# Patient Record
Sex: Female | Born: 1940 | Race: White | Hispanic: No | Smoking: Never smoker
Health system: Southern US, Community
[De-identification: ages and names within clinical notes are randomized; demographics above are authoritative.]

## PROBLEM LIST (undated history)

## (undated) DIAGNOSIS — I5032 Chronic diastolic (congestive) heart failure: Secondary | ICD-10-CM

## (undated) DIAGNOSIS — J189 Pneumonia, unspecified organism: Secondary | ICD-10-CM

## (undated) DIAGNOSIS — R002 Palpitations: Secondary | ICD-10-CM

## (undated) DIAGNOSIS — I4891 Unspecified atrial fibrillation: Secondary | ICD-10-CM

## (undated) DIAGNOSIS — I499 Cardiac arrhythmia, unspecified: Secondary | ICD-10-CM

## (undated) DIAGNOSIS — K219 Gastro-esophageal reflux disease without esophagitis: Secondary | ICD-10-CM

## (undated) DIAGNOSIS — I10 Essential (primary) hypertension: Secondary | ICD-10-CM

## (undated) DIAGNOSIS — M719 Bursopathy, unspecified: Secondary | ICD-10-CM

## (undated) DIAGNOSIS — S060X9A Concussion with loss of consciousness of unspecified duration, initial encounter: Secondary | ICD-10-CM

## (undated) DIAGNOSIS — F419 Anxiety disorder, unspecified: Secondary | ICD-10-CM

## (undated) HISTORY — DX: Anxiety disorder, unspecified: F41.9

## (undated) HISTORY — DX: Concussion with loss of consciousness of unspecified duration, initial encounter: S06.0X9A

## (undated) HISTORY — DX: Bursopathy, unspecified: M71.9

## (undated) HISTORY — PX: ESOPHAGOGASTRODUODENOSCOPY: SHX1529

## (undated) HISTORY — DX: Gastro-esophageal reflux disease without esophagitis: K21.9

## (undated) HISTORY — DX: Cardiac arrhythmia, unspecified: I49.9

## (undated) HISTORY — DX: Pneumonia, unspecified organism: J18.9

## (undated) HISTORY — PX: COLONOSCOPY: SHX174

## (undated) HISTORY — DX: Essential (primary) hypertension: I10

## (undated) HISTORY — DX: Palpitations: R00.2

## (undated) HISTORY — DX: Unspecified atrial fibrillation: I48.91

## (undated) HISTORY — PX: NOSE SURGERY: SHX723

---

## 1968-05-23 HISTORY — PX: HERNIA REPAIR: SHX51

## 1978-05-23 HISTORY — PX: KNEE SURGERY: SHX244

## 1993-05-23 HISTORY — PX: TOTAL ABDOMINAL HYSTERECTOMY: SHX209

## 1998-02-19 ENCOUNTER — Other Ambulatory Visit: Admission: RE | Admit: 1998-02-19 | Discharge: 1998-02-19 | Payer: Self-pay | Admitting: *Deleted

## 1998-05-23 HISTORY — PX: SHOULDER SURGERY: SHX246

## 1999-03-18 ENCOUNTER — Other Ambulatory Visit: Admission: RE | Admit: 1999-03-18 | Discharge: 1999-03-18 | Payer: Self-pay | Admitting: *Deleted

## 1999-12-08 ENCOUNTER — Encounter: Admission: RE | Admit: 1999-12-08 | Discharge: 2000-01-27 | Payer: Self-pay | Admitting: Orthopedic Surgery

## 2000-04-28 ENCOUNTER — Other Ambulatory Visit: Admission: RE | Admit: 2000-04-28 | Discharge: 2000-04-28 | Payer: Self-pay | Admitting: *Deleted

## 2001-04-30 ENCOUNTER — Other Ambulatory Visit: Admission: RE | Admit: 2001-04-30 | Discharge: 2001-04-30 | Payer: Self-pay | Admitting: Obstetrics and Gynecology

## 2002-05-21 ENCOUNTER — Other Ambulatory Visit: Admission: RE | Admit: 2002-05-21 | Discharge: 2002-05-21 | Payer: Self-pay | Admitting: Obstetrics and Gynecology

## 2003-06-03 ENCOUNTER — Other Ambulatory Visit: Admission: RE | Admit: 2003-06-03 | Discharge: 2003-06-03 | Payer: Self-pay | Admitting: Obstetrics and Gynecology

## 2006-05-23 HISTORY — PX: ABDOMINAL SURGERY: SHX537

## 2006-09-22 ENCOUNTER — Ambulatory Visit: Payer: Self-pay | Admitting: Internal Medicine

## 2006-11-06 ENCOUNTER — Ambulatory Visit (HOSPITAL_COMMUNITY): Admission: RE | Admit: 2006-11-06 | Discharge: 2006-11-06 | Payer: Self-pay | Admitting: *Deleted

## 2007-07-29 ENCOUNTER — Other Ambulatory Visit: Payer: Self-pay

## 2007-07-29 ENCOUNTER — Inpatient Hospital Stay: Payer: Self-pay | Admitting: Internal Medicine

## 2007-08-14 ENCOUNTER — Ambulatory Visit: Payer: Self-pay | Admitting: Internal Medicine

## 2007-10-08 ENCOUNTER — Ambulatory Visit: Payer: Self-pay | Admitting: Pain Medicine

## 2007-10-17 ENCOUNTER — Ambulatory Visit: Payer: Self-pay | Admitting: Pain Medicine

## 2007-10-25 ENCOUNTER — Ambulatory Visit: Payer: Self-pay | Admitting: Internal Medicine

## 2007-11-15 ENCOUNTER — Ambulatory Visit: Payer: Self-pay | Admitting: Pain Medicine

## 2007-11-19 ENCOUNTER — Ambulatory Visit: Payer: Self-pay | Admitting: Pain Medicine

## 2007-11-22 ENCOUNTER — Ambulatory Visit: Payer: Self-pay | Admitting: Surgery

## 2007-11-22 ENCOUNTER — Other Ambulatory Visit: Payer: Self-pay

## 2009-11-18 ENCOUNTER — Ambulatory Visit: Payer: Self-pay | Admitting: Internal Medicine

## 2010-01-21 ENCOUNTER — Ambulatory Visit: Payer: Self-pay | Admitting: Gastroenterology

## 2010-05-23 DIAGNOSIS — J189 Pneumonia, unspecified organism: Secondary | ICD-10-CM

## 2010-05-23 HISTORY — DX: Pneumonia, unspecified organism: J18.9

## 2010-08-20 ENCOUNTER — Inpatient Hospital Stay (HOSPITAL_COMMUNITY)
Admission: EM | Admit: 2010-08-20 | Discharge: 2010-08-22 | DRG: 310 | Disposition: A | Discharge status: Home / Self Care | Payer: Medicare Other | Attending: Cardiology | Admitting: Cardiology

## 2010-08-20 ENCOUNTER — Emergency Department (HOSPITAL_COMMUNITY): Payer: Medicare Other

## 2010-08-20 DIAGNOSIS — Z888 Allergy status to other drugs, medicaments and biological substances status: Secondary | ICD-10-CM

## 2010-08-20 DIAGNOSIS — R002 Palpitations: Secondary | ICD-10-CM

## 2010-08-20 DIAGNOSIS — I4891 Unspecified atrial fibrillation: Principal | ICD-10-CM | POA: Diagnosis present

## 2010-08-20 DIAGNOSIS — K219 Gastro-esophageal reflux disease without esophagitis: Secondary | ICD-10-CM | POA: Diagnosis present

## 2010-08-20 DIAGNOSIS — Z880 Allergy status to penicillin: Secondary | ICD-10-CM

## 2010-08-20 DIAGNOSIS — M76899 Other specified enthesopathies of unspecified lower limb, excluding foot: Secondary | ICD-10-CM | POA: Diagnosis present

## 2010-08-20 DIAGNOSIS — F411 Generalized anxiety disorder: Secondary | ICD-10-CM | POA: Diagnosis present

## 2010-08-20 DIAGNOSIS — I10 Essential (primary) hypertension: Secondary | ICD-10-CM | POA: Diagnosis present

## 2010-08-20 LAB — DIFFERENTIAL
Basophils Absolute: 0 10*3/uL (ref 0.0–0.1)
Basophils Relative: 0 % (ref 0–1)
Neutro Abs: 3.6 10*3/uL (ref 1.7–7.7)
Neutrophils Relative %: 72 % (ref 43–77)

## 2010-08-20 LAB — COMPREHENSIVE METABOLIC PANEL
ALT: 17 U/L (ref 0–35)
AST: 21 U/L (ref 0–37)
Albumin: 3.1 g/dL — ABNORMAL LOW (ref 3.5–5.2)
CO2: 23 mEq/L (ref 19–32)
Calcium: 8.6 mg/dL (ref 8.4–10.5)
Chloride: 107 mEq/L (ref 96–112)
GFR calc Af Amer: >60 mL/min (ref >60)
GFR calc non Af Amer: >60 mL/min (ref >60)
Sodium: 141 mEq/L (ref 135–145)
Total Bilirubin: 0.8 mg/dL (ref 0.3–1.2)

## 2010-08-20 LAB — CARDIAC PANEL(CRET KIN+CKTOT+MB+TROPI)
Relative Index: INVALID (ref 0.0–2.5)
Troponin I: 0.02 ng/mL (ref 0.00–0.06)

## 2010-08-20 LAB — URINALYSIS, ROUTINE W REFLEX MICROSCOPIC
Bilirubin Urine: NEGATIVE
Hgb urine dipstick: NEGATIVE
Ketones, ur: NEGATIVE mg/dL
Nitrite: NEGATIVE
pH: 5.5 (ref 5.0–8.0)

## 2010-08-20 LAB — CK TOTAL AND CKMB (NOT AT ARMC)
CK, MB: 2 ng/mL (ref 0.3–4.0)
CK, MB: 2.2 ng/mL (ref 0.3–4.0)
Relative Index: INVALID (ref 0.0–2.5)
Total CK: 50 U/L (ref 7–177)
Total CK: 54 U/L (ref 7–177)

## 2010-08-20 LAB — PROTIME-INR
INR: 0.99 (ref 0.00–1.49)
Prothrombin Time: 13.3 seconds (ref 11.6–15.2)

## 2010-08-20 LAB — CBC
Hemoglobin: 12.1 g/dL (ref 12.0–15.0)
Platelets: 182 10*3/uL (ref 150–400)
RBC: 3.85 MIL/uL — ABNORMAL LOW (ref 3.87–5.11)

## 2010-08-20 LAB — TROPONIN I
Troponin I: 0.02 ng/mL (ref 0.00–0.06)
Troponin I: 0.02 ng/mL (ref 0.00–0.06)

## 2010-08-20 LAB — POCT CARDIAC MARKERS
CKMB, poc: <1 ng/mL — ABNORMAL LOW (ref 1.0–8.0)
Myoglobin, poc: 49.7 ng/mL (ref 12–200)
Troponin i, poc: <0.05 ng/mL (ref 0.00–0.09)

## 2010-08-20 LAB — TSH: TSH: 2.6 u[IU]/mL (ref 0.350–4.500)

## 2010-08-21 DIAGNOSIS — I4891 Unspecified atrial fibrillation: Secondary | ICD-10-CM

## 2010-08-21 DIAGNOSIS — I359 Nonrheumatic aortic valve disorder, unspecified: Secondary | ICD-10-CM

## 2010-08-21 LAB — CBC
Hemoglobin: 11.8 g/dL — ABNORMAL LOW (ref 12.0–15.0)
MCH: 31.2 pg (ref 26.0–34.0)
MCH: 32.4 pg (ref 26.0–34.0)
MCV: 91.9 fL (ref 78.0–100.0)
Platelets: 202 10*3/uL (ref 150–400)
Platelets: 194 10*3/uL (ref 150–400)
RBC: 3.78 MIL/uL — ABNORMAL LOW (ref 3.87–5.11)
RDW: 13 % (ref 11.5–15.5)

## 2010-08-21 LAB — CARDIAC PANEL(CRET KIN+CKTOT+MB+TROPI)
CK, MB: 1.5 ng/mL (ref 0.3–4.0)
CK, MB: 1.5 ng/mL (ref 0.3–4.0)
Relative Index: INVALID (ref 0.0–2.5)
Total CK: 50 U/L (ref 7–177)

## 2010-08-21 LAB — BASIC METABOLIC PANEL
CO2: 23 mEq/L (ref 19–32)
Calcium: 8.7 mg/dL (ref 8.4–10.5)
Chloride: 106 mEq/L (ref 96–112)
GFR calc Af Amer: >60 mL/min (ref >60)
Glucose, Bld: 117 mg/dL — ABNORMAL HIGH (ref 70–99)
Potassium: 3.3 mEq/L — ABNORMAL LOW (ref 3.5–5.1)
Sodium: 137 mEq/L (ref 135–145)

## 2010-08-21 LAB — LIPID PANEL
Cholesterol: 222 mg/dL — ABNORMAL HIGH (ref 0–200)
HDL: 42 mg/dL (ref >39)
Triglycerides: 172 mg/dL — ABNORMAL HIGH (ref <150)

## 2010-08-21 LAB — HEPARIN LEVEL (UNFRACTIONATED)
Heparin Unfractionated: 0.56 IU/mL (ref 0.30–0.70)
Heparin Unfractionated: <0.1 IU/mL — ABNORMAL LOW (ref 0.30–0.70)

## 2010-08-22 LAB — CBC
HCT: 34.6 % — ABNORMAL LOW (ref 36.0–46.0)
MCV: 91.8 fL (ref 78.0–100.0)
Platelets: 207 10*3/uL (ref 150–400)
RBC: 3.77 MIL/uL — ABNORMAL LOW (ref 3.87–5.11)
WBC: 4.1 10*3/uL (ref 4.0–10.5)

## 2010-08-30 ENCOUNTER — Ambulatory Visit (HOSPITAL_COMMUNITY): Payer: Medicare Other | Attending: Cardiology | Admitting: Radiology

## 2010-08-30 VITALS — Ht 66.0 in | Wt 176.0 lb

## 2010-08-30 DIAGNOSIS — R0989 Other specified symptoms and signs involving the circulatory and respiratory systems: Secondary | ICD-10-CM | POA: Insufficient documentation

## 2010-08-30 DIAGNOSIS — R0609 Other forms of dyspnea: Secondary | ICD-10-CM | POA: Insufficient documentation

## 2010-08-30 DIAGNOSIS — R079 Chest pain, unspecified: Secondary | ICD-10-CM

## 2010-08-30 DIAGNOSIS — I10 Essential (primary) hypertension: Secondary | ICD-10-CM

## 2010-08-30 DIAGNOSIS — I359 Nonrheumatic aortic valve disorder, unspecified: Secondary | ICD-10-CM | POA: Insufficient documentation

## 2010-08-30 DIAGNOSIS — R002 Palpitations: Secondary | ICD-10-CM | POA: Insufficient documentation

## 2010-08-30 DIAGNOSIS — R0602 Shortness of breath: Secondary | ICD-10-CM | POA: Insufficient documentation

## 2010-08-30 DIAGNOSIS — I4891 Unspecified atrial fibrillation: Secondary | ICD-10-CM | POA: Insufficient documentation

## 2010-08-30 MED ORDER — TECHNETIUM TC 99M TETROFOSMIN IV KIT
11.0000 | PACK | Freq: Once | INTRAVENOUS | Status: AC | PRN
  Administered 2010-08-30: 11 via INTRAVENOUS

## 2010-08-30 MED ORDER — TECHNETIUM TC 99M TETROFOSMIN IV KIT
33.0000 | PACK | Freq: Once | INTRAVENOUS | Status: AC | PRN
  Administered 2010-08-30: 33 via INTRAVENOUS

## 2010-08-30 NOTE — Progress Notes (Addendum)
[facility] SITE 3 NUCLEAR MED [address] [place] [address] [phone]  Cardiology Nuclear Med Study  [name] is a 70 y.o. female [id number] 26-Mar-1941   Nuclear Med Background Indication for Stress Test:  Evaluation for Ischemia, 08/22/10 Post Hospital: Afib with RVR History:  ~10 yrs ago MPS:([facility]) [place] per patient. Cardiac Risk Factors: Hypertension, Adopted  Symptoms:  Chest Pain (last date of chest discomfort :none since discharge), Diaphoresis, Dizziness, DOE, Fatigue, Palpitations and Rapid HR   Nuclear Pre-Procedure Caffeine/Decaff Intake:  None NPO After: 7:00am   Lungs:  Clear IV 0.9% NS with Angio Cath:  20g  IV Site: R Hand  IV Started by:  Stanton Kidney, EMT-P  Chest Size (in):  40 Cup Size: C  Height: 5\' 6"  (1.676 m)  Weight:  176 lb (79.833 kg)  BMI:  Body mass index is 28.41 kg/(m^2). Tech Comments:  Diltiazem held this a.m.    Nuclear Med Study 1 or 2 day study: 1 day  Stress Test Type:  Stress  Reading MD: Cassell Clement, MD  Order Authorizing Provider:  Marca Ancona, MD  Resting Radionuclide: Technetium 47m Tetrofosmin  Resting Radionuclide Dose: 11.0 mCi       Stress Radionuclide:  Technetium 71m Tetrofosmin  Stress Radionuclide Dose: 33.0 mCi           Stress Protocol Rest HR: 92 Stress HR: 133  Rest BP: 159/89 Stress BP: 224/78  Exercise Time (min): 4:01 METS: 5.8   Predicted Max HR: 152 bpm % Max HR: 87.5 bpm Rate Pressure Product: 21308   Dose of Adenosine (mg):  n/a Dose of Lexiscan: n/a mg  Dose of Atropine (mg): n/a Dose of Dobutamine: n/a mcg/kg/min (at max HR)  Stress Test Technologist: Rea College, CMA-N  Nuclear Technologist:  Doyne Keel, CNMT     Rest Procedure:  Myocardial perfusion imaging was performed at rest 45 minutes following the intravenous administration of Technetium 6m Tetrofosmin. Rest ECG: No acute changes.  Stress Procedure:  The patient exercised for 4:01 on the treadmill  utilizing the Bruce protocol.  The patient stopped due to fatigue and denied any chest pain.  There were no diagnostic ST-T wave changes, only an isolated PVC.  She did have a hypertensive response to exercise, 224/78.  Technetium 33m Tetrofosmin was injected at peak exercise and myocardial perfusion imaging was performed after a brief delay. Stress ECG: No significant ST segment change suggestive of ischemia.  QPS Raw Data Images:  Normal; no motion artifact; normal heart/lung ratio. Stress Images:  Normal homogeneous uptake in all areas of the myocardium. Rest Images:  Normal homogeneous uptake in all areas of the myocardium. Subtraction (SDS):  No evidence of ischemia. Transient Ischemic Dilatation (Normal <1.22):  0.95 Lung/Heart Ratio (Normal <0.45):  0.33  Quantitative Gated Spect Images QGS EDV:  80 ml QGS ESV:  25 ml QGS cine images:  NL LV Function; NL Wall Motion QGS EF: 69%  Impression Exercise Capacity:  Poor exercise capacity. BP Response:  Hypertensive blood pressure response. Clinical Symptoms:  No chest pain. ECG Impression:  No significant ST segment change suggestive of ischemia. Comparison with Prior Nuclear Study: No images to compare  Overall Impression:  Low risk stress nuclear study.  No ischemia.  Hypertensive response to exercise.    Signed by Cassell Clement    Hypertensive BP response, otherwise normal study.  Please inform patient.

## 2010-08-31 ENCOUNTER — Telehealth: Payer: Self-pay | Admitting: Cardiology

## 2010-08-31 NOTE — Telephone Encounter (Signed)
Pt was without Cardizem for 24 hours due to a stress test.  Pt feels like she is in A-fib.  Please advise.

## 2010-08-31 NOTE — Progress Notes (Signed)
ROUTED TO DR. MCLEAN.Falecha Clark ° ° °

## 2010-08-31 NOTE — Telephone Encounter (Signed)
Spoke to pt, she had stress test yesterday at [facility] and she had stopped Cardizem for procedure (she takes 360mg  qd). Pt has restarted this at 0730 this am. When pt called her BP/HR was 143/84, 102. Few minutes later 162/90, 99. And @ 0930 168/89, HR 86. Pt states she is sitting down and feels ok at this time. HR seem controlled now, pt has no other symptoms. Pt does take Pradaxa 150mg  b.i.d. Notified pt to monitor BP/HR today and symptoms and if she feels she needs to come in for EKG for incr HR or if she feels worse. Pt ok with this, her husband is with her today and he will drive her here if she wants nurse visit. Otherwise, pt is back on Cardizem and continues to take Pradaxa and will follow up as needed.

## 2010-09-01 NOTE — Progress Notes (Signed)
Pt has an appointment 09/03/10 with Dr Shirlee Latch in the [facility]

## 2010-09-01 NOTE — H&P (Signed)
NAMEAniyla, [name]                   ACCOUNT NO.:  [id number]  MEDICAL RECORD NO.:  [id number]           PATIENT TYPE:  I  LOCATION:  2604                         FACILITY:  [facility]  PHYSICIAN:  Colleen Can. Deborah Chalk, M.D.DATE OF BIRTH:  16-Jan-1941  DATE OF ADMISSION:  08/20/2010 DATE OF DISCHARGE:                             HISTORY & PHYSICAL   PRIMARY CARE PHYSICIAN:  [name], MD at [facility] in [place].  PRIMARY CARDIOLOGIST:  None, she is scheduled to see Dr. Marca Ancona and requests him as her cardiologist because he sees her husband.  CHIEF COMPLAINT:  Palpitations.  HISTORY OF PRESENT ILLNESS:  [name] is a 70 year old female with no history of coronary artery disease.  She has a long history of palpitations managed by Dr. Hyacinth Meeker.  She had sudden onset of tachy palpitations with minimal activity on Tuesday August 17, 2010.  She took her blood pressure on her home machine and her heart rate was 154.  Her blood pressure was maintained.  She took an extra metoprolol as Dr. Hyacinth Meeker had previously instructed her to do when she felt these palpitations and she was able to sleep.  The next morning she was very tired.  She had some dyspnea on exertion and nausea, but no vomiting. She at no point did she have any chest pain.  That p.m., she felt better and Thursday she felt well and felt like she was able to do her normal activities.  She had some occasional brief palpitations, but does not remember feeling any sustained arrhythmia.  Last p.m., she took her evening medication at 9:20 p.m. which was a little bit late.  A short time later, she had a sudden increase in her heart rate.  She was lying in bed and had to sit up.  She had some pain between her scapula.  She also had a headache and some blurred vision, but no unilateral weakness. She had dyspnea on exertion as well.  She took a home dose of Xanax and another small dose of metoprolol.  Her heart rate was once again  in the 150s.  The symptoms were more pronounced than they had been a couple of nights ago and she was unable to sleep.  The pain in her back concerned her.  The headache was also severe.  She called EMS and was transported to the emergency room.  She was in Afib with RVR.  Her heart rate is currently better controlled on Cardizem at 10 mg, but is still frequently in the 120s and 130s.  When she received medication to control her heart rate, both her headache and the pain in her back improved.  She is much more comfortable now.  PAST MEDICAL HISTORY: 1. History of palpitations "lifelong." 2. Hypertension. 3. Gastroesophageal reflux disease. 4. Anxiety. 5. Recent history of upper respiratory infection and pleurisy treated     with 4 rounds of steroids and 3 rounds of antibiotics.  SURGICAL HISTORY:  She is status post hernia repair with mesh, shoulder surgery, hysterectomy, right knee surgery, left-sided hernia repair and EGD with colonoscopy.  ALLERGIES:  She is allergic or intolerant to PENICILLIN, NORVASC, VICODIN AND BETADINE.  CURRENT MEDICATIONS: 1. Prozac 20 mg a day. 2. Toprol-XL 50 mg b.i.d. 3. Doxazosin 4 mg half tablet nightly. 4. Restoril 15 mg nightly. 5. Mirapex 0.25 mg t.i.d. p.r.n. for restless legs. 6. Imitrex 50 mg p.r.n. migraines. 7. Toviaz 4 mg daily. 8. Premarin 0.65 mg daily. 9. Tegretol 100 mg b.i.d. p.r.n. for trigeminal neuralgia. 10.Astelin nasal spray daily. 11.Etodolac 400 mg b.i.d. 12.Nasonex daily. 13.Calcium plus D 2 tablets daily. 14.Vitamin D 1000 mg a day. 15.Xanax dose unknown one and a half to 1 tablet p.r.n. 16.Nexium 40 mg b.i.d. 17.Vitamin C plus iron daily.  SOCIAL HISTORY:  She lives in [place] with her husband who has significant cardiac issues and walks with a walker.  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  She denies any history of alcohol, tobacco or  drug abuse.  FAMILY HISTORY:  She was adopted and both of her adopted parents are deceased.  She has no information on any of her biological relatives.  REVIEW OF SYSTEMS:  Her upper respiratory infection, she feels is resolved.  She has been working on getting her strength back.  She has a small amount of vision loss.  She has problems with anxiety and stress dealing with her husband.  She has joint pains from bursitis in both hips and some other musculoskeletal discomforts.  She has regular reflux symptoms, but has never had melena.  She still coughs occasionally, but the cough is nonproductive.  Full 14-point review of systems is otherwise negative except as stated in the HPI.  PHYSICAL EXAMINATION:  VITAL SIGNS:  Temperature 97.0, blood pressure 142/89, pulse 104, respiratory rate 20, O2 saturation 94% on room air. GENERAL:  She is a well-developed elderly white female in no acute distress. HEENT:  Normal. NECK:  There is no lymphadenopathy, thyromegaly, bruit or JVD noted. CVA:  Heart is rapid and irregular in rate and rhythm with an S1-S2 and no significant murmur, rub, or gallop is noted.  Distal pulses are intact in all 4 extremities. LUNGS:  She has rales in both bases.  There are few crackles as well. SKIN:  No rashes or lesions are noted. ABDOMEN:  Soft, nontender with active bowel sounds. EXTREMITIES:  There is no cyanosis, clubbing or edema noted. MUSCULOSKELETAL:  There is no joint deformity or effusions and no spine or CVA tenderness. NEURO:  She is alert and oriented, with cranial nerves II-XII grossly intact.  Chest x-ray shows enlargement of cardiac silhouette.  She has emphysematous changes with minimal right basilar atelectasis, but lung base opacity is possibly pneumonia and follow up exams are recommended until resolution to exclude underlying abnormalities/mass.  EKG, is atrial fibrillation.  RVR with rate 126 with no acute ischemic changes.  LABORATORY  VALUES:  Hemoglobin 12.1, hematocrit 35.5, WBC 5.0, platelets 182,000, INR 0.99.  Sodium 141, potassium 2.9, chloride 107, CO2 23, BUN 14, creatinine 0.86, glucose 112, total protein 5.8, albumin 3.1.  Other CMET values within normal limits.  CK-MB and troponin I as well as point- of-care markers were negative x1.  BNP 450.  IMPRESSION:  [name] was seen today by Dr. Deborah Chalk, the patient evaluated and the data reviewed. 1. New onset atrial fibrillation with a long history of palpitations     on a beta blocker.  The possible onset was 3 days ago with a 24-     hour period  of a possibly regular rhythm in between.  This suggests     that she is at low risk for cerebrovascular accident.  On review of     telemetry, she has converted to sinus rhythm for 1 or 2 beats with     a prolonged delay for sinus node recovery on telemetry, but almost     immediately went back into atrial fibrillation.  PLAN: 1. Anticoagulate with heparin. 2. IV Cardizem.  If she does not go into sinus rhythm spontaneously,     she will need an antiarrhythmic followed by a TEE, cardioversion on     Monday.  If she goes back and forth on telemetry, she will just     need an antiarrhythmic.  The duration of Afib is short, but symptoms are concerning.  Her potassium has been replaced with 10 mEq IV and 40 p.o.  We will check a 2-D echocardiogram. Her BNP is somewhat elevated, but her blood pressure on the Cardizem drip is slightly less than 100 systolic.  Her heart rate is not optimally controlled at this time.  Her respiratory status will be followed closely and she may need diuresis at some point.     Theodore Demark, PA-C   ______________________________ Colleen Can. Deborah Chalk, M.D.    RB/MEDQ  D:  08/20/2010  T:  08/21/2010  Job:  [id number]  Electronically Signed by Theodore Demark PA-C on 08/25/2010 01:24:36 PM Electronically Signed by Roger Shelter M.D. on 09/01/2010 11:18:51 AM

## 2010-09-03 ENCOUNTER — Ambulatory Visit (INDEPENDENT_AMBULATORY_CARE_PROVIDER_SITE_OTHER): Payer: Medicare Other | Admitting: Cardiology

## 2010-09-03 ENCOUNTER — Encounter: Payer: Self-pay | Admitting: Cardiology

## 2010-09-03 DIAGNOSIS — Z0000 Encounter for general adult medical examination without abnormal findings: Secondary | ICD-10-CM

## 2010-09-03 DIAGNOSIS — I359 Nonrheumatic aortic valve disorder, unspecified: Secondary | ICD-10-CM

## 2010-09-03 DIAGNOSIS — R0600 Dyspnea, unspecified: Secondary | ICD-10-CM

## 2010-09-03 DIAGNOSIS — R0609 Other forms of dyspnea: Secondary | ICD-10-CM

## 2010-09-03 DIAGNOSIS — R002 Palpitations: Secondary | ICD-10-CM

## 2010-09-03 DIAGNOSIS — I4891 Unspecified atrial fibrillation: Secondary | ICD-10-CM | POA: Insufficient documentation

## 2010-09-03 DIAGNOSIS — I351 Nonrheumatic aortic (valve) insufficiency: Secondary | ICD-10-CM

## 2010-09-03 DIAGNOSIS — R0602 Shortness of breath: Secondary | ICD-10-CM

## 2010-09-03 LAB — BASIC METABOLIC PANEL
CO2: 20 mEq/L (ref 19–32)
Chloride: 102 mEq/L (ref 96–112)
Creat: 0.9 mg/dL (ref 0.40–1.20)
Potassium: 4.4 mEq/L (ref 3.5–5.3)

## 2010-09-03 MED ORDER — FLECAINIDE ACETATE 50 MG PO TABS: 50.0000 mg | ORAL_TABLET | Freq: Two times a day (BID) | ORAL | Status: DC

## 2010-09-03 NOTE — Patient Instructions (Signed)
Decrease Flecainide to 50mg  twice daily. We will call you with lab results that were drawn today. (BMP, BNP, CBC)  Your physician has recommended that you wear a holter monitor. Holter monitors are medical devices that record the heart's electrical activity. Doctors most often use these monitors to diagnose arrhythmias. Arrhythmias are problems with the speed or rhythm of the heartbeat. The monitor is a small, portable device. You can wear one while you do your normal daily activities. This is usually used to diagnose what is causing palpitations/syncope (passing out). Please bring monitor and diary back on Monday to our office in [place].  Your physician recommends that you schedule a follow-up appointment in: 2 weeks Your appt will be at [facility] in [place] on 09/23/10 @ 11:45 AM.

## 2010-09-03 NOTE — Progress Notes (Signed)
PCP: Dr. Hyacinth Meeker  70 yo with history of HTN and new-onset symptomatic atrial fibrillation with rapid ventricular response presents to establish outpatient cardiology care after recent hospitalization at [facility].  Patient has had a history of periodic palpitations and apparently has had some documented SVT.  In 3/12, she woke up 2 nights in a row with a fast, irregular heart rhythm.  The first night, heart rate and rhythm returned to normal fairly quickly.  The second night, the heart rate remained fast and she developed pain between her shoulder blades.  EMS was called and she went to [facility], where she was noted to be in atrial fibrillation with rapid reponse.  She eventually converted back to NSR after receiving diltiazem.  She was started on Pradaxa and flecainide.    Since discharge from the hospital, patient has been fatigued when she tries to exert herself.  She is short of breath after walking 200 feet and has to stop.  She is winded with steps.  She has to sleep on 2 pillows due to orthopnea-type symptoms (did not have to do this in the past).  This is all a significant change compared to prior to the atrial fibrillation episode.  She thinks that she has gained at least 5 lbs in the last couple of weeks.  She has not felt fast atrial fibrillation but does not that her heart occasionally beats "hard."  No chest pain.  She had an ETT-myoview after starting on flecainide on which her exercise tolerance was below average.  There was not, however, any evidence for ischemia. Finally, she has had a right-sided headache since she came home from the hospital (persistent, not like prior migraines).    ECG: NSR, IVCD (QRS 128 msec), prolonged QTc (500 msec).  Of noted, QRS duration prior to starting flecainide was 96 msec.   Labs (3/12): HCT 34.6, FOBT negative, TSH normal, cardiac enzymes negative x 3, K 3.3, creatinine 0.85  PMH:  1. Palpitations/SVT 2. Atrial fibrillation: First definitive diagnosis  in 3/12.  Patient presented to [facility] with atrial fibrillation/rapid ventricular response.  She was very symptomatic.  She converted to NSR after receiving diltiazem.  She was started on Pradaxa and flecainide.  - Echo (3/12): EF 60-65%, mild LV hypertrophy, trileaflet aortic valve with mild aortic insufficiency, normal RV size and systolic function.  3. ETT-myoview (3/12): 4'1", stopped due to fatigue (no chest pain).  Hypertensive BP response.  EF 69%.  No evidence for ischemia or infarction by ECG or perfusion images.  4. HTN: Intolerant of Norvasc.  5. GERD 6. Anxiety 7. H/o hysterectomy 8. Migraines 9. Cholecystectomy 10. Fibrocystic breast disease 11. Aortic insufficiency: Mild on 3/12 echo  SH: Lives in [place].  Married to [employer].  Retired from [employer].  Nonsmoker.   FH: Adopted, does not know details of biological family.  ROS: All systems reviewed and negative except as per HPI.   Current Outpatient Prescriptions  Medication Sig Dispense Refill  . ALPRAZolam (XANAX) 0.25 MG tablet 0.25 mg. Take 1/2 to 1 tablet as needed       . Ascorbic Acid (VITAMIN C) 100 MG tablet Take 500 mg by mouth daily.        Marland Kitchen azelastine (ASTELIN) 137 MCG/SPRAY nasal spray 1 spray by Nasal route 2 (two) times daily. Use in each nostril as directed       . Calcium Carbonate-Vitamin D (CALCIUM + D PO) Take two tablets daily       .  Cholecalciferol (VITAMIN D3) 1000 UNITS CAPS Take by mouth 1 dose over 46 hours.        . dabigatran (PRADAXA) 150 MG CAPS Take 150 mg by mouth every 12 (twelve) hours.        Marland Kitchen diltiazem (CARDIZEM CD) 360 MG 24 hr capsule Take 360 mg by mouth daily.        Marland Kitchen doxazosin (CARDURA) 4 MG tablet 4 mg. Take 1/2 tablet nightly       . esomeprazole (NEXIUM) 40 MG capsule 40 mg. Take two capsules daily       . estrogens, conjugated, (PREMARIN) 0.625 MG tablet Take 0.625 mg by mouth daily. Take daily for 21 days then do not take for 7 days.       .  ferrous sulfate 325 (65 FE) MG tablet Take 325 mg by mouth daily with breakfast.        . Fesoterodine Fumarate (TOVIAZ PO) Take by mouth 1 dose over 46 hours.        . flecainide (TAMBOCOR) 50 MG tablet Take 1 tablet (50 mg total) by mouth 2 (two) times daily.  60 tablet  6  . FLUoxetine (PROZAC) 20 MG capsule 20 mg. Take one tablet every other day.      Marland Kitchen FLUoxetine (PROZAC) 40 MG capsule 40 mg. Take one tablet every other day.       . fluticasone (FLONASE) 50 MCG/ACT nasal spray 2 sprays by Nasal route daily.        Marland Kitchen IRON-VITAMIN C PO Take by mouth 1 dose over 46 hours.        . pramipexole (MIRAPEX) 0.25 MG tablet 0.25 mg. Take one tablet every am, one tablet in the afternoon and two tablets nightly.       . SUMAtriptan (IMITREX) 50 MG tablet Take 50 mg by mouth every 2 (two) hours as needed.        . temazepam (RESTORIL) 15 MG capsule Take 15 mg by mouth at bedtime.       Marland Kitchen DISCONTD: flecainide (TAMBOCOR) 100 MG tablet Take 100 mg by mouth 2 (two) times daily.        Marland Kitchen etodolac (LODINE) 400 MG tablet Take 400 mg by mouth 2 (two) times daily.          BP 110/72  Pulse 89  Ht 5\' 6"  (1.676 m)  Wt 177 lb (80.287 kg)  BMI 28.57 kg/m2 General: NAD Neck: No JVD, no thyromegaly or thyroid nodule.  Lungs: Clear to auscultation bilaterally with normal respiratory effort. CV: Nondisplaced PMI.  Heart regular S1/S2, no S3/S4, 1/6 SEM RUSB (no diastolic murmur).  No peripheral edema.  No carotid bruit.  Normal pedal pulses.  Abdomen: Soft, nontender, no hepatosplenomegaly, no distention.  Skin: Intact without lesions or rashes.  Neurologic: Alert and oriented x 3.  Psych: Normal affect. Extremities: No clubbing or cyanosis.  HEENT: Normal.

## 2010-09-03 NOTE — Assessment & Plan Note (Signed)
Patient had recent episode of symptomatic atrial fibrillation with rapid ventricular response.  She is now maintaining NSR on flecainide.  She does, however, have some QRS widening (> 25% compared to baseline) that is likely due to flecainide.  There was no evidence for ischemia or infarction on myovew, of note.  Flecainide is a negative inotrope and can cause symptoms of fatigue and dyspnea.  [name] does not appear significantly volume overloaded today, so I wonder if some of her symptoms may not be flecainide effect.  - Decrease flecainide to 50 mg bid from 100 mg bid.   - 48 hour holter to assess for breakthrough atrial fibrillation given sensation of "heart pounding" though her heart rate has not been elevated.  - Continue diltiazem CD (need nodal blockade while on flecainide).  - Continue dabigatran.  - If patient is unable to tolerate flecainide, would consider inpatient admission for dofetilide or sotalol (given her severe symptoms would like to maintain NSR).  If anti-arrhythmics fail, atrial fibrillation ablation would be a reasonable option.  - She will followup in 2 wks and I will repeat an ECG on lower dose of flecainide to see if QRS narrows.

## 2010-09-03 NOTE — Assessment & Plan Note (Signed)
Patient has new exertional dyspnea compared to prior to admission with atrial fibrillation/RVR.  LV systolic function was normal on echo.  She does not appear volume overloaded on exam.  I am concerned, as above, that the negative inotrope effects of flecainide may be contributing to her symptoms.  As above, I will cut back on flecainide to 50 mg bid and have her followup in 2 weeks.  I will check a BNP today.

## 2010-09-03 NOTE — Assessment & Plan Note (Signed)
Only mild on echo from 3/12.  Continue to monitor.  No significant diastolic murmur.  

## 2010-09-04 LAB — CBC WITH DIFFERENTIAL/PLATELET
Eosinophils Absolute: 0.1 10*3/uL (ref 0.0–0.7)
Eosinophils Relative: 3 % (ref 0–5)
HCT: 37.5 % (ref 36.0–46.0)
Lymphocytes Relative: 12 % (ref 12–46)
Lymphs Abs: 0.6 10*3/uL — ABNORMAL LOW (ref 0.7–4.0)
MCH: 31.8 pg (ref 26.0–34.0)
MCV: 96.9 fL (ref 78.0–100.0)
Monocytes Absolute: 0.5 10*3/uL (ref 0.1–1.0)
Monocytes Relative: 9 % (ref 3–12)
Platelets: 297 10*3/uL (ref 150–400)
RBC: 3.87 MIL/uL (ref 3.87–5.11)

## 2010-09-04 LAB — BRAIN NATRIURETIC PEPTIDE: Brain Natriuretic Peptide: 32.6 pg/mL (ref 0.0–100.0)

## 2010-09-06 NOTE — Discharge Summary (Signed)
NAMEHeatherly, Stenner Johnetta                   ACCOUNT NO.:  [id number]  MEDICAL RECORD NO.:  [id number]           PATIENT TYPE:  I  LOCATION:  2604                         FACILITY:  [facility]  PHYSICIAN:  Hillis Range, MD       DATE OF BIRTH:  [birth year]  DATE OF ADMISSION:  08/20/2010 DATE OF DISCHARGE:  08/22/2010                              DISCHARGE SUMMARY   PRIMARY CARDIOLOGIST:  Marca Ancona, MD  PRIMARY CARE PHYSICIAN:  Dr. [name] [facility], [place]).  DISCHARGE DIAGNOSIS:  Atrial fibrillation with rapid ventricular response. A.  Spontaneously converted on IV Cardizem, initiated on Pradaxa 150 p.o. b.i.d., flecainide 100 mg p.o. b.i.d., discharged in normal sinus rhythm. B.  GXT Myoview to be set up in 1-2 weeks at [facility], followup with Dr. Shirlee Latch in [place] 1-2 weeks after Myoview.  SECONDARY DIAGNOSES: 1. History of palpitations (lifelong). 2. Hypertension. 3. Gastroesophageal reflux disease. 4. Anxiety. 5. Recent history of upper respiratory infection and pleurisy treated     with 4 rounds of steroids and 3 rounds of antibiotics.  PAST SURGICAL HISTORY: 1. Hernia repair with mesh. 2. Shoulder surgery. 3. Hysterectomy. 4. Right knee surgery. 5. Hernia repair. 6. EGD. 7. Colonoscopy.  ALLERGIES AND INTOLERANCES: 1. IODINE (blisters). 2. PENICILLIN (hives). 3. CARBAMAZEPINE/TEGRETOL (nausea). 4. BIAXIN (metallic taste). 5. AMLODIPINE/NORVASC (hives). 6. VICODIN/LORTAB/NORCO (hives).  PROCEDURES: 1. EKG, August 20, 2010:  Atrial fibrillation, 126 bpm, no acute     changes. 2. Chest x-ray, August 20, 2010:  Enlargement of cardiac silhouette.     Emphysematous changes with minimal right basilar atelectasis.  Left     lung base opacity, question pneumonia. 3. A 2-D echocardiogram August 21, 2010:  Results pending. 4. EKG, August 21, 2010:  Sinus rhythm with first-degree AV block, RSR     prime pattern suggestive of  right ventricular conduction delay,     minimal criteria for left ventricular hypertrophy, left axis     deviation, minimal T-wave inversion in V1 and lead III, no     significant Q-waves, PR 226, QRS 104, and QTc of 464.  HISTORY OF PRESENT ILLNESS:  Ms. Windsor is a 70 year old female with the above-noted medical history.  She has had palpitations intermittently throughout the course of her life.  These have been followed by her primary care physician.  On August 17, 2010, she had one of her episodes and checked her blood pressure and heart rate on her home monitor. Heart rate was 154, so she took an extra metoprolol as she has been instructed to do by her PCP, Dr. Hyacinth Meeker.  Symptoms resolved and she was in her usual state of health until the evening of August 19, 2010, when she had another episode of tachy palpitations this time associated with mid upper back discomfort and headache.  She was unable to sleep throughout the course of the evening secondary to her discomfort; and as she was concerned about her new headache and back discomfort along with her old palpitations, she called EMS.  She was brought to [facility] ED and was noted to  be in AFib with RVR with the heart rate in 150s.  She was started on IV Cardizem with significant improvement in rate control, but heart rates still in the 120s to 130s at times when evaluated by Cardiology.  Her symptoms of headache and back pain had significantly improved after rate reduction.  HOSPITAL COURSE:  The patient was admitted and spontaneously converted overnight.  She was seen in the morning of August 21, 2010, by electrophysiologist, Dr. Hillis Range.  Dr. Johney Frame switched her from IV Cardizem to p.o. Cardizem 360 mg p.o. daily and discontinued her beta blockade therapy.  He also initiated her on Pradaxa 150 mg p.o. b.i.d. for thromboembolic risk reduction.  Of note, the patient was anticoagulated on heparin at the time of her admission.  This  was discontinued with finding of normal sinus rhythm and initiation of Pradaxa.  The patient had a 2-D echocardiogram completed on August 21, 2010.  She was maintaining normal sinus rhythm on the morning of August 22, 2010, and she was deemed stable for discharge.  Prior to discharge, Dr. Hillis Range initiated flecainide 100 mg p.o. b.i.d.  The patient has no known history of coronary artery disease, but will have exercise Myoview completed in [facility] in the next 1- 2 weeks given recent initiation on flecainide.  She will then follow up with Dr. Shirlee Latch in [place] 1-2 weeks post Myoview.  At the time of her discharge, the patient received her new medication list, prescriptions, followup instructions, and all questions and concerns addressed prior to leaving the hospital.  DISCHARGE LABORATORIES:  WBC is 4.1, HGB 11.8, HCT 34.6, WBC differential on admission was within normal limits.  Protime 13.3, INR 0.99.  Sodium 137, potassium 3.3, chloride 106, BUN 16, creatinine 0.85, bicarb 23, glucose 117, calcium 8.7, magnesium 1.62.  Liver function tests within normal limits.  Total protein 5.8, albumin 3.1, hemoglobin A1c 5.7%.  Point-of-care markers were negative x1 and 5 full sets of cardiac enzymes were drawn, all of which were negative.  Total cholesterol 222, triglycerides 172, HDL 42, LDL 146, total cholesterol/HDL ratio 5.3.  TSH 2.60.  Urinalysis within normal limits. Fecal occult blood negative.  MRSA positive by PCR (nasal swab).  FOLLOWUP PLANS AND APPOINTMENTS:  Please see hospital course.  DISCHARGE MEDICATIONS: 1. Dabigatran/Pradaxa 50 mg 1 tablet p.o. q.12 h. 2. Diltiazem 360 mg 1 tablet p.o. daily. 3. Ferrous sulfate 325 mg 1 tablet p.o. daily. 4. Flecainide 100 mg 1 tablet p.o. b.i.d. 5. Alprazolam 0.25 mg 1/2 to 1 tablet p.o. daily p.r.n. 6. Astelin 137 mcg 2 puffs nasally daily. 7. Calcium carbonate with vitamin D 2 tablets p.o. daily. 8.  Doxazosin 4 mg 1/2 tablet p.o. nightly. 9. Etodolac 400 mg 1 tablet p.o. b.i.d. 10.Flonase 2 puffs nightly nasally p.r.n. 11.Mirapex 0.25 mg tablets 1 tablet q.a.m., 2 tablets nightly, and 1     tablet in the afternoon p.r.n. 12.Nexium 40 mg 2 capsules p.o. q.a.m. 13.Premarin/conjugated estrogens, USP 0.625 mg 1 tablet p.o. daily. 14.Prozac 20 mg 1 capsule p.o. daily. 15.Sumatriptan 50 mg 1 tablet p.o. daily p.r.n. 16.Temazepam 15 mg 1 capsule p.o. nightly. 17.Vitamin D3 1000 units 1 tablet p.o. daily. 18.Vitamin C/iron over the counter 1 tablet p.o. daily. 19.Toviaz 1 tablet p.o. daily.  DURATION OF DISCHARGE ENCOUNTER:  Including physician time was 35 minutes.     Jarrett Ables, PAC   ______________________________ Hillis Range, MD    MS/MEDQ  D:  08/22/2010  T:  08/23/2010  Job:  [id number]  cc:   Dawson Bills, MD  Electronically Signed by Jarrett Ables PAC on 08/31/2010 10:39:10 AM Electronically Signed by Hillis Range MD on 09/06/2010 09:20:56 AM

## 2010-09-07 ENCOUNTER — Encounter: Payer: Self-pay | Admitting: *Deleted

## 2010-09-20 ENCOUNTER — Encounter: Payer: Self-pay | Admitting: Cardiology

## 2010-09-22 ENCOUNTER — Encounter: Payer: Self-pay | Admitting: Cardiology

## 2010-09-23 ENCOUNTER — Encounter: Payer: Self-pay | Admitting: Cardiology

## 2010-09-23 ENCOUNTER — Ambulatory Visit (INDEPENDENT_AMBULATORY_CARE_PROVIDER_SITE_OTHER): Payer: Medicare Other | Admitting: Cardiology

## 2010-09-23 VITALS — BP 120/70 | HR 60 | Ht 66.0 in | Wt 176.0 lb

## 2010-09-23 DIAGNOSIS — I351 Nonrheumatic aortic (valve) insufficiency: Secondary | ICD-10-CM

## 2010-09-23 DIAGNOSIS — I4891 Unspecified atrial fibrillation: Secondary | ICD-10-CM

## 2010-09-23 DIAGNOSIS — I359 Nonrheumatic aortic valve disorder, unspecified: Secondary | ICD-10-CM

## 2010-09-23 NOTE — Patient Instructions (Addendum)
Do not take Flecainide tonight or tomorrow morning. I will call you tomorrow afternoon to see how you are feeling and to let you know if you should take Flecainide. [name]  Dr Shirlee Latch has recommended a 3 week event monitor.  Schedule an appointment to see Dr Shirlee Latch in 3 weeks.

## 2010-09-23 NOTE — Progress Notes (Signed)
PCP: Dr. Hyacinth Meeker   70 yo with history of HTN and new-onset symptomatic atrial fibrillation with rapid ventricular response presents for cardiology followup after recent hospitalization at [facility]. Patient has had a history of periodic palpitations and apparently has had some documented SVT. In 3/12, she woke up 2 nights in a row with a fast, irregular heart rhythm. The first night, heart rate and rhythm returned to normal fairly quickly. The second night, the heart rate remained fast and she developed pain between her shoulder blades. EMS was called and she went to [facility], where she was noted to be in atrial fibrillation with rapid reponse. She eventually converted back to NSR after receiving diltiazem. She was started on Pradaxa and flecainide.  She had an ETT-myoview after starting on flecainide on which her exercise tolerance was below average. There was not, however, any evidence for ischemia.   After coming home, she was fatigued with exertion.  She also noted episodes where her heart would beat "hard."  She developed a chronic headache.  When I saw her for the first time in the office, her QRS and QT intervals were prolonged, and I thought that some of her symptomatology may have to do with flecainide.  I cut the flecainide in half, and today ECG has corrected.  Her headache resolved.  However, she still gets very bothersome episodes where she will feel her heart pound.  HR is in the 100s when these episodes occur.  Sometimes they can wake her up from sleep.  Sometimes they occur with exertion.  When they occur with exertion, she feels week and has to sit down.   She is not having significant dyspnea with exertion and no chest pain.  Given concern for breakthrough atrial fibrillation, she had a 48 hour holter that was fairly unremarkable with no recurrent atrial fibrillation.  Interestingly, she thinks she had the heart pounding while the monitor was on but no significant arrhythmia was seen.   ECG  (09/03/10): NSR, IVCD (QRS 128 msec), prolonged QTc (500 msec). Of noted, QRS duration prior to starting flecainide was 96 msec.  ECG (09/23/10, lower dose flecainide): NSR, normal (QTc and QRS now normal duration).    Labs (3/12): HCT 34.6, FOBT negative, TSH normal, cardiac enzymes negative x 3, K 3.3, creatinine 0.85, BNP 32  PMH:  1. Palpitations/SVT  2. Atrial fibrillation: First definitive diagnosis in 3/12. Patient presented to [facility] with atrial fibrillation/rapid ventricular response. She was very symptomatic. She converted to NSR after receiving diltiazem. She was started on Pradaxa and flecainide.  - Echo (3/12): EF 60-65%, mild LV hypertrophy, trileaflet aortic valve with mild aortic insufficiency, normal RV size and systolic function.  - 48 hour holter (4/12) due to palpitations showed no atrial fibrillation and a few PACs and blocked PACs.  No signiifcant arrhythmia.  3. ETT-myoview (3/12): 4'1", stopped due to fatigue (no chest pain). Hypertensive BP response. EF 69%. No evidence for ischemia or infarction by ECG or perfusion images.  4. HTN: Intolerant of Norvasc.  5. GERD  6. Anxiety  7. H/o hysterectomy  8. Migraines  9. Cholecystectomy  10. Fibrocystic breast disease  11. Aortic insufficiency: Mild on 3/12 echo   SH: Lives in [place]. Married to [employer]. Retired from [employer]. Nonsmoker.   FH: Adopted, does not know details of biological family.   ROS: All systems reviewed and negative except as per HPI.   Current Outpatient Prescriptions  Medication Sig Dispense Refill  .  ALPRAZolam (XANAX) 0.25 MG tablet 0.25 mg. Take 1/2 to 1 tablet as needed       . Ascorbic Acid (VITAMIN C) 100 MG tablet Take 500 mg by mouth daily.        Marland Kitchen azelastine (ASTELIN) 137 MCG/SPRAY nasal spray 1 spray by Nasal route 2 (two) times daily. Use in each nostril as directed       . Calcium Carbonate-Vitamin D (CALCIUM + D PO) Take two tablets daily       .  Cholecalciferol (VITAMIN D3) 1000 UNITS CAPS Take by mouth 1 dose over 46 hours.        . dabigatran (PRADAXA) 150 MG CAPS Take 150 mg by mouth every 12 (twelve) hours.        Marland Kitchen diltiazem (CARDIZEM CD) 360 MG 24 hr capsule Take 360 mg by mouth daily.        Marland Kitchen doxazosin (CARDURA) 4 MG tablet 4 mg. Take 1/2 tablet nightly       . esomeprazole (NEXIUM) 40 MG capsule 40 mg. Take two capsules daily       . estrogens, conjugated, (PREMARIN) 0.625 MG tablet Take 0.625 mg by mouth daily. Take daily for 21 days then do not take for 7 days.       . ferrous sulfate 325 (65 FE) MG tablet Take 325 mg by mouth daily with breakfast.        . Fesoterodine Fumarate (TOVIAZ PO) Take by mouth 1 dose over 46 hours.        . flecainide (TAMBOCOR) 50 MG tablet Take 1 tablet (50 mg total) by mouth 2 (two) times daily.  60 tablet  6  . FLUoxetine (PROZAC) 20 MG capsule 20 mg. Take one tablet every other day.      Marland Kitchen FLUoxetine (PROZAC) 40 MG capsule 40 mg. Take one tablet every other day.       . fluticasone (FLONASE) 50 MCG/ACT nasal spray 2 sprays by Nasal route daily.        Marland Kitchen IRON-VITAMIN C PO Take by mouth 1 dose over 46 hours.        . pramipexole (MIRAPEX) 0.25 MG tablet 0.25 mg. Take one tablet every am, one tablet in the afternoon and two tablets nightly.       . SUMAtriptan (IMITREX) 50 MG tablet Take 50 mg by mouth every 2 (two) hours as needed.        . temazepam (RESTORIL) 15 MG capsule Take 15 mg by mouth at bedtime.         BP 120/70  Pulse 60  Ht 5\' 6"  (1.676 m)  Wt 176 lb (79.833 kg)  BMI 28.41 kg/m2 General: NAD  Neck: No JVD, no thyromegaly or thyroid nodule.  Lungs: Clear to auscultation bilaterally with normal respiratory effort.  CV: Nondisplaced PMI. Heart regular S1/S2, no S3/S4, 1/6 SEM RUSB (no diastolic murmur). No peripheral edema. No carotid bruit. Normal pedal pulses.  Abdomen: Soft, nontender, no hepatosplenomegaly, no distention.  Neurologic: Alert and oriented x 3.  Psych: Normal  affect.  Extremities: No clubbing or cyanosis.

## 2010-09-24 ENCOUNTER — Telehealth: Payer: Self-pay | Admitting: *Deleted

## 2010-09-24 NOTE — Telephone Encounter (Signed)
I called pt to see how she had been feeling since  holding Flecainide. Pt states she feels better overall. Her energy level is better. She has had rare short lasting palpitations, not as frequent as in the past. I reviewed with Dr Shirlee Latch. He recommended pt stay off Flecainide and get monitor as ordered. Pt aware of Dr Alford Highland recommendations.

## 2010-09-24 NOTE — Assessment & Plan Note (Signed)
Patient is on flecainide 50 bid (decreased dose), diltiazem CD, and Pradaxa.  She has been having troubling palpitations/heart pounding daily.  Possibilities for this include breakthrough atrial fibrillation versus a flecainide side effect.  Decreasing the flecainide dose did not really help this.  I am going to have her stop flecainide for a day.  If this resolves the symptoms, she will stay off flecainide and I will get a 2 week event monitor to see if she has breakthrough atrial fibrillation.  If she does not get relief from holding flecainide, she will go back on it and I will go forward with a 2 week event monitor to see if there is breakthrough atrial fibrillation.  If the patient has breakthrough atrial fibrillation on or off flecainide, would think about atrial fibrillation ablation given her significant symptoms when in atrial fibrillation. She will continue diltiazem and Pradaxa. She will followup in 3 weeks after monitor is done.

## 2010-09-24 NOTE — Assessment & Plan Note (Signed)
Only mild on echo from 3/12.  Continue to monitor.  No significant diastolic murmur.  

## 2010-09-30 ENCOUNTER — Encounter: Payer: Self-pay | Admitting: Cardiology

## 2010-10-05 NOTE — Op Note (Signed)
[name], [name]                   ACCOUNT NO.:  [id number]   MEDICAL RECORD NO.:  [id number]          PATIENT TYPE:  AMB   LOCATION:  DAY                          FACILITY:  [facility]   PHYSICIAN:  Alfonse Ras, MD   DATE OF BIRTH:  10-31-1940   DATE OF PROCEDURE:  11/06/2006  DATE OF DISCHARGE:                               OPERATIVE REPORT   PREOPERATIVE DIAGNOSIS:  Questionable incisional hernia.   POSTOPERATIVE DIAGNOSIS:  Abdominal wall diastasis.   PROCEDURE:  Repair of abdominal wall diastasis with Prolene mesh.   ANESTHESIA:  General.   SURGEON:  Alfonse Ras, MD.   ASSISTANT:  No assistant.   DESCRIPTION OF PROCEDURE:  The patient was taken to the operating room,  placed in supine position.  After adequate anesthesia was induced using  endotracheal tube, the abdomen was prepped and draped in normal sterile  fashion.  Using a transverse previous vein and sterile scar, I dissected  down onto the fascia.  Flaps were created in all directions.  There was  weakness of the abdominal wall but no evidence of hernia sac of  herniation.  I decided to place an onlay mesh over this and tack it with  a running 2-0 Prolene in all directions.  There was weakness on both the  right and left sides but again no evidence of hernia.  There was no  primary fascia to really close since this was mostly in the external  oblique area.  Subcutaneous closure was accomplished using the 2-0  Vicryl suture.  Skin was closed with 3-0 Monocryl.  Steri-Strips and  sterile dressings were applied.  The patient tolerated the procedure  well went to PACU in good condition.      Alfonse Ras, MD  Electronically Signed     KRE/MEDQ  D:  11/06/2006  T:  11/06/2006  Job:  [id number]

## 2010-10-08 ENCOUNTER — Encounter: Payer: Self-pay | Admitting: Cardiology

## 2010-10-08 ENCOUNTER — Ambulatory Visit (INDEPENDENT_AMBULATORY_CARE_PROVIDER_SITE_OTHER): Payer: Medicare Other | Admitting: Cardiology

## 2010-10-08 DIAGNOSIS — I359 Nonrheumatic aortic valve disorder, unspecified: Secondary | ICD-10-CM

## 2010-10-08 DIAGNOSIS — I351 Nonrheumatic aortic (valve) insufficiency: Secondary | ICD-10-CM

## 2010-10-08 DIAGNOSIS — I4891 Unspecified atrial fibrillation: Secondary | ICD-10-CM

## 2010-10-08 MED ORDER — METOPROLOL TARTRATE 25 MG PO TABS: 25.0000 mg | ORAL_TABLET | Freq: Two times a day (BID) | ORAL | Status: DC

## 2010-10-08 NOTE — Patient Instructions (Signed)
Stop Doxazosin. Start Metoprolol Tartrate 25mg  twice daily. Your physician recommends that you schedule a follow-up appointment in: 2 months

## 2010-10-10 NOTE — Progress Notes (Signed)
PCP: Dr. Hyacinth Meeker   70 yo with history of HTN and new-onset symptomatic atrial fibrillation with rapid ventricular response presents for cardiology followup after recent hospitalization at [facility]. Patient has had a history of periodic palpitations and apparently has had some documented SVT. In 3/12, she woke up 2 nights in a row with a fast, irregular heart rhythm. The first night, heart rate and rhythm returned to normal fairly quickly. The second night, the heart rate remained fast and she developed pain between her shoulder blades. EMS was called and she went to [facility], where she was noted to be in atrial fibrillation with rapid reponse. She eventually converted back to NSR after receiving diltiazem. She was started on Pradaxa and flecainide. She had an ETT-myoview after starting on flecainide on which her exercise tolerance was below average. There was not, however, any evidence for ischemia.   After coming home, she was fatigued with exertion. She also noted episodes where her heart would beat "hard." She developed a chronic headache. When I saw her for the first time in the office, her QRS and QT intervals were prolonged, and I thought that some of her symptomatology may have to do with flecainide. I cut the flecainide in half, and ECG corrected. Her headache lessened. However, she continued to get very bothersome episodes where she would feel her heart pound. HR was in the 100s when these episodes occurred. Sometimes they would wake her up from sleep. Sometimes they occurred with exertion.  She is not having significant dyspnea with exertion and no chest pain. Given concern for breakthrough atrial fibrillation, she had a 48 hour holter that was fairly unremarkable with no recurrent atrial fibrillation.   Given ongoing symptoms, I set her up for a 3 week event monitor.  I also had her stop flecainide.  Off flecainide, her palpitations have improved but have not completely resolved.  Her headaches  have completely resolved.  She has worn the event monitor for about 2 weeks now.  There has been no atrial fibrillation.  She has had PACs.  Symptomatic episodes have been linked by monitor to mild sinus tachycardia and to PACs.    ECG (09/03/10): NSR, IVCD (QRS 128 msec), prolonged QTc (500 msec). Of noted, QRS duration prior to starting flecainide was 96 msec.  ECG (09/23/10, lower dose flecainide): NSR, normal (QTc and QRS now normal duration).   Labs (3/12): HCT 34.6, FOBT negative, TSH normal, cardiac enzymes negative x 3, K 3.3, creatinine 0.85, BNP 32   PMH:  1. Palpitations/SVT  2. Atrial fibrillation: First definitive diagnosis in 3/12. Patient presented to [facility] with atrial fibrillation/rapid ventricular response. She was very symptomatic. She converted to NSR after receiving diltiazem. She was started on Pradaxa and flecainide.  - Echo (3/12): EF 60-65%, mild LV hypertrophy, trileaflet aortic valve with mild aortic insufficiency, normal RV size and systolic function.  - 48 hour holter (4/12) due to palpitations showed no atrial fibrillation and a few PACs and blocked PACs. No significant arrhythmia.  - 3 week event monitor (5/12): So far, no atrial fibrillation.  PACs and mild sinus tachycardia only noted.  - Flecainide stopped due to side effects.  3. ETT-myoview (3/12): 4'1", stopped due to fatigue (no chest pain). Hypertensive BP response. EF 69%. No evidence for ischemia or infarction by ECG or perfusion images.  4. HTN: Intolerant of Norvasc.  5. GERD  6. Anxiety  7. H/o hysterectomy  8. Migraines  9. Cholecystectomy  10. Fibrocystic breast  disease  11. Aortic insufficiency: Mild on 3/12 echo   SH: Lives in [place]. Married to [employer]. Retired from [employer]. Nonsmoker.   FH: Adopted, does not know details of biological family.   ROS: All systems reviewed and negative except as per HPI.   Current Outpatient Prescriptions  Medication Sig  Dispense Refill  . ALPRAZolam (XANAX) 0.25 MG tablet 0.25 mg. Take 1/2 to 1 tablet as needed       . Ascorbic Acid (VITAMIN C) 100 MG tablet Take 500 mg by mouth daily.        Marland Kitchen azelastine (ASTELIN) 137 MCG/SPRAY nasal spray 1 spray by Nasal route 2 (two) times daily. Use in each nostril as directed       . Calcium Carbonate-Vitamin D (CALCIUM + D PO) Take two tablets daily       . Cholecalciferol (VITAMIN D3) 1000 UNITS CAPS Take by mouth 1 dose over 46 hours.        . dabigatran (PRADAXA) 150 MG CAPS Take 150 mg by mouth every 12 (twelve) hours.        Marland Kitchen diltiazem (CARDIZEM CD) 360 MG 24 hr capsule Take 360 mg by mouth daily.        Marland Kitchen esomeprazole (NEXIUM) 40 MG capsule 40 mg. Take two capsules daily       . estrogens, conjugated, (PREMARIN) 0.625 MG tablet Take 0.625 mg by mouth daily. Take daily for 21 days then do not take for 7 days.       . Fesoterodine Fumarate (TOVIAZ PO) Take by mouth 1 dose over 46 hours.        Marland Kitchen FLUoxetine (PROZAC) 20 MG capsule 20 mg. Take one tablet every other day.      Marland Kitchen FLUoxetine (PROZAC) 40 MG capsule 40 mg. Take one tablet every other day.       . fluticasone (FLONASE) 50 MCG/ACT nasal spray 2 sprays by Nasal route daily.        . pramipexole (MIRAPEX) 0.25 MG tablet 0.25 mg. Take one tablet every am, one tablet in the afternoon and two tablets nightly.       . SUMAtriptan (IMITREX) 50 MG tablet Take 50 mg by mouth every 2 (two) hours as needed.        . temazepam (RESTORIL) 15 MG capsule Take 15 mg by mouth at bedtime.       . metoprolol tartrate (LOPRESSOR) 25 MG tablet Take 1 tablet (25 mg total) by mouth 2 (two) times daily.  60 tablet  6    BP 130/86  Pulse 84  Ht 5\' 6"  (1.676 m)  Wt 176 lb (79.833 kg)  BMI 28.41 kg/m2 General: NAD  Neck: No JVD, no thyromegaly or thyroid nodule.  Lungs: Clear to auscultation bilaterally with normal respiratory effort.  CV: Nondisplaced PMI. Heart regular S1/S2, no S3/S4, 1/6 SEM RUSB (no diastolic murmur). No  peripheral edema. No carotid bruit. Normal pedal pulses.  Abdomen: Soft, nontender, no hepatosplenomegaly, no distention.  Neurologic: Alert and oriented x 3.  Psych: Normal affect.  Extremities: No clubbing or cyanosis.

## 2010-10-10 NOTE — Assessment & Plan Note (Signed)
Only mild on echo from 3/12.  Continue to monitor.  No significant diastolic murmur.

## 2010-10-10 NOTE — Assessment & Plan Note (Signed)
Patient is now off flecainide, and palpitations have improved but not completely resolved.  She has had no recurrent atrial fibrillation noted so far on monitor.  I think that PACs are causing some of her symptomatology.  She will stay off flecainide.  I will have her continue Pradaxa and diltiazem CD.  I will have her stop doxazosin and will start her on metoprolol 25 mg bid to see if this helps suppress PACs and palpitations more effectively. She has 1 week to go on the event monitor.  Given the side effects she has noted with flecainide, if she has further atrial fibrillation, I would consider setting her up for an atrial fibrillation ablation.  I would also like her to get regular exercise: treadmill or walking outdoors.

## 2010-10-22 ENCOUNTER — Other Ambulatory Visit: Payer: Self-pay

## 2010-10-22 MED ORDER — DABIGATRAN ETEXILATE MESYLATE 150 MG PO CAPS: 150.0000 mg | ORAL_CAPSULE | Freq: Two times a day (BID) | ORAL | Status: DC

## 2010-10-22 NOTE — Telephone Encounter (Signed)
Refills for medications sent.

## 2010-11-30 ENCOUNTER — Inpatient Hospital Stay: Payer: Self-pay | Admitting: Internal Medicine

## 2010-12-02 ENCOUNTER — Encounter: Payer: Self-pay | Admitting: Cardiology

## 2010-12-14 ENCOUNTER — Ambulatory Visit (INDEPENDENT_AMBULATORY_CARE_PROVIDER_SITE_OTHER): Payer: Medicare Other | Admitting: Cardiology

## 2010-12-14 ENCOUNTER — Encounter: Payer: Self-pay | Admitting: Cardiology

## 2010-12-14 VITALS — BP 142/79 | HR 64 | Ht 66.0 in | Wt 177.0 lb

## 2010-12-14 DIAGNOSIS — I4891 Unspecified atrial fibrillation: Secondary | ICD-10-CM

## 2010-12-14 NOTE — Assessment & Plan Note (Addendum)
No evidence for recurrent atrial fibrillation.  Palpitations seem to have subsided.  She tolerated flecainide poorly and it has been stopped.  She will continue on Pradaxa, diltiazem CD and metoprolol.  If she has recurrent atrial fibrillation, would consider atrial fibrillation ablation given her significant symptoms with atrial fibrillation and side effects with antiarrhythmics.  She needs to get back into regular exercise.

## 2010-12-14 NOTE — Progress Notes (Signed)
PCP: Dr. Hyacinth Meeker   70 yo with history of HTN and new-onset symptomatic atrial fibrillation with rapid ventricular response presents for cardiology followup of paroxysmal atrial fibrillation.  Initially, she was started on flecainide.  She did not tolerate flecainide well and had ongoing significant palpitations.   Given ongoing symptoms, I set her up for a 3 week event monitor.  This showed occasional PACs but no atrial fibrillation. Flecainide was stopped due to side effects.    Patient had been doing well, but then developed pneumonia about 2 weeks ago and was admitted to [facility] 2-3 day where she was treated with antibiotics.  She has finished the antibiotics but still has some cough.  She feels weak.  However, she is able to walk on flat ground without significant dyspnea.  She is short of breath climbing a flight of steps.  No chest pain.  Palpitations are now rare.    ECG: NSR, borderline LVH  Labs (3/12): HCT 34.6, FOBT negative, TSH normal, cardiac enzymes negative x 3, K 3.3, creatinine 0.85, BNP 32  Labs (7/12): creatinine 0.85  PMH:  1. Palpitations/SVT  2. Atrial fibrillation: First definitive diagnosis in 3/12. Patient presented to [facility] with atrial fibrillation/rapid ventricular response. She was very symptomatic. She converted to NSR after receiving diltiazem. She was started on Pradaxa and flecainide.  - Echo (3/12): EF 60-65%, mild LV hypertrophy, trileaflet aortic valve with mild aortic insufficiency, normal RV size and systolic function.  - 48 hour holter (4/12) due to palpitations showed no atrial fibrillation and a few PACs and blocked PACs. No significant arrhythmia.  - 3 week event monitor (5/12): So far, no atrial fibrillation.  PACs and mild sinus tachycardia only noted.  - Flecainide stopped due to side effects.  3. ETT-myoview (3/12): 4'1", stopped due to fatigue (no chest pain). Hypertensive BP response. EF 69%. No evidence for ischemia or infarction by ECG or  perfusion images.  4. HTN: Intolerant of Norvasc.  5. GERD  6. Anxiety  7. H/o hysterectomy  8. Migraines  9. Cholecystectomy  10. Fibrocystic breast disease  11. Aortic insufficiency: Mild on 3/12 echo  12. Pneumonia 7/12.   SH: Lives in [place]. Married to [employer]. Retired from [employer]. Nonsmoker.   FH: Adopted, does not know details of biological family.   ROS: All systems reviewed and negative except as per HPI.   Current Outpatient Prescriptions  Medication Sig Dispense Refill  . ALPRAZolam (XANAX) 0.25 MG tablet 0.25 mg. Take 1/2 to 1 tablet as needed       . Ascorbic Acid (VITAMIN C) 100 MG tablet Take 500 mg by mouth daily.        Marland Kitchen azelastine (ASTELIN) 137 MCG/SPRAY nasal spray 1 spray by Nasal route 2 (two) times daily. Use in each nostril as directed       . Calcium Carbonate-Vitamin D (CALCIUM + D PO) Take two tablets daily       . carbamazepine (TEGRETOL XR) 200 MG 12 hr tablet Take 200 mg by mouth 2 (two) times daily. As needed       . Cholecalciferol (VITAMIN D3) 1000 UNITS CAPS Take by mouth 1 dose over 46 hours.        . dabigatran (PRADAXA) 150 MG CAPS Take 1 capsule (150 mg total) by mouth every 12 (twelve) hours.  60 capsule  6  . diltiazem (CARDIZEM CD) 360 MG 24 hr capsule Take 360 mg by mouth daily.        Marland Kitchen  esomeprazole (NEXIUM) 40 MG capsule 40 mg. Take two capsules daily       . estrogens, conjugated, (PREMARIN) 0.625 MG tablet Take 0.625 mg by mouth daily. Take daily for 21 days then do not take for 7 days.       . Fesoterodine Fumarate (TOVIAZ PO) Take by mouth 1 dose over 46 hours.        Marland Kitchen FLUoxetine (PROZAC) 20 MG capsule 20 mg. Take one tablet every other day.      Marland Kitchen FLUoxetine (PROZAC) 40 MG capsule 40 mg. Take one tablet every other day.       . fluticasone (FLONASE) 50 MCG/ACT nasal spray 2 sprays by Nasal route daily.        . metoprolol tartrate (LOPRESSOR) 25 MG tablet Take 1 tablet (25 mg total) by mouth 2 (two) times  daily.  60 tablet  6  . pramipexole (MIRAPEX) 0.25 MG tablet 0.25 mg. Take one tablet every am, one tablet in the afternoon and two tablets nightly.       . SUMAtriptan (IMITREX) 50 MG tablet Take 50 mg by mouth every 2 (two) hours as needed.        . temazepam (RESTORIL) 15 MG capsule Take 15 mg by mouth at bedtime.         BP 142/79  Pulse 64  Ht 5\' 6"  (1.676 m)  Wt 177 lb (80.287 kg)  BMI 28.57 kg/m2 General: NAD  Neck: No JVD, no thyromegaly or thyroid nodule.  Lungs: Clear to auscultation bilaterally with normal respiratory effort.  CV: Nondisplaced PMI. Heart regular S1/S2, no S3/S4, 1/6 SEM RUSB. No peripheral edema. No carotid bruit. Normal pedal pulses.  Abdomen: Soft, nontender, no hepatosplenomegaly, no distention.  Neurologic: Alert and oriented x 3.  Psych: Normal affect.  Extremities: No clubbing or cyanosis.

## 2010-12-14 NOTE — Patient Instructions (Signed)
Continue current medications. Follow up with Dr. Shirlee Latch in 4 months.

## 2011-01-25 ENCOUNTER — Telehealth: Payer: Self-pay | Admitting: *Deleted

## 2011-01-25 DIAGNOSIS — I4891 Unspecified atrial fibrillation: Secondary | ICD-10-CM

## 2011-01-25 NOTE — Telephone Encounter (Signed)
Pt called c/o HR incr 85-97 around lunch time, with symptoms of SOB, clammy, tremors --for 1 week. She does have h/o a fib, is on Pradaxa. She takes Metoprolol tartrate 25mg  BID. In AM feels fine, and after she takes her evening dose of metop her symptoms resolve. Her BP is 128/44 and is her normal she says. EKG from 7/24 was NSR. I advised she come in tomorrow AM for EKG to evaluate, and in the meantime will discuss with Dr. Shirlee Latch to see if any med changes are advised. Thanks.

## 2011-01-25 NOTE — Telephone Encounter (Signed)
Pt.notified

## 2011-01-25 NOTE — Telephone Encounter (Signed)
Agree with ECG, let's just see what it looks like.

## 2011-01-26 ENCOUNTER — Encounter: Payer: Self-pay | Admitting: *Deleted

## 2011-01-26 ENCOUNTER — Other Ambulatory Visit: Payer: Self-pay | Admitting: Cardiology

## 2011-01-26 ENCOUNTER — Ambulatory Visit (INDEPENDENT_AMBULATORY_CARE_PROVIDER_SITE_OTHER): Payer: Medicare Other | Admitting: *Deleted

## 2011-01-26 ENCOUNTER — Ambulatory Visit (INDEPENDENT_AMBULATORY_CARE_PROVIDER_SITE_OTHER): Payer: Medicare Other | Admitting: Cardiovascular Disease

## 2011-01-26 ENCOUNTER — Encounter: Payer: Self-pay | Admitting: Cardiovascular Disease

## 2011-01-26 VITALS — BP 150/88 | HR 81 | Wt 180.0 lb

## 2011-01-26 DIAGNOSIS — R635 Abnormal weight gain: Secondary | ICD-10-CM

## 2011-01-26 DIAGNOSIS — I4891 Unspecified atrial fibrillation: Secondary | ICD-10-CM

## 2011-01-26 DIAGNOSIS — R1907 Generalized intra-abdominal and pelvic swelling, mass and lump: Secondary | ICD-10-CM

## 2011-01-26 NOTE — Progress Notes (Signed)
Pt in for EKG to evaluate recent incr in HR (in afternoon) ~80s-90s. EKG shows NSR, HR 81. Pt w/ h/o a fib, she does take metoprolol tartrate 25mg  bid and has not missed any doses, she is also on Pradaxa. Upon assessment, pt has swelling/enlargement of abdomen, and puffy in the face. Pt has "felt bloated all over" for 1-2 weeks now and it has "been increasing." She has no swelling in legs/ankles, and does not report any. Also reports "sweating more" for 2-3 weeks. She has been "dieting for 1 month, cut out salts and all sweets, incr water and walking every day," however, has gained 5 lbs. Pt has not started any new meds, she did have one steroid shot in each hip for bursitis 2 months prior. She has been on Premarin tablets since her hysterectomy in "1959." Discussed the above with Dr. Shirlee Latch (pt's primary cardiologist, prior to DOD in office,) and he recommends pt be added on to schedule today and order CT of abdomen. Pt will see Dr. Mariah Milling this AM, and will discuss with him upon arrival.

## 2011-01-26 NOTE — Assessment & Plan Note (Signed)
Maintaining normal sinus rhythm. She takes metoprolol b.i.d. She does have occasional breakthrough rhythm early in the afternoon and I suggested she try metoprolol at noon if needed.

## 2011-01-26 NOTE — Patient Instructions (Signed)
You are doing well. No medication changes were made. Please call us if you have new issues that need to be addressed before your next appt.  Follow up  With Dr. Shirlee Latch

## 2011-01-26 NOTE — Progress Notes (Signed)
Patient ID: [name], female    DOB: 04-14-1941, 70 y.o.   MRN: [id number]  HPI Comments: 70 yo with history of HTN and  paroxysmal atrial fibrillation.  Initially, she was started on flecainide.  She did not tolerate flecainide well and had ongoing significant palpitations.    3 week event monitor showed occasional PACs but no atrial fibrillation. Flecainide was stopped due to side effects.    Patient had been doing well. She reports that she changed her diet approximately 2 months ago. Over the past month, she has had significant increase to her abdominal girth and facial size. She feels she has weight gain in her face and abdomen despite her best efforts to watch her diet. She did report having cortisone shots in both hips several months ago and uncertain if this is contributing. She's also had sweats, occasional tremor, sometimes in the daytime and nighttime. She is off Neurontin which was used for pain management.  Previous thyroid measurement in July was essentially normal. No other new medications. She's been on metoprolol for several months and does not feel her weight gain is secondary to the metoprolol.   ECG: Normal sinus rhythm with rate 81 beats per minute with no significant ST or T wave changes  PMH:   1. Palpitations/SVT   2. Atrial fibrillation: First definitive diagnosis in 3/12. Patient presented to [facility] with atrial fibrillation/rapid ventricular response. She was very symptomatic. She converted to NSR after receiving diltiazem. She was started on Pradaxa and flecainide.   - Echo (3/12): EF 60-65%, mild LV hypertrophy, trileaflet aortic valve with mild aortic insufficiency, normal RV size and systolic function.   - 48 hour holter (4/12) due to palpitations showed no atrial fibrillation and a few PACs and blocked PACs. No significant arrhythmia.   - 3 week event monitor (5/12): So far, no atrial fibrillation.  PACs and mild sinus tachycardia only noted.   - Flecainide  stopped due to side effects.   3. ETT-myoview (3/12): 4'1", stopped due to fatigue (no chest pain). Hypertensive BP response. EF 69%. No evidence for ischemia or infarction by ECG or perfusion images.   4. HTN: Intolerant of Norvasc.   5. GERD   6. Anxiety   7. H/o hysterectomy   8. Migraines   9. Cholecystectomy   10. Fibrocystic breast disease   11. Aortic insufficiency: Mild on 3/12 echo   12. Pneumonia 7/12.     Outpatient Encounter Prescriptions as of 01/26/2011  Medication Sig Dispense Refill  . ALPRAZolam (XANAX) 0.25 MG tablet 0.25 mg. Take 1/2 to 1 tablet as needed       . Ascorbic Acid (VITAMIN C) 100 MG tablet Take 500 mg by mouth daily.        Marland Kitchen azelastine (ASTELIN) 137 MCG/SPRAY nasal spray 1 spray by Nasal route 2 (two) times daily. Use in each nostril as directed       . Calcium Carbonate-Vitamin D (CALCIUM + D PO) Take two tablets daily       . carbamazepine (TEGRETOL XR) 200 MG 12 hr tablet Take 200 mg by mouth 2 (two) times daily. As needed       . Cholecalciferol (VITAMIN D3) 1000 UNITS CAPS Take by mouth 1 dose over 46 hours.        . dabigatran (PRADAXA) 150 MG CAPS Take 1 capsule (150 mg total) by mouth every 12 (twelve) hours.  60 capsule  6  . diltiazem (CARDIZEM CD) 360 MG 24  hr capsule Take 360 mg by mouth daily.        Marland Kitchen esomeprazole (NEXIUM) 40 MG capsule 40 mg. Take two capsules daily       . estrogens, conjugated, (PREMARIN) 0.625 MG tablet Take 0.625 mg by mouth daily. Take daily for 21 days then do not take for 7 days.       . Fesoterodine Fumarate (TOVIAZ PO) Take by mouth 1 dose over 46 hours.        Marland Kitchen FLUoxetine (PROZAC) 20 MG capsule 20 mg. Take one tablet every other day.      Marland Kitchen FLUoxetine (PROZAC) 40 MG capsule 40 mg. Take one tablet every other day.       . fluticasone (FLONASE) 50 MCG/ACT nasal spray 2 sprays by Nasal route daily.        . metoprolol tartrate (LOPRESSOR) 25 MG tablet Take 1 tablet (25 mg total) by mouth 2 (two) times daily.  60  tablet  6  . pramipexole (MIRAPEX) 0.25 MG tablet 0.25 mg. Take one tablet every am, one tablet in the afternoon and two tablets nightly.       . SUMAtriptan (IMITREX) 50 MG tablet Take 50 mg by mouth every 2 (two) hours as needed.        . temazepam (RESTORIL) 15 MG capsule Take 15 mg by mouth at bedtime.          Review of Systems  Constitutional: Positive for unexpected weight change.  HENT: Positive for facial swelling.   Eyes: Negative.   Respiratory: Negative.   Cardiovascular: Negative.   Gastrointestinal: Positive for abdominal distention.  Musculoskeletal: Negative.   Skin: Negative.   Neurological: Negative.   Hematological: Negative.   Psychiatric/Behavioral: Negative.   All other systems reviewed and are negative.    BP 150/88  Pulse 81  Ht 5\' 6"  (1.676 m)  Wt 180 lb (81.647 kg)  BMI 29.05 kg/m2  Physical Exam  Nursing note and vitals reviewed. Constitutional: She is oriented to person, place, and time. She appears well-developed and well-nourished.  HENT:  Head: Normocephalic.  Nose: Nose normal.  Mouth/Throat: Oropharynx is clear and moist.  Eyes: Conjunctivae are normal. Pupils are equal, round, and reactive to light.  Neck: Normal range of motion. Neck supple. No JVD present.  Cardiovascular: Normal rate, regular rhythm, S1 normal, S2 normal, normal heart sounds and intact distal pulses.  Exam reveals no gallop and no friction rub.   No murmur heard. Pulmonary/Chest: Effort normal and breath sounds normal. No respiratory distress. She has no wheezes. She has no rales. She exhibits no tenderness.  Abdominal: Soft. Bowel sounds are normal. She exhibits no distension. There is no tenderness.  Musculoskeletal: Normal range of motion. She exhibits no edema and no tenderness.  Lymphadenopathy:    She has no cervical adenopathy.  Neurological: She is alert and oriented to person, place, and time. Coordination normal.  Skin: Skin is warm and dry. No rash noted.  No erythema.  Psychiatric: She has a normal mood and affect. Her behavior is normal. Judgment and thought content normal.         Assessment and Plan

## 2011-01-26 NOTE — Assessment & Plan Note (Signed)
She is very perplexed by her weight gain. She reports at least 10 pounds or more weight gain over the past year, 3 pounds since her last clinic visit which was very recent. She has been watching her diet closely. She has put on weight around her face in the abdomen. Uncertain if this could be secondary to previous cortisone shots. I suggested this could be possible. I suggested she talk with Dr. Hyacinth Meeker. Previous TSH was essentially normal. Uncertain if there is other workup that needs to be performed. Does not appear to be cardiac. No signs of congestive heart failure, no lower extremity edema.

## 2011-01-31 ENCOUNTER — Encounter: Payer: Self-pay | Admitting: *Deleted

## 2011-02-01 ENCOUNTER — Ambulatory Visit: Payer: Self-pay | Admitting: Cardiology

## 2011-02-01 ENCOUNTER — Telehealth: Payer: Self-pay | Admitting: *Deleted

## 2011-02-01 DIAGNOSIS — R635 Abnormal weight gain: Secondary | ICD-10-CM

## 2011-02-01 DIAGNOSIS — R0600 Dyspnea, unspecified: Secondary | ICD-10-CM

## 2011-02-01 MED ORDER — METOPROLOL TARTRATE 25 MG PO TABS: 25.0000 mg | ORAL_TABLET | Freq: Two times a day (BID) | ORAL | Status: DC

## 2011-02-01 NOTE — Telephone Encounter (Signed)
Message copied by Annia Belt on Tue Feb 01, 2011  3:08 PM ------      Message from: Laurey Morale      Created: Tue Feb 01, 2011 12:15 PM       CT results noted, overall no concerning finding.  Would like to see her in the office soon. Can continue the torsemide.  Would like her to get a BMET and BNP in about 1 week on torsemide.

## 2011-02-01 NOTE — Telephone Encounter (Signed)
Pt requesting refill for Metoprolol, she has been taking PRN dose at lunchtime as instructed at last ov and states this has helped with incr HR and now needs new Rx. Notified pt will send in now.

## 2011-02-01 NOTE — Telephone Encounter (Signed)
Spoke to pt, notified of msg below. She will come in next week for BMP/BNP and will f/u with Dr. Shirlee Latch on 02/10/11.

## 2011-02-08 ENCOUNTER — Ambulatory Visit (INDEPENDENT_AMBULATORY_CARE_PROVIDER_SITE_OTHER): Payer: Medicare Other | Admitting: *Deleted

## 2011-02-08 DIAGNOSIS — R0600 Dyspnea, unspecified: Secondary | ICD-10-CM

## 2011-02-08 DIAGNOSIS — R635 Abnormal weight gain: Secondary | ICD-10-CM

## 2011-02-08 DIAGNOSIS — R0609 Other forms of dyspnea: Secondary | ICD-10-CM

## 2011-02-09 LAB — BRAIN NATRIURETIC PEPTIDE: Brain Natriuretic Peptide: 46.8 pg/mL (ref 0.0–100.0)

## 2011-02-09 LAB — BASIC METABOLIC PANEL
Calcium: 9.1 mg/dL (ref 8.4–10.5)
Potassium: 3.8 mEq/L (ref 3.5–5.3)
Sodium: 140 mEq/L (ref 135–145)

## 2011-02-10 ENCOUNTER — Encounter: Payer: Self-pay | Admitting: Cardiology

## 2011-02-10 ENCOUNTER — Ambulatory Visit (INDEPENDENT_AMBULATORY_CARE_PROVIDER_SITE_OTHER): Payer: Medicare Other | Admitting: Cardiology

## 2011-02-10 DIAGNOSIS — I4891 Unspecified atrial fibrillation: Secondary | ICD-10-CM

## 2011-02-10 DIAGNOSIS — R1907 Generalized intra-abdominal and pelvic swelling, mass and lump: Secondary | ICD-10-CM

## 2011-02-10 NOTE — Patient Instructions (Signed)
You are doing well. No medication changes were made. Please call us if you have new issues that need to be addressed before your next appt.  Your physician recommends that you schedule a follow-up appointment in: 6 months   

## 2011-02-13 NOTE — Progress Notes (Signed)
PCP: Dr. Hyacinth Meeker   70 yo with history of HTN and symptomatic atrial fibrillation with rapid ventricular response presents for cardiology followup of paroxysmal atrial fibrillation.  Initially, she was started on flecainide.  She did not tolerate flecainide well and had ongoing significant palpitations.  Given ongoing symptoms, I set her up for a 3 week event monitor.  This showed occasional PACs but no atrial fibrillation. Flecainide was stopped due to side effects.  Palpitations continue to be rare.    Patient developed bursitis in her hips this summer.  She had 3 steroid injections in July and August. Soon after this she developed severe abdomninal swelling as well as swelling in her face and hands.  The swelling resolved with torsemide use.  She had an abdominal/pelvic CT that showed no significant abnormalities.    Labs (3/12): HCT 34.6, FOBT negative, TSH normal, cardiac enzymes negative x 3, K 3.3, creatinine 0.85, BNP 32  Labs (7/12): creatinine 0.85, HCT 38, LDL 134, HDL 46 Labs (9/12): K 3.8, creatinine 0.76, BNP 47  PMH:  1. Palpitations/SVT  2. Atrial fibrillation: First definitive diagnosis in 3/12. Patient presented to [facility] with atrial fibrillation/rapid ventricular response. She was very symptomatic. She converted to NSR after receiving diltiazem. She was started on Pradaxa and flecainide.  - Echo (3/12): EF 60-65%, mild LV hypertrophy, trileaflet aortic valve with mild aortic insufficiency, normal RV size and systolic function.  - 48 hour holter (4/12) due to palpitations showed no atrial fibrillation and a few PACs and blocked PACs. No significant arrhythmia.  - 3 week event monitor (5/12): So far, no atrial fibrillation.  PACs and mild sinus tachycardia only noted.  - Flecainide stopped due to side effects.  3. ETT-myoview (3/12): 4'1", stopped due to fatigue (no chest pain). Hypertensive BP response. EF 69%. No evidence for ischemia or infarction by ECG or perfusion images.    4. HTN: Intolerant of Norvasc.  5. GERD  6. Anxiety  7. H/o hysterectomy  8. Migraines  9. Cholecystectomy  10. Fibrocystic breast disease  11. Aortic insufficiency: Mild on 3/12 echo  12. Pneumonia 7/12.  13. Swelling with steroids  SH: Lives in [place]. Married to [employer]. Retired from [employer]. Nonsmoker.   FH: Adopted, does not know details of biological family.   Current Outpatient Prescriptions  Medication Sig Dispense Refill  . ALPRAZolam (XANAX) 0.25 MG tablet 0.25 mg. Take 1/2 to 1 tablet as needed       . azelastine (ASTELIN) 137 MCG/SPRAY nasal spray 1 spray by Nasal route 2 (two) times daily. Use in each nostril as directed       . Calcium Carbonate-Vitamin D (CALCIUM + D PO) Take two tablets daily       . carbamazepine (TEGRETOL XR) 200 MG 12 hr tablet Take 200 mg by mouth 2 (two) times daily. As needed       . Cholecalciferol (VITAMIN D3) 1000 UNITS CAPS Take by mouth 1 dose over 46 hours.        . dabigatran (PRADAXA) 150 MG CAPS Take 1 capsule (150 mg total) by mouth every 12 (twelve) hours.  60 capsule  6  . diltiazem (CARDIZEM CD) 360 MG 24 hr capsule Take 360 mg by mouth daily.        Marland Kitchen esomeprazole (NEXIUM) 40 MG capsule 40 mg. Take two capsules daily       . estrogens, conjugated, (PREMARIN) 0.625 MG tablet Take 0.625 mg by mouth daily. Take daily for  21 days then do not take for 7 days.       . Fesoterodine Fumarate (TOVIAZ PO) Take by mouth 1 dose over 46 hours.        Marland Kitchen FLUoxetine (PROZAC) 20 MG capsule 20 mg. Take one tablet every other day.      Marland Kitchen FLUoxetine (PROZAC) 40 MG capsule 40 mg. Take one tablet every other day.       . fluticasone (FLONASE) 50 MCG/ACT nasal spray 2 sprays by Nasal route daily.        . metoprolol tartrate (LOPRESSOR) 25 MG tablet Take 1 tablet (25 mg total) by mouth 2 (two) times daily. And take 1 extra tablet at noon PRN for increased heart rate.  90 tablet  6  . pramipexole (MIRAPEX) 0.25 MG tablet 0.25  mg. Take one tablet every am, one tablet in the afternoon and two tablets nightly.       . SUMAtriptan (IMITREX) 50 MG tablet Take 50 mg by mouth every 2 (two) hours as needed.        . temazepam (RESTORIL) 15 MG capsule Take 15 mg by mouth at bedtime.         BP 114/67  Pulse 73  Resp 18  Ht 5\' 6"  (1.676 m)  Wt 178 lb 12.8 oz (81.103 kg)  BMI 28.86 kg/m2 General: NAD  Neck: No JVD, no thyromegaly or thyroid nodule.  Lungs: Clear to auscultation bilaterally with normal respiratory effort.  CV: Nondisplaced PMI. Heart regular S1/S2, no S3/S4, 1/6 SEM RUSB. No peripheral edema. No carotid bruit. Normal pedal pulses.  Abdomen: Soft, nontender, no hepatosplenomegaly, no distention.  Neurologic: Alert and oriented x 3.  Psych: Normal affect.  Extremities: No clubbing or cyanosis.

## 2011-02-14 DIAGNOSIS — R1907 Generalized intra-abdominal and pelvic swelling, mass and lump: Secondary | ICD-10-CM | POA: Insufficient documentation

## 2011-02-14 NOTE — Assessment & Plan Note (Signed)
Abdominal swelling and weight gain this summer was likely due to steroids. CT abdomen was unremarkable.  She has slowly improved as time has passed.

## 2011-02-14 NOTE — Assessment & Plan Note (Signed)
No evidence for recurrent atrial fibrillation.  Palpitations seem to have subsided.  She tolerated flecainide poorly and it has been stopped.  She will continue on Pradaxa, diltiazem CD and metoprolol.  If she has recurrent atrial fibrillation, would consider atrial fibrillation ablation given her significant symptoms with atrial fibrillation and side effects with antiarrhythmics.

## 2011-03-25 ENCOUNTER — Other Ambulatory Visit: Payer: Self-pay | Admitting: Cardiology

## 2011-03-25 ENCOUNTER — Telehealth: Payer: Self-pay | Admitting: *Deleted

## 2011-03-25 MED ORDER — DILTIAZEM HCL ER COATED BEADS 360 MG PO CP24: 360.0000 mg | ORAL_CAPSULE | Freq: Every day | ORAL | Status: DC

## 2011-03-25 NOTE — Telephone Encounter (Signed)
Requesting refill for diltiazem 360, says pharmacy should have faxed. Notified pt I will send in refill.

## 2011-03-30 ENCOUNTER — Ambulatory Visit: Payer: Medicare Other | Admitting: Cardiology

## 2011-05-20 ENCOUNTER — Other Ambulatory Visit: Payer: Self-pay | Admitting: Cardiology

## 2011-06-06 ENCOUNTER — Telehealth: Payer: Self-pay | Admitting: Cardiology

## 2011-06-06 DIAGNOSIS — R0602 Shortness of breath: Secondary | ICD-10-CM

## 2011-06-06 DIAGNOSIS — R609 Edema, unspecified: Secondary | ICD-10-CM

## 2011-06-06 NOTE — Telephone Encounter (Signed)
Pt calling states that she is having some swelling and a lot of trembles. She thinks it may be coming from some of her meds.

## 2011-06-06 NOTE — Telephone Encounter (Signed)
[name] is calling pt to schedule.

## 2011-06-06 NOTE — Telephone Encounter (Signed)
Actually, you have openings here tomorrow in clinic, do you want me to add pt on?

## 2011-06-06 NOTE — Telephone Encounter (Signed)
That would be fine 

## 2011-06-06 NOTE — Telephone Encounter (Signed)
Spoke to pt, she has had swelling in ankles only, "sometimes twice its normal size." Saw PCP, Dr. Hyacinth Meeker 1 mo ago and he advised she take Torsemide 10 QOD, no improvement so he incr her to 20mg  qod, pt became nauseated so she put herself on Torsemide 10mg  Every Day. She has been on this for about 1 month. Also wears [facility], watches fluids and salt intake. Swelling not much better and now she c/o trembling constantly. I have advised pt to hold Torsemide, and she will come in for labs BMP/BNP. Will forward msg to Dr. Shirlee Latch.

## 2011-06-07 ENCOUNTER — Encounter: Payer: Self-pay | Admitting: Cardiology

## 2011-06-07 ENCOUNTER — Ambulatory Visit (INDEPENDENT_AMBULATORY_CARE_PROVIDER_SITE_OTHER): Payer: Medicare Other | Admitting: Cardiology

## 2011-06-07 ENCOUNTER — Other Ambulatory Visit: Payer: Medicare Other | Admitting: *Deleted

## 2011-06-07 VITALS — BP 122/78 | HR 69 | Ht 66.0 in | Wt 180.0 lb

## 2011-06-07 DIAGNOSIS — R0602 Shortness of breath: Secondary | ICD-10-CM

## 2011-06-07 DIAGNOSIS — I4891 Unspecified atrial fibrillation: Secondary | ICD-10-CM

## 2011-06-07 DIAGNOSIS — R609 Edema, unspecified: Secondary | ICD-10-CM

## 2011-06-07 NOTE — Patient Instructions (Signed)
Continue current medications.   In 1 week: Your physician has requested that you have an echocardiogram. Echocardiography is a painless test that uses sound waves to create images of your heart. It provides your doctor with information about the size and shape of your heart and how well your heart's chambers and valves are working. This procedure takes approximately one hour. There are no restrictions for this procedure.   Your physician recommends that you schedule a follow-up appointment in: 4 months

## 2011-06-08 DIAGNOSIS — R609 Edema, unspecified: Secondary | ICD-10-CM | POA: Insufficient documentation

## 2011-06-08 LAB — BASIC METABOLIC PANEL
BUN: 19 mg/dL (ref 8–27)
Calcium: 9.5 mg/dL (ref 8.6–10.2)
GFR calc Af Amer: 92 mL/min/{1.73_m2} (ref >59)
GFR calc non Af Amer: 80 mL/min/{1.73_m2} (ref >59)
Glucose: 98 mg/dL (ref 65–99)
Potassium: 3.9 mmol/L (ref 3.5–5.2)

## 2011-06-08 LAB — BRAIN NATRIURETIC PEPTIDE: BNP: 89.1 pg/mL (ref 0.0–100.0)

## 2011-06-08 LAB — CBC WITH DIFFERENTIAL
Basophils Absolute: 0 10*3/uL (ref 0.0–0.2)
Basos: 0 % (ref 0–3)
HCT: 38.6 % (ref 34.0–46.6)
Lymphocytes Absolute: 1 10*3/uL (ref 0.7–4.5)
MCHC: 34.2 g/dL (ref 31.5–35.7)
MCV: 92 fL (ref 79–97)
Monocytes: 8 % (ref 4–13)
Neutrophils Absolute: 4.1 10*3/uL (ref 1.8–7.8)
RDW: 12.7 % (ref 12.3–15.4)
WBC: 5.8 10*3/uL (ref 4.0–10.5)

## 2011-06-08 LAB — HEPATIC FUNCTION PANEL
Alkaline Phosphatase: 95 IU/L (ref 25–165)
Bilirubin, Direct: 0.09 mg/dL (ref 0.00–0.40)
Total Bilirubin: 0.3 mg/dL (ref 0.0–1.2)
Total Protein: 6.8 g/dL (ref 6.0–8.5)

## 2011-06-08 NOTE — Assessment & Plan Note (Signed)
No evidence for recurrent atrial fibrillation.  Palpitations seem to have subsided.  She tolerated flecainide poorly and it has been stopped.  She will continue on Pradaxa, diltiazem CD and metoprolol.  If she has recurrent atrial fibrillation, would consider atrial fibrillation ablation given her significant symptoms with atrial fibrillation and side effects with antiarrhythmics. 

## 2011-06-08 NOTE — Progress Notes (Signed)
PCP: Dr. Hyacinth Meeker   71 yo with history of HTN and symptomatic atrial fibrillation with rapid ventricular response presents for cardiology followup of paroxysmal atrial fibrillation.  Initially, she was started on flecainide.  She did not tolerate flecainide well and had ongoing significant palpitations.  Given ongoing symptoms, I set her up for a 3 week event monitor.  This showed occasional PACs but no atrial fibrillation. Flecainide was stopped due to side effects.  Palpitations continue to be rare.   In 12/12, [name] developed some ankle swelling.  Her PCP started her on torsemide 20 mg daily, later decreasing to qod.  She felt "sick" on this dose of torsemide, so it was decreased to 10 mg daily, which has been better able to tolerate.  She is wearing support hose . Besides the ankle swelling, she also has had some generalized "shakiness" and has been losing her hair.  She denies tachypalpitations. No exertional dyspnea or chest pain.  Weight is up 2 lbs since last appointment.   Labs (3/12): HCT 34.6, FOBT negative, TSH normal, cardiac enzymes negative x 3, K 3.3, creatinine 0.85, BNP 32  Labs (7/12): creatinine 0.85, HCT 38, LDL 134, HDL 46 Labs (9/12): K 3.8, creatinine 0.76, BNP 47  PMH:  1. Palpitations/SVT  2. Atrial fibrillation: First definitive diagnosis in 3/12. Patient presented to [facility] with atrial fibrillation/rapid ventricular response. She was very symptomatic. She converted to NSR after receiving diltiazem. She was started on Pradaxa and flecainide.  - Echo (3/12): EF 60-65%, mild LV hypertrophy, trileaflet aortic valve with mild aortic insufficiency, normal RV size and systolic function.  - 48 hour holter (4/12) due to palpitations showed no atrial fibrillation and a few PACs and blocked PACs. No significant arrhythmia.  - 3 week event monitor (5/12): So far, no atrial fibrillation.  PACs and mild sinus tachycardia only noted.  - Flecainide stopped due to side effects.  3.  ETT-myoview (3/12): 4'1", stopped due to fatigue (no chest pain). Hypertensive BP response. EF 69%. No evidence for ischemia or infarction by ECG or perfusion images.  4. HTN: Intolerant of Norvasc.  5. GERD  6. Anxiety  7. H/o hysterectomy  8. Migraines  9. Cholecystectomy  10. Fibrocystic breast disease  11. Aortic insufficiency: Mild on 3/12 echo  12. Pneumonia 7/12.  13. Swelling with steroids  SH: Lives in [place]. Married to [employer]. Retired from [employer]. Nonsmoker.   FH: Adopted, does not know details of biological family.   Current Outpatient Prescriptions  Medication Sig Dispense Refill  . ALPRAZolam (XANAX) 0.25 MG tablet 0.25 mg. Take 1/2 to 1 tablet as needed       . azelastine (ASTELIN) 137 MCG/SPRAY nasal spray 1 spray by Nasal route 2 (two) times daily. Use in each nostril as directed       . Calcium Carbonate-Vitamin D (CALCIUM + D PO) Take two tablets daily       . carbamazepine (TEGRETOL XR) 200 MG 12 hr tablet Take 200 mg by mouth 2 (two) times daily. As needed       . Cholecalciferol (VITAMIN D3) 1000 UNITS CAPS Take by mouth 1 dose over 46 hours.        Marland Kitchen diltiazem (CARDIZEM CD) 360 MG 24 hr capsule Take 1 capsule (360 mg total) by mouth daily.  30 capsule  6  . esomeprazole (NEXIUM) 40 MG capsule 40 mg. Take two capsules daily       . estrogens, conjugated, (PREMARIN) 0.625  MG tablet Take 0.625 mg by mouth daily. Take daily for 21 days then do not take for 7 days.       . Fesoterodine Fumarate (TOVIAZ PO) Take by mouth 1 dose over 46 hours.        Marland Kitchen FLUoxetine (PROZAC) 20 MG capsule 20 mg. Take one tablet every other day.      Marland Kitchen FLUoxetine (PROZAC) 40 MG capsule 40 mg. Take one tablet every other day.       . fluticasone (FLONASE) 50 MCG/ACT nasal spray 2 sprays by Nasal route daily.        . metoprolol tartrate (LOPRESSOR) 25 MG tablet Take 1 tablet (25 mg total) by mouth 2 (two) times daily. And take 1 extra tablet at noon PRN for  increased heart rate.  90 tablet  6  . potassium chloride (K-DUR) 10 MEQ tablet Take 10 mEq by mouth daily.      Marland Kitchen PRADAXA 150 MG CAPS TAKE ONE CAPSULE BY MOUTH EVERY 12 HOURS  60 capsule  5  . pramipexole (MIRAPEX) 0.25 MG tablet 0.25 mg. Take one tablet every am, one tablet in the afternoon and two tablets nightly.       . SUMAtriptan (IMITREX) 50 MG tablet Take 50 mg by mouth every 2 (two) hours as needed.        . temazepam (RESTORIL) 15 MG capsule Take 15 mg by mouth at bedtime.       . torsemide (DEMADEX) 10 MG tablet Take 10 mg by mouth daily.        BP 122/78  Pulse 69  Ht 5\' 6"  (1.676 m)  Wt 81.647 kg (180 lb)  BMI 29.05 kg/m2 General: NAD  Neck: No JVD, no thyromegaly or thyroid nodule.  Lungs: Clear to auscultation bilaterally with normal respiratory effort.  CV: Nondisplaced PMI. Heart regular S1/S2, no S3/S4, 1/6 SEM RUSB. No peripheral edema. No carotid bruit. Normal pedal pulses.  Abdomen: Soft, nontender, no hepatosplenomegaly, no distention.  Neurologic: Alert and oriented x 3.  Psych: Normal affect.  Extremities: No clubbing or cyanosis.

## 2011-06-08 NOTE — Assessment & Plan Note (Signed)
She actually does not have any significant edema today.  She has been on torsemide 10 mg daily and is using compression stockings, which she will continue.  She has some other nonspecific symptoms: hair loss, general tremulousness. I will check her electrolytes, CBC, TSH, LFTs.  If no abnormalities, will repeat an echo.

## 2011-06-21 ENCOUNTER — Other Ambulatory Visit (INDEPENDENT_AMBULATORY_CARE_PROVIDER_SITE_OTHER): Payer: Medicare Other | Admitting: *Deleted

## 2011-06-21 DIAGNOSIS — I359 Nonrheumatic aortic valve disorder, unspecified: Secondary | ICD-10-CM

## 2011-06-21 DIAGNOSIS — R609 Edema, unspecified: Secondary | ICD-10-CM

## 2011-06-21 DIAGNOSIS — I4891 Unspecified atrial fibrillation: Secondary | ICD-10-CM

## 2011-06-25 ENCOUNTER — Other Ambulatory Visit: Payer: Self-pay | Admitting: Cardiovascular Disease

## 2011-07-27 ENCOUNTER — Emergency Department: Payer: Self-pay | Admitting: Emergency Medicine

## 2011-10-13 ENCOUNTER — Other Ambulatory Visit: Payer: Self-pay | Admitting: Cardiology

## 2011-11-25 ENCOUNTER — Other Ambulatory Visit: Payer: Self-pay | Admitting: Cardiology

## 2011-12-07 ENCOUNTER — Encounter: Payer: Self-pay | Admitting: Cardiology

## 2012-02-01 ENCOUNTER — Ambulatory Visit (INDEPENDENT_AMBULATORY_CARE_PROVIDER_SITE_OTHER): Payer: Medicare Other | Admitting: Cardiology

## 2012-02-01 ENCOUNTER — Encounter: Payer: Self-pay | Admitting: Cardiology

## 2012-02-01 VITALS — BP 110/62 | HR 71 | Ht 66.0 in | Wt 177.0 lb

## 2012-02-01 DIAGNOSIS — I4891 Unspecified atrial fibrillation: Secondary | ICD-10-CM

## 2012-02-01 DIAGNOSIS — R609 Edema, unspecified: Secondary | ICD-10-CM

## 2012-02-01 MED ORDER — METOPROLOL SUCCINATE ER 50 MG PO TB24: 50.0000 mg | ORAL_TABLET | Freq: Every day | ORAL | Status: DC

## 2012-02-01 NOTE — Patient Instructions (Addendum)
Stop metoprolol (metoprolol tartrate).  Start Toprol XL (metoprolol succinate)  50mg  daily.  Your physician wants you to follow-up in: 6 months with Dr Shirlee Latch. (March 2014). You will receive a reminder letter in the mail two months in advance. If you don't receive a letter, please call our office to schedule the follow-up appointment.

## 2012-02-02 NOTE — Progress Notes (Signed)
Patient ID: [name], female   DOB: 01-06-1941, 71 y.o.   MRN: [id number] PCP: Dr. Hyac[name]   71 yo with history of HTN and symptomatic atrial fibrillation with rapid ventricular response presents for cardiology followup of paroxysmal atrial fibrillation.  Initially, she was started on flecainide.  She did not tolerate flecainide well and had ongoing significant palpitations.  Given ongoing symptoms, I set her up for a 3 week event monitor.  This showed occasional PACs but no atrial fibrillation. Flecainide was stopped due to side effects.  She continues to feel "heavy beats" but no long runs of tachypalpitations.  She especially feels palpitations if she forgets her evening metoprolol.  No chest pain.  Mild exertional dyspnea walking up a hill. Stable mild ankle edema.   Labs (3/12): HCT 34.6, FOBT negative, TSH normal, cardiac enzymes negative x 3, K 3.3, creatinine 0.85, BNP 32  Labs (7/12): creatinine 0.85, HCT 38, LDL 134, HDL 46 Labs (9/12): K 3.8, creatinine 0.76, BNP 47 Labs (1/13): BNP 89, TSH normal Labs (7/13): K 4, creatinine 0.8  ECG: NSR, borderline LVH  PMH:  1. Palpitations/SVT  2. Atrial fibrillation: First definitive diagnosis in 3/12. Patient presented to [name] with atrial fibrillation/rapid ventricular response. She was very symptomatic. She converted to NSR after receiving diltiazem. She was started on Pradaxa and flecainide.  - Echo (3/12): EF 60-65%, mild LV hypertrophy, trileaflet aortic valve with mild aortic insufficiency, normal RV size and systolic function.  - 48 hour holter (4/12) due to palpitations showed no atrial fibrillation and a few PACs and blocked PACs. No significant arrhythmia.  - 3 week event monitor (5/12): no atrial fibrillation.  PACs and mild sinus tachycardia only noted.  - Flecainide stopped due to side effects.  - Echo (1/13): EF 65-70%, mild AI, mild TR, normal RV size and systolic function.  3. ETT-myoview (3/12): 4'1", stopped due to  fatigue (no chest pain). Hypertensive BP response. EF 69%. No evidence for ischemia or infarction by ECG or perfusion images.  4. HTN: Intolerant of Norvasc.  5. GERD  6. Anxiety  7. H/o hysterectomy  8. Migraines  9. Cholecystectomy  10. Fibrocystic breast disease  11. Aortic insufficiency: Mild on 3/12 echo  12. Pneumonia 7/12.  13. Swelling with steroids  SH: Lives in [name]. Married to [name]. Retired from [name]. Nonsmoker.   FH: Adopted, does not know details of biological family.  ROS: All systems reviewed and negative except as per HPI.    Current Outpatient Prescriptions  Medication Sig Dispense Refill  . ALPRAZolam (XANAX) 0.25 MG tablet 0.25 mg. Take 1/2 to 1 tablet as needed       . azelastine (ASTELIN) 137 MCG/SPRAY nasal spray 1 spray by Nasal route 2 (two) times daily. Use in each nostril as directed       . baclofen (LIORESAL) 10 MG tablet as directed.      . benzonatate (TESSALON) 100 MG capsule Take 1 tablet by mouth Twice daily.      . Calcium Carbonate-Vitamin D (CALCIUM + D PO) Take two tablets daily       . carbamazepine (TEGRETOL XR) 200 MG 12 hr tablet Take 200 mg by mouth 2 (two) times daily. As needed       . Cholecalciferol (VITAMIN D3) 1000 UNITS CAPS Take by mouth 1 dose over 46 hours.        . DEXILANT 60 MG capsule Take 1 tablet by mouth Daily.      Marland Kitchen  diltiazem (CARDIZEM CD) 360 MG 24 hr capsule TAKE 1 CAPSULE BY MOUTH EVERY DAY  30 capsule  5  . estrogens, conjugated, (PREMARIN) 0.625 MG tablet Take 0.625 mg by mouth daily. Take daily for 21 days then do not take for 7 days.       . Fesoterodine Fumarate (TOVIAZ PO) Take by mouth 1 dose over 46 hours.        Marland Kitchen FLUoxetine (PROZAC) 20 MG capsule 20 mg. Take one tablet every other day.      . fluticasone (FLONASE) 50 MCG/ACT nasal spray 2 sprays by Nasal route daily.        . potassium chloride (K-DUR) 10 MEQ tablet Take 10 mEq by mouth daily.      Marland Kitchen PRADAXA 150 MG CAPS  TAKE ONE CAPSULE BY MOUTH EVERY 12 HOURS  60 capsule  5  . pramipexole (MIRAPEX) 0.25 MG tablet 0.25 mg. Take one tablet every am, one tablet in the afternoon and two tablets nightly.       . SUMAtriptan (IMITREX) 50 MG tablet Take 50 mg by mouth every 2 (two) hours as needed.        . torsemide (DEMADEX) 10 MG tablet Take 10 mg by mouth daily.      . metoprolol succinate (TOPROL-XL) 50 MG 24 hr tablet Take 1 tablet (50 mg total) by mouth daily. Take with or immediately following a meal.  90 tablet  3    BP 110/62  Pulse 71  Ht 5\' 6"  (1.676 m)  Wt 177 lb (80.287 kg)  BMI 28.57 kg/m2 General: NAD  Neck: No JVD, no thyromegaly or thyroid nodule.  Lungs: Clear to auscultation bilaterally with normal respiratory effort.  CV: Nondisplaced PMI. Heart regular S1/S2, no S3/S4, 1/6 SEM RUSB. 1+ ankle edema bilaterally. No carotid bruit. Normal pedal pulses.  Abdomen: Soft, nontender, no hepatosplenomegaly, no distention.  Neurologic: Alert and oriented x 3.  Psych: Normal affect.  Extremities: No clubbing or cyanosis.  Assessment/Plan  1. Atrial fibrillation No documented atrial fibrillation recurrence though she continues to have palpitations.  She has been noted to have PACs on monitoring. She tolerated flecainide poorly and it has been stopped. She will continue on Pradaxa and diltiazem CD.  She notes palpitations most when she misses her evening metoprolol (happens frequently).  Therefore, I will have her stop metoprolol tartrate and instead take Toprol XL 50 mg daily so she does not have to remember to take a second dose.  If she has recurrent atrial fibrillation, would consider atrial fibrillation ablation given her significant symptoms with atrial fibrillation and side effects with antiarrhythmics. 2. Edema  Mild ankle edema, I suspect this may be from the diltiazem.  She is not using a diuretic now.  She can use compression stockings if needed.    [name] [employer]

## 2012-02-21 NOTE — Progress Notes (Signed)
This encounter was created in error - please disregard.

## 2012-04-09 ENCOUNTER — Other Ambulatory Visit: Payer: Self-pay | Admitting: Cardiology

## 2012-04-23 ENCOUNTER — Other Ambulatory Visit: Payer: Self-pay

## 2012-04-23 MED ORDER — DILTIAZEM HCL ER COATED BEADS 360 MG PO CP24: 360.0000 mg | ORAL_CAPSULE | Freq: Every day | ORAL | Status: DC

## 2012-04-23 NOTE — Telephone Encounter (Signed)
Refill sent for diltiazem ER 360 mg take one tablet daily.

## 2012-05-09 ENCOUNTER — Other Ambulatory Visit: Payer: Self-pay

## 2012-05-09 MED ORDER — DILTIAZEM HCL ER COATED BEADS 360 MG PO CP24: 360.0000 mg | ORAL_CAPSULE | Freq: Every day | ORAL | Status: DC

## 2012-05-09 NOTE — Telephone Encounter (Signed)
Refill diltiazem 360 mg take one tablet daily.

## 2012-05-22 ENCOUNTER — Other Ambulatory Visit: Payer: Self-pay | Admitting: Cardiology

## 2012-07-07 ENCOUNTER — Other Ambulatory Visit: Payer: Self-pay

## 2012-07-09 ENCOUNTER — Other Ambulatory Visit: Payer: Self-pay

## 2012-07-09 MED ORDER — DABIGATRAN ETEXILATE MESYLATE 150 MG PO CAPS: ORAL_CAPSULE | ORAL | Status: DC

## 2012-07-09 NOTE — Telephone Encounter (Signed)
Refill sent for pradaxa  

## 2012-07-13 ENCOUNTER — Other Ambulatory Visit: Payer: Self-pay

## 2012-07-16 ENCOUNTER — Other Ambulatory Visit: Payer: Self-pay | Admitting: *Deleted

## 2012-07-16 MED ORDER — METOPROLOL SUCCINATE ER 50 MG PO TB24: 50.0000 mg | ORAL_TABLET | Freq: Every day | ORAL | Status: DC

## 2012-07-25 ENCOUNTER — Other Ambulatory Visit: Payer: Self-pay | Admitting: *Deleted

## 2012-07-25 MED ORDER — METOPROLOL TARTRATE 25 MG PO TABS: 25.0000 mg | ORAL_TABLET | Freq: Two times a day (BID) | ORAL | Status: DC

## 2012-07-25 NOTE — Telephone Encounter (Signed)
Spoke with pt due to receiving refill request for metoprolol tartrate 25 mg bid.  Pt was discontinued metoprolol tartrate 25 bid last ov 02/01/12 and put on Metoprolol succinate 50 mg qd per [facility]. Pt mentioned that she has stopped taking metoprolol succinate 50 mg because she was having palpitations and skipped beats. She went back to metoprolol tartrate 25 mg bid because her symptoms were better. Pt was overdue for f/u appointment in 3/14 with [facility]. She requested appoint with Mariah Milling due to it being closer for her and husbands condition.  Appoint. 08/09/12.

## 2012-08-09 ENCOUNTER — Ambulatory Visit (INDEPENDENT_AMBULATORY_CARE_PROVIDER_SITE_OTHER): Payer: Medicare Other | Admitting: Cardiovascular Disease

## 2012-08-09 ENCOUNTER — Encounter: Payer: Self-pay | Admitting: Cardiovascular Disease

## 2012-08-09 VITALS — BP 134/80 | HR 71 | Ht 66.0 in | Wt 171.5 lb

## 2012-08-09 DIAGNOSIS — F438 Other reactions to severe stress: Secondary | ICD-10-CM

## 2012-08-09 DIAGNOSIS — F4329 Adjustment disorder with other symptoms: Secondary | ICD-10-CM | POA: Insufficient documentation

## 2012-08-09 DIAGNOSIS — R609 Edema, unspecified: Secondary | ICD-10-CM

## 2012-08-09 DIAGNOSIS — R0602 Shortness of breath: Secondary | ICD-10-CM

## 2012-08-09 DIAGNOSIS — I4891 Unspecified atrial fibrillation: Secondary | ICD-10-CM

## 2012-08-09 NOTE — Assessment & Plan Note (Signed)
No significant edema on today's visit 

## 2012-08-09 NOTE — Patient Instructions (Addendum)
You are doing well. No medication changes were made.  Please call us if you have new issues that need to be addressed before your next appt.  Your physician wants you to follow-up in: 6 months.  You will receive a reminder letter in the mail two months in advance. If you don't receive a letter, please call our office to schedule the follow-up appointment.   

## 2012-08-09 NOTE — Progress Notes (Signed)
Patient ID: [name], female    DOB: 09/18/1940, 72 y.o.   MRN: [id number]  HPI Comments: 72 yo with history of HTN and symptomatic atrial fibrillation with rapid ventricular response presents for cardiology followup of paroxysmal atrial fibrillation.    Initially, she was started on flecainide.  She did not tolerate flecainide well and had ongoing significant palpitations. 3 week event monitor showed occasional PACs but no atrial fibrillation. Flecainide was stopped due to side effects.   He spent most of her visit today talking about the stresses of taking care of her husband who has significant medical issues. She denies any significant tachycardia or palpitations. She is very stressed as her husband is controlling and needs her by his side most of the time. He is reluctant to have full-time or part-time caretakers despite his increasing medical needs.  ECG: shows normal sinus rhythm with rate 71 beats per minute with no significant ST or T wave changes  PMH:  1. Palpitations/SVT  2. Atrial fibrillation: First definitive diagnosis in 3/12. Patient presented to [facility] with atrial fibrillation/rapid ventricular response. She was very symptomatic. She converted to NSR after receiving diltiazem. She was started on Pradaxa and flecainide.  - Echo (3/12): EF 60-65%, mild LV hypertrophy, trileaflet aortic valve with mild aortic insufficiency, normal RV size and systolic function.  - 48 hour holter (4/12) due to palpitations showed no atrial fibrillation and a few PACs and blocked PACs. No significant arrhythmia.  - 3 week event monitor (5/12): no atrial fibrillation.  PACs and mild sinus tachycardia only noted.  - Flecainide stopped due to side effects.  - Echo (1/13): EF 65-70%, mild AI, mild TR, normal RV size and systolic function.  3. ETT-myoview (3/12): 4'1", stopped due to fatigue (no chest pain). Hypertensive BP response. EF 69%. No evidence for ischemia or infarction by ECG or  perfusion images.  4. HTN: Intolerant of Norvasc.  5. GERD  6. Anxiety  7. H/o hysterectomy  8. Migraines  9. Cholecystectomy  10. Fibrocystic breast disease  11. Aortic insufficiency: Mild on 3/12 echo  12. Pneumonia 7/12.  13. Swelling with steroids    Outpatient Encounter Prescriptions as of 08/09/2012  Medication Sig Dispense Refill  . ALPRAZolam (XANAX) 0.25 MG tablet 0.25 mg. Take 1/2 to 1 tablet as needed       . azelastine (ASTELIN) 137 MCG/SPRAY nasal spray Place 2 sprays into the nose 2 (two) times daily. Use in each nostril as directed      . baclofen (LIORESAL) 10 MG tablet as directed.      . benzonatate (TESSALON) 100 MG capsule Take 1 tablet by mouth Twice daily.      . Calcium Carbonate-Vitamin D (CALCIUM + D PO) Take two tablets daily       . carbamazepine (TEGRETOL XR) 200 MG 12 hr tablet Take 200 mg by mouth 2 (two) times daily. As needed       . Cholecalciferol (VITAMIN D3) 1000 UNITS CAPS Take by mouth 1 dose over 46 hours.        . dabigatran (PRADAXA) 150 MG CAPS TAKE ONE CAPSULE BY MOUTH EVERY 12 HOURS  60 capsule  1  . diltiazem (CARDIZEM CD) 360 MG 24 hr capsule Take 1 capsule (360 mg total) by mouth daily.  30 capsule  6  . esomeprazole (NEXIUM) 40 MG capsule Take 40 mg by mouth daily before breakfast.      . estrogens, conjugated, (PREMARIN) 0.625 MG tablet Take  0.625 mg by mouth daily. Take daily for 21 days then do not take for 7 days.       . Fesoterodine Fumarate (TOVIAZ PO) Take by mouth 1 dose over 46 hours.        Marland Kitchen FLUoxetine (PROZAC) 20 MG capsule Take 20 mg by mouth daily.       . fluticasone (FLONASE) 50 MCG/ACT nasal spray 2 sprays by Nasal route daily.        . Iron-Vitamin C (VITRON-C PO) Take 150 mg by mouth daily.      . magnesium gluconate (MAGONATE) 500 MG tablet Take 500 mg by mouth daily.      . metoprolol tartrate (LOPRESSOR) 25 MG tablet Take 1 tablet (25 mg total) by mouth 2 (two) times daily. And Take an extra tablet at noon as needed  for increase heart rate.  270 tablet  3  . potassium chloride (K-DUR) 10 MEQ tablet Take 10 mEq by mouth daily.      . pramipexole (MIRAPEX) 0.25 MG tablet Take 0.5 mg by mouth 3 (three) times daily. Take one tablet every am, one tablet in the afternoon and two tablets nightly.      . ranitidine (ZANTAC) 150 MG capsule Take 150 mg by mouth every evening.      . SUMAtriptan (IMITREX) 50 MG tablet Take 50 mg by mouth every 2 (two) hours as needed.        . temazepam (RESTORIL) 30 MG capsule Take 30 mg by mouth at bedtime as needed.       . torsemide (DEMADEX) 10 MG tablet Take 10 mg by mouth daily.      . traZODone (DESYREL) 50 MG tablet Take 50 mg by mouth at bedtime.          Review of Systems  Constitutional: Negative.   HENT: Negative.   Eyes: Negative.   Respiratory: Negative.   Cardiovascular: Negative.   Gastrointestinal: Negative.   Musculoskeletal: Negative.   Skin: Negative.   Neurological: Negative.   Psychiatric/Behavioral: The patient is nervous/anxious.   All other systems reviewed and are negative.    BP 134/80  Pulse 71  Ht 5\' 6"  (1.676 m)  Wt 171 lb 8 oz (77.792 kg)  BMI 27.69 kg/m2  Physical Exam  Nursing note and vitals reviewed. Constitutional: She is oriented to person, place, and time. She appears well-developed and well-nourished.  HENT:  Head: Normocephalic.  Nose: Nose normal.  Mouth/Throat: Oropharynx is clear and moist.  Eyes: Conjunctivae are normal. Pupils are equal, round, and reactive to light.  Neck: Normal range of motion. Neck supple. No JVD present.  Cardiovascular: Normal rate, regular rhythm, S1 normal, S2 normal, normal heart sounds and intact distal pulses.  Exam reveals no gallop and no friction rub.   No murmur heard. Pulmonary/Chest: Effort normal and breath sounds normal. No respiratory distress. She has no wheezes. She has no rales. She exhibits no tenderness.  Abdominal: Soft. Bowel sounds are normal. She exhibits no distension.  There is no tenderness.  Musculoskeletal: Normal range of motion. She exhibits no edema and no tenderness.  Lymphadenopathy:    She has no cervical adenopathy.  Neurological: She is alert and oriented to person, place, and time. Coordination normal.  Skin: Skin is warm and dry. No rash noted. No erythema.  Psychiatric: She has a normal mood and affect. Her behavior is normal. Judgment and thought content normal.    Assessment and Plan

## 2012-08-09 NOTE — Assessment & Plan Note (Signed)
No symptoms concerning for arrhythmia at this time. No changes made to her medications

## 2012-08-09 NOTE — Assessment & Plan Note (Signed)
She is having difficulty taking care of her husband. I've encouraged her to reach out for help with part-time assistance at home despite her husband's reluctance.

## 2012-09-04 ENCOUNTER — Other Ambulatory Visit: Payer: Self-pay

## 2012-09-04 MED ORDER — DABIGATRAN ETEXILATE MESYLATE 150 MG PO CAPS: ORAL_CAPSULE | ORAL | Status: DC

## 2012-09-04 NOTE — Telephone Encounter (Signed)
Refill sent for pradaxa 150 mg

## 2012-12-10 ENCOUNTER — Other Ambulatory Visit: Payer: Self-pay | Admitting: Cardiovascular Disease

## 2012-12-10 NOTE — Telephone Encounter (Signed)
Refilled Amiodarone sent to [facility].

## 2012-12-17 DIAGNOSIS — S060X9A Concussion with loss of consciousness of unspecified duration, initial encounter: Secondary | ICD-10-CM

## 2012-12-17 DIAGNOSIS — S060XAA Concussion with loss of consciousness status unknown, initial encounter: Secondary | ICD-10-CM

## 2012-12-17 HISTORY — DX: Concussion with loss of consciousness status unknown, initial encounter: S06.0XAA

## 2012-12-17 HISTORY — DX: Concussion with loss of consciousness of unspecified duration, initial encounter: S06.0X9A

## 2012-12-26 ENCOUNTER — Other Ambulatory Visit: Payer: Self-pay

## 2013-01-01 DIAGNOSIS — K219 Gastro-esophageal reflux disease without esophagitis: Secondary | ICD-10-CM

## 2013-01-01 DIAGNOSIS — S52209A Unspecified fracture of shaft of unspecified ulna, initial encounter for closed fracture: Secondary | ICD-10-CM

## 2013-01-01 DIAGNOSIS — F411 Generalized anxiety disorder: Secondary | ICD-10-CM

## 2013-01-01 DIAGNOSIS — I4891 Unspecified atrial fibrillation: Secondary | ICD-10-CM

## 2013-02-01 DIAGNOSIS — I4891 Unspecified atrial fibrillation: Secondary | ICD-10-CM

## 2013-02-01 DIAGNOSIS — IMO0001 Reserved for inherently not codable concepts without codable children: Secondary | ICD-10-CM

## 2013-02-01 DIAGNOSIS — F329 Major depressive disorder, single episode, unspecified: Secondary | ICD-10-CM

## 2013-02-01 DIAGNOSIS — K219 Gastro-esophageal reflux disease without esophagitis: Secondary | ICD-10-CM

## 2013-02-01 DIAGNOSIS — S5290XD Unspecified fracture of unspecified forearm, subsequent encounter for closed fracture with routine healing: Secondary | ICD-10-CM

## 2013-02-04 ENCOUNTER — Encounter: Payer: Self-pay | Admitting: Cardiovascular Disease

## 2013-02-04 ENCOUNTER — Ambulatory Visit (INDEPENDENT_AMBULATORY_CARE_PROVIDER_SITE_OTHER): Payer: Medicare Other | Admitting: Cardiovascular Disease

## 2013-02-04 VITALS — BP 122/76 | Ht 66.0 in | Wt 168.0 lb

## 2013-02-04 DIAGNOSIS — R002 Palpitations: Secondary | ICD-10-CM

## 2013-02-04 DIAGNOSIS — R079 Chest pain, unspecified: Secondary | ICD-10-CM

## 2013-02-04 DIAGNOSIS — I4891 Unspecified atrial fibrillation: Secondary | ICD-10-CM

## 2013-02-04 NOTE — Assessment & Plan Note (Addendum)
With her low-dose diltiazem and low-dose metoprolol, she is having more palpitations. We have suggested that she increase her metoprolol tartrate slowly initially up to 25 mg in the morning, perhaps an extra half dose at noon with her half dose in the p.m.. Stay on diltiazem 120 mg daily

## 2013-02-04 NOTE — Assessment & Plan Note (Signed)
No recent episodes of atrial fibrillation noted. We'll try to obtain records from [facility]

## 2013-02-04 NOTE — Patient Instructions (Addendum)
You are doing well.  Please take extra metoprolol 1/2 doses for palpitations am and noon  OK to take lasix as needed with potassium (banana)  Please call us if you have new issues that need to be addressed before your next appt.  Your physician wants you to follow-up in: 6 months.  You will receive a reminder letter in the mail two months in advance. If you don't receive a letter, please call our office to schedule the follow-up appointment.
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
## 2013-02-08 ENCOUNTER — Other Ambulatory Visit: Payer: Self-pay | Admitting: Internal Medicine

## 2013-02-18 ENCOUNTER — Other Ambulatory Visit: Payer: Self-pay | Admitting: Cardiovascular Disease

## 2013-02-18 ENCOUNTER — Other Ambulatory Visit: Payer: Self-pay | Admitting: Internal Medicine

## 2013-02-18 NOTE — Telephone Encounter (Signed)
Refilled Cartia sent to [facility].

## 2013-02-20 ENCOUNTER — Ambulatory Visit: Payer: Medicare Other | Admitting: Internal Medicine

## 2013-02-22 ENCOUNTER — Telehealth: Payer: Self-pay

## 2013-02-22 NOTE — Telephone Encounter (Signed)
PA with [facility] called and wants to know if we have received external EKG results. Please call.

## 2013-02-28 NOTE — Telephone Encounter (Signed)
Spoke w/ Kennyth Arnold.  She wanted to verify that we had received xiopatch results.

## 2013-03-28 ENCOUNTER — Other Ambulatory Visit: Payer: Self-pay

## 2013-03-29 ENCOUNTER — Other Ambulatory Visit: Payer: Self-pay | Admitting: Internal Medicine

## 2013-03-29 NOTE — Telephone Encounter (Signed)
Is this your patient? She canceled her new patient appt on 02/20/13, it looks like you have never seen her, please advise

## 2013-03-29 NOTE — Telephone Encounter (Signed)
She is not under my care since leaving [facility] (at least not as far as I know)

## 2013-03-29 NOTE — Telephone Encounter (Signed)
Denied rx sent back to pharmacy 

## 2013-04-24 ENCOUNTER — Observation Stay: Payer: Self-pay | Admitting: Internal Medicine

## 2013-04-24 DIAGNOSIS — R0989 Other specified symptoms and signs involving the circulatory and respiratory systems: Secondary | ICD-10-CM

## 2013-04-24 DIAGNOSIS — I4891 Unspecified atrial fibrillation: Secondary | ICD-10-CM

## 2013-04-24 DIAGNOSIS — R0609 Other forms of dyspnea: Secondary | ICD-10-CM

## 2013-04-24 DIAGNOSIS — R0789 Other chest pain: Secondary | ICD-10-CM

## 2013-04-24 LAB — CBC
HCT: 39.1 % (ref 35.0–47.0)
HGB: 13.1 g/dL (ref 12.0–16.0)
MCH: 30.8 pg (ref 26.0–34.0)
Platelet: 224 10*3/uL (ref 150–440)
RDW: 13.8 % (ref 11.5–14.5)

## 2013-04-24 LAB — COMPREHENSIVE METABOLIC PANEL
Albumin: 2.8 g/dL — ABNORMAL LOW (ref 3.4–5.0)
Calcium, Total: 9.1 mg/dL (ref 8.5–10.1)
Chloride: 106 mmol/L (ref 98–107)
Creatinine: 0.75 mg/dL (ref 0.60–1.30)
EGFR (African American): >60
SGOT(AST): 15 U/L (ref 15–37)
SGPT (ALT): 26 U/L (ref 12–78)
Sodium: 141 mmol/L (ref 136–145)
Total Protein: 6.2 g/dL — ABNORMAL LOW (ref 6.4–8.2)

## 2013-04-24 LAB — URINALYSIS, COMPLETE
Bacteria: NONE SEEN
Blood: NEGATIVE
Ketone: NEGATIVE
Leukocyte Esterase: NEGATIVE
Ph: 6 (ref 4.5–8.0)
Squamous Epithelial: <1

## 2013-04-24 LAB — PRO B NATRIURETIC PEPTIDE: B-Type Natriuretic Peptide: 1035 pg/mL — ABNORMAL HIGH (ref 0–125)

## 2013-04-24 LAB — CK TOTAL AND CKMB (NOT AT ARMC)
CK, Total: 49 U/L (ref 21–215)
CK, Total: 48 U/L (ref 21–215)
CK-MB: 1.3 ng/mL (ref 0.5–3.6)
CK-MB: 1.4 ng/mL (ref 0.5–3.6)
CK-MB: 1.5 ng/mL (ref 0.5–3.6)

## 2013-04-24 LAB — TROPONIN I: Troponin-I: <0.02 ng/mL

## 2013-04-24 LAB — PROTIME-INR
INR: 1
Prothrombin Time: 13.8 secs (ref 11.5–14.7)

## 2013-04-25 LAB — CBC WITH DIFFERENTIAL/PLATELET
Basophil #: 0 10*3/uL (ref 0.0–0.1)
Eosinophil #: 0.2 10*3/uL (ref 0.0–0.7)
Eosinophil %: 3.3 %
HCT: 36.6 % (ref 35.0–47.0)
HGB: 12.6 g/dL (ref 12.0–16.0)
MCH: 31.7 pg (ref 26.0–34.0)
MCHC: 34.4 g/dL (ref 32.0–36.0)
MCV: 92 fL (ref 80–100)
Monocyte #: 0.5 x10 3/mm (ref 0.2–0.9)
Monocyte %: 7.8 %
Neutrophil #: 4.8 10*3/uL (ref 1.4–6.5)
Neutrophil %: 69 %
RBC: 3.98 10*6/uL (ref 3.80–5.20)
RDW: 14 % (ref 11.5–14.5)
WBC: 6.9 10*3/uL (ref 3.6–11.0)

## 2013-04-25 LAB — BASIC METABOLIC PANEL
Anion Gap: 5 — ABNORMAL LOW (ref 7–16)
Co2: 28 mmol/L (ref 21–32)
Creatinine: 0.79 mg/dL (ref 0.60–1.30)
EGFR (African American): >60
Potassium: 3.8 mmol/L (ref 3.5–5.1)
Sodium: 139 mmol/L (ref 136–145)

## 2013-04-25 LAB — LIPID PANEL
Cholesterol: 220 mg/dL — ABNORMAL HIGH (ref 0–200)
Ldl Cholesterol, Calc: 132 mg/dL — ABNORMAL HIGH (ref 0–100)
Triglycerides: 193 mg/dL (ref 0–200)
VLDL Cholesterol, Calc: 39 mg/dL (ref 5–40)

## 2013-04-25 LAB — MAGNESIUM: Magnesium: 1.8 mg/dL

## 2013-04-30 ENCOUNTER — Other Ambulatory Visit: Payer: Self-pay | Admitting: Orthopedic Surgery

## 2013-04-30 DIAGNOSIS — M79609 Pain in unspecified limb: Secondary | ICD-10-CM

## 2013-05-03 ENCOUNTER — Ambulatory Visit
Admission: RE | Admit: 2013-05-03 | Discharge: 2013-05-03 | Disposition: A | Discharge status: Home / Self Care | Payer: Medicare Other | Source: Ambulatory Visit | Attending: Orthopedic Surgery | Admitting: Orthopedic Surgery

## 2013-05-03 DIAGNOSIS — M79609 Pain in unspecified limb: Secondary | ICD-10-CM

## 2013-05-21 ENCOUNTER — Other Ambulatory Visit: Payer: Self-pay | Admitting: Internal Medicine

## 2013-05-21 ENCOUNTER — Other Ambulatory Visit: Payer: Self-pay | Admitting: Cardiovascular Disease

## 2013-05-21 ENCOUNTER — Other Ambulatory Visit: Payer: Self-pay | Admitting: *Deleted

## 2013-05-21 MED ORDER — DABIGATRAN ETEXILATE MESYLATE 150 MG PO CAPS: ORAL_CAPSULE | ORAL | Status: DC

## 2013-05-21 NOTE — Telephone Encounter (Signed)
Pt no longer under Dr. Karle Starch care since leaving [facility]

## 2013-05-21 NOTE — Telephone Encounter (Signed)
Requested Prescriptions   Signed Prescriptions Disp Refills  . dabigatran (PRADAXA) 150 MG CAPS capsule 60 capsule 3    Sig: TAKE ONE CAPSULE BY MOUTH EVERY 12 HOURS    Authorizing Provider: Antonieta Iba    Ordering User: Kendrick Fries

## 2013-05-23 HISTORY — PX: OTHER SURGICAL HISTORY: SHX169

## 2013-05-23 HISTORY — PX: HERNIA REPAIR: SHX51

## 2013-06-10 ENCOUNTER — Other Ambulatory Visit: Payer: Self-pay | Admitting: Internal Medicine

## 2013-06-11 ENCOUNTER — Emergency Department: Payer: Self-pay | Admitting: Emergency Medicine

## 2013-06-22 ENCOUNTER — Other Ambulatory Visit: Payer: Self-pay | Admitting: Cardiovascular Disease

## 2013-06-24 ENCOUNTER — Other Ambulatory Visit: Payer: Self-pay | Admitting: *Deleted

## 2013-06-24 MED ORDER — DILTIAZEM HCL ER COATED BEADS 120 MG PO CP24: ORAL_CAPSULE | ORAL | Status: DC

## 2013-06-24 NOTE — Telephone Encounter (Signed)
Requested Prescriptions   Signed Prescriptions Disp Refills  . diltiazem (CARTIA XT) 120 MG 24 hr capsule 30 capsule 3    Sig: TAKE ONE CAPSULE BY MOUTH EVERY MORNING    Authorizing Provider: Antonieta IbaGOLLAN, TIMOTHY J    Ordering User: Kendrick FriesLOPEZ, MARINA C

## 2013-07-09 ENCOUNTER — Other Ambulatory Visit: Payer: Self-pay | Admitting: Cardiovascular Disease

## 2013-08-09 HISTORY — PX: CATARACT EXTRACTION: SUR2

## 2013-08-26 ENCOUNTER — Telehealth: Payer: Self-pay | Admitting: *Deleted

## 2013-08-26 ENCOUNTER — Encounter: Payer: Self-pay | Admitting: Cardiovascular Disease

## 2013-08-26 ENCOUNTER — Ambulatory Visit (INDEPENDENT_AMBULATORY_CARE_PROVIDER_SITE_OTHER): Payer: Medicare Other | Admitting: Cardiovascular Disease

## 2013-08-26 VITALS — BP 120/88 | HR 81 | Ht 66.0 in | Wt 186.2 lb

## 2013-08-26 DIAGNOSIS — R609 Edema, unspecified: Secondary | ICD-10-CM

## 2013-08-26 DIAGNOSIS — R002 Palpitations: Secondary | ICD-10-CM

## 2013-08-26 DIAGNOSIS — I4891 Unspecified atrial fibrillation: Secondary | ICD-10-CM

## 2013-08-26 MED ORDER — FLECAINIDE ACETATE 100 MG PO TABS: 100.0000 mg | ORAL_TABLET | Freq: Two times a day (BID) | ORAL | Status: DC | PRN

## 2013-08-26 NOTE — Patient Instructions (Signed)
You are doing well. No medication changes were made.  Please take flecainide and metoprolol as needed for episodes of atrial fibrillation  Please call us if you have new issues that need to be addressed before your next appt.  Your physician wants you to follow-up in: 6 months.  You will receive a reminder letter in the mail two months in advance. If you don't receive a letter, please call our office to schedule the follow-up appointment.

## 2013-08-26 NOTE — Telephone Encounter (Signed)
Spoke w/ Morrie SheldonAshley.  She states that there is an interaction b/t flecainide, levaquin & fluoxetine.  Spoke w/ Dr. Mariah MillingGollan who advises pt continue on meds as prescribed, as flecainide is prn and levaquin is short term. Ashley verbalizes understanding and fill pt's rx.

## 2013-08-26 NOTE — Assessment & Plan Note (Signed)
No significant edema on today's visit. Suggested she continue on her current medications. No signs of heart failure, appears euvolemic

## 2013-08-26 NOTE — Assessment & Plan Note (Signed)
We will try to obtain the most recent hospital records from December 2014 showing atrial fibrillation. She does not want to increase the dose of diltiazem or metoprolol. We have suggested she take extra metoprolol 25 mg and flecainide 100 mg when necessary for breakthrough episodes of atrial fibrillation. Also suggested she call our office for episodes. She would like to avoid the emergency room for repeat episodes

## 2013-08-26 NOTE — Telephone Encounter (Signed)
Please call Misty StanleyLisa the Pharmacist regarding a drug interaction
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
## 2013-08-26 NOTE — Assessment & Plan Note (Signed)
She continues to have episodes of palpitations, likely short runs of atrial fibrillations, APCs. Extra metoprolol and flecainide for breakthrough arrhythmia

## 2013-08-30 HISTORY — PX: CATARACT EXTRACTION: SUR2

## 2013-09-13 ENCOUNTER — Telehealth: Payer: Self-pay

## 2013-09-13 NOTE — Telephone Encounter (Signed)
Spoke w/ pt.  She called asking for cardiac clearance be sent to Dr. Orlan Leavensrtman at [phone]. States that she came in for clearance eval at last ov and would like to know if this note can be addended to include statement that she can proceed w/ surgery and how long to be off of pradaxa.  Please advise.  Thank you.

## 2013-09-16 ENCOUNTER — Other Ambulatory Visit: Payer: Self-pay | Admitting: *Deleted

## 2013-09-16 MED ORDER — METOPROLOL TARTRATE 25 MG PO TABS: 25.0000 mg | ORAL_TABLET | Freq: Two times a day (BID) | ORAL | Status: DC

## 2013-09-16 NOTE — Telephone Encounter (Signed)
.   Requested Prescriptions   Signed Prescriptions Disp Refills  . metoprolol tartrate (LOPRESSOR) 25 MG tablet 270 tablet 3    Sig: Take 1 tablet (25 mg total) by mouth 2 (two) times daily. And Take an extra tablet at noon as needed for increase heart rate.    Authorizing Provider: Antonieta IbaGOLLAN, TIMOTHY J    Ordering User: Kendrick FriesLOPEZ, MARINA C

## 2013-09-16 NOTE — Telephone Encounter (Signed)
Can not change April note We can write a letter to surgeon : Acceptable risk for surgery Off pradaxa three days Please document surgery: what is the procedure? Not documented anywhere

## 2013-09-16 NOTE — Telephone Encounter (Signed)
Sent clearance letter to Dr. Melvyn Novasrtmann.

## 2013-10-15 ENCOUNTER — Other Ambulatory Visit: Payer: Self-pay | Admitting: Cardiovascular Disease

## 2013-11-08 ENCOUNTER — Other Ambulatory Visit: Payer: Self-pay | Admitting: Cardiovascular Disease

## 2014-02-24 ENCOUNTER — Ambulatory Visit (INDEPENDENT_AMBULATORY_CARE_PROVIDER_SITE_OTHER): Payer: Medicare Other | Admitting: Cardiovascular Disease

## 2014-02-24 ENCOUNTER — Encounter: Payer: Self-pay | Admitting: Cardiovascular Disease

## 2014-02-24 VITALS — BP 142/80 | HR 86 | Ht 66.0 in | Wt 190.2 lb

## 2014-02-24 DIAGNOSIS — R05 Cough: Secondary | ICD-10-CM | POA: Insufficient documentation

## 2014-02-24 DIAGNOSIS — R609 Edema, unspecified: Secondary | ICD-10-CM

## 2014-02-24 DIAGNOSIS — R002 Palpitations: Secondary | ICD-10-CM

## 2014-02-24 DIAGNOSIS — I480 Paroxysmal atrial fibrillation: Secondary | ICD-10-CM

## 2014-02-24 DIAGNOSIS — R053 Chronic cough: Secondary | ICD-10-CM

## 2014-02-24 DIAGNOSIS — R079 Chest pain, unspecified: Secondary | ICD-10-CM

## 2014-02-24 DIAGNOSIS — R0602 Shortness of breath: Secondary | ICD-10-CM

## 2014-02-24 DIAGNOSIS — R0600 Dyspnea, unspecified: Secondary | ICD-10-CM

## 2014-02-24 NOTE — Assessment & Plan Note (Signed)
No recent episodes of atrial fibrillation. We'll continue her on her current medications. She will only take flecainide as needed, stay on metoprolol

## 2014-02-24 NOTE — Assessment & Plan Note (Signed)
Rare palpitations. Likely APCs or PVCs. We'll continue her beta blocker

## 2014-02-24 NOTE — Patient Instructions (Signed)
You are doing well. No medication changes were made.  Please try lasix/torsemide as needed for Leg swelling, cough  Please call us if you have new issues at need to be addressed before your next appt.  Your physician wants you to follow-up in: 6 months.  You will receive a reminder letter in the mail two months in advance. If you don't receive a letter, please call our office to schedule the follow-up appointment.

## 2014-02-24 NOTE — Assessment & Plan Note (Signed)
No significant edema noted on today's visit. She does report that she has a diuretic at home that she can take as needed

## 2014-02-24 NOTE — Assessment & Plan Note (Signed)
Mild chronic shortness of breath at baseline. Likely secondary to deconditioning, obesity. Recommended she restart her exercise program
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
## 2014-02-27 ENCOUNTER — Ambulatory Visit: Payer: Self-pay | Admitting: Surgery

## 2014-03-04 ENCOUNTER — Ambulatory Visit: Payer: Self-pay | Admitting: Surgery

## 2014-03-07 ENCOUNTER — Other Ambulatory Visit: Payer: Self-pay

## 2014-04-14 ENCOUNTER — Other Ambulatory Visit: Payer: Self-pay | Admitting: Cardiovascular Disease

## 2014-05-11 ENCOUNTER — Other Ambulatory Visit: Payer: Self-pay | Admitting: Cardiovascular Disease

## 2014-08-27 ENCOUNTER — Ambulatory Visit (INDEPENDENT_AMBULATORY_CARE_PROVIDER_SITE_OTHER): Payer: Medicare Other | Admitting: Cardiovascular Disease

## 2014-08-27 ENCOUNTER — Encounter: Payer: Self-pay | Admitting: Cardiovascular Disease

## 2014-08-27 VITALS — BP 120/80 | HR 83 | Ht 66.0 in | Wt 186.5 lb

## 2014-08-27 DIAGNOSIS — R609 Edema, unspecified: Secondary | ICD-10-CM

## 2014-08-27 DIAGNOSIS — R0602 Shortness of breath: Secondary | ICD-10-CM | POA: Diagnosis not present

## 2014-08-27 DIAGNOSIS — I4891 Unspecified atrial fibrillation: Secondary | ICD-10-CM | POA: Diagnosis not present

## 2014-08-27 DIAGNOSIS — R05 Cough: Secondary | ICD-10-CM | POA: Diagnosis not present

## 2014-08-27 DIAGNOSIS — R053 Chronic cough: Secondary | ICD-10-CM

## 2014-08-27 DIAGNOSIS — R0600 Dyspnea, unspecified: Secondary | ICD-10-CM

## 2014-08-27 NOTE — Assessment & Plan Note (Signed)
Shortness of breath likely multifactorial including deconditioning, obesity, general lack of exercise. Also with rales at the bases concerning for pulmonary fibrosis or other etiology. Will monitor for now. If symptoms get worse, may need to see pulmonary

## 2014-08-27 NOTE — Assessment & Plan Note (Signed)
Rales appreciated at the bases. Unable to exclude pulmonary fibrosis. Less likely CHF though need to consider this as well as atelectasis. She is at her baseline weight, recent aggressive diuresis. Previously tried on prednisone with no improvement
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
## 2014-08-27 NOTE — Patient Instructions (Signed)
You are doing well.  Ok to cut back on torsemide, goal weight 183  Monitor your blood pressure at home when you have episodes  Continue to take diltiazem at night  Don't forget to eat!!  Please call us if you have new issues that need to be addressed before your next appt.  Your physician wants you to follow-up in: 6 months.  You will receive a reminder letter in the mail two months in advance. If you don't receive a letter, please call our office to schedule the follow-up appointment.

## 2014-08-27 NOTE — Assessment & Plan Note (Signed)
She denies any episodes of atrial fibrillation. Rare episodes of tachycardia which she feels is a different rhythm, does not last as long, resolves without intervention. Likely ectopy Tolerating anticoagulation

## 2014-08-27 NOTE — Assessment & Plan Note (Signed)
Edema improved with torsemide 20 g daily. Recommended her goal weight the 183 pounds give or take a few pounds.  Recently was 190 pounds with leg edema. 

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
## 2014-09-13 NOTE — Op Note (Signed)
PATIENT NAMKnute [name]:  [name], [name] MR#:  [id number] DATE OF BIRTH:  1940/08/04  DATE OF PROCEDURE:  03/04/2014  PREOPERATIVE DIAGNOSIS: Recurrent right inguinal hernia.   POSTOPERATIVE DIAGNOSIS: Recurrent right inguinal hernia.   PROCEDURE: Right inguinal hernia repair.   SURGEON: Adella HareJ. Wilton Treyveon Mochizuki, M.D.   ANESTHESIA: General.   INDICATIONS: This 74 year old female had a right inguinal hernia repair in 2008 in another city, recently came in with chronic right groin pain, which dates back to her previous surgery, and also recurrent bulging in the right groin. A right inguinal hernia was demonstrated on physical examination and repair was recommended for definitive treatment.   DESCRIPTION OF PROCEDURE: The patient was placed on the operating table in the supine position under general anesthesia. The right lower abdomen was prepared with ChloraPrep and draped in a sterile manner.   A transversely oriented right suprapubic incision was made at the site of an old scar, carried down through subcutaneous tissues. One traversing vein was divided between 4-0 chromic suture ligatures. Scarpa's fascia was incised. The external oblique aponeurosis was incised along the course of its fibers to open the external ring and expose an inguinal hernia sac. This appeared to be a sliding type hernia consisting of fatty tissue, the hernia sac and part of the hernia sac was made up of the wall of bowel. The sac was dissected free from surrounding structures and was separated from a fascial ring defect. The defect itself was some 4 cm in dimension. The hernia was reduced as this sac was inverted. It is noted that during the course of the procedure, a number of old sutures were removed, which appeared to be Prolene. These were removed. It was also noted that a search was made for ilioinguinal nerve to sever it as she had chronic pain, and removed some additional Prolene sutures.   Next, the repair was carried out with a row of 0  Surgilon sutures beginning at the pubic tubercle, suturing the conjoined tendon to the shelving edge of the inguinal ligament incorporating transversalis fascia into the repair. This row of sutures was carried out to obliterate the internal ring. Next, an onlay Bard soft mesh was cut to create mesh, which was approximately 2.8 x 4 cm in dimension. It was placed over the repair. It was sutured to the repair with interrupted 0 Surgilon and also sutured medially to the fascia.   Next, the hemostasis was noted to be intact. The cut edges of the external oblique aponeurosis were closed with a running 4-0 Vicryl. The deep fascia superior and lateral to the repair site was infiltrated with 0.5% Sensorcaine with epinephrine. Also subcutaneous tissues were infiltrated. Next, the Scarpa's fascia was closed with interrupted 4-0 Vicryl. The skin was closed with running 4-0 Monocryl subcuticular suture and Dermabond.   The patient tolerated surgery satisfactorily and was then prepared for transfer to the recovery room.    ____________________________ Shela CommonsJ. Renda RollsWilton Jermari Tamargo, MD jws:JT D: 03/04/2014 13:31:49 ET T: 03/04/2014 13:49:41 ET JOB#: [id number]  cc: Adella HareJ. Wilton Jaquelinne Glendening, MD, <Dictator> Adella HareWILTON J Brightyn Mozer MD ELECTRONICALLY SIGNED 03/04/2014 18:57

## 2014-10-15 ENCOUNTER — Other Ambulatory Visit: Payer: Self-pay | Admitting: Cardiovascular Disease

## 2014-10-30 ENCOUNTER — Encounter: Payer: Self-pay | Admitting: Neurology

## 2014-10-30 ENCOUNTER — Ambulatory Visit (INDEPENDENT_AMBULATORY_CARE_PROVIDER_SITE_OTHER): Payer: Medicare Other | Admitting: Neurology

## 2014-10-30 VITALS — BP 147/70 | HR 67 | Temp 97.0°F | Ht 66.0 in | Wt 193.4 lb

## 2014-10-30 DIAGNOSIS — S069X0A Unspecified intracranial injury without loss of consciousness, initial encounter: Secondary | ICD-10-CM | POA: Diagnosis not present

## 2014-10-30 DIAGNOSIS — R413 Other amnesia: Secondary | ICD-10-CM

## 2014-10-30 DIAGNOSIS — R5382 Chronic fatigue, unspecified: Secondary | ICD-10-CM | POA: Diagnosis not present

## 2014-10-30 DIAGNOSIS — E519 Thiamine deficiency, unspecified: Secondary | ICD-10-CM | POA: Insufficient documentation

## 2014-10-30 DIAGNOSIS — E538 Deficiency of other specified B group vitamins: Secondary | ICD-10-CM | POA: Diagnosis not present

## 2014-10-30 DIAGNOSIS — E531 Pyridoxine deficiency: Secondary | ICD-10-CM | POA: Insufficient documentation

## 2014-10-30 DIAGNOSIS — R5383 Other fatigue: Secondary | ICD-10-CM | POA: Insufficient documentation

## 2014-10-30 DIAGNOSIS — R404 Transient alteration of awareness: Secondary | ICD-10-CM
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
## 2014-10-30 NOTE — Patient Instructions (Signed)
Overall you are doing fairly well but I do want to suggest a few things today:   Remember to drink plenty of fluid, eat healthy meals and do not skip any meals. Try to eat protein with a every meal and eat a healthy snack such as fruit or nuts in between meals. Try to keep a regular sleep-wake schedule and try to exercise daily, particularly in the form of walking, 20-30 minutes a day, if you can.   As far as diagnostic testing: MRi of the brain,labs  I would like to see you back in 4-6 months, sooner if we need to. Please call us with any interim questions, concerns, problems, updates or refill requests.   Please also call us for any test results so we can go over those with you on the phone.  My clinical assistant and will answer any of your questions and relay your messages to me and also relay most of my messages to you.   Our phone number is [phone]. We also have an after hours call service for urgent matters and there is a physician on-call for urgent questions. For any emergencies you know to call 911 or go to the nearest emergency room

## 2014-10-31 ENCOUNTER — Other Ambulatory Visit: Payer: Self-pay | Admitting: Cardiovascular Disease

## 2014-11-01 ENCOUNTER — Encounter: Payer: Self-pay | Admitting: Neurology

## 2014-11-03 LAB — B12 AND FOLATE PANEL
Folate: >20 ng/mL (ref >3.0)
Vitamin B-12: 1021 pg/mL — ABNORMAL HIGH (ref 211–946)

## 2014-11-03 LAB — CBC
HEMATOCRIT: 36.6 % (ref 34.0–46.6)
HEMOGLOBIN: 11.9 g/dL (ref 11.1–15.9)
MCH: 29.5 pg (ref 26.6–33.0)
MCHC: 32.5 g/dL (ref 31.5–35.7)
MCV: 91 fL (ref 79–97)
PLATELETS: 253 10*3/uL (ref 150–379)
RBC: 4.03 x10E6/uL (ref 3.77–5.28)
RDW: 14.8 % (ref 12.3–15.4)
WBC: 8.5 10*3/uL (ref 3.4–10.8)

## 2014-11-03 LAB — COMPREHENSIVE METABOLIC PANEL
ALK PHOS: 67 IU/L (ref 39–117)
ALT: 54 IU/L — AB (ref 0–32)
AST: 47 IU/L — ABNORMAL HIGH (ref 0–40)
Albumin/Globulin Ratio: 2 (ref 1.1–2.5)
Albumin: 3.9 g/dL (ref 3.5–4.8)
BILIRUBIN TOTAL: 0.4 mg/dL (ref 0.0–1.2)
BUN / CREAT RATIO: 23 (ref 11–26)
BUN: 17 mg/dL (ref 8–27)
CO2: 25 mmol/L (ref 18–29)
CREATININE: 0.74 mg/dL (ref 0.57–1.00)
Calcium: 9.4 mg/dL (ref 8.7–10.3)
Chloride: 102 mmol/L (ref 97–108)
GFR calc Af Amer: 94 mL/min/{1.73_m2} (ref >59)
GFR calc non Af Amer: 81 mL/min/{1.73_m2} (ref >59)
GLUCOSE: 116 mg/dL — AB (ref 65–99)
Globulin, Total: 2 g/dL (ref 1.5–4.5)
Potassium: 4.8 mmol/L (ref 3.5–5.2)
Sodium: 141 mmol/L (ref 134–144)
Total Protein: 5.9 g/dL — ABNORMAL LOW (ref 6.0–8.5)

## 2014-11-03 LAB — VITAMIN B6: Vitamin B6: 74.4 ug/L — ABNORMAL HIGH (ref 2.0–32.8)

## 2014-11-03 LAB — TSH: TSH: 2 u[IU]/mL (ref 0.450–4.500)

## 2014-11-03 LAB — VITAMIN B1: Thiamine: 201.6 nmol/L — ABNORMAL HIGH (ref 66.5–200.0)

## 2014-11-04 ENCOUNTER — Encounter: Payer: Self-pay | Admitting: *Deleted

## 2014-11-04 ENCOUNTER — Telehealth: Payer: Self-pay | Admitting: *Deleted

## 2014-11-04 NOTE — Telephone Encounter (Signed)
Spoke with pt about lab work.  Told her Most labs were normal, but told her B6 was high and to d/c B6 200mg  and to just take a multi-vitamin OTC with small amount of B6 in it. Pt verbalized understanding.

## 2014-11-12 ENCOUNTER — Telehealth: Payer: Self-pay | Admitting: *Deleted

## 2014-11-12 ENCOUNTER — Ambulatory Visit (INDEPENDENT_AMBULATORY_CARE_PROVIDER_SITE_OTHER): Payer: Medicare Other | Admitting: Neurology

## 2014-11-12 DIAGNOSIS — R404 Transient alteration of awareness: Secondary | ICD-10-CM

## 2014-11-12 NOTE — Telephone Encounter (Signed)
Left VM to let pt know I was calling about results. Gave [facility] phone number and office hours.  
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
## 2014-11-13 ENCOUNTER — Ambulatory Visit
Admission: RE | Admit: 2014-11-13 | Discharge: 2014-11-13 | Disposition: A | Discharge status: Home / Self Care | Payer: Medicare Other | Source: Ambulatory Visit | Attending: Neurology | Admitting: Neurology

## 2014-11-13 DIAGNOSIS — R413 Other amnesia: Secondary | ICD-10-CM

## 2014-11-13 DIAGNOSIS — E531 Pyridoxine deficiency: Secondary | ICD-10-CM

## 2014-11-13 DIAGNOSIS — E538 Deficiency of other specified B group vitamins: Secondary | ICD-10-CM

## 2014-11-13 DIAGNOSIS — R5382 Chronic fatigue, unspecified: Secondary | ICD-10-CM

## 2014-11-13 DIAGNOSIS — E519 Thiamine deficiency, unspecified: Secondary | ICD-10-CM

## 2014-11-13 DIAGNOSIS — S069X0A Unspecified intracranial injury without loss of consciousness, initial encounter: Secondary | ICD-10-CM | POA: Diagnosis not present

## 2014-11-13 NOTE — Telephone Encounter (Signed)
Patient returned call. Please call and advise.  °

## 2014-11-13 NOTE — Telephone Encounter (Signed)
Spoke with pt about normal EEG results. Pt verbalized understanding and states she is going for her MRI today. Told her to call back with any more questions.

## 2014-11-17 ENCOUNTER — Other Ambulatory Visit: Payer: Self-pay

## 2014-12-02 ENCOUNTER — Telehealth: Payer: Self-pay

## 2014-12-02 NOTE — Telephone Encounter (Signed)
Pt dropped off BP readings for April 2016.   Indicates that pt runs in the 90s/60s after torsemide 20 mg and 120-140/70-80 w/ no torsemide. Spoke w/ pt.  Advised her that Dr. Mariah MillingGollan recommends she decrease her torsemide to 10 mg on days when her BP is low.  She verbalizes understanding and reports that she has not been taking any torsemide unless she has swelling.  Reports that she recently adopted 2 kittens and there is fluid leaking from her leg where they have scratched her.  Pt does not weigh herself daily, does not wear compression hose (they are uncomfortable) and does not keep her feet elevated (she can't press the pedal on the sewing machine). Discussed this at length w/ pt, as well as the effect of fluids on her BP. She verbalizes understanding and will call back w/ any further questions or concerns.

## 2014-12-22 ENCOUNTER — Telehealth: Payer: Self-pay | Admitting: Neurology

## 2014-12-22 NOTE — Telephone Encounter (Signed)
Son called, would like for you to call Mom back regarding injection

## 2014-12-22 NOTE — Telephone Encounter (Signed)
Great, they are supraorbital injections, using lidocaine. Similar to the occipital injections we do thanks!

## 2014-12-22 NOTE — Telephone Encounter (Signed)
Spoke w/ Dewayne Hatch and she spoke with Dr. Lucia Gaskins about doing injections and stated she was calling back because she wanted to make appt. Made appt for 01/01/15 at 1:15pm. Told her to arrive at 1:00pm. She verbalized understanding.

## 2015-01-01 ENCOUNTER — Encounter: Payer: Self-pay | Admitting: Neurology

## 2015-01-01 ENCOUNTER — Ambulatory Visit (INDEPENDENT_AMBULATORY_CARE_PROVIDER_SITE_OTHER): Payer: Medicare Other | Admitting: Neurology

## 2015-01-01 VITALS — BP 138/79 | HR 78 | Temp 97.0°F | Ht 66.0 in | Wt 198.2 lb

## 2015-01-01 DIAGNOSIS — R413 Other amnesia: Secondary | ICD-10-CM

## 2015-01-01 DIAGNOSIS — G243 Spasmodic torticollis: Secondary | ICD-10-CM

## 2015-01-01 DIAGNOSIS — G500 Trigeminal neuralgia: Secondary | ICD-10-CM

## 2015-01-01 DIAGNOSIS — G529 Cranial nerve disorder, unspecified: Secondary | ICD-10-CM

## 2015-01-01 NOTE — Patient Instructions (Signed)
Overall you are doing great but I do want to suggest a few things today:   Remember to drink plenty of fluid, eat healthy meals and do not skip any meals. Try to eat protein with a every meal and eat a healthy snack such as fruit or nuts in between meals. Try to keep a regular sleep-wake schedule and try to exercise daily, particularly in the form of walking, 20-30 minutes a day, if you can.   As far as your medications are concerned, I would like to suggest: follow up for botox injections  I would like to see you back in 1-2 weeks, sooner if we need to. Please call us with any interim questions, concerns, problems, updates or refill requests.   Our phone number is [phone]. We also have an after hours call service for urgent matters and there is a physician on-call for urgent questions. For any emergencies you know to call 911 or go to the nearest emergency room

## 2015-01-04 DIAGNOSIS — G500 Trigeminal neuralgia: Secondary | ICD-10-CM | POA: Insufficient documentation
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
## 2015-01-21 ENCOUNTER — Ambulatory Visit: Payer: Medicare Other | Admitting: Neurology

## 2015-01-22 ENCOUNTER — Encounter: Payer: Self-pay | Admitting: Neurology

## 2015-01-22 ENCOUNTER — Ambulatory Visit (INDEPENDENT_AMBULATORY_CARE_PROVIDER_SITE_OTHER): Payer: Medicare Other | Admitting: Neurology

## 2015-01-22 VITALS — BP 130/74 | HR 73 | Ht 66.0 in | Wt 197.2 lb

## 2015-01-22 DIAGNOSIS — G243 Spasmodic torticollis: Secondary | ICD-10-CM | POA: Diagnosis not present

## 2015-01-22 MED ORDER — GABAPENTIN 300 MG PO CAPS: 300.0000 mg | ORAL_CAPSULE | Freq: Three times a day (TID) | ORAL | Status: DC

## 2015-01-22 NOTE — Patient Instructions (Signed)
As far as your medications are concerned, I would like to suggest: Neurontin  (1 capsule) three times a day  I would like to see you back in 3 months, sooner if we need to. Please call us with any interim questions, concerns, problems, updates or refill requests.   Our phone number is [phone]. We also have an after hours call service for urgent matters and there is a physician on-call for urgent questions. For any emergencies you know to call 911 or go to the nearest emergency room
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
## 2015-02-03 ENCOUNTER — Ambulatory Visit: Payer: Medicare Other | Admitting: Neurology

## 2015-02-23 ENCOUNTER — Other Ambulatory Visit: Payer: Self-pay | Admitting: Cardiovascular Disease

## 2015-02-24 ENCOUNTER — Other Ambulatory Visit: Payer: Self-pay | Admitting: Cardiovascular Disease

## 2015-03-05 ENCOUNTER — Other Ambulatory Visit: Payer: Self-pay | Admitting: Neurology

## 2015-03-05 ENCOUNTER — Telehealth: Payer: Self-pay | Admitting: Neurology

## 2015-03-05 DIAGNOSIS — G243 Spasmodic torticollis: Secondary | ICD-10-CM

## 2015-03-05 DIAGNOSIS — G500 Trigeminal neuralgia: Secondary | ICD-10-CM

## 2015-03-05 MED ORDER — GABAPENTIN 100 MG PO CAPS: 100.0000 mg | ORAL_CAPSULE | Freq: Three times a day (TID) | ORAL | Status: DC

## 2015-03-05 MED ORDER — BACLOFEN 10 MG PO TABS: 10.0000 mg | ORAL_TABLET | Freq: Three times a day (TID) | ORAL | Status: DC | PRN

## 2015-03-05 NOTE — Telephone Encounter (Signed)
Called and spoke to pt to inform her per Dr. Lucia GaskinsAhern that she placed PT referral and called in Baclofen (1 tablet, 3 times daily prn) and a lower dose of neurontin (100mg  3x/day) to her pharmacy. Told her to call if she has any side effects or symptoms are not improving. She verbalized understanding.

## 2015-03-05 NOTE — Telephone Encounter (Signed)
Patient is calling and is having a lot of pain in her neck. She has already had injections. Please call. Thank you.

## 2015-03-05 NOTE — Telephone Encounter (Signed)
Great, can you place the PT referral for musculoskeletal neck pain please? I will call in Baclofen and a lower dose of neurontin for her. Thanks!

## 2015-03-05 NOTE — Telephone Encounter (Signed)
Called pt back. She stated she is experiencing pain on her left side where she got injections. The tightness is not as bad as before. She has put a Lidocaine patch on, but this is not helping. She has also taken a tizanidine 4mg  (she can take up to 3 times per day) and this did not help. I asked if she is taking the Robaxin, but her doctor gave her the tizanidine instead. She did not refill the robaxin. She also said when she took the gabapentin, this helped but the dose she was on made her too tired the next day. She is wondering if she can try a lower dose because this really helped. I told her Dr. Lucia GaskinsAhern suggested a referral to physical therapy to help with her symptoms. She agrees and would like referral sent to the [facility] in [place]. Phone: [phone].Told her the referral can take 1-2 weeks to process. Verified she had their phone number.  I also suggested that since she is not taking the robaxin, and the tizanidine is not helping, Dr. Lucia GaskinsAhern mentioned trying baclofen or flexeril. Both are muscle relaxer's. She has not tried either before and is okay to try one or the other. Told her I will speak with Dr. Lucia GaskinsAhern and call her back. She verbalized understanding.

## 2015-03-16 ENCOUNTER — Ambulatory Visit (INDEPENDENT_AMBULATORY_CARE_PROVIDER_SITE_OTHER): Payer: Medicare Other | Admitting: Cardiovascular Disease

## 2015-03-16 ENCOUNTER — Encounter: Payer: Self-pay | Admitting: Cardiovascular Disease

## 2015-03-16 VITALS — BP 100/60 | HR 63 | Ht 66.0 in | Wt 196.5 lb

## 2015-03-16 DIAGNOSIS — R05 Cough: Secondary | ICD-10-CM

## 2015-03-16 DIAGNOSIS — M7989 Other specified soft tissue disorders: Secondary | ICD-10-CM | POA: Diagnosis not present

## 2015-03-16 DIAGNOSIS — R0600 Dyspnea, unspecified: Secondary | ICD-10-CM | POA: Diagnosis not present

## 2015-03-16 DIAGNOSIS — R600 Localized edema: Secondary | ICD-10-CM

## 2015-03-16 DIAGNOSIS — I480 Paroxysmal atrial fibrillation: Secondary | ICD-10-CM | POA: Diagnosis not present

## 2015-03-16 DIAGNOSIS — R0602 Shortness of breath: Secondary | ICD-10-CM

## 2015-03-16 DIAGNOSIS — R053 Chronic cough: Secondary | ICD-10-CM

## 2015-03-16 MED ORDER — METOPROLOL TARTRATE 50 MG PO TABS: 50.0000 mg | ORAL_TABLET | Freq: Two times a day (BID) | ORAL | Status: DC

## 2015-03-16 NOTE — Patient Instructions (Signed)
You are doing well.  Please stop the diltiazem Please increase the metoprolol up to 50 mg twice a day  Continue to use compression hose, Leg elevation  Please call us if you have new issues that need to be addressed before your next appt.  Your physician wants you to follow-up in: 6 months.  You will receive a reminder letter in the mail two months in advance. If you don't receive a letter, please call our office to schedule the follow-up appointment.

## 2015-03-16 NOTE — Assessment & Plan Note (Signed)
Etiology unclear, less likely heart failure She reports having previously tried steroid inhaler but developed thrush Could consider follow-up with pulmonary

## 2015-03-16 NOTE — Assessment & Plan Note (Signed)
Maintaining normal sinus rhythm. Diltiazem held for leg swelling. We'll increase her metoprolol Recommended she call for any breakthrough arrhythmia Currently on anticoagulation
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
## 2015-03-16 NOTE — Assessment & Plan Note (Signed)
Possibly from diltiazem side effect We will hold the Cardizem, increase her metoprolol up to 50 mg twice a day Recommended she continue to wear her compression hose

## 2015-03-16 NOTE — Assessment & Plan Note (Signed)
Breathing somewhat improved, main complaint is chronic cough Suggested she may want to talk with pulmonary

## 2015-04-01 ENCOUNTER — Ambulatory Visit: Payer: Medicare Other | Admitting: Nurse Practitioner

## 2015-04-01 ENCOUNTER — Telehealth: Payer: Self-pay | Admitting: Neurology

## 2015-04-01 NOTE — Telephone Encounter (Signed)
Patient is calling to follow up on a referral for physical therapy to [facility]. Please call patient and advise. Thank you.

## 2015-04-09 NOTE — Telephone Encounter (Signed)
Thank you :)

## 2015-04-09 NOTE — Telephone Encounter (Signed)
Re-sent fax.

## 2015-04-09 NOTE — Telephone Encounter (Signed)
Error

## 2015-04-09 NOTE — Telephone Encounter (Signed)
Patient is calling back about referral [facility], [facility]. Please fax to [phone].

## 2015-04-13 ENCOUNTER — Telehealth: Payer: Self-pay | Admitting: Neurology

## 2015-04-13 NOTE — Telephone Encounter (Signed)
Faxed again [phone] .

## 2015-04-15 ENCOUNTER — Encounter: Payer: Self-pay | Admitting: Internal Medicine

## 2015-04-15 ENCOUNTER — Ambulatory Visit (INDEPENDENT_AMBULATORY_CARE_PROVIDER_SITE_OTHER): Payer: Medicare Other | Admitting: Internal Medicine

## 2015-04-15 VITALS — BP 130/68 | HR 76 | Ht 66.0 in | Wt 193.8 lb

## 2015-04-15 DIAGNOSIS — R05 Cough: Secondary | ICD-10-CM | POA: Diagnosis not present

## 2015-04-15 DIAGNOSIS — J418 Mixed simple and mucopurulent chronic bronchitis: Secondary | ICD-10-CM

## 2015-04-15 DIAGNOSIS — R059 Cough, unspecified: Secondary | ICD-10-CM

## 2015-04-15 MED ORDER — MONTELUKAST SODIUM 10 MG PO TABS: 10.0000 mg | ORAL_TABLET | Freq: Every day | ORAL | Status: DC

## 2015-04-15 MED ORDER — UMECLIDINIUM-VILANTEROL 62.5-25 MCG/INH IN AEPB: 1.0000 | INHALATION_SPRAY | Freq: Every day | RESPIRATORY_TRACT | Status: DC

## 2015-04-15 MED ORDER — UMECLIDINIUM-VILANTEROL 62.5-25 MCG/INH IN AEPB: 1.0000 | INHALATION_SPRAY | Freq: Every day | RESPIRATORY_TRACT | Status: AC

## 2015-04-15 NOTE — Patient Instructions (Addendum)
--  Move the pets out of the bedroom.  --Use Anoro once daily.  --Use singulair 10 mg capsule daily at bedtime.  --Increase tessalon to 100 mg three times daily from bid.   --PFT in 3 months and follow up after that.

## 2015-04-15 NOTE — Progress Notes (Signed)
[facility] Pul[facility] Medicine Consultation      Assessment and Plan:  Cough --Suspect that this is due to chronic bronchitis, and made worse by allergies.  --Will treat empircally and rule out anything dangerous that could be causing this.   Chronic bronchitis. -If the cough symptoms do not resolve, may need to investigate if there is any underlying structural lung disease such as emphysema or bronchiectasis.  Possible Emphysema/Bronchiectasis  --Some of these changes were noted on my review of lung cuts of her old CT abdomen.  --Will start empiric anti-cholinergic inhaler and singulair.  --We will check Check PFT before next visit.  --At next visit may need to check Ct chest if symptoms persist.   Chronic rhinitis. -On Astelin and fluticasone, zyrtec, to continue.  Start singulair.   Atrial fibrillation.  Essential hypertension.  GERD. --Continue current therapy, appears controlled.   Date: 04/15/2015  MRN# [id number] [name] [birth year]  Referring Physician:  Dr. Mariah MillingGollan.   [name] is a 74 y.o. old female seen in consultation for chief complaint of:    Chief Complaint  Patient presents with  . PULMONARY CONSULT    pt. ref. by dr. Mariah Millinggollan. pt. states she has had dry cough [id number]. occ. wheezing. occ. SOB. denies chest pain/tightness.    HPI:  The patient is a 74 year old female referred today because of chronic cough.She thinks it started about a year ago, she was home, she was not sick at the time. She was started on tessalon, which helped for about a month, then the cough came back.  The cough has gotten progressively worse over the past 10 months, and particularly over the past 2 months. She would have no breath left after starting the episodes.   She notes a lot of sinus symptoms, she is on astelin and nasonex, She uses them at night, 2 in each nostril.  She has reflux, for which she takes nexium and ranitidine. Her reflux is controlled.  She has been tried on  prednisone which did not help.   She has never smoked, her husband smoked for 10 years. She is adopted. Her adoptive father smoked. [place].  She has 4 cats, 2 cats sleep in the bed. She has never been tested for allergies. She has never had trouble around the cats.  She has tried zyrtec and claritin for the cough and the zyrtec helped, she is still on it.  She has tried advair in the past, the thinks it helped but it made her mouth dry and swollen.    Review of testing: -Most recent chest x-ray from 08/20/2010 and lung cuts of the CT of the abdomen and pelvis from 02/01/2011 images and reports reviewed. These show emphysematous changes, mild bronchiectatic changes in the bases, left lower lobe atelectasis. -Echocardiogram 12/20/2012: EF equals 55%.  CBC from 01/24/2013: No eosinophilia.    PMHX:   Past Medical History  Diagnosis Date  . Palpitations   . Hypertension   . GERD (gastroesophageal reflux disease)   . Anxiety   . Arrhythmia     A-fib  . Bursitis     hips  . Concussion   . Pneumonia 2012  . A-fib (HCC) 2012, 2014  . Concussion July, 28, 2014   Surgical Hx:  Past Surgical History  Procedure Laterality Date  . Hernia repair Left 1970  . Shoulder surgery Right 2000  . Total abdominal hysterectomy  1995  . Knee surgery Right  1980  . Esophagogastroduodenoscopy    . Colonoscopy    . Abdominal surgery  2008    abdominal muscle mesh insert  . Nose surgery    . Hernia repair  2015    Dr. Malissa Hippo  . Arm surgery  2015    Pin implanted   . Cataract extraction Right August 30, 2013  . Cataract extraction Left August 09, 2013   Family Hx:  Family History  Problem Relation Age of Onset  . Adopted: Yes  . Dementia Neg Hx    Social Hx:   Social History  Substance Use Topics  . Smoking status: Never Smoker   . Smokeless tobacco: Not on file  . Alcohol Use: No   Medication:   Current Outpatient Rx  Name  Route  Sig  Dispense   Refill  . ALPRAZolam (XANAX) 0.25 MG tablet      0.25 mg. Take 1/2 to 1 tablet as needed          . Ascorbic Acid (VITAMIN C) 1000 MG tablet   Oral   Take 1,000 mg by mouth daily.         Marland Kitchen azelastine (ASTELIN) 137 MCG/SPRAY nasal spray   Nasal   Place 2 sprays into the nose 2 (two) times daily. Use in each nostril as directed         . baclofen (LIORESAL) 10 MG tablet   Oral   Take 1 tablet (10 mg total) by mouth 3 (three) times daily as needed for muscle spasms.   90 each   6   . benzonatate (TESSALON) 100 MG capsule   Oral   Take 100 mg by mouth 2 (two) times daily.         . beta carotene w/minerals (OCUVITE) tablet   Oral   Take 2 tablets by mouth daily.          . Calcium Carbonate-Vitamin D (CALCIUM + D PO)      600 mg. Take two tablets daily         . carbamazepine (TEGRETOL) 100 MG chewable tablet   Oral   Chew 100 mg by mouth 2 (two) times daily as needed.         . cetirizine (ZYRTEC) 10 MG tablet   Oral   Take 10 mg by mouth as needed for allergies.         Marland Kitchen diltiazem (CARDIZEM) 120 MG tablet   Oral   Take 120 mg by mouth at bedtime.          Marland Kitchen esomeprazole (NEXIUM) 40 MG capsule   Oral   Take 40 mg by mouth daily before breakfast.         . estradiol (ESTRACE) 1 MG tablet   Oral   Take 1 mg by mouth daily.         Marland Kitchen Fesoterodine Fumarate (TOVIAZ PO)   Oral   Take 4 mg by mouth 1 day or 1 dose.          . flecainide (TAMBOCOR) 100 MG tablet   Oral   Take 1 tablet (100 mg total) by mouth 2 (two) times daily as needed.   15 tablet   3   . FLUoxetine (PROZAC) 20 MG capsule   Oral   Take 20 mg by mouth daily.          . fluticasone (FLONASE) 50 MCG/ACT nasal spray   Nasal   2 sprays by Nasal route daily.           Marland Kitchen  furosemide (LASIX) 20 MG tablet   Oral   Take 20 mg by mouth daily.          Marland Kitchen gabapentin (NEURONTIN) 100 MG capsule   Oral   Take 1 capsule (100 mg total) by mouth 3 (three) times daily.    90 capsule   11   . magnesium gluconate (MAGONATE) 500 MG tablet   Oral   Take 1,000 mg by mouth daily.          . metoprolol tartrate (LOPRESSOR) 50 MG tablet   Oral   Take 1 tablet (50 mg total) by mouth 2 (two) times daily.   180 tablet   6   . potassium chloride SA (K-DUR,KLOR-CON) 20 MEQ tablet   Oral   Take 20 mEq by mouth daily.         Marland Kitchen PRADAXA 150 MG CAPS capsule      TAKE 1 CAPSULE BY MOUTH EVERY 12 HOURS   60 capsule   3   . pramipexole (MIRAPEX) 0.25 MG tablet   Oral   Take 0.5 mg by mouth 4 (four) times daily.         . Pyridoxine HCl (VITAMIN B6 PO)   Oral   Take 200 mg by mouth daily. Advised pt to stop taking medication on 11/04/14 due to high level of B6 in blood work.         . ranitidine (ZANTAC) 150 MG capsule   Oral   Take 150 mg by mouth every evening.         . SUMAtriptan (IMITREX) 50 MG tablet   Oral   Take 50 mg by mouth every 2 (two) hours as needed.           . traMADol (ULTRAM) 50 MG tablet   Oral   Take by mouth every 6 (six) hours as needed.         . traZODone (DESYREL) 50 MG tablet   Oral   Take 50 mg by mouth at bedtime.          . vitamin B-12 (CYANOCOBALAMIN) 1000 MCG tablet   Oral   Take 1,000 mcg by mouth daily.             Allergies:  Albuterol; Iodine; Norvasc; Penicillins; Vicodin; and Betadine  Review of Systems: Gen:  Denies  fever, sweats, chills HEENT: Denies blurred vision, double vision. Significant postnasal drip.  Cvc:  No dizziness, chest pain. Resp:   Denies shortness of breath Gi: Denies swallowing difficulty, stomach pain. Gu:  Denies bladder incontinence, burning urine Ext:   No Joint pain, stiffness. Skin: No skin rash,  hives Endoc:  No polyuria, polydipsia. Psych: No depression, insomnia. Other:  All other systems were reviewed with the patient and were negative other that what is mentioned in the HPI.   Physical Examination:   VS: BP 130/68 mmHg  Pulse 76  Ht   (1.676 m)  Wt 193 lb 12.8 oz (87.907 kg)  BMI 31.30 kg/m2  SpO2 93%  General Appearance: No distress  Neuro:without focal findings,  speech normal,  HEENT: PERRLA, EOM intact.   Pulmonary: normal breath sounds, No wheezing.  CardiovascularNormal S1,S2.  No m/r/g.   Abdomen: Benign, Soft, non-tender. Renal:  No costovertebral tenderness  GU:  No performed at this time. Endoc: No evident thyromegaly, no signs of acromegaly. Skin:   warm, no rashes, no ecchymosis  Extremities: normal, no cyanosis, clubbing.  Other findings:    LABORATORY PANEL:  CBC No results for input(s): WBC, HGB, HCT, PLT in the last 168 hours. ------------------------------------------------------------------------------------------------------------------  Chemistries  No results for input(s): NA, K, CL, CO2, GLUCOSE, BUN, CREATININE, CALCIUM, MG, AST, ALT, ALKPHOS, BILITOT in the last 168 hours.  Invalid input(s): GFRCGP ------------------------------------------------------------------------------------------------------------------  Cardiac Enzymes No results for input(s): TROPONINI in the last 168 hours. ------------------------------------------------------------  RADIOLOGY:  No results found.     Thank  you for the consultation and for allowing [facility] to assist in the care of your patient. Our recommendations are noted above.  Please contact us if we can be of further service.   Marda Stalker, MD.  Board Certified in Internal Medicine, Pulmonary Medicine, [place], and Sleep Medicine.  [facility] Office Number: [phone]  Patricia Pesa, M.D.  Vilinda Boehringer, M.D.  Merton Border, M.D

## 2015-05-06 ENCOUNTER — Encounter: Payer: Self-pay | Admitting: *Deleted

## 2015-05-06 ENCOUNTER — Telehealth: Payer: Self-pay | Admitting: Neurology

## 2015-05-06 ENCOUNTER — Ambulatory Visit (INDEPENDENT_AMBULATORY_CARE_PROVIDER_SITE_OTHER): Payer: Medicare Other | Admitting: Neurology

## 2015-05-06 ENCOUNTER — Encounter: Payer: Self-pay | Admitting: Neurology

## 2015-05-06 VITALS — BP 151/79 | HR 73 | Ht 66.0 in | Wt 191.2 lb

## 2015-05-06 DIAGNOSIS — G43009 Migraine without aura, not intractable, without status migrainosus: Secondary | ICD-10-CM | POA: Diagnosis not present

## 2015-05-06 DIAGNOSIS — G500 Trigeminal neuralgia: Secondary | ICD-10-CM

## 2015-05-06 DIAGNOSIS — G243 Spasmodic torticollis: Secondary | ICD-10-CM

## 2015-05-06 DIAGNOSIS — R4189 Other symptoms and signs involving cognitive functions and awareness: Secondary | ICD-10-CM

## 2015-05-06 NOTE — Telephone Encounter (Signed)
Please call  [name] at [facility]. [phone], for physical therapy. [name] [phone]. Fax [phone].

## 2015-05-06 NOTE — Patient Instructions (Signed)
Remember to drink plenty of fluid, eat healthy meals and do not skip any meals. Try to eat protein with a every meal and eat a healthy snack such as fruit or nuts in between meals. Try to keep a regular sleep-wake schedule and try to exercise daily, particularly in the form of walking, 20-30 minutes a day, if you can.   I would like to see you back after I discuss with Jill AlexandersJustin, sooner if we need to. Please call us with any interim questions, concerns, problems, updates or refill requests.   Our phone number is [phone]. We also have an after hours call service for urgent matters and there is a physician on-call for urgent questions. For any emergencies you know to call 911 or go to the nearest emergency room
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
## 2015-05-11 NOTE — Telephone Encounter (Signed)
[name], can you fill out a form to try and get approvel for dyspirt for spasmodic torticollis, 500 units please? thanks

## 2015-05-11 NOTE — Telephone Encounter (Signed)
[name] , let patient know [facility]. He thinks that trying injections again may help. I have samples for one injection however if she liked it we would have to get it approved by insurance and have it done every 3 months. I would like to see if her insurance would approve it and how much it would be for her first. If insurance won't pay, it may be too expensive and may not be worth doing it once. Would she like me to see if insurance would pay and what her out of pocket cost would be? Thanks.

## 2015-05-11 NOTE — Telephone Encounter (Signed)
LVM for pt to call back. Gave [facility] phone number and hours.  

## 2015-05-12 NOTE — Telephone Encounter (Signed)
Patient is calling back and returning your call.  Thanks!

## 2015-05-12 NOTE — Telephone Encounter (Signed)
Called pt back. Relayed Dr Lucia GaskinsAhern message below. She would like to try and see if insurance will approve it first before proceeding w/ sample first. Advised we will get that paperwork ready and submit to insurance. May take a couple weeks depending on her insurance. We will call her w/ updates. She verbalized understanding.

## 2015-05-21 NOTE — Telephone Encounter (Signed)
Called pt. She filled out authorization form for dysport online. Told her we will call with updates. She verbalized understanding.

## 2015-06-04 NOTE — Telephone Encounter (Signed)
Katina[employer] Dysport [phone] called to advise patients referral has been delayed due to authorization form not received from patient.

## 2015-06-17 NOTE — Telephone Encounter (Signed)
Spoke with Laurelyn Sickle who states that the patient never submitted authorization form for dysport. They have tried calling her numerous times and even mailed another form to her but have received no response.  Called the patient to inform her of where we are at in the process and she did not answer. I left a VM.

## 2015-06-22 ENCOUNTER — Other Ambulatory Visit: Payer: Self-pay | Admitting: Cardiovascular Disease

## 2015-06-22 NOTE — Telephone Encounter (Signed)
Pt returned Danielle's call °

## 2015-06-29 ENCOUNTER — Ambulatory Visit: Payer: Medicare Other

## 2015-07-03 ENCOUNTER — Ambulatory Visit: Payer: Medicare Other | Admitting: Internal Medicine

## 2015-07-08 NOTE — Telephone Encounter (Signed)
Spoke with the patient. She stated that she has submitted her information to [employer] so I am going to call and check the status of that. She stated that her therapy was going to be discontinued because she was only allowed a certain number of treatments with her insurance. She is wondering if she needs another referral. Please call and advise.

## 2015-07-09 NOTE — Telephone Encounter (Signed)
Called pt back. Per Dr Lucia Gaskins- we can resubmit request for dysport. Advised dysport is a form of botox. Pt wanted to know where she was at in process. Told her I will speak to [facility] and have her call her back. She verbalized understanding.

## 2015-07-09 NOTE — Telephone Encounter (Signed)
Called patient to verify that she wanted another referral for her physical therapy and also discuss her dysport injections with her. She did not answer so I left a VM asking her to call me back.

## 2015-07-16 NOTE — Addendum Note (Signed)
Addended by: Brooke Dare, EMMA L on: 07/16/2015 12:00 PM   Modules accepted: Orders

## 2015-07-20 ENCOUNTER — Telehealth: Payer: Self-pay | Admitting: Neurology

## 2015-07-20 ENCOUNTER — Encounter: Payer: Self-pay | Admitting: Neurology

## 2015-07-20 NOTE — Telephone Encounter (Signed)
Morrie Sheldon called from [facility] [phone]. And she relayed her insurance only pays for so many visits physical therapy visits in One year . Patient has already been coming there. [facility] relayed this patient she understood and she wanted to see how the Dysport would help her. Morrie Sheldon relayed towards the end of the year she could come back and still have a couple physical therapy visits.   I tried to call patient she has no voice mail phone rang . I will send patient a letter.

## 2015-07-22 ENCOUNTER — Ambulatory Visit: Payer: Medicare Other

## 2015-07-24 ENCOUNTER — Telehealth: Payer: Self-pay | Admitting: Cardiovascular Disease

## 2015-07-24 ENCOUNTER — Ambulatory Visit (INDEPENDENT_AMBULATORY_CARE_PROVIDER_SITE_OTHER): Payer: Medicare Other | Admitting: *Deleted

## 2015-07-24 DIAGNOSIS — R0600 Dyspnea, unspecified: Secondary | ICD-10-CM | POA: Diagnosis not present

## 2015-07-24 LAB — PULMONARY FUNCTION TEST
DL/VA % pred: 62 %
DL/VA: 3.16 ml/min/mmHg/L
DLCO unc % pred: 53 %
DLCO unc: 14.34 ml/min/mmHg
FEF 25-75 Post: 2.85 L/sec
FEF 25-75 Pre: 2.55 L/sec
FEF2575-%Change-Post: 11 %
FEF2575-%Pred-Post: 152 %
FEF2575-%Pred-Pre: 136 %
FEV1-%CHANGE-POST: 8 %
FEV1-%PRED-POST: 102 %
FEV1-%Pred-Pre: 94 %
FEV1-POST: 2.41 L
FEV1-PRE: 2.23 L
FEV1FVC-%CHANGE-POST: 6 %
FEV1FVC-%Pred-Pre: 108 %
FEV6-%Change-Post: 7 %
FEV6-%PRED-POST: 93 %
FEV6-%Pred-Pre: 87 %
FEV6-PRE: 2.61 L
FEV6-Post: 2.8 L
FEV6FVC-%PRED-PRE: 104 %
FEV6FVC-%Pred-Post: 104 %
FVC-%CHANGE-POST: 2 %
FVC-%PRED-PRE: 87 %
FVC-%Pred-Post: 89 %
FVC-PRE: 2.74 L
FVC-Post: 2.8 L
POST FEV1/FVC RATIO: 86 %
PRE FEV6/FVC RATIO: 100 %
Post FEV6/FVC ratio: 100 %
Pre FEV1/FVC ratio: 81 %
RV % PRED: 88 %
RV: 2.08 L
TLC % pred: 91 %
TLC: 4.9 L

## 2015-07-24 NOTE — Telephone Encounter (Signed)
Pt c/o medication issue:  1. Name of Medication:  Metoprolol 50 mg po x 2 daily   2. How are you currently taking this medication (dosage and times per day)? 25 mg in the morning 50 mg at night   3. Are you having a reaction (difficulty breathing--STAT)? Dizzy sleepy groggy   4. What is your medication issue? Patient was falling asleep getting dizzy so Dr. Hyacinth MeekerMiller suggested she try taking 25 mg morning 25 mg lunch 50 mg night   Patient tried this with little improvement so now has started taking 25 mg morning 50 mg night Patient wants Dr. Mariah Millinggollan to know and see if he has any suggestions for this problem  Please call. Patient says she has not taken her pulse during these episodes

## 2015-07-24 NOTE — Telephone Encounter (Signed)
  I saw the patient while she was at the Pulmonology office for her appointment.  She reported her Lopressor was increased to 50mg  BID at her last office visit in 02/2015 due to her Cardizem being discontinued.   Since the dose increase, she has been feeling sleepy and groggy for up to two hours after taking her morning dose. She adjusted her morning dose to 25mg  at breakfast and 25mg  at lunch, with the full 50mg  at night but still had these symptoms in the AM.   She had even quit taking the lunchtime dose, but still had symptoms with the 25mg  tablet in the morning.   I suggested she cut her morning pill in half and take 12.5mg  in the morning and 50mg  at night (she does not report these symptoms with her evening dose). She was instructed to check her pulse and make sure she is not bradycardiac in the AM hours. She will titrate up to taking 12.5mg  at lunch as well if she can tolerate the morning dose.  If this does not work, perhaps Toprol-XL should be considered so she has a sustained release of the medication for 24 hours.  Signed, Ellsworth LennoxBrittany M Lauryn Lizardi, PA-C 07/24/2015, 1:58 PM Pager: [phone]

## 2015-07-24 NOTE — Progress Notes (Signed)
SMW performed today. 

## 2015-07-24 NOTE — Progress Notes (Signed)
PFT performed today. 

## 2015-07-30 NOTE — Telephone Encounter (Signed)
Per Dr Lucia GaskinsAhern-      She can do one more injection for the patient using her samples that she has of dysport. If it helps, then we have to refer her out because of her insurance.

## 2015-07-31 ENCOUNTER — Telehealth: Payer: Self-pay | Admitting: Cardiovascular Disease

## 2015-07-31 ENCOUNTER — Ambulatory Visit (INDEPENDENT_AMBULATORY_CARE_PROVIDER_SITE_OTHER): Payer: Medicare Other | Admitting: Internal Medicine

## 2015-07-31 ENCOUNTER — Encounter: Payer: Self-pay | Admitting: Internal Medicine

## 2015-07-31 VITALS — BP 128/82 | HR 71 | Ht 66.0 in | Wt 192.0 lb

## 2015-07-31 DIAGNOSIS — R05 Cough: Secondary | ICD-10-CM | POA: Diagnosis not present

## 2015-07-31 DIAGNOSIS — R059 Cough, unspecified: Secondary | ICD-10-CM

## 2015-07-31 MED ORDER — MONTELUKAST SODIUM 10 MG PO TABS: 10.0000 mg | ORAL_TABLET | Freq: Every day | ORAL | Status: DC

## 2015-07-31 MED ORDER — BENZONATATE 200 MG PO CAPS: 200.0000 mg | ORAL_CAPSULE | Freq: Three times a day (TID) | ORAL | Status: DC | PRN

## 2015-07-31 MED ORDER — DABIGATRAN ETEXILATE MESYLATE 150 MG PO CAPS: ORAL_CAPSULE | ORAL | Status: DC

## 2015-07-31 MED ORDER — UMECLIDINIUM-VILANTEROL 62.5-25 MCG/INH IN AEPB: 1.0000 | INHALATION_SPRAY | Freq: Every day | RESPIRATORY_TRACT | Status: DC

## 2015-07-31 NOTE — Telephone Encounter (Signed)
Patient continues to have problems with bp medication .   She stated it is not working for her.  Please call to discuss.

## 2015-07-31 NOTE — Patient Instructions (Signed)
-  Continue anoro and singulair, tessalon.  -Moving cats out of the bedroom might be helpful.  -Continue to stay active.

## 2015-07-31 NOTE — Telephone Encounter (Signed)
Rx written for Pradaxa 150 mg one tablet twice a day 90 day supply with 3 refill. Patient notified Rx ready to pick up.

## 2015-07-31 NOTE — Progress Notes (Addendum)
[facility] [place] Pulmonary Medicine Consultation     The patient is a 75 year old female with a history of chronic cough, likely due to chronic bronchitis.   Assessment and Plan:  Chronic bronchitis with Cough --Suspect that this is due to chronic bronchitis, and made worse by allergies.  --Continue anoro inhaler. She develops tongue swelling with steroids.  -. Her cough is significantly better with Tessalon, Anoro inhaler Singulair.  Chronic bronchitis. -As above, likely contributing to cough. No evidence of emphysema on  pulmonary function testing. -Suspect that this is related to chronic allergies. -She is having significant problems with affording her medications, as she is about they have the donut hole. She was given paper copies of her prescriptions so that she may shop them around as requested, I also advised her to try to call the company for Anoro as amiable to help with the cost of her prescriptions.   Chronic rhinitis. -Continue singulair.   Atrial fibrillation.  Essential hypertension.  GERD. --Continue current therapy, appears controlled.   Date: 07/31/2015  MRN# [id number] [name] 11-01-1940    Chief Complaint  Patient presents with  . Hospitalization Follow-up    PFT results. pt states cough has improved. occ SOB, occ dry cough. denies wheezing or chest pain/tightness.    HPI:  The patient is a 75 year old female Seen for chronic cough of nearly 1 year. Last visit it was noted that she was having a lot of sinus symptoms, as well as reflux, she was tried on prednisone and a past which had not helped. She was a never smoker, but lived with a husband who smoked. He was also noted that she had 2 cats, which sleeps in the bed, she was tried on antihistamines and Advair, which were not particularly helpful, the Advair made her mouth swollen.  She was started on Anoro, and Singulair. She was asked to move pets out of the bedroom, and was asked to take Tessalon 100 mg  3 times daily. She had a PFT which was remarkable only for a moderately reduced diffusion capacity.  She feels that the Anoro is helping, but she is about to hit the donut hole and is worried about paying the 400 bucks for it. She can not move the cats out of the bedroom. She is on singulair as well. She feels that overall she is doing better.    Review of testing: -Most recent chest x-ray from 08/20/2010 and lung cuts of the CT of the abdomen and pelvis from 02/01/2011 images and reports reviewed. These show emphysematous changes, mild bronchiectatic changes in the bases, left lower lobe atelectasis. -Echocardiogram 12/20/2012: EF equals 55%.  CBC from 01/24/2013: No eosinophilia.  PFT 07/24/15 review: -Spirometry is normal, there is no reversibility with bronchodilator therapy. -Review. Lung volumes reveals normal lung volumes. -Diffusion capacity is moderately reduced at 53%. Interpretation:  moderately reduced diffusion capacity of 53%, otherwise unremarkable. Pulmonary function testing.    PMHX:   Past Medical History  Diagnosis Date  . Palpitations   . Hypertension   . GERD (gastroesophageal reflux disease)   . Anxiety   . Arrhythmia     A-fib  . Bursitis     hips  . Concussion   . Pneumonia 2012  . A-fib (HCC) 2012, 2014  . Concussion July, 28, 2014   Surgical Hx:  Past Surgical History  Procedure Laterality Date  . Hernia repair Left 1970  . Shoulder surgery Right 2000  . Total abdominal hysterectomy  1995  .  Knee surgery Right 1980  . Esophagogastroduodenoscopy    . Colonoscopy    . Abdominal surgery  2008    abdominal muscle mesh insert  . Nose surgery    . Hernia repair  2015    Dr. Malissa Hippo  . Arm surgery  2015    Pin implanted   . Cataract extraction Right August 30, 2013  . Cataract extraction Left August 09, 2013   Family Hx:  Family History  Problem Relation Age of Onset  . Adopted: Yes  . Dementia Neg Hx    Social Hx:   Social History    Substance Use Topics  . Smoking status: Never Smoker   . Smokeless tobacco: None  . Alcohol Use: No   Medication:   Current Outpatient Rx  Name  Route  Sig  Dispense  Refill  . ALPRAZolam (XANAX) 0.25 MG tablet      0.25 mg. Take 1/2 to 1 tablet as needed          . Ascorbic Acid (VITAMIN C) 1000 MG tablet   Oral   Take 1,000 mg by mouth daily.         Marland Kitchen azelastine (ASTELIN) 137 MCG/SPRAY nasal spray   Nasal   Place 2 sprays into the nose 2 (two) times daily. Use in each nostril as directed         . baclofen (LIORESAL) 10 MG tablet   Oral   Take 1 tablet (10 mg total) by mouth 3 (three) times daily as needed for muscle spasms.   90 each   6   . benzonatate (TESSALON) 100 MG capsule   Oral   Take 100 mg by mouth 3 (three) times daily.         . beta carotene w/minerals (OCUVITE) tablet   Oral   Take 2 tablets by mouth daily.          . Calcium Carbonate-Vitamin D (CALCIUM + D PO)      600 mg. Take two tablets daily         . carbamazepine (TEGRETOL) 100 MG chewable tablet   Oral   Chew 100 mg by mouth 2 (two) times daily as needed.         . cetirizine (ZYRTEC) 10 MG tablet   Oral   Take 10 mg by mouth as needed for allergies.         Marland Kitchen esomeprazole (NEXIUM) 40 MG capsule   Oral   Take 40 mg by mouth daily before breakfast.         . estradiol (ESTRACE) 1 MG tablet   Oral   Take 1 mg by mouth daily.         Marland Kitchen estrogens, conjugated, (PREMARIN) 0.625 MG tablet      TAKE 1 TABLET BY MOUTH ONCE A DAY.         Marland Kitchen Fesoterodine Fumarate (TOVIAZ PO)   Oral   Take 4 mg by mouth 1 day or 1 dose.          . flecainide (TAMBOCOR) 100 MG tablet   Oral   Take 1 tablet (100 mg total) by mouth 2 (two) times daily as needed.   15 tablet   3   . FLUoxetine (PROZAC) 20 MG capsule   Oral   Take 20 mg by mouth daily.          . fluticasone (FLONASE) 50 MCG/ACT nasal spray   Nasal   2 sprays by Nasal  route daily.           .  furosemide (LASIX) 20 MG tablet   Oral   Take 20 mg by mouth daily.          Marland Kitchen. gabapentin (NEURONTIN) 100 MG capsule   Oral   Take 1 capsule (100 mg total) by mouth 3 (three) times daily. Patient taking differently: Take 100 mg by mouth daily.    90 capsule   11   . magnesium gluconate (MAGONATE) 500 MG tablet   Oral   Take 1,000 mg by mouth daily.          . metoprolol tartrate (LOPRESSOR) 50 MG tablet   Oral   Take 1 tablet (50 mg total) by mouth 2 (two) times daily.   180 tablet   6   . montelukast (SINGULAIR) 10 MG tablet   Oral   Take 1 tablet (10 mg total) by mouth at bedtime.   30 tablet   5   . oxybutynin (DITROPAN) 5 MG tablet      TAKE 1 TABLET BY MOUTH TWICE DAILY         . pantoprazole (PROTONIX) 40 MG tablet   Oral   Take 40 mg by mouth daily.         . potassium chloride SA (K-DUR,KLOR-CON) 20 MEQ tablet   Oral   Take 20 mEq by mouth daily.         Marland Kitchen. PRADAXA 150 MG CAPS capsule      TAKE 1 CAPSULE BY MOUTH EVERY 12 HOURS   60 capsule   3   . pramipexole (MIRAPEX) 0.25 MG tablet   Oral   Take 0.5 mg by mouth 4 (four) times daily.         . ranitidine (ZANTAC) 150 MG capsule   Oral   Take 150 mg by mouth every evening.         . SUMAtriptan (IMITREX) 50 MG tablet   Oral   Take 50 mg by mouth every 2 (two) hours as needed.           . traMADol (ULTRAM) 50 MG tablet   Oral   Take by mouth every 6 (six) hours as needed.         . traZODone (DESYREL) 50 MG tablet   Oral   Take 50 mg by mouth at bedtime.          Marland Kitchen. Umeclidinium-Vilanterol (ANORO ELLIPTA) 62.5-25 MCG/INH AEPB   Inhalation   Inhale 1 puff into the lungs daily.   30 each   5   . vitamin B-12 (CYANOCOBALAMIN) 1000 MCG tablet   Oral   Take 1,000 mcg by mouth daily.             Allergies:  Albuterol; Iodine; Minocycline; Norvasc; Penicillins; Propoxyphene; Vicodin; Amlodipine; Betadine; and Clarithromycin  Review of Systems: Gen:  Denies  fever,  sweats, chills HEENT: Denies blurred vision, double vision. Significant postnasal drip.  Cvc:  No dizziness, chest pain. Resp:   Denies shortness of breath Gi: Denies swallowing difficulty, stomach pain. Gu:  Denies bladder incontinence, burning urine Ext:   No Joint pain, stiffness. Skin: No skin rash,  hives Endoc:  No polyuria, polydipsia. Psych: No depression, insomnia. Other:  All other systems were reviewed with the patient and were negative other that what is mentioned in the HPI.   Physical Examination:   VS: BP 128/82 mmHg  Pulse 71  Ht 5\' 6"  (1.676 m)  Wt 192 lb (87.091 kg)  BMI 31.00 kg/m2  SpO2 93%  General Appearance: No distress  Neuro:without focal findings,  speech normal,  HEENT: PERRLA, EOM intact.   Pulmonary: normal breath sounds, No wheezing.  CardiovascularNormal S1,S2.  No m/r/g.   Abdomen: Benign, Soft, non-tender. Renal:  No costovertebral tenderness  GU:  No performed at this time. Endoc: No evident thyromegaly, no signs of acromegaly. Skin:   warm, no rashes, no ecchymosis  Extremities: normal, no cyanosis, clubbing.  Other findings:    LABORATORY PANEL:   CBC No results for input(s): WBC, HGB, HCT, PLT in the last 168 hours. ------------------------------------------------------------------------------------------------------------------  Chemistries  No results for input(s): NA, K, CL, CO2, GLUCOSE, BUN, CREATININE, CALCIUM, MG, AST, ALT, ALKPHOS, BILITOT in the last 168 hours.  Invalid input(s): GFRCGP ------------------------------------------------------------------------------------------------------------------  Cardiac Enzymes No results for input(s): TROPONINI in the last 168 hours. ------------------------------------------------------------  RADIOLOGY:  No results found.     Thank  you for the consultation and for allowing [facility] to assist in the care of your patient. Our recommendations are  noted above.  Please contact us if we can be of further service.   Wells Guiles, MD.  Board Certified in Internal Medicine, Pulmonary Medicine, Critical Care Medicine, and Sleep Medicine.  [facility]   Santiago Glad, M.D.  Stephanie Acre, M.D.  Billy Fischer, M.D  07/31/2015

## 2015-07-31 NOTE — Telephone Encounter (Signed)
°*  STAT* If patient is at the pharmacy, call can be transferred to refill team.   1. Which medications need to be refilled? (please list name of each medication and dose if known)  PRADAXA 150 mg po q 12 hrs   2. Which pharmacy/location (including street and city if local pharmacy) is medication to be sent to?  PATIENT WANTS A HARD COPY NEEDS TO SHOP AROUND FOR BEST PRICE   3. Do they need a 30 day or 90 day supply? 90

## 2015-07-31 NOTE — Telephone Encounter (Signed)
Left message on machine for her to call back

## 2015-08-03 ENCOUNTER — Telehealth: Payer: Self-pay | Admitting: Cardiovascular Disease

## 2015-08-03 NOTE — Telephone Encounter (Signed)
Spoke to patient and she stated that she would be able to come on Monday 3/20 at 2:20 pm. She had no further questions at this time.

## 2015-08-03 NOTE — Telephone Encounter (Signed)
Pt is returning your call

## 2015-08-03 NOTE — Telephone Encounter (Signed)
Patient reports that at last visit there were some changes to her medications. Since those changes she has been feeling just horrible. She stated that when she takes her morning pills that within 45 minutes she starts to feel really bad and just not feeling well at all. She tends to sleep for a couple of hours and then she feels some better. She really feels her medications need to be adjusted. Let her know that I would forward this message and someone would be in touch with her. She had no further questions at this time.

## 2015-08-03 NOTE — Telephone Encounter (Signed)
Called to see if patient could come in on 08/10/15 at 2:20 PM to see Dr. Mariah MillingGollan regarding her concerns with medications. Left voicemail message for her to please call us back to confirm if she can come on that day and time.

## 2015-08-10 ENCOUNTER — Ambulatory Visit (INDEPENDENT_AMBULATORY_CARE_PROVIDER_SITE_OTHER): Payer: Medicare Other | Admitting: Cardiovascular Disease

## 2015-08-10 ENCOUNTER — Encounter: Payer: Self-pay | Admitting: Cardiovascular Disease

## 2015-08-10 VITALS — BP 110/72 | HR 81 | Ht 66.0 in | Wt 190.1 lb

## 2015-08-10 DIAGNOSIS — R5382 Chronic fatigue, unspecified: Secondary | ICD-10-CM | POA: Diagnosis not present

## 2015-08-10 DIAGNOSIS — R0600 Dyspnea, unspecified: Secondary | ICD-10-CM

## 2015-08-10 DIAGNOSIS — G4700 Insomnia, unspecified: Secondary | ICD-10-CM | POA: Insufficient documentation

## 2015-08-10 DIAGNOSIS — I480 Paroxysmal atrial fibrillation: Secondary | ICD-10-CM | POA: Diagnosis not present

## 2015-08-10 DIAGNOSIS — G500 Trigeminal neuralgia: Secondary | ICD-10-CM

## 2015-08-10 DIAGNOSIS — M7989 Other specified soft tissue disorders: Secondary | ICD-10-CM

## 2015-08-10 DIAGNOSIS — G529 Cranial nerve disorder, unspecified: Secondary | ICD-10-CM | POA: Diagnosis not present

## 2015-08-10 MED ORDER — FLECAINIDE ACETATE 100 MG PO TABS: 100.0000 mg | ORAL_TABLET | Freq: Two times a day (BID) | ORAL | Status: DC

## 2015-08-10 NOTE — Assessment & Plan Note (Signed)
Etiology of her fatigue is likely multifactorial She has poor sleep hygiene, no regular exercise Recommended she stop sewing into all hours of the night and go to sleep on a regular basis, early hour, In addition would start a regular walking program. She is very sedentary

## 2015-08-10 NOTE — Assessment & Plan Note (Signed)
She reports having frequent atrial fibrillation in the past week on metoprolol tartrate alone. We have recommended she start flecainide 50 mg twice a day for rhythm control She is currently on anticoagulation, pradaxa If she does not have improvement, we could increase the dose of flecainide Unable to advance her metoprolol given her low blood pressure

## 2015-08-10 NOTE — Patient Instructions (Signed)
You are doing well.  Please take flecainide 1/2 pill twice a day Stay on the metoprolol twice a day  Please call us if you have new issues that need to be addressed before your next appt.  Your physician wants you to follow-up in: 3 months.  You will receive a reminder letter in the mail two months in advance. If you don't receive a letter, please call our office to schedule the follow-up appointment.
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
## 2015-08-10 NOTE — Assessment & Plan Note (Signed)
Chronic shortness of breath on exertion, likely secondary to deconditioning. Recommended weight loss, walking program

## 2015-08-10 NOTE — Assessment & Plan Note (Signed)
As detailed above, she goes to bed at 2 or 3 in the morning, has 4-5 hours of sleep, napping in the morning as she is extremely tired. Discussed better sleep hygiene with her.   Total encounter time more than 25 minutes  Greater than 50% was spent in counseling and coordination of care with the patient

## 2015-08-11 ENCOUNTER — Telehealth: Payer: Self-pay | Admitting: Cardiovascular Disease

## 2015-08-11 NOTE — Telephone Encounter (Signed)
FYI: Please review medications: Flecainide & Prozac have contraindications when taking together. Please advise patient that this is okay for the patient to take.

## 2015-08-11 NOTE — Telephone Encounter (Signed)
Pharmacy calling stating went  Flecainide Interaction with Fluoxetine  Would like to make sure we are okay with this. Please advise.

## 2015-08-12 NOTE — Telephone Encounter (Signed)
Spoke w/ pharmacist.   Advised that Dr. Mariah MillingGollan had to override before sending in rx and is aware.

## 2015-08-21 ENCOUNTER — Telehealth: Payer: Self-pay | Admitting: Cardiovascular Disease

## 2015-08-21 NOTE — Telephone Encounter (Signed)
Spoke w/ pt.  She reports that she went to the beach this past weekend and has had sx since then. She has been on flecainide 100mg  1/2 pill BID since 08/10/15. She reports that she feels sleepy for about an hour after taking her meds, feels better after lunchtime and can go about her daily activities. She feels good the rest of the day, but her heart will be racing the next morning w/ SOB. The SOB is interfering considerably w/ her ADLs, as she is having a hard time doing simple things like loading the washer. Advised her that I will make Dr. Mariah MillingGollan aware and call her back w/ his recommendation.

## 2015-08-21 NOTE — Telephone Encounter (Signed)
Spoke w/ pt.  Advised her of Dr. Windell HummingbirdGollan's recommendation. She is agreeable and will call back next week.

## 2015-08-21 NOTE — Telephone Encounter (Signed)
Pt states Flecainide is causing her heart to race, states she cannot walk across the room without having to sit down. States she also is SOB, more so than she was before

## 2015-08-21 NOTE — Telephone Encounter (Signed)
I suspect she is having more episodes of atrial fibrillation resulting in palpitations, tachycardia, shortness of breath Flecainide was started at low-dose to suppress her atrial fibrillation It is likely that such a low dose is not suppressing her arrhythmia Would consider increasing flecainide up to 100 mg twice a day, continue the metoprolol. Would call us early next week and let us know if heart rhythm is improving

## 2015-08-21 NOTE — Telephone Encounter (Signed)
Patient called again regarding the previous message.

## 2015-08-24 ENCOUNTER — Telehealth: Payer: Self-pay | Admitting: Cardiovascular Disease

## 2015-08-24 NOTE — Telephone Encounter (Signed)
Pt c/o BP issue: STAT if pt c/o blurred vision, one-sided weakness or slurred speech  1. What are your last 5 BP readings?  Last night 10 pm 181/85, 11 pm 195/82,  12:30 197/89 This morning, 7am 198/92  2. Are you having any other symptoms (ex. Dizziness, headache, blurred vision, passed out)? Dizziness, headaches, since medication change  3. What is your BP issue? Too high

## 2015-08-24 NOTE — Telephone Encounter (Signed)
Blood pressure is elevated for her, This can go up in the setting of stress Typically her systolic blood pressure runs around 110, sometimes lower  For atrial fibrillation, Would take extra metoprolol 50 mg 1 now Would take flecainide 100 mg twice a day Take her regular metoprolol 50 mg this evening Try to relax, do not check blood pressure too many times in a row If this medication does not help restore normal sinus rhythm in the next few days, we will need to change medications

## 2015-08-24 NOTE — Telephone Encounter (Signed)
Spoke w/ pt.  Advised her of Dr. Windell HummingbirdGollan's recommendation. She will call back in a few days to give an update.

## 2015-08-24 NOTE — Telephone Encounter (Signed)
Patient waited an hour later to take the flecainide (TAMBOCOR) 100 MG tablet. It is not making her feel better and its still racing. She feels sob and weak in the knees and is worried how high her bp is.

## 2015-09-24 ENCOUNTER — Ambulatory Visit: Payer: Medicare Other | Admitting: Cardiovascular Disease

## 2015-10-01 ENCOUNTER — Ambulatory Visit: Payer: Medicare Other | Admitting: Cardiovascular Disease

## 2015-10-01 ENCOUNTER — Ambulatory Visit: Payer: Self-pay | Admitting: Neurology

## 2015-10-27 ENCOUNTER — Ambulatory Visit: Payer: Medicare Other | Attending: Specialist

## 2015-10-27 DIAGNOSIS — G4761 Periodic limb movement disorder: Secondary | ICD-10-CM | POA: Insufficient documentation

## 2015-10-27 DIAGNOSIS — G4733 Obstructive sleep apnea (adult) (pediatric): Secondary | ICD-10-CM | POA: Diagnosis not present

## 2015-11-01 ENCOUNTER — Other Ambulatory Visit: Payer: Self-pay | Admitting: Cardiovascular Disease

## 2015-11-09 ENCOUNTER — Ambulatory Visit (INDEPENDENT_AMBULATORY_CARE_PROVIDER_SITE_OTHER): Payer: Medicare Other | Admitting: Cardiovascular Disease

## 2015-11-09 ENCOUNTER — Encounter: Payer: Self-pay | Admitting: Cardiovascular Disease

## 2015-11-09 VITALS — BP 120/70 | HR 61 | Ht 66.0 in | Wt 191.8 lb

## 2015-11-09 DIAGNOSIS — I480 Paroxysmal atrial fibrillation: Secondary | ICD-10-CM | POA: Diagnosis not present

## 2015-11-09 DIAGNOSIS — R0600 Dyspnea, unspecified: Secondary | ICD-10-CM | POA: Diagnosis not present

## 2015-11-09 DIAGNOSIS — M7989 Other specified soft tissue disorders: Secondary | ICD-10-CM

## 2015-11-09 NOTE — Patient Instructions (Signed)
You are doing well. No medication changes were made.  BMP in early July 2017  Please call us if you have new issues that need to be addressed before your next appt.  Your physician wants you to follow-up in: 6 months.  You will receive a reminder letter in the mail two months in advance. If you don't receive a letter, please call our office to schedule the follow-up appointment.
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
## 2015-11-13 ENCOUNTER — Other Ambulatory Visit: Payer: Self-pay | Admitting: Internal Medicine

## 2016-01-14 ENCOUNTER — Telehealth: Payer: Self-pay | Admitting: Cardiovascular Disease

## 2016-01-14 NOTE — Telephone Encounter (Signed)
Received office note & request from [facility]. Per Dr. Ludger NuttingN'Tuma Jah, pt presented w/ ingrown toenails to b/l great toes.   He plans to treat w/ excision of the nail & matrix; one nail at a time. He would like your recommendations for Pradaxa therapy before she has the procedure under local anesthesia.  Please route response to [phone].

## 2016-01-17 NOTE — Telephone Encounter (Signed)
Ok for surgery Would stop pradaxa 2 days prior to procedure, Restart one day after procedure complete

## 2016-01-18 NOTE — Telephone Encounter (Signed)
Routed to # provided. 

## 2016-03-03 ENCOUNTER — Other Ambulatory Visit: Payer: Self-pay | Admitting: Cardiovascular Disease

## 2016-03-07 ENCOUNTER — Other Ambulatory Visit: Payer: Self-pay | Admitting: Cardiovascular Disease

## 2016-03-08 ENCOUNTER — Other Ambulatory Visit: Payer: Self-pay | Admitting: Cardiovascular Disease

## 2016-03-19 ENCOUNTER — Other Ambulatory Visit: Payer: Self-pay | Admitting: Cardiovascular Disease

## 2016-04-03 ENCOUNTER — Other Ambulatory Visit: Payer: Self-pay | Admitting: Internal Medicine

## 2016-04-13 ENCOUNTER — Telehealth: Payer: Self-pay | Admitting: Neurology

## 2016-04-13 NOTE — Telephone Encounter (Signed)
Per Dr. Lucia GaskinsAhern, this patient is no longer on Dysport.  I have returned the Ispen and they will discharge the patient.

## 2016-04-13 NOTE — Telephone Encounter (Signed)
Tinisha/Ipsen [phone] wants to know if pt is still taking dysport. If not can she discharge her. Please call

## 2016-05-02 ENCOUNTER — Other Ambulatory Visit: Payer: Self-pay | Admitting: Internal Medicine

## 2016-05-02 DIAGNOSIS — Z1231 Encounter for screening mammogram for malignant neoplasm of breast: Secondary | ICD-10-CM

## 2016-05-03 ENCOUNTER — Encounter: Payer: Self-pay | Admitting: Cardiovascular Disease

## 2016-05-03 ENCOUNTER — Ambulatory Visit (INDEPENDENT_AMBULATORY_CARE_PROVIDER_SITE_OTHER): Payer: Medicare Other | Admitting: Cardiovascular Disease

## 2016-05-03 VITALS — BP 114/78 | HR 60 | Ht 66.0 in | Wt 206.5 lb

## 2016-05-03 DIAGNOSIS — R600 Localized edema: Secondary | ICD-10-CM

## 2016-05-03 DIAGNOSIS — R5382 Chronic fatigue, unspecified: Secondary | ICD-10-CM | POA: Diagnosis not present

## 2016-05-03 DIAGNOSIS — R635 Abnormal weight gain: Secondary | ICD-10-CM

## 2016-05-03 DIAGNOSIS — M7989 Other specified soft tissue disorders: Secondary | ICD-10-CM

## 2016-05-03 DIAGNOSIS — R0600 Dyspnea, unspecified: Secondary | ICD-10-CM

## 2016-05-03 DIAGNOSIS — I480 Paroxysmal atrial fibrillation: Secondary | ICD-10-CM

## 2016-05-03 DIAGNOSIS — I5033 Acute on chronic diastolic (congestive) heart failure: Secondary | ICD-10-CM | POA: Insufficient documentation

## 2016-05-03 MED ORDER — POTASSIUM CHLORIDE CRYS ER 20 MEQ PO TBCR: 20.0000 meq | EXTENDED_RELEASE_TABLET | Freq: Two times a day (BID) | ORAL | 6 refills | Status: DC | PRN

## 2016-05-03 MED ORDER — FUROSEMIDE 20 MG PO TABS: 20.0000 mg | ORAL_TABLET | ORAL | 3 refills | Status: DC | PRN

## 2016-05-03 MED ORDER — HYDROCHLOROTHIAZIDE 25 MG PO TABS: 25.0000 mg | ORAL_TABLET | Freq: Every day | ORAL | 6 refills | Status: DC | PRN

## 2016-05-03 NOTE — Patient Instructions (Addendum)
Medication Instructions:   Please restart lasix twice a day Take HCTZ 30 minutes before the morning lasix For 3 to 4 days potassium twice a day  Then down to HCTZ and lasix daily Potassium one a day  Watch your weight daily  Goal weight low 190 pounds  Labwork:  No new labs needed  Testing/Procedures:  No further testing at this time   I recommend watching educational videos on topics of interest to you at:       www.goemmi.com  Enter code: [email or web address]    Follow-Up: It was a pleasure seeing you in the office today. Please call us if you have new issues that need to be addressed before your next appt.  [phone]  Your physician wants you to follow-up in: 1 month.    If you need a refill on your cardiac medications before your next appointment, please call your pharmacy.
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
## 2016-05-06 ENCOUNTER — Telehealth: Payer: Self-pay | Admitting: Cardiovascular Disease

## 2016-05-06 NOTE — Telephone Encounter (Signed)
Pt is calling with weight recordings: Starting weight is 206 14th- 202 This morning 203 States she has taken her Lasix and HCTZ

## 2016-05-06 NOTE — Telephone Encounter (Signed)
Patient calling in to report her weights and she is up to 206. She was previously 203 and is taking her medications as directed. Instructed her to take an extra furosemide just one time today and see if that helps and to continue monitoring her weights. She verbalized understanding and had no further questions at this time.

## 2016-05-06 NOTE — Addendum Note (Signed)
Addended by: Festus AloeRESPO, Intisar Claudio G on: 05/06/2016 01:44 PM   Modules accepted: Orders

## 2016-05-09 ENCOUNTER — Ambulatory Visit
Admission: RE | Admit: 2016-05-09 | Discharge: 2016-05-09 | Disposition: A | Discharge status: Home / Self Care | Payer: Medicare Other | Source: Ambulatory Visit | Attending: Internal Medicine | Admitting: Internal Medicine

## 2016-05-09 ENCOUNTER — Encounter (HOSPITAL_COMMUNITY): Payer: Self-pay

## 2016-05-09 DIAGNOSIS — Z1231 Encounter for screening mammogram for malignant neoplasm of breast: Secondary | ICD-10-CM | POA: Diagnosis present

## 2016-05-12 ENCOUNTER — Inpatient Hospital Stay
Admission: RE | Admit: 2016-05-12 | Discharge: 2016-05-12 | Disposition: A | Discharge status: Home / Self Care | Payer: Self-pay | Source: Ambulatory Visit | Attending: *Deleted | Admitting: *Deleted

## 2016-05-12 ENCOUNTER — Other Ambulatory Visit: Payer: Self-pay | Admitting: *Deleted

## 2016-05-12 DIAGNOSIS — Z9289 Personal history of other medical treatment: Secondary | ICD-10-CM

## 2016-05-13 ENCOUNTER — Telehealth: Payer: Self-pay | Admitting: Cardiovascular Disease

## 2016-05-13 NOTE — Telephone Encounter (Signed)
S/w patient. Gave her the recommendations. She said she would continue wearing her compression stockings. She did take the Lasix 40mg  as we discussed a little earlier. She will call next week if she does not see anymore improvement. She will seek emergency medical attention if her symptoms worsen severely.

## 2016-05-13 NOTE — Telephone Encounter (Signed)
Pt is calling with weight records. 12/17-200 pounds 12/18-200 12/19-202 12/20-201 12/21-202 12/22-201

## 2016-05-13 NOTE — Telephone Encounter (Signed)
Agree with additional dose of lasix.  Suspect some of her swelling is venous insufficiency given dependent nature (gone in am, worse @ end of day).  She'd be well served by keeping legs elevated during the day when sitting and/or wearing compression hose/TEDS.

## 2016-05-13 NOTE — Telephone Encounter (Signed)
S/w patient. She said her weight is still not at goal.  It was 201 lb today. Has some SOB with exertion, has swelling in hands and legs. She stated her legs get better through the night then by the afternoon they're swollen again. Denies CP or dizziness. She has been avoiding foods high in salt and limiting fluid intake. She says she urinating adequately. She's taking Lasix 20 mg BID, HCTZ 25 mg daily and potassium 20 BID. She saw Dr Mariah MillingGollan on 12/12 OV. In his note he said patient goal weight is low 190's. Patient is calling because she still has not lost down to that weight.  Advised patient to go ahead and take an extra 20mg  lasix. She stated is was time for her second dose of Lasix anyways so she will take 40mg  lasix now.  Routing to Ward Givenshris Berge, NP for advice.

## 2016-05-18 ENCOUNTER — Telehealth: Payer: Self-pay | Admitting: Cardiovascular Disease

## 2016-05-18 MED ORDER — FUROSEMIDE 20 MG PO TABS: 40.0000 mg | ORAL_TABLET | Freq: Two times a day (BID) | ORAL | 3 refills | Status: DC

## 2016-05-18 NOTE — Telephone Encounter (Signed)
History of noncompliance with her Lasix Would confirm she was taking Lasix 20 mg twice a day  Options include increasing Lasix up to 40 mg twice a day Could try torsemide 20 mg twice a day until swelling improves then 20 mg daily Would stay on her potassium twice a day when she takes diuretic twice a day Will need close follow-up in clinic in the next several weeks

## 2016-05-18 NOTE — Telephone Encounter (Signed)
Pt calling just to give in her weights  Today It is 204  Yesterday 202 05/14/16  199  Swelling all over mostly in legs Is using the support hose  Please advise on to help with the weight gain

## 2016-05-18 NOTE — Telephone Encounter (Signed)
Spoke w/ pt.  Advised her of Dr. Windell HummingbirdGollan's recommendation.  She confirms that she is taking lasix 20 mg BID.   She would like to increase this to 40 mg BID for the rest of the week and see if her sx improve. If not, we can send in torsemide. Asked her to call me next week and let me know how she's doing.

## 2016-05-18 NOTE — Telephone Encounter (Signed)
Spoke w/ pt.  She reports that her wt is continuing to stay up. She tried to adhere to a low sodium diet, but had country ham and red eye gravy on Christmas Day. Since then she has had PB&J sandwich and salad w/ [employer] dressing. Discussed w/ her the sodium content of foods. She has tried to decrease her fluid intake, no longer "chugs" water at the sink, will drink enough to keep her mouth moist. She does keep her feet elevated when sitting, as advised when she called last week w/ same sx (phone note 12/22). She is currently taking lasix 20 mg BID, was advised to take HCTZ prn, but this makes her very sleepy and she will not take it anymore. She states that it was discussed during her ov that she may need to switch to torsemide if lasix was not effective. She would like to know if she can go ahead and make this change, as her wt does not seem to be going in the right direction.

## 2016-05-24 ENCOUNTER — Telehealth: Payer: Self-pay | Admitting: Cardiovascular Disease

## 2016-05-24 MED ORDER — TORSEMIDE 20 MG PO TABS: 20.0000 mg | ORAL_TABLET | Freq: Every day | ORAL | 6 refills | Status: DC

## 2016-05-24 NOTE — Telephone Encounter (Signed)
Spoke w/ [name].  She reports that her wt is continuing to go up, despite increase lasix dose of 40 mg BID. She is adamant that she is following low sodium diet, limiting her fluids, and taking her meds as prescribed. She would like to switch from lasix to torsemide, as previously discussed.  [name] keeps her feet elevated as much as she can and wears tight knee high socks 5-6 hrs per day.  She states that she is thirsty and feels that her skin is dry, but her wt continues to increase. Advised [name] that I am sending in torsemide 20 mg, per Dr. Windell HummingbirdGollan's previous instructions, she is to take 20 mg BID until her swelling improves, then decrease to once daily dosing; stay on K+. She verbalizes understanding, has appt on 1/19, and will call back in a few days to let me know how she is doing.

## 2016-05-24 NOTE — Telephone Encounter (Signed)
Pt calling in her weights  It is 206 Now Dr Mariah MillingGollan stated to her last time that if it didn't go down we would need to adjust her medication And states in left leg is a bit more swollen than the right now (just a little she states)  Please advise

## 2016-05-24 NOTE — Telephone Encounter (Signed)
Patient picked up medication from pharmacy and wants to confirm dosage instructions as she thinks she may have mis understood.  Please call.

## 2016-05-30 ENCOUNTER — Telehealth: Payer: Self-pay | Admitting: Cardiovascular Disease

## 2016-05-30 ENCOUNTER — Encounter: Payer: Self-pay | Admitting: Cardiovascular Disease

## 2016-05-30 ENCOUNTER — Ambulatory Visit (INDEPENDENT_AMBULATORY_CARE_PROVIDER_SITE_OTHER): Payer: Medicare Other | Admitting: Cardiovascular Disease

## 2016-05-30 VITALS — BP 118/70 | HR 57 | Ht 66.0 in | Wt 207.8 lb

## 2016-05-30 DIAGNOSIS — M7989 Other specified soft tissue disorders: Secondary | ICD-10-CM | POA: Diagnosis not present

## 2016-05-30 DIAGNOSIS — I480 Paroxysmal atrial fibrillation: Secondary | ICD-10-CM | POA: Diagnosis not present

## 2016-05-30 DIAGNOSIS — I5033 Acute on chronic diastolic (congestive) heart failure: Secondary | ICD-10-CM

## 2016-05-30 DIAGNOSIS — R0602 Shortness of breath: Secondary | ICD-10-CM

## 2016-05-30 MED ORDER — TORSEMIDE 20 MG PO TABS: 40.0000 mg | ORAL_TABLET | Freq: Two times a day (BID) | ORAL | 6 refills | Status: DC

## 2016-05-30 NOTE — Patient Instructions (Addendum)
Medication Instructions:   Please increase the torsemide up to 2 in the AM and one in the PM  If weight does not start to improve, increase up to torsemide 2 in the am and PM  Labwork:  We will check BMP, BNP, and LFTs today  Testing/Procedures:  We will order an echocardiogram for SOB, leg swelling Your physician has requested that you have an echocardiogram. Echocardiography is a painless test that uses sound waves to create images of your heart. It provides your doctor with information about the size and shape of your heart and how well your heart's chambers and valves are working. This procedure takes approximately one hour. There are no restrictions for this procedure.  I recommend watching educational videos on topics of interest to you at:       [email or web address]  Enter code: [email or web address]    Follow-Up: It was a pleasure seeing you in the office today. Please call us if you have new issues that need to be addressed before your next appt.  [phone]  Your physician wants you to follow-up in: 1 month.    If you need a refill on your cardiac medications before your next appointment, please call your pharmacy.       Echocardiogram An echocardiogram, or echocardiography, uses sound waves (ultrasound) to produce an image of your heart. The echocardiogram is simple, painless, obtained within a short period of time, and offers valuable information to your health care provider. The images from an echocardiogram can provide information such as:  Evidence of coronary artery disease (CAD).  Heart size.  Heart muscle function.  Heart valve function.  Aneurysm detection.  Evidence of a past heart attack.  Fluid buildup around the heart.  Heart muscle thickening.  Assess heart valve function. Tell a health care provider about:  Any allergies you have.  All medicines you are taking, including vitamins, herbs, eye drops, creams, and over-the-counter medicines.  Any  problems you or family members have had with anesthetic medicines.  Any blood disorders you have.  Any surgeries you have had.  Any medical conditions you have.  Whether you are pregnant or may be pregnant. What happens before the procedure? No special preparation is needed. Eat and drink normally. What happens during the procedure?  In order to produce an image of your heart, gel will be applied to your chest and a wand-like tool (transducer) will be moved over your chest. The gel will help transmit the sound waves from the transducer. The sound waves will harmlessly bounce off your heart to allow the heart images to be captured in real-time motion. These images will then be recorded.  You may need an IV to receive a medicine that improves the quality of the pictures. What happens after the procedure? You may return to your normal schedule including diet, activities, and medicines, unless your health care provider tells you otherwise. This information is not intended to replace advice given to you by your health care provider. Make sure you discuss any questions you have with your health care provider. Document Released: 05/06/2000 Document Revised: 12/26/2015 Document Reviewed: 01/14/2013 Elsevier Interactive Patient Education  2017 [employer]

## 2016-05-30 NOTE — Telephone Encounter (Signed)
Pt added on to see Dr. Mariah MillingGollan today @ 10:40. She will call back to reschedule if she is not able to get a ride.

## 2016-05-30 NOTE — Telephone Encounter (Signed)
Pt calling stating her weight has gone up around 207 States it has been staying between 206-207 Wanted to know if we needed to up her medication  Please advise
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
## 2016-05-31 LAB — BASIC METABOLIC PANEL
BUN / CREAT RATIO: 19 (ref 12–28)
BUN: 22 mg/dL (ref 8–27)
CO2: 29 mmol/L (ref 18–29)
Calcium: 10 mg/dL (ref 8.7–10.3)
Chloride: 97 mmol/L (ref 96–106)
Creatinine, Ser: 1.18 mg/dL — ABNORMAL HIGH (ref 0.57–1.00)
GFR calc Af Amer: 53 mL/min/{1.73_m2} — ABNORMAL LOW (ref >59)
GFR calc non Af Amer: 46 mL/min/{1.73_m2} — ABNORMAL LOW (ref >59)
GLUCOSE: 118 mg/dL — AB (ref 65–99)
POTASSIUM: 4.1 mmol/L (ref 3.5–5.2)
SODIUM: 143 mmol/L (ref 134–144)

## 2016-05-31 LAB — HEPATIC FUNCTION PANEL
ALBUMIN: 3.8 g/dL (ref 3.5–4.8)
ALK PHOS: 81 IU/L (ref 39–117)
ALT: 86 IU/L — ABNORMAL HIGH (ref 0–32)
AST: 82 IU/L — AB (ref 0–40)
Bilirubin Total: 0.4 mg/dL (ref 0.0–1.2)
Bilirubin, Direct: 0.14 mg/dL (ref 0.00–0.40)
TOTAL PROTEIN: 6.6 g/dL (ref 6.0–8.5)

## 2016-05-31 LAB — PRO B NATRIURETIC PEPTIDE: NT-Pro BNP: 364 pg/mL — ABNORMAL HIGH (ref 0–301)

## 2016-06-03 ENCOUNTER — Ambulatory Visit: Payer: Medicare Other | Admitting: Cardiovascular Disease

## 2016-06-14 ENCOUNTER — Ambulatory Visit (INDEPENDENT_AMBULATORY_CARE_PROVIDER_SITE_OTHER): Payer: Medicare Other

## 2016-06-14 ENCOUNTER — Other Ambulatory Visit: Payer: Self-pay

## 2016-06-14 DIAGNOSIS — M7989 Other specified soft tissue disorders: Secondary | ICD-10-CM | POA: Diagnosis not present

## 2016-06-14 DIAGNOSIS — I480 Paroxysmal atrial fibrillation: Secondary | ICD-10-CM | POA: Diagnosis not present

## 2016-06-14 DIAGNOSIS — R0602 Shortness of breath: Secondary | ICD-10-CM

## 2016-06-14 DIAGNOSIS — I5033 Acute on chronic diastolic (congestive) heart failure: Secondary | ICD-10-CM

## 2016-06-20 ENCOUNTER — Other Ambulatory Visit: Payer: Self-pay | Admitting: Cardiovascular Disease

## 2016-06-30 ENCOUNTER — Encounter: Payer: Self-pay | Admitting: Cardiovascular Disease

## 2016-06-30 ENCOUNTER — Ambulatory Visit (INDEPENDENT_AMBULATORY_CARE_PROVIDER_SITE_OTHER): Payer: Medicare Other | Admitting: Cardiovascular Disease

## 2016-06-30 VITALS — BP 120/84 | HR 57 | Ht 66.0 in | Wt 203.8 lb

## 2016-06-30 DIAGNOSIS — I5033 Acute on chronic diastolic (congestive) heart failure: Secondary | ICD-10-CM

## 2016-06-30 DIAGNOSIS — R600 Localized edema: Secondary | ICD-10-CM

## 2016-06-30 DIAGNOSIS — I480 Paroxysmal atrial fibrillation: Secondary | ICD-10-CM | POA: Diagnosis not present

## 2016-06-30 DIAGNOSIS — R0600 Dyspnea, unspecified: Secondary | ICD-10-CM | POA: Diagnosis not present
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
## 2016-06-30 NOTE — Patient Instructions (Addendum)
Medication Instructions:   No medication changes made  Labwork:  We will check BMP today  Testing/Procedures:  No further testing at this time   I recommend watching educational videos on topics of interest to you at:       [email or web address]  Enter code: [email or web address]    Follow-Up: It was a pleasure seeing you in the office today. Please call us if you have new issues that need to be addressed before your next appt.  [phone]  Your physician wants you to follow-up in: 6 months.  You will receive a reminder letter in the mail two months in advance. If you don't receive a letter, please call our office to schedule the follow-up appointment.  If you need a refill on your cardiac medications before your next appointment, please call your pharmacy.     

## 2016-07-01 LAB — BASIC METABOLIC PANEL
BUN / CREAT RATIO: 20 (ref 12–28)
BUN: 24 mg/dL (ref 8–27)
CALCIUM: 9.9 mg/dL (ref 8.7–10.3)
CHLORIDE: 98 mmol/L (ref 96–106)
CO2: 26 mmol/L (ref 18–29)
Creatinine, Ser: 1.23 mg/dL — ABNORMAL HIGH (ref 0.57–1.00)
GFR calc non Af Amer: 43 mL/min/{1.73_m2} — ABNORMAL LOW (ref >59)
GFR, EST AFRICAN AMERICAN: 50 mL/min/{1.73_m2} — AB (ref >59)
Glucose: 90 mg/dL (ref 65–99)
POTASSIUM: 4.2 mmol/L (ref 3.5–5.2)
Sodium: 142 mmol/L (ref 134–144)

## 2016-07-05 ENCOUNTER — Other Ambulatory Visit: Payer: Self-pay | Admitting: Internal Medicine

## 2016-09-19 ENCOUNTER — Telehealth: Payer: Self-pay | Admitting: Cardiovascular Disease

## 2016-09-19 NOTE — Telephone Encounter (Signed)
Would consider decreasing metoprolol down to 25 mg twice a day Notes indicate she is taking 50 twice a day Continue the flecainide at the current dose Would monitor heart rate
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
## 2016-09-19 NOTE — Telephone Encounter (Signed)
Pt states her "fluid is going up", she states it is because of her diet. She does not remember how she is to increase her fluid pill. Please call and advise.

## 2016-09-20 NOTE — Telephone Encounter (Signed)
Attempted to contact pt. No answer, phone kept ringing w/ no vm picking up.

## 2016-09-20 NOTE — Telephone Encounter (Signed)
Spoke w/ pt.  Advised her of Dr. Windell Hummingbird recommendation.  She reports that she has lost 1 lb overnight and is feeling better. She will continue to monitor and call back if sx do not improve.

## 2016-09-26 ENCOUNTER — Other Ambulatory Visit: Payer: Self-pay | Admitting: Cardiovascular Disease

## 2016-11-20 ENCOUNTER — Other Ambulatory Visit: Payer: Self-pay | Admitting: Internal Medicine

## 2017-01-01 DIAGNOSIS — Z7189 Other specified counseling: Secondary | ICD-10-CM | POA: Insufficient documentation
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
## 2017-01-02 ENCOUNTER — Ambulatory Visit (INDEPENDENT_AMBULATORY_CARE_PROVIDER_SITE_OTHER): Payer: Medicare Other | Admitting: Cardiovascular Disease

## 2017-01-02 ENCOUNTER — Encounter: Payer: Self-pay | Admitting: Cardiovascular Disease

## 2017-01-02 VITALS — BP 110/60 | HR 60 | Ht 66.0 in | Wt 194.2 lb

## 2017-01-02 DIAGNOSIS — M7989 Other specified soft tissue disorders: Secondary | ICD-10-CM

## 2017-01-02 DIAGNOSIS — I5033 Acute on chronic diastolic (congestive) heart failure: Secondary | ICD-10-CM | POA: Diagnosis not present

## 2017-01-02 DIAGNOSIS — I480 Paroxysmal atrial fibrillation: Secondary | ICD-10-CM

## 2017-01-02 DIAGNOSIS — Z7189 Other specified counseling: Secondary | ICD-10-CM

## 2017-01-02 NOTE — Patient Instructions (Addendum)

## 2017-01-05 ENCOUNTER — Other Ambulatory Visit (INDEPENDENT_AMBULATORY_CARE_PROVIDER_SITE_OTHER): Payer: Medicare Other

## 2017-01-05 DIAGNOSIS — I480 Paroxysmal atrial fibrillation: Secondary | ICD-10-CM

## 2017-01-05 DIAGNOSIS — I5033 Acute on chronic diastolic (congestive) heart failure: Secondary | ICD-10-CM

## 2017-01-05 DIAGNOSIS — M7989 Other specified soft tissue disorders: Secondary | ICD-10-CM

## 2017-01-05 DIAGNOSIS — Z7189 Other specified counseling: Secondary | ICD-10-CM

## 2017-01-06 LAB — BASIC METABOLIC PANEL
BUN / CREAT RATIO: 21 (ref 12–28)
BUN: 20 mg/dL (ref 8–27)
CHLORIDE: 102 mmol/L (ref 96–106)
CO2: 24 mmol/L (ref 20–29)
CREATININE: 0.97 mg/dL (ref 0.57–1.00)
Calcium: 9.8 mg/dL (ref 8.7–10.3)
GFR calc Af Amer: 67 mL/min/{1.73_m2} (ref >59)
GFR calc non Af Amer: 58 mL/min/{1.73_m2} — ABNORMAL LOW (ref >59)
GLUCOSE: 102 mg/dL — AB (ref 65–99)
Potassium: 4.5 mmol/L (ref 3.5–5.2)
SODIUM: 141 mmol/L (ref 134–144)

## 2017-01-28 ENCOUNTER — Other Ambulatory Visit: Payer: Self-pay | Admitting: Cardiovascular Disease

## 2017-02-20 ENCOUNTER — Other Ambulatory Visit: Payer: Self-pay | Admitting: Internal Medicine

## 2017-02-26 ENCOUNTER — Other Ambulatory Visit: Payer: Self-pay | Admitting: Cardiovascular Disease

## 2017-03-24 ENCOUNTER — Other Ambulatory Visit: Payer: Self-pay | Admitting: Cardiovascular Disease

## 2017-04-28 ENCOUNTER — Other Ambulatory Visit: Payer: Self-pay | Admitting: Internal Medicine

## 2017-04-28 DIAGNOSIS — Z1231 Encounter for screening mammogram for malignant neoplasm of breast: Secondary | ICD-10-CM

## 2017-05-26 ENCOUNTER — Ambulatory Visit
Admission: RE | Admit: 2017-05-26 | Discharge: 2017-05-26 | Disposition: A | Discharge status: Home / Self Care | Payer: Medicare Other | Source: Ambulatory Visit | Attending: Internal Medicine | Admitting: Internal Medicine

## 2017-05-26 DIAGNOSIS — Z1231 Encounter for screening mammogram for malignant neoplasm of breast: Secondary | ICD-10-CM | POA: Insufficient documentation

## 2017-06-02 ENCOUNTER — Other Ambulatory Visit: Payer: Self-pay | Admitting: Cardiovascular Disease

## 2017-06-02 NOTE — Telephone Encounter (Signed)
Please review for refill, Thanks !  

## 2017-07-03 ENCOUNTER — Other Ambulatory Visit: Payer: Self-pay | Admitting: Cardiovascular Disease

## 2017-07-03 ENCOUNTER — Other Ambulatory Visit: Payer: Self-pay

## 2017-07-03 DIAGNOSIS — Z7901 Long term (current) use of anticoagulants: Secondary | ICD-10-CM

## 2017-07-03 NOTE — Telephone Encounter (Signed)
Please review for refill, thanks ! 

## 2017-08-03 ENCOUNTER — Other Ambulatory Visit: Payer: Self-pay | Admitting: Cardiovascular Disease

## 2017-08-03 NOTE — Telephone Encounter (Signed)
Please review for refill, Thanks !  

## 2017-09-27 ENCOUNTER — Other Ambulatory Visit: Payer: Self-pay | Admitting: Cardiovascular Disease

## 2017-09-27 NOTE — Telephone Encounter (Signed)
Please review for refill, Thanks !  

## 2017-09-28 ENCOUNTER — Ambulatory Visit
Admission: RE | Admit: 2017-09-28 | Discharge: 2017-09-28 | Disposition: A | Discharge status: Home / Self Care | Payer: Medicare Other | Source: Ambulatory Visit | Attending: Podiatry | Admitting: Podiatry

## 2017-09-28 ENCOUNTER — Other Ambulatory Visit: Payer: Self-pay | Admitting: Podiatry

## 2017-09-28 DIAGNOSIS — M25572 Pain in left ankle and joints of left foot: Secondary | ICD-10-CM

## 2017-09-28 DIAGNOSIS — L03116 Cellulitis of left lower limb: Secondary | ICD-10-CM

## 2017-09-28 DIAGNOSIS — R600 Localized edema: Secondary | ICD-10-CM | POA: Insufficient documentation

## 2017-10-26 ENCOUNTER — Other Ambulatory Visit: Payer: Self-pay | Admitting: Cardiovascular Disease

## 2017-10-26 NOTE — Telephone Encounter (Signed)
Please review for refill, Thanks !  

## 2017-11-17 ENCOUNTER — Other Ambulatory Visit: Payer: Self-pay | Admitting: Cardiovascular Disease

## 2018-01-02 ENCOUNTER — Ambulatory Visit (INDEPENDENT_AMBULATORY_CARE_PROVIDER_SITE_OTHER): Payer: Medicare Other | Admitting: Vascular Surgery

## 2018-01-02 ENCOUNTER — Encounter (INDEPENDENT_AMBULATORY_CARE_PROVIDER_SITE_OTHER): Payer: Self-pay | Admitting: Vascular Surgery

## 2018-01-02 VITALS — BP 142/66 | HR 62 | Resp 14 | Ht 66.0 in | Wt 199.0 lb

## 2018-01-02 DIAGNOSIS — I10 Essential (primary) hypertension: Secondary | ICD-10-CM | POA: Insufficient documentation

## 2018-01-02 DIAGNOSIS — I480 Paroxysmal atrial fibrillation: Secondary | ICD-10-CM

## 2018-01-02 DIAGNOSIS — I5033 Acute on chronic diastolic (congestive) heart failure: Secondary | ICD-10-CM

## 2018-01-02 DIAGNOSIS — M7989 Other specified soft tissue disorders: Secondary | ICD-10-CM

## 2018-01-02 NOTE — Progress Notes (Signed)
Patient ID: [name], female   DOB: 1940/12/16, 77 y.o.   MRN: [id number]  Chief Complaint  Patient presents with  . New Patient (Initial Visit)    left leg edema    HPI Ann Fellersnn B Adcox is a 77 y.o. female.  I am asked to see the patient by Dr. Hyacinth MeekerMiller and his PA for evaluation of leg swelling.  The patient reports years of lower extremity edema.  This seemed to start after her development of atrial fibrillation.  Since that time, she has had several episodes of what sounds like cellulitis of the left leg.  The left leg is the more severely affected of the 2 legs.  She had a negative DVT study earlier this year, but no functional assessment of the veins.  She reports the legs to be very tender to the touch and very itchy.  The pain is described as an aching and heaviness in the leg as well as the itching.  She did have to have an [place]na boot several weeks ago when the swelling flared.   Past Medical History:  Diagnosis Date  . A-fib (HCC) 2012, 2014  . Anxiety   . Arrhythmia    A-fib  . Bursitis    hips  . Concussion   . Concussion July, 28, 2014  . GERD (gastroesophageal reflux disease)   . Hypertension   . Palpitations   . Pneumonia 2012    Past Surgical History:  Procedure Laterality Date  . ABDOMINAL SURGERY  2008   abdominal muscle mesh insert  . Arm surgery  2015   Pin implanted   . CATARACT EXTRACTION Right August 30, 2013  . CATARACT EXTRACTION Left August 09, 2013  . COLONOSCOPY    . ESOPHAGOGASTRODUODENOSCOPY    . HERNIA REPAIR Left 1970  . HERNIA REPAIR  2015   Dr. Malissa HippoW. Smith  . KNEE SURGERY Right 1980  . NOSE SURGERY    . SHOULDER SURGERY Right 2000  . TOTAL ABDOMINAL HYSTERECTOMY  1995    Family History  Adopted: Yes  Problem Relation Age of Onset  . Dementia Neg Hx        ADOPTED    Social History Social History   Tobacco Use  . Smoking status: Never Smoker  . Smokeless tobacco: Former Engineer, waterUser  Substance Use Topics  . Alcohol use: No  . Drug use:  No     Allergies  Allergen Reactions  . Albuterol   . Iodine Hives  . Minocycline Other (See Comments)    Onset 04/10/2001.  Marland Kitchen. Norvasc [Amlodipine Besylate] Hives  . Penicillins Hives  . Propoxyphene Hives    Onset 04/10/2001.  . Vicodin [Hydrocodone-Acetaminophen] Hives    With prolonged use   . Amlodipine Rash  . Betadine [Povidone Iodine] Hives and Rash  . Clarithromycin Rash    Current Outpatient Medications  Medication Sig Dispense Refill  . albuterol (VENTOLIN HFA) 108 (90 Base) MCG/ACT inhaler Inhale 1-2 puffs into the lungs every 6 (six) hours as needed for wheezing or shortness of breath.    . ALPRAZolam (XANAX) 0.25 MG tablet 0.25 mg. Take 1/2 to 1 tablet as needed     . Ascorbic Acid (VITAMIN C) 1000 MG tablet Take 1,000 mg by mouth daily.    Marland Kitchen. azelastine (ASTELIN) 137 MCG/SPRAY nasal spray Place 2 sprays into the nose 2 (two) times daily. Use in each nostril as directed    . beta carotene w/minerals (OCUVITE) tablet Take 2 tablets by  mouth daily.     . Calcium Carbonate-Vitamin D (CALCIUM + D PO) 600 mg. Take two tablets daily    . carbamazepine (TEGRETOL) 100 MG chewable tablet Chew 100 mg by mouth 2 (two) times daily as needed.    Marland Kitchen. ELIQUIS 5 MG TABS tablet TAKE 1 TABLET BY MOUTH TWICE DAILY 60 tablet 6  . esomeprazole (NEXIUM) 40 MG capsule Take 40 mg by mouth daily before breakfast.    . estradiol (ESTRACE) 1 MG tablet Take 1 mg by mouth daily.    . flecainide (TAMBOCOR) 100 MG tablet TAKE 1 TABLET(100 MG) BY MOUTH TWICE DAILY 180 tablet 0  . FLUoxetine (PROZAC) 20 MG capsule Take 20 mg by mouth daily.     . fluticasone (FLONASE) 50 MCG/ACT nasal spray 2 sprays by Nasal route daily.      . magnesium gluconate (MAGONATE) 500 MG tablet Take 1,000 mg by mouth daily.     . metoprolol (LOPRESSOR) 50 MG tablet TAKE 1 TABLET(50 MG) BY MOUTH TWICE DAILY (Patient taking differently: TAKE 1/2TABLET(25 MG) BY MOUTH TWICE DAILY) 180 tablet 3  . metoprolol tartrate  (LOPRESSOR) 50 MG tablet TAKE 1 TABLET(50 MG) BY MOUTH TWICE DAILY 180 tablet 3  . montelukast (SINGULAIR) 10 MG tablet TAKE 1 TABLET(10 MG) BY MOUTH AT BEDTIME 90 tablet 0  . oxybutynin (DITROPAN) 5 MG tablet TAKE 1 TABLET BY MOUTH TWICE DAILY    . potassium chloride SA (K-DUR,KLOR-CON) 20 MEQ tablet Take 1 tablet (20 mEq total) by mouth 2 (two) times daily as needed. 60 tablet 6  . pramipexole (MIRAPEX) 0.25 MG tablet Take 0.5 mg by mouth 4 (four) times daily.    . ranitidine (ZANTAC) 150 MG capsule Take 150 mg by mouth every evening.    . SUMAtriptan (IMITREX) 50 MG tablet Take 50 mg by mouth every 2 (two) hours as needed.      Marland Kitchen. tiZANidine (ZANAFLEX) 4 MG tablet Take 4 mg by mouth every 6 (six) hours as needed for muscle spasms.    Marland Kitchen. torsemide (DEMADEX) 20 MG tablet TAKE 2 TABLETS(40 MG) BY MOUTH TWICE DAILY 360 tablet 3  . traMADol (ULTRAM) 50 MG tablet Take by mouth every 6 (six) hours as needed.    . traZODone (DESYREL) 50 MG tablet Take 50 mg by mouth at bedtime.     . Triamcinolone Acetonide (TRIAMCINOLONE 0.1 % CREAM : EUCERIN) CREA Apply 1 application topically 2 (two) times daily.    . vitamin B-12 (CYANOCOBALAMIN) 1000 MCG tablet Take 1,000 mcg by mouth daily.    Marland Kitchen. Fesoterodine Fumarate (TOVIAZ PO) Take 4 mg by mouth 1 day or 1 dose.      No current facility-administered medications for this visit.       REVIEW OF SYSTEMS (Negative unless checked)  Constitutional: [] Weight loss  [] Fever  [] Chills Cardiac: [] Chest pain   [] Chest pressure   [x] Palpitations   [] Shortness of breath when laying flat   [] Shortness of breath at rest   [] Shortness of breath with exertion. Vascular:  [] Pain in legs with walking   [] Pain in legs at rest   [] Pain in legs when laying flat   [] Claudication   [] Pain in feet when walking  [] Pain in feet at rest  [] Pain in feet when laying flat   [] History of DVT   [] Phlebitis   [x] Swelling in legs   [] Varicose veins   [] Non-healing ulcers Pulmonary:   [] Uses  home oxygen   [] Productive cough   [] Hemoptysis   []   Wheeze  [] COPD   [] Asthma Neurologic:  [] Dizziness  [] Blackouts   [] Seizures   [] History of stroke   [] History of TIA  [] Aphasia   [] Temporary blindness   [] Dysphagia   [] Weakness or numbness in arms   [] Weakness or numbness in legs Musculoskeletal:  [x] Arthritis   [] Joint swelling   [x] Joint pain   [] Low back pain Hematologic:  [] Easy bruising  [] Easy bleeding   [] Hypercoagulable state   [] Anemic  [] Hepatitis Gastrointestinal:  [] Blood in stool   [] Vomiting blood  [x] Gastroesophageal reflux/heartburn   [] Abdominal pain Genitourinary:  [] Chronic kidney disease   [] Difficult urination  [] Frequent urination  [] Burning with urination   [] Hematuria Skin:  [] Rashes   [] Ulcers   [] Wounds Psychological:  [] History of anxiety   []  History of major depression.    Physical Exam BP (!) 142/66   Pulse 62   Resp 14   Ht 5\' 6"  (1.676 m)   Wt 199 lb (90.3 kg)   BMI 32.12 kg/m  Gen:  WD/WN, NAD.  Appears younger than stated age Head: [place]/AT, No temporalis wasting. Ear/Nose/Throat: Hearing grossly intact, nares w/o erythema or drainage, oropharynx w/o Erythema/Exudate Eyes: Conjunctiva clear, sclera non-icteric  Neck: trachea midline.  No JVD.  Pulmonary:  Good air movement, respirations not labored, no use of accessory muscles Cardiac: Irregularly irregular Vascular:  Vessel Right Left  Radial Palpable Palpable                          PT  trace palpable  not palpable  DP  1+ palpable  1+ palpable   Gastrointestinal: soft, non-tender/non-distended. Musculoskeletal: M/S 5/5 throughout.  Extremities without ischemic changes.  No deformity or atrophy. 1-2+ RLE edema, 2+ LLE edema.  Moderate stasis dermatitis changes are present in the left leg Neurologic: Sensation grossly intact in extremities.  Symmetrical.  Speech is fluent. Motor exam as listed above. Psychiatric: Judgment intact, Mood & affect appropriate for pt's clinical  situation. Dermatologic: No rashes or ulcers noted.  No cellulitis or open wounds.    Radiology No results found.  Labs No results found for this or any previous visit (from the past 2160 hour(s)).  Assessment/Plan:  Atrial fibrillation On anticoagulation.  Cardiac dysfunction could be contributing to lower extremity swelling.  Acute on chronic diastolic CHF (congestive heart failure) (HCC) Cardiac dysfunction could be contributing to lower extremity swelling.  Hypertension blood pressure control important in reducing the progression of atherosclerotic disease. On appropriate oral medications.   Leg swelling I have had a long discussion with the patient regarding swelling and why it  causes symptoms.  Patient will begin wearing graduated compression stockings class 1 (20-30 mmHg) on a daily basis a prescription was given. The patient will  beginning wearing the stockings first thing in the morning and removing them in the evening. The patient is instructed specifically not to sleep in the stockings.   In addition, behavioral modification will be initiated.  This will include frequent elevation, use of over the counter pain medications and exercise such as walking.  I have reviewed systemic causes for chronic edema such as liver, kidney and cardiac etiologies.  The patient denies problems with these organ systems.    Consideration for a lymph pump will also be made based upon the effectiveness of conservative therapy.  This would help to improve the edema control and prevent sequela such as ulcers and infections   Patient should undergo duplex ultrasound of the venous system to  ensure that DVT or reflux is not present.  The patient will follow-up with me after the ultrasound.        Festus Barren 01/02/2018, 2:10 PM   This note was created with Dragon medical transcription system.  Any errors from dictation are unintentional.

## 2018-01-02 NOTE — Patient Instructions (Signed)

## 2018-01-02 NOTE — Assessment & Plan Note (Signed)
blood pressure control important in reducing the progression of atherosclerotic disease. On appropriate oral medications.  

## 2018-01-02 NOTE — Assessment & Plan Note (Signed)

## 2018-01-02 NOTE — Assessment & Plan Note (Signed)
On anticoagulation.  Cardiac dysfunction could be contributing to lower extremity swelling.

## 2018-01-02 NOTE — Assessment & Plan Note (Signed)
Cardiac dysfunction could be contributing to lower extremity swelling.

## 2018-01-09 ENCOUNTER — Ambulatory Visit (INDEPENDENT_AMBULATORY_CARE_PROVIDER_SITE_OTHER): Payer: Medicare Other | Admitting: Vascular Surgery

## 2018-01-09 ENCOUNTER — Ambulatory Visit (INDEPENDENT_AMBULATORY_CARE_PROVIDER_SITE_OTHER): Payer: Medicare Other

## 2018-01-09 ENCOUNTER — Encounter (INDEPENDENT_AMBULATORY_CARE_PROVIDER_SITE_OTHER): Payer: Self-pay | Admitting: Vascular Surgery

## 2018-01-09 VITALS — BP 192/74 | HR 60 | Resp 16 | Ht 66.0 in | Wt 198.6 lb

## 2018-01-09 DIAGNOSIS — M7989 Other specified soft tissue disorders: Secondary | ICD-10-CM

## 2018-01-09 DIAGNOSIS — I5033 Acute on chronic diastolic (congestive) heart failure: Secondary | ICD-10-CM

## 2018-01-09 DIAGNOSIS — I480 Paroxysmal atrial fibrillation: Secondary | ICD-10-CM

## 2018-01-09 DIAGNOSIS — I872 Venous insufficiency (chronic) (peripheral): Secondary | ICD-10-CM | POA: Diagnosis not present

## 2018-01-09 DIAGNOSIS — R600 Localized edema: Secondary | ICD-10-CM | POA: Diagnosis not present

## 2018-01-09 DIAGNOSIS — I10 Essential (primary) hypertension: Secondary | ICD-10-CM | POA: Diagnosis not present

## 2018-01-09 DIAGNOSIS — I890 Lymphedema, not elsewhere classified: Secondary | ICD-10-CM | POA: Diagnosis not present

## 2018-01-09 NOTE — Assessment & Plan Note (Signed)
Her duplex today shows no DVT or superficial thrombophlebitis.  Only minimal venous reflux was seen in the right common femoral vein and no venous reflux was seen on the left.

## 2018-01-09 NOTE — Assessment & Plan Note (Signed)
The patient likely has chronic scarring the lymphatic channels from swelling and chronic inflammation.  She does not have significant venous disease requiring intervention.  Compression and elevation are being used, but she still has symptoms.  Adjuvant therapy in the form of a lymphedema pump would likely be an excellent adjuvant therapy to help improve her pain and swelling.  We will try to obtain that today.  I will plan to reassess her in 6 to 8 weeks in follow-up.

## 2018-01-09 NOTE — Patient Instructions (Signed)

## 2018-01-09 NOTE — Progress Notes (Signed)
MRN : [id number]  [name] is a 77 y.o. ([birth year]) female who presents with chief complaint of  Chief Complaint  Patient presents with  . Follow-up    bil reflux results  .  History of Present Illness: Patient returns today in follow up of leg swelling and pain.  No changes since her visit last week.  Compression and elevation have not really improved her symptoms thus far.  Her duplex today shows no DVT or superficial thrombophlebitis.  Only minimal venous reflux was seen in the right common femoral vein and no venous reflux was seen on the left.  Past Medical History:  Diagnosis Date  . A-fib (HCC) 2012, 2014  . Anxiety   . Arrhythmia    A-fib  . Bursitis    hips  . Concussion   . Concussion July, 28, 2014  . GERD (gastroesophageal reflux disease)   . Hypertension   . Palpitations   . Pneumonia 2012         Past Surgical History:  Procedure Laterality Date  . ABDOMINAL SURGERY  2008   abdominal muscle mesh insert  . Arm surgery  2015   Pin implanted   . CATARACT EXTRACTION Right August 30, 2013  . CATARACT EXTRACTION Left August 09, 2013  . COLONOSCOPY    . ESOPHAGOGASTRODUODENOSCOPY    . HERNIA REPAIR Left 1970  . HERNIA REPAIR  2015   Dr. Malissa HippoW. Smith  . KNEE SURGERY Right 1980  . NOSE SURGERY    . SHOULDER SURGERY Right 2000  . TOTAL ABDOMINAL HYSTERECTOMY  1995         Family History  Adopted: Yes  Problem Relation Age of Onset  . Dementia Neg Hx        ADOPTED    Social History Social History       Tobacco Use  . Smoking status: Never Smoker  . Smokeless tobacco: Former Engineer, waterUser  Substance Use Topics  . Alcohol use: No  . Drug use: No     Allergies  Allergen Reactions  . Albuterol   . Iodine Hives  . Minocycline Other (See Comments)    Onset 04/10/2001.  Marland Kitchen. Norvasc [Amlodipine Besylate] Hives  . Penicillins Hives  . Propoxyphene Hives    Onset 04/10/2001.  . Vicodin [Hydrocodone-Acetaminophen]  Hives    With prolonged use   . Amlodipine Rash  . Betadine [Povidone Iodine] Hives and Rash  . Clarithromycin Rash          Current Outpatient Medications  Medication Sig Dispense Refill  . albuterol (VENTOLIN HFA) 108 (90 Base) MCG/ACT inhaler Inhale 1-2 puffs into the lungs every 6 (six) hours as needed for wheezing or shortness of breath.    . ALPRAZolam (XANAX) 0.25 MG tablet 0.25 mg. Take 1/2 to 1 tablet as needed     . Ascorbic Acid (VITAMIN C) 1000 MG tablet Take 1,000 mg by mouth daily.    Marland Kitchen. azelastine (ASTELIN) 137 MCG/SPRAY nasal spray Place 2 sprays into the nose 2 (two) times daily. Use in each nostril as directed    . beta carotene w/minerals (OCUVITE) tablet Take 2 tablets by mouth daily.     . Calcium Carbonate-Vitamin D (CALCIUM + D PO) 600 mg. Take two tablets daily    . carbamazepine (TEGRETOL) 100 MG chewable tablet Chew 100 mg by mouth 2 (two) times daily as needed.    Marland Kitchen. ELIQUIS 5 MG TABS tablet TAKE 1 TABLET BY MOUTH TWICE DAILY 60 tablet  6  . esomeprazole (NEXIUM) 40 MG capsule Take 40 mg by mouth daily before breakfast.    . estradiol (ESTRACE) 1 MG tablet Take 1 mg by mouth daily.    . flecainide (TAMBOCOR) 100 MG tablet TAKE 1 TABLET(100 MG) BY MOUTH TWICE DAILY 180 tablet 0  . FLUoxetine (PROZAC) 20 MG capsule Take 20 mg by mouth daily.     . fluticasone (FLONASE) 50 MCG/ACT nasal spray 2 sprays by Nasal route daily.      . magnesium gluconate (MAGONATE) 500 MG tablet Take 1,000 mg by mouth daily.     . metoprolol (LOPRESSOR) 50 MG tablet TAKE 1 TABLET(50 MG) BY MOUTH TWICE DAILY (Patient taking differently: TAKE 1/2TABLET(25 MG) BY MOUTH TWICE DAILY) 180 tablet 3  . metoprolol tartrate (LOPRESSOR) 50 MG tablet TAKE 1 TABLET(50 MG) BY MOUTH TWICE DAILY 180 tablet 3  . montelukast (SINGULAIR) 10 MG tablet TAKE 1 TABLET(10 MG) BY MOUTH AT BEDTIME 90 tablet 0  . oxybutynin (DITROPAN) 5 MG tablet TAKE 1 TABLET BY MOUTH TWICE DAILY      . potassium chloride SA (K-DUR,KLOR-CON) 20 MEQ tablet Take 1 tablet (20 mEq total) by mouth 2 (two) times daily as needed. 60 tablet 6  . pramipexole (MIRAPEX) 0.25 MG tablet Take 0.5 mg by mouth 4 (four) times daily.    . ranitidine (ZANTAC) 150 MG capsule Take 150 mg by mouth every evening.    . SUMAtriptan (IMITREX) 50 MG tablet Take 50 mg by mouth every 2 (two) hours as needed.      Marland Kitchen tiZANidine (ZANAFLEX) 4 MG tablet Take 4 mg by mouth every 6 (six) hours as needed for muscle spasms.    Marland Kitchen torsemide (DEMADEX) 20 MG tablet TAKE 2 TABLETS(40 MG) BY MOUTH TWICE DAILY 360 tablet 3  . traMADol (ULTRAM) 50 MG tablet Take by mouth every 6 (six) hours as needed.    . traZODone (DESYREL) 50 MG tablet Take 50 mg by mouth at bedtime.     . Triamcinolone Acetonide (TRIAMCINOLONE 0.1 % CREAM : EUCERIN) CREA Apply 1 application topically 2 (two) times daily.    . vitamin B-12 (CYANOCOBALAMIN) 1000 MCG tablet Take 1,000 mcg by mouth daily.    Marland Kitchen Fesoterodine Fumarate (TOVIAZ PO) Take 4 mg by mouth 1 day or 1 dose.      No current facility-administered medications for this visit.       REVIEW OF SYSTEMS (Negative unless checked)  Constitutional: [] Weight loss  [] Fever  [] Chills Cardiac: [] Chest pain   [] Chest pressure   [x] Palpitations   [] Shortness of breath when laying flat   [] Shortness of breath at rest   [] Shortness of breath with exertion. Vascular:  [] Pain in legs with walking   [] Pain in legs at rest   [] Pain in legs when laying flat   [] Claudication   [] Pain in feet when walking  [] Pain in feet at rest  [] Pain in feet when laying flat   [] History of DVT   [] Phlebitis   [x] Swelling in legs   [] Varicose veins   [] Non-healing ulcers Pulmonary:   [] Uses home oxygen   [] Productive cough   [] Hemoptysis   [] Wheeze  [] COPD   [] Asthma Neurologic:  [] Dizziness  [] Blackouts   [] Seizures   [] History of stroke   [] History of TIA  [] Aphasia   [] Temporary blindness   [] Dysphagia    [] Weakness or numbness in arms   [] Weakness or numbness in legs Musculoskeletal:  [x] Arthritis   [] Joint swelling   [x] Joint pain   []   Low back pain Hematologic:  [] Easy bruising  [] Easy bleeding   [] Hypercoagulable state   [] Anemic  [] Hepatitis Gastrointestinal:  [] Blood in stool   [] Vomiting blood  [x] Gastroesophageal reflux/heartburn   [] Abdominal pain Genitourinary:  [] Chronic kidney disease   [] Difficult urination  [] Frequent urination  [] Burning with urination   [] Hematuria Skin:  [] Rashes   [] Ulcers   [] Wounds Psychological:  [] History of anxiety   []  History of major depression.    Physical Examination  BP (!) 192/74 (BP Location: Right Arm)   Pulse 60   Resp 16   Ht 5\' 6"  (1.676 m)   Wt 198 lb 9.6 oz (90.1 kg)   BMI 32.05 kg/m  Gen:  WD/WN, NAD Head: [place]/AT, No temporalis wasting. Ear/Nose/Throat: Hearing grossly intact, nares w/o erythema or drainage Eyes: Conjunctiva clear. Sclera non-icteric Neck: Supple.  Trachea midline Pulmonary:  Good air movement, no use of accessory muscles.  Cardiac: Irregular Vascular:  Vessel Right Left  Radial Palpable Palpable                          PT 1+ Palpable Not Palpable  DP 1+ Palpable 1+ Palpable    Musculoskeletal: M/S 5/5 throughout.  No deformity or atrophy. Moderate left statis changes. 1-2+ BLE edema. Neurologic: Sensation grossly intact in extremities.  Symmetrical.  Speech is fluent.  Psychiatric: Judgment intact, Mood & affect appropriate for pt's clinical situation. Dermatologic: No rashes or ulcers noted.  No cellulitis or open wounds.       Labs No results found for this or any previous visit (from the past 2160 hour(s)).  Radiology No results found.  Assessment/Plan Atrial fibrillation On anticoagulation.  Cardiac dysfunction could be contributing to lower extremity swelling.  Acute on chronic diastolic CHF (congestive heart failure) (HCC) Cardiac dysfunction could be contributing to lower  extremity swelling.  Hypertension blood pressure control important in reducing the progression of atherosclerotic disease. On appropriate oral medications.  Leg swelling Her duplex today shows no DVT or superficial thrombophlebitis.  Only minimal venous reflux was seen in the right common femoral vein and no venous reflux was seen on the left.   Lymphedema The patient likely has chronic scarring the lymphatic channels from swelling and chronic inflammation.  She does not have significant venous disease requiring intervention.  Compression and elevation are being used, but she still has symptoms.  Adjuvant therapy in the form of a lymphedema pump would likely be an excellent adjuvant therapy to help improve her pain and swelling.  We will try to obtain that today.  I will plan to reassess her in 6 to 8 weeks in follow-up.    Festus BarrenJason Dew, MD  01/09/2018 4:45 PM    This note was created with Dragon medical transcription system.  Any errors from dictation are purely unintentional

## 2018-02-11 ENCOUNTER — Other Ambulatory Visit: Payer: Self-pay | Admitting: Cardiovascular Disease

## 2018-03-07 ENCOUNTER — Other Ambulatory Visit: Payer: Self-pay | Admitting: *Deleted

## 2018-03-07 ENCOUNTER — Other Ambulatory Visit: Payer: Self-pay | Admitting: Cardiovascular Disease

## 2018-03-07 ENCOUNTER — Telehealth: Payer: Self-pay | Admitting: Cardiovascular Disease

## 2018-03-07 MED ORDER — FLECAINIDE ACETATE 100 MG PO TABS: ORAL_TABLET | ORAL | 0 refills | Status: DC

## 2018-03-07 NOTE — Telephone Encounter (Signed)
°*  STAT* If patient is at the pharmacy, call can be transferred to refill team.   1. Which medications need to be refilled? (please list name of each medication and dose if known)      Flecainide 100 mg po BID  2. Which pharmacy/location (including street and city if local pharmacy) is medication to be sent to? [facility] [address] [place]   3. Do they need a 30 day or 90 day supply?  90

## 2018-03-07 NOTE — Telephone Encounter (Signed)
Rx sent for Flecainide 100 mg tablet.  #60 No refills. Pt OD for f/u and must keep appointment for further refills.

## 2018-03-13 ENCOUNTER — Ambulatory Visit (INDEPENDENT_AMBULATORY_CARE_PROVIDER_SITE_OTHER): Payer: Medicare Other | Admitting: Vascular Surgery

## 2018-03-13 ENCOUNTER — Encounter (INDEPENDENT_AMBULATORY_CARE_PROVIDER_SITE_OTHER): Payer: Self-pay | Admitting: Vascular Surgery

## 2018-03-13 VITALS — BP 132/68 | HR 66 | Resp 16 | Ht 65.0 in | Wt 190.0 lb

## 2018-03-13 DIAGNOSIS — I10 Essential (primary) hypertension: Secondary | ICD-10-CM

## 2018-03-13 DIAGNOSIS — I480 Paroxysmal atrial fibrillation: Secondary | ICD-10-CM | POA: Diagnosis not present

## 2018-03-13 DIAGNOSIS — L03116 Cellulitis of left lower limb: Secondary | ICD-10-CM | POA: Insufficient documentation

## 2018-03-13 DIAGNOSIS — R600 Localized edema: Secondary | ICD-10-CM

## 2018-03-13 DIAGNOSIS — I890 Lymphedema, not elsewhere classified: Secondary | ICD-10-CM

## 2018-03-13 DIAGNOSIS — M7989 Other specified soft tissue disorders: Secondary | ICD-10-CM

## 2018-03-13 NOTE — Assessment & Plan Note (Signed)
Continue the lymphedema pump and compression.

## 2018-03-13 NOTE — Assessment & Plan Note (Signed)
Her leg has the appearance of her early cellulitis and I am going to start her on doxycycline today.  I would recommend continued compression and elevation.  We will see her back in a few weeks in follow-up to assess her response to antibiotics.

## 2018-03-13 NOTE — Progress Notes (Signed)
MRN : [id number]  [name] is a 77 y.o. ([birth year]) female who presents with chief complaint of  Chief Complaint  Patient presents with  . Follow-up    6 week follow up  .  History of Present Illness: Patient returns today in follow up of leg pain and swelling.  She has gotten the lymphedema pump and loves it.  It feels great and it does help her leg swelling.  Her symptoms and actually been a fair bit better until the last few days when she developed some redness and tenderness on the left medial lower leg and ankle area.  She has previously been treated for cellulitis and says it was just like that before.  He says it is not quite that bad yet.  No fever, chills, or signs of systemic infection.  Current Outpatient Medications  Medication Sig Dispense Refill  . albuterol (VENTOLIN HFA) 108 (90 Base) MCG/ACT inhaler Inhale 1-2 puffs into the lungs every 6 (six) hours as needed for wheezing or shortness of breath.    . ALPRAZolam (XANAX) 0.25 MG tablet 0.25 mg. Take 1/2 to 1 tablet as needed     . Ascorbic Acid (VITAMIN C) 1000 MG tablet Take 1,000 mg by mouth daily.    Marland Kitchen azelastine (ASTELIN) 137 MCG/SPRAY nasal spray Place 2 sprays into the nose 2 (two) times daily. Use in each nostril as directed    . beta carotene w/minerals (OCUVITE) tablet Take 2 tablets by mouth daily.     . Calcium Carbonate-Vitamin D (CALCIUM + D PO) 600 mg. Take two tablets daily    . carbamazepine (TEGRETOL) 100 MG chewable tablet Chew 100 mg by mouth 2 (two) times daily as needed.    . clotrimazole-betamethasone (LOTRISONE) cream Apply 1 application topically 2 (two) times daily.    . colchicine 0.6 MG tablet Take one pill by mouth once or twice per day during gout flare    . ELIQUIS 5 MG TABS tablet TAKE 1 TABLET BY MOUTH TWICE DAILY 60 tablet 6  . esomeprazole (NEXIUM) 40 MG capsule Take 40 mg by mouth daily before breakfast.    . estradiol (ESTRACE) 1 MG tablet Take 1 mg by mouth daily.    Marland Kitchen Fesoterodine  Fumarate (TOVIAZ PO) Take 4 mg by mouth 1 day or 1 dose.     Marland Kitchen FLUoxetine (PROZAC) 20 MG capsule Take 20 mg by mouth daily.     . fluticasone (FLONASE) 50 MCG/ACT nasal spray 2 sprays by Nasal route daily.      Marland Kitchen ketoconazole (NIZORAL) 2 % shampoo   0  . magnesium gluconate (MAGONATE) 500 MG tablet Take 1,000 mg by mouth daily.     . metoprolol tartrate (LOPRESSOR) 50 MG tablet TAKE 1 TABLET(50 MG) BY MOUTH TWICE DAILY 180 tablet 3  . montelukast (SINGULAIR) 10 MG tablet TAKE 1 TABLET(10 MG) BY MOUTH AT BEDTIME 90 tablet 0  . oxybutynin (DITROPAN) 5 MG tablet TAKE 1 TABLET BY MOUTH TWICE DAILY    . potassium chloride SA (K-DUR,KLOR-CON) 20 MEQ tablet Take 1 tablet (20 mEq total) by mouth 2 (two) times daily as needed. 60 tablet 6  . pramipexole (MIRAPEX) 0.25 MG tablet Take 0.5 mg by mouth 4 (four) times daily.    . ranitidine (ZANTAC) 150 MG capsule Take 150 mg by mouth every evening.    . SUMAtriptan (IMITREX) 50 MG tablet Take 50 mg by mouth every 2 (two) hours as needed.      Marland Kitchen  tiZANidine (ZANAFLEX) 4 MG tablet Take 4 mg by mouth every 6 (six) hours as needed for muscle spasms.    Marland Kitchen torsemide (DEMADEX) 20 MG tablet TAKE 2 TABLETS(40 MG) BY MOUTH TWICE DAILY 360 tablet 3  . traMADol (ULTRAM) 50 MG tablet Take by mouth every 6 (six) hours as needed.    . traZODone (DESYREL) 50 MG tablet Take 50 mg by mouth at bedtime.     . Triamcinolone Acetonide (TRIAMCINOLONE 0.1 % CREAM : EUCERIN) CREA Apply 1 application topically 2 (two) times daily.    . vitamin B-12 (CYANOCOBALAMIN) 1000 MCG tablet Take 1,000 mcg by mouth daily.    . cetirizine (ZYRTEC) 10 MG tablet Take by mouth.    . flecainide (TAMBOCOR) 100 MG tablet TAKE 1 TABLET(100 MG) BY MOUTH TWICE DAILY (Patient not taking: Reported on 03/13/2018) 60 tablet 0   No current facility-administered medications for this visit.     Past Medical History:  Diagnosis Date  . A-fib (HCC) 2012, 2014  . Anxiety   . Arrhythmia    A-fib  . Bursitis     hips  . Concussion   . Concussion July, 28, 2014  . GERD (gastroesophageal reflux disease)   . Hypertension   . Palpitations   . Pneumonia 2012    Past Surgical History:  Procedure Laterality Date  . ABDOMINAL SURGERY  2008   abdominal muscle mesh insert  . Arm surgery  2015   Pin implanted   . CATARACT EXTRACTION Right August 30, 2013  . CATARACT EXTRACTION Left August 09, 2013  . COLONOSCOPY    . ESOPHAGOGASTRODUODENOSCOPY    . HERNIA REPAIR Left 1970  . HERNIA REPAIR  2015   Dr. Malissa Hippo  . KNEE SURGERY Right 1980  . NOSE SURGERY    . SHOULDER SURGERY Right 2000  . TOTAL ABDOMINAL HYSTERECTOMY  1995    Social History Social History   Tobacco Use  . Smoking status: Never Smoker  . Smokeless tobacco: Former Engineer, water Use Topics  . Alcohol use: No  . Drug use: No    Family History Family History  Adopted: Yes  Problem Relation Age of Onset  . Dementia Neg Hx        ADOPTED     Allergies  Allergen Reactions  . Ace Inhibitors     Other reaction(s): Cough  . Albuterol   . Iodine Hives  . Minocycline Other (See Comments)    Onset 04/10/2001.  Marland Kitchen Norvasc [Amlodipine Besylate] Hives  . Penicillins Hives  . Propoxyphene Hives    Onset 04/10/2001. Onset 04/10/2001.  . Sulfa Antibiotics Diarrhea and Nausea Only  . Vicodin [Hydrocodone-Acetaminophen] Hives    With prolonged use   . Amlodipine Rash  . Betadine [Povidone Iodine] Hives and Rash  . Clarithromycin Rash    REVIEW OF SYSTEMS(Negative unless checked)  Constitutional: [] Weight loss[] Fever[] Chills Cardiac:[] Chest pain[] Chest pressure[x] Palpitations [] Shortness of breath when laying flat [] Shortness of breath at rest [] Shortness of breath with exertion. Vascular: [] Pain in legs with walking[] Pain in legsat rest[] Pain in legs when laying flat [] Claudication [] Pain in feet when walking [] Pain in feet at rest [] Pain in feet when laying flat [] History of DVT  [] Phlebitis [x] Swelling in legs [] Varicose veins [] Non-healing ulcers Pulmonary: [] Uses home oxygen [] Productive cough[] Hemoptysis [] Wheeze [] COPD [] Asthma Neurologic: [] Dizziness [] Blackouts [] Seizures [] History of stroke [] History of TIA[] Aphasia [] Temporary blindness[] Dysphagia [] Weaknessor numbness in arms [] Weakness or numbnessin legs Musculoskeletal: [x] Arthritis [] Joint swelling [x] Joint pain [] Low back pain Hematologic:[] Easy bruising[] Easy bleeding [] Hypercoagulable state [] Anemic []   Hepatitis Gastrointestinal:[] Blood in stool[] Vomiting blood[x] Gastroesophageal reflux/heartburn[] Abdominal pain Genitourinary: [] Chronic kidney disease [] Difficulturination [] Frequenturination [] Burning with urination[] Hematuria Skin: [] Rashes [] Ulcers [] Wounds Psychological: [] History of anxiety[] History of major depression.   Physical Examination  BP 132/68 (BP Location: Right Arm, Patient Position: Sitting)   Pulse 66   Resp 16   Ht 5\' 5"  (1.651 m)   Wt 190 lb (86.2 kg)   BMI 31.62 kg/m  Gen:  WD/WN, NAD Head: [place]/AT, No temporalis wasting. Ear/Nose/Throat: Hearing grossly intact, nares w/o erythema or drainage Eyes: Conjunctiva clear. Sclera non-icteric Neck: Supple.  Trachea midline Pulmonary:  Good air movement, no use of accessory muscles.  Cardiac: irregular Vascular:  Vessel Right Left  Radial Palpable Palpable                          PT 1+ Palpable 1+ Palpable  DP Palpable 1+ Palpable    Musculoskeletal: M/S 5/5 throughout.  No deformity or atrophy.  Mild to moderate erythema on the left lower leg particularly medially just above the ankle.  Trace right lower extremity edema.  1+ left lower extremity edema. Neurologic: Sensation grossly intact in extremities.  Symmetrical.  Speech is fluent.  Psychiatric: Judgment intact, Mood & affect appropriate for pt's clinical situation. Dermatologic:  No rashes or ulcers noted.  No cellulitis or open wounds.       Labs No results found for this or any previous visit (from the past 2160 hour(s)).  Radiology No results found.  Assessment/Plan Atrial fibrillation On anticoagulation. Cardiac dysfunction could be contributing to lower extremity swelling.  Acute on chronic diastolic CHF (congestive heart failure) (HCC) Cardiac dysfunction could be contributing to lower extremity swelling.  Hypertension blood pressure control important in reducing the progression of atherosclerotic disease. On appropriate oral medications.  Leg swelling Her duplex today shows no DVT or superficial thrombophlebitis.  Only minimal venous reflux was seen in the right common femoral vein and no venous reflux was seen on the left.  The lymphedema pump has helped her leg swelling.  I do think she probably has a component of cellulitis going on now's were going to treat that with antibiotics.  Continue the lymphedema pump and compression.  Lymphedema Continue the lymphedema pump and compression.  Cellulitis of leg, left Her leg has the appearance of her early cellulitis and I am going to start her on doxycycline today.  I would recommend continued compression and elevation.  We will see her back in a few weeks in follow-up to assess her response to antibiotics.    Festus Barren, MD  03/13/2018 12:48 PM    This note was created with Dragon medical transcription system.  Any errors from dictation are purely unintentional
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
## 2018-03-21 ENCOUNTER — Encounter: Payer: Self-pay | Admitting: Cardiovascular Disease

## 2018-03-21 ENCOUNTER — Ambulatory Visit (INDEPENDENT_AMBULATORY_CARE_PROVIDER_SITE_OTHER): Payer: Medicare Other | Admitting: Cardiovascular Disease

## 2018-03-21 VITALS — BP 140/72 | HR 52 | Ht 66.0 in | Wt 187.2 lb

## 2018-03-21 DIAGNOSIS — I10 Essential (primary) hypertension: Secondary | ICD-10-CM | POA: Diagnosis not present

## 2018-03-21 DIAGNOSIS — R600 Localized edema: Secondary | ICD-10-CM | POA: Diagnosis not present

## 2018-03-21 DIAGNOSIS — I5033 Acute on chronic diastolic (congestive) heart failure: Secondary | ICD-10-CM | POA: Diagnosis not present

## 2018-03-21 DIAGNOSIS — I480 Paroxysmal atrial fibrillation: Secondary | ICD-10-CM

## 2018-03-21 DIAGNOSIS — R002 Palpitations: Secondary | ICD-10-CM

## 2018-03-21 MED ORDER — POTASSIUM CHLORIDE CRYS ER 20 MEQ PO TBCR: 20.0000 meq | EXTENDED_RELEASE_TABLET | Freq: Every day | ORAL | 4 refills | Status: DC

## 2018-03-21 MED ORDER — METOPROLOL TARTRATE 25 MG PO TABS: 12.5000 mg | ORAL_TABLET | Freq: Two times a day (BID) | ORAL | 4 refills | Status: DC

## 2018-03-21 MED ORDER — TORSEMIDE 20 MG PO TABS: 20.0000 mg | ORAL_TABLET | Freq: Every day | ORAL | 3 refills | Status: DC

## 2018-03-21 NOTE — Patient Instructions (Signed)
Medication Instructions:   We will decrease the metoprolol down to 12.5 mg twice a day  If you need a refill on your cardiac medications before your next appointment, please call your pharmacy.    Lab work: No new labs needed   If you have labs (blood work) drawn today and your tests are completely normal, you will receive your results only by: Marland Kitchen MyChart Message (if you have MyChart) OR . A paper copy in the mail If you have any lab test that is abnormal or we need to change your treatment, we will call you to review the results.   Testing/Procedures: No new testing needed   Follow-Up: At [facility], you and your health needs are our priority.  As part of our continuing mission to provide you with exceptional heart care, we have created designated Provider Care Teams.  These Care Teams include your primary Cardiologist (physician) and Advanced Practice Providers (APPs -  Physician Assistants and Nurse Practitioners) who all work together to provide you with the care you need, when you need it.  . You will need a follow up appointment in 12 months .   Please call our office 2 months in advance to schedule this appointment.    . Providers on your designated Care Team:   . Nicolasa Ducking, NP . Eula Listen, PA-C . Marisue Ivan, PA-C  Any Other Special Instructions Will Be Listed Below (If Applicable).  For educational health videos Log in to : [email or web address] Or : [email or web address], password : [email or web address]

## 2018-03-27 ENCOUNTER — Other Ambulatory Visit: Payer: Self-pay | Admitting: Cardiovascular Disease

## 2018-04-03 ENCOUNTER — Encounter (INDEPENDENT_AMBULATORY_CARE_PROVIDER_SITE_OTHER): Payer: Self-pay | Admitting: Nurse Practitioner

## 2018-04-03 ENCOUNTER — Ambulatory Visit (INDEPENDENT_AMBULATORY_CARE_PROVIDER_SITE_OTHER): Payer: Medicare Other | Admitting: Nurse Practitioner

## 2018-04-03 VITALS — BP 139/60 | HR 60 | Resp 17 | Ht 66.0 in | Wt 190.0 lb

## 2018-04-03 DIAGNOSIS — I890 Lymphedema, not elsewhere classified: Secondary | ICD-10-CM

## 2018-04-03 DIAGNOSIS — L03116 Cellulitis of left lower limb: Secondary | ICD-10-CM | POA: Diagnosis not present

## 2018-04-05 ENCOUNTER — Encounter (INDEPENDENT_AMBULATORY_CARE_PROVIDER_SITE_OTHER): Payer: Self-pay | Admitting: Nurse Practitioner

## 2018-04-05 NOTE — Progress Notes (Signed)
Subjective:    Patient ID: [name], female    DOB: 05-Nov-1940, 77 y.o.   MRN: [id number] Chief Complaint  Patient presents with  . Follow-up    Cellulitis follow up    HPI  [name] is a 77 y.o. female that follows up today for evaluation of left leg cellulitis.  Patient states that it has improved somewhat however it has not completely resolved.  She has started taking the refill of the antibiotics that she was given previously.  Her greatest pain is near her ankle where she also exhibits small swelling.  She endorses having difficulty wearing medical grade 1 compression stockings on a daily basis.  She also utilizes her lymphedema pump daily however she utilizes 30 minutes a morning and 30 minutes in the afternoon.  The patient also endorses having itching and burning the area of the ankle which is made worse by compression stockings.  She denies any fever, chills, nausea, vomiting.  She denies any claudication-like symptoms or rest pain.  She denies any chest pain or shortness of breath.  She has no previous history of DVT.  Past Medical History:  Diagnosis Date  . A-fib (HCC) 2012, 2014  . Anxiety   . Arrhythmia    A-fib  . Bursitis    hips  . Concussion   . Concussion July, 28, 2014  . GERD (gastroesophageal reflux disease)   . Hypertension   . Palpitations   . Pneumonia 2012    Past Surgical History:  Procedure Laterality Date  . ABDOMINAL SURGERY  2008   abdominal muscle mesh insert  . Arm surgery  2015   Pin implanted   . CATARACT EXTRACTION Right August 30, 2013  . CATARACT EXTRACTION Left August 09, 2013  . COLONOSCOPY    . ESOPHAGOGASTRODUODENOSCOPY    . HERNIA REPAIR Left 1970  . HERNIA REPAIR  2015   Dr. Malissa Hippo  . KNEE SURGERY Right 1980  . NOSE SURGERY    . SHOULDER SURGERY Right 2000  . TOTAL ABDOMINAL HYSTERECTOMY  1995    Social History   Socioeconomic History  . Marital status: Widowed    Spouse name: Not on file  . Number of children:  Not on file  . Years of education: Tax adviser  . Highest education level: Not on file  Occupational History  . Not on file  Social Needs  . Financial resource strain: Not on file  . Food insecurity:    Worry: Not on file    Inability: Not on file  . Transportation needs:    Medical: Not on file    Non-medical: Not on file  Tobacco Use  . Smoking status: Never Smoker  . Smokeless tobacco: Former Engineer, water and Sexual Activity  . Alcohol use: No  . Drug use: No  . Sexual activity: Not on file  Lifestyle  . Physical activity:    Days per week: Not on file    Minutes per session: Not on file  . Stress: Not on file  Relationships  . Social connections:    Talks on phone: Not on file    Gets together: Not on file    Attends religious service: Not on file    Active member of club or organization: Not on file    Attends meetings of clubs or organizations: Not on file    Relationship status: Not on file  . Intimate partner violence:    Fear of current or ex  partner: Not on file    Emotionally abused: Not on file    Physically abused: Not on file    Forced sexual activity: Not on file  Other Topics Concern  . Not on file  Social History Narrative   Lives at home with cat.   Caffeine use: Drinks 3-4 sodas per day    Family History  Adopted: Yes  Problem Relation Age of Onset  . Dementia Neg Hx        ADOPTED    Allergies  Allergen Reactions  . Ace Inhibitors     Other reaction(s): Cough  . Albuterol   . Iodine Hives  . Minocycline Other (See Comments)    Onset 04/10/2001.  Marland Kitchen. Norvasc [Amlodipine Besylate] Hives  . Penicillins Hives  . Propoxyphene Hives    Onset 04/10/2001. Onset 04/10/2001.  . Sulfa Antibiotics Diarrhea and Nausea Only  . Vicodin [Hydrocodone-Acetaminophen] Hives    With prolonged use   . Amlodipine Rash  . Betadine [Povidone Iodine] Hives and Rash  . Clarithromycin Rash     Review of Systems   Review of Systems: Negative Unless  Checked Constitutional: [] Weight loss  [] Fever  [] Chills Cardiac: [] Chest pain   []  Atrial Fibrillation  [] Palpitations   [] Shortness of breath when laying flat   [] Shortness of breath with exertion. Vascular:  [] Pain in legs with walking   [] Pain in legs with standing  [] History of DVT   [] Phlebitis   [x] Swelling in legs   [x] Varicose veins   [] Non-healing ulcers Pulmonary:   [] Uses home oxygen   [] Productive cough   [] Hemoptysis   [] Wheeze  [] COPD   [] Asthma Neurologic:  [] Dizziness   [] Seizures   [] History of stroke   [] History of TIA  [] Aphasia   [] Vissual changes   [] Weakness or numbness in arm   [] Weakness or numbness in leg Musculoskeletal:   [] Joint swelling   [x] Joint pain   [] Low back pain  []  History of Knee Replacement Hematologic:  [] Easy bruising  [] Easy bleeding   [] Hypercoagulable state   [] Anemic Gastrointestinal:  [] Diarrhea   [] Vomiting  [] Gastroesophageal reflux/heartburn   [] Difficulty swallowing. Genitourinary:  [] Chronic kidney disease   [] Difficult urination  [] Anuric   [] Blood in urine Skin:  [x] Rashes   [] Ulcers  Psychological:  [] History of anxiety   []  History of major depression  []  Memory Difficulties     Objective:   Physical Exam  BP 139/60 (BP Location: Left Arm, Patient Position: Sitting)   Pulse 60   Resp 17   Ht 5\' 6"  (1.676 m)   Wt 190 lb (86.2 kg)   BMI 30.67 kg/m   Gen: WD/WN, NAD Head: [place]/AT, No temporalis wasting.  Ear/Nose/Throat: Hearing grossly intact, nares w/o erythema or drainage Eyes: PER, EOMI, sclera nonicteric.  Neck: Supple, no masses.  No JVD.  Pulmonary:  Good air movement, no use of accessory muscles.  Cardiac: RRR Vascular:  2+ pitting edema bilaterally at ankle Vessel Right Left  Radial Palpable Palpable  Dorsalis Pedis Palpable Palpable  Posterior Tibial Palpable Palpable   Gastrointestinal: soft, non-distended. No guarding/no peritoneal signs.  Musculoskeletal: M/S 5/5 throughout.  No deformity or atrophy.  Neurologic:  Pain and light touch intact in extremities.  Symmetrical.  Speech is fluent. Motor exam as listed above. Psychiatric: Judgment intact, Mood & affect appropriate for pt's clinical situation. Dermatologic: No Venous rashes. No Ulcers Noted.  Erythematous left lower extremity, warm to the touch.   Lymph : No Cervical lymphadenopathy, no lichenification or skin  changes of chronic lymphedema.      Assessment & Plan:   1. Lymphedema Patient endorses using her lymphedema pump daily however states that she is not always consistent with wearing compression stockings.  She is reasoning such as itching, burning, discomfort for utilizing her medical grade 1 compression stockings.  We talked about different brands of stockings, different methods to control the itching the burn.  I also advised her to utilize her lymphedema pump 1 hour in the evening versus 30 minutes in the morning and 30 minutes in the evening.  2. Cellulitis of leg, left Cellulitis somewhat improved, however still very red and warm so I have advised the patient to continue her antibiotics.  We will have her follow-up again in 2 weeks.  I have stressed to the patient that it is important to control the swelling, but has poor control prohibits healing of the cellulitis.  Advised patient that if she is still greatly swollen at next visit we may need to do unna wraps.  Current Outpatient Medications on File Prior to Visit  Medication Sig Dispense Refill  . albuterol (VENTOLIN HFA) 108 (90 Base) MCG/ACT inhaler Inhale 1-2 puffs into the lungs every 6 (six) hours as needed for wheezing or shortness of breath.    . ALPRAZolam (XANAX) 0.25 MG tablet 0.25 mg. Take 1/2 to 1 tablet as needed     . Ascorbic Acid (VITAMIN C) 1000 MG tablet Take 1,000 mg by mouth daily.    Marland Kitchen azelastine (ASTELIN) 137 MCG/SPRAY nasal spray Place 2 sprays into the nose 2 (two) times daily. Use in each nostril as directed    . beta carotene w/minerals (OCUVITE) tablet Take  2 tablets by mouth daily.     . Calcium Carbonate-Vitamin D (CALCIUM + D PO) 600 mg. Take two tablets daily    . carbamazepine (TEGRETOL) 100 MG chewable tablet Chew 100 mg by mouth 2 (two) times daily as needed.    . cetirizine (ZYRTEC) 10 MG tablet Take by mouth.    . clotrimazole-betamethasone (LOTRISONE) cream Apply 1 application topically 2 (two) times daily.    . colchicine 0.6 MG tablet Take one pill by mouth once or twice per day during gout flare    . doxycycline (VIBRA-TABS) 100 MG tablet TK 1 T PO BID  1  . ELIQUIS 5 MG TABS tablet TAKE 1 TABLET BY MOUTH TWICE DAILY 60 tablet 6  . esomeprazole (NEXIUM) 40 MG capsule Take 40 mg by mouth daily before breakfast.    . estradiol (ESTRACE) 1 MG tablet Take 1 mg by mouth daily.    Marland Kitchen Fesoterodine Fumarate (TOVIAZ PO) Take 4 mg by mouth 1 day or 1 dose.     . flecainide (TAMBOCOR) 100 MG tablet TAKE 1 TABLET(100 MG) BY MOUTH TWICE DAILY 60 tablet 0  . FLUoxetine (PROZAC) 20 MG capsule Take 20 mg by mouth daily.     . fluticasone (FLONASE) 50 MCG/ACT nasal spray 2 sprays by Nasal route daily.      Marland Kitchen ketoconazole (NIZORAL) 2 % shampoo   0  . magnesium gluconate (MAGONATE) 500 MG tablet Take 1,000 mg by mouth daily.     . metoprolol tartrate (LOPRESSOR) 25 MG tablet Take 0.5 tablets (12.5 mg total) by mouth 2 (two) times daily. 90 tablet 3  . montelukast (SINGULAIR) 10 MG tablet TAKE 1 TABLET(10 MG) BY MOUTH AT BEDTIME 90 tablet 0  . oxybutynin (DITROPAN) 5 MG tablet TAKE 1 TABLET BY MOUTH TWICE DAILY    .  pramipexole (MIRAPEX) 0.25 MG tablet Take 0.5 mg by mouth 4 (four) times daily.    . ranitidine (ZANTAC) 150 MG capsule Take 150 mg by mouth every evening.    Marland Kitchen spironolactone (ALDACTONE) 25 MG tablet   11  . SUMAtriptan (IMITREX) 50 MG tablet Take 50 mg by mouth every 2 (two) hours as needed.      Marland Kitchen tiZANidine (ZANAFLEX) 4 MG tablet Take 4 mg by mouth every 6 (six) hours as needed for muscle spasms.    Marland Kitchen torsemide (DEMADEX) 20 MG tablet  Take 1 tablet (20 mg total) by mouth daily. 90 tablet 3  . traMADol (ULTRAM) 50 MG tablet Take by mouth every 6 (six) hours as needed.    . traZODone (DESYREL) 50 MG tablet Take 25 mg by mouth at bedtime.     . Triamcinolone Acetonide (TRIAMCINOLONE 0.1 % CREAM : EUCERIN) CREA Apply 1 application topically 2 (two) times daily.    . vitamin B-12 (CYANOCOBALAMIN) 1000 MCG tablet Take 1,000 mcg by mouth daily.    . potassium chloride SA (K-DUR,KLOR-CON) 20 MEQ tablet Take 1 tablet (20 mEq total) by mouth daily. (Patient not taking: Reported on 04/03/2018) 90 tablet 4   No current facility-administered medications on file prior to visit.     There are no Patient Instructions on file for this visit. No follow-ups on file.   Georgiana Spinner, NP  This note was completed with Office manager.  Any errors are purely unintentional.

## 2018-04-08 ENCOUNTER — Other Ambulatory Visit: Payer: Self-pay | Admitting: Cardiovascular Disease

## 2018-04-18 ENCOUNTER — Ambulatory Visit (INDEPENDENT_AMBULATORY_CARE_PROVIDER_SITE_OTHER): Payer: Medicare Other | Admitting: Nurse Practitioner

## 2018-04-23 ENCOUNTER — Ambulatory Visit (INDEPENDENT_AMBULATORY_CARE_PROVIDER_SITE_OTHER): Payer: Medicare Other | Admitting: Nurse Practitioner

## 2018-05-03 ENCOUNTER — Emergency Department
Admission: EM | Admit: 2018-05-03 | Discharge: 2018-05-03 | Disposition: A | Discharge status: Home / Self Care | Payer: Medicare Other | Attending: Emergency Medicine | Admitting: Emergency Medicine

## 2018-05-03 ENCOUNTER — Emergency Department: Payer: Medicare Other

## 2018-05-03 ENCOUNTER — Telehealth: Payer: Self-pay | Admitting: Cardiovascular Disease

## 2018-05-03 ENCOUNTER — Other Ambulatory Visit: Payer: Self-pay

## 2018-05-03 ENCOUNTER — Encounter: Payer: Self-pay | Admitting: Emergency Medicine

## 2018-05-03 DIAGNOSIS — Z7901 Long term (current) use of anticoagulants: Secondary | ICD-10-CM | POA: Diagnosis not present

## 2018-05-03 DIAGNOSIS — Z79899 Other long term (current) drug therapy: Secondary | ICD-10-CM | POA: Diagnosis not present

## 2018-05-03 DIAGNOSIS — Z87891 Personal history of nicotine dependence: Secondary | ICD-10-CM | POA: Insufficient documentation

## 2018-05-03 DIAGNOSIS — I5032 Chronic diastolic (congestive) heart failure: Secondary | ICD-10-CM | POA: Insufficient documentation

## 2018-05-03 DIAGNOSIS — J189 Pneumonia, unspecified organism: Secondary | ICD-10-CM

## 2018-05-03 DIAGNOSIS — R109 Unspecified abdominal pain: Secondary | ICD-10-CM

## 2018-05-03 DIAGNOSIS — I110 Hypertensive heart disease with heart failure: Secondary | ICD-10-CM | POA: Diagnosis not present

## 2018-05-03 DIAGNOSIS — R002 Palpitations: Secondary | ICD-10-CM | POA: Diagnosis not present

## 2018-05-03 DIAGNOSIS — R1030 Lower abdominal pain, unspecified: Secondary | ICD-10-CM | POA: Diagnosis not present

## 2018-05-03 DIAGNOSIS — R112 Nausea with vomiting, unspecified: Secondary | ICD-10-CM | POA: Diagnosis present

## 2018-05-03 DIAGNOSIS — I4891 Unspecified atrial fibrillation: Secondary | ICD-10-CM | POA: Diagnosis not present

## 2018-05-03 LAB — CBC WITH DIFFERENTIAL/PLATELET
Abs Immature Granulocytes: 0.03 10*3/uL (ref 0.00–0.07)
Basophils Absolute: 0 10*3/uL (ref 0.0–0.1)
Basophils Relative: 0 %
Eosinophils Absolute: 0.1 10*3/uL (ref 0.0–0.5)
Eosinophils Relative: 1 %
HCT: 42.8 % (ref 36.0–46.0)
Hemoglobin: 14.1 g/dL (ref 12.0–15.0)
Immature Granulocytes: 0 %
Lymphocytes Relative: 4 %
Lymphs Abs: 0.4 10*3/uL — ABNORMAL LOW (ref 0.7–4.0)
MCH: 33 pg (ref 26.0–34.0)
MCHC: 32.9 g/dL (ref 30.0–36.0)
MCV: 100.2 fL — ABNORMAL HIGH (ref 80.0–100.0)
Monocytes Absolute: 0.5 10*3/uL (ref 0.1–1.0)
Monocytes Relative: 5 %
Neutro Abs: 8.6 10*3/uL — ABNORMAL HIGH (ref 1.7–7.7)
Neutrophils Relative %: 90 %
Platelets: 225 10*3/uL (ref 150–400)
RBC: 4.27 MIL/uL (ref 3.87–5.11)
RDW: 13 % (ref 11.5–15.5)
WBC: 9.6 10*3/uL (ref 4.0–10.5)
nRBC: 0 % (ref 0.0–0.2)

## 2018-05-03 LAB — COMPREHENSIVE METABOLIC PANEL
ALK PHOS: 72 U/L (ref 38–126)
ALT: 33 U/L (ref 0–44)
AST: 46 U/L — ABNORMAL HIGH (ref 15–41)
Albumin: 3.6 g/dL (ref 3.5–5.0)
Anion gap: 10 (ref 5–15)
BUN: 32 mg/dL — AB (ref 8–23)
CHLORIDE: 104 mmol/L (ref 98–111)
CO2: 25 mmol/L (ref 22–32)
Calcium: 9.9 mg/dL (ref 8.9–10.3)
Creatinine, Ser: 1.1 mg/dL — ABNORMAL HIGH (ref 0.44–1.00)
GFR calc Af Amer: 57 mL/min — ABNORMAL LOW (ref >60)
GFR, EST NON AFRICAN AMERICAN: 49 mL/min — AB (ref >60)
GLUCOSE: 138 mg/dL — AB (ref 70–99)
POTASSIUM: 4.3 mmol/L (ref 3.5–5.1)
SODIUM: 139 mmol/L (ref 135–145)
Total Bilirubin: 1.1 mg/dL (ref 0.3–1.2)
Total Protein: 6.7 g/dL (ref 6.5–8.1)

## 2018-05-03 LAB — URINALYSIS, COMPLETE (UACMP) WITH MICROSCOPIC
Bacteria, UA: NONE SEEN
Bilirubin Urine: NEGATIVE
Glucose, UA: NEGATIVE mg/dL
Hgb urine dipstick: NEGATIVE
Ketones, ur: NEGATIVE mg/dL
Leukocytes, UA: NEGATIVE
Nitrite: NEGATIVE
Protein, ur: 30 mg/dL — AB
Specific Gravity, Urine: 1.017 (ref 1.005–1.030)
pH: 5 (ref 5.0–8.0)

## 2018-05-03 LAB — TROPONIN I: Troponin I: <0.03 ng/mL (ref <0.03)

## 2018-05-03 LAB — BRAIN NATRIURETIC PEPTIDE: B Natriuretic Peptide: 112 pg/mL — ABNORMAL HIGH (ref 0.0–100.0)

## 2018-05-03 MED ORDER — METOPROLOL TARTRATE 25 MG PO TABS
25.0000 mg | ORAL_TABLET | Freq: Once | ORAL | Status: AC
  Administered 2018-05-03: 25 mg via ORAL
  Filled 2018-05-03: qty 1

## 2018-05-03 MED ORDER — AZITHROMYCIN 250 MG PO TABS: ORAL_TABLET | ORAL | 0 refills | Status: AC

## 2018-05-03 MED ORDER — PANTOPRAZOLE SODIUM 40 MG IV SOLR
80.0000 mg | Freq: Once | INTRAVENOUS | Status: AC
Start: 2018-05-03 — End: 2018-05-03
  Administered 2018-05-03: 80 mg via INTRAVENOUS
  Filled 2018-05-03: qty 80

## 2018-05-03 MED ORDER — ONDANSETRON HCL 4 MG/2ML IJ SOLN
4.0000 mg | Freq: Once | INTRAMUSCULAR | Status: AC
  Administered 2018-05-03: 4 mg via INTRAVENOUS
  Filled 2018-05-03: qty 2

## 2018-05-03 MED ORDER — AZITHROMYCIN 500 MG PO TABS: 500.0000 mg | ORAL_TABLET | Freq: Once | ORAL | Status: DC

## 2018-05-03 MED ORDER — AZITHROMYCIN 500 MG PO TABS
ORAL_TABLET | ORAL | Status: AC
  Filled 2018-05-03: qty 1

## 2018-05-03 MED ORDER — AZITHROMYCIN 500 MG PO TABS
500.0000 mg | ORAL_TABLET | Freq: Once | ORAL | Status: AC
  Administered 2018-05-03: 500 mg via ORAL

## 2018-05-03 MED ORDER — DILTIAZEM HCL 25 MG/5ML IV SOLN
15.0000 mg | Freq: Once | INTRAVENOUS | Status: DC
  Filled 2018-05-03: qty 5

## 2018-05-03 NOTE — ED Notes (Signed)
AAOx3.  Skin warm and dry.  NAD 

## 2018-05-03 NOTE — Telephone Encounter (Signed)
Can we callto follow up Heart rate was elevated, atrial fib in the ER Heart rate down?

## 2018-05-03 NOTE — ED Notes (Signed)
Upon going to give IV cardizem, HR noted in the 80's and regular, pt c/o mild nausea. EKG obtained, NSR, Dr Pershing ProudSchaevitz made aware, no IV cardizem administered.
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
## 2018-05-03 NOTE — Telephone Encounter (Signed)
Patient friend Max SaneKay Acton calling Patient wanted to make office aware that she was in afib and the ambulance has taken her to the ER

## 2018-05-03 NOTE — ED Notes (Signed)
Placed on RA.

## 2018-05-03 NOTE — ED Notes (Signed)
Pt up to bsc to urinate, hr in 80's prior to walking to bathroom, cardizem not administered yet, waiting for pt to return to bed.

## 2018-05-03 NOTE — ED Triage Notes (Signed)
ARrives via [facility] for c/o emesis this am.  States awoke this morning at 0400 vomiting.  Patietn took a zofran at that time, but was unable to keep it down.  Patient also c/o palpitations.  EMS report that pulse varied from 100-150 enroute to hospital.  Patient has history of afib and also DVT.  20 g saline lock to rAC

## 2018-05-04 NOTE — Telephone Encounter (Signed)
I spoke with the patient. She states that she has been feeling better today and her HR has been under control.  She states that she woke up yesterday morning about 4 am and threw up until she went to the ER ~ 1000. She has no idea/ nor did the ER team as to the source of her vomiting. On arrival she was in atrial fibrillation with a HR of 153 bpm. Per the patient, she thinks she shocked the ER doctor because she had already taken her meds as directed by Dr. Mariah MillingGollan and did some deep breathing.  By the time the ER doc/ nurse came back to the room with IV cardizem, her HR was back down to normal. She states she told the ER doctor that happened because she has a "very good doctor" in Dr. Mariah MillingGollan and he had already educated her on how to treat her a-fib.   Her only complaint today if rib discomfort from her emesis.  Otherwise she is feeling well. I advised we will make no changes at this time, but to please call back with any further cardiac concerns. The patient is agreeable.

## 2018-05-07 ENCOUNTER — Encounter (INDEPENDENT_AMBULATORY_CARE_PROVIDER_SITE_OTHER): Payer: Self-pay | Admitting: Nurse Practitioner

## 2018-05-07 ENCOUNTER — Ambulatory Visit (INDEPENDENT_AMBULATORY_CARE_PROVIDER_SITE_OTHER): Payer: Medicare Other | Admitting: Nurse Practitioner

## 2018-05-07 VITALS — BP 139/67 | HR 55 | Resp 16 | Ht 66.0 in | Wt 190.2 lb

## 2018-05-07 DIAGNOSIS — L03116 Cellulitis of left lower limb: Secondary | ICD-10-CM

## 2018-05-07 DIAGNOSIS — I10 Essential (primary) hypertension: Secondary | ICD-10-CM | POA: Diagnosis not present

## 2018-05-07 DIAGNOSIS — I890 Lymphedema, not elsewhere classified: Secondary | ICD-10-CM

## 2018-05-08 ENCOUNTER — Encounter (INDEPENDENT_AMBULATORY_CARE_PROVIDER_SITE_OTHER): Payer: Self-pay | Admitting: Nurse Practitioner

## 2018-05-08 NOTE — Progress Notes (Signed)
Subjective:    Patient ID: [name], female    DOB: Jul 04, 1940, 77 y.o.   MRN: [id number] Chief Complaint  Patient presents with  . Follow-up    HPI  [name] is a 77 y.o. female that is following up today after treatment for cellulitis.  Her bilateral shins are still somewhat pink, however they are not erythematous like last visit.  Patient states that she has been doing much better wearing compression socks since she is found a brand that works for her.  She also endorses utilizing her lymphedema pump on a regular basis.  The patient only issues are with her foot and ankle.  She has pain that is reproducible on palpation to her ankle through her shin.  She states that the only medicines relieve this pain is diclofenac gel.  Patient denies any fever, chills, nausea, or diarrhea.  The patient denies any chest pain or shortness of breath.  She also denies any claudication-like symptoms or rest pain like symptoms.  Past Medical History:  Diagnosis Date  . A-fib (HCC) 2012, 2014  . Anxiety   . Arrhythmia    A-fib  . Bursitis    hips  . Concussion   . Concussion July, 28, 2014  . GERD (gastroesophageal reflux disease)   . Hypertension   . Palpitations   . Pneumonia 2012    Past Surgical History:  Procedure Laterality Date  . ABDOMINAL SURGERY  2008   abdominal muscle mesh insert  . Arm surgery  2015   Pin implanted   . CATARACT EXTRACTION Right August 30, 2013  . CATARACT EXTRACTION Left August 09, 2013  . COLONOSCOPY    . ESOPHAGOGASTRODUODENOSCOPY    . HERNIA REPAIR Left 1970  . HERNIA REPAIR  2015   Dr. Malissa HippoW. Smith  . KNEE SURGERY Right 1980  . NOSE SURGERY    . SHOULDER SURGERY Right 2000  . TOTAL ABDOMINAL HYSTERECTOMY  1995    Social History   Socioeconomic History  . Marital status: Widowed    Spouse name: Not on file  . Number of children: Not on file  . Years of education: Tax adviserAssociate  . Highest education level: Not on file  Occupational History  .  Not on file  Social Needs  . Financial resource strain: Not on file  . Food insecurity:    Worry: Not on file    Inability: Not on file  . Transportation needs:    Medical: Not on file    Non-medical: Not on file  Tobacco Use  . Smoking status: Never Smoker  . Smokeless tobacco: Former Engineer, waterUser  Substance and Sexual Activity  . Alcohol use: No  . Drug use: No  . Sexual activity: Not on file  Lifestyle  . Physical activity:    Days per week: Not on file    Minutes per session: Not on file  . Stress: Not on file  Relationships  . Social connections:    Talks on phone: Not on file    Gets together: Not on file    Attends religious service: Not on file    Active member of club or organization: Not on file    Attends meetings of clubs or organizations: Not on file    Relationship status: Not on file  . Intimate partner violence:    Fear of current or ex partner: Not on file    Emotionally abused: Not on file    Physically abused: Not on file  Forced sexual activity: Not on file  Other Topics Concern  . Not on file  Social History Narrative   Lives at home with cat.   Caffeine use: Drinks 3-4 sodas per day    Family History  Adopted: Yes  Problem Relation Age of Onset  . Dementia Neg Hx        ADOPTED    Allergies  Allergen Reactions  . Ace Inhibitors     Other reaction(s): Cough  . Albuterol   . Iodine Hives  . Minocycline Other (See Comments)    Onset 04/10/2001.  Marland Kitchen Norvasc [Amlodipine Besylate] Hives  . Penicillins Hives  . Propoxyphene Hives    Onset 04/10/2001. Onset 04/10/2001.  . Sulfa Antibiotics Diarrhea and Nausea Only  . Vicodin [Hydrocodone-Acetaminophen] Hives    With prolonged use   . Amlodipine Rash  . Betadine [Povidone Iodine] Hives and Rash  . Clarithromycin Rash     Review of Systems   Review of Systems: Negative Unless Checked Constitutional: [] Weight loss  [] Fever  [] Chills Cardiac: [] Chest pain   [x]  Atrial Fibrillation   [] Palpitations   [] Shortness of breath when laying flat   [] Shortness of breath with exertion. Vascular:  [] Pain in legs with walking   [] Pain in legs with standing  [] History of DVT   [] Phlebitis   [x] Swelling in legs   [x] Varicose veins   [] Non-healing ulcers Pulmonary:   [] Uses home oxygen   [] Productive cough   [] Hemoptysis   [] Wheeze  [] COPD   [] Asthma Neurologic:  [] Dizziness   [] Seizures   [] History of stroke   [] History of TIA  [] Aphasia   [] Vissual changes   [] Weakness or numbness in arm   [] Weakness or numbness in leg Musculoskeletal:   [] Joint swelling   [] Joint pain   [x] Low back pain  []  History of Knee Replacement Hematologic:  [] Easy bruising  [] Easy bleeding   [] Hypercoagulable state   [x] Anemic Gastrointestinal:  [] Diarrhea   [] Vomiting  [] Gastroesophageal reflux/heartburn   [] Difficulty swallowing. Genitourinary:  [] Chronic kidney disease   [] Difficult urination  [] Anuric   [] Blood in urine Skin:  [] Rashes   [] Ulcers  Psychological:  [] History of anxiety   []  History of major depression  [x]  Memory Difficulties     Objective:   Physical Exam  BP 139/67 (BP Location: Right Arm, Patient Position: Sitting)   Pulse (!) 55   Resp 16   Ht 5\' 6"  (1.676 m)   Wt 190 lb 3.2 oz (86.3 kg)   BMI 30.70 kg/m   Gen: WD/WN, NAD Head: [place]/AT, No temporalis wasting.  Ear/Nose/Throat: Hearing grossly intact, nares w/o erythema or drainage Eyes: PER, EOMI, sclera nonicteric.  Neck: Supple, no masses.  No JVD.  Pulmonary:  Good air movement, no use of accessory muscles.  Cardiac: RRR Vascular:  1+ soft edema bilaterally Vessel Right Left  Radial Palpable Palpable  Dorsalis Pedis Palpable Palpable  Posterior Tibial Palpable Palpable   Gastrointestinal: soft, non-distended. No guarding/no peritoneal signs.  Musculoskeletal: M/S 5/5 throughout.  No deformity or atrophy.  Neurologic: Pain and light touch intact in extremities.  Symmetrical.  Speech is fluent. Motor exam as listed  above. Psychiatric: Judgment intact, Mood & affect appropriate for pt's clinical situation. Dermatologic:  Bilateral venous rashes. No Ulcers Noted.  No changes consistent with cellulitis. Lymph : No Cervical lymphadenopathy, no lichenification or skin changes of chronic lymphedema.      Assessment & Plan:   1. Lymphedema Patient will continue to wear medical grade 1 compression stockings  on a daily basis.  Told to continue to utilize her lymphedema pump on a daily basis, for 1 hour in the evening.  As far as the pain in her foot I have instructed her to contact her podiatrist as well as her primary care provider to get further x-rays determine that there are no musculoskeletal causes for her pain.  We will follow-up with [name] in 6 months from her studies.  I have also advised her that if she begins to have open ulcerations, weeping, or a recurrence of cellulitis she should contact us sooner.  2. Cellulitis of leg, left Patient's previous episode of cellulitis has resolved.  We counseled patient as to the importance of good control of lymphedema to limit the possibility of recurrence of cellulitis.  3. Essential hypertension Continue antihypertensive medications as already ordered, these medications have been reviewed and there are no changes at this time.    Current Outpatient Medications on File Prior to Visit  Medication Sig Dispense Refill  . albuterol (VENTOLIN HFA) 108 (90 Base) MCG/ACT inhaler Inhale 1-2 puffs into the lungs every 6 (six) hours as needed for wheezing or shortness of breath.    . ALPRAZolam (XANAX) 0.25 MG tablet 0.25 mg. Take 1/2 to 1 tablet as needed     . Ascorbic Acid (VITAMIN C) 1000 MG tablet Take 1,000 mg by mouth daily.    Marland Kitchen azelastine (ASTELIN) 137 MCG/SPRAY nasal spray Place 2 sprays into the nose 2 (two) times daily. Use in each nostril as directed    . azithromycin (ZITHROMAX Z-PAK) 250 MG tablet Take 2 tablets (500 mg) on  Day 1,  followed by 1  tablet (250 mg) once daily on Days 2 through 5. 6 each 0  . baclofen (LIORESAL) 10 MG tablet Take 10 mg by mouth 3 (three) times daily.    . beta carotene w/minerals (OCUVITE) tablet Take 2 tablets by mouth daily.     . Calcium Carbonate-Vitamin D (CALCIUM + D PO) 600 mg. Take two tablets daily    . carbamazepine (TEGRETOL) 100 MG chewable tablet Chew 100 mg by mouth 2 (two) times daily as needed.    . cetirizine (ZYRTEC) 10 MG tablet Take by mouth.    . clotrimazole-betamethasone (LOTRISONE) cream Apply 1 application topically 2 (two) times daily.    Marland Kitchen ELIQUIS 5 MG TABS tablet TAKE 1 TABLET BY MOUTH TWICE DAILY (Patient taking differently: Take 5 mg by mouth 2 (two) times daily. ) 60 tablet 6  . esomeprazole (NEXIUM) 40 MG capsule Take 40 mg by mouth daily before breakfast.    . estradiol (ESTRACE) 1 MG tablet Take 0.5 mg by mouth daily.     Marland Kitchen Fesoterodine Fumarate (TOVIAZ PO) Take 4 mg by mouth 1 day or 1 dose.     . flecainide (TAMBOCOR) 100 MG tablet TAKE 1 TABLET BY MOUTH TWICE DAILY (Patient taking differently: Take 100 mg by mouth 2 (two) times daily. TAKE 1 TABLET BY MOUTH TWICE DAILY) 60 tablet 10  . FLUoxetine (PROZAC) 20 MG capsule Take 20 mg by mouth daily.     . fluticasone (FLONASE) 50 MCG/ACT nasal spray 2 sprays by Nasal route daily.      Marland Kitchen ketoconazole (NIZORAL) 2 % shampoo   0  . magnesium gluconate (MAGONATE) 500 MG tablet Take 1,000 mg by mouth daily.     . metoprolol tartrate (LOPRESSOR) 25 MG tablet Take 0.5 tablets (12.5 mg total) by mouth 2 (two) times daily. 90 tablet 3  .  montelukast (SINGULAIR) 10 MG tablet TAKE 1 TABLET(10 MG) BY MOUTH AT BEDTIME (Patient taking differently: Take 10 mg by mouth at bedtime. ) 90 tablet 0  . Multiple Vitamin (MULTIVITAMIN) tablet Take 1 tablet by mouth every evening.    Marland Kitchen oxybutynin (DITROPAN) 5 MG tablet TAKE 1 TABLET BY MOUTH TWICE DAILY    . pramipexole (MIRAPEX) 0.25 MG tablet Take 0.5 mg by mouth 4 (four) times daily.    .  ranitidine (ZANTAC) 150 MG capsule Take 150 mg by mouth every evening.    Marland Kitchen spironolactone (ALDACTONE) 25 MG tablet   11  . torsemide (DEMADEX) 20 MG tablet Take 1 tablet (20 mg total) by mouth daily. 90 tablet 3  . traZODone (DESYREL) 50 MG tablet Take 25 mg by mouth at bedtime.     . Triamcinolone Acetonide (TRIAMCINOLONE 0.1 % CREAM : EUCERIN) CREA Apply 1 application topically 2 (two) times daily.    . vitamin B-12 (CYANOCOBALAMIN) 1000 MCG tablet Take 1,000 mcg by mouth daily.    . colchicine 0.6 MG tablet Take one pill by mouth once or twice per day during gout flare    . doxycycline (VIBRA-TABS) 100 MG tablet TK 1 T PO BID  1  . ondansetron (ZOFRAN) 4 MG tablet Take 4 mg by mouth every 6 (six) hours as needed for nausea or vomiting.    . SUMAtriptan (IMITREX) 50 MG tablet Take 50 mg by mouth every 2 (two) hours as needed.      Marland Kitchen tiZANidine (ZANAFLEX) 4 MG tablet Take 4 mg by mouth every 6 (six) hours as needed for muscle spasms.    . traMADol (ULTRAM) 50 MG tablet Take by mouth every 6 (six) hours as needed.     No current facility-administered medications on file prior to visit.     There are no Patient Instructions on file for this visit. Return in about 6 months (around 11/06/2018).   Georgiana Spinner, NP  This note was completed with Office manager.  Any errors are purely unintentional.

## 2018-05-15 ENCOUNTER — Ambulatory Visit (INDEPENDENT_AMBULATORY_CARE_PROVIDER_SITE_OTHER): Payer: Medicare Other | Admitting: Vascular Surgery

## 2018-05-15 ENCOUNTER — Encounter

## 2018-05-15 ENCOUNTER — Encounter (INDEPENDENT_AMBULATORY_CARE_PROVIDER_SITE_OTHER): Payer: Medicare Other

## 2018-06-05 ENCOUNTER — Other Ambulatory Visit: Payer: Self-pay | Admitting: Cardiovascular Disease

## 2018-06-05 NOTE — Telephone Encounter (Signed)
Refill Request.  

## 2018-07-15 ENCOUNTER — Emergency Department (HOSPITAL_COMMUNITY)
Admission: EM | Admit: 2018-07-15 | Discharge: 2018-07-15 | Disposition: A | Discharge status: Home / Self Care | Payer: Medicare Other | Attending: Emergency Medicine | Admitting: Emergency Medicine

## 2018-07-15 ENCOUNTER — Emergency Department (HOSPITAL_COMMUNITY): Payer: Medicare Other

## 2018-07-15 ENCOUNTER — Encounter (HOSPITAL_COMMUNITY): Payer: Self-pay | Admitting: Pharmacy Technician

## 2018-07-15 DIAGNOSIS — Z7901 Long term (current) use of anticoagulants: Secondary | ICD-10-CM | POA: Diagnosis not present

## 2018-07-15 DIAGNOSIS — I5033 Acute on chronic diastolic (congestive) heart failure: Secondary | ICD-10-CM | POA: Insufficient documentation

## 2018-07-15 DIAGNOSIS — Z79899 Other long term (current) drug therapy: Secondary | ICD-10-CM | POA: Insufficient documentation

## 2018-07-15 DIAGNOSIS — R519 Headache, unspecified: Secondary | ICD-10-CM

## 2018-07-15 DIAGNOSIS — I110 Hypertensive heart disease with heart failure: Secondary | ICD-10-CM | POA: Diagnosis not present

## 2018-07-15 DIAGNOSIS — R51 Headache: Secondary | ICD-10-CM | POA: Insufficient documentation

## 2018-07-15 LAB — CBC
HEMATOCRIT: 38.6 % (ref 36.0–46.0)
Hemoglobin: 12.2 g/dL (ref 12.0–15.0)
MCH: 32.4 pg (ref 26.0–34.0)
MCHC: 31.6 g/dL (ref 30.0–36.0)
MCV: 102.7 fL — ABNORMAL HIGH (ref 80.0–100.0)
Platelets: 175 10*3/uL (ref 150–400)
RBC: 3.76 MIL/uL — ABNORMAL LOW (ref 3.87–5.11)
RDW: 12.9 % (ref 11.5–15.5)
WBC: 9.5 10*3/uL (ref 4.0–10.5)
nRBC: 0 % (ref 0.0–0.2)

## 2018-07-15 LAB — BASIC METABOLIC PANEL
Anion gap: 7 (ref 5–15)
BUN: 24 mg/dL — ABNORMAL HIGH (ref 8–23)
CO2: 23 mmol/L (ref 22–32)
Calcium: 9.3 mg/dL (ref 8.9–10.3)
Chloride: 111 mmol/L (ref 98–111)
Creatinine, Ser: 1.16 mg/dL — ABNORMAL HIGH (ref 0.44–1.00)
GFR calc Af Amer: 53 mL/min — ABNORMAL LOW (ref >60)
GFR calc non Af Amer: 46 mL/min — ABNORMAL LOW (ref >60)
Glucose, Bld: 125 mg/dL — ABNORMAL HIGH (ref 70–99)
Potassium: 3.9 mmol/L (ref 3.5–5.1)
Sodium: 141 mmol/L (ref 135–145)

## 2018-07-15 MED ORDER — LORAZEPAM 2 MG/ML IJ SOLN
1.0000 mg | Freq: Once | INTRAMUSCULAR | Status: AC
  Administered 2018-07-15: 1 mg via INTRAVENOUS
  Filled 2018-07-15: qty 1

## 2018-07-15 MED ORDER — KETOROLAC TROMETHAMINE 30 MG/ML IJ SOLN
30.0000 mg | Freq: Once | INTRAMUSCULAR | Status: AC
  Administered 2018-07-15: 30 mg via INTRAVENOUS
  Filled 2018-07-15: qty 1

## 2018-07-15 MED ORDER — ONDANSETRON HCL 4 MG/2ML IJ SOLN
4.0000 mg | Freq: Once | INTRAMUSCULAR | Status: AC
  Administered 2018-07-15: 4 mg via INTRAVENOUS
  Filled 2018-07-15: qty 2

## 2018-07-15 MED ORDER — SODIUM CHLORIDE 0.9 % IV BOLUS
1000.0000 mL | Freq: Once | INTRAVENOUS | Status: AC
  Administered 2018-07-15: 1000 mL via INTRAVENOUS

## 2018-07-15 MED ORDER — SODIUM CHLORIDE 0.9 % IV SOLN
INTRAVENOUS | Status: DC
  Administered 2018-07-15: 22:00:00 via INTRAVENOUS

## 2018-07-15 MED ORDER — MORPHINE SULFATE (PF) 4 MG/ML IV SOLN
4.0000 mg | Freq: Once | INTRAVENOUS | Status: AC
  Administered 2018-07-15: 4 mg via INTRAVENOUS
  Filled 2018-07-15: qty 1

## 2018-07-15 NOTE — ED Triage Notes (Signed)
Pt arrives via ems from home after sudden onset of headache 0700 today. Per ems pain has progressively gotten worse and pt classifies the pain as "the worst headache of her life. Pt on blood thinners. 158/80, HR 75 NSR, RR 18. 18g L arm.
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
## 2018-11-07 ENCOUNTER — Ambulatory Visit (INDEPENDENT_AMBULATORY_CARE_PROVIDER_SITE_OTHER): Payer: Medicare Other | Admitting: Nurse Practitioner

## 2018-12-09 ENCOUNTER — Emergency Department
Admission: EM | Admit: 2018-12-09 | Discharge: 2018-12-09 | Disposition: A | Discharge status: Home / Self Care | Payer: Medicare Other | Attending: Emergency Medicine | Admitting: Emergency Medicine

## 2018-12-09 ENCOUNTER — Other Ambulatory Visit: Payer: Self-pay

## 2018-12-09 DIAGNOSIS — Z79899 Other long term (current) drug therapy: Secondary | ICD-10-CM | POA: Insufficient documentation

## 2018-12-09 DIAGNOSIS — Z87891 Personal history of nicotine dependence: Secondary | ICD-10-CM | POA: Insufficient documentation

## 2018-12-09 DIAGNOSIS — I10 Essential (primary) hypertension: Secondary | ICD-10-CM | POA: Insufficient documentation

## 2018-12-09 DIAGNOSIS — I4811 Longstanding persistent atrial fibrillation: Secondary | ICD-10-CM

## 2018-12-09 DIAGNOSIS — Z7901 Long term (current) use of anticoagulants: Secondary | ICD-10-CM | POA: Diagnosis not present

## 2018-12-09 DIAGNOSIS — R040 Epistaxis: Secondary | ICD-10-CM | POA: Diagnosis present

## 2018-12-09 LAB — BASIC METABOLIC PANEL
Anion gap: 11 (ref 5–15)
BUN: 32 mg/dL — ABNORMAL HIGH (ref 8–23)
CO2: 27 mmol/L (ref 22–32)
Calcium: 10 mg/dL (ref 8.9–10.3)
Chloride: 102 mmol/L (ref 98–111)
Creatinine, Ser: 1.14 mg/dL — ABNORMAL HIGH (ref 0.44–1.00)
GFR calc Af Amer: 54 mL/min — ABNORMAL LOW (ref >60)
GFR calc non Af Amer: 47 mL/min — ABNORMAL LOW (ref >60)
Glucose, Bld: 122 mg/dL — ABNORMAL HIGH (ref 70–99)
Potassium: 3.8 mmol/L (ref 3.5–5.1)
Sodium: 140 mmol/L (ref 135–145)

## 2018-12-09 LAB — CBC WITH DIFFERENTIAL/PLATELET
Abs Immature Granulocytes: 0.04 10*3/uL (ref 0.00–0.07)
Basophils Absolute: 0.1 10*3/uL (ref 0.0–0.1)
Basophils Relative: 1 %
Eosinophils Absolute: 0.2 10*3/uL (ref 0.0–0.5)
Eosinophils Relative: 3 %
HCT: 43.2 % (ref 36.0–46.0)
Hemoglobin: 14 g/dL (ref 12.0–15.0)
Immature Granulocytes: 1 %
Lymphocytes Relative: 15 %
Lymphs Abs: 1 10*3/uL (ref 0.7–4.0)
MCH: 32.5 pg (ref 26.0–34.0)
MCHC: 32.4 g/dL (ref 30.0–36.0)
MCV: 100.2 fL — ABNORMAL HIGH (ref 80.0–100.0)
Monocytes Absolute: 0.5 10*3/uL (ref 0.1–1.0)
Monocytes Relative: 7 %
Neutro Abs: 4.9 10*3/uL (ref 1.7–7.7)
Neutrophils Relative %: 73 %
Platelets: 203 10*3/uL (ref 150–400)
RBC: 4.31 MIL/uL (ref 3.87–5.11)
RDW: 13.4 % (ref 11.5–15.5)
WBC: 6.6 10*3/uL (ref 4.0–10.5)
nRBC: 0 % (ref 0.0–0.2)

## 2018-12-09 LAB — PROTIME-INR
INR: 1.2 (ref 0.8–1.2)
Prothrombin Time: 14.9 seconds (ref 11.4–15.2)

## 2018-12-09 MED ORDER — OXYMETAZOLINE HCL 0.05 % NA SOLN
1.0000 | Freq: Once | NASAL | Status: AC
  Administered 2018-12-09: 1 via NASAL
  Filled 2018-12-09: qty 30

## 2018-12-09 MED ORDER — TRANEXAMIC ACID 1000 MG/10ML IV SOLN
500.0000 mg | Freq: Once | INTRAVENOUS | Status: AC
  Administered 2018-12-09: 13:00:00 500 mg via TOPICAL
  Filled 2018-12-09: qty 10

## 2018-12-09 NOTE — Discharge Instructions (Addendum)
Do not take your Eliquis today or tomorrow.  You can resume taking it on Tuesday, July 21.  This will allow your nose time to heal without causing the bleeding to happen again.  Continue taking all your other medications.
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
## 2018-12-09 NOTE — ED Triage Notes (Signed)
Pt arrived via EMS from home d/t nose bleed while cleaning with Clorox. Pt is on eliquis with hx of A. Fib. Pt A&O x4 at this time with no c/o pain.

## 2018-12-13 ENCOUNTER — Other Ambulatory Visit: Payer: Self-pay | Admitting: Cardiovascular Disease

## 2018-12-13 ENCOUNTER — Other Ambulatory Visit: Payer: Self-pay | Admitting: Pharmacist

## 2018-12-13 NOTE — Telephone Encounter (Signed)
61f 86.3kg Scr 1.14 12/09/18 Lovw/gollan 03/21/18 Drug-Drug: Eliquis and carbamazepine Plasma concentrations and pharmacologic effects of Apixaban may be decreased or increased by Strong CYP3A4 and P-gP Inducers. Coadministration of Apixaban and Strong CYP3A4 and P-gP Inducers should be avoided according to official package labeling. Awaiting pharmd approval

## 2018-12-13 NOTE — Telephone Encounter (Signed)
Called pt - she uses Tegretol once or twice a year for trigeminal neuralgia. Ok to continue on Eliquis due to very infrequent use of carbamazepine.

## 2018-12-13 NOTE — Telephone Encounter (Signed)
Refill Request.  

## 2019-02-03 ENCOUNTER — Emergency Department: Payer: Medicare Other

## 2019-02-03 ENCOUNTER — Other Ambulatory Visit: Payer: Self-pay

## 2019-02-03 ENCOUNTER — Inpatient Hospital Stay
Admission: EM | Admit: 2019-02-03 | Discharge: 2019-02-06 | DRG: 872 | Disposition: A | Discharge status: Home / Self Care | Payer: Medicare Other | Attending: Internal Medicine | Admitting: Internal Medicine

## 2019-02-03 DIAGNOSIS — A4151 Sepsis due to Escherichia coli [E. coli]: Principal | ICD-10-CM | POA: Diagnosis present

## 2019-02-03 DIAGNOSIS — Z79899 Other long term (current) drug therapy: Secondary | ICD-10-CM

## 2019-02-03 DIAGNOSIS — N10 Acute tubulo-interstitial nephritis: Secondary | ICD-10-CM | POA: Diagnosis present

## 2019-02-03 DIAGNOSIS — Z880 Allergy status to penicillin: Secondary | ICD-10-CM

## 2019-02-03 DIAGNOSIS — Z888 Allergy status to other drugs, medicaments and biological substances status: Secondary | ICD-10-CM

## 2019-02-03 DIAGNOSIS — Z87891 Personal history of nicotine dependence: Secondary | ICD-10-CM

## 2019-02-03 DIAGNOSIS — Z90710 Acquired absence of both cervix and uterus: Secondary | ICD-10-CM

## 2019-02-03 DIAGNOSIS — K760 Fatty (change of) liver, not elsewhere classified: Secondary | ICD-10-CM | POA: Diagnosis present

## 2019-02-03 DIAGNOSIS — Z7951 Long term (current) use of inhaled steroids: Secondary | ICD-10-CM | POA: Diagnosis not present

## 2019-02-03 DIAGNOSIS — A419 Sepsis, unspecified organism: Secondary | ICD-10-CM | POA: Diagnosis present

## 2019-02-03 DIAGNOSIS — R05 Cough: Secondary | ICD-10-CM | POA: Diagnosis present

## 2019-02-03 DIAGNOSIS — Z79890 Hormone replacement therapy (postmenopausal): Secondary | ICD-10-CM | POA: Diagnosis not present

## 2019-02-03 DIAGNOSIS — Z7901 Long term (current) use of anticoagulants: Secondary | ICD-10-CM | POA: Diagnosis not present

## 2019-02-03 DIAGNOSIS — Z8679 Personal history of other diseases of the circulatory system: Secondary | ICD-10-CM | POA: Diagnosis not present

## 2019-02-03 DIAGNOSIS — Z20828 Contact with and (suspected) exposure to other viral communicable diseases: Secondary | ICD-10-CM | POA: Diagnosis present

## 2019-02-03 DIAGNOSIS — Z881 Allergy status to other antibiotic agents status: Secondary | ICD-10-CM

## 2019-02-03 DIAGNOSIS — R6520 Severe sepsis without septic shock: Secondary | ICD-10-CM | POA: Diagnosis present

## 2019-02-03 DIAGNOSIS — R531 Weakness: Secondary | ICD-10-CM | POA: Diagnosis present

## 2019-02-03 DIAGNOSIS — I480 Paroxysmal atrial fibrillation: Secondary | ICD-10-CM | POA: Diagnosis present

## 2019-02-03 DIAGNOSIS — K219 Gastro-esophageal reflux disease without esophagitis: Secondary | ICD-10-CM | POA: Diagnosis present

## 2019-02-03 DIAGNOSIS — Z8701 Personal history of pneumonia (recurrent): Secondary | ICD-10-CM

## 2019-02-03 DIAGNOSIS — I10 Essential (primary) hypertension: Secondary | ICD-10-CM | POA: Diagnosis present

## 2019-02-03 DIAGNOSIS — Z885 Allergy status to narcotic agent status: Secondary | ICD-10-CM

## 2019-02-03 DIAGNOSIS — J189 Pneumonia, unspecified organism: Secondary | ICD-10-CM

## 2019-02-03 DIAGNOSIS — R112 Nausea with vomiting, unspecified: Secondary | ICD-10-CM

## 2019-02-03 DIAGNOSIS — F419 Anxiety disorder, unspecified: Secondary | ICD-10-CM | POA: Diagnosis present

## 2019-02-03 DIAGNOSIS — Z882 Allergy status to sulfonamides status: Secondary | ICD-10-CM

## 2019-02-03 LAB — URINALYSIS, COMPLETE (UACMP) WITH MICROSCOPIC
Bilirubin Urine: NEGATIVE
Glucose, UA: NEGATIVE mg/dL
Hgb urine dipstick: NEGATIVE
Ketones, ur: NEGATIVE mg/dL
Leukocytes,Ua: NEGATIVE
Nitrite: NEGATIVE
Protein, ur: NEGATIVE mg/dL
Specific Gravity, Urine: 1.012 (ref 1.005–1.030)
pH: 5 (ref 5.0–8.0)

## 2019-02-03 LAB — COMPREHENSIVE METABOLIC PANEL
ALT: 236 U/L — ABNORMAL HIGH (ref 0–44)
AST: 232 U/L — ABNORMAL HIGH (ref 15–41)
Albumin: 3.3 g/dL — ABNORMAL LOW (ref 3.5–5.0)
Alkaline Phosphatase: 87 U/L (ref 38–126)
Anion gap: 9 (ref 5–15)
BUN: 21 mg/dL (ref 8–23)
CO2: 23 mmol/L (ref 22–32)
Calcium: 9.4 mg/dL (ref 8.9–10.3)
Chloride: 108 mmol/L (ref 98–111)
Creatinine, Ser: 0.59 mg/dL (ref 0.44–1.00)
GFR calc Af Amer: >60 mL/min (ref >60)
GFR calc non Af Amer: >60 mL/min (ref >60)
Glucose, Bld: 112 mg/dL — ABNORMAL HIGH (ref 70–99)
Potassium: 3.4 mmol/L — ABNORMAL LOW (ref 3.5–5.1)
Sodium: 140 mmol/L (ref 135–145)
Total Bilirubin: 2.6 mg/dL — ABNORMAL HIGH (ref 0.3–1.2)
Total Protein: 5.8 g/dL — ABNORMAL LOW (ref 6.5–8.1)

## 2019-02-03 LAB — CBC WITH DIFFERENTIAL/PLATELET
Abs Immature Granulocytes: 0.06 10*3/uL (ref 0.00–0.07)
Basophils Absolute: 0 10*3/uL (ref 0.0–0.1)
Basophils Relative: 0 %
Eosinophils Absolute: 0 10*3/uL (ref 0.0–0.5)
Eosinophils Relative: 0 %
HCT: 41.4 % (ref 36.0–46.0)
Hemoglobin: 13.8 g/dL (ref 12.0–15.0)
Immature Granulocytes: 1 %
Lymphocytes Relative: 4 %
Lymphs Abs: 0.4 10*3/uL — ABNORMAL LOW (ref 0.7–4.0)
MCH: 32.4 pg (ref 26.0–34.0)
MCHC: 33.3 g/dL (ref 30.0–36.0)
MCV: 97.2 fL (ref 80.0–100.0)
Monocytes Absolute: 0.1 10*3/uL (ref 0.1–1.0)
Monocytes Relative: 1 %
Neutro Abs: 9.1 10*3/uL — ABNORMAL HIGH (ref 1.7–7.7)
Neutrophils Relative %: 94 %
Platelets: 154 10*3/uL (ref 150–400)
RBC: 4.26 MIL/uL (ref 3.87–5.11)
RDW: 13.3 % (ref 11.5–15.5)
WBC: 9.8 10*3/uL (ref 4.0–10.5)
nRBC: 0 % (ref 0.0–0.2)

## 2019-02-03 LAB — BLOOD CULTURE ID PANEL (REFLEXED)

## 2019-02-03 LAB — PROTIME-INR
INR: 1.1 (ref 0.8–1.2)
Prothrombin Time: 14.1 seconds (ref 11.4–15.2)

## 2019-02-03 LAB — LACTIC ACID, PLASMA
Lactic Acid, Venous: 2.2 mmol/L (ref 0.5–1.9)
Lactic Acid, Venous: 2.1 mmol/L (ref 0.5–1.9)

## 2019-02-03 LAB — SARS CORONAVIRUS 2 BY RT PCR (HOSPITAL ORDER, PERFORMED IN ~~LOC~~ HOSPITAL LAB): SARS Coronavirus 2: NEGATIVE

## 2019-02-03 MED ORDER — FLUOXETINE HCL 20 MG PO CAPS
20.0000 mg | ORAL_CAPSULE | Freq: Every day | ORAL | Status: DC
  Administered 2019-02-03 – 2019-02-06 (×4): 20 mg via ORAL
  Filled 2019-02-03 (×4): qty 1

## 2019-02-03 MED ORDER — KETOROLAC TROMETHAMINE 30 MG/ML IJ SOLN
30.0000 mg | Freq: Once | INTRAMUSCULAR | Status: AC
  Administered 2019-02-03: 30 mg via INTRAVENOUS
  Filled 2019-02-03: qty 1

## 2019-02-03 MED ORDER — CARBAMAZEPINE 100 MG PO CHEW
100.0000 mg | CHEWABLE_TABLET | Freq: Two times a day (BID) | ORAL | Status: DC | PRN
  Administered 2019-02-05: 100 mg via ORAL
  Filled 2019-02-03 (×2): qty 1

## 2019-02-03 MED ORDER — BUTORPHANOL TARTRATE 10 MG/ML NA SOLN: 1.0000 | NASAL | Status: DC | PRN

## 2019-02-03 MED ORDER — ESTRADIOL 1 MG PO TABS
0.5000 mg | ORAL_TABLET | Freq: Every day | ORAL | Status: DC
  Administered 2019-02-03 – 2019-02-06 (×4): 0.5 mg via ORAL
  Filled 2019-02-03 (×4): qty 0.5

## 2019-02-03 MED ORDER — METOPROLOL TARTRATE 5 MG/5ML IV SOLN
5.0000 mg | Freq: Once | INTRAVENOUS | Status: AC
  Administered 2019-02-03: 5 mg via INTRAVENOUS
  Filled 2019-02-03: qty 5

## 2019-02-03 MED ORDER — TRAMADOL HCL 50 MG PO TABS: 50.0000 mg | ORAL_TABLET | Freq: Three times a day (TID) | ORAL | Status: DC | PRN

## 2019-02-03 MED ORDER — VITAMIN C 500 MG PO TABS
1000.0000 mg | ORAL_TABLET | Freq: Every day | ORAL | Status: DC
  Administered 2019-02-03 – 2019-02-06 (×4): 1000 mg via ORAL
  Filled 2019-02-03 (×4): qty 2

## 2019-02-03 MED ORDER — SUMATRIPTAN SUCCINATE 50 MG PO TABS
50.0000 mg | ORAL_TABLET | ORAL | Status: DC | PRN
  Administered 2019-02-03 – 2019-02-05 (×8): 50 mg via ORAL
  Filled 2019-02-03 (×10): qty 1

## 2019-02-03 MED ORDER — VITAMIN B-6 50 MG PO TABS
200.0000 mg | ORAL_TABLET | Freq: Every day | ORAL | Status: DC
  Administered 2019-02-03 – 2019-02-06 (×4): 200 mg via ORAL
  Filled 2019-02-03 (×4): qty 4

## 2019-02-03 MED ORDER — PRAMIPEXOLE DIHYDROCHLORIDE 0.25 MG PO TABS
0.2500 mg | ORAL_TABLET | Freq: Four times a day (QID) | ORAL | Status: DC
  Administered 2019-02-03 – 2019-02-06 (×12): 0.25 mg via ORAL
  Filled 2019-02-03 (×12): qty 1

## 2019-02-03 MED ORDER — ALPRAZOLAM 0.25 MG PO TABS
0.2500 mg | ORAL_TABLET | Freq: Three times a day (TID) | ORAL | Status: DC | PRN
  Administered 2019-02-05 (×2): 0.25 mg via ORAL
  Filled 2019-02-03 (×2): qty 1

## 2019-02-03 MED ORDER — BACLOFEN 10 MG PO TABS
10.0000 mg | ORAL_TABLET | Freq: Three times a day (TID) | ORAL | Status: DC | PRN
  Administered 2019-02-04 – 2019-02-05 (×2): 10 mg via ORAL
  Filled 2019-02-03 (×3): qty 1

## 2019-02-03 MED ORDER — VITAMIN B-12 1000 MCG PO TABS
1000.0000 ug | ORAL_TABLET | Freq: Every day | ORAL | Status: DC
  Administered 2019-02-03 – 2019-02-05 (×3): 1000 ug via ORAL
  Filled 2019-02-03 (×3): qty 1

## 2019-02-03 MED ORDER — METOPROLOL TARTRATE 25 MG PO TABS
12.5000 mg | ORAL_TABLET | Freq: Two times a day (BID) | ORAL | Status: DC
  Administered 2019-02-03 – 2019-02-06 (×6): 12.5 mg via ORAL
  Filled 2019-02-03 (×6): qty 1

## 2019-02-03 MED ORDER — ONDANSETRON HCL 4 MG/2ML IJ SOLN
4.0000 mg | Freq: Four times a day (QID) | INTRAMUSCULAR | Status: DC | PRN
  Administered 2019-02-05: 4 mg via INTRAVENOUS
  Filled 2019-02-03: qty 2

## 2019-02-03 MED ORDER — SODIUM CHLORIDE 0.9 % IV SOLN
2.0000 g | Freq: Three times a day (TID) | INTRAVENOUS | Status: DC
  Administered 2019-02-03: 2 g via INTRAVENOUS
  Filled 2019-02-03 (×3): qty 2

## 2019-02-03 MED ORDER — SODIUM CHLORIDE 0.9 % IV BOLUS
500.0000 mL | Freq: Once | INTRAVENOUS | Status: AC
  Administered 2019-02-03: 500 mL via INTRAVENOUS

## 2019-02-03 MED ORDER — TRAMADOL HCL 50 MG PO TABS
50.0000 mg | ORAL_TABLET | Freq: Four times a day (QID) | ORAL | Status: DC | PRN
  Administered 2019-02-03 – 2019-02-05 (×5): 50 mg via ORAL
  Filled 2019-02-03 (×5): qty 1

## 2019-02-03 MED ORDER — ADULT MULTIVITAMIN W/MINERALS CH
1.0000 | ORAL_TABLET | Freq: Every day | ORAL | Status: DC
  Administered 2019-02-04 – 2019-02-05 (×2): 1 via ORAL
  Filled 2019-02-03 (×5): qty 1

## 2019-02-03 MED ORDER — SODIUM CHLORIDE 0.9 % IV SOLN
INTRAVENOUS | Status: DC
  Administered 2019-02-03 – 2019-02-05 (×3): via INTRAVENOUS

## 2019-02-03 MED ORDER — SPIRONOLACTONE 25 MG PO TABS
12.5000 mg | ORAL_TABLET | Freq: Every day | ORAL | Status: DC | PRN
  Filled 2019-02-03: qty 0.5

## 2019-02-03 MED ORDER — ONDANSETRON HCL 4 MG PO TABS: 4.0000 mg | ORAL_TABLET | Freq: Four times a day (QID) | ORAL | Status: DC | PRN

## 2019-02-03 MED ORDER — SODIUM CHLORIDE 0.9 % IV SOLN
1.0000 g | Freq: Three times a day (TID) | INTRAVENOUS | Status: DC
  Administered 2019-02-04 – 2019-02-06 (×8): 1 g via INTRAVENOUS
  Filled 2019-02-03 (×11): qty 1

## 2019-02-03 MED ORDER — POLYETHYLENE GLYCOL 3350 17 G PO PACK: 17.0000 g | PACK | Freq: Every day | ORAL | Status: DC | PRN

## 2019-02-03 MED ORDER — FLECAINIDE ACETATE 100 MG PO TABS
100.0000 mg | ORAL_TABLET | Freq: Two times a day (BID) | ORAL | Status: DC
  Administered 2019-02-03 – 2019-02-06 (×6): 100 mg via ORAL
  Filled 2019-02-03 (×7): qty 1

## 2019-02-03 MED ORDER — METRONIDAZOLE IN NACL 5-0.79 MG/ML-% IV SOLN
500.0000 mg | Freq: Once | INTRAVENOUS | Status: AC
  Administered 2019-02-03: 500 mg via INTRAVENOUS
  Filled 2019-02-03: qty 100

## 2019-02-03 MED ORDER — OCUVITE-LUTEIN PO CAPS
1.0000 | ORAL_CAPSULE | Freq: Two times a day (BID) | ORAL | Status: DC
  Administered 2019-02-03 – 2019-02-06 (×6): 1 via ORAL
  Filled 2019-02-03 (×7): qty 1

## 2019-02-03 MED ORDER — PANTOPRAZOLE SODIUM 40 MG PO TBEC
40.0000 mg | DELAYED_RELEASE_TABLET | Freq: Every day | ORAL | Status: DC
  Administered 2019-02-04 – 2019-02-06 (×3): 40 mg via ORAL
  Filled 2019-02-03 (×3): qty 1

## 2019-02-03 MED ORDER — COLCHICINE 0.6 MG PO TABS: 0.6000 mg | ORAL_TABLET | Freq: Two times a day (BID) | ORAL | Status: DC | PRN

## 2019-02-03 MED ORDER — TRAZODONE HCL 50 MG PO TABS
25.0000 mg | ORAL_TABLET | Freq: Every day | ORAL | Status: DC
  Administered 2019-02-03 – 2019-02-05 (×3): 25 mg via ORAL
  Filled 2019-02-03 (×3): qty 1

## 2019-02-03 MED ORDER — OXYBUTYNIN CHLORIDE 5 MG PO TABS
5.0000 mg | ORAL_TABLET | Freq: Every evening | ORAL | Status: DC
  Administered 2019-02-03 – 2019-02-05 (×3): 5 mg via ORAL
  Filled 2019-02-03 (×3): qty 1

## 2019-02-03 MED ORDER — CALCIUM CARBONATE-VITAMIN D 500-200 MG-UNIT PO TABS
1.0000 | ORAL_TABLET | Freq: Two times a day (BID) | ORAL | Status: DC
  Administered 2019-02-03 – 2019-02-06 (×6): 1 via ORAL
  Filled 2019-02-03 (×6): qty 1

## 2019-02-03 MED ORDER — POTASSIUM CHLORIDE 10 MEQ/100ML IV SOLN
10.0000 meq | INTRAVENOUS | Status: AC
  Administered 2019-02-03 (×2): 10 meq via INTRAVENOUS
  Filled 2019-02-03 (×8): qty 100

## 2019-02-03 MED ORDER — SODIUM CHLORIDE 0.9 % IV SOLN
2.0000 g | Freq: Once | INTRAVENOUS | Status: AC
  Administered 2019-02-03: 2 g via INTRAVENOUS
  Filled 2019-02-03: qty 2

## 2019-02-03 MED ORDER — VANCOMYCIN HCL IN DEXTROSE 1-5 GM/200ML-% IV SOLN
1000.0000 mg | Freq: Once | INTRAVENOUS | Status: AC
  Administered 2019-02-03: 1000 mg via INTRAVENOUS
  Filled 2019-02-03: qty 200

## 2019-02-03 MED ORDER — APIXABAN 5 MG PO TABS
5.0000 mg | ORAL_TABLET | Freq: Two times a day (BID) | ORAL | Status: DC
  Administered 2019-02-03 – 2019-02-06 (×6): 5 mg via ORAL
  Filled 2019-02-03 (×6): qty 1

## 2019-02-03 MED ORDER — POTASSIUM CHLORIDE 10 MEQ/100ML IV SOLN
10.0000 meq | INTRAVENOUS | Status: AC
  Administered 2019-02-03 – 2019-02-04 (×2): 10 meq via INTRAVENOUS

## 2019-02-03 NOTE — Consult Note (Signed)
Pharmacy Antibiotic Note  [name] is a 78 y.o. female admitted on 02/03/2019 with bacteremia.  Bcx 4 of 4 bottles GNR. BCID shows E. Coli.  Pharmacy has been consulted for Meropenem dosing.  Plan: Start Meropenem 1g IV every 8 hours.   Height: 5\' 6"  (167.6 cm) Weight: 186 lb 6.4 oz (84.6 kg) IBW/kg (Calculated) : 59.3  Temp (24hrs), Avg:99.1 F (37.3 C), Min:98.4 F (36.9 C), Max:100.2 F (37.9 C)  Recent Labs  Lab 02/03/19 1201 02/03/19 1416  WBC 9.8  --   CREATININE 0.59  --   LATICACIDVEN 2.2* 2.1*    Estimated Creatinine Clearance: 65.5 mL/min (by C-G formula based on SCr of 0.59 mg/dL).    Allergies  Allergen Reactions  . Ace Inhibitors Cough  . Albuterol Other (See Comments)    Unknown reaction  . Iodine Hives  . Minocycline Other (See Comments)    Onset 04/10/2001.  / unknown reaction  . Norvasc [Amlodipine Besylate] Hives  . Penicillins Hives    Did it involve swelling of the face/tongue/throat, SOB, or low BP? Unknown Did it involve sudden or severe rash/hives, skin peeling, or any reaction on the inside of your mouth or nose? Unknown Did you need to seek medical attention at a hospital or doctor's office? Unknown When did it last happen?>36 years ago If all above answers are "NO", may proceed with cephalosporin use.  Marland Kitchen Propoxyphene Hives    Onset 04/10/2001.   . Sulfa Antibiotics Diarrhea and Nausea Only  . Vicodin [Hydrocodone-Acetaminophen] Hives    Hives occur after 5 doses   . Amlodipine Rash  . Betadine [Povidone Iodine] Rash and Other (See Comments)    blisters  . Clarithromycin Rash    Antimicrobials this admission: 9/13 Cefepime >>  9/13 9/13 meropenem >>   Microbiology results: 9/13  BCx: 4 of 4 GNR 9/13 BCID: E. Coli  Thank you for allowing pharmacy to be a part of this patient's care.  Pernell Dupre, PharmD, BCPS Clinical Pharmacist 02/03/2019 10:32 PM

## 2019-02-03 NOTE — Progress Notes (Signed)
Patient is growing 4 out of 4 bottles of blood culture with gram-negative rods.  BCID showing E. Coli.  Will change cefepime to meropenem.

## 2019-02-03 NOTE — Progress Notes (Signed)
Family Meeting Note  Advance Directive:yes  Today a meeting took place with the Patient.and daughter    The following clinical team members were present during this meeting:MD  The following were discussed:Patient's diagnosis:sepsis , Patient's progosis: > 12 months and Goals for treatment: Full Code  Additional follow-up to be provided: no need to update or create AD   Time spent during discussion:16 minutes  Bettey Costa, MD

## 2019-02-03 NOTE — ED Notes (Addendum)
Pt desats to 89%- pt placed on 2L O2

## 2019-02-03 NOTE — ED Triage Notes (Signed)
Pt arrives via EMS from home after she woke up at 5 AM with fever, chills, and diarrhea- pt has had 500 ml of NS- pt Afib on EMS monitor  102.4 axillary 120 HR 32RR

## 2019-02-03 NOTE — ED Notes (Signed)
Pt transported to room 208 
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
## 2019-02-03 NOTE — ED Notes (Signed)
Patient transported to CT 

## 2019-02-03 NOTE — H&P (Signed)
[employer] [facility] [place] [facility]   PATIENT NAME: [name]    MR#:  [id number]  DATE OF BIRTH:  1940/08/26  DATE OF ADMISSION:  02/03/2019  PRIMARY CARE PHYSICIAN: Danella Penton, MD   REQUESTING/REFERRING PHYSICIAN: Cyril Loosen  CHIEF COMPLAINT:   fever HISTORY OF PRESENT ILLNESS:  [name]  is a 78 y.o. female with a known history of PAF on anticoagulation who presents today to the emergency room due to fever and generalized malaise.  Patient reports over the past day she has had body pain all over and had a fever this morning.  Patient has a cough but she says is a chronic cough.  Patient denies dysuria, frequency urgency.  Patient denies chest pain or shortness of breath.  Patient was in her usual state of health 2 days ago.  She denies flank pain or abdominal pain. Upon arrival to the emergency room she was mildly hypoxic with 92% on room air.  Lactic acid level was elevated and she presented with tachycardia, tachypnea and a temperature of 100.2.  She was placed on broad-spectrum antibiotics.  She has an allergy to penicillin which she reports is hives. PAST MEDICAL HISTORY:   Past Medical History:  Diagnosis Date  . A-fib (HCC) 2012, 2014  . Anxiety   . Arrhythmia    A-fib  . Bursitis    hips  . Concussion   . Concussion July, 28, 2014  . GERD (gastroesophageal reflux disease)   . Hypertension   . Palpitations   . Pneumonia 2012    PAST SURGICAL HISTORY:   Past Surgical History:  Procedure Laterality Date  . ABDOMINAL SURGERY  2008   abdominal muscle mesh insert  . Arm surgery  2015   Pin implanted   . CATARACT EXTRACTION Right August 30, 2013  . CATARACT EXTRACTION Left August 09, 2013  . COLONOSCOPY    . ESOPHAGOGASTRODUODENOSCOPY    . HERNIA REPAIR Left 1970  . HERNIA REPAIR  2015   Dr. Malissa Hippo  . KNEE SURGERY Right 1980  . NOSE SURGERY    . SHOULDER SURGERY Right 2000  . TOTAL ABDOMINAL HYSTERECTOMY  1995    SOCIAL HISTORY:   Social  History   Tobacco Use  . Smoking status: Never Smoker  . Smokeless tobacco: Former Engineer, water Use Topics  . Alcohol use: No    FAMILY HISTORY:   Family History  Adopted: Yes  Problem Relation Age of Onset  . Dementia Neg Hx        ADOPTED    DRUG ALLERGIES:   Allergies  Allergen Reactions  . Ace Inhibitors Cough  . Albuterol Other (See Comments)    Unknown reaction  . Iodine Hives  . Minocycline Other (See Comments)    Onset 04/10/2001.  / unknown reaction  . Norvasc [Amlodipine Besylate] Hives  . Penicillins Hives    Did it involve swelling of the face/tongue/throat, SOB, or low BP? Unknown Did it involve sudden or severe rash/hives, skin peeling, or any reaction on the inside of your mouth or nose? Unknown Did you need to seek medical attention at a hospital or doctor's office? Unknown When did it last happen?>36 years ago If all above answers are "NO", may proceed with cephalosporin use.  Marland Kitchen Propoxyphene Hives    Onset 04/10/2001.   . Sulfa Antibiotics Diarrhea and Nausea Only  . Vicodin [Hydrocodone-Acetaminophen] Hives    Hives occur after 5 doses   . Amlodipine Rash  .  Betadine [Povidone Iodine] Rash and Other (See Comments)    blisters  . Clarithromycin Rash    REVIEW OF SYSTEMS:   Review of Systems  Constitutional: Positive for chills, fever and malaise/fatigue.  HENT: Negative.  Negative for ear discharge, ear pain, hearing loss, nosebleeds and sore throat.   Eyes: Negative.  Negative for blurred vision and pain.  Respiratory: Negative.  Negative for cough, hemoptysis, shortness of breath and wheezing.   Cardiovascular: Negative.  Negative for chest pain, palpitations and leg swelling.  Gastrointestinal: Negative.  Negative for abdominal pain, blood in stool, diarrhea, nausea and vomiting.  Genitourinary: Negative.  Negative for dysuria.  Musculoskeletal: Negative.  Negative for back pain.  Skin: Negative.   Neurological: Negative for  dizziness, tremors, speech change, focal weakness, seizures and headaches.  Endo/Heme/Allergies: Negative.  Does not bruise/bleed easily.  Psychiatric/Behavioral: Negative.  Negative for depression, hallucinations and suicidal ideas.    MEDICATIONS AT HOME:   Prior to Admission medications   Medication Sig Start Date End Date Taking? Authorizing Provider  albuterol (VENTOLIN HFA) 108 (90 Base) MCG/ACT inhaler Inhale 1-2 puffs into the lungs every 6 (six) hours as needed for wheezing or shortness of breath.    [provider]  ALPRAZolam Prudy Feeler(XANAX) 0.25 MG tablet Take 0.25 mg by mouth 3 (three) times daily as needed for anxiety.     [provider]  Ascorbic Acid (VITAMIN C) 1000 MG tablet Take 1,000 mg by mouth daily.    [provider]  azelastine (ASTELIN) 137 MCG/SPRAY nasal spray Place 2 sprays into both nostrils daily.     [provider]  baclofen (LIORESAL) 10 MG tablet Take 10 mg by mouth 3 (three) times daily as needed for muscle spasms.     [provider]  beta carotene w/minerals (OCUVITE) tablet Take 1 tablet by mouth 2 (two) times daily.     [provider]  Calcium Carb-Cholecalciferol 600-400 MG-UNIT TABS Take 1 tablet by mouth 2 (two) times daily.    [provider]  carbamazepine (TEGRETOL) 100 MG chewable tablet Chew 100 mg by mouth 2 (two) times daily as needed (trigeminal neuralgia).     [provider]  colchicine 0.6 MG tablet Take 0.6 mg by mouth 2 (two) times daily as needed (gout flare).     [provider]  diclofenac sodium (VOLTAREN) 1 % GEL Apply 1 application topically 2 (two) times daily as needed (foot pain).     [provider]  ELIQUIS 5 MG TABS tablet TAKE 1 TABLET(5 MG) BY MOUTH TWICE DAILY 12/13/18   Antonieta IbaGollan, Timothy J, MD  esomeprazole (NEXIUM) 20 MG packet Take 20 mg by mouth daily before breakfast.     [provider]  estradiol (ESTRACE) 1 MG tablet Take 0.5 mg  by mouth daily.     [provider]  flecainide (TAMBOCOR) 100 MG tablet TAKE 1 TABLET BY MOUTH TWICE DAILY Patient taking differently: Take 100 mg by mouth 2 (two) times daily.  04/09/18   Antonieta IbaGollan, Timothy J, MD  FLUoxetine (PROZAC) 20 MG capsule Take 20 mg by mouth daily.     [provider]  fluticasone (FLONASE) 50 MCG/ACT nasal spray Place 2 sprays into both nostrils every evening.     [provider]  guaiFENesin-dextromethorphan (ROBITUSSIN DM) 100-10 MG/5ML syrup Take 5 mLs by mouth every 8 (eight) hours as needed for cough.    [provider]  metoprolol tartrate (LOPRESSOR) 25 MG tablet Take 0.5 tablets (12.5 mg  total) by mouth 2 (two) times daily. 03/27/18   Minna Merritts, MD  montelukast (SINGULAIR) 10 MG tablet TAKE 1 TABLET(10 MG) BY MOUTH AT BEDTIME Patient taking differently: Take 10 mg by mouth at bedtime.  11/21/16   Laverle Hobby, MD  Multiple Vitamin (MULTIVITAMIN WITH MINERALS) TABS tablet Take 1 tablet by mouth daily with supper.    [provider]  ondansetron (ZOFRAN) 4 MG tablet Take 4 mg by mouth every 6 (six) hours as needed for nausea or vomiting.    [provider]  oxybutynin (DITROPAN) 5 MG tablet Take 5 mg by mouth every evening.  10/01/14   [provider]  potassium chloride (K-DUR) 10 MEQ tablet Take 10 mEq by mouth 2 (two) times daily.    [provider]  pramipexole (MIRAPEX) 0.5 MG tablet Take 0.25 mg by mouth 4 (four) times daily.     [provider]  pyridoxine (B-6) 200 MG tablet Take 200 mg by mouth daily.    [provider]  spironolactone (ALDACTONE) 25 MG tablet Take 12.5 mg by mouth daily as needed (fluid/edema (take with torsemide)).  03/27/18   [provider]  SUMAtriptan (IMITREX) 50 MG tablet Take 50 mg by mouth every 2 (two) hours as needed for migraine.     [provider]  torsemide (DEMADEX) 20 MG tablet Take 1 tablet (20 mg total)  by mouth daily. Patient taking differently: Take 30 mg by mouth daily as needed (fluid/edema (take with spironolactone)).  03/21/18   Minna Merritts, MD  traMADol (ULTRAM) 50 MG tablet Take 50 mg by mouth 3 (three) times daily as needed (pain).     [provider]  traZODone (DESYREL) 50 MG tablet Take 25 mg by mouth at bedtime.  07/03/12   [provider]  triamcinolone lotion (KENALOG) 0.1 % Apply 1 application topically 3 (three) times daily as needed (itching/rash).    [provider]  vitamin B-12 (CYANOCOBALAMIN) 1000 MCG tablet Take 1,000 mcg by mouth at bedtime.     [provider]      VITAL SIGNS:  Blood pressure (!) 125/58, pulse 96, temperature 100.2 F (37.9 C), temperature source Oral, resp. rate (!) 21, height 5\' 6"  (1.676 m), weight 84.6 kg, SpO2 95 %.  PHYSICAL EXAMINATION:   Physical Exam Constitutional:      General: She is not in acute distress. HENT:     Head: Normocephalic.     Mouth/Throat:     Mouth: Mucous membranes are moist.  Eyes:     General: No scleral icterus. Neck:     Musculoskeletal: Normal range of motion and neck supple.     Vascular: No JVD.     Trachea: No tracheal deviation.  Cardiovascular:     Rate and Rhythm: Normal rate and regular rhythm.     Heart sounds: Normal heart sounds. No murmur. No friction rub. No gallop.   Pulmonary:     Effort: Pulmonary effort is normal. No respiratory distress.     Breath sounds: Normal breath sounds. No wheezing or rales.  Chest:     Chest wall: No tenderness.  Abdominal:     General: Bowel sounds are normal. There is no distension.     Palpations: Abdomen is soft. There is no mass.     Tenderness: There is no abdominal tenderness. There is no guarding or rebound.  Musculoskeletal: Normal range of motion.  Skin:    General: Skin is warm.  Findings: No erythema or rash.     Comments: Right shin she has healed scab  Neurological:     General: No focal  deficit present.     Mental Status: She is alert and oriented to person, place, and time. Mental status is at baseline.     Cranial Nerves: Cranial nerve deficit present.     Motor: Weakness present.  Psychiatric:        Judgment: Judgment normal.       LABORATORY PANEL:   CBC Recent Labs  Lab 02/03/19 1201  WBC 9.8  HGB 13.8  HCT 41.4  PLT 154   ------------------------------------------------------------------------------------------------------------------  Chemistries  Recent Labs  Lab 02/03/19 1201  NA 140  K 3.4*  CL 108  CO2 23  GLUCOSE 112*  BUN 21  CREATININE 0.59  CALCIUM 9.4  AST 232*  ALT 236*  ALKPHOS 87  BILITOT 2.6*   ------------------------------------------------------------------------------------------------------------------  Cardiac Enzymes No results for input(s): TROPONINI in the last 168 hours. ------------------------------------------------------------------------------------------------------------------  RADIOLOGY:  Ct Abdomen Pelvis Wo Contrast  Result Date: 02/03/2019 CLINICAL DATA:  Abdominal pain with fever diarrhea EXAM: CT ABDOMEN AND PELVIS WITHOUT CONTRAST TECHNIQUE: Multidetector CT imaging of the abdomen and pelvis was performed following the standard protocol without IV contrast. COMPARISON:  Ultrasound 02/03/2019, CT 05/03/2018 FINDINGS: Lower chest: Lung bases demonstrate bronchiectasis in the lower lobes and mild subpleural scarring and fibrosis. No acute consolidations or pleural effusion. Cardiomegaly. Large hiatal hernia. Hepatobiliary: No focal liver abnormality is seen. Status post cholecystectomy. No biliary dilatation. Pancreas: Atrophic and fatty replaced. No definitive inflammatory change or ductal dilatation Spleen: Accessory splenule.  Upper normal in size. Adrenals/Urinary Tract: Adrenal glands are normal. No hydronephrosis. New left greater than right perinephric fat stranding and fluid. Bladder is unremarkable  Stomach/Bowel: Stomach is nondilated. No dilated small bowel. Negative appendix. Diffuse diverticular disease of the colon without acute inflammatory change Vascular/Lymphatic: Scattered aortic atherosclerosis. No aneurysm. No significantly enlarged lymph nodes Reproductive: Status post hysterectomy. No adnexal masses. Other: Negative for free air or free fluid Musculoskeletal: Chronic compression deformity at L5. No acute or suspicious osseous abnormality. IMPRESSION: 1. Interim finding of left greater than right perinephric fat stranding and fluid, nonspecific and suggests medical renal disease, consider pyelonephritis in the appropriate clinical setting. No hydronephrosis or stone disease is visualized. 2. Large hiatal hernia. 3. Diffuse diverticular disease of the colon without acute inflammatory process. 4. Cardiomegaly. Bilateral lower lobe bronchiectasis and mild subpleural fibrosis and scarring Electronically Signed   By: Jasmine Pang M.D.   On: 02/03/2019 16:03   Dg Chest Portable 1 View  Result Date: 02/03/2019 CLINICAL DATA:  Acute fever and chills. EXAM: PORTABLE CHEST 1 VIEW COMPARISON:  05/03/2018 radiograph FINDINGS: Cardiomegaly and mild peribronchial thickening again noted. There is no evidence of focal airspace disease, pulmonary edema, suspicious pulmonary nodule/mass, pleural effusion, or pneumothorax. No acute bony abnormalities are identified. No interval change from the prior study. IMPRESSION: Cardiomegaly without evidence of acute cardiopulmonary disease. Electronically Signed   By: Harmon Pier M.D.   On: 02/03/2019 12:54   US Abdomen Limited Ruq  Result Date: 02/03/2019 CLINICAL DATA:  78 year old female with nausea and vomiting. History of cholecystectomy. EXAM: ULTRASOUND ABDOMEN LIMITED RIGHT UPPER QUADRANT COMPARISON:  None. FINDINGS: Gallbladder: Not visualized compatible with cholecystectomy. Common bile duct: Diameter: 10 mm, unchanged from 02/01/2011 CT. No intrahepatic  biliary dilatation. Liver: Increased hepatic echogenicity noted. No focal hepatic lesions are present. Portal vein is patent on color Doppler imaging with normal  direction of blood flow towards the liver. Other: None. IMPRESSION: 1. No acute abnormality. 2. Hepatic steatosis. Electronically Signed   By: Harmon PierJeffrey  Hu M.D.   On: 02/03/2019 15:42    EKG:  Rate 105 no ST elevation or depression  IMPRESSION AND PLAN:   78 year old female with PAF on anticoagulation who presents to the emergency room due to fever and generalized malaise.  1.  Sepsis: Patient presents with tachycardia, tachypnea, elevated lactic acid level and fever of 100.2 UA is unremarkable however CT scan may be concerning for pyelonephritis. LFTs are elevated and ultrasound shows hepatic steatosis. Chest x-ray shows no acute abnormality I will empirically cover with aztreonam. Repeat chest x-ray in a.m.. Follow lactic acid level and blood and urine cultures Continue IV fluids  2.  Elevated LFTs: CT scan results as above no evidence of acute cholecystitis. Repeat in a.m.  3.  PAF: She will continue Eliquis for CVA prevention. Continue metoprolol and flecainide for heart rate control.  4 Continue metoprolol  All the records are reviewed and case discussed with ED provider. Management plans discussed with the patient and daughter and they are in agreement  CODE STATUS: full  TOTAL TIME TAKING CARE OF THIS PATIENT: 50 minutes.    Adrian SaranSital Alistar Mcenery M.D on 02/03/2019 at 4:28 PM  Between 7am to 6pm - Pager - [phone]  After 6pm go to [email or web address] - Social research officer, governmentpassword EPAS [email or web address]  [facility]  Office  [phone]  CC: Primary care physician; Danella PentonMiller, Mark F, MD

## 2019-02-03 NOTE — Consult Note (Signed)
Pharmacy Antibiotic Note  [name] is a 78 y.o. female admitted on 02/03/2019 with sepsis.  Pharmacy has been consulted for Cefepime dosing.  (confirmed 2 x history of Cephalosporin use in past year - see previous consult note0  Plan: Cefepime 2g q 8h  Height: 5\' 6"  (167.6 cm) Weight: 186 lb 6.4 oz (84.6 kg) IBW/kg (Calculated) : 59.3  Temp (24hrs), Avg:100.2 F (37.9 C), Min:100.2 F (37.9 C), Max:100.2 F (37.9 C)  Recent Labs  Lab 02/03/19 1201 02/03/19 1416  WBC 9.8  --   CREATININE 0.59  --   LATICACIDVEN 2.2* 2.1*    Estimated Creatinine Clearance: 65.5 mL/min (by C-G formula based on SCr of 0.59 mg/dL).    Allergies  Allergen Reactions  . Ace Inhibitors Cough  . Albuterol Other (See Comments)    Unknown reaction  . Iodine Hives  . Minocycline Other (See Comments)    Onset 04/10/2001.  / unknown reaction  . Norvasc [Amlodipine Besylate] Hives  . Penicillins Hives    Did it involve swelling of the face/tongue/throat, SOB, or low BP? Unknown Did it involve sudden or severe rash/hives, skin peeling, or any reaction on the inside of your mouth or nose? Unknown Did you need to seek medical attention at a hospital or doctor's office? Unknown When did it last happen?>36 years ago If all above answers are "NO", may proceed with cephalosporin use.  Marland Kitchen Propoxyphene Hives    Onset 04/10/2001.   . Sulfa Antibiotics Diarrhea and Nausea Only  . Vicodin [Hydrocodone-Acetaminophen] Hives    Hives occur after 5 doses   . Amlodipine Rash  . Betadine [Povidone Iodine] Rash and Other (See Comments)    blisters  . Clarithromycin Rash    Antimicrobials this admission: 9/13 Vancomycin/Aztreonam/Metronidazole x 1 9/13 Cefepime >>  Dose adjustments this admission: None  Microbiology results: 9/13 BCx: pending  COVID NEG  Thank you for allowing pharmacy to be a part of this patient's care.  Lu Duffel, PharmD, BCPS Clinical Pharmacist 02/03/2019 5:26  PM

## 2019-02-03 NOTE — Consult Note (Addendum)
PHARMACY -  BRIEF ANTIBIOTIC NOTE   Pharmacy has received consult(s) for Vancomycin/Aztreonam from an ED provider.    The patient's profile has been reviewed for ht/wt/allergies/indication/available labs.    Reached out to to [employer] in [place] and spoke with [place], [place]  Patient has 2 documented Cephalosporin uses within the last year  Cefuroxime 09/13/18  Keflex 06/12/18  One time order(s) placed by ED provider for Aztreonam 2g and Vancomycin 1g  Further antibiotics/pharmacy consults should be ordered by admitting physician if indicated.                       Thank you,  Lu Duffel, PharmD, BCPS Clinical Pharmacist 02/03/2019 2:43 PM

## 2019-02-03 NOTE — Progress Notes (Signed)
PHARMACY - PHYSICIAN COMMUNICATION CRITICAL VALUE ALERT - BLOOD CULTURE IDENTIFICATION (BCID)  [name] is an 78 y.o. female who presented to [facility] on 02/03/2019 with a chief complaint of fever  Assessment:  Admitted with Sepsis. CT concerning for pyelonephritis. BCx 4 of 4 positive for GNR. BCID shows E. Coli.   Name of physician (or Provider) Contacted: Dr. Manuella Ghazi  Current antibiotics: Cefepime  Changes to prescribed antibiotics recommended:  Cefepime will be changed to meropenem.   Results for orders placed or performed during the hospital encounter of 02/03/19  Blood Culture ID Panel (Reflexed) (Collected: 02/03/2019 12:02 PM)  Result Value Ref Range   Enterococcus species NOT DETECTED NOT DETECTED   Listeria monocytogenes NOT DETECTED NOT DETECTED   Staphylococcus species NOT DETECTED NOT DETECTED   Staphylococcus aureus (BCID) NOT DETECTED NOT DETECTED   Streptococcus species NOT DETECTED NOT DETECTED   Streptococcus agalactiae NOT DETECTED NOT DETECTED   Streptococcus pneumoniae NOT DETECTED NOT DETECTED   Streptococcus pyogenes NOT DETECTED NOT DETECTED   Acinetobacter baumannii NOT DETECTED NOT DETECTED   Enterobacteriaceae species DETECTED (A) NOT DETECTED   Enterobacter cloacae complex NOT DETECTED NOT DETECTED   Escherichia coli DETECTED (A) NOT DETECTED   Klebsiella oxytoca NOT DETECTED NOT DETECTED   Klebsiella pneumoniae NOT DETECTED NOT DETECTED   Proteus species NOT DETECTED NOT DETECTED   Serratia marcescens NOT DETECTED NOT DETECTED   Carbapenem resistance NOT DETECTED NOT DETECTED   Haemophilus influenzae NOT DETECTED NOT DETECTED   Neisseria meningitidis NOT DETECTED NOT DETECTED   Pseudomonas aeruginosa NOT DETECTED NOT DETECTED   Candida albicans NOT DETECTED NOT DETECTED   Candida glabrata NOT DETECTED NOT DETECTED   Candida krusei NOT DETECTED NOT DETECTED   Candida parapsilosis NOT DETECTED NOT DETECTED   Candida tropicalis NOT DETECTED NOT  DETECTED    Pernell Dupre, PharmD, BCPS Clinical Pharmacist 02/03/2019 10:30 PM

## 2019-02-03 NOTE — ED Notes (Signed)
US at bedside

## 2019-02-03 NOTE — ED Notes (Signed)
Pt up to use bathroom 

## 2019-02-04 ENCOUNTER — Inpatient Hospital Stay: Payer: Medicare Other

## 2019-02-04 LAB — COMPREHENSIVE METABOLIC PANEL
ALT: 161 U/L — ABNORMAL HIGH (ref 0–44)
AST: 93 U/L — ABNORMAL HIGH (ref 15–41)
Albumin: 2.8 g/dL — ABNORMAL LOW (ref 3.5–5.0)
Alkaline Phosphatase: 67 U/L (ref 38–126)
Anion gap: 5 (ref 5–15)
BUN: 19 mg/dL (ref 8–23)
CO2: 23 mmol/L (ref 22–32)
Calcium: 8.9 mg/dL (ref 8.9–10.3)
Chloride: 110 mmol/L (ref 98–111)
Creatinine, Ser: 0.81 mg/dL (ref 0.44–1.00)
GFR calc Af Amer: >60 mL/min (ref >60)
GFR calc non Af Amer: >60 mL/min (ref >60)
Glucose, Bld: 118 mg/dL — ABNORMAL HIGH (ref 70–99)
Potassium: 4 mmol/L (ref 3.5–5.1)
Sodium: 138 mmol/L (ref 135–145)
Total Bilirubin: 2.3 mg/dL — ABNORMAL HIGH (ref 0.3–1.2)
Total Protein: 5.4 g/dL — ABNORMAL LOW (ref 6.5–8.1)

## 2019-02-04 LAB — CBC
HCT: 39.6 % (ref 36.0–46.0)
Hemoglobin: 12.8 g/dL (ref 12.0–15.0)
MCH: 32.4 pg (ref 26.0–34.0)
MCHC: 32.3 g/dL (ref 30.0–36.0)
MCV: 100.3 fL — ABNORMAL HIGH (ref 80.0–100.0)
Platelets: 146 10*3/uL — ABNORMAL LOW (ref 150–400)
RBC: 3.95 MIL/uL (ref 3.87–5.11)
RDW: 13.8 % (ref 11.5–15.5)
WBC: 11.4 10*3/uL — ABNORMAL HIGH (ref 4.0–10.5)
nRBC: 0 % (ref 0.0–0.2)

## 2019-02-04 MED ORDER — KETOROLAC TROMETHAMINE 15 MG/ML IJ SOLN
15.0000 mg | Freq: Once | INTRAMUSCULAR | Status: AC
  Administered 2019-02-04: 15 mg via INTRAVENOUS
  Filled 2019-02-04: qty 1

## 2019-02-04 NOTE — Evaluation (Signed)
Physical Therapy Evaluation Patient Details Name: Homero Fellersnn B Westhoff MRN: [id number] DOB: 1940-11-11 Today's Date: 02/04/2019   History of Present Illness  Pt is a 78 y.o. female presenting to hospital 02/03/19 with generalized malaise, SOB, N/V, RUQ abdominal pain.  Pt admitted with sepsis, elevated LFT's, and PAF.  PMH includes a-fib, htn, h/o arm surgery 2015, h/o knee surgery, h/o shoulder surgery, large hiatal hernia.  Clinical Impression  Prior to hospital admission, pt was independent with functional mobility (h/o "wobbles" for past 2 years but no falls).  Pt lives alone in 1 level home with ramp access.  Currently pt is modified independent semi-supine to sit; independent with transfers; SBA with ambulation 200 feet (no AD); and CGA navigating 4 steps with railing.  Pt's O2 sats decreased to 83% on room air post ambulation (SOB noted) but with sitting rest break and cueing for pursed lip breathing, pt's O2 sats increased to 94% on room air after a couple minutes (nurse and CM notified).  Pt would benefit from skilled PT to address noted impairments and functional limitations (see below for any additional details).  Upon hospital discharge, no PT follow-up anticipated.    Follow Up Recommendations No PT follow up    Equipment Recommendations  None recommended by PT    Recommendations for Other Services       Precautions / Restrictions Precautions Precautions: None Restrictions Weight Bearing Restrictions: No      Mobility  Bed Mobility Overal bed mobility: Modified Independent             General bed mobility comments: Semi-supine to sit without any noted difficulties  Transfers Overall transfer level: Independent Equipment used: None             General transfer comment: steady safe transfers noted  Ambulation/Gait Ambulation/Gait assistance: Supervision Gait Distance (Feet): 200 Feet Assistive device: None   Gait velocity: mildly decreased   General Gait  Details: mild increased B lateral sway (pt reports h/o this for past 2 years) but no loss of balance noted  Stairs Stairs: Yes Stairs assistance: Min guard Stair Management: Two rails;Alternating pattern;Forwards Number of Stairs: 4 General stair comments: mild increased effort to perform but steady  Wheelchair Mobility    Modified Rankin (Stroke Patients Only)       Balance Overall balance assessment: Needs assistance Sitting-balance support: No upper extremity supported;Feet supported Sitting balance-Leahy Scale: Normal Sitting balance - Comments: steady sitting reaching outside BOS   Standing balance support: No upper extremity supported;During functional activity Standing balance-Leahy Scale: Good Standing balance comment: mild increased B lateral sway with ambulation but no loss of balance noted (pt reports h/o "wobbles" but no falling)                             Pertinent Vitals/Pain Pain Assessment: No/denies pain  HR WFL during session's activities.    Home Living Family/patient expects to be discharged to:: Private residence Living Arrangements: Alone   Type of Home: House Home Access: Stairs to enter;Ramped entrance   Entrance Stairs-Number of Steps: can use ramp or usually uses steps (7 steps with B railings) Home Layout: One level   Additional Comments: walking stick    Prior Function Level of Independence: Independent         Comments: occasional use of walking stick to go get mail     Hand Dominance        Extremity/Trunk Assessment   Upper  Extremity Assessment Upper Extremity Assessment: Generalized weakness    Lower Extremity Assessment Lower Extremity Assessment: Generalized weakness    Cervical / Trunk Assessment Cervical / Trunk Assessment: Normal  Communication   Communication: No difficulties  Cognition Arousal/Alertness: Awake/alert Behavior During Therapy: WFL for tasks assessed/performed Overall Cognitive  Status: Within Functional Limits for tasks assessed                                        General Comments   Nursing cleared pt for participation in physical therapy.  Pt agreeable to PT session.    Exercises     Assessment/Plan    PT Assessment Patient needs continued PT services  PT Problem List Decreased activity tolerance;Decreased balance;Decreased mobility;Cardiopulmonary status limiting activity       PT Treatment Interventions Gait training;Stair training;Functional mobility training;Therapeutic activities;Therapeutic exercise;Balance training;Patient/family education    PT Goals (Current goals can be found in the Care Plan section)  Acute Rehab PT Goals Patient Stated Goal: to improve breathing and strength PT Goal Formulation: With patient Time For Goal Achievement: 02/18/19 Potential to Achieve Goals: Good    Frequency Min 2X/week   Barriers to discharge        Co-evaluation               AM-PAC PT "6 Clicks" Mobility  Outcome Measure Help needed turning from your back to your side while in a flat bed without using bedrails?: None Help needed moving from lying on your back to sitting on the side of a flat bed without using bedrails?: None Help needed moving to and from a bed to a chair (including a wheelchair)?: None Help needed standing up from a chair using your arms (e.g., wheelchair or bedside chair)?: None Help needed to walk in hospital room?: A Little Help needed climbing 3-5 steps with a railing? : A Little 6 Click Score: 22    End of Session Equipment Utilized During Treatment: Gait belt Activity Tolerance: Other (comment)(SOB and decreased O2 sats noted with activity) Patient left: in chair;with call bell/phone within reach Nurse Communication: Mobility status;Precautions;Other (comment)(Pt's O2 sats during session) PT Visit Diagnosis: Other abnormalities of gait and mobility (R26.89);Muscle weakness (generalized) (M62.81)     Time: 2956-2130 PT Time Calculation (min) (ACUTE ONLY): 20 min   Charges:   PT Evaluation $PT Eval Low Complexity: 1 Low         Jamarco Zaldivar, PT 02/04/19, 12:16 PM [phone]

## 2019-02-04 NOTE — Progress Notes (Signed)
[facility] [place] NAME: [name]    MR#:  [id number]  DATE OF BIRTH:  1941/04/12  SUBJECTIVE:  patient came in with generalized weakness fever not feeling well along with dark color on her urine. She is having some back pain. She was found to have sepsis due to UTI.  REVIEW OF SYSTEMS:   Review of Systems  Constitutional: Positive for chills and malaise/fatigue. Negative for fever and weight loss.  HENT: Negative for ear discharge, ear pain and nosebleeds.   Eyes: Negative for blurred vision, pain and discharge.  Respiratory: Negative for sputum production, shortness of breath, wheezing and stridor.   Cardiovascular: Negative for chest pain, palpitations, orthopnea and PND.  Gastrointestinal: Negative for abdominal pain, diarrhea, nausea and vomiting.  Genitourinary: Positive for dysuria and urgency. Negative for frequency.  Musculoskeletal: Positive for back pain. Negative for joint pain.  Neurological: Positive for weakness. Negative for sensory change, speech change and focal weakness.  Psychiatric/Behavioral: Negative for depression and hallucinations. The patient is not nervous/anxious.    Tolerating Diet:yes Tolerating PT: pending  DRUG ALLERGIES:   Allergies  Allergen Reactions  . Ace Inhibitors Cough  . Albuterol Other (See Comments)    Unknown reaction  . Iodine Hives  . Minocycline Other (See Comments)    Onset 04/10/2001.  / unknown reaction  . Norvasc [Amlodipine Besylate] Hives  . Penicillins Hives    Did it involve swelling of the face/tongue/throat, SOB, or low BP? Unknown Did it involve sudden or severe rash/hives, skin peeling, or any reaction on the inside of your mouth or nose? Unknown Did you need to seek medical attention at a hospital or doctor's office? Unknown When did it last happen?>36 years ago If all above answers are "NO", may proceed with cephalosporin use.  Marland Kitchen Propoxyphene Hives   Onset 04/10/2001.   . Sulfa Antibiotics Diarrhea and Nausea Only  . Vicodin [Hydrocodone-Acetaminophen] Hives    Hives occur after 5 doses   . Amlodipine Rash  . Betadine [Povidone Iodine] Rash and Other (See Comments)    blisters  . Clarithromycin Rash    VITALS:  Blood pressure (!) 143/70, pulse 84, temperature 98.8 F (37.1 C), temperature source Oral, resp. rate 20, height 5\' 6"  (1.676 m), weight 84.6 kg, SpO2 93 %.  PHYSICAL EXAMINATION:   Physical Exam  GENERAL:  78 y.o.-year-old patient lying in the bed with no acute distress. obese EYES: Pupils equal, round, reactive to light and accommodation. No scleral icterus. Extraocular muscles intact.  HEENT: Head atraumatic, normocephalic. Oropharynx and nasopharynx clear.  NECK:  Supple, no jugular venous distention. No thyroid enlargement, no tenderness.  LUNGS: Normal breath sounds bilaterally, no wheezing, rales, rhonchi. No use of accessory muscles of respiration.  CARDIOVASCULAR: S1, S2 normal. No murmurs, rubs, or gallops.  ABDOMEN: Soft, nontender, nondistended. Bowel sounds present. No organomegaly or mass.  EXTREMITIES: No cyanosis, clubbing or edema b/l.    NEUROLOGIC: Cranial nerves II through XII are intact. No focal Motor or sensory deficits b/l.   PSYCHIATRIC:  patient is alert and oriented x 3.  SKIN: No obvious rash, lesion, or ulcer.   LABORATORY PANEL:  CBC Recent Labs  Lab 02/04/19 0349  WBC 11.4*  HGB 12.8  HCT 39.6  PLT 146*    Chemistries  Recent Labs  Lab 02/04/19 0349  NA 138  K 4.0  CL 110  CO2 23  GLUCOSE 118*  BUN 19  CREATININE 0.81  CALCIUM  8.9  AST 93*  ALT 161*  ALKPHOS 67  BILITOT 2.3*   Cardiac Enzymes No results for input(s): TROPONINI in the last 168 hours. RADIOLOGY:  Ct Abdomen Pelvis Wo Contrast  Result Date: 02/03/2019 CLINICAL DATA:  Abdominal pain with fever diarrhea EXAM: CT ABDOMEN AND PELVIS WITHOUT CONTRAST TECHNIQUE: Multidetector CT imaging of the abdomen  and pelvis was performed following the standard protocol without IV contrast. COMPARISON:  Ultrasound 02/03/2019, CT 05/03/2018 FINDINGS: Lower chest: Lung bases demonstrate bronchiectasis in the lower lobes and mild subpleural scarring and fibrosis. No acute consolidations or pleural effusion. Cardiomegaly. Large hiatal hernia. Hepatobiliary: No focal liver abnormality is seen. Status post cholecystectomy. No biliary dilatation. Pancreas: Atrophic and fatty replaced. No definitive inflammatory change or ductal dilatation Spleen: Accessory splenule.  Upper normal in size. Adrenals/Urinary Tract: Adrenal glands are normal. No hydronephrosis. New left greater than right perinephric fat stranding and fluid. Bladder is unremarkable Stomach/Bowel: Stomach is nondilated. No dilated small bowel. Negative appendix. Diffuse diverticular disease of the colon without acute inflammatory change Vascular/Lymphatic: Scattered aortic atherosclerosis. No aneurysm. No significantly enlarged lymph nodes Reproductive: Status post hysterectomy. No adnexal masses. Other: Negative for free air or free fluid Musculoskeletal: Chronic compression deformity at L5. No acute or suspicious osseous abnormality. IMPRESSION: 1. Interim finding of left greater than right perinephric fat stranding and fluid, nonspecific and suggests medical renal disease, consider pyelonephritis in the appropriate clinical setting. No hydronephrosis or stone disease is visualized. 2. Large hiatal hernia. 3. Diffuse diverticular disease of the colon without acute inflammatory process. 4. Cardiomegaly. Bilateral lower lobe bronchiectasis and mild subpleural fibrosis and scarring Electronically Signed   By: Jasmine Pang M.D.   On: 02/03/2019 16:03   Dg Chest 1 View  Result Date: 02/04/2019 CLINICAL DATA:  Follow-up pneumonia EXAM: CHEST  1 VIEW COMPARISON:  02/03/2019 FINDINGS: Cardiac shadow remains enlarged. Large hiatal hernia is seen. No focal infiltrate or  sizable effusion is noted. The changes of bronchiectasis seen on the recent CT are again noted and stable. No bony abnormality is seen. IMPRESSION: Chronic changes without acute abnormality. Electronically Signed   By: Alcide Clever M.D.   On: 02/04/2019 00:33   Dg Chest Portable 1 View  Result Date: 02/03/2019 CLINICAL DATA:  Acute fever and chills. EXAM: PORTABLE CHEST 1 VIEW COMPARISON:  05/03/2018 radiograph FINDINGS: Cardiomegaly and mild peribronchial thickening again noted. There is no evidence of focal airspace disease, pulmonary edema, suspicious pulmonary nodule/mass, pleural effusion, or pneumothorax. No acute bony abnormalities are identified. No interval change from the prior study. IMPRESSION: Cardiomegaly without evidence of acute cardiopulmonary disease. Electronically Signed   By: Harmon Pier M.D.   On: 02/03/2019 12:54   US Abdomen Limited Ruq  Result Date: 02/03/2019 CLINICAL DATA:  78 year old female with nausea and vomiting. History of cholecystectomy. EXAM: ULTRASOUND ABDOMEN LIMITED RIGHT UPPER QUADRANT COMPARISON:  None. FINDINGS: Gallbladder: Not visualized compatible with cholecystectomy. Common bile duct: Diameter: 10 mm, unchanged from 02/01/2011 CT. No intrahepatic biliary dilatation. Liver: Increased hepatic echogenicity noted. No focal hepatic lesions are present. Portal vein is patent on color Doppler imaging with normal direction of blood flow towards the liver. Other: None. IMPRESSION: 1. No acute abnormality. 2. Hepatic steatosis. Electronically Signed   By: Harmon Pier M.D.   On: 02/03/2019 15:42   ASSESSMENT AND PLAN:  78 year old female with PAF on anticoagulation who presents to the emergency room due to fever and generalized malaise.  1. E. coli  Sepsis: secondary to UTI/pyelonephritis - patient  presents with tachycardia, tachypnea, elevated lactic acid level and fever of 100.2 -UA is unremarkable however CT scanconcerning for pyelonephritis. -LFTs are elevated  and ultrasound shows hepatic steatosis. C-hest x-ray shows no acute abnormality -patient currently on meropenem -Continue IV fluids -blood culture positive for E. coli  2.  Elevated LFTs: could be in the setting of sepsis versus viral syndrome -CT scan results as above no evidence of acute cholecystitis. -Ultrasound abdomen shows hepatic steatosis. Gallbladder absent. -LFTs trending down -while elevated bilirubin training down   3.  PAF: She will continue Eliquis for CVA prevention. Continue metoprolol and flecainide for heart rate control.  4. DVT prophylaxis continue eliquis  5. Generalized weakness. Patient to be emulated by nursing staff -if required consider physical therapy  Patient says she will discuss with her family. There is no need for me to update any family members.  Case discussed with Care Management/Social Worker. Management plans discussed with the patient,and they are in agreement.  CODE STATUS: full  DVT Prophylaxis: eliquis  TOTAL TIME TAKING CARE OF THIS PATIENT: *30* minutes.  >50% time spent on counselling and coordination of care  POSSIBLE D/C IN 1-2  DAYS, DEPENDING ON CLINICAL CONDITION.  Note: This dictation was prepared with Dragon dictation along with smaller phrase technology. Any transcriptional errors that result from this process are unintentional.  Enedina FinnerSona Vegas Fritze M.D on 02/04/2019 at 3:32 PM  Between 7am to 6pm - Pager - [phone]  After 6pm go to [email or web address] - Social research officer, governmentpassword [email or web address]  [facility]  Office  [phone]  CC: Primary care physician; Danella PentonMiller, Mark F, MDPatient ID: [name], female   DOB: 1940/06/28, 78 y.o.   MRN: [id number]

## 2019-02-04 NOTE — Progress Notes (Signed)
Per Dr. [name] okay for RN to give one time order of Toradol 15mg  IV.

## 2019-02-05 ENCOUNTER — Telehealth: Payer: Self-pay | Admitting: Cardiovascular Disease

## 2019-02-05 MED ORDER — HYDRALAZINE HCL 25 MG PO TABS
25.0000 mg | ORAL_TABLET | Freq: Two times a day (BID) | ORAL | Status: DC
  Administered 2019-02-05: 25 mg via ORAL
  Filled 2019-02-05: qty 1

## 2019-02-05 MED ORDER — HYDRALAZINE HCL 50 MG PO TABS
50.0000 mg | ORAL_TABLET | Freq: Three times a day (TID) | ORAL | Status: DC
  Administered 2019-02-05 – 2019-02-06 (×4): 50 mg via ORAL
  Filled 2019-02-05 (×4): qty 1

## 2019-02-05 MED ORDER — HYDRALAZINE HCL 20 MG/ML IJ SOLN
10.0000 mg | Freq: Four times a day (QID) | INTRAMUSCULAR | Status: DC | PRN
  Administered 2019-02-05: 10 mg via INTRAVENOUS
  Filled 2019-02-05 (×2): qty 1

## 2019-02-05 MED ORDER — IRBESARTAN 150 MG PO TABS
150.0000 mg | ORAL_TABLET | Freq: Every day | ORAL | Status: DC
  Administered 2019-02-05 – 2019-02-06 (×2): 150 mg via ORAL
  Filled 2019-02-05 (×2): qty 1

## 2019-02-05 NOTE — Progress Notes (Signed)
Per MD okay for RN to DC IVF.  

## 2019-02-05 NOTE — Progress Notes (Signed)
[facility] [place] [facility]   PATIENT NAME: Ann Pitchernn Beining    MR#:  [id number]  DATE OF BIRTH:  1940/09/08  SUBJECTIVE:  patient came in with generalized weakness fever not feeling well along with dark color on her urine. Had nausea earlier. Able to eat breakfast and lunch. Having some headache which is chronic.  REVIEW OF SYSTEMS:   Review of Systems  Constitutional: Positive for chills and malaise/fatigue. Negative for fever and weight loss.  HENT: Negative for ear discharge, ear pain and nosebleeds.   Eyes: Negative for blurred vision, pain and discharge.  Respiratory: Negative for sputum production, shortness of breath, wheezing and stridor.   Cardiovascular: Negative for chest pain, palpitations, orthopnea and PND.  Gastrointestinal: Negative for abdominal pain, diarrhea, nausea and vomiting.  Genitourinary: Positive for dysuria and urgency. Negative for frequency.  Musculoskeletal: Positive for back pain. Negative for joint pain.  Neurological: Positive for weakness. Negative for sensory change, speech change and focal weakness.  Psychiatric/Behavioral: Negative for depression and hallucinations. The patient is not nervous/anxious.    Tolerating Diet:yes Tolerating PT: no PT needs at home DRUG ALLERGIES:   Allergies  Allergen Reactions  . Ace Inhibitors Cough  . Albuterol Other (See Comments)    Unknown reaction  . Iodine Hives  . Minocycline Other (See Comments)    Onset 04/10/2001.  / unknown reaction  . Norvasc [Amlodipine Besylate] Hives  . Penicillins Hives    Did it involve swelling of the face/tongue/throat, SOB, or low BP? Unknown Did it involve sudden or severe rash/hives, skin peeling, or any reaction on the inside of your mouth or nose? Unknown Did you need to seek medical attention at a hospital or doctor's office? Unknown When did it last happen?>36 years ago If all above answers are "NO", may proceed with cephalosporin  use.  Marland Kitchen. Propoxyphene Hives    Onset 04/10/2001.   . Sulfa Antibiotics Diarrhea and Nausea Only  . Vicodin [Hydrocodone-Acetaminophen] Hives    Hives occur after 5 doses   . Amlodipine Rash  . Betadine [Povidone Iodine] Rash and Other (See Comments)    blisters  . Clarithromycin Rash    VITALS:  Blood pressure (!) 155/67, pulse 79, temperature 98.3 F (36.8 C), temperature source Oral, resp. rate 17, height 5\' 6"  (1.676 m), weight 84.6 kg, SpO2 94 %.  PHYSICAL EXAMINATION:   Physical Exam  GENERAL:  78 y.o.-year-old patient lying in the bed with no acute distress. obese EYES: Pupils equal, round, reactive to light and accommodation. No scleral icterus. Extraocular muscles intact.  HEENT: Head atraumatic, normocephalic. Oropharynx and nasopharynx clear.  NECK:  Supple, no jugular venous distention. No thyroid enlargement, no tenderness.  LUNGS: Normal breath sounds bilaterally, no wheezing, rales, rhonchi. No use of accessory muscles of respiration.  CARDIOVASCULAR: S1, S2 normal. No murmurs, rubs, or gallops.  ABDOMEN: Soft, nontender, nondistended. Bowel sounds present. No organomegaly or mass.  EXTREMITIES: No cyanosis, clubbing or edema b/l.    NEUROLOGIC: Cranial nerves II through XII are intact. No focal Motor or sensory deficits b/l.   PSYCHIATRIC:  patient is alert and oriented x 3.  SKIN: No obvious rash, lesion, or ulcer.   LABORATORY PANEL:  CBC Recent Labs  Lab 02/04/19 0349  WBC 11.4*  HGB 12.8  HCT 39.6  PLT 146*    Chemistries  Recent Labs  Lab 02/04/19 0349  NA 138  K 4.0  CL 110  CO2 23  GLUCOSE 118*  BUN 19  CREATININE 0.81  CALCIUM 8.9  AST 93*  ALT 161*  ALKPHOS 67  BILITOT 2.3*   Cardiac Enzymes No results for input(s): TROPONINI in the last 168 hours. RADIOLOGY:  Ct Abdomen Pelvis Wo Contrast  Result Date: 02/03/2019 CLINICAL DATA:  Abdominal pain with fever diarrhea EXAM: CT ABDOMEN AND PELVIS WITHOUT CONTRAST TECHNIQUE:  Multidetector CT imaging of the abdomen and pelvis was performed following the standard protocol without IV contrast. COMPARISON:  Ultrasound 02/03/2019, CT 05/03/2018 FINDINGS: Lower chest: Lung bases demonstrate bronchiectasis in the lower lobes and mild subpleural scarring and fibrosis. No acute consolidations or pleural effusion. Cardiomegaly. Large hiatal hernia. Hepatobiliary: No focal liver abnormality is seen. Status post cholecystectomy. No biliary dilatation. Pancreas: Atrophic and fatty replaced. No definitive inflammatory change or ductal dilatation Spleen: Accessory splenule.  Upper normal in size. Adrenals/Urinary Tract: Adrenal glands are normal. No hydronephrosis. New left greater than right perinephric fat stranding and fluid. Bladder is unremarkable Stomach/Bowel: Stomach is nondilated. No dilated small bowel. Negative appendix. Diffuse diverticular disease of the colon without acute inflammatory change Vascular/Lymphatic: Scattered aortic atherosclerosis. No aneurysm. No significantly enlarged lymph nodes Reproductive: Status post hysterectomy. No adnexal masses. Other: Negative for free air or free fluid Musculoskeletal: Chronic compression deformity at L5. No acute or suspicious osseous abnormality. IMPRESSION: 1. Interim finding of left greater than right perinephric fat stranding and fluid, nonspecific and suggests medical renal disease, consider pyelonephritis in the appropriate clinical setting. No hydronephrosis or stone disease is visualized. 2. Large hiatal hernia. 3. Diffuse diverticular disease of the colon without acute inflammatory process. 4. Cardiomegaly. Bilateral lower lobe bronchiectasis and mild subpleural fibrosis and scarring Electronically Signed   By: Jasmine Pang M.D.   On: 02/03/2019 16:03   Dg Chest 1 View  Result Date: 02/04/2019 CLINICAL DATA:  Follow-up pneumonia EXAM: CHEST  1 VIEW COMPARISON:  02/03/2019 FINDINGS: Cardiac shadow remains enlarged. Large hiatal  hernia is seen. No focal infiltrate or sizable effusion is noted. The changes of bronchiectasis seen on the recent CT are again noted and stable. No bony abnormality is seen. IMPRESSION: Chronic changes without acute abnormality. Electronically Signed   By: Alcide Clever M.D.   On: 02/04/2019 00:33   Dg Chest Portable 1 View  Result Date: 02/03/2019 CLINICAL DATA:  Acute fever and chills. EXAM: PORTABLE CHEST 1 VIEW COMPARISON:  05/03/2018 radiograph FINDINGS: Cardiomegaly and mild peribronchial thickening again noted. There is no evidence of focal airspace disease, pulmonary edema, suspicious pulmonary nodule/mass, pleural effusion, or pneumothorax. No acute bony abnormalities are identified. No interval change from the prior study. IMPRESSION: Cardiomegaly without evidence of acute cardiopulmonary disease. Electronically Signed   By: Harmon Pier M.D.   On: 02/03/2019 12:54   US Abdomen Limited Ruq  Result Date: 02/03/2019 CLINICAL DATA:  78 year old female with nausea and vomiting. History of cholecystectomy. EXAM: ULTRASOUND ABDOMEN LIMITED RIGHT UPPER QUADRANT COMPARISON:  None. FINDINGS: Gallbladder: Not visualized compatible with cholecystectomy. Common bile duct: Diameter: 10 mm, unchanged from 02/01/2011 CT. No intrahepatic biliary dilatation. Liver: Increased hepatic echogenicity noted. No focal hepatic lesions are present. Portal vein is patent on color Doppler imaging with normal direction of blood flow towards the liver. Other: None. IMPRESSION: 1. No acute abnormality. 2. Hepatic steatosis. Electronically Signed   By: Harmon Pier M.D.   On: 02/03/2019 15:42   ASSESSMENT AND PLAN:  78 year old female with PAF on anticoagulation who presents to the emergency room due to fever and generalized malaise.  1. E. coli  Sepsis: secondary  to UTI/pyelonephritis - patient presents with tachycardia, tachypnea, elevated lactic acid level and fever of 100.2 -UA is unremarkable however CT scanconcerning  for pyelonephritis. -LFTs are elevated and ultrasound shows hepatic steatosis. C-hest x-ray shows no acute abnormality -patient currently on meropenem -received IV fluids-- DC now -blood culture positive for E. Coli-- sensitivity pending  2.  Elevated LFTs: could be in the setting of sepsis versus viral syndrome -CT scan results as above no evidence of acute cholecystitis. -Ultrasound abdomen shows hepatic steatosis. Gallbladder absent. -LFTs trending down, denies any pain  3.  PAF: She will continue Eliquis for CVA prevention. Continue metoprolol and flecainide for heart rate control.  4. DVT prophylaxis continue eliquis  5. Generalized weakness.  -No PT needs  Patient says she has discussed with her family. There is no need for me to update any family members.  Anticipated discharge tomorrow if continues to show improvement. Patient agreeable with plan  Case discussed with Care Management/Social Worker. Management plans discussed with the patient,and they are in agreement.  CODE STATUS: full  DVT Prophylaxis: eliquis  TOTAL TIME TAKING CARE OF THIS PATIENT: *30* minutes.  >50% time spent on counselling and coordination of care  POSSIBLE D/C IN 1-2  DAYS, DEPENDING ON CLINICAL CONDITION.  Note: This dictation was prepared with Dragon dictation along with smaller phrase technology. Any transcriptional errors that result from this process are unintentional.  Fritzi Mandes M.D on 02/05/2019 at 11:53 AM  Between 7am to 6pm - Pager - [phone]  After 6pm go to [email or web address] - password [email or web address] [facility]  Office  [phone]  CC: Primary care physician; Rusty Aus, MDPatient ID: [name], female   DOB: 25-Dec-1940, 78 y.o.   MRN: [id number]

## 2019-02-05 NOTE — Telephone Encounter (Signed)
Spoke with Mr. Corinna Lines and advised that we did receive message regarding her being in the hospital and that I made Dr. Rockey Situ aware. He will pass this information on to her.

## 2019-02-05 NOTE — Telephone Encounter (Signed)
Patient has been admitted to [facility] and wanted her provider to be aware

## 2019-02-05 NOTE — Progress Notes (Signed)
BP persistently elevated , noted to have BP of 195/97 this am. Will restart previously held home BP meds in addition to PRN Hydralazine.   Rufina Falco, DNP, FNP-BC [facility] Nurse Practitioner Between 7am to 6pm - Pager - [phone]  After 6pm go to [email or web address] - Proofreader  [employer]  [phone]

## 2019-02-06 LAB — CULTURE, BLOOD (ROUTINE X 2)
Special Requests: ADEQUATE
Special Requests: ADEQUATE

## 2019-02-06 MED ORDER — IRBESARTAN 150 MG PO TABS: 150.0000 mg | ORAL_TABLET | Freq: Two times a day (BID) | ORAL | Status: DC

## 2019-02-06 MED ORDER — LEVOFLOXACIN 750 MG PO TABS
750.0000 mg | ORAL_TABLET | Freq: Every day | ORAL | Status: DC
  Administered 2019-02-06: 750 mg via ORAL
  Filled 2019-02-06: qty 1

## 2019-02-06 MED ORDER — HYDRALAZINE HCL 25 MG PO TABS: 50.0000 mg | ORAL_TABLET | Freq: Three times a day (TID) | ORAL | 11 refills | Status: DC

## 2019-02-06 MED ORDER — TORSEMIDE 20 MG PO TABS: 30.0000 mg | ORAL_TABLET | Freq: Every day | ORAL | 0 refills | Status: DC | PRN

## 2019-02-06 MED ORDER — LEVOFLOXACIN 750 MG PO TABS: 750.0000 mg | ORAL_TABLET | Freq: Every day | ORAL | 0 refills | Status: AC

## 2019-02-06 MED ORDER — IRBESARTAN 150 MG PO TABS: 150.0000 mg | ORAL_TABLET | Freq: Every day | ORAL | Status: DC

## 2019-02-06 MED ORDER — IRBESARTAN 150 MG PO TABS: 150.0000 mg | ORAL_TABLET | Freq: Every day | ORAL | 1 refills | Status: DC

## 2019-02-06 NOTE — Discharge Summary (Signed)
[facility] [place] [facility]   PATIENT NAME: [name]    MR#:  [id number]  DATE OF BIRTH:  [birth year]  DATE OF ADMISSION:  02/03/2019 ADMITTING PHYSICIAN: Adrian Saran, MD  DATE OF DISCHARGE: 02/06/2019  PRIMARY CARE PHYSICIAN: Danella Penton, MD    ADMISSION DIAGNOSIS:  N&V (nausea and vomiting) [R11.2] Sepsis with acute hypoxic respiratory failure without septic shock, due to unspecified organism (HCC) [A41.9, R65.20, J96.01]  DISCHARGE DIAGNOSIS:  Ecoli Sepsis UTI/Acute pyelonephritis  SECONDARY DIAGNOSIS:   Past Medical History:  Diagnosis Date  . A-fib (HCC) 2012, 2014  . Anxiety   . Arrhythmia    A-fib  . Bursitis    hips  . Concussion   . Concussion July, 28, 2014  . GERD (gastroesophageal reflux disease)   . Hypertension   . Palpitations   . Pneumonia 2012    HOSPITAL COURSE:   78 year old female with PAF on anticoagulation who presents to the emergency room due to fever and generalized malaise.  1. E. coli Sepsis: secondary to UTI/pyelonephritis - patient presents with tachycardia, tachypnea,elevated lactic acid level and fever of 100.2 -UA is unremarkable however CT scanconcerning for pyelonephritis. -LFTs are elevated and ultrasound shows hepatic steatosis. C-hest x-ray shows no acute abnormality -patient currently on meropenem -received IV fluids-- DC now -blood culture positive for E. Coli-- sensitivity noted--changed to po Levaquin (due to her allergies)  2. Elevated LFTs: could be in the setting of sepsis  -CT scan results as above no evidence of acute cholecystitis. -Ultrasound abdomen shows hepatic steatosis. Gallbladder absent. -LFTs trending down, denies any pain  3. PAF: She will continue Eliquis for CVA prevention. Continue metoprolol and flecainide for heart rate control.  4. DVT prophylaxis continue eliquis  5. Generalized weakness.  -No PT needs  6.Uncontrolled HTN -pt reports BP at home  has been on the higher side at home lately -increased hydralazine to 50 mg tid and added Irbesartan 150 mg daily. Pt to keep BP log and f/u Dr Hyacinth Meeker as out pt  Spoke with frined Joyce Gross on phone D/c home CONSULTS OBTAINED:    DRUG ALLERGIES:   Allergies  Allergen Reactions  . Ace Inhibitors Cough  . Albuterol Other (See Comments)    Unknown reaction  . Iodine Hives  . Minocycline Other (See Comments)    Onset 04/10/2001.  / unknown reaction  . Norvasc [Amlodipine Besylate] Hives  . Penicillins Hives    Did it involve swelling of the face/tongue/throat, SOB, or low BP? Unknown Did it involve sudden or severe rash/hives, skin peeling, or any reaction on the inside of your mouth or nose? Unknown Did you need to seek medical attention at a hospital or doctor's office? Unknown When did it last happen?>36 years ago If all above answers are "NO", may proceed with cephalosporin use.  Marland Kitchen Propoxyphene Hives    Onset 04/10/2001.   . Sulfa Antibiotics Diarrhea and Nausea Only  . Vicodin [Hydrocodone-Acetaminophen] Hives    Hives occur after 5 doses   . Amlodipine Rash  . Betadine [Povidone Iodine] Rash and Other (See Comments)    blisters  . Clarithromycin Rash    DISCHARGE MEDICATIONS:   Allergies as of 02/06/2019      Reactions   Ace Inhibitors Cough   Albuterol Other (See Comments)   Unknown reaction   Iodine Hives   Minocycline Other (See Comments)   Onset 04/10/2001.  / unknown reaction   Norvasc [amlodipine Besylate] Hives   Penicillins  Hives   Did it involve swelling of the face/tongue/throat, SOB, or low BP? Unknown Did it involve sudden or severe rash/hives, skin peeling, or any reaction on the inside of your mouth or nose? Unknown Did you need to seek medical attention at a hospital or doctor's office? Unknown When did it last happen?>36 years ago If all above answers are "NO", may proceed with cephalosporin use.   Propoxyphene Hives   Onset 04/10/2001.    Sulfa Antibiotics Diarrhea, Nausea Only   Vicodin [hydrocodone-acetaminophen] Hives   Hives occur after 5 doses   Amlodipine Rash   Betadine [povidone Iodine] Rash, Other (See Comments)   blisters   Clarithromycin Rash      Medication List    STOP taking these medications   potassium chloride 10 MEQ tablet Commonly known as: K-DUR     TAKE these medications   ALPRAZolam 0.25 MG tablet Commonly known as: XANAX Take 0.25 mg by mouth 3 (three) times daily as needed for anxiety. Takes every morning, adds second dose as needed   azelastine 0.1 % nasal spray Commonly known as: ASTELIN Place 2 sprays into both nostrils daily.   baclofen 10 MG tablet Commonly known as: LIORESAL Take 10 mg by mouth 3 (three) times daily as needed for muscle spasms.   beta carotene w/minerals tablet Take 1 tablet by mouth 2 (two) times daily.   Calcium Carb-Cholecalciferol 600-400 MG-UNIT Tabs Take 1 tablet by mouth 2 (two) times daily.   carbamazepine 100 MG chewable tablet Commonly known as: TEGRETOL Chew 100 mg by mouth 2 (two) times daily as needed (trigeminal neuralgia).   colchicine 0.6 MG tablet Take 0.6 mg by mouth 2 (two) times daily as needed (gout flare).   diclofenac sodium 1 % Gel Commonly known as: VOLTAREN Apply 1 application topically 2 (two) times daily as needed (foot pain).   Eliquis 5 MG Tabs tablet Generic drug: apixaban TAKE 1 TABLET(5 MG) BY MOUTH TWICE DAILY   esomeprazole 20 MG packet Commonly known as: NEXIUM Take 20 mg by mouth daily before breakfast.   estradiol 1 MG tablet Commonly known as: ESTRACE Take 1.5 mg by mouth daily.   flecainide 100 MG tablet Commonly known as: TAMBOCOR TAKE 1 TABLET BY MOUTH TWICE DAILY What changed:   how much to take  how to take this  when to take this  additional instructions   FLUoxetine 20 MG capsule Commonly known as: PROZAC Take 20 mg by mouth daily.   fluticasone 50 MCG/ACT nasal spray Commonly known  as: FLONASE Place 2 sprays into both nostrils every evening.   guaiFENesin-dextromethorphan 100-10 MG/5ML syrup Commonly known as: ROBITUSSIN DM Take 5 mLs by mouth every 8 (eight) hours as needed for cough.   hydrALAZINE 25 MG tablet Commonly known as: APRESOLINE Take 2 tablets (50 mg total) by mouth 3 (three) times daily. What changed:   how much to take  when to take this   irbesartan 150 MG tablet Commonly known as: AVAPRO Take 1 tablet (150 mg total) by mouth daily.   levofloxacin 750 MG tablet Commonly known as: LEVAQUIN Take 1 tablet (750 mg total) by mouth daily for 11 doses.   metoprolol tartrate 25 MG tablet Commonly known as: LOPRESSOR Take 0.5 tablets (12.5 mg total) by mouth 2 (two) times daily.   montelukast 10 MG tablet Commonly known as: SINGULAIR TAKE 1 TABLET(10 MG) BY MOUTH AT BEDTIME What changed: See the new instructions.   multivitamin with minerals Tabs tablet Take 1  tablet by mouth daily with supper.   olmesartan 20 MG tablet Commonly known as: BENICAR Take 20 mg by mouth daily.   omeprazole 20 MG tablet Commonly known as: PRILOSEC OTC Take 20 mg by mouth at bedtime.   ondansetron 4 MG tablet Commonly known as: ZOFRAN Take 4 mg by mouth every 6 (six) hours as needed for nausea or vomiting.   oxybutynin 5 MG tablet Commonly known as: DITROPAN Take 5 mg by mouth every evening.   pramipexole 0.5 MG tablet Commonly known as: MIRAPEX Take 0.25 mg by mouth 4 (four) times daily.   pyridoxine 200 MG tablet Commonly known as: B-6 Take 200 mg by mouth daily.   spironolactone 25 MG tablet Commonly known as: ALDACTONE Take 12.5 mg by mouth daily as needed (fluid/edema (take with torsemide)).   SUMAtriptan 50 MG tablet Commonly known as: IMITREX Take 50 mg by mouth every 2 (two) hours as needed for migraine.   tiZANidine 4 MG tablet Commonly known as: ZANAFLEX Take 2 mg by mouth 3 (three) times daily as needed for muscle spasms.    torsemide 20 MG tablet Commonly known as: DEMADEX Take 1.5 tablets (30 mg total) by mouth daily as needed (fluid/edema (take with spironolactone)).   traMADol 50 MG tablet Commonly known as: ULTRAM Take 50 mg by mouth 3 (three) times daily as needed (pain).   traZODone 50 MG tablet Commonly known as: DESYREL Take 25 mg by mouth at bedtime.   triamcinolone lotion 0.1 % Commonly known as: KENALOG Apply 1 application topically 3 (three) times daily as needed (itching/rash).   Ventolin HFA 108 (90 Base) MCG/ACT inhaler Generic drug: albuterol Inhale 1-2 puffs into the lungs every 6 (six) hours as needed for wheezing or shortness of breath.   vitamin B-12 1000 MCG tablet Commonly known as: CYANOCOBALAMIN Take 1,000 mcg by mouth at bedtime.   vitamin C 1000 MG tablet Take 1,000 mg by mouth daily.       If you experience worsening of your admission symptoms, develop shortness of breath, life threatening emergency, suicidal or homicidal thoughts you must seek medical attention immediately by calling 911 or calling your MD immediately  if symptoms less severe.  You Must read complete instructions/literature along with all the possible adverse reactions/side effects for all the Medicines you take and that have been prescribed to you. Take any new Medicines after you have completely understood and accept all the possible adverse reactions/side effects.   Please note  You were cared for by a hospitalist during your hospital stay. If you have any questions about your discharge medications or the care you received while you were in the hospital after you are discharged, you can call the unit and asked to speak with the hospitalist on call if the hospitalist that took care of you is not available. Once you are discharged, your primary care physician will handle any further medical issues. Please note that NO REFILLS for any discharge medications will be authorized once you are discharged, as  it is imperative that you return to your primary care physician (or establish a relationship with a primary care physician if you do not have one) for your aftercare needs so that they can reassess your need for medications and monitor your lab values. Today   SUBJECTIVE  Doing well   VITAL SIGNS:  Blood pressure (!) 178/84, pulse 62, temperature 98.4 F (36.9 C), temperature source Oral, resp. rate 18, height 5\' 6"  (1.676 m), weight 84.6 kg, SpO2  93 %.  I/O:    Intake/Output Summary (Last 24 hours) at 02/06/2019 0927 Last data filed at 02/05/2019 1556 Gross per 24 hour  Intake 385.24 ml  Output 300 ml  Net 85.24 ml    PHYSICAL EXAMINATION:  GENERAL:  78 y.o.-year-old patient lying in the bed with no acute distress.  EYES: Pupils equal, round, reactive to light and accommodation. No scleral icterus. Extraocular muscles intact.  HEENT: Head atraumatic, normocephalic. Oropharynx and nasopharynx clear.  NECK:  Supple, no jugular venous distention. No thyroid enlargement, no tenderness.  LUNGS: Normal breath sounds bilaterally, no wheezing, rales,rhonchi or crepitation. No use of accessory muscles of respiration.  CARDIOVASCULAR: S1, S2 normal. No murmurs, rubs, or gallops.  ABDOMEN: Soft, non-tender, non-distended. Bowel sounds present. No organomegaly or mass.  EXTREMITIES: No pedal edema, cyanosis, or clubbing.  NEUROLOGIC: Cranial nerves II through XII are intact. Muscle strength 5/5 in all extremities. Sensation intact. Gait not checked.  PSYCHIATRIC: The patient is alert and oriented x 3.  SKIN: No obvious rash, lesion, or ulcer.   DATA REVIEW:   CBC  Recent Labs  Lab 02/04/19 0349  WBC 11.4*  HGB 12.8  HCT 39.6  PLT 146*    Chemistries  Recent Labs  Lab 02/04/19 0349  NA 138  K 4.0  CL 110  CO2 23  GLUCOSE 118*  BUN 19  CREATININE 0.81  CALCIUM 8.9  AST 93*  ALT 161*  ALKPHOS 67  BILITOT 2.3*    Microbiology Results   Recent Results (from the past  240 hour(s))  Culture, blood (Routine x 2)     Status: Abnormal   Collection Time: 02/03/19 12:02 PM   Specimen: BLOOD  Result Value Ref Range Status   Specimen Description   Final    BLOOD LAC Performed at [facility], [address], [place], [place] [address]    Special Requests   Final    BOTTLES DRAWN AEROBIC AND ANAEROBIC Blood Culture adequate volume Performed at [facility], [address] [place], [place] [address]    Culture  Setup Time   Final    GRAM NEGATIVE RODS IN BOTH AEROBIC AND ANAEROBIC BOTTLES CRITICAL RESULT CALLED TO, READ BACK BY AND VERIFIED WITH: SHEMMA HALLAJI @2209  02/03/19 MJU    Culture ESCHERICHIA COLI (A)  Final   Report Status 02/06/2019 FINAL  Final   Organism ID, Bacteria ESCHERICHIA COLI  Final      Susceptibility   Escherichia coli - MIC*    AMPICILLIN <=2 SENSITIVE Sensitive     CEFAZOLIN <=4 SENSITIVE Sensitive     CEFEPIME <=1 SENSITIVE Sensitive     CEFTAZIDIME <=1 SENSITIVE Sensitive     CEFTRIAXONE <=1 SENSITIVE Sensitive     CIPROFLOXACIN <=0.25 SENSITIVE Sensitive     GENTAMICIN <=1 SENSITIVE Sensitive     IMIPENEM <=0.25 SENSITIVE Sensitive     TRIMETH/SULFA <=20 SENSITIVE Sensitive     AMPICILLIN/SULBACTAM <=2 SENSITIVE Sensitive     PIP/TAZO <=4 SENSITIVE Sensitive     Extended ESBL NEGATIVE Sensitive     * ESCHERICHIA COLI  Culture, blood (Routine x 2)     Status: Abnormal   Collection Time: 02/03/19 12:02 PM   Specimen: BLOOD  Result Value Ref Range Status   Specimen Description   Final    BLOOD R WRIST Performed at [facility], [address] [place], [place] [address]    Special Requests   Final    BOTTLES DRAWN AEROBIC AND ANAEROBIC  Blood Culture adequate volume Performed at [facility], [address] [place], [place] [address]    Culture  Setup Time   Final    GRAM NEGATIVE RODS IN BOTH AEROBIC AND ANAEROBIC BOTTLES CRITICAL VALUE NOTED.  VALUE IS CONSISTENT WITH  PREVIOUSLY REPORTED AND CALLED VALUE. MJU    Culture (A)  Final    ESCHERICHIA COLI SUSCEPTIBILITIES PERFORMED ON PREVIOUS CULTURE WITHIN THE LAST 5 DAYS. Performed at [facility], [address] [place], [place] [address]    Report Status 02/06/2019 FINAL  Final  Blood Culture ID Panel (Reflexed)     Status: Abnormal   Collection Time: 02/03/19 12:02 PM  Result Value Ref Range Status   Enterococcus species NOT DETECTED NOT DETECTED Final   Listeria monocytogenes NOT DETECTED NOT DETECTED Final   Staphylococcus species NOT DETECTED NOT DETECTED Final   Staphylococcus aureus (BCID) NOT DETECTED NOT DETECTED Final   Streptococcus species NOT DETECTED NOT DETECTED Final   Streptococcus agalactiae NOT DETECTED NOT DETECTED Final   Streptococcus pneumoniae NOT DETECTED NOT DETECTED Final   Streptococcus pyogenes NOT DETECTED NOT DETECTED Final   Acinetobacter baumannii NOT DETECTED NOT DETECTED Final   Enterobacteriaceae species DETECTED (A) NOT DETECTED Final    Comment: Enterobacteriaceae represent a large family of gram-negative bacteria, not a single organism. CRITICAL RESULT CALLED TO, READ BACK BY AND VERIFIED WITH: SHEMMA HALLAJI @2209  02/03/19 MJU    Enterobacter cloacae complex NOT DETECTED NOT DETECTED Final   Escherichia coli DETECTED (A) NOT DETECTED Final    Comment: CRITICAL RESULT CALLED TO, READ BACK BY AND VERIFIED WITH: SHEMMA HALLAJI @2209  02/03/19 MJU    Klebsiella oxytoca NOT DETECTED NOT DETECTED Final   Klebsiella pneumoniae NOT DETECTED NOT DETECTED Final   Proteus species NOT DETECTED NOT DETECTED Final   Serratia marcescens NOT DETECTED NOT DETECTED Final   Carbapenem resistance NOT DETECTED NOT DETECTED Final   Haemophilus influenzae NOT DETECTED NOT DETECTED Final   Neisseria meningitidis NOT DETECTED NOT DETECTED Final   Pseudomonas aeruginosa NOT DETECTED NOT DETECTED Final   Candida albicans NOT DETECTED NOT DETECTED Final   Candida glabrata NOT  DETECTED NOT DETECTED Final   Candida krusei NOT DETECTED NOT DETECTED Final   Candida parapsilosis NOT DETECTED NOT DETECTED Final   Candida tropicalis NOT DETECTED NOT DETECTED Final    Comment: Performed at [facility], [address] [place], [place] [address]  SARS Coronavirus 2 [facility] order, Performed in [facility] hospital lab) Nasopharyngeal Nasopharyngeal Swab     Status: None   Collection Time: 02/03/19  1:12 PM   Specimen: Nasopharyngeal Swab  Result Value Ref Range Status   SARS Coronavirus 2 NEGATIVE NEGATIVE Final    Comment: (NOTE) If result is NEGATIVE SARS-CoV-2 target nucleic acids are NOT DETECTED. The SARS-CoV-2 RNA is generally detectable in upper and lower  respiratory specimens during the acute phase of infection. The lowest  concentration of SARS-CoV-2 viral copies this assay can detect is 250  copies / mL. A negative result does not preclude SARS-CoV-2 infection  and should not be used as the sole basis for treatment or other  patient management decisions.  A negative result may occur with  improper specimen collection / handling, submission of specimen other  than nasopharyngeal swab, presence of viral mutation(s) within the  areas targeted by this assay, and inadequate number of viral copies  (<250 copies / mL). A negative result must be combined with clinical  observations, patient  history, and epidemiological information. If result is POSITIVE SARS-CoV-2 target nucleic acids are DETECTED. The SARS-CoV-2 RNA is generally detectable in upper and lower  respiratory specimens dur ing the acute phase of infection.  Positive  results are indicative of active infection with SARS-CoV-2.  Clinical  correlation with patient history and other diagnostic information is  necessary to determine patient infection status.  Positive results do  not rule out bacterial infection or co-infection with other viruses. If result is PRESUMPTIVE POSTIVE SARS-CoV-2  nucleic acids MAY BE PRESENT.   A presumptive positive result was obtained on the submitted specimen  and confirmed on repeat testing.  While 2019 novel coronavirus  (SARS-CoV-2) nucleic acids may be present in the submitted sample  additional confirmatory testing may be necessary for epidemiological  and / or clinical management purposes  to differentiate between  SARS-CoV-2 and other Sarbecovirus currently known to infect humans.  If clinically indicated additional testing with an alternate test  methodology [id number]) is advised. The SARS-CoV-2 RNA is generally  detectable in upper and lower respiratory sp ecimens during the acute  phase of infection. The expected result is Negative. Fact Sheet for Patients:  StrictlyIdeas.no Fact Sheet for Healthcare Providers: BankingDealers.co.za This test is not yet approved or cleared by the [place] FDA and has been authorized for detection and/or diagnosis of SARS-CoV-2 by FDA under an Emergency Use Authorization (EUA).  This EUA will remain in effect (meaning this test can be used) for the duration of the COVID-19 declaration under Section 564(b)(1) of the Act, 21 U.S.C. section 360bbb-3(b)(1), unless the authorization is terminated or revoked sooner. Performed at [facility], [address], [place], [place] [address]     RADIOLOGY:  No results found.   CODE STATUS:     Code Status Orders  (From admission, onward)         Start     Ordered   02/03/19 1743  Full code  Continuous     02/03/19 1742        Code Status History    This patient has a current code status but no historical code status.   Advance Care Planning Activity    Advance Directive Documentation     Most Recent Value  Type of Advance Directive  Healthcare Power of Attorney  Pre-existing out of facility DNR order (yellow form or pink MOST form)  -  "MOST" Form in Place?  -      TOTAL TIME  TAKING CARE OF THIS PATIENT: *40* minutes.    Fritzi Mandes M.D on 02/06/2019 at 9:27 AM  Between 7am to 6pm - Pager - [phone] After 6pm go to [email or web address] - password [email or web address] [facility]  Office  [phone]  CC: Primary care physician; Rusty Aus, MD

## 2019-02-06 NOTE — Care Management Important Message (Signed)
Important Message  Patient Details  Name: [name] MRN: [id number] Date of Birth: 11-Jan-1941   Medicare Important Message Given:  Yes     Dannette Barbara 02/06/2019, 2:02 PM

## 2019-02-06 NOTE — Progress Notes (Signed)
Ann FellersAnn B [name]  A and O x 4. VSS. Pt tolerating diet well. No complaints of pain or nausea. IV removed intact, prescriptions given. Pt voiced understanding of discharge instructions with no further questions. Pt discharged home.   Allergies as of 02/06/2019      Reactions   Ace Inhibitors Cough   Albuterol Other (See Comments)   Unknown reaction   Iodine Hives   Minocycline Other (See Comments)   Onset 04/10/2001.  / unknown reaction   Norvasc [amlodipine Besylate] Hives   Penicillins Hives   Did it involve swelling of the face/tongue/throat, SOB, or low BP? Unknown Did it involve sudden or severe rash/hives, skin peeling, or any reaction on the inside of your mouth or nose? Unknown Did you need to seek medical attention at a hospital or doctor's office? Unknown When did it last happen?>36 years ago If all above answers are "NO", may proceed with cephalosporin use.   Propoxyphene Hives   Onset 04/10/2001.   Sulfa Antibiotics Diarrhea, Nausea Only   Vicodin [hydrocodone-acetaminophen] Hives   Hives occur after 5 doses   Amlodipine Rash   Betadine [povidone Iodine] Rash, Other (See Comments)   blisters   Clarithromycin Rash      Medication List    STOP taking these medications   potassium chloride 10 MEQ tablet Commonly known as: K-DUR     TAKE these medications   ALPRAZolam 0.25 MG tablet Commonly known as: XANAX Take 0.25 mg by mouth 3 (three) times daily as needed for anxiety. Takes every morning, adds second dose as needed   azelastine 0.1 % nasal spray Commonly known as: ASTELIN Place 2 sprays into both nostrils daily.   baclofen 10 MG tablet Commonly known as: LIORESAL Take 10 mg by mouth 3 (three) times daily as needed for muscle spasms.   beta carotene w/minerals tablet Take 1 tablet by mouth 2 (two) times daily.   Calcium Carb-Cholecalciferol 600-400 MG-UNIT Tabs Take 1 tablet by mouth 2 (two) times daily.   carbamazepine 100 MG chewable tablet Commonly  known as: TEGRETOL Chew 100 mg by mouth 2 (two) times daily as needed (trigeminal neuralgia).   colchicine 0.6 MG tablet Take 0.6 mg by mouth 2 (two) times daily as needed (gout flare).   diclofenac sodium 1 % Gel Commonly known as: VOLTAREN Apply 1 application topically 2 (two) times daily as needed (foot pain).   Eliquis 5 MG Tabs tablet Generic drug: apixaban TAKE 1 TABLET(5 MG) BY MOUTH TWICE DAILY   esomeprazole 20 MG packet Commonly known as: NEXIUM Take 20 mg by mouth daily before breakfast.   estradiol 1 MG tablet Commonly known as: ESTRACE Take 1.5 mg by mouth daily.   flecainide 100 MG tablet Commonly known as: TAMBOCOR TAKE 1 TABLET BY MOUTH TWICE DAILY What changed:   how much to take  how to take this  when to take this  additional instructions   FLUoxetine 20 MG capsule Commonly known as: PROZAC Take 20 mg by mouth daily.   fluticasone 50 MCG/ACT nasal spray Commonly known as: FLONASE Place 2 sprays into both nostrils every evening.   guaiFENesin-dextromethorphan 100-10 MG/5ML syrup Commonly known as: ROBITUSSIN DM Take 5 mLs by mouth every 8 (eight) hours as needed for cough.   hydrALAZINE 25 MG tablet Commonly known as: APRESOLINE Take 2 tablets (50 mg total) by mouth 3 (three) times daily. What changed:   how much to take  when to take this   irbesartan 150 MG tablet  Commonly known as: AVAPRO Take 1 tablet (150 mg total) by mouth daily.   levofloxacin 750 MG tablet Commonly known as: LEVAQUIN Take 1 tablet (750 mg total) by mouth daily for 11 doses.   metoprolol tartrate 25 MG tablet Commonly known as: LOPRESSOR Take 0.5 tablets (12.5 mg total) by mouth 2 (two) times daily.   montelukast 10 MG tablet Commonly known as: SINGULAIR TAKE 1 TABLET(10 MG) BY MOUTH AT BEDTIME What changed: See the new instructions.   multivitamin with minerals Tabs tablet Take 1 tablet by mouth daily with supper.   olmesartan 20 MG tablet Commonly  known as: BENICAR Take 20 mg by mouth daily.   omeprazole 20 MG tablet Commonly known as: PRILOSEC OTC Take 20 mg by mouth at bedtime.   ondansetron 4 MG tablet Commonly known as: ZOFRAN Take 4 mg by mouth every 6 (six) hours as needed for nausea or vomiting.   oxybutynin 5 MG tablet Commonly known as: DITROPAN Take 5 mg by mouth every evening.   pramipexole 0.5 MG tablet Commonly known as: MIRAPEX Take 0.25 mg by mouth 4 (four) times daily.   pyridoxine 200 MG tablet Commonly known as: B-6 Take 200 mg by mouth daily.   spironolactone 25 MG tablet Commonly known as: ALDACTONE Take 12.5 mg by mouth daily as needed (fluid/edema (take with torsemide)).   SUMAtriptan 50 MG tablet Commonly known as: IMITREX Take 50 mg by mouth every 2 (two) hours as needed for migraine.   tiZANidine 4 MG tablet Commonly known as: ZANAFLEX Take 2 mg by mouth 3 (three) times daily as needed for muscle spasms.   torsemide 20 MG tablet Commonly known as: DEMADEX Take 1.5 tablets (30 mg total) by mouth daily as needed (fluid/edema (take with spironolactone)).   traMADol 50 MG tablet Commonly known as: ULTRAM Take 50 mg by mouth 3 (three) times daily as needed (pain).   traZODone 50 MG tablet Commonly known as: DESYREL Take 25 mg by mouth at bedtime.   triamcinolone lotion 0.1 % Commonly known as: KENALOG Apply 1 application topically 3 (three) times daily as needed (itching/rash).   Ventolin HFA 108 (90 Base) MCG/ACT inhaler Generic drug: albuterol Inhale 1-2 puffs into the lungs every 6 (six) hours as needed for wheezing or shortness of breath.   vitamin B-12 1000 MCG tablet Commonly known as: CYANOCOBALAMIN Take 1,000 mcg by mouth at bedtime.   vitamin C 1000 MG tablet Take 1,000 mg by mouth daily.       Vitals:   02/06/19 0426 02/06/19 1233  BP: (!) 178/84 (!) 169/68  Pulse: 62 77  Resp: 18 15  Temp: 98.4 F (36.9 C) 98.3 F (36.8 C)  SpO2: 93% 92%   RN informed Dr.  [name] about Pt's BP per MD pt can still go home. RN gave schedule 50 mg hydralazine to pt before DC.  [name]

## 2019-02-15 ENCOUNTER — Other Ambulatory Visit: Payer: Self-pay | Admitting: Internal Medicine

## 2019-02-15 ENCOUNTER — Other Ambulatory Visit: Payer: Self-pay

## 2019-02-15 ENCOUNTER — Ambulatory Visit
Admission: RE | Admit: 2019-02-15 | Discharge: 2019-02-15 | Disposition: A | Discharge status: Home / Self Care | Payer: Medicare Other | Source: Ambulatory Visit | Attending: Internal Medicine | Admitting: Internal Medicine

## 2019-02-15 DIAGNOSIS — K831 Obstruction of bile duct: Secondary | ICD-10-CM | POA: Diagnosis present

## 2019-03-28 ENCOUNTER — Telehealth: Payer: Self-pay

## 2019-03-28 ENCOUNTER — Other Ambulatory Visit: Payer: Self-pay | Admitting: Cardiovascular Disease

## 2019-03-28 MED ORDER — FLECAINIDE ACETATE 100 MG PO TABS: ORAL_TABLET | ORAL | 0 refills | Status: DC

## 2019-03-28 NOTE — Telephone Encounter (Signed)
Scheduled

## 2019-03-28 NOTE — Telephone Encounter (Signed)
Please schedule F/U with Dr. Gollan. Thank you! 

## 2019-03-28 NOTE — Telephone Encounter (Signed)
Requested Prescriptions   Signed Prescriptions Disp Refills  . flecainide (TAMBOCOR) 100 MG tablet 60 tablet 0    Sig: TAKE 1 TABLET BY MOUTH TWICE DAILY *NEEDS OFFICE VISIT FOR FURTHER REFILLS-PLEASE CALL [phone] TO SCHEDULE.*    Authorizing Provider: Minna Merritts    Ordering User: Raelene Bott, Shaeleigh Graw L

## 2019-04-29 ENCOUNTER — Other Ambulatory Visit: Payer: Self-pay | Admitting: Cardiovascular Disease
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
## 2019-04-30 ENCOUNTER — Other Ambulatory Visit: Payer: Self-pay | Admitting: Cardiovascular Disease

## 2019-04-30 ENCOUNTER — Encounter: Payer: Self-pay | Admitting: Cardiovascular Disease

## 2019-04-30 ENCOUNTER — Other Ambulatory Visit: Payer: Self-pay

## 2019-04-30 ENCOUNTER — Ambulatory Visit (INDEPENDENT_AMBULATORY_CARE_PROVIDER_SITE_OTHER): Payer: Medicare Other | Admitting: Cardiovascular Disease

## 2019-04-30 VITALS — BP 140/60 | HR 74 | Ht 66.0 in | Wt 203.5 lb

## 2019-04-30 DIAGNOSIS — I10 Essential (primary) hypertension: Secondary | ICD-10-CM | POA: Diagnosis not present

## 2019-04-30 DIAGNOSIS — I5033 Acute on chronic diastolic (congestive) heart failure: Secondary | ICD-10-CM | POA: Diagnosis not present

## 2019-04-30 DIAGNOSIS — R002 Palpitations: Secondary | ICD-10-CM | POA: Diagnosis not present

## 2019-04-30 DIAGNOSIS — I480 Paroxysmal atrial fibrillation: Secondary | ICD-10-CM | POA: Diagnosis not present

## 2019-04-30 NOTE — Patient Instructions (Signed)
Medication Instructions:  No changes  If you need a refill on your cardiac medications before your next appointment, please call your pharmacy.    Lab work: No new labs needed   If you have labs (blood work) drawn today and your tests are completely normal, you will receive your results only by: Marland Kitchen MyChart Message (if you have MyChart) OR . A paper copy in the mail If you have any lab test that is abnormal or we need to change your treatment, we will call you to review the results.   Testing/Procedures: No new testing needed   Follow-Up: At [facility], you and your health needs are our priority.  As part of our continuing mission to provide you with exceptional heart care, we have created designated Provider Care Teams.  These Care Teams include your primary Cardiologist (physician) and Advanced Practice Providers (APPs -  Physician Assistants and Nurse Practitioners) who all work together to provide you with the care you need, when you need it.  . You will need a follow up appointment in 7 months .   Please call our office 2 months in advance to schedule this appointment.    . Providers on your designated Care Team:   . Murray Hodgkins, NP . Christell Faith, PA-C . Marrianne Mood, PA-C  Any Other Special Instructions Will Be Listed Below (If Applicable).  For educational health videos Log in to : [email or web address] Or : [email or web address], password : [email or web address]

## 2019-05-06 ENCOUNTER — Telehealth: Payer: Self-pay | Admitting: Cardiovascular Disease

## 2019-05-06 MED ORDER — FLECAINIDE ACETATE 100 MG PO TABS: ORAL_TABLET | ORAL | 3 refills | Status: DC

## 2019-05-06 NOTE — Telephone Encounter (Signed)
*  STAT* If patient is at the pharmacy, call can be transferred to refill team.   1. Which medications need to be refilled? (please list name of each medication and dose if known) flecianide 100 mg bid  2. Which pharmacy/location (including street and city if local pharmacy) is medication to be sent to? [facility] in [place]  3. Do they need a 30 day or 90 day supply? 90  Patient is on last day of medication

## 2019-05-06 NOTE — Telephone Encounter (Signed)
Refill sent in to pharmacy 

## 2019-05-20 ENCOUNTER — Other Ambulatory Visit: Payer: Self-pay | Admitting: Internal Medicine

## 2019-05-20 ENCOUNTER — Other Ambulatory Visit: Payer: Self-pay

## 2019-05-20 ENCOUNTER — Ambulatory Visit
Admission: RE | Admit: 2019-05-20 | Discharge: 2019-05-20 | Disposition: A | Discharge status: Home / Self Care | Payer: Medicare Other | Source: Ambulatory Visit | Attending: Internal Medicine | Admitting: Internal Medicine

## 2019-05-20 DIAGNOSIS — I890 Lymphedema, not elsewhere classified: Secondary | ICD-10-CM

## 2019-06-07 ENCOUNTER — Other Ambulatory Visit
Admission: RE | Admit: 2019-06-07 | Discharge: 2019-06-07 | Disposition: A | Discharge status: Home / Self Care | Payer: Medicare Other | Source: Ambulatory Visit | Attending: Physician Assistant | Admitting: Physician Assistant

## 2019-06-07 ENCOUNTER — Other Ambulatory Visit: Payer: Self-pay

## 2019-06-07 ENCOUNTER — Encounter: Payer: Medicare Other | Attending: Physician Assistant | Admitting: Physician Assistant

## 2019-06-07 DIAGNOSIS — L089 Local infection of the skin and subcutaneous tissue, unspecified: Secondary | ICD-10-CM | POA: Insufficient documentation

## 2019-06-07 NOTE — Progress Notes (Signed)
Hewitt, Adelee B. ([id number]) Visit Report for 06/07/2019 Allergy List Details Patient Name: [name], [name] Date of Service: 06/07/2019 9:15 AM Medical Record Number: [id number] Patient Account Number: [id number] Date of Birth/Sex: September 04, 1940 (79 y.o. F) Treating RN: Rodell Perna Primary Care Phiona Ramnauth: [name] Other Clinician: Referring Thong Feeny: [name] Treating Shandie Bertz/Extender: [name], [name] Weeks in Treatment: 0 Allergies Active Allergies penicillin amlodipine Vicodin Betadine Sulfa (Sulfonamide Antibiotics) Allergy Notes Electronic Signature(s) Signed: 06/07/2019 12:14:17 PM By: [name] Entered By: [name] on 06/07/2019 09:53:47 [name], [name] ([id number]) -------------------------------------------------------------------------------- Arrival Information Details Patient Name: [name], [name] Date of Service: 06/07/2019 9:15 AM Medical Record Number: [id number] Patient Account Number: [id number] Date of Birth/Sex: July 08, 1940 (79 y.o. F) Treating RN: Rodell Perna Primary Care Ahmeer Tuman: [name] Other Clinician: Referring Ahonesty Woodfin: [name] Treating Diaz Crago/Extender: [name], [name] Weeks in Treatment: 0 Visit Information Patient Arrived: Ambulatory Arrival Time: 09:21 Accompanied By: family Transfer Assistance: None Patient Identification Verified: Yes Electronic Signature(s) Signed: 06/07/2019 11:38:50 AM By: Rodell Perna Entered By: Rodell Perna on 06/07/2019 09:22:54 Zachman, Kimmora B. ([id number]) -------------------------------------------------------------------------------- Clinic Level of Care Assessment Details Patient Name: [name], [name] Date of Service: 06/07/2019 9:15 AM Medical Record Number: [id number] Patient Account Number: [id number] Date of Birth/Sex: 1941/01/06 (79 y.o. F) Treating RN: [name] Primary Care Kellon Chalk: [name] Other Clinician: Referring Hayward Rylander: [name] Treating Kensley Lares/Extender: [name],  [name] Weeks in Treatment: 0 Clinic Level of Care Assessment Items TOOL 2 Quantity Score []  - Use when only an EandM is performed on the INITIAL visit 0 ASSESSMENTS - Nursing Assessment / Reassessment X - General Physical Exam (combine w/ comprehensive assessment (listed just below) when 1 20 performed on new pt. evals) X- 1 25 Comprehensive Assessment (HX, ROS, Risk Assessments, Wounds Hx, etc.) ASSESSMENTS - Wound and Skin Assessment / Reassessment X - Simple Wound Assessment / Reassessment - one wound 1 5 []  - 0 Complex Wound Assessment / Reassessment - multiple wounds []  - 0 Dermatologic / Skin Assessment (not related to wound area) ASSESSMENTS - Ostomy and/or Continence Assessment and Care []  - Incontinence Assessment and Management 0 []  - 0 Ostomy Care Assessment and Management (repouching, etc.) PROCESS - Coordination of Care X - Simple Patient / Family Education for ongoing care 1 15 []  - 0 Complex (extensive) Patient / Family Education for ongoing care X- 1 10 Staff obtains , Records, Test Results / Process Orders []  - 0 Staff telephones HHA, Nursing Homes / Clarify orders / etc []  - 0 Routine Transfer to another Facility (non-emergent condition) []  - 0 Routine Hospital Admission (non-emergent condition) X- 1 15 New Admissions / / Ordering NPWT, Apligraf, etc. []  - 0 Emergency Hospital Admission (emergent condition) X- 1 10 Simple Discharge Coordination []  - 0 Complex (extensive) Discharge Coordination PROCESS - Special Needs []  - Pediatric / Minor Patient Management 0 []  - 0 Isolation Patient Management Peavy, Elliyah B. ( ) []  - 0 Hearing / Language / Visual special needs []  - 0 Assessment of Community assistance (transportation, D/C planning, etc.) []  - 0 Additional assistance / Altered mentation []  - 0 Support Surface(s) Assessment (bed, cushion, seat, etc.) INTERVENTIONS - Wound Cleansing / Measurement X - Wound  Imaging (photographs - any number of wounds) 1 5 []  - 0 Wound Tracing (instead of photographs) X- 1 5 Simple Wound Measurement - one wound []  - 0 Complex Wound Measurement - multiple wounds X- 1 5 Simple Wound Cleansing - one wound []  - 0 Complex Wound Cleansing -  multiple wounds INTERVENTIONS - Wound Dressings []  - Small Wound Dressing one or multiple wounds 0 X- 1 15 Medium Wound Dressing one or multiple wounds []  - 0 Large Wound Dressing one or multiple wounds []  - 0 Application of Medications - injection INTERVENTIONS - Miscellaneous []  - External ear exam 0 X- 1 5 Specimen Collection (cultures, biopsies, blood, body fluids, etc.) X- 1 5 Specimen(s) / Culture(s) sent or taken to Lab for analysis []  - 0 Patient Transfer (multiple staff / Civil Service fast streamer / Similar devices) []  - 0 Simple Staple / Suture removal (25 or less) []  - 0 Complex Staple / Suture removal (26 or more) []  - 0 Hypo / Hyperglycemic Management (close monitor of Blood Glucose) []  - 0 Ankle / Brachial Index (ABI) - do not check if billed separately Has the patient been seen at the hospital within the last three years: Yes Total Score: 140 Level Of Care: New/Established - Level 4 Electronic Signature(s) Signed: 06/07/2019 12:14:17 PM By: Montey Hora Entered By: Montey Hora on 06/07/2019 10:04:15 [name], [name] ([id number]) -------------------------------------------------------------------------------- Encounter Discharge Information Details Patient Name: [name], [name] Date of Service: 06/07/2019 9:15 AM Medical Record Number: [id number] Patient Account Number: [id number] Date of Birth/Sex: 1940/12/24 (79 y.o. F) Treating RN: Montey Hora Primary Care Quention Mcneill: Emily Filbert Other Clinician: Referring Tuwanda Vokes: Emily Filbert Treating Ruthe Roemer/Extender: Melburn Hake, [name] Weeks in Treatment: 0 Encounter Discharge Information Items Post Procedure Vitals Discharge Condition: Stable Temperature (F):  98.5 Ambulatory Status: Ambulatory Pulse (bpm): 68 Discharge Destination: Home Respiratory Rate (breaths/min): 16 Transportation: Private Auto Blood Pressure (mmHg): 122/72 Accompanied By: caregiver Schedule Follow-up Appointment: Yes Clinical Summary of Care: Electronic Signature(s) Signed: 06/07/2019 12:14:17 PM By: Montey Hora Entered By: Montey Hora on 06/07/2019 10:09:40 [name], [name] ([id number]) -------------------------------------------------------------------------------- Lower Extremity Assessment Details Patient Name: [name], [name] Date of Service: 06/07/2019 9:15 AM Medical Record Number: [id number] Patient Account Number: [id number] Date of Birth/Sex: 12-Sep-1940 (79 y.o. F) Treating RN: Army Melia Primary Care Adlean Hardeman: Emily Filbert Other Clinician: Referring Bernyce Brimley: Emily Filbert Treating Antoino Westhoff/Extender: Melburn Hake, [name] Weeks in Treatment: 0 Edema Assessment Assessed: [Left: No] [Right: No] Edema: [Left: Yes] [Right: Yes] Calf Left: Right: Point of Measurement: 30 cm From Medial Instep 40 cm 30 cm Ankle Left: Right: Point of Measurement: 11 cm From Medial Instep 25 cm 24.2 cm Vascular Assessment Pulses: Dorsalis Pedis Palpable: [Left:Yes Yes] [Right:Yes] Notes Unable to obtain ABI in left leg d/t wound location and pain Unable to obtain ABI in right leg d/t pt stating it hurt Electronic Signature(s) Signed: 06/07/2019 11:38:50 AM By: Army Melia Entered By: Army Melia on 06/07/2019 09:39:25 [name], [name] ([id number]) -------------------------------------------------------------------------------- Multi Wound Chart Details Patient Name: [name], [name] Date of Service: 06/07/2019 9:15 AM Medical Record Number: [id number] Patient Account Number: [id number] Date of Birth/Sex: 10/05/1940 (79 y.o. F) Treating RN: Montey Hora Primary Care Fatimata Talsma: Emily Filbert Other Clinician: Referring Vara Mairena: Emily Filbert Treating Brogan Martis/Extender: Melburn Hake,  [name] Weeks in Treatment: 0 Vital Signs Height(in): 66 Pulse(bpm): 69 Weight(lbs): 201 Blood Pressure(mmHg): 122/72 Body Mass Index(BMI): 32 Temperature(F): 98.5 Respiratory Rate 16 (breaths/min): Photos: [N/A:N/A] Wound Location: Left Lower Leg - Posterior N/A N/A Wounding Event: Trauma N/A N/A Primary Etiology: Trauma, Other N/A N/A Comorbid History: Cataracts, Arrhythmia, N/A N/A Congestive Heart Failure, Hypertension, Gout Date Acquired: 04/23/2019 N/A N/A Weeks of Treatment: 0 N/A N/A Wound Status: Open N/A N/A Measurements L x W x D 4.1x4.5x0.1 N/A N/A (cm) Area (cm) : 14.491 N/A N/A Volume (cm) : 1.449  N/A N/A % Reduction in Area: 0.00% N/A N/A % Reduction in Volume: 0.00% N/A N/A Classification: Unclassifiable N/A N/A Exudate Amount: Medium N/A N/A Exudate Type: Serosanguineous N/A N/A Exudate Color: red, brown N/A N/A Wound Margin: Flat and Intact N/A N/A [name], [name] ([id number]) Granulation Amount: None Present (0%) N/A N/A Necrotic Amount: Large (67-100%) N/A N/A Necrotic Tissue: Eschar, Adherent Slough N/A N/A Exposed Structures: Fat Layer (Subcutaneous N/A N/A Tissue) Exposed: Yes Fascia: No Tendon: No Muscle: No Joint: No Bone: No Epithelialization: None N/A N/A Treatment Notes Electronic Signature(s) Signed: 06/07/2019 12:14:17 PM By: [name] Entered By: [name] on 06/07/2019 09:55:26 [name], [name] ([id number]) -------------------------------------------------------------------------------- Multi-Disciplinary Care Plan Details Patient Name: [name], [name] Date of Service: 06/07/2019 9:15 AM Medical Record Number: [id number] Patient Account Number: [id number] Date of Birth/Sex: 1941/05/12 (79 y.o. F) Treating RN: [name] Primary Care Nezar Buckles: [name] Other Clinician: Referring Damarri Rampy: [name] Treating Doral Digangi/Extender: [name], [name] Weeks in Treatment: 0 Active Inactive Abuse / Safety / Falls / Self Care  Management Nursing Diagnoses: Potential for falls Goals: Patient will remain injury free related to falls Date Initiated: 06/07/2019 Target Resolution Date: 08/31/2019 Goal Status: Active Interventions: Assess fall risk on admission and as needed Notes: Necrotic Tissue Nursing Diagnoses: Impaired tissue integrity related to necrotic/devitalized tissue Goals: Necrotic/devitalized tissue will be minimized in the wound bed Date Initiated: 06/07/2019 Target Resolution Date: 08/31/2019 Goal Status: Active Interventions: Provide education on necrotic tissue and debridement process Notes: Orientation to the Wound Care Program Nursing Diagnoses: Knowledge deficit related to the wound healing center program Goals: Patient/caregiver will verbalize understanding of the Wound Healing Center Program Date Initiated: 06/07/2019 Target Resolution Date: 08/31/2019 Goal Status: Active Interventions: Provide education on orientation to the wound center [name], Trinidi B. ([id number]) Notes: Pain, Acute or Chronic Nursing Diagnoses: Pain, acute or chronic: actual or potential Goals: Patient/caregiver will verbalize adequate pain control between visits Date Initiated: 06/07/2019 Target Resolution Date: 08/31/2019 Goal Status: Active Interventions: Complete pain assessment as per visit requirements Notes: Wound/Skin Impairment Nursing Diagnoses: Impaired tissue integrity Goals: Ulcer/skin breakdown will heal within 14 weeks Date Initiated: 06/07/2019 Target Resolution Date: 08/31/2019 Goal Status: Active Interventions: Assess patient/caregiver ability to obtain necessary supplies Assess patient/caregiver ability to perform ulcer/skin care regimen upon admission and as needed Assess ulceration(s) every visit Notes: Electronic Signature(s) Signed: 06/07/2019 12:14:17 PM By: [name] Entered By: [name] on 06/07/2019 09:55:14 Hildebran, Delayne B.  ([id number]) -------------------------------------------------------------------------------- Pain Assessment Details Patient Name: Dacey, Kristeen B. Date of Service: 06/07/2019 9:15 AM Medical Record Number: [id number] Patient Account Number: [id number] Date of Birth/Sex: November 02, 1940 (79 y.o. F) Treating RN: Rodell Perna Primary Care Rajohn Henery: [name] Other Clinician: Referring Kanae Ignatowski: [name] Treating Hernan Turnage/Extender: [name], [name] Weeks in Treatment: 0 Active Problems Location of Pain Severity and Description of Pain Patient Has Paino Yes Site Locations Pain Location: Pain in Ulcers Rate the pain. Current Pain Level: 10 Pain Management and Medication Current Pain Management: Electronic Signature(s) Signed: 06/07/2019 11:38:50 AM By: Rodell Perna Entered By: Rodell Perna on 06/07/2019 09:24:30 Alicia, Santanna B. ([id number]) -------------------------------------------------------------------------------- Patient/Caregiver Education Details Patient Name: Nave, Braylei B. Date of Service: 06/07/2019 9:15 AM Medical Record Number: [id number] Patient Account Number: [id number] Date of Birth/Gender: Apr 30, 1941 (79 y.o. F) Treating RN: [name] Primary Care Physician: [name] Other Clinician: Referring Physician: [name] Treating Physician/Extender: [name], [name] Weeks in Treatment: 0 Education Assessment Education Provided To: Patient and Caregiver Education Topics Provided Wound/Skin Impairment: Handouts: Other: wound care  as ordered Methods: Demonstration, Explain/Verbal Responses: State content correctly Electronic Signature(s) Signed: 06/07/2019 12:14:17 PM By: [name] Entered By: [name] on 06/07/2019 10:08:42 Hopes, Delaila B. ([id number]) -------------------------------------------------------------------------------- Wound Assessment Details Patient Name: Bole, Tameyah B. Date of Service: 06/07/2019 9:15 AM Medical Record Number:  [id number] Patient Account Number: [id number] Date of Birth/Sex: 07-19-1940 (79 y.o. F) Treating RN: Rodell Perna Primary Care Mollee Neer: [name] Other Clinician: Referring Ashten Sarnowski: [name] Treating Whitley Strycharz/Extender: [name], [name] Weeks in Treatment: 0 Wound Status Wound Number: 1 Primary Trauma, Other Etiology: Wound Location: Left Lower Leg - Posterior Wound Open Wounding Event: Trauma Status: Date Acquired: 04/23/2019 Comorbid Cataracts, Arrhythmia, Congestive Heart Weeks Of Treatment: 0 History: Failure, Hypertension, Gout Clustered Wound: No Photos Wound Measurements Length: (cm) 4.1 % Reduction Width: (cm) 4.5 % Reduction Depth: (cm) 0.1 Epitheliali Area: (cm) 14.491 Tunneling: Volume: (cm) 1.449 Underminin in Area: 0% in Volume: 0% zation: None No g: No Wound Description Classification: Unclassifiable Foul Odor Wound Margin: Flat and Intact Slough/Fib Exudate Amount: Medium Exudate Type: Serosanguineous Exudate Color: red, brown After Cleansing: No rino Yes Wound Bed Granulation Amount: None Present (0%) Exposed Structure Necrotic Amount: Large (67-100%) Fascia Exposed: No Necrotic Quality: Eschar, Adherent Slough Fat Layer (Subcutaneous Tissue) Exposed: Yes Tendon Exposed: No Muscle Exposed: No Joint Exposed: No Bone Exposed: No Treatment Notes Routson, Andreina B. ([id number]) Wound #1 (Left, Posterior Lower Leg) Notes silvercel, xtrasorb, abd and conform with tubi F Electronic Signature(s) Signed: 06/07/2019 11:38:50 AM By: Rodell Perna Entered By: Rodell Perna on 06/07/2019 09:37:37 Wiederhold, Heddy B. ([id number]) -------------------------------------------------------------------------------- Vitals Details Patient Name: Ambriz, Earla B. Date of Service: 06/07/2019 9:15 AM Medical Record Number: [id number] Patient Account Number: [id number] Date of Birth/Sex: [birth year] (79 y.o. F) Treating RN: Rodell Perna Primary Care Yordan Martindale: [name] Other  Clinician: Referring Breon Diss: [name] Treating Aryav Wimberly/Extender: [name], [name] Weeks in Treatment: 0 Vital Signs Time Taken: 09:24 Temperature (F): 98.5 Height (in): 66 Pulse (bpm): 68 Source: Stated Respiratory Rate (breaths/min): 16 Weight (lbs): 201 Blood Pressure (mmHg): 122/72 Source: Stated Reference Range: 80 - 120 mg / dl Body Mass Index (BMI): 32.4 Electronic Signature(s) Signed: 06/07/2019 11:38:50 AM By: Rodell Perna Entered By: Rodell Perna on 06/07/2019 09:25:32

## 2019-06-07 NOTE — Progress Notes (Addendum)
[name], [name] ([id number]) Visit Report for 06/07/2019 Chief Complaint Document Details Patient Name: [name], [name] Date of Service: 06/07/2019 9:15 AM Medical Record Number: [id number] Patient Account Number: [id number] Date of Birth/Sex: September 18, 1940 (78 y.o. F) Treating RN: Montey Hora Primary Care Provider: Emily Filbert Other Clinician: Referring Provider: Emily Filbert Treating Provider/Extender: Melburn Hake, Gabriell Daigneault Weeks in Treatment: 0 Information Obtained from: Patient Chief Complaint Left posterior LE ulcer Electronic Signature(s) Signed: 06/07/2019 9:50:53 AM By: Worthy Keeler PA-C Entered By: Worthy Keeler on 06/07/2019 09:50:53 [place], [place]. ([id number]) -------------------------------------------------------------------------------- Debridement Details Patient Name: [name], [name] Date of Service: 06/07/2019 9:15 AM Medical Record Number: [id number] Patient Account Number: [id number] Date of Birth/Sex: 1941-03-10 (78 y.o. F) Treating RN: Montey Hora Primary Care Provider: Emily Filbert Other Clinician: Referring Provider: Emily Filbert Treating Provider/Extender: Melburn Hake, Karthikeya Funke Weeks in Treatment: 0 Debridement Performed for Wound #1 Left,Posterior Lower Leg Assessment: Performed By: Physician STONE III, Tawania Daponte E., PA-C Debridement Type: Chemical/Enzymatic/Mechanical Agent Used: Saline Level of Consciousness (Pre- Awake and Alert procedure): Pre-procedure Verification/Time Yes - 10:02 Out Taken: Start Time: 10:02 Pain Control: Lidocaine 4% Topical Solution Instrument: Other : saline and gauze Bleeding: None End Time: 10:03 Procedural Pain: 0 Post Procedural Pain: 0 Response to Treatment: Procedure was tolerated well Level of Consciousness Awake and Alert (Post-procedure): Post Debridement Measurements of Total Wound Length: (cm) 4.1 Width: (cm) 4.5 Depth: (cm) 0.1 Volume: (cm) 1.449 Character of Wound/Ulcer Post Debridement: Improved Post Procedure  Diagnosis Same as Pre-procedure Electronic Signature(s) Signed: 06/07/2019 12:14:17 PM By: Montey Hora Signed: 06/07/2019 12:29:09 PM By: Worthy Keeler PA-C Entered By: Montey Hora on 06/07/2019 10:03:42 Sten[place], Silvina B. ([id number]) -------------------------------------------------------------------------------- HPI Details Patient Name: [name], [name] Date of Service: 06/07/2019 9:15 AM Medical Record Number: [id number] Patient Account Number: [id number] Date of Birth/Sex: 1941-04-26 (78 y.o. F) Treating RN: Montey Hora Primary Care Provider: Emily Filbert Other Clinician: Referring Provider: Emily Filbert Treating Provider/Extender: Melburn Hake, Jerelle Virden Weeks in Treatment: 0 History of Present Illness HPI Description: 06/07/2019 upon evaluation today patient presents today for initial evaluation in our clinic concerning issues she is having with her left lower extremity. She tells me that she hit her leg on a rocking chair when she was getting up and this was roughly at the beginning of December. Dr. Sabra Heck who is her primary care provider has been helping with treating this area since that time. The patient states unfortunately this just is not getting any better. He is attempted salt water soaks as well as using antibiotic ointments topically unfortunately she has not noted much improvement. He wanted her to come to the wound center sooner but she declined although when things did not seem to be getting better she finally consented to this. Currently the patient does appear to have cellulitis of left lower extremity she also has atrial fibrillation noted for which she is on Eliquis this is probably part of the reason that she developed a large hematoma that she did. Subsequently she also has hypertension as well as congestive heart failure. The patient does not appear to be febrile today and really is doing fairly well as far as that is concerned although the leg is warm to touch pretty  much throughout. No fevers, chills, nausea, vomiting, or diarrhea. She has not been on any oral antibiotics. Electronic Signature(s) Signed: 06/07/2019 10:25:11 AM By: Worthy Keeler PA-C Entered By: Worthy Keeler on 06/07/2019 10:25:11 [place], Alden B. ([id number]) -------------------------------------------------------------------------------- Physical Exam Details Patient Name: [name],  [name] Date of Service: 06/07/2019 9:15 AM Medical Record Number: [id number] Patient Account Number: [id number] Date of Birth/Sex: 08-22-1940 (78 y.o. F) Treating RN: [name] Primary Care Provider: [name] Other Clinician: Referring Provider: [name] Treating Provider/Extender: [name], Marialena Wollen Weeks in Treatment: 0 Constitutional sitting or standing blood pressure is within target range for patient.. pulse regular and within target range for patient.Marland Kitchen respirations regular, non-labored and within target range for patient.Marland Kitchen temperature within target range for patient.. Well- nourished and well-hydrated in no acute distress. Eyes conjunctiva clear no eyelid edema noted. pupils equal round and reactive to light and accommodation. Ears, Nose, Mouth, and Throat no gross abnormality of ear auricles or external auditory canals. normal hearing noted during conversation. mucus membranes moist. Respiratory normal breathing without difficulty. Cardiovascular 2+ dorsalis pedis/posterior tibialis pulses. no clubbing, cyanosis, significant edema, <3 sec cap refill. Gastrointestinal (GI) soft, non-tender, non-distended, +BS. no ventral hernia noted. Musculoskeletal normal gait and posture. no significant deformity or arthritic changes, no loss or range of motion, no clubbing. Psychiatric this patient is able to make decisions and demonstrates good insight into disease process. Alert and Oriented x 3. pleasant and cooperative. Notes Upon inspection patient's wound bed actually showed signs of  necrotic tissue on the surface of the wound. There appears to be cellulitis pretty much over the entire region between the ankle and the knee of the left lower extremity which is concerning to me as well. Fortunately there is no evidence of systemic infection which is good news. Overall I am unable to obtain ABIs today although that something that we really need to know as far as her arterial flow to ensure that she is progressing well and is able to heal this area effectively. We will likely need to make a referral to vascular to further check this out. Electronic Signature(s) Signed: 06/07/2019 10:26:12 AM By: Lenda Kelp PA-C Entered By: Lenda Kelp on 06/07/2019 10:26:12 [place], Makeshia B. ([id number]) -------------------------------------------------------------------------------- Physician Orders Details Patient Name: Rajkumar, Emerie B. Date of Service: 06/07/2019 9:15 AM Medical Record Number: [id number] Patient Account Number: [id number] Date of Birth/Sex: 1940-06-08 (78 y.o. F) Treating RN: [name] Primary Care Provider: [name] Other Clinician: Referring Provider: [name] Treating Provider/Extender: [name], Hetal Proano Weeks in Treatment: 0 Verbal / Phone Orders: No Diagnosis Coding ICD-10 Coding Code Description [phone] Unspecified open wound, left lower leg, initial encounter L97.828 Non-pressure chronic ulcer of other part of left lower leg with other specified severity L03.116 Cellulitis of left lower limb I48.0 Paroxysmal atrial fibrillation I10 Essential (primary) hypertension I50.42 Chronic combined systolic (congestive) and diastolic (congestive) heart failure Z79.01 Long term (current) use of anticoagulants Wound Cleansing Wound #1 Left,Posterior Lower Leg o Clean wound with Normal Saline. o May shower with protection. Anesthetic (add to Medication List) Wound #1 Left,Posterior Lower Leg o Topical Lidocaine 4% cream applied to wound bed prior to  debridement (In Clinic Only). Primary Wound Dressing Wound #1 Left,Posterior Lower Leg o Silver Alginate Secondary Dressing Wound #1 Left,Posterior Lower Leg o ABD pad o Conform/Kerlix o XtraSorb Dressing Change Frequency Wound #1 Left,Posterior Lower Leg o Change dressing every other day. - daily if needed for drainage Follow-up Appointments Wound #1 Left,Posterior Lower Leg o Return Appointment in 1 week. Edema Control Wound #1 Left,Posterior Lower Leg o Elevate legs to the level of the heart and pump ankles as often as possible Ice, Shykeria B. ([id number]) o Other: - [facility] o Bacteria identified in Wound by Culture (MICRO)  oooo LOINC Code: [id number] oooo Convenience Name: Wound culture routine Services and Therapies o Arterial Studies- Bilateral Patient Medications Allergies: penicillin, amlodipine, Vicodin, Betadine, Sulfa (Sulfonamide Antibiotics) Notifications Medication Indication Start End doxycycline hyclate 06/07/2019 DOSE 1 - oral 100 mg capsule - 1 capsule oral taken 2 times a day for 14 days. Do not take vitamins, calcium, or antacids with this medication Electronic Signature(s) Signed: 06/07/2019 10:27:58 AM By: Lenda KelpStone III, Ras Kollman PA-C Entered By: Lenda KelpStone III, Holton Sidman on 06/07/2019 10:27:57 [place], Lisel B. ([id number]) -------------------------------------------------------------------------------- Problem List Details Patient Name: Tarter, Baya B. Date of Service: 06/07/2019 9:15 AM Medical Record Number: [id number] Patient Account Number: [id number] Date of Birth/Sex: 05-16-1941 (78 y.o. F) Treating RN: Curtis Sitesorthy, Joanna Primary Care Provider: Bethann PunchesMiller, Mark Other Clinician: Referring Provider: Bethann PunchesMiller, Mark Treating Provider/Extender: Linwood DibblesSTONE III, Sherrick Araki Weeks in Treatment: 0 Active Problems ICD-10 Evaluated Encounter Code Description Active Date Today Diagnosis S81.802A Unspecified open wound, left lower leg, initial encounter 06/07/2019 No  Yes L97.828 Non-pressure chronic ulcer of other part of left lower leg with 06/07/2019 No Yes other specified severity L03.116 Cellulitis of left lower limb 06/07/2019 No Yes I48.0 Paroxysmal atrial fibrillation 06/07/2019 No Yes I10 Essential (primary) hypertension 06/07/2019 No Yes I50.42 Chronic combined systolic (congestive) and diastolic 06/07/2019 No Yes (congestive) heart failure Z79.01 Long term (current) use of anticoagulants 06/07/2019 No Yes Inactive Problems Resolved Problems Electronic Signature(s) Signed: 06/07/2019 9:51:23 AM By: Lenda KelpStone III, Caelen Reierson PA-C Previous Signature: 06/07/2019 9:50:14 AM Version By: Lenda KelpStone III, Paco Cislo PA-C Entered By: Lenda KelpStone III, Jahson Emanuele on 06/07/2019 09:51:23 [place], Adelaine B. ([id number]) -------------------------------------------------------------------------------- Progress Note Details Patient Name: Gilland, Alyssha B. Date of Service: 06/07/2019 9:15 AM Medical Record Number: [id number] Patient Account Number: [id number] Date of Birth/Sex: 05-16-1941 (78 y.o. F) Treating RN: Curtis Sitesorthy, Joanna Primary Care Provider: Bethann PunchesMiller, Mark Other Clinician: Referring Provider: Bethann PunchesMiller, Mark Treating Provider/Extender: Linwood DibblesSTONE III, Deeandra Jerry Weeks in Treatment: 0 Subjective Chief Complaint Information obtained from Patient Left posterior LE ulcer History of Present Illness (HPI) 06/07/2019 upon evaluation today patient presents today for initial evaluation in our clinic concerning issues she is having with her left lower extremity. She tells me that she hit her leg on a rocking chair when she was getting up and this was roughly at the beginning of December. Dr. Hyacinth MeekerMiller who is her primary care provider has been helping with treating this area since that time. The patient states unfortunately this just is not getting any better. He is attempted salt water soaks as well as using antibiotic ointments topically unfortunately she has not noted much improvement. He wanted her to come to the wound  center sooner but she declined although when things did not seem to be getting better she finally consented to this. Currently the patient does appear to have cellulitis of left lower extremity she also has atrial fibrillation noted for which she is on Eliquis this is probably part of the reason that she developed a large hematoma that she did. Subsequently she also has hypertension as well as congestive heart failure. The patient does not appear to be febrile today and really is doing fairly well as far as that is concerned although the leg is warm to touch pretty much throughout. No fevers, chills, nausea, vomiting, or diarrhea. She has not been on any oral antibiotics. Patient History Information obtained from Patient. Allergies penicillin, amlodipine, Vicodin, Betadine, Sulfa (Sulfonamide Antibiotics) Social History Never smoker, Marital Status - Widowed, Alcohol Use - Rarely, Drug Use - No History, Caffeine Use - Moderate. Medical History Eyes Patient  has history of Cataracts - removed Denies history of Glaucoma, Optic Neuritis Hematologic/Lymphatic Denies history of Anemia, Hemophilia, Human Immunodeficiency Virus, Lymphedema, Sickle Cell Disease Respiratory Denies history of Aspiration, Asthma, Chronic Obstructive Pulmonary Disease (COPD), Pneumothorax, Sleep Apnea, Tuberculosis Cardiovascular Patient has history of Arrhythmia - A Fib, Congestive Heart Failure, Hypertension Denies history of Coronary Artery Disease, Deep Vein Thrombosis, Hypotension, Myocardial Infarction, Peripheral Arterial Disease, Peripheral Venous Disease, Phlebitis, Vasculitis Gastrointestinal Denies history of Cirrhosis , Colitis, Crohn s, Hepatitis A, Hepatitis B, Hepatitis C Endocrine Denies history of Type I Diabetes, Type II Diabetes Genitourinary Hanners, Ayaana B. ([id number]) Denies history of End Stage Renal Disease Immunological Denies history of Lupus Erythematosus, Raynaud s,  Scleroderma Integumentary (Skin) Denies history of History of Burn, History of pressure wounds Musculoskeletal Patient has history of Gout Denies history of Rheumatoid Arthritis, Osteoarthritis, Osteomyelitis Neurologic Denies history of Dementia, Neuropathy, Quadriplegia, Paraplegia, Seizure Disorder Oncologic Denies history of Received Chemotherapy, Received Radiation Psychiatric Denies history of Anorexia/bulimia, Confinement Anxiety Hospitalization/Surgery History - [facility] sepsis. Review of Systems (ROS) Constitutional Symptoms (General Health) Denies complaints or symptoms of Fatigue, Fever, Chills, Marked Weight Change. Eyes Complains or has symptoms of Glasses / Contacts - glasses. Denies complaints or symptoms of Dry Eyes, Vision Changes, macular degeneration Ear/Nose/Mouth/Throat Denies complaints or symptoms of Difficult clearing ears, Sinusitis. Hematologic/Lymphatic Denies complaints or symptoms of Bleeding / Clotting Disorders, Human Immunodeficiency Virus. Respiratory Denies complaints or symptoms of Chronic or frequent coughs, Shortness of Breath. Cardiovascular Denies complaints or symptoms of Chest pain, LE edema. Gastrointestinal Denies complaints or symptoms of Frequent diarrhea, Nausea, Vomiting. Endocrine Denies complaints or symptoms of Hepatitis, Thyroid disease, Polydypsia (Excessive Thirst). Genitourinary Denies complaints or symptoms of Kidney failure/ Dialysis, Incontinence/dribbling. Immunological Denies complaints or symptoms of Hives, Itching. Integumentary (Skin) Complains or has symptoms of Wounds. Denies complaints or symptoms of Bleeding or bruising tendency, Breakdown, Swelling. Musculoskeletal Denies complaints or symptoms of Muscle Pain, Muscle Weakness. Neurologic Denies complaints or symptoms of Numbness/parasthesias, Focal/Weakness. Psychiatric Denies complaints or symptoms of Anxiety, Claustrophobia. General Notes: Pt states she is  adopted and has no knowledge of family hx Objective Mouser, Mansi B. ([id number]) Constitutional sitting or standing blood pressure is within target range for patient.. pulse regular and within target range for patient.Marland Kitchen respirations regular, non-labored and within target range for patient.Marland Kitchen temperature within target range for patient.. Well- nourished and well-hydrated in no acute distress. Vitals Time Taken: 9:24 AM, Height: 66 in, Source: Stated, Weight: 201 lbs, Source: Stated, BMI: 32.4, Temperature: 98.5 F, Pulse: 68 bpm, Respiratory Rate: 16 breaths/min, Blood Pressure: 122/72 mmHg. Eyes conjunctiva clear no eyelid edema noted. pupils equal round and reactive to light and accommodation. Ears, Nose, Mouth, and Throat no gross abnormality of ear auricles or external auditory canals. normal hearing noted during conversation. mucus membranes moist. Respiratory normal breathing without difficulty. Cardiovascular 2+ dorsalis pedis/posterior tibialis pulses. no clubbing, cyanosis, significant edema, Gastrointestinal (GI) soft, non-tender, non-distended, +BS. no ventral hernia noted. Musculoskeletal normal gait and posture. no significant deformity or arthritic changes, no loss or range of motion, no clubbing. Psychiatric this patient is able to make decisions and demonstrates good insight into disease process. Alert and Oriented x 3. pleasant and cooperative. General Notes: Upon inspection patient's wound bed actually showed signs of necrotic tissue on the surface of the wound. There appears to be cellulitis pretty much over the entire region between the ankle and the knee of the left lower extremity which is concerning to me as well. Fortunately there is no  evidence of systemic infection which is good news. Overall I am unable to obtain ABIs today although that something that we really need to know as far as her arterial flow to ensure that she is progressing well and is able to heal this  area effectively. We will likely need to make a referral to vascular to further check this out. Integumentary (Hair, Skin) Wound #1 status is Open. Original cause of wound was Trauma. The wound is located on the Left,Posterior Lower Leg. The wound measures 4.1cm length x 4.5cm width x 0.1cm depth; 14.491cm^2 area and 1.449cm^3 volume. There is Fat Layer (Subcutaneous Tissue) Exposed exposed. There is no tunneling or undermining noted. There is a medium amount of serosanguineous drainage noted. The wound margin is flat and intact. There is no granulation within the wound bed. There is a large (67-100%) amount of necrotic tissue within the wound bed including Eschar and Adherent Slough. Assessment Active Problems ICD-10 Unspecified open wound, left lower leg, initial encounter Non-pressure chronic ulcer of other part of left lower leg with other specified severity Cellulitis of left lower limb Paroxysmal atrial fibrillation Ackman, Saadia B. ([id number]) Essential (primary) hypertension Chronic combined systolic (congestive) and diastolic (congestive) heart failure Long term (current) use of anticoagulants Procedures Wound #1 Pre-procedure diagnosis of Wound #1 is a Trauma, Other located on the Left,Posterior Lower Leg . There was a Chemical/Enzymatic/Mechanical debridement performed by STONE III, Malyah Ohlrich E., PA-C. With the following instrument(s): saline and gauze after achieving pain control using Lidocaine 4% Topical Solution. Other agent used was Saline. A time out was conducted at 10:02, prior to the start of the procedure. There was no bleeding. The procedure was tolerated well with a pain level of 0 throughout and a pain level of 0 following the procedure. Post Debridement Measurements: 4.1cm length x 4.5cm width x 0.1cm depth; 1.449cm^3 volume. Character of Wound/Ulcer Post Debridement is improved. Post procedure Diagnosis Wound #1: Same as Pre-Procedure Plan Wound Cleansing: Wound #1  Left,Posterior Lower Leg: Clean wound with Normal Saline. May shower with protection. Anesthetic (add to Medication List): Wound #1 Left,Posterior Lower Leg: Topical Lidocaine 4% cream applied to wound bed prior to debridement (In Clinic Only). Primary Wound Dressing: Wound #1 Left,Posterior Lower Leg: Silver Alginate Secondary Dressing: Wound #1 Left,Posterior Lower Leg: ABD pad Conform/Kerlix XtraSorb Dressing Change Frequency: Wound #1 Left,Posterior Lower Leg: Change dressing every other day. - daily if needed for drainage Follow-up Appointments: Wound #1 Left,Posterior Lower Leg: Return Appointment in 1 week. Edema Control: Wound #1 Left,Posterior Lower Leg: Elevate legs to the level of the heart and pump ankles as often as possible Other: - [facility] Laboratory ordered were: Wound culture routine Services and Therapies ordered were: Arterial Studies- Bilateral Naumann, Reverie B. ([id number]) The following medication(s) was prescribed: doxycycline hyclate oral 100 mg capsule 1 1 capsule oral taken 2 times a day for 14 days. Do not take vitamins, calcium, or antacids with this medication starting 06/07/2019 1. Based on what I am seeing at this point I think that the patient would do well with likely a silver alginate dressing for the time being she does have a lot of drainage. Hopefully this will be of benefit for her. With that being said I do believe that she needs some light compression at least and for now would recommend an ABD pad, XtraSorb, and con form secured with Tubigrip in order to help with this. She does have her individual with her today that is functioning as a caregiver and  subsequently would help with the dressing changes who can help her at home will show them how to do this as well. 2. I am also can recommend currently that we go ahead and obtain a wound culture which was done today we will see what the results of that show once everything returns. 3. I am  ordering arterial studies bilaterally for the patient in order to evaluate for appropriate arterial flow to get this to heal. Obviously I want to ensure that were given her the best chance to have the best outcome as possible. 4. With regard to the infection I would go ahead and place her on doxycycline while we hold off on any other recommendations or alterations depending on the results of the culture. At least we get something started it does appear she has fairly significant cellulitis at this point. We will see patient back for reevaluation in 1 week here in the clinic. If anything worsens or changes patient will contact our office for additional recommendations. 4 days here in the clinic. If anything worsens in the meantime she will contact the office and let me know. I do not want her to go too long in between visits however until we can get this infection under control. 06/11/2019 actually did review the patient's culture today she actually came in for nurse visit as well. Unfortunately she is not doing any better in fact she still has severe erythema and cellulitis of the left lower extremity and the wound is still extremely tender to touch. Obviously I am concerned about cellulitis and the fact that she could become septic if not appropriately treated. The ideal antibiotic at this case considering she was positive for Enterococcus would be Augmentin as an outpatient. The doxycycline has not been to be beneficial and in fact has made no difference for over the past 4 days. With that being said I do believe that due to her prior allergy to penicillin even though it was years ago she had hives I really would not feel comfortable in outpatient setting testing her on the amoxicillin again. For that reason I think she can require some form of IV antibiotics possibly vancomycin. For that reason I am sending her to the ER for further evaluation I did speak with the triage nurse as well today to  alert that we were sending her that way for evaluation and hopefully IV antibiotic therapy to be initiated. They voiced understanding and I did alert the patient of this as well. I am sending a copy of this note as well as the culture result although obviously there will be to see that in epic as well. Electronic Signature(s) Signed: 06/11/2019 1:16:34 PM By: Lenda Kelp PA-C Previous Signature: 06/07/2019 12:14:01 PM Version By: Lenda Kelp PA-C Entered By: Lenda Kelp on 06/11/2019 13:16:33 Dvorsky, Davinity B. ([id number]) -------------------------------------------------------------------------------- ROS/PFSH Details Patient Name: Sproull, Aixa B. Date of Service: 06/07/2019 9:15 AM Medical Record Number: [id number] Patient Account Number: [id number] Date of Birth/Sex: Apr 22, 1941 (78 y.o. F) Treating RN: Rodell Perna Primary Care Provider: [name] Other Clinician: Referring Provider: [name] Treating Provider/Extender: [name], Emery Dupuy Weeks in Treatment: 0 Information Obtained From Patient Constitutional Symptoms (General Health) Complaints and Symptoms: Negative for: Fatigue; Fever; Chills; Marked Weight Change Eyes Complaints and Symptoms: Positive for: Glasses / Contacts - glasses Negative for: Dry Eyes; Vision Changes Review of System Notes: macular degeneration Medical History: Positive for: Cataracts - removed Negative for: Glaucoma; Optic Neuritis Ear/Nose/Mouth/Throat Complaints and Symptoms:  Negative for: Difficult clearing ears; Sinusitis Hematologic/Lymphatic Complaints and Symptoms: Negative for: Bleeding / Clotting Disorders; Human Immunodeficiency Virus Medical History: Negative for: Anemia; Hemophilia; Human Immunodeficiency Virus; Lymphedema; Sickle Cell Disease Respiratory Complaints and Symptoms: Negative for: Chronic or frequent coughs; Shortness of Breath Medical History: Negative for: Aspiration; Asthma; Chronic Obstructive Pulmonary  Disease (COPD); Pneumothorax; Sleep Apnea; Tuberculosis Cardiovascular Complaints and Symptoms: Negative for: Chest pain; LE edema Medical History: Positive for: Arrhythmia - A Fib; Congestive Heart Failure; Hypertension Negative for: Coronary Artery Disease; Deep Vein Thrombosis; Hypotension; Myocardial Infarction; Peripheral Arterial [place], Elois B. ([id number]) Disease; Peripheral Venous Disease; Phlebitis; Vasculitis Gastrointestinal Complaints and Symptoms: Negative for: Frequent diarrhea; Nausea; Vomiting Medical History: Negative for: Cirrhosis ; Colitis; Crohnos; Hepatitis A; Hepatitis B; Hepatitis C Endocrine Complaints and Symptoms: Negative for: Hepatitis; Thyroid disease; Polydypsia (Excessive Thirst) Medical History: Negative for: Type I Diabetes; Type II Diabetes Genitourinary Complaints and Symptoms: Negative for: Kidney failure/ Dialysis; Incontinence/dribbling Medical History: Negative for: End Stage Renal Disease Immunological Complaints and Symptoms: Negative for: Hives; Itching Medical History: Negative for: Lupus Erythematosus; Raynaudos; Scleroderma Integumentary (Skin) Complaints and Symptoms: Positive for: Wounds Negative for: Bleeding or bruising tendency; Breakdown; Swelling Medical History: Negative for: History of Burn; History of pressure wounds Musculoskeletal Complaints and Symptoms: Negative for: Muscle Pain; Muscle Weakness Medical History: Positive for: Gout Negative for: Rheumatoid Arthritis; Osteoarthritis; Osteomyelitis Neurologic Complaints and Symptoms: Negative for: Numbness/parasthesias; Focal/Weakness Medical History: Negative for: Dementia; Neuropathy; Quadriplegia; Paraplegia; Seizure Disorder Wolfert, Hinata B. ([id number]) Psychiatric Complaints and Symptoms: Negative for: Anxiety; Claustrophobia Medical History: Negative for: Anorexia/bulimia; Confinement Anxiety Oncologic Medical History: Negative for: Received  Chemotherapy; Received Radiation HBO Extended History Items Eyes: Cataracts Immunizations Pneumococcal Vaccine: Received Pneumococcal Vaccination: Yes Implantable Devices None Hospitalization / Surgery History Type of Hospitalization/Surgery [facility] sepsis Family and Social History Never smoker; Marital Status - Widowed; Alcohol Use: Rarely; Drug Use: No History; Caffeine Use: Moderate; Financial Concerns: No; Food, Clothing or Shelter Needs: No; Support System Lacking: No; Transportation Concerns: No Notes Pt states she is adopted and has no knowledge of family hx Psychologist, prison and probation services) Signed: 06/07/2019 11:38:50 AM By: Rodell Perna Signed: 06/07/2019 12:29:09 PM By: Lenda Kelp PA-C Entered By: Rodell Perna on 06/07/2019 09:33:28 Cavanaugh, Luiza B. ([id number]) -------------------------------------------------------------------------------- SuperBill Details Patient Name: Rayburn, Iviana B. Date of Service: 06/07/2019 Medical Record Number: [id number] Patient Account Number: [id number] Date of Birth/Sex: [birth year] (79 y.o. F) Treating RN: [name] Primary Care Provider: [name] Other Clinician: Referring Provider: [name] Treating Provider/Extender: [name], Paulette Rockford Weeks in Treatment: 0 Diagnosis Coding ICD-10 Codes Code Description [phone] Unspecified open wound, left lower leg, initial encounter L97.828 Non-pressure chronic ulcer of other part of left lower leg with other specified severity L03.116 Cellulitis of left lower limb I48.0 Paroxysmal atrial fibrillation I10 Essential (primary) hypertension I50.42 Chronic combined systolic (congestive) and diastolic (congestive) heart failure Z79.01 Long term (current) use of anticoagulants Facility Procedures CPT4 Code: [id number] Description: 99214 - WOUND CARE VISIT-LEV 4 EST PT Modifier: Quantity: 1 Physician Procedures CPT4 Code Description: [id number] 63785 - WC PHYS LEVEL 4 - NEW PT ICD-10 Diagnosis Description  S81.802A Unspecified open wound, left lower leg, initial encounter L97.828 Non-pressure chronic ulcer of other part of left lower leg with L03.116 Cellulitis of  left lower limb I48.0 Paroxysmal atrial fibrillation Modifier: other specified s Quantity: 1 everity Electronic Signature(s) Signed: 06/07/2019 12:15:18 PM By: Lenda Kelp PA-C Previous Signature: 06/07/2019 12:14:17 PM Version By: [name] Entered By: Lenda Kelp on 06/07/2019 12:15:17

## 2019-06-07 NOTE — Progress Notes (Signed)
Emme, Copper B. ([id number]) Visit Report for 06/07/2019 Abuse/Suicide Risk Screen Details Patient Name: [name], [name] Date of Service: 06/07/2019 9:15 AM Medical Record Number: [id number] Patient Account Number: [id number] Date of Birth/Sex: May 03, 1941 (79 y.o. F) Treating RN: Army Melia Primary Care Genevie Elman: Emily Filbert Other Clinician: Referring Maycol Hoying: Emily Filbert Treating Aliviyah Malanga/Extender: Melburn Hake, [name] Weeks in Treatment: 0 Abuse/Suicide Risk Screen Items Answer ABUSE RISK SCREEN: Has anyone close to you tried to hurt or harm you recentlyo No Do you feel uncomfortable with anyone in your familyo No Has anyone forced you do things that you didnot want to doo No Electronic Signature(s) Signed: 06/07/2019 11:38:50 AM By: Army Melia Entered By: Army Melia on 06/07/2019 09:33:39 Nowaczyk, Cheridan B. ([id number]) -------------------------------------------------------------------------------- Activities of Daily Living Details Patient Name: Runde, [name] Date of Service: 06/07/2019 9:15 AM Medical Record Number: [id number] Patient Account Number: [id number] Date of Birth/Sex: 1940-12-03 (79 y.o. F) Treating RN: Army Melia Primary Care Taylon Louison: Emily Filbert Other Clinician: Referring Janean Eischen: Emily Filbert Treating Emmily Pellegrin/Extender: Melburn Hake, [name] Weeks in Treatment: 0 Activities of Daily Living Items Answer Activities of Daily Living (Please select one for each item) Drive Automobile Completely Able Take Medications Completely Able Use Telephone Completely Able Care for Appearance Completely Able Use Toilet Completely Able Bath / Shower Completely Able Dress Self Completely Able Feed Self Completely Able Walk Completely Able Get In / Out Bed Completely Able Housework Completely Able Prepare Meals Completely Able Handle Money Completely Able Shop for Self Completely Able Electronic Signature(s) Signed: 06/07/2019 11:38:50 AM By: Army Melia Entered By: Army Melia  on 06/07/2019 09:34:01 Trawick, Jaianna B. ([id number]) -------------------------------------------------------------------------------- Education Screening Details Patient Name: Gouger, [name] Date of Service: 06/07/2019 9:15 AM Medical Record Number: [id number] Patient Account Number: [id number] Date of Birth/Sex: 06-08-1940 (79 y.o. F) Treating RN: Army Melia Primary Care Anaston Koehn: Emily Filbert Other Clinician: Referring Quindarrius Joplin: Emily Filbert Treating Magali Bray/Extender: Melburn Hake, [name] Weeks in Treatment: 0 Primary Learner Assessed: Patient Learning Preferences/Education Level/Primary Language Learning Preference: Explanation Highest Education Level: College or Above Preferred Language: English Cognitive Barrier Language Barrier: No Translator Needed: No Memory Deficit: No Emotional Barrier: No Cultural/Religious Beliefs Affecting Medical Care: No Physical Barrier Impaired Vision: No Impaired Hearing: No Decreased Hand dexterity: No Knowledge/Comprehension Knowledge Level: High Comprehension Level: High Ability to understand written High instructions: Ability to understand verbal High instructions: Motivation Anxiety Level: Calm Cooperation: Cooperative Education Importance: Acknowledges Need Interest in Health Problems: Asks Questions Perception: Coherent Willingness to Engage in Self- High Management Activities: Readiness to Engage in Self- High Management Activities: Electronic Signature(s) Signed: 06/07/2019 11:38:50 AM By: Army Melia Entered By: Army Melia on 06/07/2019 09:34:19 Podesta, Maryanne B. ([id number]) -------------------------------------------------------------------------------- Fall Risk Assessment Details Patient Name: Hise, [name] Date of Service: 06/07/2019 9:15 AM Medical Record Number: [id number] Patient Account Number: [id number] Date of Birth/Sex: 11/27/1940 (79 y.o. F) Treating RN: Army Melia Primary Care Tiras Bianchini: Emily Filbert Other  Clinician: Referring Raysean Graumann: Emily Filbert Treating Mozel Burdett/Extender: Melburn Hake, [name] Weeks in Treatment: 0 Fall Risk Assessment Items Have you had 2 or more falls in the last 12 monthso 0 No Have you had any fall that resulted in injury in the last 12 monthso 0 No FALLS RISK SCREEN History of falling - immediate or within 3 months 0 No Secondary diagnosis (Do you have 2 or more medical diagnoseso) 0 No Ambulatory aid None/bed rest/wheelchair/nurse 0 No Crutches/cane/walker 15 Yes Furniture 0 No Intravenous therapy Access/Saline/Heparin Lock 0 No Gait/Transferring Normal/ bed rest/ wheelchair 0 No Weak (  short steps with or without shuffle, stooped but able to lift head while 0 No walking, may seek support from furniture) Impaired (short steps with shuffle, may have difficulty arising from chair, head 0 No down, impaired balance) Mental Status Oriented to own ability 0 No Electronic Signature(s) Signed: 06/07/2019 11:38:50 AM By: Rodell Perna Entered By: Rodell Perna on 06/07/2019 09:34:29 Figuereo, Coco B. ([id number]) -------------------------------------------------------------------------------- Foot Assessment Details Patient Name: Hailes, [name] Date of Service: 06/07/2019 9:15 AM Medical Record Number: [id number] Patient Account Number: [id number] Date of Birth/Sex: [birth year] (79 y.o. F) Treating RN: Rodell Perna Primary Care Dejae Bernet: [name] Other Clinician: Referring Luise Yamamoto: [name] Treating Justise Ehmann/Extender: [name], [name] Weeks in Treatment: 0 Foot Assessment Items Site Locations + = Sensation present, - = Sensation absent, C = Callus, U = Ulcer R = Redness, W = Warmth, M = Maceration, PU = Pre-ulcerative lesion F = Fissure, S = Swelling, D = Dryness Assessment Right: Left: Other Deformity: No No Prior Foot Ulcer: No No Prior Amputation: No No Charcot Joint: No No Ambulatory Status: Ambulatory Without Help Gait: Steady Electronic  Signature(s) Signed: 06/07/2019 11:38:50 AM By: Rodell Perna Entered By: Rodell Perna on 06/07/2019 09:35:34 Deharo, Blakleigh B. ([id number]) -------------------------------------------------------------------------------- Nutrition Risk Screening Details Patient Name: Klingerman, Dreamer B. Date of Service: 06/07/2019 9:15 AM Medical Record Number: [id number] Patient Account Number: [id number] Date of Birth/Sex: 30-May-1940 (78 y.o. F) Treating RN: Rodell Perna Primary Care Liesl Simons: [name] Other Clinician: Referring Zachory Mangual: [name] Treating Nery Frappier/Extender: [name], [name] Weeks in Treatment: 0 Height (in): 66 Weight (lbs): 201 Body Mass Index (BMI): 32.4 Nutrition Risk Screening Items Score Screening NUTRITION RISK SCREEN: I have an illness or condition that made me change the kind and/or amount of 0 No food I eat I eat fewer than two meals per day 0 No I eat few fruits and vegetables, or milk products 0 No I have three or more drinks of beer, liquor or wine almost every day 0 No I have tooth or mouth problems that make it hard for me to eat 0 No I don't always have enough money to buy the food I need 0 No I eat alone most of the time 0 No I take three or more different prescribed or over-the-counter drugs a day 0 No Without wanting to, I have lost or gained 10 pounds in the last six months 0 No I am not always physically able to shop, cook and/or feed myself 0 No Nutrition Protocols Good Risk Protocol 0 No interventions needed Moderate Risk Protocol High Risk Proctocol Risk Level: Good Risk Score: 0 Electronic Signature(s) Signed: 06/07/2019 11:38:50 AM By: Rodell Perna Entered By: Rodell Perna on 06/07/2019 09:34:34

## 2019-06-10 LAB — AEROBIC CULTURE W GRAM STAIN (SUPERFICIAL SPECIMEN)

## 2019-06-11 ENCOUNTER — Other Ambulatory Visit (INDEPENDENT_AMBULATORY_CARE_PROVIDER_SITE_OTHER): Payer: Self-pay | Admitting: Physician Assistant

## 2019-06-11 ENCOUNTER — Emergency Department: Payer: Medicare Other

## 2019-06-11 ENCOUNTER — Ambulatory Visit (INDEPENDENT_AMBULATORY_CARE_PROVIDER_SITE_OTHER): Payer: Medicare Other

## 2019-06-11 ENCOUNTER — Encounter: Payer: Self-pay | Admitting: Emergency Medicine

## 2019-06-11 ENCOUNTER — Inpatient Hospital Stay
Admission: EM | Admit: 2019-06-11 | Discharge: 2019-06-19 | DRG: 603 | Disposition: A | Discharge status: Home Health | Payer: Medicare Other | Source: Ambulatory Visit | Attending: Internal Medicine | Admitting: Internal Medicine

## 2019-06-11 ENCOUNTER — Other Ambulatory Visit: Payer: Self-pay

## 2019-06-11 DIAGNOSIS — Z7952 Long term (current) use of systemic steroids: Secondary | ICD-10-CM

## 2019-06-11 DIAGNOSIS — I10 Essential (primary) hypertension: Secondary | ICD-10-CM | POA: Diagnosis present

## 2019-06-11 DIAGNOSIS — N189 Chronic kidney disease, unspecified: Secondary | ICD-10-CM | POA: Diagnosis present

## 2019-06-11 DIAGNOSIS — I110 Hypertensive heart disease with heart failure: Secondary | ICD-10-CM | POA: Insufficient documentation

## 2019-06-11 DIAGNOSIS — Z79891 Long term (current) use of opiate analgesic: Secondary | ICD-10-CM

## 2019-06-11 DIAGNOSIS — Z882 Allergy status to sulfonamides status: Secondary | ICD-10-CM

## 2019-06-11 DIAGNOSIS — L03116 Cellulitis of left lower limb: Secondary | ICD-10-CM | POA: Diagnosis not present

## 2019-06-11 DIAGNOSIS — Z883 Allergy status to other anti-infective agents status: Secondary | ICD-10-CM

## 2019-06-11 DIAGNOSIS — Z79890 Hormone replacement therapy (postmenopausal): Secondary | ICD-10-CM

## 2019-06-11 DIAGNOSIS — Z8619 Personal history of other infectious and parasitic diseases: Secondary | ICD-10-CM

## 2019-06-11 DIAGNOSIS — Z880 Allergy status to penicillin: Secondary | ICD-10-CM

## 2019-06-11 DIAGNOSIS — S81802A Unspecified open wound, left lower leg, initial encounter: Secondary | ICD-10-CM | POA: Insufficient documentation

## 2019-06-11 DIAGNOSIS — G4700 Insomnia, unspecified: Secondary | ICD-10-CM | POA: Diagnosis present

## 2019-06-11 DIAGNOSIS — I480 Paroxysmal atrial fibrillation: Secondary | ICD-10-CM | POA: Insufficient documentation

## 2019-06-11 DIAGNOSIS — T501X5A Adverse effect of loop [high-ceiling] diuretics, initial encounter: Secondary | ICD-10-CM | POA: Diagnosis not present

## 2019-06-11 DIAGNOSIS — Z20822 Contact with and (suspected) exposure to covid-19: Secondary | ICD-10-CM | POA: Diagnosis present

## 2019-06-11 DIAGNOSIS — Z79899 Other long term (current) drug therapy: Secondary | ICD-10-CM

## 2019-06-11 DIAGNOSIS — I5032 Chronic diastolic (congestive) heart failure: Secondary | ICD-10-CM | POA: Diagnosis present

## 2019-06-11 DIAGNOSIS — I4891 Unspecified atrial fibrillation: Secondary | ICD-10-CM | POA: Diagnosis present

## 2019-06-11 DIAGNOSIS — M109 Gout, unspecified: Secondary | ICD-10-CM | POA: Diagnosis present

## 2019-06-11 DIAGNOSIS — R609 Edema, unspecified: Secondary | ICD-10-CM | POA: Diagnosis present

## 2019-06-11 DIAGNOSIS — Z791 Long term (current) use of non-steroidal anti-inflammatories (NSAID): Secondary | ICD-10-CM

## 2019-06-11 DIAGNOSIS — L97828 Non-pressure chronic ulcer of other part of left lower leg with other specified severity: Secondary | ICD-10-CM | POA: Diagnosis not present

## 2019-06-11 DIAGNOSIS — Z8701 Personal history of pneumonia (recurrent): Secondary | ICD-10-CM

## 2019-06-11 DIAGNOSIS — B952 Enterococcus as the cause of diseases classified elsewhere: Secondary | ICD-10-CM | POA: Diagnosis present

## 2019-06-11 DIAGNOSIS — T502X5A Adverse effect of carbonic-anhydrase inhibitors, benzothiadiazides and other diuretics, initial encounter: Secondary | ICD-10-CM | POA: Diagnosis present

## 2019-06-11 DIAGNOSIS — I5042 Chronic combined systolic (congestive) and diastolic (congestive) heart failure: Secondary | ICD-10-CM | POA: Diagnosis not present

## 2019-06-11 DIAGNOSIS — Z881 Allergy status to other antibiotic agents status: Secondary | ICD-10-CM

## 2019-06-11 DIAGNOSIS — I440 Atrioventricular block, first degree: Secondary | ICD-10-CM | POA: Diagnosis not present

## 2019-06-11 DIAGNOSIS — I959 Hypotension, unspecified: Secondary | ICD-10-CM | POA: Diagnosis not present

## 2019-06-11 DIAGNOSIS — K219 Gastro-esophageal reflux disease without esophagitis: Secondary | ICD-10-CM | POA: Diagnosis present

## 2019-06-11 DIAGNOSIS — W2203XA Walked into furniture, initial encounter: Secondary | ICD-10-CM | POA: Insufficient documentation

## 2019-06-11 DIAGNOSIS — Z9841 Cataract extraction status, right eye: Secondary | ICD-10-CM

## 2019-06-11 DIAGNOSIS — K5900 Constipation, unspecified: Secondary | ICD-10-CM | POA: Diagnosis present

## 2019-06-11 DIAGNOSIS — Z9842 Cataract extraction status, left eye: Secondary | ICD-10-CM

## 2019-06-11 DIAGNOSIS — T50905A Adverse effect of unspecified drugs, medicaments and biological substances, initial encounter: Secondary | ICD-10-CM | POA: Diagnosis present

## 2019-06-11 DIAGNOSIS — F329 Major depressive disorder, single episode, unspecified: Secondary | ICD-10-CM | POA: Diagnosis present

## 2019-06-11 DIAGNOSIS — N179 Acute kidney failure, unspecified: Secondary | ICD-10-CM | POA: Diagnosis present

## 2019-06-11 DIAGNOSIS — Z90710 Acquired absence of both cervix and uterus: Secondary | ICD-10-CM

## 2019-06-11 DIAGNOSIS — F419 Anxiety disorder, unspecified: Secondary | ICD-10-CM | POA: Diagnosis present

## 2019-06-11 DIAGNOSIS — R339 Retention of urine, unspecified: Secondary | ICD-10-CM | POA: Diagnosis present

## 2019-06-11 DIAGNOSIS — Z885 Allergy status to narcotic agent status: Secondary | ICD-10-CM

## 2019-06-11 DIAGNOSIS — Z7901 Long term (current) use of anticoagulants: Secondary | ICD-10-CM

## 2019-06-11 DIAGNOSIS — Z87891 Personal history of nicotine dependence: Secondary | ICD-10-CM

## 2019-06-11 HISTORY — DX: Chronic diastolic (congestive) heart failure: I50.32

## 2019-06-11 LAB — COMPREHENSIVE METABOLIC PANEL
ALT: 21 U/L (ref 0–44)
AST: 22 U/L (ref 15–41)
Albumin: 3.7 g/dL (ref 3.5–5.0)
Alkaline Phosphatase: 65 U/L (ref 38–126)
Anion gap: 14 (ref 5–15)
BUN: 50 mg/dL — ABNORMAL HIGH (ref 8–23)
CO2: 25 mmol/L (ref 22–32)
Calcium: 10.4 mg/dL — ABNORMAL HIGH (ref 8.9–10.3)
Chloride: 101 mmol/L (ref 98–111)
Creatinine, Ser: 1.82 mg/dL — ABNORMAL HIGH (ref 0.44–1.00)
GFR calc Af Amer: 31 mL/min — ABNORMAL LOW (ref >60)
GFR calc non Af Amer: 27 mL/min — ABNORMAL LOW (ref >60)
Glucose, Bld: 138 mg/dL — ABNORMAL HIGH (ref 70–99)
Potassium: 4.5 mmol/L (ref 3.5–5.1)
Sodium: 140 mmol/L (ref 135–145)
Total Bilirubin: 0.9 mg/dL (ref 0.3–1.2)
Total Protein: 7.5 g/dL (ref 6.5–8.1)

## 2019-06-11 LAB — CBC WITH DIFFERENTIAL/PLATELET
Abs Immature Granulocytes: 0.05 10*3/uL (ref 0.00–0.07)
Basophils Absolute: 0.1 10*3/uL (ref 0.0–0.1)
Basophils Relative: 1 %
Eosinophils Absolute: 0.1 10*3/uL (ref 0.0–0.5)
Eosinophils Relative: 1 %
HCT: 40.7 % (ref 36.0–46.0)
Hemoglobin: 13.3 g/dL (ref 12.0–15.0)
Immature Granulocytes: 1 %
Lymphocytes Relative: 12 %
Lymphs Abs: 1.2 10*3/uL (ref 0.7–4.0)
MCH: 33.7 pg (ref 26.0–34.0)
MCHC: 32.7 g/dL (ref 30.0–36.0)
MCV: 103 fL — ABNORMAL HIGH (ref 80.0–100.0)
Monocytes Absolute: 0.6 10*3/uL (ref 0.1–1.0)
Monocytes Relative: 6 %
Neutro Abs: 7.4 10*3/uL (ref 1.7–7.7)
Neutrophils Relative %: 79 %
Platelets: 263 10*3/uL (ref 150–400)
RBC: 3.95 MIL/uL (ref 3.87–5.11)
RDW: 13 % (ref 11.5–15.5)
WBC: 9.4 10*3/uL (ref 4.0–10.5)
nRBC: 0 % (ref 0.0–0.2)

## 2019-06-11 LAB — VANCOMYCIN, RANDOM: Vancomycin Rm: 39

## 2019-06-11 LAB — LACTIC ACID, PLASMA
Lactic Acid, Venous: 1.4 mmol/L (ref 0.5–1.9)
Lactic Acid, Venous: 1.4 mmol/L (ref 0.5–1.9)

## 2019-06-11 MED ORDER — TIZANIDINE HCL 2 MG PO TABS
2.0000 mg | ORAL_TABLET | Freq: Three times a day (TID) | ORAL | Status: DC | PRN
  Administered 2019-06-11 – 2019-06-15 (×4): 2 mg via ORAL
  Filled 2019-06-11 (×7): qty 1

## 2019-06-11 MED ORDER — SODIUM CHLORIDE 0.9 % IV SOLN: INTRAVENOUS | Status: AC

## 2019-06-11 MED ORDER — FLUOXETINE HCL 20 MG PO CAPS
20.0000 mg | ORAL_CAPSULE | Freq: Every day | ORAL | Status: DC
  Administered 2019-06-12 – 2019-06-19 (×8): 20 mg via ORAL
  Filled 2019-06-11 (×8): qty 1

## 2019-06-11 MED ORDER — SODIUM CHLORIDE 0.9 % IV BOLUS
500.0000 mL | Freq: Once | INTRAVENOUS | Status: AC
  Administered 2019-06-11: 500 mL via INTRAVENOUS

## 2019-06-11 MED ORDER — OXYBUTYNIN CHLORIDE 5 MG PO TABS
5.0000 mg | ORAL_TABLET | Freq: Every evening | ORAL | Status: DC
  Administered 2019-06-11 – 2019-06-19 (×9): 5 mg via ORAL
  Filled 2019-06-11 (×9): qty 1

## 2019-06-11 MED ORDER — PRAMIPEXOLE DIHYDROCHLORIDE 0.25 MG PO TABS
0.2500 mg | ORAL_TABLET | Freq: Four times a day (QID) | ORAL | Status: DC
  Administered 2019-06-11 – 2019-06-19 (×29): 0.25 mg via ORAL
  Filled 2019-06-11 (×32): qty 1

## 2019-06-11 MED ORDER — FLUTICASONE PROPIONATE 50 MCG/ACT NA SUSP
2.0000 | Freq: Every evening | NASAL | Status: DC
  Administered 2019-06-11 – 2019-06-17 (×6): 2 via NASAL
  Filled 2019-06-11: qty 16

## 2019-06-11 MED ORDER — APIXABAN 5 MG PO TABS
5.0000 mg | ORAL_TABLET | Freq: Two times a day (BID) | ORAL | Status: DC
  Administered 2019-06-11 – 2019-06-19 (×16): 5 mg via ORAL
  Filled 2019-06-11 (×16): qty 1

## 2019-06-11 MED ORDER — BISACODYL 5 MG PO TBEC: 5.0000 mg | DELAYED_RELEASE_TABLET | Freq: Every day | ORAL | Status: DC | PRN

## 2019-06-11 MED ORDER — HEPARIN SODIUM (PORCINE) 5000 UNIT/ML IJ SOLN: 5000.0000 [IU] | Freq: Three times a day (TID) | INTRAMUSCULAR | Status: DC

## 2019-06-11 MED ORDER — ASCORBIC ACID 500 MG PO TABS
1000.0000 mg | ORAL_TABLET | Freq: Every day | ORAL | Status: DC
  Administered 2019-06-11 – 2019-06-19 (×9): 1000 mg via ORAL
  Filled 2019-06-11 (×9): qty 2

## 2019-06-11 MED ORDER — TRAMADOL HCL 50 MG PO TABS
50.0000 mg | ORAL_TABLET | Freq: Three times a day (TID) | ORAL | Status: DC | PRN
  Administered 2019-06-11 – 2019-06-14 (×5): 50 mg via ORAL
  Filled 2019-06-11 (×5): qty 1

## 2019-06-11 MED ORDER — MAGNESIUM CITRATE PO SOLN: 1.0000 | Freq: Once | ORAL | Status: DC | PRN

## 2019-06-11 MED ORDER — FLECAINIDE ACETATE 100 MG PO TABS
100.0000 mg | ORAL_TABLET | Freq: Two times a day (BID) | ORAL | Status: DC
  Administered 2019-06-11 – 2019-06-19 (×16): 100 mg via ORAL
  Filled 2019-06-11 (×17): qty 1

## 2019-06-11 MED ORDER — ACETAMINOPHEN 325 MG PO TABS
650.0000 mg | ORAL_TABLET | Freq: Four times a day (QID) | ORAL | Status: DC | PRN
  Administered 2019-06-11 – 2019-06-15 (×5): 650 mg via ORAL
  Filled 2019-06-11 (×6): qty 2

## 2019-06-11 MED ORDER — ALPRAZOLAM 0.25 MG PO TABS
0.2500 mg | ORAL_TABLET | Freq: Three times a day (TID) | ORAL | Status: DC | PRN
  Administered 2019-06-16: 18:00:00 0.25 mg via ORAL
  Filled 2019-06-11: qty 1

## 2019-06-11 MED ORDER — ALBUTEROL SULFATE HFA 108 (90 BASE) MCG/ACT IN AERS
1.0000 | INHALATION_SPRAY | Freq: Four times a day (QID) | RESPIRATORY_TRACT | Status: DC | PRN
  Filled 2019-06-11: qty 6.7

## 2019-06-11 MED ORDER — CARBAMAZEPINE 100 MG PO CHEW
100.0000 mg | CHEWABLE_TABLET | Freq: Two times a day (BID) | ORAL | Status: DC | PRN
  Filled 2019-06-11: qty 1

## 2019-06-11 MED ORDER — METOPROLOL TARTRATE 25 MG PO TABS
12.5000 mg | ORAL_TABLET | Freq: Two times a day (BID) | ORAL | Status: DC
  Administered 2019-06-11: 22:00:00 12.5 mg via ORAL
  Filled 2019-06-11: qty 1

## 2019-06-11 MED ORDER — VANCOMYCIN VARIABLE DOSE PER UNSTABLE RENAL FUNCTION (PHARMACIST DOSING): Status: DC

## 2019-06-11 MED ORDER — LABETALOL HCL 5 MG/ML IV SOLN: 20.0000 mg | INTRAVENOUS | Status: DC | PRN

## 2019-06-11 MED ORDER — OMEPRAZOLE MAGNESIUM 20 MG PO TBEC: 20.0000 mg | DELAYED_RELEASE_TABLET | Freq: Every day | ORAL | Status: DC

## 2019-06-11 MED ORDER — VITAMIN B-6 50 MG PO TABS
200.0000 mg | ORAL_TABLET | Freq: Every day | ORAL | Status: DC
  Administered 2019-06-12 – 2019-06-19 (×8): 200 mg via ORAL
  Filled 2019-06-11 (×8): qty 4

## 2019-06-11 MED ORDER — PANTOPRAZOLE SODIUM 40 MG PO TBEC
40.0000 mg | DELAYED_RELEASE_TABLET | Freq: Every day | ORAL | Status: DC
  Administered 2019-06-11 – 2019-06-19 (×9): 40 mg via ORAL
  Filled 2019-06-11 (×9): qty 1

## 2019-06-11 MED ORDER — HYDRALAZINE HCL 20 MG/ML IJ SOLN
10.0000 mg | INTRAMUSCULAR | Status: DC | PRN
  Administered 2019-06-16: 17:00:00 10 mg via INTRAVENOUS
  Filled 2019-06-11: qty 1

## 2019-06-11 MED ORDER — VANCOMYCIN HCL 2000 MG/400ML IV SOLN
2000.0000 mg | Freq: Once | INTRAVENOUS | Status: AC
  Administered 2019-06-11: 17:00:00 2000 mg via INTRAVENOUS
  Filled 2019-06-11: qty 400

## 2019-06-11 MED ORDER — FUROSEMIDE 40 MG PO TABS: 40.0000 mg | ORAL_TABLET | Freq: Every day | ORAL | Status: DC

## 2019-06-11 MED ORDER — GUAIFENESIN-DM 100-10 MG/5ML PO SYRP
5.0000 mL | ORAL_SOLUTION | Freq: Three times a day (TID) | ORAL | Status: DC | PRN
  Filled 2019-06-11: qty 5

## 2019-06-11 MED ORDER — DICLOFENAC SODIUM 1 % TD GEL
1.0000 "application " | Freq: Two times a day (BID) | TRANSDERMAL | Status: DC | PRN
  Filled 2019-06-11: qty 100

## 2019-06-11 MED ORDER — ESTRADIOL 1 MG PO TABS
1.5000 mg | ORAL_TABLET | Freq: Every day | ORAL | Status: DC
  Administered 2019-06-12 – 2019-06-19 (×8): 1.5 mg via ORAL
  Filled 2019-06-11 (×8): qty 1.5

## 2019-06-11 MED ORDER — APIXABAN 5 MG PO TABS: 5.0000 mg | ORAL_TABLET | Freq: Two times a day (BID) | ORAL | Status: DC

## 2019-06-11 MED ORDER — HYDRALAZINE HCL 25 MG PO TABS
25.0000 mg | ORAL_TABLET | Freq: Three times a day (TID) | ORAL | Status: DC
  Administered 2019-06-12 – 2019-06-19 (×19): 25 mg via ORAL
  Filled 2019-06-11 (×24): qty 1

## 2019-06-11 MED ORDER — TRIAMCINOLONE ACETONIDE 0.1 % EX LOTN
1.0000 "application " | TOPICAL_LOTION | Freq: Three times a day (TID) | CUTANEOUS | Status: DC | PRN
  Filled 2019-06-11: qty 60

## 2019-06-11 MED ORDER — ONDANSETRON HCL 4 MG PO TABS: 4.0000 mg | ORAL_TABLET | Freq: Four times a day (QID) | ORAL | Status: DC | PRN

## 2019-06-11 MED ORDER — VANCOMYCIN HCL IN DEXTROSE 1-5 GM/200ML-% IV SOLN: 1000.0000 mg | Freq: Once | INTRAVENOUS | Status: DC

## 2019-06-11 MED ORDER — MONTELUKAST SODIUM 10 MG PO TABS
10.0000 mg | ORAL_TABLET | Freq: Every day | ORAL | Status: DC
  Administered 2019-06-11 – 2019-06-18 (×8): 10 mg via ORAL
  Filled 2019-06-11 (×9): qty 1

## 2019-06-11 MED ORDER — POLYETHYLENE GLYCOL 3350 17 G PO PACK: 17.0000 g | PACK | Freq: Every day | ORAL | Status: DC | PRN

## 2019-06-11 MED ORDER — CALCIUM CARBONATE-VITAMIN D 500-200 MG-UNIT PO TABS
1.0000 | ORAL_TABLET | Freq: Two times a day (BID) | ORAL | Status: DC
  Administered 2019-06-12 – 2019-06-19 (×13): 1 via ORAL
  Filled 2019-06-11 (×20): qty 1

## 2019-06-11 MED ORDER — TRAZODONE HCL 100 MG PO TABS
100.0000 mg | ORAL_TABLET | Freq: Every day | ORAL | Status: DC
  Administered 2019-06-11 – 2019-06-18 (×8): 100 mg via ORAL
  Filled 2019-06-11 (×8): qty 1

## 2019-06-11 MED ORDER — SUCRALFATE 1 G PO TABS
1.0000 g | ORAL_TABLET | Freq: Three times a day (TID) | ORAL | Status: DC
  Administered 2019-06-11 – 2019-06-19 (×23): 1 g via ORAL
  Filled 2019-06-11 (×24): qty 1

## 2019-06-11 MED ORDER — ONDANSETRON HCL 4 MG/2ML IJ SOLN
4.0000 mg | Freq: Four times a day (QID) | INTRAMUSCULAR | Status: DC | PRN
  Administered 2019-06-13 – 2019-06-19 (×13): 4 mg via INTRAVENOUS
  Filled 2019-06-11 (×13): qty 2
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
## 2019-06-11 NOTE — ED Triage Notes (Signed)
Pt has wound to LLE.  Pt showed RN picture and appears infected. Cellulitic type redness around wound and to left calf. No fevers. Did 10 days of doxycycline and has been treated at the wound clinic.  Wound present for 5 weeks. Send from wound clinic for IV abx.  VSS

## 2019-06-11 NOTE — ED Notes (Signed)
First nurse note: pt sent here by Dr for IV antibiotics. The only antibiotic orally she can take for the infection she has, pt has an allergy to.

## 2019-06-11 NOTE — H&P (Addendum)
History and Physical    Ann NEALIS [name]:[id number] DOB: 03-05-1941 DOA: 06/11/2019  PCP: Danella Penton, MD   Patient coming from: Home  I have personally briefly reviewed patient's old medical records in [facility]  Chief Complaint: Infected leg wound  HPI: [name] is a 79 y.o. female with medical history significant of A. fib on Eliquis, hypertension, GERD and anxiety who presented to the ED from the wound care clinic today with infected leg wound despite outpatient treatment with doxycycline.  Patient reports she sustained an injury to her left lower extremity 5 weeks ago.  She was in her rocking chair at the time her cat jumped up on her lap, patient began rocking in the rocking chair but her leg was underneath and the crossbar of the bottom of the rocking chair came down on her leg.  Initially it was only a bruise, this later turned into a large blister, then when the blister opened up had black necrotic tissue.  She was seeing her PCP Dr. Hyacinth Meeker who ultimately sent her to wound care clinic last week.  Wound culture was obtained and grew Enterococcus faecalis.  They started doxycycline and had her follow-up today, and felt it had become more infected, and that the patient was in need of IV antibiotics, so referred her to the ED.  Patient denies any recent fevers or chills, chest pain or shortness of breath, congestion or cough, nausea vomiting or diarrhea.  Patient has pictures of the wound on her phone that we were reviewing together but her cell phone battery died before I could capture them to include here.  ED Course: Vitals within normal limits with the exception of mild hypertension.  Labs were notable for BUN 50 creatinine 1.82 (increased from 1.18 in July 2020), calcium 10.4, MCV 103 without anemia.    COVID-19 PCR test pending (asymptomatic screening, no exposures or symptoms)  Review of Systems: As per HPI otherwise 10 point review of systems negative.    Past Medical  History:  Diagnosis Date  . A-fib (HCC) 2012, 2014  . Anxiety   . Arrhythmia    A-fib  . Bursitis    hips  . Concussion   . Concussion July, 28, 2014  . GERD (gastroesophageal reflux disease)   . Hypertension   . Palpitations   . Pneumonia 2012    Past Surgical History:  Procedure Laterality Date  . ABDOMINAL SURGERY  2008   abdominal muscle mesh insert  . Arm surgery  2015   Pin implanted   . CATARACT EXTRACTION Right August 30, 2013  . CATARACT EXTRACTION Left August 09, 2013  . COLONOSCOPY    . ESOPHAGOGASTRODUODENOSCOPY    . HERNIA REPAIR Left 1970  . HERNIA REPAIR  2015   Dr. Malissa Hippo  . KNEE SURGERY Right 1980  . NOSE SURGERY    . SHOULDER SURGERY Right 2000  . TOTAL ABDOMINAL HYSTERECTOMY  1995     reports that she has never smoked. She has quit using smokeless tobacco. She reports that she does not drink alcohol or use drugs.  Allergies  Allergen Reactions  . Ace Inhibitors Cough  . Albuterol Other (See Comments)    Unknown reaction  . Iodine Hives  . Minocycline Other (See Comments)    Onset 04/10/2001.  / unknown reaction  . Norvasc [Amlodipine Besylate] Hives  . Penicillins Hives    Did it involve swelling of the face/tongue/throat, SOB, or low BP? Unknown Did it  involve sudden or severe rash/hives, skin peeling, or any reaction on the inside of your mouth or nose? Unknown Did you need to seek medical attention at a hospital or doctor's office? Unknown When did it last happen?>36 years ago If all above answers are "NO", may proceed with cephalosporin use.  Marland Kitchen. Propoxyphene Hives    Onset 04/10/2001.   . Sulfa Antibiotics Diarrhea and Nausea Only  . Vicodin [Hydrocodone-Acetaminophen] Hives    Hives occur after 5 doses   . Amlodipine Rash  . Betadine [Povidone Iodine] Rash and Other (See Comments)    blisters  . Clarithromycin Rash    Family History  Adopted: Yes  Problem Relation Age of Onset  . Dementia Neg Hx        ADOPTED      Prior to Admission medications   Medication Sig Start Date End Date Taking? Authorizing Provider  albuterol (VENTOLIN HFA) 108 (90 Base) MCG/ACT inhaler Inhale 1-2 puffs into the lungs every 6 (six) hours as needed for wheezing or shortness of breath.    [provider]  ALPRAZolam Prudy Feeler(XANAX) 0.25 MG tablet Take 0.25 mg by mouth 3 (three) times daily as needed for anxiety. Takes every morning, adds second dose as needed    [provider]  Ascorbic Acid (VITAMIN C) 1000 MG tablet Take 1,000 mg by mouth daily.    [provider]  azelastine (ASTELIN) 137 MCG/SPRAY nasal spray Place 2 sprays into both nostrils daily.     [provider]  baclofen (LIORESAL) 10 MG tablet Take 10 mg by mouth 3 (three) times daily as needed for muscle spasms.     [provider]  beta carotene w/minerals (OCUVITE) tablet Take 1 tablet by mouth 2 (two) times daily.     [provider]  Calcium Carb-Cholecalciferol 600-400 MG-UNIT TABS Take 1 tablet by mouth 2 (two) times daily.    [provider]  carbamazepine (TEGRETOL) 100 MG chewable tablet Chew 100 mg by mouth 2 (two) times daily as needed (trigeminal neuralgia).     [provider]  colchicine 0.6 MG tablet Take 0.6 mg by mouth 2 (two) times daily as needed (gout flare).     [provider]  diclofenac sodium (VOLTAREN) 1 % GEL Apply 1 application topically 2 (two) times daily as needed (foot pain).     [provider]  ELIQUIS 5 MG TABS tablet TAKE 1 TABLET(5 MG) BY MOUTH TWICE DAILY 12/13/18   Antonieta IbaGollan, Timothy J, MD  esomeprazole (NEXIUM) 20 MG packet Take 20 mg by mouth daily before breakfast.     [provider]  estradiol (ESTRACE) 1 MG tablet Take 1.5 mg by mouth daily.     [provider]  flecainide (TAMBOCOR) 100 MG tablet TAKE 1 TABLET BY MOUTH TWICE DAILY 05/06/19   Antonieta IbaGollan, Timothy J, MD  FLUoxetine (PROZAC) 20 MG capsule Take 20 mg by mouth  daily.     [provider]  fluticasone (FLONASE) 50 MCG/ACT nasal spray Place 2 sprays into both nostrils every evening.     [provider]  guaiFENesin-dextromethorphan (ROBITUSSIN DM) 100-10 MG/5ML syrup Take 5 mLs by mouth every 8 (eight) hours as needed for cough.    [provider]  hydrALAZINE (APRESOLINE) 25 MG tablet Take 2 tablets (50 mg total) by mouth 3 (three) times daily. Patient taking differently: Take 25 mg by mouth 3 (three) times daily.  02/06/19 02/06/20  Enedina FinnerPatel, Sona, MD  irbesartan (AVAPRO) 150 MG tablet Take  1 tablet (150 mg total) by mouth daily. Patient taking differently: Take 150 mg by mouth 2 (two) times daily.  02/06/19   Enedina Finner, MD  metoprolol tartrate (LOPRESSOR) 25 MG tablet TAKE 1/2 TABLET(12.5 MG) BY MOUTH TWICE DAILY 04/30/19   Gollan, Tollie Pizza, MD  montelukast (SINGULAIR) 10 MG tablet TAKE 1 TABLET(10 MG) BY MOUTH AT BEDTIME Patient taking differently: Take 10 mg by mouth at bedtime.  11/21/16   Shane Crutch, MD  Multiple Vitamin (MULTIVITAMIN WITH MINERALS) TABS tablet Take 1 tablet by mouth daily with supper.    [provider]  omeprazole (PRILOSEC OTC) 20 MG tablet Take 20 mg by mouth at bedtime.    [provider]  ondansetron (ZOFRAN) 4 MG tablet Take 4 mg by mouth every 6 (six) hours as needed for nausea or vomiting.    [provider]  oxybutynin (DITROPAN) 5 MG tablet Take 5 mg by mouth every evening.  10/01/14   [provider]  pramipexole (MIRAPEX) 0.5 MG tablet Take 0.25 mg by mouth 4 (four) times daily.     [provider]  pyridoxine (B-6) 200 MG tablet Take 200 mg by mouth daily.    [provider]  spironolactone (ALDACTONE) 25 MG tablet Take 12.5 mg by mouth daily as needed (fluid/edema (take with torsemide)).  03/27/18   [provider]  SUMAtriptan (IMITREX) 50 MG tablet Take 50 mg by mouth every 2 (two) hours as needed for migraine.     [provider]  tiZANidine (ZANAFLEX) 4 MG tablet Take 2 mg by mouth 3 (three) times daily as needed for muscle spasms. 01/25/19   [provider]  torsemide (DEMADEX) 20 MG tablet Take 1.5 tablets (30 mg total) by mouth daily as needed (fluid/edema (take with spironolactone)). 02/06/19   Enedina Finner, MD  traMADol (ULTRAM) 50 MG tablet Take 50 mg by mouth 3 (three) times daily as needed (pain).     [provider]  traZODone (DESYREL) 50 MG tablet Take 25 mg by mouth at bedtime.  07/03/12   [provider]  triamcinolone lotion (KENALOG) 0.1 % Apply 1 application topically 3 (three) times daily as needed (itching/rash).    [provider]  vitamin B-12 (CYANOCOBALAMIN) 1000 MCG tablet Take 1,000 mcg by mouth at bedtime.     [provider]    Physical Exam: Vitals:   06/11/19 1355 06/11/19 1416 06/11/19 1805 06/11/19 2050  BP:   (!) 156/69 (!) 141/61  Pulse:   74 75  Resp:  18 17 18   Temp:   98 F (36.7 C)   TempSrc:   Oral   SpO2:   94% 95%  Weight: 90.7 kg     Height: 5\' 6"  (1.676 m)        Constitutional: NAD, calm, comfortable, pleasant and conversational Eyes: EOMI, lids and conjunctivae normal ENMT: Mucous membranes are moist. Normal dentition.  Hearing grossly normal Respiratory: clear to auscultation bilaterally, no wheezing, no crackles. Normal respiratory effort. No accessory muscle use.  Cardiovascular: Regular rate and rhythm, no murmurs / rubs / gallops. No extremity edema.  Abdomen: no tenderness, no masses palpated. No hepatosplenomegaly. Bowel sounds positive.  Musculoskeletal: no clubbing / cyanosis. No joint deformity upper and lower extremities.  Left lower extremity dressing clean dry and intact. Skin: Dry, intact, normal temperature Neurologic: CN 2-12 grossly intact.  Normal speech.  Grossly nonfocal exam Psychiatric: Normal judgment and insight. Alert and oriented x 3. Normal mood.  Labs on Admission: I have  personally reviewed following labs and imaging studies  CBC: Recent Labs  Lab 06/11/19 1420  WBC 9.4  NEUTROABS 7.4  HGB 13.3  HCT 40.7  MCV 103.0*  PLT 540   Basic Metabolic Panel: Recent Labs  Lab 06/11/19 1420  NA 140  K 4.5  CL 101  CO2 25  GLUCOSE 138*  BUN 50*  CREATININE 1.82*  CALCIUM 10.4*   GFR: Estimated Creatinine Clearance: 29.8 mL/min (A) (by C-G formula based on SCr of 1.82 mg/dL (H)). Liver Function Tests: Recent Labs  Lab 06/11/19 1420  AST 22  ALT 21  ALKPHOS 65  BILITOT 0.9  PROT 7.5  ALBUMIN 3.7   No results for input(s): LIPASE, AMYLASE in the last 168 hours. No results for input(s): AMMONIA in the last 168 hours. Coagulation Profile: No results for input(s): INR, PROTIME in the last 168 hours. Cardiac Enzymes: No results for input(s): CKTOTAL, CKMB, CKMBINDEX, TROPONINI in the last 168 hours. BNP (last 3 results) No results for input(s): PROBNP in the last 8760 hours. HbA1C: No results for input(s): HGBA1C in the last 72 hours. CBG: No results for input(s): GLUCAP in the last 168 hours. Lipid Profile: No results for input(s): CHOL, HDL, LDLCALC, TRIG, CHOLHDL, LDLDIRECT in the last 72 hours. Thyroid Function Tests: No results for input(s): TSH, T4TOTAL, FREET4, T3FREE, THYROIDAB in the last 72 hours. Anemia Panel: No results for input(s): VITAMINB12, FOLATE, FERRITIN, TIBC, IRON, RETICCTPCT in the last 72 hours. Urine analysis:    Component Value Date/Time   COLORURINE YELLOW (A) 02/03/2019 1202   APPEARANCEUR CLEAR (A) 02/03/2019 1202   APPEARANCEUR Clear 04/24/2013 0749   LABSPEC 1.012 02/03/2019 1202   LABSPEC 1.006 04/24/2013 0749   PHURINE 5.0 02/03/2019 1202   GLUCOSEU NEGATIVE 02/03/2019 1202   GLUCOSEU Negative 04/24/2013 0749   HGBUR NEGATIVE 02/03/2019 1202   BILIRUBINUR NEGATIVE 02/03/2019 1202   BILIRUBINUR Negative 04/24/2013 0749   KETONESUR NEGATIVE 02/03/2019 1202   PROTEINUR NEGATIVE 02/03/2019 1202    UROBILINOGEN 0.2 08/20/2010 0521   NITRITE NEGATIVE 02/03/2019 1202   LEUKOCYTESUR NEGATIVE 02/03/2019 1202   LEUKOCYTESUR Negative 04/24/2013 0749    Radiological Exams on Admission: DG Tibia/Fibula Left  Result Date: 06/11/2019 CLINICAL DATA:  Wound on LEFT lower extremity, cellulitis, redness around wound at calf EXAM: LEFT TIBIA AND FIBULA - 2 VIEW COMPARISON:  None FINDINGS: Osseous demineralization. Knee and ankle joint alignments normal. No knee joint effusion. Diffuse soft tissue swelling of the LEFT lower leg into ankle especially medially. Dressing artifacts. No acute fracture, dislocation, or bone destruction. IMPRESSION: No acute osseous abnormalities. Electronically Signed   By: Lavonia Dana M.D.   On: 06/11/2019 16:09    EKG: None  Assessment/Plan Principal Problem:   Cellulitis of left lower extremity Active Problems:   AKI (acute kidney injury) ([place])   Hypercalcemia due to a drug   Atrial fibrillation (HCC)   Edema   Hypertension   Anxiety   GERD (gastroesophageal reflux disease)  Cellulitis of left lower extremity -wound culture obtained outpatient at the wound clinic grew Enterococcus faecalis.  Failed outpatient doxycycline.  Vitals within normal limits, no sign of sepsis upon admission. --IV vancomycin --WOC consult --Pain control with Tylenol, tramadol as needed and home Zanaflex  Hypercalcemia - 10.4 on admission.  Most likely due to thiazide diuretic.   --stop metolazone & re-check in 3 months --PTH with AM labs  Acute kidney injury -present on admission.  Creatinine 1.82 on admission (  up from 1.18 in July 2020). Unclear etiology, but differential includes prerenal azotemia from relative dehydration versus reaction to doxycycline. --Gentle IV hydration overnight --BMP in the morning --Further work-up if not improving  Atrial fibrillation -rate controlled --Continue Eliquis, metoprolol, flecainide  Edema  --Hold home Lasix and metolazone due to  AKI --stop metolazone on discharge  Hypertension  --Hold home irbesartan due to AKI --Continue home hydralazine --As needed IV labetalol and or hydralazine   Depression/anxiety --Continue home Xanax and Prozac  GERD (gastroesophageal reflux disease) --Continue home PPI and Carafate  Insomnia --Continue home trazodone  Continue other home medications per orders.   DVT prophylaxis: On Eliquis Code Status: Full  Family Communication: None at bedside.  Patient states that her caregiver, Lestine Box, is also her healthcare power of attorney and surrogate decision-maker.  Her phone number is [phone]. Disposition Plan: Expect discharge home in 24 to 48 hours pending clinical improvement Consults called: Wound care Admission status: Obs   Pennie Banter, DO [facility]   If 7PM-7AM, please contact night-coverage [email or web address]  06/11/2019, 10:05 PM

## 2019-06-11 NOTE — ED Notes (Signed)
Pt transferred to room 35 by this nurse, Pt A&Ox4, NAD noted, VSS. Pt has no requests at this time, is comfortable in bed. Will continue to monitor.

## 2019-06-11 NOTE — Consult Note (Signed)
Pharmacy Antibiotic Note  [name] is a 79 y.o. female admitted on 06/11/2019 with cellulitis on lower left extremity. No numbness or weakness in the leg. Patient reports it started about 5 weeks ago after she sustained a bruise that turned into an infection. Pharmacy has been consulted for Vancomycin dosing. Patient currently has AKI. Will dose based off of random levels until renal function improves/returns to baseline  Plan: Patient to receive Vancomycin 2g IV loading dose in ED.   Random Vancomycin level has been ordered for 06/11/2018@1900 .   Height: 5\' 6"  (167.6 cm) Weight: 200 lb (90.7 kg) IBW/kg (Calculated) : 59.3  Temp (24hrs), Avg:98.2 F (36.8 C), Min:98.2 F (36.8 C), Max:98.2 F (36.8 C)  Recent Labs  Lab 06/11/19 1355 06/11/19 1420 06/11/19 1608  WBC  --  9.4  --   CREATININE  --  1.82*  --   LATICACIDVEN 1.4  --  1.4    Estimated Creatinine Clearance: 29.8 mL/min (A) (by C-G formula based on SCr of 1.82 mg/dL (H)).    Allergies  Allergen Reactions  . Ace Inhibitors Cough  . Albuterol Other (See Comments)    Unknown reaction  . Iodine Hives  . Minocycline Other (See Comments)    Onset 04/10/2001.  / unknown reaction  . Norvasc [Amlodipine Besylate] Hives  . Penicillins Hives    Did it involve swelling of the face/tongue/throat, SOB, or low BP? Unknown Did it involve sudden or severe rash/hives, skin peeling, or any reaction on the inside of your mouth or nose? Unknown Did you need to seek medical attention at a hospital or doctor's office? Unknown When did it last happen?>36 years ago If all above answers are "NO", may proceed with cephalosporin use.  04/12/2001 Propoxyphene Hives    Onset 04/10/2001.   . Sulfa Antibiotics Diarrhea and Nausea Only  . Vicodin [Hydrocodone-Acetaminophen] Hives    Hives occur after 5 doses   . Amlodipine Rash  . Betadine [Povidone Iodine] Rash and Other (See Comments)    blisters  . Clarithromycin Rash     Antimicrobials this admission: Vancomycin 1/19 >>    Microbiology results: 1/19 BCx: pending   Thank you for allowing pharmacy to be a part of this patient's care.  2/19 06/11/2019 5:33 PM

## 2019-06-11 NOTE — ED Notes (Signed)
Patient up to [facility] to void, independent. Dinner tray provided. IP MD now at bedside for eval.

## 2019-06-11 NOTE — Progress Notes (Signed)
PHARMACY -  BRIEF ANTIBIOTIC NOTE   Pharmacy has received consult(s) for Vancomycin from an ED provider.  The patient's profile has been reviewed for ht/wt/allergies/indication/available labs.    One time order(s) placed for Vancomycin 2g IV x 1 dose.  Further antibiotics/pharmacy consults should be ordered by admitting physician if indicated.                       Thank you, Bettey Costa 06/11/2019  3:51 PM

## 2019-06-12 DIAGNOSIS — B952 Enterococcus as the cause of diseases classified elsewhere: Secondary | ICD-10-CM | POA: Diagnosis present

## 2019-06-12 DIAGNOSIS — T502X5A Adverse effect of carbonic-anhydrase inhibitors, benzothiadiazides and other diuretics, initial encounter: Secondary | ICD-10-CM | POA: Diagnosis present

## 2019-06-12 DIAGNOSIS — K5900 Constipation, unspecified: Secondary | ICD-10-CM | POA: Diagnosis present

## 2019-06-12 DIAGNOSIS — Z8619 Personal history of other infectious and parasitic diseases: Secondary | ICD-10-CM | POA: Diagnosis not present

## 2019-06-12 DIAGNOSIS — K219 Gastro-esophageal reflux disease without esophagitis: Secondary | ICD-10-CM | POA: Diagnosis present

## 2019-06-12 DIAGNOSIS — G4700 Insomnia, unspecified: Secondary | ICD-10-CM | POA: Diagnosis present

## 2019-06-12 DIAGNOSIS — Z9842 Cataract extraction status, left eye: Secondary | ICD-10-CM | POA: Diagnosis not present

## 2019-06-12 DIAGNOSIS — R339 Retention of urine, unspecified: Secondary | ICD-10-CM | POA: Diagnosis present

## 2019-06-12 DIAGNOSIS — Z20822 Contact with and (suspected) exposure to covid-19: Secondary | ICD-10-CM | POA: Diagnosis present

## 2019-06-12 DIAGNOSIS — I4891 Unspecified atrial fibrillation: Secondary | ICD-10-CM | POA: Diagnosis present

## 2019-06-12 DIAGNOSIS — N179 Acute kidney failure, unspecified: Secondary | ICD-10-CM | POA: Diagnosis present

## 2019-06-12 DIAGNOSIS — Z9841 Cataract extraction status, right eye: Secondary | ICD-10-CM | POA: Diagnosis not present

## 2019-06-12 DIAGNOSIS — I5032 Chronic diastolic (congestive) heart failure: Secondary | ICD-10-CM | POA: Diagnosis present

## 2019-06-12 DIAGNOSIS — L03116 Cellulitis of left lower limb: Secondary | ICD-10-CM | POA: Diagnosis present

## 2019-06-12 DIAGNOSIS — F419 Anxiety disorder, unspecified: Secondary | ICD-10-CM | POA: Diagnosis present

## 2019-06-12 DIAGNOSIS — I10 Essential (primary) hypertension: Secondary | ICD-10-CM | POA: Diagnosis not present

## 2019-06-12 DIAGNOSIS — T501X5A Adverse effect of loop [high-ceiling] diuretics, initial encounter: Secondary | ICD-10-CM | POA: Diagnosis not present

## 2019-06-12 DIAGNOSIS — Z8701 Personal history of pneumonia (recurrent): Secondary | ICD-10-CM | POA: Diagnosis not present

## 2019-06-12 DIAGNOSIS — I959 Hypotension, unspecified: Secondary | ICD-10-CM | POA: Diagnosis not present

## 2019-06-12 DIAGNOSIS — I440 Atrioventricular block, first degree: Secondary | ICD-10-CM | POA: Diagnosis not present

## 2019-06-12 DIAGNOSIS — F329 Major depressive disorder, single episode, unspecified: Secondary | ICD-10-CM | POA: Diagnosis present

## 2019-06-12 DIAGNOSIS — I110 Hypertensive heart disease with heart failure: Secondary | ICD-10-CM | POA: Diagnosis present

## 2019-06-12 DIAGNOSIS — Z90710 Acquired absence of both cervix and uterus: Secondary | ICD-10-CM | POA: Diagnosis not present

## 2019-06-12 DIAGNOSIS — M109 Gout, unspecified: Secondary | ICD-10-CM | POA: Diagnosis present

## 2019-06-12 LAB — BASIC METABOLIC PANEL
Anion gap: 8 (ref 5–15)
BUN: 44 mg/dL — ABNORMAL HIGH (ref 8–23)
CO2: 23 mmol/L (ref 22–32)
Calcium: 8.9 mg/dL (ref 8.9–10.3)
Chloride: 109 mmol/L (ref 98–111)
Creatinine, Ser: 1.41 mg/dL — ABNORMAL HIGH (ref 0.44–1.00)
GFR calc Af Amer: 42 mL/min — ABNORMAL LOW (ref >60)
GFR calc non Af Amer: 36 mL/min — ABNORMAL LOW (ref >60)
Glucose, Bld: 93 mg/dL (ref 70–99)
Potassium: 4.3 mmol/L (ref 3.5–5.1)
Sodium: 140 mmol/L (ref 135–145)

## 2019-06-12 LAB — MRSA PCR SCREENING: MRSA by PCR: NEGATIVE

## 2019-06-12 LAB — CBC
HCT: 35.9 % — ABNORMAL LOW (ref 36.0–46.0)
Hemoglobin: 11.6 g/dL — ABNORMAL LOW (ref 12.0–15.0)
MCH: 33.6 pg (ref 26.0–34.0)
MCHC: 32.3 g/dL (ref 30.0–36.0)
MCV: 104.1 fL — ABNORMAL HIGH (ref 80.0–100.0)
Platelets: 187 10*3/uL (ref 150–400)
RBC: 3.45 MIL/uL — ABNORMAL LOW (ref 3.87–5.11)
RDW: 13 % (ref 11.5–15.5)
WBC: 5.2 10*3/uL (ref 4.0–10.5)
nRBC: 0 % (ref 0.0–0.2)

## 2019-06-12 LAB — SARS CORONAVIRUS 2 (TAT 6-24 HRS): SARS Coronavirus 2: NEGATIVE

## 2019-06-12 LAB — VANCOMYCIN, RANDOM: Vancomycin Rm: 11

## 2019-06-12 MED ORDER — VANCOMYCIN HCL 1500 MG/300ML IV SOLN
1500.0000 mg | Freq: Once | INTRAVENOUS | Status: AC
  Administered 2019-06-12: 1500 mg via INTRAVENOUS
  Filled 2019-06-12: qty 300

## 2019-06-12 MED ORDER — MORPHINE SULFATE (PF) 2 MG/ML IV SOLN
2.0000 mg | INTRAVENOUS | Status: DC | PRN
  Administered 2019-06-12 – 2019-06-17 (×10): 2 mg via INTRAVENOUS
  Filled 2019-06-12 (×10): qty 1

## 2019-06-12 MED ORDER — METOPROLOL TARTRATE 25 MG PO TABS
12.5000 mg | ORAL_TABLET | Freq: Two times a day (BID) | ORAL | Status: DC
  Administered 2019-06-12 – 2019-06-19 (×13): 12.5 mg via ORAL
  Filled 2019-06-12 (×16): qty 1

## 2019-06-12 MED ORDER — SODIUM CHLORIDE 0.9 % IV BOLUS
500.0000 mL | Freq: Once | INTRAVENOUS | Status: AC
  Administered 2019-06-12: 500 mL via INTRAVENOUS

## 2019-06-12 NOTE — Progress Notes (Signed)
Drennan, Cheryle B. ([id number]) Visit Report for 06/11/2019 Arrival Information Details Patient Name: [name], [name] Date of Service: 06/11/2019 1:00 PM Medical Record Number: [id number] Patient Account Number: [id number] Date of Birth/Sex: [birth year] (79 y.o. F) Treating RN: [name] Primary Care Tenishia Ekman: [name] Other Clinician: Referring Jnae Thomaston: [name] Treating Cinzia Devos/Extender: [name], [name] Weeks in Treatment: 0 Visit Information History Since Last Visit Has Dressing in Place as Prescribed: Yes Patient Arrived: Ambulatory Has Compression in Place as Prescribed: Yes Arrival Time: 12:58 Pain Present Now: Yes Accompanied By: friend Transfer Assistance: None Patient Identification Verified: Yes Secondary Verification Process Completed: Yes Electronic Signature(s) Signed: 06/12/2019 3:29:52 PM By: [name], [name], RN, [name], [name] RN, [name] Entered By: [name], [name], RN, [name], [name] on 06/11/2019 12:59:30 Weich, Kinsly B. ([id number]) -------------------------------------------------------------------------------- Clinic Level of Care Assessment Details Patient Name: [name], [name] Date of Service: 06/11/2019 1:00 PM Medical Record Number: [id number] Patient Account Number: [id number] Date of Birth/Sex: February 26, 1941 (79 y.o. F) Treating RN: [name] Primary Care Jahron Hunsinger: [name] Other Clinician: Referring Rock Sobol: [name] Treating Jia Dottavio/Extender: [name], [name] Weeks in Treatment: 0 Clinic Level of Care Assessment Items TOOL 4 Quantity Score []  - Use when only an EandM is performed on FOLLOW-UP visit 0 ASSESSMENTS - Nursing Assessment / Reassessment X - Reassessment of Co-morbidities (includes updates in patient status) 1 10 X- 1 5 Reassessment of Adherence to Treatment Plan ASSESSMENTS - Wound and Skin Assessment / Reassessment X - Simple Wound Assessment / Reassessment - one wound 1 5 []  - 0 Complex Wound Assessment / Reassessment - multiple wounds []  - 0 Dermatologic  / Skin Assessment (not related to wound area) ASSESSMENTS - Focused Assessment []  - Circumferential Edema Measurements - multi extremities 0 []  - 0 Nutritional Assessment / Counseling / Intervention []  - 0 Lower Extremity Assessment (monofilament, tuning fork, pulses) []  - 0 Peripheral Arterial Disease Assessment (using hand held doppler) ASSESSMENTS - Ostomy and/or Continence Assessment and Care []  - Incontinence Assessment and Management 0 []  - 0 Ostomy Care Assessment and Management (repouching, etc.) PROCESS - Coordination of Care X - Simple Patient / Family Education for ongoing care 1 15 []  - 0 Complex (extensive) Patient / Family Education for ongoing care []  - 0 Staff obtains , Records, Test Results / Process Orders []  - 0 Staff telephones HHA, Nursing Homes / Clarify orders / etc []  - 0 Routine Transfer to another Facility (non-emergent condition) []  - 0 Routine Hospital Admission (non-emergent condition) []  - 0 New Admissions / / Ordering NPWT, Apligraf, etc. []  - 0 Emergency Hospital Admission (emergent condition) X- 1 10 Simple Discharge Coordination Renegar, [name] ( ) []  - 0 Complex (extensive) Discharge Coordination PROCESS - Special Needs []  - Pediatric / Minor Patient Management 0 []  - 0 Isolation Patient Management []  - 0 Hearing / Language / Visual special needs []  - 0 Assessment of Community assistance (transportation, D/C planning, etc.) []  - 0 Additional assistance / Altered mentation []  - 0 Support Surface(s) Assessment (bed, cushion, seat, etc.) INTERVENTIONS - Wound Cleansing / Measurement X - Simple Wound Cleansing - one wound 1 5 []  - 0 Complex Wound Cleansing - multiple wounds X- 1 5 Wound Imaging (photographs - any number of wounds) []  - 0 Wound Tracing (instead of photographs) X- 1 5 Simple Wound Measurement - one wound []  - 0 Complex Wound Measurement - multiple wounds INTERVENTIONS - Wound  Dressings []  - Small Wound Dressing one or multiple wounds 0 X- 1 15  Medium Wound Dressing one or multiple wounds []  - 0 Large Wound Dressing one or multiple wounds []  - 0 Application of Medications - topical []  - 0 Application of Medications - injection INTERVENTIONS - Miscellaneous []  - External ear exam 0 []  - 0 Specimen Collection (cultures, biopsies, blood, body fluids, etc.) []  - 0 Specimen(s) / Culture(s) sent or taken to Lab for analysis []  - 0 Patient Transfer (multiple staff / Civil Service fast streamer / Similar devices) []  - 0 Simple Staple / Suture removal (25 or less) []  - 0 Complex Staple / Suture removal (26 or more) []  - 0 Hypo / Hyperglycemic Management (close monitor of Blood Glucose) []  - 0 Ankle / Brachial Index (ABI) - do not check if billed separately X- 1 5 Vital Signs Macmullen, [name] ([id number]) Has the patient been seen at the hospital within the last three years: Yes Total Score: 80 Level Of Care: New/Established - Level 3 Electronic Signature(s) Signed: 06/12/2019 3:29:52 PM By: Gretta Cool, [name], RN, [name], [name] RN, [name] Entered By: Gretta Cool, [name], RN, [name], [name] on 06/11/2019 13:04:38 [place], Shakoya B. ([id number]) -------------------------------------------------------------------------------- Encounter Discharge Information Details Patient Name: [name], [name] Date of Service: 06/11/2019 1:00 PM Medical Record Number: [id number] Patient Account Number: [id number] Date of Birth/Sex: 1940-05-27 (79 y.o. F) Treating RN: Cornell Barman Primary Care Arlind Klingerman: Emily Filbert Other Clinician: Referring Declan Adamson: Emily Filbert Treating [place] Mielke/Extender: Melburn Hake, [name] Weeks in Treatment: 0 Encounter Discharge Information Items Discharge Condition: Stable Ambulatory Status: Ambulatory Discharge Destination: Hospital Telephoned: Yes Orders Sent: No Transportation: Private Auto Accompanied By: friend Schedule Follow-up Appointment: Yes Clinical Summary of Care: Electronic  Signature(s) Signed: 06/12/2019 3:29:52 PM By: Gretta Cool, [name], RN, [name], [name] RN, [name] Entered By: Gretta Cool, [name], RN, [name], [name] on 06/11/2019 13:04:06 Taylor, [place]. ([id number]) -------------------------------------------------------------------------------- Pain Assessment Details Patient Name: Dimon, Anaija B. Date of Service: 06/11/2019 1:00 PM Medical Record Number: [id number] Patient Account Number: [id number] Date of Birth/Sex: 1940/07/09 (78 y.o. F) Treating RN: Cornell Barman Primary Care Achol Azpeitia: Emily Filbert Other Clinician: Referring Demetres Prochnow: Emily Filbert Treating Amilio Zehnder/Extender: Melburn Hake, [name] Weeks in Treatment: 0 Active Problems Location of Pain Severity and Description of Pain Patient Has Paino Yes Site Locations With Dressing Change: Yes Rate the pain. Current Pain Level: 4 Worst Pain Level: 6 Pain Management and Medication Current Pain Management: Electronic Signature(s) Signed: 06/12/2019 3:29:52 PM By: Gretta Cool, [name], RN, [name], [name] RN, [name] Entered By: Gretta Cool, [name], RN, [name], [name] on 06/11/2019 13:00:18 [place], Chamberino B. ([id number]) -------------------------------------------------------------------------------- Wound Assessment Details Patient Name: Kelsay, Aylana B. Date of Service: 06/11/2019 1:00 PM Medical Record Number: [id number] Patient Account Number: [id number] Date of Birth/Sex: 04/13/1941 (78 y.o. F) Treating RN: Cornell Barman Primary Care Emoree Sasaki: Emily Filbert Other Clinician: Referring Aurilla Coulibaly: Emily Filbert Treating Oreatha Fabry/Extender: Melburn Hake, [name] Weeks in Treatment: 0 Wound Status Wound Number: 1 Primary Trauma, Other Etiology: Wound Location: Left Lower Leg - Posterior Wound Open Wounding Event: Trauma Status: Date Acquired: 04/23/2019 Comorbid Cataracts, Arrhythmia, Congestive Heart Weeks Of Treatment: 0 History: Failure, Hypertension, Gout Clustered Wound: No Photos Wound Measurements Length: (cm) 4 % Reduction Width: (cm) 5 % Reduction Depth: (cm) 0.2  Epithelializ Area: (cm) 15.708 Tunneling: Volume: (cm) 3.142 Undermining in Area: -8.4% in Volume: -116.8% ation: None No : No Wound Description Classification: Unclassifiable Foul Odor A Wound Margin: Flat and Intact Slough/Fibr Exudate Amount: Medium Exudate Type: Serosanguineous Exudate Color: red, brown fter Cleansing: No ino Yes Wound Bed Granulation Amount: None Present (0%) Exposed Structure Necrotic Amount: Large (67-100%) Fascia Exposed:  No Necrotic Quality: Eschar, Adherent Slough Fat Layer (Subcutaneous Tissue) Exposed: Yes Tendon Exposed: No Muscle Exposed: No Joint Exposed: No Bone Exposed: No Treatment Notes Wassmer, Jeylin B. ([id number]) Wound #1 (Left, Posterior Lower Leg) Notes silvercel, xtrasorb, abd and conform with tubi F Electronic Signature(s) Signed: 06/12/2019 3:29:52 PM By: [name], [name], RN, [name], [name] RN, [name] Entered By: [name], [name], RN, [name], [name] on 06/11/2019 13:02:10

## 2019-06-12 NOTE — Consult Note (Signed)
WOC Nurse Consult Note: Reason for Consult: LE wound Patient self reports trauma several months ago; non healing ulcer LLE. Starting being followed in the wound care center about 2 weeks ago.  Wound type: full thickness ulceration; non healing; in the presence of cellulitis Pressure Injury POA: NA Measurement: 3.5cm x 3.0cm x 0.3cm  Wound bed:100% yellow fibrinous Drainage (amount, consistency, odor) minimal  Periwound: erythema  Dressing procedure/placement/frequency: After discussion with patient the POC from the wound care center sounds reasonable.  The wound needs debridement of some type; I was going to initiate this but after discussion that the MD plans to start serial debridements after the IV antibiotics we will continue with the same POC as she has in place now.   Cut to fit silver hydrofiber and place over the wound, top with silicone foam. Conform/or kerlix. Top with patient's Tubigrip.  Change every 2 days.   Discussed POC with patient and bedside nurse.  Re consult if needed, will not follow at this time. Thanks  Chabely Norby [employer], RN,CWOCN, CNS, CWON-AP [phone])

## 2019-06-12 NOTE — Progress Notes (Signed)
Patient refused measurements of wound due to it being painful to the touch. Wound care consult placed. Arturo Morton

## 2019-06-12 NOTE — Consult Note (Signed)
Pharmacy Antibiotic Note  [name] is a 79 y.o. female admitted on 06/11/2019 with cellulitis on lower left extremity. No numbness or weakness in the leg. Patient reports it started about 5 weeks ago after she sustained a bruise that turned into an infection. Pharmacy has been consulted for Vancomycin dosing. Patient currently has AKI. Will dose based off of random levels until renal function improves/returns to baseline  Plan: Patients renal function has improved, but still has AKI and will continue to dose off of random levels.  Will order Vancomycin 1500mg  IV x 1  Random Vancomycin level has been ordered for 06/12/2018@2000 .   Height: 5\' 6"  (167.6 cm) Weight: 202 lb 6.4 oz (91.8 kg) IBW/kg (Calculated) : 59.3  Temp (24hrs), Avg:97.9 F (36.6 C), Min:97.7 F (36.5 C), Max:98.1 F (36.7 C)  Recent Labs  Lab 06/11/19 1355 06/11/19 1420 06/11/19 1608 06/11/19 2130 06/12/19 0456 06/12/19 1758  WBC  --  9.4  --   --  5.2  --   CREATININE  --  1.82*  --   --  1.41*  --   LATICACIDVEN 1.4  --  1.4  --   --   --   VANCORANDOM  --   --   --  39  --  11    Estimated Creatinine Clearance: 38.7 mL/min (A) (by C-G formula based on SCr of 1.41 mg/dL (H)).    Allergies  Allergen Reactions  . Ace Inhibitors Cough  . Albuterol Other (See Comments)    Unknown reaction  . Iodine Hives  . Minocycline Other (See Comments)    Onset 04/10/2001.  / unknown reaction  . Norvasc [Amlodipine Besylate] Hives  . Penicillins Hives    Did it involve swelling of the face/tongue/throat, SOB, or low BP? Unknown Did it involve sudden or severe rash/hives, skin peeling, or any reaction on the inside of your mouth or nose? Unknown Did you need to seek medical attention at a hospital or doctor's office? Unknown When did it last happen?>36 years ago If all above answers are "NO", may proceed with cephalosporin use.  06/14/19 Propoxyphene Hives    Onset 04/10/2001.   . Sulfa Antibiotics Diarrhea and  Nausea Only  . Vicodin [Hydrocodone-Acetaminophen] Hives    Hives occur after 5 doses   . Amlodipine Rash  . Betadine [Povidone Iodine] Rash and Other (See Comments)    blisters  . Clarithromycin Rash    Antimicrobials this admission: Vancomycin 1/19 >>    Microbiology results: 1/19 BCx: pending   Thank you for allowing pharmacy to be a part of this patient's care.  2/19 06/12/2019 7:40 PM

## 2019-06-12 NOTE — Progress Notes (Addendum)
Called from bedside RN, patient with systolic pressures in 50's and heart rate in 40's.  Bedside patient was sleeping but awakened easily.  Denied any chest pain, dizziness or any other associated symptoms.   In discussion with patient regarding her normal BP and HR. She did state she has been having medicine changes by PCP related to her BP being lower.  They have recently added/adjusted her diuretic therapy and she had taken them this morning with good effect as she noted having large volume output with voids.  She also stated she has been taken off her ARB with addition of spironaldactone to her regimen.  EKG sinus bradycardia with first degree AVB.  Total of 1 liter normal saline bolus bolus was given to treat her hypotension and  Bradycardia and AKI (creatinine 1.82, baseline appears to be around 0.8 to 1.2).  Diuretics stopped for now (dose lasix at 1915 this evening after taking morning diuretics was noted ) and parameters put on metoprolol.  She remains on flecainide and eliquis for control of her atrial fib.

## 2019-06-12 NOTE — Plan of Care (Signed)
Patient is alert and oriented with cellulitis and infected wound to LLE. It is wrapped with gauze and painful to the touch.  Will continue to monitor.  [name]

## 2019-06-12 NOTE — Consult Note (Signed)
I have placed a request via Secure Chat to Dr. Griffith requesting photos of the wound areas of concern to be placed in the EMR.    Ebon Ketchum MSN,RN,CWOCN, CNS, CWON-AP [phone]  

## 2019-06-12 NOTE — ED Notes (Signed)
New order received from [facility] NP. 500 ml bolus started and will recheck bp. Pt has not received any diuretics tonight. Pt did receive 12.5 metoprolol 2.5 hours ago. Pt is alert and oriented.

## 2019-06-12 NOTE — Progress Notes (Signed)
PROGRESS NOTE    [name]  UJW:[id number] DOB: 08-31-1940 DOA: 06/11/2019  PCP: Danella Penton, MD    LOS - 0   Brief Narrative:  79 y.o. female with medical history significant of A. fib on Eliquis, hypertension, GERD and anxiety who presented to the ED from the wound care clinic on 1/19 with infected leg wound despite outpatient treatment with doxycycline.  Wound present for 5 weeks, PCP sent to wound clinic last week since worsening.  Wound culture was obtained and grew Enterococcus faecalis, started on Doxy.  At follow up visit, wound infection had progressed, patient referred to ED for admission and IV antibiotics.  Started on IV Vancomycin and admitted to hospitalist service with wound care consult.   Subjective 1/20: Patient has no complaints this AM except that her leg is very painful.  Needs Zanaflex which helps as much of the pain is due to muscle spasms in the leg.  No fever/chills or other new complaints.  Overnight, patient had low BP and bradycardia.  Was given 1 L bolus fluids, both resolved.  Assessment & Plan:   Principal Problem:   Cellulitis of left lower extremity Active Problems:   AKI (acute kidney injury) (HCC)   Hypercalcemia due to a drug   Atrial fibrillation (HCC)   Edema   Hypertension   Anxiety   GERD (gastroesophageal reflux disease)   Cellulitis of left lower extremity -wound culture obtained outpatient at the wound clinic grew Enterococcus faecalis.  Failed outpatient doxycycline.  Vitals within normal limits, no sign of sepsis upon admission. --IV vancomycin --WOC consult --Pain control with Tylenol, tramadol as needed and home Zanaflex  Hypercalcemia - 10.4 on admission.  Most likely due to thiazide diuretic.   --stop metolazone & re-check in 3 months --PTH with AM labs  Acute kidney injury -present on admission.  Creatinine 1.82 on admission (up from 1.18 in July 2020). Unclear etiology, but differential includes prerenal azotemia from  relative dehydration versus reaction to doxycycline. --Gentle IV hydration overnight --BMP in the morning --Further work-up if not improving  Atrial fibrillation -rate controlled --Continue Eliquis, metoprolol, flecainide  Edema  --Hold home Lasix and metolazone due to AKI --stop metolazone on discharge due to hypercalcemia  Hypertension  --per patient, taken off ARB, started on spironolactone recently --hold spironolactone due to AKI --Continue home hydralazine --As needed IV labetalol and or hydralazine   Depression/anxiety --Continue home Xanax and Prozac  GERD (gastroesophageal reflux disease) --Continue home PPI and Carafate  Insomnia --Continue home trazodone     Objective: Vitals:   06/12/19 0618 06/12/19 0620 06/12/19 0949 06/12/19 1259  BP: 109/62  120/65 118/67  Pulse: 62 (!) 56 73 63  Resp: 20   15  Temp: 97.8 F (36.6 C)   98.1 F (36.7 C)  TempSrc: Oral   Oral  SpO2: (!) 87% 92% 96% 96%  Weight:      Height:        Intake/Output Summary (Last 24 hours) at 06/12/2019 1608 Last data filed at 06/12/2019 1345 Gross per 24 hour  Intake 2508.48 ml  Output 1300 ml  Net 1208.48 ml   Filed Weights   06/11/19 1355 06/12/19 0228  Weight: 90.7 kg 91.8 kg    Examination:  General exam: awake, alert, no acute distress Respiratory system: clear to auscultation bilaterally, no wheezes, rales or rhonchi, normal respiratory effort. Cardiovascular system: normal S1/S2, RRR, no JVD, murmurs, rubs, gallops, trace pitting edema of bilateral lower extremities.   Central  nervous system: alert and oriented x4. no gross focal neurologic deficits, normal speech Extremities: left lower extremity with dressing clean dry intact, no cyanosis, normal tone Skin: dry, intact, normal temperature Psychiatry: normal mood, congruent affect, judgement and insight appear normal          Data Reviewed: I have personally reviewed following labs and imaging  studies  CBC: Recent Labs  Lab 06/11/19 1420 06/12/19 0456  WBC 9.4 5.2  NEUTROABS 7.4  --   HGB 13.3 11.6*  HCT 40.7 35.9*  MCV 103.0* 104.1*  PLT 263 187   Basic Metabolic Panel: Recent Labs  Lab 06/11/19 1420 06/12/19 0456  NA 140 140  K 4.5 4.3  CL 101 109  CO2 25 23  GLUCOSE 138* 93  BUN 50* 44*  CREATININE 1.82* 1.41*  CALCIUM 10.4* 8.9   GFR: Estimated Creatinine Clearance: 38.7 mL/min (A) (by C-G formula based on SCr of 1.41 mg/dL (H)). Liver Function Tests: Recent Labs  Lab 06/11/19 1420  AST 22  ALT 21  ALKPHOS 65  BILITOT 0.9  PROT 7.5  ALBUMIN 3.7   No results for input(s): LIPASE, AMYLASE in the last 168 hours. No results for input(s): AMMONIA in the last 168 hours. Coagulation Profile: No results for input(s): INR, PROTIME in the last 168 hours. Cardiac Enzymes: No results for input(s): CKTOTAL, CKMB, CKMBINDEX, TROPONINI in the last 168 hours. BNP (last 3 results) No results for input(s): PROBNP in the last 8760 hours. HbA1C: No results for input(s): HGBA1C in the last 72 hours. CBG: No results for input(s): GLUCAP in the last 168 hours. Lipid Profile: No results for input(s): CHOL, HDL, LDLCALC, TRIG, CHOLHDL, LDLDIRECT in the last 72 hours. Thyroid Function Tests: No results for input(s): TSH, T4TOTAL, FREET4, T3FREE, THYROIDAB in the last 72 hours. Anemia Panel: No results for input(s): VITAMINB12, FOLATE, FERRITIN, TIBC, IRON, RETICCTPCT in the last 72 hours. Sepsis Labs: Recent Labs  Lab 06/11/19 1355 06/11/19 1608  LATICACIDVEN 1.4 1.4    Recent Results (from the past 240 hour(s))  Aerobic Culture (superficial specimen)     Status: None   Collection Time: 06/07/19 10:00 AM   Specimen: Leg  Result Value Ref Range Status   Specimen Description   Final    LEG LOWER LEFT LEG Performed at [facility], [address], [place], [place] [address]    Special Requests   Final    NONE Performed at [facility], [address] [place], [place] [address]    Gram Stain   Final    RARE WBC PRESENT, PREDOMINANTLY PMN ABUNDANT GRAM POSITIVE COCCI MODERATE YEAST Performed at [facility], [address], [place], [place] [address]    Culture   Final    ABUNDANT ENTEROCOCCUS FAECALIS MODERATE CANDIDA ALBICANS    Report Status 06/10/2019 FINAL  Final   Organism ID, Bacteria ENTEROCOCCUS FAECALIS  Final      Susceptibility   Enterococcus faecalis - MIC*    AMPICILLIN <=2 SENSITIVE Sensitive     VANCOMYCIN 1 SENSITIVE Sensitive     GENTAMICIN SYNERGY SENSITIVE Sensitive     * ABUNDANT ENTEROCOCCUS FAECALIS  Blood Culture (routine x 2)     Status: None (Preliminary result)   Collection Time: 06/11/19  4:07 PM   Specimen: BLOOD  Result Value Ref Range Status   Specimen Description BLOOD RIGHT ANTECUBITAL  Final   Special Requests   Final    BOTTLES DRAWN AEROBIC AND ANAEROBIC Blood Culture  adequate volume   Culture   Final    NO GROWTH < 24 HOURS Performed at [facility], [address] [place], [place] [address]    Report Status PENDING  Incomplete  Blood Culture (routine x 2)     Status: None (Preliminary result)   Collection Time: 06/11/19  4:08 PM   Specimen: BLOOD  Result Value Ref Range Status   Specimen Description BLOOD LEFT ANTECUBITAL  Final   Special Requests   Final    BOTTLES DRAWN AEROBIC AND ANAEROBIC Blood Culture adequate volume   Culture   Final    NO GROWTH < 24 HOURS Performed at [facility], [address] [place], [place] [address]    Report Status PENDING  Incomplete  SARS CORONAVIRUS 2 (TAT 6-24 HRS) Nasopharyngeal Nasopharyngeal Swab     Status: None   Collection Time: 06/11/19  5:05 PM   Specimen: Nasopharyngeal Swab  Result Value Ref Range Status   SARS Coronavirus 2 NEGATIVE NEGATIVE Final    Comment: (NOTE) SARS-CoV-2 target nucleic acids are NOT DETECTED. The SARS-CoV-2 RNA is generally detectable in upper and  lower respiratory specimens during the acute phase of infection. Negative results do not preclude SARS-CoV-2 infection, do not rule out co-infections with other pathogens, and should not be used as the sole basis for treatment or other patient management decisions. Negative results must be combined with clinical observations, patient history, and epidemiological information. The expected result is Negative. Fact Sheet for Patients: HairSlick.no Fact Sheet for Healthcare Providers: quierodirigir.com This test is not yet approved or cleared by the [place] FDA and  has been authorized for detection and/or diagnosis of SARS-CoV-2 by FDA under an Emergency Use Authorization (EUA). This EUA will remain  in effect (meaning this test can be used) for the duration of the COVID-19 declaration under Section 56 4(b)(1) of the Act, 21 U.S.C. section 360bbb-3(b)(1), unless the authorization is terminated or revoked sooner. Performed at [facility], [address], [place], [place] [address]   MRSA PCR Screening     Status: None   Collection Time: 06/12/19  3:14 AM   Specimen: Nasopharyngeal  Result Value Ref Range Status   MRSA by PCR NEGATIVE NEGATIVE Final    Comment:        The GeneXpert MRSA Assay (FDA approved for NASAL specimens only), is one component of a comprehensive MRSA colonization surveillance program. It is not intended to diagnose MRSA infection nor to guide or monitor treatment for MRSA infections. Performed at [facility], [address], [place], [place] [address]          Radiology Studies: DG Tibia/Fibula Left  Result Date: 06/11/2019 CLINICAL DATA:  Wound on LEFT lower extremity, cellulitis, redness around wound at calf EXAM: LEFT TIBIA AND FIBULA - 2 VIEW COMPARISON:  None FINDINGS: Osseous demineralization. Knee and ankle joint alignments normal. No knee joint effusion. Diffuse soft  tissue swelling of the LEFT lower leg into ankle especially medially. Dressing artifacts. No acute fracture, dislocation, or bone destruction. IMPRESSION: No acute osseous abnormalities. Electronically Signed   By: Ulyses Southward M.D.   On: 06/11/2019 16:09        Scheduled Meds: . apixaban  5 mg Oral BID  . vitamin C  1,000 mg Oral Daily  . calcium-vitamin D  1 tablet Oral BID  . estradiol  1.5 mg Oral Daily  . flecainide  100 mg Oral BID  . FLUoxetine  20 mg Oral  Daily  . fluticasone  2 spray Each Nare QPM  . hydrALAZINE  25 mg Oral TID  . metoprolol tartrate  12.5 mg Oral BID  . montelukast  10 mg Oral QHS  . oxybutynin  5 mg Oral QPM  . pantoprazole  40 mg Oral Daily  . pramipexole  0.25 mg Oral QID  . pyridoxine  200 mg Oral Daily  . sucralfate  1 g Oral TID  . traZODone  100 mg Oral QHS  . vancomycin variable dose per unstable renal function (pharmacist dosing)   Does not apply See admin instructions   Continuous Infusions: . sodium chloride 75 mL/hr at 06/12/19 1002     LOS: 0 days    Time spent: 35 minutes    Ezekiel Slocumb, DO [facility]   If 7PM-7AM, please contact night-coverage [email or web address] 06/12/2019, 4:08 PM

## 2019-06-13 DIAGNOSIS — T50905A Adverse effect of unspecified drugs, medicaments and biological substances, initial encounter: Secondary | ICD-10-CM

## 2019-06-13 LAB — CBC WITH DIFFERENTIAL/PLATELET
Abs Immature Granulocytes: 0.07 10*3/uL (ref 0.00–0.07)
Basophils Absolute: 0 10*3/uL (ref 0.0–0.1)
Basophils Relative: 1 %
Eosinophils Absolute: 0.3 10*3/uL (ref 0.0–0.5)
Eosinophils Relative: 5 %
HCT: 35.4 % — ABNORMAL LOW (ref 36.0–46.0)
Hemoglobin: 11.6 g/dL — ABNORMAL LOW (ref 12.0–15.0)
Immature Granulocytes: 1 %
Lymphocytes Relative: 17 %
Lymphs Abs: 1.1 10*3/uL (ref 0.7–4.0)
MCH: 33.8 pg (ref 26.0–34.0)
MCHC: 32.8 g/dL (ref 30.0–36.0)
MCV: 103.2 fL — ABNORMAL HIGH (ref 80.0–100.0)
Monocytes Absolute: 0.5 10*3/uL (ref 0.1–1.0)
Monocytes Relative: 7 %
Neutro Abs: 4.6 10*3/uL (ref 1.7–7.7)
Neutrophils Relative %: 69 %
Platelets: 207 10*3/uL (ref 150–400)
RBC: 3.43 MIL/uL — ABNORMAL LOW (ref 3.87–5.11)
RDW: 12.9 % (ref 11.5–15.5)
WBC: 6.5 10*3/uL (ref 4.0–10.5)
nRBC: 0 % (ref 0.0–0.2)

## 2019-06-13 LAB — BASIC METABOLIC PANEL
Anion gap: 9 (ref 5–15)
BUN: 25 mg/dL — ABNORMAL HIGH (ref 8–23)
CO2: 20 mmol/L — ABNORMAL LOW (ref 22–32)
Calcium: 9.6 mg/dL (ref 8.9–10.3)
Chloride: 107 mmol/L (ref 98–111)
Creatinine, Ser: 0.87 mg/dL (ref 0.44–1.00)
GFR calc Af Amer: >60 mL/min (ref >60)
GFR calc non Af Amer: >60 mL/min (ref >60)
Glucose, Bld: 96 mg/dL (ref 70–99)
Potassium: 4.2 mmol/L (ref 3.5–5.1)
Sodium: 136 mmol/L (ref 135–145)

## 2019-06-13 LAB — MAGNESIUM: Magnesium: 1.4 mg/dL — ABNORMAL LOW (ref 1.7–2.4)

## 2019-06-13 MED ORDER — MAGNESIUM SULFATE 2 GM/50ML IV SOLN
2.0000 g | Freq: Once | INTRAVENOUS | Status: AC
  Administered 2019-06-13: 14:00:00 2 g via INTRAVENOUS
  Filled 2019-06-13: qty 50

## 2019-06-13 MED ORDER — VANCOMYCIN HCL IN DEXTROSE 1-5 GM/200ML-% IV SOLN
1000.0000 mg | INTRAVENOUS | Status: AC
  Administered 2019-06-13: 19:00:00 1000 mg via INTRAVENOUS
  Filled 2019-06-13 (×2): qty 200

## 2019-06-13 NOTE — Progress Notes (Signed)
PROGRESS NOTE    [name]  MAU:[id number] DOB: August 28, 1940 DOA: 06/11/2019  PCP: Danella Penton, MD    LOS - 1   Brief Narrative:  79 y.o. female with medical history significant of A. fib on Eliquis, hypertension, GERD and anxiety who presented to the ED from the wound care clinic on 1/19 with infected leg wound despite outpatient treatment with doxycycline.  Wound present for 5 weeks, PCP sent to wound clinic last week since worsening.  Wound culture was obtained and grew Enterococcus faecalis, started on Doxy.  At follow up visit, wound infection had progressed, patient referred to ED for admission and IV antibiotics.  Started on IV Vancomycin and admitted to hospitalist service with wound care consult.   Subjective 1/20: Leg is less painful.  Does not feel as warm.  Feels that cellulitis is improving.  Assessment & Plan:   Principal Problem:   Cellulitis of left lower extremity Active Problems:   Atrial fibrillation (HCC)   Edema   Hypertension   Anxiety   GERD (gastroesophageal reflux disease)   AKI (acute kidney injury) (HCC)   Hypercalcemia due to a drug   Cellulitis of left lower extremity -wound culture obtained outpatient at the wound clinic grew Enterococcus faecalis.  Failed outpatient doxycycline.  Vitals within normal limits, no sign of sepsis upon admission. --IV vancomycin --WOC consult --Pain control with Tylenol, tramadol as needed and home Zanaflex -Clinically, slowly improving -Anticipate transitioning to oral Augmentin in the next 24 hours -Will need to follow-up at wound care center.  Hypercalcemia - 10.4 on admission.  Most likely due to thiazide diuretic.   -Improved with hydration --stop metolazone & re-check in 3 months --PTH with AM labs  Acute kidney injury -present on admission.  Creatinine 1.82 on admission (up from 1.18 in July 2020). Unclear etiology, but differential includes prerenal azotemia from relative dehydration versus reaction  to doxycycline. --Gentle IV hydration overnight --BMP in the morning --Further work-up if not improving  Atrial fibrillation -rate controlled --Continue Eliquis, metoprolol, flecainide  Edema  --Hold home Lasix and metolazone due to AKI --stop metolazone on discharge due to hypercalcemia  Hypertension  --per patient, taken off ARB, started on spironolactone recently --hold spironolactone due to AKI --Continue home hydralazine --As needed IV labetalol and or hydralazine   Depression/anxiety --Continue home Xanax and Prozac  GERD (gastroesophageal reflux disease) --Continue home PPI and Carafate  Insomnia --Continue home trazodone     Objective: Vitals:   06/12/19 2023 06/13/19 0442 06/13/19 0931 06/13/19 1350  BP: (!) 127/51 (!) 153/61 (!) 144/69 (!) 137/57  Pulse: 72 80 69 68  Resp: 20 16  18   Temp: (!) 97.3 F (36.3 C) 98.1 F (36.7 C)  98.3 F (36.8 C)  TempSrc: Oral Oral  Oral  SpO2: 97% 94%  94%  Weight:      Height:        Intake/Output Summary (Last 24 hours) at 06/13/2019 1924 Last data filed at 06/13/2019 1909 Gross per 24 hour  Intake 480 ml  Output 1400 ml  Net -920 ml   Filed Weights   06/11/19 1355 06/12/19 0228  Weight: 90.7 kg 91.8 kg    Examination:  General exam: Alert, awake, oriented x 3 Respiratory system: Clear to auscultation. Respiratory effort normal. Cardiovascular system:RRR. No murmurs, rubs, gallops. Gastrointestinal system: Abdomen is nondistended, soft and nontender. No organomegaly or masses felt. Normal bowel sounds heard. Central nervous system: Alert and oriented. No focal neurological deficits. Extremities: No  C/C/E, +pedal pulses Skin: overall erythema over left lower leg improving. Wound has purulent drainage Psychiatry: Judgement and insight appear normal. Mood & affect appropriate.    Data Reviewed: I have personally reviewed following labs and imaging studies  CBC: Recent Labs  Lab 06/11/19 1420  06/12/19 0456 06/13/19 0516  WBC 9.4 5.2 6.5  NEUTROABS 7.4  --  4.6  HGB 13.3 11.6* 11.6*  HCT 40.7 35.9* 35.4*  MCV 103.0* 104.1* 103.2*  PLT 263 187 207   Basic Metabolic Panel: Recent Labs  Lab 06/11/19 1420 06/12/19 0456 06/13/19 0516  NA 140 140 136  K 4.5 4.3 4.2  CL 101 109 107  CO2 25 23 20*  GLUCOSE 138* 93 96  BUN 50* 44* 25*  CREATININE 1.82* 1.41* 0.87  CALCIUM 10.4* 8.9 9.6  MG  --   --  1.4*   GFR: Estimated Creatinine Clearance: 62.8 mL/min (by C-G formula based on SCr of 0.87 mg/dL). Liver Function Tests: Recent Labs  Lab 06/11/19 1420  AST 22  ALT 21  ALKPHOS 65  BILITOT 0.9  PROT 7.5  ALBUMIN 3.7   No results for input(s): LIPASE, AMYLASE in the last 168 hours. No results for input(s): AMMONIA in the last 168 hours. Coagulation Profile: No results for input(s): INR, PROTIME in the last 168 hours. Cardiac Enzymes: No results for input(s): CKTOTAL, CKMB, CKMBINDEX, TROPONINI in the last 168 hours. BNP (last 3 results) No results for input(s): PROBNP in the last 8760 hours. HbA1C: No results for input(s): HGBA1C in the last 72 hours. CBG: No results for input(s): GLUCAP in the last 168 hours. Lipid Profile: No results for input(s): CHOL, HDL, LDLCALC, TRIG, CHOLHDL, LDLDIRECT in the last 72 hours. Thyroid Function Tests: No results for input(s): TSH, T4TOTAL, FREET4, T3FREE, THYROIDAB in the last 72 hours. Anemia Panel: No results for input(s): VITAMINB12, FOLATE, FERRITIN, TIBC, IRON, RETICCTPCT in the last 72 hours. Sepsis Labs: Recent Labs  Lab 06/11/19 1355 06/11/19 1608  LATICACIDVEN 1.4 1.4    Recent Results (from the past 240 hour(s))  Aerobic Culture (superficial specimen)     Status: None   Collection Time: 06/07/19 10:00 AM   Specimen: Leg  Result Value Ref Range Status   Specimen Description   Final    LEG LOWER LEFT LEG Performed at [facility], [address], [place], [place] [address]    Special  Requests   Final    NONE Performed at [facility], [address] [place], [place] [address]    Gram Stain   Final    RARE WBC PRESENT, PREDOMINANTLY PMN ABUNDANT GRAM POSITIVE COCCI MODERATE YEAST Performed at [facility], [address] [place], [place] [address]    Culture   Final    ABUNDANT ENTEROCOCCUS FAECALIS MODERATE CANDIDA ALBICANS    Report Status 06/10/2019 FINAL  Final   Organism ID, Bacteria ENTEROCOCCUS FAECALIS  Final      Susceptibility   Enterococcus faecalis - MIC*    AMPICILLIN <=2 SENSITIVE Sensitive     VANCOMYCIN 1 SENSITIVE Sensitive     GENTAMICIN SYNERGY SENSITIVE Sensitive     * ABUNDANT ENTEROCOCCUS FAECALIS  Blood Culture (routine x 2)     Status: None (Preliminary result)   Collection Time: 06/11/19  4:07 PM   Specimen: BLOOD  Result Value Ref Range Status   Specimen Description BLOOD RIGHT ANTECUBITAL  Final   Special Requests   Final    BOTTLES DRAWN AEROBIC AND  ANAEROBIC Blood Culture adequate volume   Culture   Final    NO GROWTH 2 DAYS Performed at [facility], [address] [place], [place] [address]    Report Status PENDING  Incomplete  Blood Culture (routine x 2)     Status: None (Preliminary result)   Collection Time: 06/11/19  4:08 PM   Specimen: BLOOD  Result Value Ref Range Status   Specimen Description BLOOD LEFT ANTECUBITAL  Final   Special Requests   Final    BOTTLES DRAWN AEROBIC AND ANAEROBIC Blood Culture adequate volume   Culture   Final    NO GROWTH 2 DAYS Performed at [facility], [address] [place], [place] [address]    Report Status PENDING  Incomplete  SARS CORONAVIRUS 2 (TAT 6-24 HRS) Nasopharyngeal Nasopharyngeal Swab     Status: None   Collection Time: 06/11/19  5:05 PM   Specimen: Nasopharyngeal Swab  Result Value Ref Range Status   SARS Coronavirus 2 NEGATIVE NEGATIVE Final    Comment: (NOTE) SARS-CoV-2 target nucleic acids are NOT DETECTED. The SARS-CoV-2  RNA is generally detectable in upper and lower respiratory specimens during the acute phase of infection. Negative results do not preclude SARS-CoV-2 infection, do not rule out co-infections with other pathogens, and should not be used as the sole basis for treatment or other patient management decisions. Negative results must be combined with clinical observations, patient history, and epidemiological information. The expected result is Negative. Fact Sheet for Patients: HairSlick.no Fact Sheet for Healthcare Providers: quierodirigir.com This test is not yet approved or cleared by the [place] FDA and  has been authorized for detection and/or diagnosis of SARS-CoV-2 by FDA under an Emergency Use Authorization (EUA). This EUA will remain  in effect (meaning this test can be used) for the duration of the COVID-19 declaration under Section 56 4(b)(1) of the Act, 21 U.S.C. section 360bbb-3(b)(1), unless the authorization is terminated or revoked sooner. Performed at [facility], [address] [place], [place] [address]   MRSA PCR Screening     Status: None   Collection Time: 06/12/19  3:14 AM   Specimen: Nasopharyngeal  Result Value Ref Range Status   MRSA by PCR NEGATIVE NEGATIVE Final    Comment:        The GeneXpert MRSA Assay (FDA approved for NASAL specimens only), is one component of a comprehensive MRSA colonization surveillance program. It is not intended to diagnose MRSA infection nor to guide or monitor treatment for MRSA infections. Performed at [facility], [address] [place], [place] [address]          Radiology Studies: No results found.      Scheduled Meds: . apixaban  5 mg Oral BID  . vitamin C  1,000 mg Oral Daily  . calcium-vitamin D  1 tablet Oral BID  . estradiol  1.5 mg Oral Daily  . flecainide  100 mg Oral BID  . FLUoxetine  20 mg Oral Daily  . fluticasone   2 spray Each Nare QPM  . hydrALAZINE  25 mg Oral TID  . metoprolol tartrate  12.5 mg Oral BID  . montelukast  10 mg Oral QHS  . oxybutynin  5 mg Oral QPM  . pantoprazole  40 mg Oral Daily  . pramipexole  0.25 mg Oral QID  . pyridoxine  200 mg Oral Daily  . sucralfate  1 g Oral TID  . traZODone  100 mg Oral QHS  Continuous Infusions: . vancomycin 1,000 mg (06/13/19 1909)     LOS: 1 day    Time spent: 51 minutes    Kathie Dike, MD [facility]   If 7PM-7AM, please contact night-coverage [email or web address] 06/13/2019, 7:24 PM

## 2019-06-13 NOTE — Consult Note (Signed)
Pharmacy Antibiotic Note  [name] is a 79 y.o. female admitted on 06/11/2019 with cellulitis on lower left extremity. No numbness or weakness in the leg. Patient reports it started about 5 weeks ago after she sustained a bruise that turned into an infection. Pharmacy was consulted for vancomycin dosing. She was admitted with AKI that has since resolved. She was administered an initial 2000 mg loading dose of vancomycin. During the period of AKI a random vancomycin level was used to guide dosing following the loading dose.   Vancomycin Level: Random 06/12/19 1758 11 mcg/mL  Following this level a 1500 mg dose of vancomycin was administered on 06/12/19 2049. Although the wound culture is growing pan-sensitive E faecalis the patient has an allergy to penicillins.  Plan: Start vancomycin 1000 mg IV Q 24 hrs beginning at 1800 06/13/19  Goal AUC 400-550  Expected AUC: 461  SCr used: 0.87  T1/2: 14.9h  Css (predicted): 32.1/10.9 mcg/mL  SCr in am  Height: 5\' 6"  (167.6 cm) Weight: 202 lb 6.4 oz (91.8 kg) IBW/kg (Calculated) : 59.3  Temp (24hrs), Avg:97.8 F (36.6 C), Min:97.3 F (36.3 C), Max:98.1 F (36.7 C)  Recent Labs  Lab 06/11/19 1355 06/11/19 1420 06/11/19 1608 06/11/19 2130 06/12/19 0456 06/12/19 1758 06/13/19 0516  WBC  --  9.4  --   --  5.2  --  6.5  CREATININE  --  1.82*  --   --  1.41*  --  0.87  LATICACIDVEN 1.4  --  1.4  --   --   --   --   VANCORANDOM  --   --   --  39  --  11  --     Estimated Creatinine Clearance: 62.8 mL/min (by C-G formula based on SCr of 0.87 mg/dL).    Allergies  Allergen Reactions  . Ace Inhibitors Cough  . Albuterol Other (See Comments)    Unknown reaction  . Iodine Hives  . Minocycline Other (See Comments)    Onset 04/10/2001.  / unknown reaction  . Norvasc [Amlodipine Besylate] Hives  . Penicillins Hives    Did it involve swelling of the face/tongue/throat, SOB, or low BP? Unknown Did it involve sudden or severe  rash/hives, skin peeling, or any reaction on the inside of your mouth or nose? Unknown Did you need to seek medical attention at a hospital or doctor's office? Unknown When did it last happen?>36 years ago If all above answers are "NO", may proceed with cephalosporin use.  04/12/2001 Propoxyphene Hives    Onset 04/10/2001.   . Sulfa Antibiotics Diarrhea and Nausea Only  . Vicodin [Hydrocodone-Acetaminophen] Hives    Hives occur after 5 doses   . Amlodipine Rash  . Betadine [Povidone Iodine] Rash and Other (See Comments)    blisters  . Clarithromycin Rash    Antimicrobials this admission: Vancomycin 1/19 >>   Microbiology results: 1/19 BCx: pending 1/20 MRSA PCR: negative 1/19 SARS CoV-2: negative 1/15 WCx: E faecalis pan-S  Thank you for allowing pharmacy to be a part of this patient's care.  2/15 06/13/2019 7:08 AM

## 2019-06-14 ENCOUNTER — Ambulatory Visit: Payer: Medicare Other | Admitting: Physician Assistant

## 2019-06-14 LAB — CBC WITH DIFFERENTIAL/PLATELET
Abs Immature Granulocytes: 0.05 10*3/uL (ref 0.00–0.07)
Basophils Absolute: 0 10*3/uL (ref 0.0–0.1)
Basophils Relative: 1 %
Eosinophils Absolute: 0.3 10*3/uL (ref 0.0–0.5)
Eosinophils Relative: 5 %
HCT: 34.2 % — ABNORMAL LOW (ref 36.0–46.0)
Hemoglobin: 11.2 g/dL — ABNORMAL LOW (ref 12.0–15.0)
Immature Granulocytes: 1 %
Lymphocytes Relative: 15 %
Lymphs Abs: 0.9 10*3/uL (ref 0.7–4.0)
MCH: 33.4 pg (ref 26.0–34.0)
MCHC: 32.7 g/dL (ref 30.0–36.0)
MCV: 102.1 fL — ABNORMAL HIGH (ref 80.0–100.0)
Monocytes Absolute: 0.5 10*3/uL (ref 0.1–1.0)
Monocytes Relative: 8 %
Neutro Abs: 4.6 10*3/uL (ref 1.7–7.7)
Neutrophils Relative %: 70 %
Platelets: 208 10*3/uL (ref 150–400)
RBC: 3.35 MIL/uL — ABNORMAL LOW (ref 3.87–5.11)
RDW: 12.9 % (ref 11.5–15.5)
WBC: 6.4 10*3/uL (ref 4.0–10.5)
nRBC: 0 % (ref 0.0–0.2)

## 2019-06-14 LAB — BASIC METABOLIC PANEL
Anion gap: 6 (ref 5–15)
BUN: 22 mg/dL (ref 8–23)
CO2: 23 mmol/L (ref 22–32)
Calcium: 9.9 mg/dL (ref 8.9–10.3)
Chloride: 109 mmol/L (ref 98–111)
Creatinine, Ser: 0.91 mg/dL (ref 0.44–1.00)
GFR calc Af Amer: >60 mL/min (ref >60)
GFR calc non Af Amer: >60 mL/min (ref >60)
Glucose, Bld: 104 mg/dL — ABNORMAL HIGH (ref 70–99)
Potassium: 4.3 mmol/L (ref 3.5–5.1)
Sodium: 138 mmol/L (ref 135–145)

## 2019-06-14 LAB — PTH, INTACT AND CALCIUM
Calcium, Total (PTH): 9.4 mg/dL (ref 8.7–10.3)
PTH: 68 pg/mL — ABNORMAL HIGH (ref 15–65)

## 2019-06-14 LAB — MAGNESIUM: Magnesium: 1.8 mg/dL (ref 1.7–2.4)

## 2019-06-14 MED ORDER — VANCOMYCIN HCL IN DEXTROSE 1-5 GM/200ML-% IV SOLN
1000.0000 mg | INTRAVENOUS | Status: AC
  Administered 2019-06-15 – 2019-06-17 (×3): 1000 mg via INTRAVENOUS
  Filled 2019-06-14 (×3): qty 200

## 2019-06-14 NOTE — Care Management Important Message (Addendum)
Important Message  Patient Details  Name: [name] MRN: [id number] Date of Birth: 24-Jan-1941   Medicare Important Message Given:  Yes  Initial Medicare IM given by Patient Access Associate on 06/13/2019 at 10:52am.   [name] 06/14/2019, 8:47 AM

## 2019-06-14 NOTE — Consult Note (Signed)
Pharmacy Antibiotic Note  [name] is a 79 y.o. female admitted on 06/11/2019 with cellulitis on lower left extremity. No numbness or weakness in the leg. Patient reports it started about 5 weeks ago after she sustained a bruise that turned into an infection. Pharmacy was consulted for vancomycin dosing. She was admitted with AKI that has since resolved. She was administered an initial 2000 mg loading dose of vancomycin. During the period of AKI a random vancomycin level was used to guide dosing following the loading dose.   Vancomycin Level: Random 06/12/19 1758 11 mcg/mL  Following this level a 1500 mg dose of vancomycin was administered on 06/12/19 2049. Although the wound culture is growing pan-sensitive E faecalis the patient has an allergy to penicillins. This is day #4 if IV vancomycin, no leukocytosis or fevers and renal function is now back to baseline and stable   Plan: continue vancomycin 1000 mg IV Q 24 hrs  Goal AUC 400-550  Expected AUC: 461  SCr used: 0.91  T1/2: 14.9h  Css (predicted): 32.1/10.9 mcg/mL  SCr in am  Height: 5\' 6"  (167.6 cm) Weight: 202 lb 6.4 oz (91.8 kg) IBW/kg (Calculated) : 59.3  Temp (24hrs), Avg:98 F (36.7 C), Min:97.7 F (36.5 C), Max:98.3 F (36.8 C)  Recent Labs  Lab 06/11/19 1355 06/11/19 1420 06/11/19 1608 06/11/19 2130 06/12/19 0456 06/12/19 1758 06/13/19 0516 06/14/19 0539  WBC  --  9.4  --   --  5.2  --  6.5 6.4  CREATININE  --  1.82*  --   --  1.41*  --  0.87 0.91  LATICACIDVEN 1.4  --  1.4  --   --   --   --   --   VANCORANDOM  --   --   --  39  --  11  --   --     Estimated Creatinine Clearance: 60 mL/min (by C-G formula based on SCr of 0.91 mg/dL).    Allergies  Allergen Reactions  . Ace Inhibitors Cough  . Albuterol Other (See Comments)    Unknown reaction  . Iodine Hives  . Minocycline Other (See Comments)    Onset 04/10/2001.  / unknown reaction  . Norvasc [Amlodipine Besylate] Hives  . Penicillins Hives     Did it involve swelling of the face/tongue/throat, SOB, or low BP? Unknown Did it involve sudden or severe rash/hives, skin peeling, or any reaction on the inside of your mouth or nose? Unknown Did you need to seek medical attention at a hospital or doctor's office? Unknown When did it last happen?>36 years ago If all above answers are "NO", may proceed with cephalosporin use.  04/12/2001 Propoxyphene Hives    Onset 04/10/2001.   . Sulfa Antibiotics Diarrhea and Nausea Only  . Vicodin [Hydrocodone-Acetaminophen] Hives    Hives occur after 5 doses   . Amlodipine Rash  . Betadine [Povidone Iodine] Rash and Other (See Comments)    blisters  . Clarithromycin Rash    Antimicrobials this admission: Vancomycin 1/19 >>   Microbiology results: 1/19 BCx: pending 1/20 MRSA PCR: negative 1/19 SARS CoV-2: negative 1/15 WCx: E faecalis pan-S  Thank you for allowing pharmacy to be a part of this patient's care.  2/15 06/14/2019 9:40 AM

## 2019-06-14 NOTE — Plan of Care (Signed)
Continuing with plan of care. 

## 2019-06-14 NOTE — Progress Notes (Signed)
Per night nurse report, dressing was changed on that shift and due to be changed in two day.

## 2019-06-14 NOTE — Progress Notes (Signed)
PROGRESS NOTE    [name]  FBP:[id number] DOB: 21-Mar-1941 DOA: 06/11/2019  PCP: Danella Penton, MD    LOS - 2   Brief Narrative:  79 y.o. female with medical history significant of A. fib on Eliquis, hypertension, GERD and anxiety who presented to the ED from the wound care clinic on 1/19 with infected leg wound despite outpatient treatment with doxycycline.  Wound present for 5 weeks, PCP sent to wound clinic last week since worsening.  Wound culture was obtained and grew Enterococcus faecalis, started on Doxy.  At follow up visit, wound infection had progressed, patient referred to ED for admission and IV antibiotics.  Started on IV Vancomycin and admitted to hospitalist service with wound care consult. Pt today is doing much better her leg swelling has gone down and we will continue iv abx for total of 7 days which should be  Monday.   Subjective 1/20: Leg is less painful.  Does not feel as warm.  Feels that cellulitis is improving.  Assessment & Plan:   Principal Problem:   Cellulitis of left lower extremity Active Problems:   Atrial fibrillation (HCC)   Edema   Hypertension   Anxiety   GERD (gastroesophageal reflux disease)   AKI (acute kidney injury) (HCC)   Hypercalcemia due to a drug   Cellulitis of left lower extremity -wound culture obtained outpatient at the wound clinic grew Enterococcus faecalis.  Failed outpatient doxycycline.  Vitals within normal limits, no sign of sepsis upon admission. --IV vancomycin --WOC consult --Pain control with Tylenol, tramadol as needed and home Zanaflex -Clinically, slowly improving -Anticipate transitioning to oral Augmentin in the next 24 hours -Will need to follow-up at wound care center.  Hypercalcemia - 10.4 on admission.  Most likely due to thiazide diuretic.   -Improved with hydration --stop metolazone & re-check in 3 months --PTH with AM labs  Acute kidney injury -present on admission.  Creatinine 1.82 on admission  (up from 1.18 in July 2020). Unclear etiology, but differential includes prerenal azotemia from relative dehydration versus reaction to doxycycline. --Gentle IV hydration overnight --BMP in the morning --Further work-up if not improving  Atrial fibrillation -rate controlled --Continue Eliquis, metoprolol, flecainide  Edema  --Hold home Lasix and metolazone due to AKI --stop metolazone on discharge due to hypercalcemia  Hypertension  --per patient, taken off ARB, started on spironolactone recently --hold spironolactone due to AKI --Continue home hydralazine --As needed IV labetalol and or hydralazine   Depression/anxiety --Continue home Xanax and Prozac  GERD (gastroesophageal reflux disease) --Continue home PPI and Carafate  Insomnia --Continue home trazodone     Objective: Vitals:   06/13/19 2012 06/14/19 0704 06/14/19 0802 06/14/19 1248  BP: 140/64 134/63 (!) 129/59 (!) 146/81  Pulse: 80 68 68 69  Resp: 18 18  18   Temp: 98.1 F (36.7 C) 97.7 F (36.5 C)  98 F (36.7 C)  TempSrc: Oral Oral  Oral  SpO2: 94% 93% 93% 94%  Weight:      Height:        Intake/Output Summary (Last 24 hours) at 06/14/2019 1558 Last data filed at 06/14/2019 1300 Gross per 24 hour  Intake 1160 ml  Output --  Net 1160 ml   Filed Weights   06/11/19 1355 06/12/19 0228  Weight: 90.7 kg 91.8 kg    Examination:  General exam: Alert, awake, oriented x 3 Respiratory system: Clear to auscultation. Respiratory effort normal. Cardiovascular system:RRR. No murmurs, rubs, gallops. Gastrointestinal system: Abdomen is nondistended,  soft and nontender. No organomegaly or masses felt. Normal bowel sounds heard. Central nervous system: Alert and oriented. No focal neurological deficits. Extremities: No C/C/E, +pedal pulses Skin: overall erythema over left lower leg improving. Wound has purulent drainage Psychiatry: Judgement and insight appear normal. Mood & affect appropriate.     Data Reviewed: I have personally reviewed following labs and imaging studies  CBC: Recent Labs  Lab 06/11/19 1420 06/12/19 0456 06/13/19 0516 06/14/19 0539  WBC 9.4 5.2 6.5 6.4  NEUTROABS 7.4  --  4.6 4.6  HGB 13.3 11.6* 11.6* 11.2*  HCT 40.7 35.9* 35.4* 34.2*  MCV 103.0* 104.1* 103.2* 102.1*  PLT 263 187 207 031   Basic Metabolic Panel: Recent Labs  Lab 06/11/19 1420 06/11/19 2221 06/12/19 0456 06/13/19 0516 06/14/19 0539  NA 140  --  140 136 138  K 4.5  --  4.3 4.2 4.3  CL 101  --  109 107 109  CO2 25  --  23 20* 23  GLUCOSE 138*  --  93 96 104*  BUN 50*  --  44* 25* 22  CREATININE 1.82*  --  1.41* 0.87 0.91  CALCIUM 10.4* 9.4 8.9 9.6 9.9  MG  --   --   --  1.4* 1.8   GFR: Estimated Creatinine Clearance: 60 mL/min (by C-G formula based on SCr of 0.91 mg/dL). Liver Function Tests: Recent Labs  Lab 06/11/19 1420  AST 22  ALT 21  ALKPHOS 65  BILITOT 0.9  PROT 7.5  ALBUMIN 3.7   No results for input(s): LIPASE, AMYLASE in the last 168 hours. No results for input(s): AMMONIA in the last 168 hours. Coagulation Profile: No results for input(s): INR, PROTIME in the last 168 hours. Cardiac Enzymes: No results for input(s): CKTOTAL, CKMB, CKMBINDEX, TROPONINI in the last 168 hours. BNP (last 3 results) No results for input(s): PROBNP in the last 8760 hours. HbA1C: No results for input(s): HGBA1C in the last 72 hours. CBG: No results for input(s): GLUCAP in the last 168 hours. Lipid Profile: No results for input(s): CHOL, HDL, LDLCALC, TRIG, CHOLHDL, LDLDIRECT in the last 72 hours. Thyroid Function Tests: No results for input(s): TSH, T4TOTAL, FREET4, T3FREE, THYROIDAB in the last 72 hours. Anemia Panel: No results for input(s): VITAMINB12, FOLATE, FERRITIN, TIBC, IRON, RETICCTPCT in the last 72 hours. Sepsis Labs: Recent Labs  Lab 06/11/19 1355 06/11/19 1608  LATICACIDVEN 1.4 1.4    Recent Results (from the past 240 hour(s))  Aerobic Culture  (superficial specimen)     Status: None   Collection Time: 06/07/19 10:00 AM   Specimen: Leg  Result Value Ref Range Status   Specimen Description   Final    LEG LOWER LEFT LEG Performed at [facility], [address], [place], [place] [address]    Special Requests   Final    NONE Performed at [facility], [address] [place], [place] [address]    Gram Stain   Final    RARE WBC PRESENT, PREDOMINANTLY PMN ABUNDANT GRAM POSITIVE COCCI MODERATE YEAST Performed at [facility], [place] [address], [place],  [address]    Culture   Final    ABUNDANT ENTEROCOCCUS FAECALIS MODERATE CANDIDA ALBICANS    Report Status 06/10/2019 FINAL  Final   Organism ID, Bacteria ENTEROCOCCUS FAECALIS  Final      Susceptibility   Enterococcus faecalis - MIC*    AMPICILLIN <=2 SENSITIVE Sensitive     VANCOMYCIN 1 SENSITIVE Sensitive  GENTAMICIN SYNERGY SENSITIVE Sensitive     * ABUNDANT ENTEROCOCCUS FAECALIS  Blood Culture (routine x 2)     Status: None (Preliminary result)   Collection Time: 06/11/19  4:07 PM   Specimen: BLOOD  Result Value Ref Range Status   Specimen Description BLOOD RIGHT ANTECUBITAL  Final   Special Requests   Final    BOTTLES DRAWN AEROBIC AND ANAEROBIC Blood Culture adequate volume   Culture   Final    NO GROWTH 3 DAYS Performed at [facility], [address], [place], [place] [address]    Report Status PENDING  Incomplete  Blood Culture (routine x 2)     Status: None (Preliminary result)   Collection Time: 06/11/19  4:08 PM   Specimen: BLOOD  Result Value Ref Range Status   Specimen Description BLOOD LEFT ANTECUBITAL  Final   Special Requests   Final    BOTTLES DRAWN AEROBIC AND ANAEROBIC Blood Culture adequate volume   Culture   Final    NO GROWTH 3 DAYS Performed at [facility], [address] [place], [place] [address]    Report Status PENDING  Incomplete  SARS CORONAVIRUS 2 (TAT 6-24 HRS)  Nasopharyngeal Nasopharyngeal Swab     Status: None   Collection Time: 06/11/19  5:05 PM   Specimen: Nasopharyngeal Swab  Result Value Ref Range Status   SARS Coronavirus 2 NEGATIVE NEGATIVE Final    Comment: (NOTE) SARS-CoV-2 target nucleic acids are NOT DETECTED. The SARS-CoV-2 RNA is generally detectable in upper and lower respiratory specimens during the acute phase of infection. Negative results do not preclude SARS-CoV-2 infection, do not rule out co-infections with other pathogens, and should not be used as the sole basis for treatment or other patient management decisions. Negative results must be combined with clinical observations, patient history, and epidemiological information. The expected result is Negative. Fact Sheet for Patients: HairSlick.no Fact Sheet for Healthcare Providers: quierodirigir.com This test is not yet approved or cleared by the [place] FDA and  has been authorized for detection and/or diagnosis of SARS-CoV-2 by FDA under an Emergency Use Authorization (EUA). This EUA will remain  in effect (meaning this test can be used) for the duration of the COVID-19 declaration under Section 56 4(b)(1) of the Act, 21 U.S.C. section 360bbb-3(b)(1), unless the authorization is terminated or revoked sooner. Performed at [facility], [address] [place], [place] [address]   MRSA PCR Screening     Status: None   Collection Time: 06/12/19  3:14 AM   Specimen: Nasopharyngeal  Result Value Ref Range Status   MRSA by PCR NEGATIVE NEGATIVE Final    Comment:        The GeneXpert MRSA Assay (FDA approved for NASAL specimens only), is one component of a comprehensive MRSA colonization surveillance program. It is not intended to diagnose MRSA infection nor to guide or monitor treatment for MRSA infections. Performed at [facility], [address] [place], [place] [address]           Radiology Studies: No results found.      Scheduled Meds: . apixaban  5 mg Oral BID  . vitamin C  1,000 mg Oral Daily  . calcium-vitamin D  1 tablet Oral BID  . estradiol  1.5 mg Oral Daily  . flecainide  100 mg Oral BID  . FLUoxetine  20 mg Oral Daily  . fluticasone  2 spray Each Nare QPM  . hydrALAZINE  25 mg Oral  TID  . metoprolol tartrate  12.5 mg Oral BID  . montelukast  10 mg Oral QHS  . oxybutynin  5 mg Oral QPM  . pantoprazole  40 mg Oral Daily  . pramipexole  0.25 mg Oral QID  . pyridoxine  200 mg Oral Daily  . sucralfate  1 g Oral TID  . traZODone  100 mg Oral QHS   Continuous Infusions: . vancomycin Stopped (06/13/19 2014)     LOS: 2 days    Time spent: 35 minutes    Gertha Calkin, MD [facility]   If 7PM-7AM, please contact night-coverage [email or web address] 06/14/2019, 3:58 PM

## 2019-06-15 LAB — BASIC METABOLIC PANEL
Anion gap: 7 (ref 5–15)
BUN: 21 mg/dL (ref 8–23)
CO2: 24 mmol/L (ref 22–32)
Calcium: 9.7 mg/dL (ref 8.9–10.3)
Chloride: 108 mmol/L (ref 98–111)
Creatinine, Ser: 1.1 mg/dL — ABNORMAL HIGH (ref 0.44–1.00)
GFR calc Af Amer: 56 mL/min — ABNORMAL LOW (ref >60)
GFR calc non Af Amer: 49 mL/min — ABNORMAL LOW (ref >60)
Glucose, Bld: 110 mg/dL — ABNORMAL HIGH (ref 70–99)
Potassium: 4.5 mmol/L (ref 3.5–5.1)
Sodium: 139 mmol/L (ref 135–145)

## 2019-06-15 LAB — CBC WITH DIFFERENTIAL/PLATELET
Abs Immature Granulocytes: 0.09 10*3/uL — ABNORMAL HIGH (ref 0.00–0.07)
Basophils Absolute: 0.1 10*3/uL (ref 0.0–0.1)
Basophils Relative: 1 %
Eosinophils Absolute: 0.3 10*3/uL (ref 0.0–0.5)
Eosinophils Relative: 4 %
HCT: 34.5 % — ABNORMAL LOW (ref 36.0–46.0)
Hemoglobin: 11.3 g/dL — ABNORMAL LOW (ref 12.0–15.0)
Immature Granulocytes: 1 %
Lymphocytes Relative: 12 %
Lymphs Abs: 0.9 10*3/uL (ref 0.7–4.0)
MCH: 33.5 pg (ref 26.0–34.0)
MCHC: 32.8 g/dL (ref 30.0–36.0)
MCV: 102.4 fL — ABNORMAL HIGH (ref 80.0–100.0)
Monocytes Absolute: 0.7 10*3/uL (ref 0.1–1.0)
Monocytes Relative: 8 %
Neutro Abs: 6 10*3/uL (ref 1.7–7.7)
Neutrophils Relative %: 74 %
Platelets: 222 10*3/uL (ref 150–400)
RBC: 3.37 MIL/uL — ABNORMAL LOW (ref 3.87–5.11)
RDW: 12.9 % (ref 11.5–15.5)
WBC: 8.1 10*3/uL (ref 4.0–10.5)
nRBC: 0 % (ref 0.0–0.2)

## 2019-06-15 LAB — MAGNESIUM: Magnesium: 1.5 mg/dL — ABNORMAL LOW (ref 1.7–2.4)

## 2019-06-15 MED ORDER — SUMATRIPTAN SUCCINATE 50 MG PO TABS
50.0000 mg | ORAL_TABLET | ORAL | Status: DC | PRN
  Administered 2019-06-16 – 2019-06-18 (×10): 50 mg via ORAL
  Filled 2019-06-15 (×12): qty 1

## 2019-06-15 MED ORDER — OXYCODONE HCL 5 MG PO TABS
5.0000 mg | ORAL_TABLET | ORAL | Status: DC | PRN
  Administered 2019-06-15 – 2019-06-19 (×4): 5 mg via ORAL
  Filled 2019-06-15 (×4): qty 1

## 2019-06-15 MED ORDER — ACETAMINOPHEN 325 MG PO TABS: 650.0000 mg | ORAL_TABLET | Freq: Four times a day (QID) | ORAL | Status: DC | PRN

## 2019-06-15 MED ORDER — SODIUM CHLORIDE 0.9 % IV SOLN
INTRAVENOUS | Status: DC | PRN
  Administered 2019-06-15 (×2): 250 mL via INTRAVENOUS

## 2019-06-15 MED ORDER — COLCHICINE 0.6 MG PO TABS
0.6000 mg | ORAL_TABLET | Freq: Two times a day (BID) | ORAL | Status: DC
  Administered 2019-06-15 – 2019-06-18 (×6): 0.6 mg via ORAL
  Filled 2019-06-15 (×6): qty 1

## 2019-06-15 MED ORDER — MAGNESIUM SULFATE 2 GM/50ML IV SOLN
2.0000 g | Freq: Once | INTRAVENOUS | Status: AC
  Administered 2019-06-15: 2 g via INTRAVENOUS
  Filled 2019-06-15: qty 50

## 2019-06-15 MED ORDER — TRAMADOL HCL 50 MG PO TABS
50.0000 mg | ORAL_TABLET | Freq: Four times a day (QID) | ORAL | Status: DC | PRN
  Administered 2019-06-16: 15:00:00 50 mg via ORAL
  Filled 2019-06-15: qty 1

## 2019-06-15 MED ORDER — COLCHICINE 0.6 MG PO TABS
0.6000 mg | ORAL_TABLET | Freq: Two times a day (BID) | ORAL | Status: DC | PRN
  Administered 2019-06-15: 10:00:00 0.6 mg via ORAL
  Filled 2019-06-15: qty 1

## 2019-06-15 MED ORDER — FUROSEMIDE 40 MG PO TABS
40.0000 mg | ORAL_TABLET | Freq: Every day | ORAL | Status: DC
  Administered 2019-06-15 – 2019-06-17 (×3): 40 mg via ORAL
  Filled 2019-06-15 (×3): qty 1

## 2019-06-15 NOTE — Plan of Care (Signed)
Continuing with plan of care. 

## 2019-06-15 NOTE — Progress Notes (Signed)
PROGRESS NOTE    [name]  VEH:[id number] DOB: 17-Oct-1940 DOA: 06/11/2019  PCP: Danella Penton, MD    LOS - 3   Brief Narrative:  79 y.o. female with medical history significant of A. fib on Eliquis, hypertension, GERD and anxiety who presented to the ED from the wound care clinic on 1/19 with infected leg wound despite outpatient treatment with doxycycline.  Wound present for 5 weeks, PCP sent to wound clinic last week since worsening.  Wound culture was obtained and grew Enterococcus faecalis, started on Doxy.  At follow up visit, wound infection had progressed, patient referred to ED for admission and IV antibiotics.  Started on IV Vancomycin and admitted to hospitalist service with wound care consult. Pt today is doing much better her leg swelling has gone down and we will continue iv abx for total of 7 days which should be  Monday.   1/23: Patient seen and examined.  Complains of some pain in the affected lower extremity that she attributes to gout.  Decreased range of motion of left ankle.  Patient on day 5 of vancomycin therapy.  Assessment & Plan:   Principal Problem:   Cellulitis of left lower extremity Active Problems:   Atrial fibrillation (HCC)   Edema   Hypertension   Anxiety   GERD (gastroesophageal reflux disease)   AKI (acute kidney injury) (HCC)   Hypercalcemia due to a drug   Cellulitis of left lower extremity  wound culture obtained outpatient at the wound clinic grew Enterococcus faecalis. Failed outpatient doxycycline.   Vitals within normal limits, no sign of sepsis upon admission. Patient has penicillin allergy Plan: --IV vancomycin, day 5.  Per documentation of 7-day course.  Last Monday --WOC consult, dressing recommendations provided.  WOC not following actively.  Reconsult as needed --Multimodal pain control -Clinically, slowly improving -If patient insistent on leaving consider transition to regular oral antibiotic however choice may be an issue  considering multiple antibiotic intolerances -Otherwise most prudent course of action would be to complete 7 days of vancomycin in house prior to discharge  Possible gout flare Patient with a history of gout and now feels like pain in ankle is similar to previous gout flares Will treat with colchicine 0.6 mg twice daily  Hypercalcemia  10.4 on admission Most likely due to thiazide diuretic.   -Improved with hydration -stop metolazone & re-check in 3 months  Acute kidney injury, improved Creatinine 1.82 on admission (up from 1.18 in July 2020). Unclear etiology, but differential includes prerenal azotemia from relative dehydration versus reaction to doxycycline. -Essentially resolved, no further work-up at this time  Atrial fibrillation -rate controlled --Continue Eliquis, metoprolol, flecainide  Edema  --Can resume home Lasix, was held due to AKI --stop metolazone on discharge due to hypercalcemia  Hypertension  --per patient, taken off ARB, started on spironolactone recently --hold spironolactone due to AKI --Continue to hold aldactone due to relative hypotension --Continue home hydralazine --As needed IV labetalol and or hydralazine   Depression/anxiety --Continue home Xanax and Prozac  GERD (gastroesophageal reflux disease) --Continue home PPI and Carafate  Insomnia --Continue home trazodone     Objective: Vitals:   06/14/19 1928 06/15/19 0348 06/15/19 0943 06/15/19 1214  BP: 126/60 116/61 (!) 116/51 (!) 127/52  Pulse: 85 76 66 63  Resp: 18 16  16   Temp:    98.8 F (37.1 C)  TempSrc:    Oral  SpO2: 96% 94%  92%  Weight:      Height:  No intake or output data in the 24 hours ending 06/15/19 1508 Filed Weights   06/11/19 1355 06/12/19 0228  Weight: 90.7 kg 91.8 kg    Examination:  General exam: Alert, awake, oriented x 3 Respiratory system: Clear to auscultation. Respiratory effort normal. Cardiovascular system:RRR. No murmurs, rubs,  gallops. Gastrointestinal system: Abdomen is nondistended, soft and nontender. No organomegaly or masses felt. Normal bowel sounds heard. Central nervous system: Alert and oriented. No focal neurological deficits. Extremities: No C/C/E, +pedal pulses Skin: overall erythema over left lower leg improving. Wound has purulent drainage Psychiatry: Judgement and insight appear normal. Mood & affect appropriate.    Data Reviewed: I have personally reviewed following labs and imaging studies  CBC: Recent Labs  Lab 06/11/19 1420 06/12/19 0456 06/13/19 0516 06/14/19 0539 06/15/19 0438  WBC 9.4 5.2 6.5 6.4 8.1  NEUTROABS 7.4  --  4.6 4.6 6.0  HGB 13.3 11.6* 11.6* 11.2* 11.3*  HCT 40.7 35.9* 35.4* 34.2* 34.5*  MCV 103.0* 104.1* 103.2* 102.1* 102.4*  PLT 263 187 207 208 831   Basic Metabolic Panel: Recent Labs  Lab 06/11/19 1420 06/11/19 2221 06/12/19 0456 06/13/19 0516 06/14/19 0539 06/15/19 0438  NA 140  --  140 136 138 139  K 4.5  --  4.3 4.2 4.3 4.5  CL 101  --  109 107 109 108  CO2 25  --  23 20* 23 24  GLUCOSE 138*  --  93 96 104* 110*  BUN 50*  --  44* 25* 22 21  CREATININE 1.82*  --  1.41* 0.87 0.91 1.10*  CALCIUM 10.4* 9.4 8.9 9.6 9.9 9.7  MG  --   --   --  1.4* 1.8 1.5*   GFR: Estimated Creatinine Clearance: 49.7 mL/min (A) (by C-G formula based on SCr of 1.1 mg/dL (H)). Liver Function Tests: Recent Labs  Lab 06/11/19 1420  AST 22  ALT 21  ALKPHOS 65  BILITOT 0.9  PROT 7.5  ALBUMIN 3.7   No results for input(s): LIPASE, AMYLASE in the last 168 hours. No results for input(s): AMMONIA in the last 168 hours. Coagulation Profile: No results for input(s): INR, PROTIME in the last 168 hours. Cardiac Enzymes: No results for input(s): CKTOTAL, CKMB, CKMBINDEX, TROPONINI in the last 168 hours. BNP (last 3 results) No results for input(s): PROBNP in the last 8760 hours. HbA1C: No results for input(s): HGBA1C in the last 72 hours. CBG: No results for input(s):  GLUCAP in the last 168 hours. Lipid Profile: No results for input(s): CHOL, HDL, LDLCALC, TRIG, CHOLHDL, LDLDIRECT in the last 72 hours. Thyroid Function Tests: No results for input(s): TSH, T4TOTAL, FREET4, T3FREE, THYROIDAB in the last 72 hours. Anemia Panel: No results for input(s): VITAMINB12, FOLATE, FERRITIN, TIBC, IRON, RETICCTPCT in the last 72 hours. Sepsis Labs: Recent Labs  Lab 06/11/19 1355 06/11/19 1608  LATICACIDVEN 1.4 1.4    Recent Results (from the past 240 hour(s))  Aerobic Culture (superficial specimen)     Status: None   Collection Time: 06/07/19 10:00 AM   Specimen: Leg  Result Value Ref Range Status   Specimen Description   Final    LEG LOWER LEFT LEG Performed at [facility], [address], [place], [place] [address]    Special Requests   Final    NONE Performed at [facility], [address] [place], [place] [address]    Gram Stain   Final    RARE WBC PRESENT, PREDOMINANTLY PMN ABUNDANT GRAM POSITIVE  COCCI MODERATE YEAST Performed at [facility], [address] [place], [place] [address]    Culture   Final    ABUNDANT ENTEROCOCCUS FAECALIS MODERATE CANDIDA ALBICANS    Report Status 06/10/2019 FINAL  Final   Organism ID, Bacteria ENTEROCOCCUS FAECALIS  Final      Susceptibility   Enterococcus faecalis - MIC*    AMPICILLIN <=2 SENSITIVE Sensitive     VANCOMYCIN 1 SENSITIVE Sensitive     GENTAMICIN SYNERGY SENSITIVE Sensitive     * ABUNDANT ENTEROCOCCUS FAECALIS  Blood Culture (routine x 2)     Status: None (Preliminary result)   Collection Time: 06/11/19  4:07 PM   Specimen: BLOOD  Result Value Ref Range Status   Specimen Description BLOOD RIGHT ANTECUBITAL  Final   Special Requests   Final    BOTTLES DRAWN AEROBIC AND ANAEROBIC Blood Culture adequate volume   Culture   Final    NO GROWTH 4 DAYS Performed at [facility], [address] [place], [place] [address]    Report Status PENDING   Incomplete  Blood Culture (routine x 2)     Status: None (Preliminary result)   Collection Time: 06/11/19  4:08 PM   Specimen: BLOOD  Result Value Ref Range Status   Specimen Description BLOOD LEFT ANTECUBITAL  Final   Special Requests   Final    BOTTLES DRAWN AEROBIC AND ANAEROBIC Blood Culture adequate volume   Culture   Final    NO GROWTH 4 DAYS Performed at [facility], [address], [place], [place] [address]    Report Status PENDING  Incomplete  SARS CORONAVIRUS 2 (TAT 6-24 HRS) Nasopharyngeal Nasopharyngeal Swab     Status: None   Collection Time: 06/11/19  5:05 PM   Specimen: Nasopharyngeal Swab  Result Value Ref Range Status   SARS Coronavirus 2 NEGATIVE NEGATIVE Final    Comment: (NOTE) SARS-CoV-2 target nucleic acids are NOT DETECTED. The SARS-CoV-2 RNA is generally detectable in upper and lower respiratory specimens during the acute phase of infection. Negative results do not preclude SARS-CoV-2 infection, do not rule out co-infections with other pathogens, and should not be used as the sole basis for treatment or other patient management decisions. Negative results must be combined with clinical observations, patient history, and epidemiological information. The expected result is Negative. Fact Sheet for Patients: HairSlick.no Fact Sheet for Healthcare Providers: quierodirigir.com This test is not yet approved or cleared by the [place] FDA and  has been authorized for detection and/or diagnosis of SARS-CoV-2 by FDA under an Emergency Use Authorization (EUA). This EUA will remain  in effect (meaning this test can be used) for the duration of the COVID-19 declaration under Section 56 4(b)(1) of the Act, 21 U.S.C. section 360bbb-3(b)(1), unless the authorization is terminated or revoked sooner. Performed at [facility], [address] [place], [place] [address]   MRSA PCR Screening      Status: None   Collection Time: 06/12/19  3:14 AM   Specimen: Nasopharyngeal  Result Value Ref Range Status   MRSA by PCR NEGATIVE NEGATIVE Final    Comment:        The GeneXpert MRSA Assay (FDA approved for NASAL specimens only), is one component of a comprehensive MRSA colonization surveillance program. It is not intended to diagnose MRSA infection nor to guide or monitor treatment for MRSA infections. Performed at [facility], [address] [place], [place] [address]  Radiology Studies: No results found.      Scheduled Meds: . apixaban  5 mg Oral BID  . vitamin C  1,000 mg Oral Daily  . calcium-vitamin D  1 tablet Oral BID  . colchicine  0.6 mg Oral BID  . estradiol  1.5 mg Oral Daily  . flecainide  100 mg Oral BID  . FLUoxetine  20 mg Oral Daily  . fluticasone  2 spray Each Nare QPM  . hydrALAZINE  25 mg Oral TID  . metoprolol tartrate  12.5 mg Oral BID  . montelukast  10 mg Oral QHS  . oxybutynin  5 mg Oral QPM  . pantoprazole  40 mg Oral Daily  . pramipexole  0.25 mg Oral QID  . pyridoxine  200 mg Oral Daily  . sucralfate  1 g Oral TID  . traZODone  100 mg Oral QHS   Continuous Infusions: . sodium chloride 250 mL (06/15/19 1135)  . vancomycin 200 mL/hr at 06/14/19 1850  . vancomycin       LOS: 3 days    Time spent: 35 minutes    Tresa Moore, MD [facility]   If 7PM-7AM, please contact night-coverage [email or web address] 06/15/2019, 3:08 PM

## 2019-06-16 LAB — CULTURE, BLOOD (ROUTINE X 2)
Culture: NO GROWTH
Culture: NO GROWTH
Special Requests: ADEQUATE
Special Requests: ADEQUATE

## 2019-06-16 NOTE — Plan of Care (Signed)
Continuing with plan of care. 

## 2019-06-16 NOTE — Progress Notes (Signed)
PT Cancellation Note  Patient Details Name: NICOLENA SCHURMAN MRN: [id number] DOB: 09-Dec-1940   Cancelled Treatment:    Reason Eval/Treat Not Completed: Pain limiting ability to participate   Ezekiel Ina, PTDPT 06/16/2019, 2:12 PM

## 2019-06-16 NOTE — Progress Notes (Signed)
[name], Ann DOA: 06/11/2019  PCP: Rusty Aus, Ann    LOS - 4   Brief Narrative:  79 y.o. female [name] medical history significant of A. fib on [name], [name], [name] doxycycline.  Wound present for 5 weeks, PCP sent to wound clinic last week since worsening.  Wound culture was obtained and grew Enterococcus faecalis, started on Doxy.  At follow up visit, wound infection had progressed, patient referred to ED for admission and IV antibiotics.  Started on IV Vancomycin for total of 7 days and admitted to hospitalist service [name] wound care consult.  Assessment & Plan:   Principal Problem:   Cellulitis of left lower extremity Active Problems:   Atrial fibrillation (HCC)   Edema   [name]   Anxiety   Ann (gastroesophageal reflux disease)   AKI (acute kidney injury) ([place])   Hypercalcemia due to a drug   Cellulitis of left lower extremity  wound culture obtained outpatient at the wound clinic grew Enterococcus faecalis. Failed outpatient doxycycline.   Vitals within normal limits, no sign of sepsis upon admission. Patient has penicillin allergy --Continue IV vancomycin, day 6/7.  Per documentation of 7-day course.  --WOC consult, dressing recommendations provided.  WOC not following actively.  Reconsult as needed -Clinically, slowly improving   Possible gout flare Started on colchicine 0.6 mg twice daily Improving  Hypercalcemia  10.4 on admission Most likely due to thiazide diuretic.   -Improved [name] hydration -stop metolazone & re-check in 3 months  Acute kidney injury, improved Creatinine 1.82 on admission (up from 1.18 in July 2020). Unclear etiology, but differential includes prerenal azotemia from relative dehydration versus reaction to doxycycline. -Essentially resolved, no  further work-up at this time  Atrial fibrillation -rate controlled --Continue [name], metoprolol, flecainide  Edema  --Home Lasix dose was resumed initially held due to AKI --stop metolazone on discharge due to hypercalcemia  [name]  --per patient, taken off ARB, started on spironolactone recently --held spironolactone due to AKI --Continue to hold aldactone due to relative hypotension --Continue home hydralazine, increase if need to as tolerated --As needed IV labetalol and or hydralazine   Depression/anxiety --Continue home Xanax and Prozac  Ann (gastroesophageal reflux disease) --Continue home PPI and Carafate  Insomnia --Continue home trazodone     Objective: Vitals:   06/16/19 1100 06/16/19 1625 06/16/19 1632 06/16/19 1632  BP: (!) 170/69 (!) 160/74  (!) 160/74  Pulse: 74 74 72 74  Resp:  (!) 24    Temp:  98.9 F (37.2 C)  98.9 F (37.2 C)  TempSrc:  Oral  Oral  SpO2:  (!) 86% 95% 94%  Weight:      Height:        Intake/Output Summary (Last 24 hours) at 06/16/2019 1750 Last data filed at 06/16/2019 1329 Gross per 24 hour  Intake 445.13 ml  Output 400 ml  Net 45.13 ml   Filed Weights   06/11/19 1355 06/12/19 0228  Weight: 90.7 kg 91.8 kg    Examination:  General exam: Pleasant, no acute distress, laying in bed Respiratory system: Clear to auscultation. Respiratory effort normal. Cardiovascular system:RRR. No murmurs, rubs, gallops. Gastrointestinal system: Abdomen is nondistended, soft and nontender. Normal bowel sounds heard. Central nervous system: Alert and oriented x3. No focal neurological deficits. Extremities: No edema or cyanosis, left leg  wrapped Skin: Warm and dry Psychiatry: Judgement and insight appear normal. Mood & affect appropriate.    Data Reviewed: I have personally reviewed following labs and imaging studies  CBC: Recent Labs  Lab 06/11/19 1420 06/12/19 0456 06/13/19 0516 06/14/19 0539 06/15/19 0438  WBC  9.4 5.2 6.5 6.4 8.1  NEUTROABS 7.4  --  4.6 4.6 6.0  HGB 13.3 11.6* 11.6* 11.2* 11.3*  HCT 40.7 35.9* 35.4* 34.2* 34.5*  MCV 103.0* 104.1* 103.2* 102.1* 102.4*  PLT 263 187 207 208 222   Basic Metabolic Panel: Recent Labs  Lab 06/11/19 1420 06/11/19 2221 06/12/19 0456 06/13/19 0516 06/14/19 0539 06/15/19 0438  NA 140  --  140 136 138 139  K 4.5  --  4.3 4.2 4.3 4.5  CL 101  --  109 107 109 108  CO2 25  --  23 20* 23 24  GLUCOSE 138*  --  93 96 104* 110*  BUN 50*  --  44* 25* 22 21  CREATININE 1.82*  --  1.41* 0.87 0.91 1.10*  CALCIUM 10.4* 9.4 8.9 9.6 9.9 9.7  MG  --   --   --  1.4* 1.8 1.5*   GFR: Estimated Creatinine Clearance: 49.7 mL/min (A) (by C-G formula based on SCr of 1.1 mg/dL (H)). Liver Function Tests: Recent Labs  Lab 06/11/19 1420  AST 22  ALT 21  ALKPHOS 65  BILITOT 0.9  PROT 7.5  ALBUMIN 3.7   No results for input(s): LIPASE, AMYLASE in the last 168 hours. No results for input(s): AMMONIA in the last 168 hours. Coagulation Profile: No results for input(s): INR, PROTIME in the last 168 hours. Cardiac Enzymes: No results for input(s): CKTOTAL, CKMB, CKMBINDEX, TROPONINI in the last 168 hours. BNP (last 3 results) No results for input(s): PROBNP in the last 8760 hours. HbA1C: No results for input(s): HGBA1C in the last 72 hours. CBG: No results for input(s): GLUCAP in the last 168 hours. Lipid Profile: No results for input(s): CHOL, HDL, LDLCALC, TRIG, CHOLHDL, LDLDIRECT in the last 72 hours. Thyroid Function Tests: No results for input(s): TSH, T4TOTAL, FREET4, T3FREE, THYROIDAB in the last 72 hours. Anemia Panel: No results for input(s): VITAMINB12, FOLATE, FERRITIN, TIBC, IRON, RETICCTPCT in the last 72 hours. Sepsis Labs: Recent Labs  Lab 06/11/19 1355 06/11/19 1608  LATICACIDVEN 1.4 1.4    Recent Results (from the past 240 hour(s))  Aerobic Culture (superficial specimen)     Status: None   Collection Time: 01/[name]/21 10:00 AM    Specimen: Leg  Result Value Ref Range Status   Specimen Description   Final    LEG LOWER LEFT LEG Performed at [facility], [address], [place], [place] [address]    Special Requests   Final    NONE Performed at [facility], [address] [place], [place] [address]    Gram Stain   Final    RARE WBC PRESENT, PREDOMINANTLY PMN ABUNDANT GRAM POSITIVE COCCI MODERATE YEAST Performed at [facility], [address] [place], [place] [address]    Culture   Final    ABUNDANT ENTEROCOCCUS FAECALIS MODERATE CANDIDA ALBICANS    Report Status 06/10/2019 FINAL  Final   Organism ID, Bacteria ENTEROCOCCUS FAECALIS  Final      Susceptibility   Enterococcus faecalis - MIC*    AMPICILLIN <=2 SENSITIVE Sensitive     VANCOMYCIN 1 SENSITIVE Sensitive     GENTAMICIN SYNERGY SENSITIVE Sensitive     * ABUNDANT  ENTEROCOCCUS FAECALIS  Blood Culture (routine x 2)     Status: None   Collection Time: 06/11/19  4:07 PM   Specimen: BLOOD  Result Value Ref Range Status   Specimen Description BLOOD RIGHT ANTECUBITAL  Final   Special Requests   Final    BOTTLES DRAWN AEROBIC AND ANAEROBIC Blood Culture adequate volume   Culture   Final    NO GROWTH 5 DAYS Performed at [facility], [address], [place], [place] [address]    Report Status 06/16/2019 FINAL  Final  Blood Culture (routine x 2)     Status: None   Collection Time: 06/11/19  4:08 PM   Specimen: BLOOD  Result Value Ref Range Status   Specimen Description BLOOD LEFT ANTECUBITAL  Final   Special Requests   Final    BOTTLES DRAWN AEROBIC AND ANAEROBIC Blood Culture adequate volume   Culture   Final    NO GROWTH 5 DAYS Performed at [facility], [address], [place], [place] [address]    Report Status 06/16/2019 FINAL  Final  SARS CORONAVIRUS 2 (TAT 6-24 HRS) Nasopharyngeal Nasopharyngeal Swab     Status: None   Collection Time: 06/11/19  5:05 PM   Specimen: Nasopharyngeal Swab    Result Value Ref Range Status   SARS Coronavirus 2 NEGATIVE NEGATIVE Final    Comment: (NOTE) SARS-CoV-2 target nucleic acids are NOT DETECTED. The SARS-CoV-2 RNA is generally detectable in upper and lower respiratory specimens during the acute phase of infection. Negative results do not preclude SARS-CoV-2 infection, do not rule out co-infections [name] other pathogens, and should not be used as the sole basis for treatment or other patient management decisions. Negative results must be combined [name] clinical observations, patient history, and epidemiological information. The expected result is Negative. Fact Sheet for Patients: HairSlick.no Fact Sheet for Healthcare Providers: quierodirigir.com This test is not yet approved or cleared by the [place] FDA and  has been authorized for detection and/or diagnosis of SARS-CoV-2 by FDA under an Emergency Use Authorization (EUA). This EUA will remain  in effect (meaning this test can be used) for the duration of the COVID-19 declaration under Section 56 4(b)(1) of the Act, 21 U.S.C. section 360bbb-3(b)(1), unless the authorization is terminated or revoked sooner. Performed at [facility], [address], [place], [place] [address]   MRSA PCR Screening     Status: None   Collection Time: 06/12/19  3:14 AM   Specimen: Nasopharyngeal  Result Value Ref Range Status   MRSA by PCR NEGATIVE NEGATIVE Final    Comment:        The GeneXpert MRSA Assay (FDA approved for NASAL specimens only), is one component of a comprehensive MRSA colonization surveillance program. It is not intended to diagnose MRSA infection nor to guide or monitor treatment for MRSA infections. Performed at [facility], [address] [place], [place] [address]          Radiology Studies: No results found.      Scheduled Meds: . apixaban  5 mg Oral BID  . vitamin C  1,000 mg  Oral Daily  . calcium-vitamin D  1 tablet Oral BID  . colchicine  0.6 mg Oral BID  . estradiol  1.5 mg Oral Daily  . flecainide  100 mg Oral BID  . FLUoxetine  20 mg Oral Daily  . fluticasone  2 spray Each Nare QPM  . furosemide  40 mg Oral Daily  .  hydrALAZINE  25 mg Oral TID  . metoprolol tartrate  12.5 mg Oral BID  . montelukast  10 mg Oral QHS  . oxybutynin  5 mg Oral QPM  . pantoprazole  40 mg Oral Daily  . pramipexole  0.25 mg Oral QID  . pyridoxine  200 mg Oral Daily  . sucralfate  1 g Oral TID  . traZODone  100 mg Oral QHS   Continuous Infusions: . sodium chloride Stopped (06/15/19 1838)  . vancomycin 1,000 mg (06/16/19 1605)     LOS: 4 days    Time spent: 45 minutes [name] more than 50% COC    Lynn Ito, Ann [facility]   If 7PM-7AM, please contact night-coverage [email or web address] 06/16/2019, 5:50 PM  Patient ID: MEKAELA AZIZI, female   DOB: [birth year], 79 y.o.   MRN: [id number]

## 2019-06-17 LAB — BASIC METABOLIC PANEL
Anion gap: 12 (ref 5–15)
BUN: 33 mg/dL — ABNORMAL HIGH (ref 8–23)
CO2: 25 mmol/L (ref 22–32)
Calcium: 10.3 mg/dL (ref 8.9–10.3)
Chloride: 101 mmol/L (ref 98–111)
Creatinine, Ser: 1.79 mg/dL — ABNORMAL HIGH (ref 0.44–1.00)
GFR calc Af Amer: 31 mL/min — ABNORMAL LOW (ref >60)
GFR calc non Af Amer: 27 mL/min — ABNORMAL LOW (ref >60)
Glucose, Bld: 93 mg/dL (ref 70–99)
Potassium: 4 mmol/L (ref 3.5–5.1)
Sodium: 138 mmol/L (ref 135–145)

## 2019-06-17 LAB — VANCOMYCIN, RANDOM: Vancomycin Rm: 13

## 2019-06-17 MED ORDER — POLYETHYLENE GLYCOL 3350 17 G PO PACK
17.0000 g | PACK | Freq: Every day | ORAL | Status: DC
  Administered 2019-06-18: 17 g via ORAL
  Filled 2019-06-17 (×2): qty 1

## 2019-06-17 NOTE — Care Management Important Message (Signed)
Important Message  Patient Details  Name: [name] MRN: [id number] Date of Birth: 1940-07-01   Medicare Important Message Given:  Yes     [name] 06/17/2019, 11:36 AM

## 2019-06-17 NOTE — Consult Note (Signed)
Pharmacy Antibiotic Note  [name] is a 79 y.o. female admitted on 06/11/2019 with cellulitis on lower left extremity. No numbness or weakness in the leg. Patient reports it started about 5 weeks ago after she sustained a bruise that turned into an infection. Pharmacy was consulted for vancomycin dosing. She was admitted with AKI that had resolved but her Scr jumped up this morning and a random vancomycin level was ordered to ensure that the level was not too high prior to the final dose  Vancomycin Level:  Random 06/17/19 1419 13 mcg/mL   Although the wound culture is growing pan-sensitive E faecalis the patient has an allergy to penicillins. This is day #7 if IV vancomycin, no leukocytosis or fevers and renal function is above baseline but better since admission   Plan: continue vancomycin 1000 mg IV x 1 for her final dose  Goal AUC 400-550  Expected AUC: 461  SCr used: 1.79  T1/2: 23.9h  Css (predicted): 34.5/12.9 mcg/m  Height: 5\' 6"  (167.6 cm) Weight: 202 lb 6.4 oz (91.8 kg) IBW/kg (Calculated) : 59.3  Temp (24hrs), Avg:98.4 F (36.9 C), Min:97.6 F (36.4 C), Max:98.9 F (37.2 C)  Recent Labs  Lab  0000 06/11/19 1355 06/11/19 1420 06/11/19 1608 06/11/19 2130 06/12/19 0456 06/12/19 1758 06/13/19 0516 06/14/19 0539 06/15/19 0438 06/17/19 0653 06/17/19 1419  WBC  --   --  9.4  --   --  5.2  --  6.5 6.4 8.1  --   --   CREATININE   < >  --  1.82*  --   --  1.41*  --  0.87 0.91 1.10* 1.79*  --   LATICACIDVEN  --  1.4  --  1.4  --   --   --   --   --   --   --   --   VANCORANDOM  --   --   --   --    < >  --  11  --   --   --   --  13   < > = values in this interval not displayed.    Estimated Creatinine Clearance: 30.5 mL/min (A) (by C-G formula based on SCr of 1.79 mg/dL (H)).    Allergies  Allergen Reactions  . Ace Inhibitors Cough  . Albuterol Other (See Comments)    Unknown reaction  . Iodine Hives  . Minocycline Other (See Comments)    Onset  04/10/2001.  / unknown reaction  . Norvasc [Amlodipine Besylate] Hives  . Penicillins Hives    Did it involve swelling of the face/tongue/throat, SOB, or low BP? Unknown Did it involve sudden or severe rash/hives, skin peeling, or any reaction on the inside of your mouth or nose? Unknown Did you need to seek medical attention at a hospital or doctor's office? Unknown When did it last happen?>36 years ago If all above answers are "NO", may proceed with cephalosporin use.  Marland Kitchen Propoxyphene Hives    Onset 04/10/2001.   . Sulfa Antibiotics Diarrhea and Nausea Only  . Vicodin [Hydrocodone-Acetaminophen] Hives    Hives occur after 5 doses   . Amlodipine Rash  . Betadine [Povidone Iodine] Rash and Other (See Comments)    blisters  . Clarithromycin Rash    Antimicrobials this admission: Vancomycin 1/19 >>   Microbiology results: 1/19 BCx: pending 1/20 MRSA PCR: negative 1/19 SARS CoV-2: negative 1/15 WCx: E faecalis pan-S  Thank you for allowing pharmacy to be a part of  this patient's care.  Lowella Bandy 06/17/2019 3:09 PM
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
## 2019-06-17 NOTE — Progress Notes (Signed)
PROGRESS NOTE    [name]  QPY:[id number] DOB: September 16, 1940 DOA: 06/11/2019  PCP: Rusty Aus, MD    LOS - 5   Brief Narrative:  79 y.o. female with medical history significant of A. fib on Eliquis, hypertension, GERD and anxiety who presented to the ED from the wound care clinic on 1/19 with infected leg wound despite outpatient treatment with doxycycline.  Wound present for 5 weeks, PCP sent to wound clinic last week since worsening.  Wound culture was obtained and grew Enterococcus faecalis, started on Doxy.  At follow up visit, wound infection had progressed, patient referred to ED for admission and IV antibiotics.  Started on IV Vancomycin for total of 7 days and admitted to hospitalist service with wound care consult.  Assessment & Plan:   Principal Problem:   Cellulitis of left lower extremity Active Problems:   Atrial fibrillation (HCC)   Edema   Hypertension   Anxiety   GERD (gastroesophageal reflux disease)   AKI (acute kidney injury) ([place])   Hypercalcemia due to a drug   Cellulitis of left lower extremity  wound culture obtained outpatient at the wound clinic grew Enterococcus faecalis. Failed outpatient doxycycline.   Vitals within normal limits, no sign of sepsis upon admission. Patient has penicillin allergy --Today last dose of vancomycin day 7/7.  Per documentation of 7-day course.  --WOC consult, dressing recommendations provided.  WOC not following actively.  Reconsult as needed -Clinically, slowly improving  AKI-new. Creatinine increased to 1.79 Vanco trough level is stable.  Spoke to pharmacy vancomycin dosing is stable High patient bladder, voided 500 but still retaining 496.  Likely this is because of AKI.  We will place Foley for now and try voiding trial next 1 to 2 days She was constipated and had bowel movement this certainly can be causing her urinary retention  Possible gout flare Started on colchicine 0.6 mg twice  daily Improving  Hypercalcemia  10.4 on admission Most likely due to thiazide diuretic.   -Improved with hydration -stop metolazone & re-check in 3 months  Acute kidney injury, improved Creatinine 1.82 on admission (up from 1.18 in July 2020). Unclear etiology, but differential includes prerenal azotemia from relative dehydration versus reaction to doxycycline. -Essentially resolved, no further work-up at this time  Atrial fibrillation -rate controlled --Continue Eliquis, metoprolol, flecainide  Edema  --Home Lasix dose was resumed initially held due to AKI --stop metolazone on discharge due to hypercalcemia  Hypertension  --per patient, taken off ARB, started on spironolactone recently --held spironolactone due to AKI --Continue to hold aldactone due to relative hypotension --Continue home hydralazine, increase if need to as tolerated --As needed IV labetalol and or hydralazine   Depression/anxiety --Continue home Xanax and Prozac  GERD (gastroesophageal reflux disease) --Continue home PPI and Carafate  Insomnia --Continue home trazodone   DVT prophylaxis: On Eliquis Code Status: Full  Family Communication: None at bedside.  Disposition Plan:  Today last day of vancomycin.  Creatinine increased and also patient with urinary retention.  Foley placed.  Will monitor labs and if able to void within the next day or 2 will discharge home.  PT recommended home health   Subjective: Complaining of suprapubic fullness earlier when I saw her in a.m. otherwise no other complaints  Objective: Vitals:   06/16/19 1632 06/16/19 1632 06/16/19 1944 06/17/19 0359  BP:  (!) 160/74 (!) 146/55 (!) 107/55  Pulse: 72 74 65 64  Resp:   20 18  Temp:  98.9  F (37.2 C) 98.2 F (36.8 C) 97.6 F (36.4 C)  TempSrc:  Oral Oral Oral  SpO2: 95% 94% 93% 94%  Weight:      Height:        Intake/Output Summary (Last 24 hours) at 06/17/2019 0820 Last data filed at 06/17/2019  0300 Gross per 24 hour  Intake 407.9 ml  Output 400 ml  Net 7.9 ml   Filed Weights   06/11/19 1355 06/12/19 0228  Weight: 90.7 kg 91.8 kg    Examination:  General exam: Lying in bed comfortable, NAD Respiratory system: Clear to auscultation. Respiratory effort normal. Cardiovascular system:RRR. No murmurs, rubs, gallops. Gastrointestinal system: Abdomen is nondistended, soft and nontender normal bowel sounds heard.  Mildly tender at suprapubic area Central nervous system: Alert and oriented x3. No focal neurological deficits. Extremities: No edema or cyanosis, left leg wrapped, pull dressing down no erythema, no edema Skin: Warm and dry Psychiatry: Judgement and insight appear normal. Mood & affect appropriate.    Data Reviewed: I have personally reviewed following labs and imaging studies  CBC: Recent Labs  Lab 06/11/19 1420 06/12/19 0456 06/13/19 0516 06/14/19 0539 06/15/19 0438  WBC 9.4 5.2 6.5 6.4 8.1  NEUTROABS 7.4  --  4.6 4.6 6.0  HGB 13.3 11.6* 11.6* 11.2* 11.3*  HCT 40.7 35.9* 35.4* 34.2* 34.5*  MCV 103.0* 104.1* 103.2* 102.1* 102.4*  PLT 263 187 207 208 222   Basic Metabolic Panel: Recent Labs  Lab 06/12/19 0456 06/13/19 0516 06/14/19 0539 06/15/19 0438 06/17/19 0653  NA 140 136 138 139 138  K 4.3 4.2 4.3 4.5 4.0  CL 109 107 109 108 101  CO2 23 20* 23 24 25   GLUCOSE 93 96 104* 110* 93  BUN 44* 25* 22 21 33*  CREATININE 1.41* 0.87 0.91 1.10* 1.79*  CALCIUM 8.9 9.6 9.9 9.7 10.3  MG  --  1.4* 1.8 1.5*  --    GFR: Estimated Creatinine Clearance: 30.5 mL/min (A) (by C-G formula based on SCr of 1.79 mg/dL (H)). Liver Function Tests: Recent Labs  Lab 06/11/19 1420  AST 22  ALT 21  ALKPHOS 65  BILITOT 0.9  PROT 7.5  ALBUMIN 3.7   No results for input(s): LIPASE, AMYLASE in the last 168 hours. No results for input(s): AMMONIA in the last 168 hours. Coagulation Profile: No results for input(s): INR, PROTIME in the last 168 hours. Cardiac  Enzymes: No results for input(s): CKTOTAL, CKMB, CKMBINDEX, TROPONINI in the last 168 hours. BNP (last 3 results) No results for input(s): PROBNP in the last 8760 hours. HbA1C: No results for input(s): HGBA1C in the last 72 hours. CBG: No results for input(s): GLUCAP in the last 168 hours. Lipid Profile: No results for input(s): CHOL, HDL, LDLCALC, TRIG, CHOLHDL, LDLDIRECT in the last 72 hours. Thyroid Function Tests: No results for input(s): TSH, T4TOTAL, FREET4, T3FREE, THYROIDAB in the last 72 hours. Anemia Panel: No results for input(s): VITAMINB12, FOLATE, FERRITIN, TIBC, IRON, RETICCTPCT in the last 72 hours. Sepsis Labs: Recent Labs  Lab 06/11/19 1355 06/11/19 1608  LATICACIDVEN 1.4 1.4    Recent Results (from the past 240 hour(s))  Aerobic Culture (superficial specimen)     Status: None   Collection Time: 06/07/19 10:00 AM   Specimen: Leg  Result Value Ref Range Status   Specimen Description   Final    LEG LOWER LEFT LEG Performed at [facility], [address], [place], [place]    Special Requests   Final  NONE Performed at [facility], [address] [place], [place] [address]    Gram Stain   Final    RARE WBC PRESENT, PREDOMINANTLY PMN ABUNDANT GRAM POSITIVE COCCI MODERATE YEAST Performed at [facility], [address], [place], [place] [address]    Culture   Final    ABUNDANT ENTEROCOCCUS FAECALIS MODERATE CANDIDA ALBICANS    Report Status 06/10/2019 FINAL  Final   Organism ID, Bacteria ENTEROCOCCUS FAECALIS  Final      Susceptibility   Enterococcus faecalis - MIC*    AMPICILLIN <=2 SENSITIVE Sensitive     VANCOMYCIN 1 SENSITIVE Sensitive     GENTAMICIN SYNERGY SENSITIVE Sensitive     * ABUNDANT ENTEROCOCCUS FAECALIS  Blood Culture (routine x 2)     Status: None   Collection Time: 06/11/19  4:07 PM   Specimen: BLOOD  Result Value Ref Range Status   Specimen Description BLOOD RIGHT ANTECUBITAL  Final    Special Requests   Final    BOTTLES DRAWN AEROBIC AND ANAEROBIC Blood Culture adequate volume   Culture   Final    NO GROWTH 5 DAYS Performed at [facility], [address], [place], [place] [address]    Report Status 06/16/2019 FINAL  Final  Blood Culture (routine x 2)     Status: None   Collection Time: 06/11/19  4:08 PM   Specimen: BLOOD  Result Value Ref Range Status   Specimen Description BLOOD LEFT ANTECUBITAL  Final   Special Requests   Final    BOTTLES DRAWN AEROBIC AND ANAEROBIC Blood Culture adequate volume   Culture   Final    NO GROWTH 5 DAYS Performed at [facility], [address] [place], [place] [address]    Report Status 06/16/2019 FINAL  Final  SARS CORONAVIRUS 2 (TAT 6-24 HRS) Nasopharyngeal Nasopharyngeal Swab     Status: None   Collection Time: 06/11/19  5:05 PM   Specimen: Nasopharyngeal Swab  Result Value Ref Range Status   SARS Coronavirus 2 NEGATIVE NEGATIVE Final    Comment: (NOTE) SARS-CoV-2 target nucleic acids are NOT DETECTED. The SARS-CoV-2 RNA is generally detectable in upper and lower respiratory specimens during the acute phase of infection. Negative results do not preclude SARS-CoV-2 infection, do not rule out co-infections with other pathogens, and should not be used as the sole basis for treatment or other patient management decisions. Negative results must be combined with clinical observations, patient history, and epidemiological information. The expected result is Negative. Fact Sheet for Patients: HairSlick.no Fact Sheet for Healthcare Providers: quierodirigir.com This test is not yet approved or cleared by the [place] FDA and  has been authorized for detection and/or diagnosis of SARS-CoV-2 by FDA under an Emergency Use Authorization (EUA). This EUA will remain  in effect (meaning this test can be used) for the duration of the COVID-19 declaration  under Section 56 4(b)(1) of the Act, 21 U.S.C. section 360bbb-3(b)(1), unless the authorization is terminated or revoked sooner. Performed at [facility], [address], [place], [place] [address]   MRSA PCR Screening     Status: None   Collection Time: 06/12/19  3:14 AM   Specimen: Nasopharyngeal  Result Value Ref Range Status   MRSA by PCR NEGATIVE NEGATIVE Final    Comment:        The GeneXpert MRSA Assay (FDA approved for NASAL specimens only), is one component of a comprehensive MRSA colonization surveillance program. It is not intended  to diagnose MRSA infection nor to guide or monitor treatment for MRSA infections. Performed at [facility], [address] [place], [place] [address]          Radiology Studies: No results found.      Scheduled Meds: . apixaban  5 mg Oral BID  . vitamin C  1,000 mg Oral Daily  . calcium-vitamin D  1 tablet Oral BID  . colchicine  0.6 mg Oral BID  . estradiol  1.5 mg Oral Daily  . flecainide  100 mg Oral BID  . FLUoxetine  20 mg Oral Daily  . fluticasone  2 spray Each Nare QPM  . furosemide  40 mg Oral Daily  . hydrALAZINE  25 mg Oral TID  . metoprolol tartrate  12.5 mg Oral BID  . montelukast  10 mg Oral QHS  . oxybutynin  5 mg Oral QPM  . pantoprazole  40 mg Oral Daily  . pramipexole  0.25 mg Oral QID  . pyridoxine  200 mg Oral Daily  . sucralfate  1 g Oral TID  . traZODone  100 mg Oral QHS   Continuous Infusions: . sodium chloride Stopped (06/15/19 1838)  . vancomycin Stopped (06/16/19 1656)     LOS: 5 days    Time spent: 45 minutes with more than 50% COC    Lynn Ito, MD [facility]   If 7PM-7AM, please contact night-coverage [email or web address] 06/17/2019, 8:20 AM  Patient ID: [name], female   DOB: January 31, 1941, 79 y.o.   MRN: [id number]

## 2019-06-17 NOTE — Evaluation (Addendum)
Physical Therapy Evaluation Patient Details Name: [name] MRN: [id number] DOB: [birth year] Today's Date: 06/17/2019   History of Present Illness  Pt is a 79 y.o. female presenting to hospital 06/11/19 with infected leg wound; pt admitted with cellulitis L LE (wound x5 weeks), hypercalcemia, AKI, and noted to have possible gout flare L ankle.  PMH includes a-fib on Eliquis, htn, anxiety, PNA, abdominal surgery, arm surgery, R knee surgery, R shoulder surgery.  Clinical Impression  Prior to hospital admission, pt was most recently sitting on seat of 4ww and pushing herself around home (limited mobility d/t L LE pain); pt lives alone but reports her friend Zigmund Daniel) and son and son's wife are available to provide 24/7 assist upon hospital discharge.  Pt initially declining therapy d/t migraine, abdominal pain, nausea, and L LE pain but then agreeable to getting to [facility] d/t needing to toilet.  Pt reporting she was finally able to put weight on her L LE during transfer to [facility] and after finishing toileting pt reporting wanting to walk.  Currently pt is modified independent semi-supine to sitting edge of bed; therapist provided hand hold assist to perform stand step turn bed to [facility], and then CGA to stand up to walker and walk 15 feet with RW.  Ambulation distance limited d/t nausea.  Nurse notified of pt's pain and nausea.  Pt would benefit from skilled PT to address noted impairments and functional limitations (see below for any additional details).  Upon hospital discharge, pt would benefit from [place] and initial 24/7 assist (which pt reports having available).   Patient suffers from L LE cellulitis with wound which impairs her ability to perform daily activities like toileting, dressing, grooming, bathing in the home. A cane, walker, crutch will not resolve the patient's issue with performing activities of daily living. A lightweight wheelchair is required/recommended and will allow patient to safely perform  daily activities.   Patient can safely propel the wheelchair in the home or has a caregiver who can provide assistance.      Follow Up Recommendations Home health PT;Supervision/Assistance - 24 hour    Equipment Recommendations  Rolling walker with 5" wheels;3in1 (PT);Wheelchair (measurements PT);Wheelchair cushion (measurements PT)    Recommendations for Other Services       Precautions / Restrictions Precautions Precautions: Fall Precaution Comments: wound L LE Restrictions Weight Bearing Restrictions: No      Mobility  Bed Mobility Overal bed mobility: Needs Assistance Bed Mobility: Supine to Sit;Sit to Supine     Supine to sit: Modified independent (Device/Increase time) Sit to supine: Min assist   General bed mobility comments: mild increased effort to perform semi-supine to sitting edge of bed; minimal assist for LE's sit to semi-supine (2 assist to boost pt up in bed to improve comfort)  Transfers Overall transfer level: Needs assistance Equipment used: None;1 person hand held assist Transfers: Stand Pivot Transfers;Sit to/from Stand Sit to Stand: Min guard Stand pivot transfers: Min assist       General transfer comment: stand step turn bed to [facility] with hand hold assist; CGA with standing from BSC up to RW and sitting down onto bed post ambulation  Ambulation/Gait Ambulation/Gait assistance: Min guard Gait Distance (Feet): 15 Feet Assistive device: Rolling walker (2 wheeled)   Gait velocity: decreased   General Gait Details: step to gait pattern; steady with walker; limited distance d/t nausea  Stairs            Wheelchair Mobility    Modified Rankin (Stroke  Patients Only)       Balance Overall balance assessment: Needs assistance Sitting-balance support: No upper extremity supported;Feet supported Sitting balance-Leahy Scale: Good Sitting balance - Comments: steady sitting reaching within BOS   Standing balance support: Single extremity  supported Standing balance-Leahy Scale: Fair Standing balance comment: pt requiring at least single UE support for dynamic standing balance                             Pertinent Vitals/Pain Pain Assessment: 0-10 Pain Score: 10-Worst pain ever Pain Location: 9/10 migraine; 10/10 L LE pain Pain Descriptors / Indicators: Headache;Aching;Tender Pain Intervention(s): Limited activity within patient's tolerance;Monitored during session;Premedicated before session;Repositioned;Patient requesting pain meds-RN notified;Other (comment)(pt already with hospital heating pad in room for neck)    Home Living Family/patient expects to be discharged to:: Private residence Living Arrangements: Alone Available Help at Discharge: Family;Friend(s);Available 24 hours/day Type of Home: House Home Access: Ramped entrance     Home Layout: One level Home Equipment: Walker - 4 wheels;Walker - 2 wheels;Shower seat;Grab bars - tub/shower      Prior Function Level of Independence: Independent with assistive device(s)         Comments: Pt most recently sitting on seat of 4ww and pushing herself around d/t L LE pain.  Pt has a friend Joyce Gross) and son and son's wife who are able to provide 24/7 assist as needed.     Hand Dominance        Extremity/Trunk Assessment   Upper Extremity Assessment Upper Extremity Assessment: Generalized weakness    Lower Extremity Assessment Lower Extremity Assessment: Generalized weakness;LLE deficits/detail LLE Deficits / Details: L ankle DF AROM to neutral (limited d/t pain from wound)    Cervical / Trunk Assessment Cervical / Trunk Assessment: Normal  Communication   Communication: No difficulties  Cognition Arousal/Alertness: Awake/alert Behavior During Therapy: WFL for tasks assessed/performed Overall Cognitive Status: Within Functional Limits for tasks assessed                                        General Comments General  comments (skin integrity, edema, etc.): L LE bandage in place.  Nursing cleared pt for participation in physical therapy.  Pt agreeable to PT session.    Exercises     Assessment/Plan    PT Assessment Patient needs continued PT services  PT Problem List Decreased strength;Decreased range of motion;Decreased activity tolerance;Decreased balance;Decreased mobility;Decreased knowledge of use of DME;Pain;Decreased skin integrity       PT Treatment Interventions DME instruction;Gait training;Functional mobility training;Therapeutic activities;Therapeutic exercise;Balance training;Patient/family education    PT Goals (Current goals can be found in the Care Plan section)  Acute Rehab PT Goals Patient Stated Goal: to feel better and go home PT Goal Formulation: With patient Time For Goal Achievement: 07/01/19 Potential to Achieve Goals: Good    Frequency Min 2X/week   Barriers to discharge        Co-evaluation               AM-PAC PT "6 Clicks" Mobility  Outcome Measure Help needed turning from your back to your side while in a flat bed without using bedrails?: None Help needed moving from lying on your back to sitting on the side of a flat bed without using bedrails?: None Help needed moving to and from a bed to a chair (including  a wheelchair)?: A Little Help needed standing up from a chair using your arms (e.g., wheelchair or bedside chair)?: A Little Help needed to walk in hospital room?: A Little Help needed climbing 3-5 steps with a railing? : A Lot 6 Click Score: 19    End of Session Equipment Utilized During Treatment: Gait belt Activity Tolerance: Patient limited by fatigue;Patient limited by pain;Other (comment)(Limited d/t nausea) Patient left: in bed;with call bell/phone within reach;with bed alarm set Nurse Communication: Mobility status;Precautions;Patient requests pain meds;Other (comment)(Pt's pain and nausea; also pt reporting difficulting having BM) PT  Visit Diagnosis: Other abnormalities of gait and mobility (R26.89);Muscle weakness (generalized) (M62.81);Difficulty in walking, not elsewhere classified (R26.2);Pain Pain - Right/Left: Left Pain - part of body: Leg    Time: 4193-7902 PT Time Calculation (min) (ACUTE ONLY): 35 min   Charges:   PT Evaluation $PT Eval Low Complexity: 1 Low PT Treatments $Therapeutic Activity: 8-22 mins        Hendricks Limes, PT 06/17/19, 12:05 PM

## 2019-06-18 ENCOUNTER — Telehealth: Payer: Self-pay | Admitting: Cardiovascular Disease

## 2019-06-18 LAB — BASIC METABOLIC PANEL
Anion gap: 11 (ref 5–15)
BUN: 45 mg/dL — ABNORMAL HIGH (ref 8–23)
CO2: 25 mmol/L (ref 22–32)
Calcium: 10.4 mg/dL — ABNORMAL HIGH (ref 8.9–10.3)
Chloride: 100 mmol/L (ref 98–111)
Creatinine, Ser: 2.06 mg/dL — ABNORMAL HIGH (ref 0.44–1.00)
GFR calc Af Amer: 26 mL/min — ABNORMAL LOW (ref >60)
GFR calc non Af Amer: 23 mL/min — ABNORMAL LOW (ref >60)
Glucose, Bld: 93 mg/dL (ref 70–99)
Potassium: 3.7 mmol/L (ref 3.5–5.1)
Sodium: 136 mmol/L (ref 135–145)

## 2019-06-18 LAB — CBC
HCT: 40.5 % (ref 36.0–46.0)
Hemoglobin: 13.4 g/dL (ref 12.0–15.0)
MCH: 33.5 pg (ref 26.0–34.0)
MCHC: 33.1 g/dL (ref 30.0–36.0)
MCV: 101.3 fL — ABNORMAL HIGH (ref 80.0–100.0)
Platelets: 294 10*3/uL (ref 150–400)
RBC: 4 MIL/uL (ref 3.87–5.11)
RDW: 13 % (ref 11.5–15.5)
WBC: 8.9 10*3/uL (ref 4.0–10.5)
nRBC: 0 % (ref 0.0–0.2)

## 2019-06-18 MED ORDER — SODIUM CHLORIDE 0.9 % IV SOLN: INTRAVENOUS | Status: DC

## 2019-06-18 MED ORDER — SENNOSIDES-DOCUSATE SODIUM 8.6-50 MG PO TABS
1.0000 | ORAL_TABLET | Freq: Every day | ORAL | Status: DC
  Administered 2019-06-18: 21:00:00 1 via ORAL
  Filled 2019-06-18: qty 1

## 2019-06-18 NOTE — Telephone Encounter (Signed)
Called x 2 and it would not go through

## 2019-06-18 NOTE — Progress Notes (Signed)
PROGRESS NOTE    [name]  ASN:[id number] DOB: 1941/02/04 DOA: 06/11/2019  PCP: Rusty Aus, MD    LOS - 6   Brief Narrative:  79 y.o. female with medical history significant of A. fib on Eliquis, hypertension, GERD and anxiety who presented to the ED from the wound care clinic on 1/19 with infected leg wound despite outpatient treatment with doxycycline.  Wound present for 5 weeks, PCP sent to wound clinic last week since worsening.  Wound culture was obtained and grew Enterococcus faecalis, started on Doxy.  At follow up visit, wound infection had progressed, patient referred to ED for admission and IV antibiotics.  Started on IV Vancomycin for total of 7 days and admitted to hospitalist service with wound care consult.  Assessment & Plan:   Principal Problem:   Cellulitis of left lower extremity Active Problems:   Atrial fibrillation (HCC)   Edema   Hypertension   Anxiety   GERD (gastroesophageal reflux disease)   AKI (acute kidney injury) ([place])   Hypercalcemia due to a drug   Cellulitis of left lower extremity  wound culture obtained outpatient at the wound clinic grew Enterococcus faecalis. Failed outpatient doxycycline.   Vitals within normal limits, no sign of sepsis upon admission. Patient has penicillin allergy --Completed course of vancomycin 7 days.  Per documentation of 7-day course.  --WOC consult, dressing recommendations provided.  WOC not following actively.  Reconsult as needed -Clinically, slowly improving  AKI-new. Creatinine increased to 1.79...>today 2.06 Vanco trough level was stable.  Spoked to pharmacy vancomycin dosing is stable High patient bladder, voided 500 but still retaining 496..>foley placed on 1/25 AKI likely combination of urinary retention, lasix constipation Had BM Trial of voiding in am D/c lasix Continue bowel regimen We will start gentle hydration   Possible gout flare Started on colchicine 0.6 mg twice daily...> Will DC  since with AKI for now Improving  Hypercalcemia  10.4 on admission Most likely due to thiazide diuretic.   -Improved with hydration -stop metolazone & re-check in 3 months   Atrial fibrillation -rate controlled --Continue Eliquis, metoprolol, flecainide  Edema  --Home Lasix dose was resumed initially held due to AKI --stop metolazone on discharge due to hypercalcemia  Hypertension  --per patient, taken off ARB, started on spironolactone recently --held spironolactone due to AKI --Continue to hold aldactone due to relative hypotension --Continue home hydralazine, increase if need to as tolerated --As needed IV labetalol and or hydralazine   Depression/anxiety --Continue home Xanax and Prozac  GERD (gastroesophageal reflux disease) --Continue home PPI and Carafate  Insomnia --Continue home trazodone   DVT prophylaxis: On Eliquis Code Status: Full  Family Communication: None at bedside.  Disposition Plan:  Voiding trial in a.m.  Check labs.  If creatinine is improving and can void can likely discharge in a.m.  PT recommended home health   Subjective: Feels better with Foley placed.  Does not have suprapubic fullness anymore.  Denies any other symptoms. Objective: Vitals:   06/17/19 0359 06/17/19 1326 06/17/19 2136 06/18/19 0514  BP: (!) 107/55 122/79 124/66 (!) 109/59  Pulse: 64 98 98 (!) 108  Resp: 18 18 18 16   Temp: 97.6 F (36.4 C)  98.2 F (36.8 C) 98.9 F (37.2 C)  TempSrc: Oral  Oral Oral  SpO2: 94% 91% 95% 92%  Weight:      Height:        Intake/Output Summary (Last 24 hours) at 06/18/2019 0749 Last data filed at 06/17/2019 2008  Gross per 24 hour  Intake 440 ml  Output 500 ml  Net -60 ml   Filed Weights   06/11/19 1355 06/12/19 0228  Weight: 90.7 kg 91.8 kg    Examination:  General exam: Lying in bed comfortable, NAD  Respiratory system: Clear to auscultation. Respiratory effort normal.  No wheeze rales rhonchi's Cardiovascular  system:RRR. No murmurs, rubs, gallops. Gastrointestinal system: Abdomen is nondistended, soft and nontender normal bowel sounds heard.   GU: Foley in place with semidark urine Central nervous system: Alert and oriented x3. No focal neurological deficits. Extremities: No edema or cyanosis, left leg wrapped, pull dressing down no erythema, no edema Skin: Warm and dry Psychiatry: Judgement and insight appear normal. Mood & affect appropriate.    Data Reviewed: I have personally reviewed following labs and imaging studies  CBC: Recent Labs  Lab 06/11/19 1420 06/12/19 0456 06/13/19 0516 06/14/19 0539 06/15/19 0438  WBC 9.4 5.2 6.5 6.4 8.1  NEUTROABS 7.4  --  4.6 4.6 6.0  HGB 13.3 11.6* 11.6* 11.2* 11.3*  HCT 40.7 35.9* 35.4* 34.2* 34.5*  MCV 103.0* 104.1* 103.2* 102.1* 102.4*  PLT 263 187 207 208 222   Basic Metabolic Panel: Recent Labs  Lab 06/13/19 0516 06/14/19 0539 06/15/19 0438 06/17/19 0653 06/18/19 0527  NA 136 138 139 138 136  K 4.2 4.3 4.5 4.0 3.7  CL 107 109 108 101 100  CO2 20* 23 24 25 25   GLUCOSE 96 104* 110* 93 93  BUN 25* 22 21 33* 45*  CREATININE 0.87 0.91 1.10* 1.79* 2.06*  CALCIUM 9.6 9.9 9.7 10.3 10.4*  MG 1.4* 1.8 1.5*  --   --    GFR: Estimated Creatinine Clearance: 26.5 mL/min (A) (by C-G formula based on SCr of 2.06 mg/dL (H)). Liver Function Tests: Recent Labs  Lab 06/11/19 1420  AST 22  ALT 21  ALKPHOS 65  BILITOT 0.9  PROT 7.5  ALBUMIN 3.7   No results for input(s): LIPASE, AMYLASE in the last 168 hours. No results for input(s): AMMONIA in the last 168 hours. Coagulation Profile: No results for input(s): INR, PROTIME in the last 168 hours. Cardiac Enzymes: No results for input(s): CKTOTAL, CKMB, CKMBINDEX, TROPONINI in the last 168 hours. BNP (last 3 results) No results for input(s): PROBNP in the last 8760 hours. HbA1C: No results for input(s): HGBA1C in the last 72 hours. CBG: No results for input(s): GLUCAP in the last 168  hours. Lipid Profile: No results for input(s): CHOL, HDL, LDLCALC, TRIG, CHOLHDL, LDLDIRECT in the last 72 hours. Thyroid Function Tests: No results for input(s): TSH, T4TOTAL, FREET4, T3FREE, THYROIDAB in the last 72 hours. Anemia Panel: No results for input(s): VITAMINB12, FOLATE, FERRITIN, TIBC, IRON, RETICCTPCT in the last 72 hours. Sepsis Labs: Recent Labs  Lab 06/11/19 1355 06/11/19 1608  LATICACIDVEN 1.4 1.4    Recent Results (from the past 240 hour(s))  Blood Culture (routine x 2)     Status: None   Collection Time: 06/11/19  4:07 PM   Specimen: BLOOD  Result Value Ref Range Status   Specimen Description BLOOD RIGHT ANTECUBITAL  Final   Special Requests   Final    BOTTLES DRAWN AEROBIC AND ANAEROBIC Blood Culture adequate volume   Culture   Final    NO GROWTH 5 DAYS Performed at [facility], [address], [place], [place]    Report Status 06/16/2019 FINAL  Final  Blood Culture (routine x 2)     Status: None  Collection Time: 06/11/19  4:08 PM   Specimen: BLOOD  Result Value Ref Range Status   Specimen Description BLOOD LEFT ANTECUBITAL  Final   Special Requests   Final    BOTTLES DRAWN AEROBIC AND ANAEROBIC Blood Culture adequate volume   Culture   Final    NO GROWTH 5 DAYS Performed at [facility], [address] [place], [place] [address]    Report Status 06/16/2019 FINAL  Final  SARS CORONAVIRUS 2 (TAT 6-24 HRS) Nasopharyngeal Nasopharyngeal Swab     Status: None   Collection Time: 06/11/19  5:05 PM   Specimen: Nasopharyngeal Swab  Result Value Ref Range Status   SARS Coronavirus 2 NEGATIVE NEGATIVE Final    Comment: (NOTE) SARS-CoV-2 target nucleic acids are NOT DETECTED. The SARS-CoV-2 RNA is generally detectable in upper and lower respiratory specimens during the acute phase of infection. Negative results do not preclude SARS-CoV-2 infection, do not rule out co-infections with other pathogens, and should not be used  as the sole basis for treatment or other patient management decisions. Negative results must be combined with clinical observations, patient history, and epidemiological information. The expected result is Negative. Fact Sheet for Patients: HairSlick.no Fact Sheet for Healthcare Providers: quierodirigir.com This test is not yet approved or cleared by the [place] FDA and  has been authorized for detection and/or diagnosis of SARS-CoV-2 by FDA under an Emergency Use Authorization (EUA). This EUA will remain  in effect (meaning this test can be used) for the duration of the COVID-19 declaration under Section 56 4(b)(1) of the Act, 21 U.S.C. section 360bbb-3(b)(1), unless the authorization is terminated or revoked sooner. Performed at [facility], [address], [place], [place] [address]   MRSA PCR Screening     Status: None   Collection Time: 06/12/19  3:14 AM   Specimen: Nasopharyngeal  Result Value Ref Range Status   MRSA by PCR NEGATIVE NEGATIVE Final    Comment:        The GeneXpert MRSA Assay (FDA approved for NASAL specimens only), is one component of a comprehensive MRSA colonization surveillance program. It is not intended to diagnose MRSA infection nor to guide or monitor treatment for MRSA infections. Performed at [facility], [address] [place], [place] [address]          Radiology Studies: No results found.      Scheduled Meds: . apixaban  5 mg Oral BID  . vitamin C  1,000 mg Oral Daily  . calcium-vitamin D  1 tablet Oral BID  . colchicine  0.6 mg Oral BID  . estradiol  1.5 mg Oral Daily  . flecainide  100 mg Oral BID  . FLUoxetine  20 mg Oral Daily  . fluticasone  2 spray Each Nare QPM  . hydrALAZINE  25 mg Oral TID  . metoprolol tartrate  12.5 mg Oral BID  . montelukast  10 mg Oral QHS  . oxybutynin  5 mg Oral QPM  . pantoprazole  40 mg Oral Daily  .  polyethylene glycol  17 g Oral Daily  . pramipexole  0.25 mg Oral QID  . pyridoxine  200 mg Oral Daily  . sucralfate  1 g Oral TID  . traZODone  100 mg Oral QHS   Continuous Infusions: . sodium chloride Stopped (06/15/19 1838)  . sodium chloride       LOS: 6 days    Time spent: 45 minutes with more than 50% COC  Lynn Ito, MD [facility]   If 7PM-7AM, please contact night-coverage [email or web address] 06/18/2019, 7:49 AM

## 2019-06-18 NOTE — Telephone Encounter (Signed)
Called back to see if patients son was able to reach the nurse to have provider call. Advised that if he was not able to reach them then to please give [place] a call back. He did inquire about how to assess dementia and her urinary issues outside of hospital. Reviewed that it would need to go through her primary care provider. He verbalized understanding with no further questions at this time.

## 2019-06-18 NOTE — Telephone Encounter (Signed)
Spoke with Kary Kos on floor caring for this patient. Reviewed that patients son called in with some concerns and that we have tried calling him multiple times with no success. She states that if he calls back that they let him know we have not been able to reach him.

## 2019-06-18 NOTE — Telephone Encounter (Signed)
Spoke with patients son and advised that he needs to speak with the physician caring for his mother about any questions and concerns since she is admitted. Let him know that it does not appear that we are seeing her in the hospital and if she is having problems with urination to please reach out to them for discussion of her care. He verbalized understanding and was appreciative for the call.

## 2019-06-18 NOTE — Telephone Encounter (Signed)
Patient son available now for a call back

## 2019-06-18 NOTE — Telephone Encounter (Signed)
No answer. No voicemail. 

## 2019-06-18 NOTE — Progress Notes (Signed)
Physical Therapy Treatment Patient Details Name: [name] MRN: [id number] DOB: Mar 16, 1941 Today's Date: 06/18/2019    History of Present Illness Pt is a 79 y.o. female presenting to hospital 06/11/19 with infected leg wound; pt admitted with cellulitis L LE (wound x5 weeks), hypercalcemia, AKI, and noted to have possible gout flare L ankle.  PMH includes a-fib on Eliquis, htn, anxiety, PNA, abdominal surgery, arm surgery, R knee surgery, R shoulder surgery.    PT Comments    Pt is making good progress towards goals with ability to ambulate in room. Still limited by nausea, however demonstrates safe technique with RW. Agreeable to perform there-ex in supine position. Will have 24/7 care at home. Will continue to progress as able.   Follow Up Recommendations  Home health PT;Supervision/Assistance - 24 hour     Equipment Recommendations  Rolling walker with 5" wheels;3in1 (PT);Wheelchair (measurements PT);Wheelchair cushion (measurements PT)    Recommendations for Other Services       Precautions / Restrictions Precautions Precautions: Fall Restrictions Weight Bearing Restrictions: No    Mobility  Bed Mobility Overal bed mobility: Needs Assistance Bed Mobility: Supine to Sit;Sit to Supine     Supine to sit: Min guard     General bed mobility comments: safe technique with cues for sitting at EOB,. Once seated at EOB, slight nausea noted with seated rest break.  Transfers Overall transfer level: Needs assistance Equipment used: Rolling walker (2 wheeled) Transfers: Sit to/from Stand Sit to Stand: Min assist         General transfer comment: safe technique with RW. Fatigues quickly and sits prematurely. 2nd attempt, pt able to stand safely  Ambulation/Gait Ambulation/Gait assistance: Min guard Gait Distance (Feet): 15 Feet Assistive device: Rolling walker (2 wheeled) Gait Pattern/deviations: Step-to pattern     General Gait Details: ambulated in room with step to  gait pattern. Imminent need to use bathroom limited further ambulation.   Stairs             Wheelchair Mobility    Modified Rankin (Stroke Patients Only)       Balance Overall balance assessment: Needs assistance Sitting-balance support: No upper extremity supported;Feet supported Sitting balance-Leahy Scale: Good     Standing balance support: Bilateral upper extremity supported Standing balance-Leahy Scale: Fair                              Cognition Arousal/Alertness: Awake/alert Behavior During Therapy: WFL for tasks assessed/performed Overall Cognitive Status: Within Functional Limits for tasks assessed                                        Exercises Other Exercises Other Exercises: Pt ambulated to [facility] in room. Able to perform self hygiene with cga. Slight unsteadiness with positioning, needs cues for sequencing. Had positive BM Other Exercises: Supine ther-ex performed on B LE including AP, quad sets, SLRs, hip abd/add, SAQ. and knee flexion. 10 reps with slow performance due to nausea symptoms. CGA     General Comments        Pertinent Vitals/Pain Pain Assessment: No/denies pain    Home Living                      Prior Function            PT Goals (current goals can  now be found in the care plan section) Acute Rehab PT Goals Patient Stated Goal: to feel better and go home PT Goal Formulation: With patient Time For Goal Achievement: 07/01/19 Potential to Achieve Goals: Good Progress towards PT goals: Progressing toward goals    Frequency    Min 2X/week      PT Plan Current plan remains appropriate    Co-evaluation              AM-PAC PT "6 Clicks" Mobility   Outcome Measure  Help needed turning from your back to your side while in a flat bed without using bedrails?: None Help needed moving from lying on your back to sitting on the side of a flat bed without using bedrails?: None Help  needed moving to and from a bed to a chair (including a wheelchair)?: A Little Help needed standing up from a chair using your arms (e.g., wheelchair or bedside chair)?: A Little Help needed to walk in hospital room?: A Little Help needed climbing 3-5 steps with a railing? : A Lot 6 Click Score: 19    End of Session Equipment Utilized During Treatment: Gait belt Activity Tolerance: (limited due to nausea) Patient left: in bed;with call bell/phone within reach;with bed alarm set Nurse Communication: Mobility status;Precautions;Patient requests pain meds;Other (comment) PT Visit Diagnosis: Other abnormalities of gait and mobility (R26.89);Muscle weakness (generalized) (M62.81);Difficulty in walking, not elsewhere classified (R26.2);Pain Pain - Right/Left: Left Pain - part of body: Leg     Time: 3295-1884 PT Time Calculation (min) (ACUTE ONLY): 38 min  Charges:  $Gait Training: 8-22 mins $Therapeutic Exercise: 8-22 mins $Therapeutic Activity: 8-22 mins                     [name], PT, DPT [phone]    [name] 06/18/2019, 4:52 PM

## 2019-06-18 NOTE — Telephone Encounter (Signed)
Patient son calling, patient is in hospital Patient is having trouble emptying bladder and struggling with leg swelling  Would like to discuss if this could be related to each other  Please Ivar Drape to discuss

## 2019-06-19 LAB — BASIC METABOLIC PANEL
Anion gap: 13 (ref 5–15)
BUN: 40 mg/dL — ABNORMAL HIGH (ref 8–23)
CO2: 21 mmol/L — ABNORMAL LOW (ref 22–32)
Calcium: 9.8 mg/dL (ref 8.9–10.3)
Chloride: 105 mmol/L (ref 98–111)
Creatinine, Ser: 1.71 mg/dL — ABNORMAL HIGH (ref 0.44–1.00)
GFR calc Af Amer: 33 mL/min — ABNORMAL LOW (ref >60)
GFR calc non Af Amer: 29 mL/min — ABNORMAL LOW (ref >60)
Glucose, Bld: 93 mg/dL (ref 70–99)
Potassium: 3.2 mmol/L — ABNORMAL LOW (ref 3.5–5.1)
Sodium: 139 mmol/L (ref 135–145)

## 2019-06-19 MED ORDER — FUROSEMIDE 40 MG PO TABS: 20.0000 mg | ORAL_TABLET | Freq: Every day | ORAL | 0 refills | Status: DC

## 2019-06-19 MED ORDER — POTASSIUM CHLORIDE CRYS ER 20 MEQ PO TBCR
40.0000 meq | EXTENDED_RELEASE_TABLET | Freq: Once | ORAL | Status: AC
  Administered 2019-06-19: 17:00:00 40 meq via ORAL
  Filled 2019-06-19: qty 2

## 2019-06-19 MED ORDER — PROCHLORPERAZINE EDISYLATE 10 MG/2ML IJ SOLN
10.0000 mg | Freq: Once | INTRAMUSCULAR | Status: AC
  Administered 2019-06-19: 10 mg via INTRAVENOUS
  Filled 2019-06-19: qty 2

## 2019-06-19 MED ORDER — COLCHICINE 0.6 MG PO TABS: 0.6000 mg | ORAL_TABLET | Freq: Every day | ORAL | 0 refills | Status: AC | PRN

## 2019-06-19 MED ORDER — CHLORHEXIDINE GLUCONATE CLOTH 2 % EX PADS
6.0000 | MEDICATED_PAD | Freq: Every day | CUTANEOUS | Status: DC
  Administered 2019-06-19: 10:00:00 6 via TOPICAL

## 2019-06-19 NOTE — Discharge Instructions (Signed)
Your Lasix dose has been decreased your spironolactone and metazolone have been held

## 2019-06-19 NOTE — Progress Notes (Addendum)
[facility]  - [place] [facility]   PATIENT NAME: [name]    MR#:  [id number]  DATE OF BIRTH:  04/29/1941  SUBJECTIVE:  Feels a lot better. Wants to remove foley and go home  REVIEW OF SYSTEMS:   Review of Systems  Constitutional: Negative for chills, fever and weight loss.  HENT: Negative for ear discharge, ear pain and nosebleeds.   Eyes: Negative for blurred vision, pain and discharge.  Respiratory: Negative for sputum production, shortness of breath, wheezing and stridor.   Cardiovascular: Negative for chest pain, palpitations, orthopnea and PND.  Gastrointestinal: Negative for abdominal pain, diarrhea, nausea and vomiting.  Genitourinary: Negative for frequency and urgency.  Musculoskeletal: Negative for back pain and joint pain.  Neurological: Positive for weakness. Negative for sensory change, speech change and focal weakness.  Psychiatric/Behavioral: Negative for depression and hallucinations. The patient is not nervous/anxious.    Tolerating Diet:yes Tolerating PT: HHPT  DRUG ALLERGIES:   Allergies  Allergen Reactions  . Ace Inhibitors Cough  . Albuterol Other (See Comments)    Unknown reaction  . Iodine Hives  . Minocycline Other (See Comments)    Onset 04/10/2001.  / unknown reaction  . Norvasc [Amlodipine Besylate] Hives  . Penicillins Hives    Did it involve swelling of the face/tongue/throat, SOB, or low BP? Unknown Did it involve sudden or severe rash/hives, skin peeling, or any reaction on the inside of your mouth or nose? Unknown Did you need to seek medical attention at a hospital or doctor's office? Unknown When did it last happen?>36 years ago If all above answers are "NO", may proceed with cephalosporin use.  Marland Kitchen Propoxyphene Hives    Onset 04/10/2001.   . Sulfa Antibiotics Diarrhea and Nausea Only  . Vicodin [Hydrocodone-Acetaminophen] Hives    Hives occur after 5 doses   . Amlodipine Rash  . Betadine [Povidone Iodine]  Rash and Other (See Comments)    blisters  . Clarithromycin Rash    VITALS:  Blood pressure 116/73, pulse 82, temperature 97.7 F (36.5 C), temperature source Oral, resp. rate 16, height 5\' 6"  (1.676 m), weight 91.8 kg, SpO2 93 %.  PHYSICAL EXAMINATION:   Physical Exam  GENERAL:  79 y.o.-year-old patient lying in the bed with no acute distress.  EYES: Pupils equal, round, reactive to light and accommodation. No scleral icterus.   HEENT: Head atraumatic, normocephalic. Oropharynx and nasopharynx clear.  NECK:  Supple, no jugular venous distention. No thyroid enlargement, no tenderness.  LUNGS: Normal breath sounds bilaterally, no wheezing, rales, rhonchi. No use of accessory muscles of respiration.  CARDIOVASCULAR: S1, S2 normal. No murmurs, rubs, or gallops.  ABDOMEN: Soft, nontender, nondistended. Bowel sounds present. No organomegaly or mass.  EXTREMITIES: No cyanosis, clubbing or edema b/l.   Left LE dressing + NEUROLOGIC: Cranial nerves II through XII are intact. No focal Motor or sensory deficits b/l.   PSYCHIATRIC:  patient is alert and oriented x 3.  SKIN: No obvious rash, lesion, or ulcer.   LABORATORY PANEL:  CBC Recent Labs  Lab 06/18/19 0527  WBC 8.9  HGB 13.4  HCT 40.5  PLT 294    Chemistries  Recent Labs  Lab 06/15/19 0438 06/17/19 0653 06/19/19 0511  NA 139   < > 139  K 4.5   < > 3.2*  CL 108   < > 105  CO2 24   < > 21*  GLUCOSE 110*   < > 93  BUN 21   < >  40*  CREATININE 1.10*   < > 1.71*  CALCIUM 9.7   < > 9.8  MG 1.5*  --   --    < > = values in this interval not displayed.   Cardiac Enzymes No results for input(s): TROPONINI in the last 168 hours. RADIOLOGY:  No results found. ASSESSMENT AND PLAN:  79 y.o.femalewith medical history significant ofA. fib on Eliquis, hypertension, GERD and anxiety who presented to the ED from the wound care clinic on 1/19 with infected leg wound despite outpatient treatment with doxycycline  Cellulitis of  left lower extremity -wound culture obtained outpatient at the wound clinic grew Enterococcus faecalis. Failed outpatient doxycycline.  -no sign of sepsis upon admission. -Patient has penicillin allergy --Completed course of vancomycin 7 days.   --WOC consult, dressing recommendations provided.  WOC not following actively.  [place] RN to do dressing changes -Clinically, slowly improving  AKI-new. Creatinine increased to 1.79...>today 2.06--1.7 -Vanco trough level was stable.  -High patient bladder, voided 500 but still retaining 496..>foley placed on 1/25 -AKI likely combination of urinary retention, lasix ,constipation -Had BM -Trial of voiding in am--foley removed--pt not voided yet -resumed lasix in next 3 days smaller dose--want to avoid leg edema -holding metolazone and spironolactone-- defer to PCP to resume it later if needed  Possible gout flare Prn colchicine  Hypercalcemia  10.4 on admission Most likely due to thiazide diuretic.  -Improved with hydration -stop metolazone   Atrial fibrillation-rate controlled --Continue Eliquis, metoprolol, flecainide  Edema --Home Lasix dose was resumed initially held due to AKI--now resume on 1/30 at 20 mg --stop metolazone and spironolactone on discharge due to hypercalcemia and rising creat  Hypertension --per patient, taken off ARB, started on spironolactone recently --held spironolactone due to AKI --Continue home hydralazine and BB, increase if need to as tolerated  Depression/anxiety --Continue home Xanax and Prozac  GERD (gastroesophageal reflux disease) --Continue home PPI and Carafate  Insomnia --Continue home trazodone   DVT prophylaxis:On Eliquis Code Status:Full Family Communication:friend at bedside Barrier for Disposition Plan: ongoing Voiding trial. Pt has not urinated since foley was removed this am and may need to place it back with out urology f/u - PT recommended home health  TOTAL  TIME TAKING CARE OF THIS PATIENT: *25* minutes.  >50% time spent on counselling and coordination of care  Note: This dictation was prepared with Dragon dictation along with smaller phrase technology. Any transcriptional errors that result from this process are unintentional.  Fritzi Mandes M.D    [facility]   CC: Primary care physician; Rusty Aus, MDPatient ID: [name], female   DOB: Apr 08, 1941, 79 y.o.   MRN: [id number]

## 2019-06-19 NOTE — TOC Transition Note (Signed)
Transition of Care [facility]) - CM/SW Discharge Note   Patient Details  Name: [name] MRN: [id number] Date of Birth: Sep 30, 1940  [facility] CM/SW Contact:  Chapman Fitch, RN Phone Number: 06/19/2019, 4:26 PM   Clinical Narrative:     Patient to discharge home today.  Has been weaned to RA  RNCM confined with Brad from [employer] that WC and BSC were delivered to the home yesterday  Barbara Cower with [facility] notified of discharge    Final next level of care: Home w Home Health Services Barriers to Discharge: No Barriers Identified   Patient Goals and CMS Choice        Discharge Placement                       Discharge Plan and Services   Discharge Planning Services: (P) CM Consult            DME Arranged: Wheelchair manual, 3-N-1   Date DME Agency Contacted: (P) 06/17/19   Representative spoke with at DME Agency: (P) Brad HH Arranged: PT, RN HH Agency: [facility] Date HH Agency Contacted: 06/19/19   Representative spoke with at [facility] Agency: Barbara Cower  Social Determinants of Health (SDOH) Interventions     Readmission Risk Interventions Readmission Risk Prevention Plan 06/17/2019  Transportation Screening Complete  HRI or Home Care Consult Complete  Palliative Care Screening Not Complete  Medication Review (RN Care Manager) Complete  Some recent data might be hidden

## 2019-06-19 NOTE — Discharge Summary (Signed)
[facility] NAME: [name]    MR#:  [id number]  DATE OF BIRTH:  23-May-1941  DATE OF ADMISSION:  06/11/2019 ADMITTING PHYSICIAN: Ezekiel Slocumb, DO  DATE OF DISCHARGE: 06/19/2019  PRIMARY CARE PHYSICIAN: Rusty Aus, MD    ADMISSION DIAGNOSIS:  Cellulitis of left lower extremity [L03.116]  DISCHARGE DIAGNOSIS:  Left lower extremity cellulitis  SECONDARY DIAGNOSIS:   Past Medical History:  Diagnosis Date  . A-fib ([place]) 2012, 2014  . Anxiety   . Arrhythmia    A-fib  . Bursitis    hips  . Chronic diastolic (congestive) heart failure ([place])   . Concussion   . Concussion July, 28, 2014  . GERD (gastroesophageal reflux disease)   . Hypertension   . Palpitations   . Pneumonia 2012    HOSPITAL COURSE:   79 y.o.femalewith medical history significant ofA. fib on Eliquis, hypertension, GERD and anxiety who presented to the ED from the wound care clinic on 1/19 with infected leg wound despite outpatient treatment with doxycycline  Cellulitis of left lower extremity -wound culture obtained outpatient at the wound clinic grew Enterococcus faecalis. Failed outpatient doxycycline.  -no sign of sepsis upon admission. -Patient has penicillin allergy --Completed course ofvancomycin 7days.  --WOC consult, dressing recommendations provided. WOC not following actively. Grover RN to do dressing changes -Clinically, slowly improving  AKI-new. Creatinine increased to 1.79...> 2.06--IVF--1.7 -Vanco trough levelwasstable.  - voided 500 but still retaining 496..>foley placed on 1/25--removed 1/27 -AKI likely combination of urinary retention, lasix ,constipation -Had BM x2  -Trial of voiding in am--foley removed--pt not voided yet--pt still requesting to go home. Bladder scan showed 80 cc only -resumed lasix in next 3 days smaller dose--want to avoid leg edema -holding metolazone and spironolactone-- defer to PCP to resume it  later if needed  Possible gout flare Prn colchicine  Hypercalcemia  10.4 on admission Most likely due to thiazide diuretic.  -Improved with hydration -stop metolazone   Atrial fibrillation-rate controlled --Continue Eliquis, metoprolol, flecainide  Edema --Home Lasix dose was resumed initially held due to AKI--now resume on 1/30 at 20 mg --stop metolazone and spironolactone on discharge due to hypercalcemia and rising creat  Hypertension --per patient, taken off ARB, started on spironolactone recently --held spironolactone due to AKI --Continue home hydralazine and BB, increase if need to as tolerated  Depression/anxiety --Continue home Xanax and Prozac  GERD (gastroesophageal reflux disease) --Continue home PPI and Carafate  Insomnia --Continue home trazodone  Pt is wanting to go home this evening. She is advissed to touch base with Dr Sabra Heck incase she has issues with voiding. D/c home with [facility]  CONSULTS OBTAINED:    DRUG ALLERGIES:   Allergies  Allergen Reactions  . Ace Inhibitors Cough  . Albuterol Other (See Comments)    Unknown reaction  . Iodine Hives  . Minocycline Other (See Comments)    Onset 04/10/2001.  / unknown reaction  . Norvasc [Amlodipine Besylate] Hives  . Penicillins Hives    Did it involve swelling of the face/tongue/throat, SOB, or low BP? Unknown Did it involve sudden or severe rash/hives, skin peeling, or any reaction on the inside of your mouth or nose? Unknown Did you need to seek medical attention at a hospital or doctor's office? Unknown When did it last happen?>36 years ago If all above answers are "NO", may proceed with cephalosporin use.  Marland Kitchen Propoxyphene Hives    Onset 04/10/2001.   . Sulfa Antibiotics  Diarrhea and Nausea Only  . Vicodin [Hydrocodone-Acetaminophen] Hives    Hives occur after 5 doses   . Amlodipine Rash  . Betadine [Povidone Iodine] Rash and Other (See Comments)    blisters  .  Clarithromycin Rash    DISCHARGE MEDICATIONS:   Allergies as of 06/19/2019      Reactions   Ace Inhibitors Cough   Albuterol Other (See Comments)   Unknown reaction   Iodine Hives   Minocycline Other (See Comments)   Onset 04/10/2001.  / unknown reaction   Norvasc [amlodipine Besylate] Hives   Penicillins Hives   Did it involve swelling of the face/tongue/throat, SOB, or low BP? Unknown Did it involve sudden or severe rash/hives, skin peeling, or any reaction on the inside of your mouth or nose? Unknown Did you need to seek medical attention at a hospital or doctor's office? Unknown When did it last happen?>36 years ago If all above answers are "NO", may proceed with cephalosporin use.   Propoxyphene Hives   Onset 04/10/2001.   Sulfa Antibiotics Diarrhea, Nausea Only   Vicodin [hydrocodone-acetaminophen] Hives   Hives occur after 5 doses   Amlodipine Rash   Betadine [povidone Iodine] Rash, Other (See Comments)   blisters   Clarithromycin Rash      Medication List    STOP taking these medications   doxycycline 100 MG capsule Commonly known as: VIBRAMYCIN   irbesartan 150 MG tablet Commonly known as: AVAPRO   metolazone 2.5 MG tablet Commonly known as: ZAROXOLYN   omeprazole 20 MG tablet Commonly known as: PRILOSEC OTC   spironolactone 25 MG tablet Commonly known as: ALDACTONE     TAKE these medications   ALPRAZolam 0.25 MG tablet Commonly known as: XANAX Take 0.25 mg by mouth 3 (three) times daily as needed for anxiety.   baclofen 10 MG tablet Commonly known as: LIORESAL Take 10 mg by mouth 3 (three) times daily as needed for muscle spasms.   beta carotene w/minerals tablet Take 1 tablet by mouth 2 (two) times daily.   Calcium Carb-Cholecalciferol 600-400 MG-UNIT Tabs Take 1 tablet by mouth 2 (two) times daily.   carbamazepine 100 MG chewable tablet Commonly known as: TEGRETOL Chew 100 mg by mouth 2 (two) times daily as needed (trigeminal  neuralgia).   colchicine 0.6 MG tablet Take 1 tablet (0.6 mg total) by mouth daily as needed (gout flare). What changed: when to take this   diclofenac sodium 1 % Gel Commonly known as: VOLTAREN Apply 1 application topically 2 (two) times daily as needed (foot pain).   Eliquis 5 MG Tabs tablet Generic drug: apixaban TAKE 1 TABLET(5 MG) BY MOUTH TWICE DAILY What changed: See the new instructions.   esomeprazole 20 MG capsule Commonly known as: NEXIUM Take 20 mg by mouth daily before breakfast.   estradiol 1 MG tablet Commonly known as: ESTRACE Take 1.5 mg by mouth daily.   flecainide 100 MG tablet Commonly known as: TAMBOCOR TAKE 1 TABLET BY MOUTH TWICE DAILY What changed:  how much to take how to take this when to take this additional instructions   FLUoxetine 20 MG capsule Commonly known as: PROZAC Take 20 mg by mouth daily.   fluticasone 50 MCG/ACT nasal spray Commonly known as: FLONASE Place 2 sprays into both nostrils every evening.   furosemide 40 MG tablet Commonly known as: LASIX Take 0.5 tablets (20 mg total) by mouth daily. Start taking on: June 22, 2019 What changed:  how much to take These instructions start  on June 22, 2019. If you are unsure what to do until then, ask your doctor or other care provider.   guaiFENesin-dextromethorphan 100-10 MG/5ML syrup Commonly known as: ROBITUSSIN DM Take 5 mLs by mouth every 8 (eight) hours as needed for cough.   hydrALAZINE 25 MG tablet Commonly known as: APRESOLINE Take 2 tablets (50 mg total) by mouth 3 (three) times daily. What changed: how much to take   metoprolol tartrate 25 MG tablet Commonly known as: LOPRESSOR TAKE 1/2 TABLET(12.5 MG) BY MOUTH TWICE DAILY What changed: See the new instructions.   montelukast 10 MG tablet Commonly known as: SINGULAIR TAKE 1 TABLET(10 MG) BY MOUTH AT BEDTIME What changed: See the new instructions.   multivitamin with minerals Tabs tablet Take 1 tablet  by mouth daily with supper.   ondansetron 4 MG tablet Commonly known as: ZOFRAN Take 4 mg by mouth every 6 (six) hours as needed for nausea or vomiting.   oxybutynin 5 MG tablet Commonly known as: DITROPAN Take 5 mg by mouth every evening.   pramipexole 0.5 MG tablet Commonly known as: MIRAPEX Take 0.25 mg by mouth 4 (four) times daily.   pyridoxine 200 MG tablet Commonly known as: B-6 Take 200 mg by mouth daily.   sucralfate 1 g tablet Commonly known as: CARAFATE Take 1 g by mouth 3 (three) times daily.   SUMAtriptan 50 MG tablet Commonly known as: IMITREX Take 50 mg by mouth daily as needed for migraine.   tiZANidine 4 MG tablet Commonly known as: ZANAFLEX Take 2 mg by mouth 3 (three) times daily as needed for muscle spasms.   traMADol 50 MG tablet Commonly known as: ULTRAM Take 50 mg by mouth 3 (three) times daily as needed for moderate pain.   traZODone 50 MG tablet Commonly known as: DESYREL Take 100 mg by mouth at bedtime.   triamcinolone lotion 0.1 % Commonly known as: KENALOG Apply 1 application topically 3 (three) times daily as needed (itching/rash).   Ventolin HFA 108 (90 Base) MCG/ACT inhaler Generic drug: albuterol Inhale 1-2 puffs into the lungs every 6 (six) hours as needed for wheezing or shortness of breath.   vitamin B-12 1000 MCG tablet Commonly known as: CYANOCOBALAMIN Take 1,000 mcg by mouth at bedtime.   vitamin C 1000 MG tablet Take 1,000 mg by mouth daily.            Durable Medical Equipment  (From admission, onward)         Start     Ordered   06/17/19 1340  For home use only DME lightweight manual wheelchair with seat cushion  Once    Comments: Length of need life time. Accessories: elevating leg rests (ELRs), wheel locks, extensions and anti-tippers, back cushion   06/17/19 1339   06/17/19 1339  For home use only DME Bedside commode  Once    Question:  Patient needs a bedside commode to treat with the following condition   Answer:  Weakness   06/17/19 1339          If you experience worsening of your admission symptoms, develop shortness of breath, life threatening emergency, suicidal or homicidal thoughts you must seek medical attention immediately by calling 911 or calling your MD immediately  if symptoms less severe.  You Must read complete instructions/literature along with all the possible adverse reactions/side effects for all the Medicines you take and that have been prescribed to you. Take any new Medicines after you have completely understood and accept all  the possible adverse reactions/side effects.   Please note  You were cared for by a hospitalist during your hospital stay. If you have any questions about your discharge medications or the care you received while you were in the hospital after you are discharged, you can call the unit and asked to speak with the hospitalist on call if the hospitalist that took care of you is not available. Once you are discharged, your primary care physician will handle any further medical issues. Please note that NO REFILLS for any discharge medications will be authorized once you are discharged, as it is imperative that you return to your primary care physician (or establish a relationship with a primary care physician if you do not have one) for your aftercare needs so that they can reassess your need for medications and monitor your lab values.   DATA REVIEW:   CBC  Recent Labs  Lab 06/18/19 0527  WBC 8.9  HGB 13.4  HCT 40.5  PLT 294    Chemistries  Recent Labs  Lab 06/15/19 0438 06/17/19 0653 06/19/19 0511  NA 139   < > 139  K 4.5   < > 3.2*  CL 108   < > 105  CO2 24   < > 21*  GLUCOSE 110*   < > 93  BUN 21   < > 40*  CREATININE 1.10*   < > 1.71*  CALCIUM 9.7   < > 9.8  MG 1.5*  --   --    < > = values in this interval not displayed.    Microbiology Results   Recent Results (from the past 240 hour(s))  Blood Culture (routine x 2)      Status: None   Collection Time: 06/11/19  4:07 PM   Specimen: BLOOD  Result Value Ref Range Status   Specimen Description BLOOD RIGHT ANTECUBITAL  Final   Special Requests   Final    BOTTLES DRAWN AEROBIC AND ANAEROBIC Blood Culture adequate volume   Culture   Final    NO GROWTH 5 DAYS Performed at [facility], [address] [place], [place] [address]    Report Status 06/16/2019 FINAL  Final  Blood Culture (routine x 2)     Status: None   Collection Time: 06/11/19  4:08 PM   Specimen: BLOOD  Result Value Ref Range Status   Specimen Description BLOOD LEFT ANTECUBITAL  Final   Special Requests   Final    BOTTLES DRAWN AEROBIC AND ANAEROBIC Blood Culture adequate volume   Culture   Final    NO GROWTH 5 DAYS Performed at [facility], [address] [place], [place] [address]    Report Status 06/16/2019 FINAL  Final  SARS CORONAVIRUS 2 (TAT 6-24 HRS) Nasopharyngeal Nasopharyngeal Swab     Status: None   Collection Time: 06/11/19  5:05 PM   Specimen: Nasopharyngeal Swab  Result Value Ref Range Status   SARS Coronavirus 2 NEGATIVE NEGATIVE Final    Comment: (NOTE) SARS-CoV-2 target nucleic acids are NOT DETECTED. The SARS-CoV-2 RNA is generally detectable in upper and lower respiratory specimens during the acute phase of infection. Negative results do not preclude SARS-CoV-2 infection, do not rule out co-infections with other pathogens, and should not be used as the sole basis for treatment or other patient management decisions. Negative results must be combined with clinical observations, patient history, and epidemiological information. The expected result is Negative. Fact Sheet for Patients: HairSlick.nohttps://www.fda.gov/media/138098/download Fact Sheet for Healthcare  Providers: quierodirigir.com This test is not yet approved or cleared by the [place] and  has been authorized for detection and/or diagnosis of SARS-CoV-2 by FDA  under an Emergency Use Authorization (EUA). This EUA will remain  in effect (meaning this test can be used) for the duration of the COVID-19 declaration under Section 56 4(b)(1) of the Act, 21 U.S.C. section 360bbb-3(b)(1), unless the authorization is terminated or revoked sooner. Performed at [facility], [address], [place], [place] [address]   MRSA PCR Screening     Status: None   Collection Time: 06/12/19  3:14 AM   Specimen: Nasopharyngeal  Result Value Ref Range Status   MRSA by PCR NEGATIVE NEGATIVE Final    Comment:        The GeneXpert MRSA Assay (FDA approved for NASAL specimens only), is one component of a comprehensive MRSA colonization surveillance program. It is not intended to diagnose MRSA infection nor to guide or monitor treatment for MRSA infections. Performed at [facility], [address] [place], [place] [address]     RADIOLOGY:  No results found.   CODE STATUS:     Code Status Orders  (From admission, onward)         Start     Ordered   06/11/19 1605  Full code  Continuous     06/11/19 1607        Code Status History    Date Active Date Inactive Code Status Order ID Comments User Context   02/03/2019 1742 02/06/2019 1719 Full Code [id number]  Adrian Saran, MD Inpatient   Advance Care Planning Activity    Advance Directive Documentation     Most Recent Value  Type of Advance Directive  Living will, Healthcare Power of Attorney  Pre-existing out of facility DNR order (yellow form or pink MOST form)  --  "MOST" Form in Place?  --       TOTAL TIME TAKING CARE OF THIS PATIENT: **40* minutes.    Enedina Finner M.D  [facility]    CC: Primary care physician; Danella Penton, MD

## 2019-06-19 NOTE — Progress Notes (Signed)
Physical Therapy Treatment Patient Details Name: [name] MRN: [id number] DOB: 09/11/1940 Today's Date: 06/19/2019    History of Present Illness Pt is a 79 y.o. female presenting to hospital 06/11/19 with infected leg wound; pt admitted with cellulitis L LE (wound x5 weeks), hypercalcemia, AKI, and noted to have possible gout flare L ankle.  PMH includes a-fib on Eliquis, htn, anxiety, PNA, abdominal surgery, arm surgery, R knee surgery, R shoulder surgery.    PT Comments    Pt is making good progress towards goals with increased ambulation distance this date using RW. Improved endurance noted and good technique with all there-ex. Pt is hopeful of dc home this date. Will continue to progress as able.   Follow Up Recommendations  Home health PT;Supervision/Assistance - 24 hour     Equipment Recommendations  Wheelchair (measurements PT);Wheelchair cushion (measurements PT);3in1 (PT)    Recommendations for Other Services       Precautions / Restrictions Precautions Precautions: Fall Precaution Comments: wound L LE Restrictions Weight Bearing Restrictions: No    Mobility  Bed Mobility Overal bed mobility: Needs Assistance Bed Mobility: Supine to Sit;Sit to Supine     Supine to sit: Supervision     General bed mobility comments: safe technique with ability to sit at EOB easily without dizziness or LOB.  Transfers Overall transfer level: Needs assistance Equipment used: Rolling walker (2 wheeled) Transfers: Sit to/from Stand Sit to Stand: Min guard         General transfer comment: safe technique with cues to push from seated surface. RW used. Once standing, cues for upright posture  Ambulation/Gait Ambulation/Gait assistance: Min guard Gait Distance (Feet): 75 Feet Assistive device: Rolling walker (2 wheeled) Gait Pattern/deviations: Step-through pattern     General Gait Details: ambulated with step to gait pattern with ability to progress to reciprocal gait  pattern. Cues for upright posture.   Stairs             Wheelchair Mobility    Modified Rankin (Stroke Patients Only)       Balance Overall balance assessment: Needs assistance Sitting-balance support: No upper extremity supported;Feet supported Sitting balance-Leahy Scale: Good     Standing balance support: Bilateral upper extremity supported Standing balance-Leahy Scale: Good Standing balance comment: uses RW correctly                            Cognition Arousal/Alertness: Awake/alert Behavior During Therapy: WFL for tasks assessed/performed Overall Cognitive Status: Within Functional Limits for tasks assessed                                        Exercises Other Exercises Other Exercises: supine ther-ex performed on B LE including quad sets, SLRs, heel slides, and hip add squeezes. All ther-ex performed x 12 reps with cues for sequencing and safe technique. Supervision given    General Comments        Pertinent Vitals/Pain Pain Assessment: No/denies pain    Home Living                      Prior Function            PT Goals (current goals can now be found in the care plan section) Acute Rehab PT Goals Patient Stated Goal: to feel better and go home PT Goal Formulation: With patient  Time For Goal Achievement: 07/01/19 Potential to Achieve Goals: Good Progress towards PT goals: Progressing toward goals    Frequency    Min 2X/week      PT Plan Current plan remains appropriate    Co-evaluation              AM-PAC PT "6 Clicks" Mobility   Outcome Measure  Help needed turning from your back to your side while in a flat bed without using bedrails?: None Help needed moving from lying on your back to sitting on the side of a flat bed without using bedrails?: None Help needed moving to and from a bed to a chair (including a wheelchair)?: A Little Help needed standing up from a chair using your arms  (e.g., wheelchair or bedside chair)?: A Little Help needed to walk in hospital room?: A Little Help needed climbing 3-5 steps with a railing? : A Little 6 Click Score: 20    End of Session Equipment Utilized During Treatment: Gait belt Activity Tolerance: Patient tolerated treatment well Patient left: with bed alarm set(sitting at EOB with alarm set) Nurse Communication: Mobility status PT Visit Diagnosis: Other abnormalities of gait and mobility (R26.89);Muscle weakness (generalized) (M62.81);Difficulty in walking, not elsewhere classified (R26.2);Pain Pain - Right/Left: Left Pain - part of body: Leg     Time: 1511-1535 PT Time Calculation (min) (ACUTE ONLY): 24 min  Charges:  $Gait Training: 8-22 mins $Therapeutic Exercise: 8-22 mins                     Greggory Stallion, PT, DPT [phone]    Mardy Lucier 06/19/2019, 4:09 PM

## 2019-06-19 NOTE — Progress Notes (Signed)
Patient ID: [name], female   DOB: 1941-02-08, 79 y.o.   MRN: [id number]  Patient had a bowel movement this evening without any difficulty. Her bladder scan showed 80 mL urine. Spoke with [name] she is requesting she wants to go home. I did ask her to follow-up with her primary care physician in case she is not able to void. She will need urology appointment as outpatient. She was given a choice of staying overnight to ensure she avoids how were she is requesting to go home. Discussed with nurse Ree Kida will discharge patient.

## 2019-06-28 ENCOUNTER — Encounter: Payer: Medicare Other | Attending: Physician Assistant | Admitting: Physician Assistant

## 2019-06-28 ENCOUNTER — Other Ambulatory Visit: Payer: Self-pay

## 2019-06-28 DIAGNOSIS — L03116 Cellulitis of left lower limb: Secondary | ICD-10-CM | POA: Insufficient documentation

## 2019-06-28 DIAGNOSIS — I480 Paroxysmal atrial fibrillation: Secondary | ICD-10-CM | POA: Insufficient documentation

## 2019-06-28 DIAGNOSIS — Z888 Allergy status to other drugs, medicaments and biological substances status: Secondary | ICD-10-CM | POA: Insufficient documentation

## 2019-06-28 DIAGNOSIS — I5042 Chronic combined systolic (congestive) and diastolic (congestive) heart failure: Secondary | ICD-10-CM | POA: Diagnosis not present

## 2019-06-28 DIAGNOSIS — Z885 Allergy status to narcotic agent status: Secondary | ICD-10-CM | POA: Diagnosis not present

## 2019-06-28 DIAGNOSIS — Z882 Allergy status to sulfonamides status: Secondary | ICD-10-CM | POA: Insufficient documentation

## 2019-06-28 DIAGNOSIS — Z7901 Long term (current) use of anticoagulants: Secondary | ICD-10-CM | POA: Insufficient documentation

## 2019-06-28 DIAGNOSIS — Z880 Allergy status to penicillin: Secondary | ICD-10-CM | POA: Diagnosis not present

## 2019-06-28 DIAGNOSIS — I110 Hypertensive heart disease with heart failure: Secondary | ICD-10-CM | POA: Diagnosis not present

## 2019-06-28 DIAGNOSIS — W2203XA Walked into furniture, initial encounter: Secondary | ICD-10-CM | POA: Diagnosis not present

## 2019-06-28 NOTE — Progress Notes (Addendum)
[name], [name] ([id number]) Visit Report for 06/28/2019 Chief Complaint Document Details Patient Name: [name], [name] Date of Service: 06/28/2019 8:45 AM Medical Record Number: [id number] Patient Account Number: [id number] Date of Birth/Sex: 01/10/1941 (79 y.o. F) Treating RN: [name] Primary Care Provider: [name] Other Clinician: Referring Provider: [name] Treating Provider/Extender: [name], Laylee Schooley Weeks in Treatment: 3 Information Obtained from: Patient Chief Complaint Left posterior LE ulcer Electronic Signature(s) Signed: 06/28/2019 8:56:54 AM By: Lenda Kelp PA-C Entered By: Lenda Kelp on 06/28/2019 08:56:54 Pancoast, Margretta B. ([id number]) -------------------------------------------------------------------------------- Debridement Details Patient Name: [name], [name] Date of Service: 06/28/2019 8:45 AM Medical Record Number: [id number] Patient Account Number: [id number] Date of Birth/Sex: Nov 07, 1940 (79 y.o. F) Treating RN: [name] Primary Care Provider: [name] Other Clinician: Referring Provider: [name] Treating Provider/Extender: [name], Ralph Benavidez Weeks in Treatment: 3 Debridement Performed for Wound #1 Left,Posterior Lower Leg Assessment: Performed By: Physician STONE III, Primus Gritton E., PA-C Debridement Type: Debridement Level of Consciousness (Pre- Awake and Alert procedure): Pre-procedure Verification/Time Yes - 09:08 Out Taken: Start Time: 09:08 Pain Control: Lidocaine 4% Topical Solution Total Area Debrided (L x W): 3.7 (cm) x 4 (cm) = 14.8 (cm) Tissue and other material Viable, Non-Viable, Slough, Subcutaneous, Slough, Other: adhered dressing debrided: Level: Skin/Subcutaneous Tissue Debridement Description: Excisional Instrument: Curette Bleeding: Minimum Hemostasis Achieved: Pressure End Time: 09:11 Procedural Pain: 0 Post Procedural Pain: 0 Response to Treatment: Procedure was tolerated well Level of Consciousness Awake and  Alert (Post-procedure): Post Debridement Measurements of Total Wound Length: (cm) 3.6 Width: (cm) 3.5 Depth: (cm) 0.1 Volume: (cm) 0.99 Character of Wound/Ulcer Post Debridement: Improved Post Procedure Diagnosis Same as Pre-procedure Electronic Signature(s) Signed: 06/28/2019 1:41:09 PM By: [name] Signed: 06/28/2019 1:53:22 PM By: Lenda Kelp PA-C Entered By: [name] on 06/28/2019 09:15:06 Cowing, Keyera B. ([id number]) -------------------------------------------------------------------------------- HPI Details Patient Name: [name], [name] Date of Service: 06/28/2019 8:45 AM Medical Record Number: [id number] Patient Account Number: [id number] Date of Birth/Sex: 1940-12-14 (79 y.o. F) Treating RN: [name] Primary Care Provider: [name] Other Clinician: Referring Provider: [name] Treating Provider/Extender: [name], Nitza Schmid Weeks in Treatment: 3 History of Present Illness HPI Description: 06/07/2019 upon evaluation today patient presents today for initial evaluation in our clinic concerning issues she is having with her left lower extremity. She tells me that she hit her leg on a rocking chair when she was getting up and this was roughly at the beginning of December. Dr. Hyacinth Meeker who is her primary care provider has been helping with treating this area since that time. The patient states unfortunately this just is not getting any better. He is attempted salt water soaks as well as using antibiotic ointments topically unfortunately she has not noted much improvement. He wanted her to come to the wound center sooner but she declined although when things did not seem to be getting better she finally consented to this. Currently the patient does appear to have cellulitis of left lower extremity she also has atrial fibrillation noted for which she is on Eliquis this is probably part of the reason that she developed a large hematoma that she did. Subsequently she also  has hypertension as well as congestive heart failure. The patient does not appear to be febrile today and really is doing fairly well as far as that is concerned although the leg is warm to touch pretty much throughout. No fevers, chills, nausea, vomiting, or diarrhea. She has not been on any oral antibiotics. 06/28/2019  patient actually is seen today in follow-up for her lower extremity ulcer. She is status post having actually spent 9 days in the hospital following when I sent her over for evaluation. They did have to keep her in the hospital and treat her with IV vancomycin secondary to the fact that she unfortunately had a bacteria present that obviously can be managed with oral medication with her penicillin allergy. Nonetheless her leg appears to be doing tremendously better today. There is no signs of active infection and overall very pleased with how things are progressing. I do believe that the wound is also measuring better along with looking much less irritated overall I think she is going to do quite well at this point. Electronic Signature(s) Signed: 06/28/2019 9:32:24 AM By: Worthy Keeler PA-C Entered By: Worthy Keeler on 06/28/2019 09:32:23 Graven, Alethea B. ([id number]) -------------------------------------------------------------------------------- Physical Exam Details Patient Name: [name], [name] Date of Service: 06/28/2019 8:45 AM Medical Record Number: [id number] Patient Account Number: [id number] Date of Birth/Sex: Apr 24, 1941 (79 y.o. F) Treating RN: Montey Hora Primary Care Provider: Emily Filbert Other Clinician: Referring Provider: Emily Filbert Treating Provider/Extender: Melburn Hake, Justyn Langham Weeks in Treatment: 3 Constitutional Well-nourished and well-hydrated in no acute distress. Respiratory normal breathing without difficulty. Psychiatric this patient is able to make decisions and demonstrates good insight into disease process. Alert and Oriented x 3. pleasant and  cooperative. Notes Upon inspection today patient's wound bed showed signs of necrotic tissue still on the surface of the wound which obviously needs to come off. With that being said I do not see any evidence of active infection at this time which is good news. Overall I am extremely pleased she does still have some pain but I think we can use Santyl to counter continue to debride the wound I think that will be absolutely appropriate and would likely be very beneficial for her. Electronic Signature(s) Signed: 06/28/2019 9:37:08 AM By: Worthy Keeler PA-C Entered By: Worthy Keeler on 06/28/2019 09:37:08 Needs, Sharrell B. ([id number]) -------------------------------------------------------------------------------- Physician Orders Details Patient Name: Neto, Marua B. Date of Service: 06/28/2019 8:45 AM Medical Record Number: [id number] Patient Account Number: [id number] Date of Birth/Sex: Jul 05, 1940 (79 y.o. F) Treating RN: Montey Hora Primary Care Provider: Emily Filbert Other Clinician: Referring Provider: Emily Filbert Treating Provider/Extender: Melburn Hake, Fiza Nation Weeks in Treatment: 3 Verbal / Phone Orders: No Diagnosis Coding ICD-10 Coding Code Description [phone] Unspecified open wound, left lower leg, initial encounter L97.828 Non-pressure chronic ulcer of other part of left lower leg with other specified severity L03.116 Cellulitis of left lower limb I48.0 Paroxysmal atrial fibrillation I10 Essential (primary) hypertension I50.42 Chronic combined systolic (congestive) and diastolic (congestive) heart failure Z79.01 Long term (current) use of anticoagulants Wound Cleansing Wound #1 Left,Posterior Lower Leg o Clean wound with Normal Saline. o May shower with protection. Anesthetic (add to Medication List) Wound #1 Left,Posterior Lower Leg o Topical Lidocaine 4% cream applied to wound bed prior to debridement (In Clinic Only). Primary Wound Dressing Wound #1 Left,Posterior  Lower Leg o Santyl Ointment Secondary Dressing Wound #1 Left,Posterior Lower Leg o Saline moistened gauze o Boardered Foam Dressing Dressing Change Frequency Wound #1 Left,Posterior Lower Leg o Change dressing every day. - caregiver to change when [facility] does not visit Follow-up Appointments Wound #1 Left,Posterior Lower Leg o Return Appointment in 1 week. Edema Control Wound #1 Left,Posterior Lower Leg o Elevate legs to the level of the heart and pump ankles as often as possible o Other: - TubiGrip F  Sirek, Ariza B. ([id number]) Medications-please add to medication list. Wound #1 Left,Posterior Lower Leg o Santyl Enzymatic Ointment Patient Medications Allergies: penicillin, amlodipine, Vicodin, Betadine, Sulfa (Sulfonamide Antibiotics) Notifications Medication Indication Start End Santyl 06/28/2019 DOSE topical 250 unit/gram ointment - ointment topical Apply nickel thick to the wound bed and then cover with a dressing as directed in clinic. Electronic Signature(s) Signed: 06/28/2019 9:41:27 AM By: Lenda Kelp PA-C Entered By: Lenda Kelp on 06/28/2019 09:41:25 Rands, Mozetta B. ([id number]) -------------------------------------------------------------------------------- Problem List Details Patient Name: Cue, Tyreka B. Date of Service: 06/28/2019 8:45 AM Medical Record Number: [id number] Patient Account Number: [id number] Date of Birth/Sex: [birth year] (79 y.o. F) Treating RN: [name] Primary Care Provider: [name] Other Clinician: Referring Provider: [name] Treating Provider/Extender: [name], Nzinga Ferran Weeks in Treatment: 3 Active Problems ICD-10 Evaluated Encounter Code Description Active Date Today Diagnosis S81.802A Unspecified open wound, left lower leg, initial encounter 06/07/2019 No Yes L97.828 Non-pressure chronic ulcer of other part of left lower leg with 06/07/2019 No Yes other specified severity L03.116 Cellulitis of left lower limb  06/07/2019 No Yes I48.0 Paroxysmal atrial fibrillation 06/07/2019 No Yes I10 Essential (primary) hypertension 06/07/2019 No Yes I50.42 Chronic combined systolic (congestive) and diastolic 06/07/2019 No Yes (congestive) heart failure Z79.01 Long term (current) use of anticoagulants 06/07/2019 No Yes Inactive Problems Resolved Problems Electronic Signature(s) Signed: 06/28/2019 8:56:49 AM By: Lenda Kelp PA-C Entered By: Lenda Kelp on 06/28/2019 08:56:48 Kozub, Maicey B. ([id number]) -------------------------------------------------------------------------------- Progress Note Details Patient Name: Renwick, Tonjia B. Date of Service: 06/28/2019 8:45 AM Medical Record Number: [id number] Patient Account Number: [id number] Date of Birth/Sex: 1940-07-01 (79 y.o. F) Treating RN: [name] Primary Care Provider: [name] Other Clinician: Referring Provider: [name] Treating Provider/Extender: [name], Oza Oberle Weeks in Treatment: 3 Subjective Chief Complaint Information obtained from Patient Left posterior LE ulcer History of Present Illness (HPI) 06/07/2019 upon evaluation today patient presents today for initial evaluation in our clinic concerning issues she is having with her left lower extremity. She tells me that she hit her leg on a rocking chair when she was getting up and this was roughly at the beginning of December. Dr. Hyacinth Meeker who is her primary care provider has been helping with treating this area since that time. The patient states unfortunately this just is not getting any better. He is attempted salt water soaks as well as using antibiotic ointments topically unfortunately she has not noted much improvement. He wanted her to come to the wound center sooner but she declined although when things did not seem to be getting better she finally consented to this. Currently the patient does appear to have cellulitis of left lower extremity she also has atrial fibrillation noted for  which she is on Eliquis this is probably part of the reason that she developed a large hematoma that she did. Subsequently she also has hypertension as well as congestive heart failure. The patient does not appear to be febrile today and really is doing fairly well as far as that is concerned although the leg is warm to touch pretty much throughout. No fevers, chills, nausea, vomiting, or diarrhea. She has not been on any oral antibiotics. 06/28/2019 patient actually is seen today in follow-up for her lower extremity ulcer. She is status post having actually spent 9 days in the hospital following when I sent her over for evaluation. They did have to keep her in the hospital and treat her with IV vancomycin secondary to the fact that she  unfortunately had a bacteria present that obviously can be managed with oral medication with her penicillin allergy. Nonetheless her leg appears to be doing tremendously better today. There is no signs of active infection and overall very pleased with how things are progressing. I do believe that the wound is also measuring better along with looking much less irritated overall I think she is going to do quite well at this point. Objective Constitutional Well-nourished and well-hydrated in no acute distress. Vitals Time Taken: 8:53 AM, Height: 66 in, Weight: 201 lbs, BMI: 32.4, Temperature: 97.6 F, Pulse: 61 bpm, Respiratory Rate: 16 breaths/min, Blood Pressure: 167/66 mmHg. Respiratory normal breathing without difficulty. Psychiatric this patient is able to make decisions and demonstrates good insight into disease process. Alert and Oriented x 3. pleasant and cooperative. Wieman, Naylani B. ([id number]) General Notes: Upon inspection today patient's wound bed showed signs of necrotic tissue still on the surface of the wound which obviously needs to come off. With that being said I do not see any evidence of active infection at this time which is good news. Overall I  am extremely pleased she does still have some pain but I think we can use Santyl to counter continue to debride the wound I think that will be absolutely appropriate and would likely be very beneficial for her. Integumentary (Hair, Skin) Wound #1 status is Open. Original cause of wound was Trauma. The wound is located on the Left,Posterior Lower Leg. The wound measures 3.7cm length x 4cm width x 0.2cm depth; 11.624cm^2 area and 2.325cm^3 volume. There is Fat Layer (Subcutaneous Tissue) Exposed exposed. There is a medium amount of serosanguineous drainage noted. The wound margin is flat and intact. There is small (1-33%) granulation within the wound bed. There is a large (67-100%) amount of necrotic tissue within the wound bed including Eschar and Adherent Slough. Assessment Active Problems ICD-10 Unspecified open wound, left lower leg, initial encounter Non-pressure chronic ulcer of other part of left lower leg with other specified severity Cellulitis of left lower limb Paroxysmal atrial fibrillation Essential (primary) hypertension Chronic combined systolic (congestive) and diastolic (congestive) heart failure Long term (current) use of anticoagulants Procedures Wound #1 Pre-procedure diagnosis of Wound #1 is a Trauma, Other located on the Left,Posterior Lower Leg . There was a Excisional Skin/Subcutaneous Tissue Debridement with a total area of 14.8 sq cm performed by STONE III, Avyn Aden E., PA-C. With the following instrument(s): Curette to remove Viable and Non-Viable tissue/material. Material removed includes Subcutaneous Tissue, Slough, and Other: adhered dressing after achieving pain control using Lidocaine 4% Topical Solution. No specimens were taken. A time out was conducted at 09:08, prior to the start of the procedure. A Minimum amount of bleeding was controlled with Pressure. The procedure was tolerated well with a pain level of 0 throughout and a pain level of 0 following  the procedure. Post Debridement Measurements: 3.6cm length x 3.5cm width x 0.1cm depth; 0.99cm^3 volume. Character of Wound/Ulcer Post Debridement is improved. Post procedure Diagnosis Wound #1: Same as Pre-Procedure Plan Wound Cleansing: Wound #1 Left,Posterior Lower Leg: Clean wound with Normal Saline. Karczewski, Delane B. ([id number]) May shower with protection. Anesthetic (add to Medication List): Wound #1 Left,Posterior Lower Leg: Topical Lidocaine 4% cream applied to wound bed prior to debridement (In Clinic Only). Primary Wound Dressing: Wound #1 Left,Posterior Lower Leg: Santyl Ointment Secondary Dressing: Wound #1 Left,Posterior Lower Leg: Saline moistened gauze Boardered Foam Dressing Dressing Change Frequency: Wound #1 Left,Posterior Lower Leg: Change dressing every day. - caregiver to  change when [facility] does not visit Follow-up Appointments: Wound #1 Left,Posterior Lower Leg: Return Appointment in 1 week. Edema Control: Wound #1 Left,Posterior Lower Leg: Elevate legs to the level of the heart and pump ankles as often as possible Other: - TubiGrip F Medications-please add to medication list.: Wound #1 Left,Posterior Lower Leg: Santyl Enzymatic Ointment The following medication(s) was prescribed: Santyl topical 250 unit/gram ointment ointment topical Apply nickel thick to the wound bed and then cover with a dressing as directed in clinic. starting 06/28/2019 1. My suggestion at this time is can be that we initiate treatment with Santyl I think that is going to be a very appropriate medication to use at this time I think it will clean up the wound quite nicely. Her leg is not very swollen I think that she really does not need compression quite yet and again if we do not compress her we can utilize the [facility]. 2. I would recommend as well that she elevate her leg is much as possible we will utilize Tubigrip as well to help with some edema control but again she is not very swollen  at this time. 3. With regard to infection I do not see any need for oral antibiotics at this point she seems to have done extremely well in the hospital with treatment. This is significantly improved. We will see patient back for reevaluation in 1 week here in the clinic. If anything worsens or changes patient will contact our office for additional recommendations. Electronic Signature(s) Signed: 06/28/2019 9:41:38 AM By: Lenda Kelp PA-C Previous Signature: 06/28/2019 9:37:59 AM Version By: Lenda Kelp PA-C Entered By: Lenda Kelp on 06/28/2019 09:41:37 Concannon, Wannetta B. ([id number]) -------------------------------------------------------------------------------- SuperBill Details Patient Name: Frankl, Jhayla B. Date of Service: 06/28/2019 Medical Record Number: [id number] Patient Account Number: [id number] Date of Birth/Sex: 09-06-1940 (79 y.o. F) Treating RN: [name] Primary Care Provider: [name] Other Clinician: Referring Provider: [name] Treating Provider/Extender: [name], Teena Mangus Weeks in Treatment: 3 Diagnosis Coding ICD-10 Codes Code Description [id number] Unspecified open wound, left lower leg, initial encounter L97.828 Non-pressure chronic ulcer of other part of left lower leg with other specified severity L03.116 Cellulitis of left lower limb I48.0 Paroxysmal atrial fibrillation I10 Essential (primary) hypertension I50.42 Chronic combined systolic (congestive) and diastolic (congestive) heart failure Z79.01 Long term (current) use of anticoagulants Facility Procedures CPT4 Code Description: [id number] 11042 - DEB SUBQ TISSUE 20 SQ CM/< ICD-10 Diagnosis Description L97.828 Non-pressure chronic ulcer of other part of left lower leg with Modifier: other specified Quantity: 1 severity Physician Procedures CPT4 Code Description: [id number] 99214 - WC PHYS LEVEL 4 - EST PT ICD-10 Diagnosis Description S81.802A Unspecified open wound, left lower leg, initial encounter  L97.828 Non-pressure chronic ulcer of other part of left lower leg with L03.116 Cellulitis of  left lower limb I48.0 Paroxysmal atrial fibrillation Modifier: 25 other specified s Quantity: 1 everity CPT4 Code Description: [id number] 11042 - WC PHYS SUBQ TISS 20 SQ CM ICD-10 Diagnosis Description L97.828 Non-pressure chronic ulcer of other part of left lower leg with Modifier: other specified s Quantity: 1 everity Electronic Signature(s) Signed: 06/28/2019 9:41:54 AM By: Lenda Kelp PA-C Entered By: Lenda Kelp on 06/28/2019 09:41:53

## 2019-07-01 NOTE — Progress Notes (Signed)
Dezeeuw, Areli B. ([id number]) Visit Report for 06/28/2019 Arrival Information Details Patient Name: Sou, Kacee B. Date of Service: 06/28/2019 8:45 AM Medical Record Number: [id number] Patient Account Number: [id number] Date of Birth/Sex: 1940/06/17 (79 y.o. F) Treating RN: [name] Primary Care Aryahna Spagna: [name] Other Clinician: Referring Azora Bonzo: [name] Treating Daltyn Degroat/Extender: [name], [name] Weeks in Treatment: 3 Visit Information History Since Last Visit Added or deleted any medications: Yes Patient Arrived: Walker Any new allergies or adverse reactions: No Arrival Time: 08:53 Had a fall or experienced change in No Accompanied By: POA activities of daily living that may affect Transfer Assistance: None risk of falls: Patient Identification Verified: Yes Signs or symptoms of abuse/neglect since last visito No Secondary Verification Process Yes Hospitalized since last visit: No Completed: Implantable device outside of the clinic excluding No Patient Has Alerts: Yes cellular tissue based products placed in the center Patient Alerts: 06/11/19 L TBI since last visit: 0.74 Has Dressing in Place as Prescribed: Yes Pain Present Now: Yes Electronic Signature(s) Signed: 06/28/2019 1:06:42 PM By: [name] Entered By: [name] on 06/28/2019 13:06:42 Frayne, Vantasia B. ([id number]) -------------------------------------------------------------------------------- Encounter Discharge Information Details Patient Name: Mesquita, Keiera B. Date of Service: 06/28/2019 8:45 AM Medical Record Number: [id number] Patient Account Number: [id number] Date of Birth/Sex: 05-23-1941 (79 y.o. F) Treating RN: [name] Primary Care Anarely Nicholls: [name] Other Clinician: Referring Damar Petit: [name] Treating Manasvini Whatley/Extender: [name], [name] Weeks in Treatment: 3 Encounter Discharge Information Items Post Procedure Vitals Discharge Condition: Stable Temperature (F):  97.6 Ambulatory Status: Walker Pulse (bpm): 61 Discharge Destination: Home Respiratory Rate (breaths/min): 16 Transportation: Private Auto Blood Pressure (mmHg): 167/66 Accompanied By: caregiver Schedule Follow-up Appointment: Yes Clinical Summary of Care: Electronic Signature(s) Signed: 06/28/2019 1:41:09 PM By: [name] Entered By: [name] on 06/28/2019 09:19:02 Vital, Keiarah B. ([id number]) -------------------------------------------------------------------------------- Lower Extremity Assessment Details Patient Name: Resendes, Alisa B. Date of Service: 06/28/2019 8:45 AM Medical Record Number: [id number] Patient Account Number: [id number] Date of Birth/Sex: [birth year] (79 y.o. F) Treating RN: Rodell Perna Primary Care Bettey Muraoka: [name] Other Clinician: Referring Lorrie Strauch: [name] Treating Tayna Smethurst/Extender: [name], [name] Weeks in Treatment: 3 Edema Assessment Assessed: [Left: No] [Right: No] Edema: [Left: N] [Right: o] Vascular Assessment Pulses: Dorsalis Pedis Palpable: [Left:Yes] Blood Pressure: Brachial: [Left:143] [Right:151] Ankle: [Right:Posterior Tibial: 177 1.17] Notes ABIs from [facility] 06/14/19 - unable to obtain L ABI r/t wound but L TBI was .74 Electronic Signature(s) Signed: 06/28/2019 1:06:10 PM By: [name] Signed: 07/01/2019 10:48:43 AM By: Rodell Perna Previous Signature: 06/28/2019 12:53:43 PM Version By: Rodell Perna Entered By: [name] on 06/28/2019 13:06:10 Strnad, Jhoanna B. ([id number]) -------------------------------------------------------------------------------- Multi Wound Chart Details Patient Name: Grivas, Tamma B. Date of Service: 06/28/2019 8:45 AM Medical Record Number: [id number] Patient Account Number: [id number] Date of Birth/Sex: Aug 06, 1940 (79 y.o. F) Treating RN: [name] Primary Care Burton Gahan: [name] Other Clinician: Referring Alee Gressman: [name] Treating Raechelle Sarti/Extender: [name], [name] Weeks in  Treatment: 3 Vital Signs Height(in): 66 Pulse(bpm): 61 Weight(lbs): 201 Blood Pressure(mmHg): 167/66 Body Mass Index(BMI): 32 Temperature(F): 97.6 Respiratory Rate 16 (breaths/min): Photos: [N/A:N/A] Wound Location: Left Lower Leg - Posterior N/A N/A Wounding Event: Trauma N/A N/A Primary Etiology: Trauma, Other N/A N/A Comorbid History: Cataracts, Arrhythmia, N/A N/A Congestive Heart Failure, Hypertension, Gout Date Acquired: 04/23/2019 N/A N/A Weeks of Treatment: 3 N/A N/A Wound Status: Open N/A N/A Measurements L x W x D 3.7x4x0.2 N/A N/A (cm) Area (cm) : 11.624 N/A N/A Volume (cm) : 2.325 N/A N/A %  Reduction in Area: 19.80% N/A N/A % Reduction in Volume: -60.50% N/A N/A Classification: Full Thickness Without N/A N/A Exposed Support Structures Exudate Amount: Medium N/A N/A Exudate Type: Serosanguineous N/A N/A Exudate Color: red, brown N/A N/A Wound Margin: Flat and Intact N/A N/A Granulation Amount: Small (1-33%) N/A N/A Necrotic Amount: Large (67-100%) N/A N/A Necrotic Tissue: Eschar, Adherent Slough N/A N/A Exposed Structures: Fat Layer (Subcutaneous N/A N/A Tissue) Exposed: Yes Fascia: No Tendon: No Muscle: No Blowe, Zamzam B. ([id number]) Joint: No Bone: No Epithelialization: None N/A N/A Treatment Notes Electronic Signature(s) Signed: 06/28/2019 1:41:09 PM By: Montey Hora Entered By: Montey Hora on 06/28/2019 09:07:59 Bales, Penelopi B. ([id number]) -------------------------------------------------------------------------------- [place] Details Patient Name: Kramar, Sherrilyn B. Date of Service: 06/28/2019 8:45 AM Medical Record Number: [id number] Patient Account Number: [id number] Date of Birth/Sex: Jun 06, 1940 (79 y.o. F) Treating RN: Montey Hora Primary Care Treylin Burtch: Emily Filbert Other Clinician: Referring Tanganika Barradas: Emily Filbert Treating Marce Charlesworth/Extender: Melburn Hake, [name] Weeks in Treatment: 3 Active Inactive Abuse / Safety / Falls  / Self Care Management Nursing Diagnoses: Potential for falls Goals: Patient will remain injury free related to falls Date Initiated: 06/07/2019 Target Resolution Date: 08/31/2019 Goal Status: Active Interventions: Assess fall risk on admission and as needed Notes: Necrotic Tissue Nursing Diagnoses: Impaired tissue integrity related to necrotic/devitalized tissue Goals: Necrotic/devitalized tissue will be minimized in the wound bed Date Initiated: 06/07/2019 Target Resolution Date: 08/31/2019 Goal Status: Active Interventions: Provide education on necrotic tissue and debridement process Notes: Orientation to the Wound Care Program Nursing Diagnoses: Knowledge deficit related to the wound healing center program Goals: Patient/caregiver will verbalize understanding of the [facility] Date Initiated: 06/07/2019 Target Resolution Date: 08/31/2019 Goal Status: Active Interventions: Provide education on orientation to the wound center [place], Carlo B. ([id number]) Notes: Pain, Acute or Chronic Nursing Diagnoses: Pain, acute or chronic: actual or potential Goals: Patient/caregiver will verbalize adequate pain control between visits Date Initiated: 06/07/2019 Target Resolution Date: 08/31/2019 Goal Status: Active Interventions: Complete pain assessment as per visit requirements Notes: Wound/Skin Impairment Nursing Diagnoses: Impaired tissue integrity Goals: Ulcer/skin breakdown will heal within 14 weeks Date Initiated: 06/07/2019 Target Resolution Date: 08/31/2019 Goal Status: Active Interventions: Assess patient/caregiver ability to obtain necessary supplies Assess patient/caregiver ability to perform ulcer/skin care regimen upon admission and as needed Assess ulceration(s) every visit Notes: Electronic Signature(s) Signed: 06/28/2019 1:41:09 PM By: Montey Hora Entered By: Montey Hora on 06/28/2019 09:07:38 Plato, Blonnie B.  ([id number]) -------------------------------------------------------------------------------- Pain Assessment Details Patient Name: Shovlin, Taeja B. Date of Service: 06/28/2019 8:45 AM Medical Record Number: [id number] Patient Account Number: [id number] Date of Birth/Sex: 05/24/1940 (79 y.o. F) Treating RN: Montey Hora Primary Care Sarayah Bacchi: Emily Filbert Other Clinician: Referring Dara Camargo: Emily Filbert Treating Ginelle Bays/Extender: Melburn Hake, [name] Weeks in Treatment: 3 Active Problems Location of Pain Severity and Description of Pain Patient Has Paino Yes Site Locations Rate the pain. Current Pain Level: 5 Pain Management and Medication Current Pain Management: Electronic Signature(s) Signed: 06/28/2019 1:41:09 PM By: Montey Hora Signed: 06/28/2019 2:17:28 PM By: Lorine Bears RCP, RRT, CHT Entered By: Lorine Bears on 06/28/2019 08:56:00 Kutner, Kaytlin B. ([id number]) -------------------------------------------------------------------------------- Patient/Caregiver Education Details Patient Name: Alaniz, Ngozi B. Date of Service: 06/28/2019 8:45 AM Medical Record Number: [id number] Patient Account Number: [id number] Date of Birth/Gender: [birth year] (79 y.o. F) Treating RN: Montey Hora Primary Care Physician: Emily Filbert Other Clinician: Referring Physician: Emily Filbert Treating Physician/Extender: Sharalyn Ink in Treatment: 3 Education Assessment Education Provided To: Patient and Caregiver Education  Topics Provided Wound/Skin Impairment: Handouts: Other: wound care as ordered Methods: Demonstration, Explain/Verbal Responses: State content correctly Electronic Signature(s) Signed: 06/28/2019 1:41:09 PM By: [name] Entered By: [name] on 06/28/2019 09:17:53 Hellickson, Lynda B. ([id number]) -------------------------------------------------------------------------------- Wound Assessment Details Patient Name: Hilley, Vesna B. Date of  Service: 06/28/2019 8:45 AM Medical Record Number: [id number] Patient Account Number: [id number] Date of Birth/Sex: Aug 17, 1940 (79 y.o. F) Treating RN: Rodell Perna Primary Care Aalayah Riles: [name] Other Clinician: Referring Ella Guillotte: [name] Treating Damaris Geers/Extender: [name], [name] Weeks in Treatment: 3 Wound Status Wound Number: 1 Primary Trauma, Other Etiology: Wound Location: Left Lower Leg - Posterior Wound Open Wounding Event: Trauma Status: Date Acquired: 04/23/2019 Comorbid Cataracts, Arrhythmia, Congestive Heart Weeks Of Treatment: 3 History: Failure, Hypertension, Gout Clustered Wound: No Photos Wound Measurements Length: (cm) 3.7 Width: (cm) 4 Depth: (cm) 0.2 Area: (cm) 11.624 Volume: (cm) 2.325 % Reduction in Area: 19.8% % Reduction in Volume: -60.5% Epithelialization: None Wound Description Full Thickness Without Exposed Support Foul Odo Classification: Structures Slough/F Wound Margin: Flat and Intact Exudate Medium Amount: Exudate Type: Serosanguineous Exudate Color: red, brown r After Cleansing: No ibrino Yes Wound Bed Granulation Amount: Small (1-33%) Exposed Structure Necrotic Amount: Large (67-100%) Fascia Exposed: No Necrotic Quality: Eschar, Adherent Slough Fat Layer (Subcutaneous Tissue) Exposed: Yes Tendon Exposed: No Muscle Exposed: No Joint Exposed: No Bone Exposed: No Marsan, Namiah B. ([id number]) Treatment Notes Wound #1 (Left, Posterior Lower Leg) Notes santyl, saline moistened gauze, BFD with tubi F Electronic Signature(s) Signed: 06/28/2019 12:53:43 PM By: Rodell Perna Entered By: Rodell Perna on 06/28/2019 09:01:13 Carandang, Deziya B. ([id number]) -------------------------------------------------------------------------------- Vitals Details Patient Name: Brophy, Britiany B. Date of Service: 06/28/2019 8:45 AM Medical Record Number: [id number] Patient Account Number: [id number] Date of Birth/Sex: 08/08/1940 (79 y.o. F) Treating RN:  [name] Primary Care Teckla Christiansen: [name] Other Clinician: Referring Yerick Eggebrecht: [name] Treating Staria Birkhead/Extender: [name], [name] Weeks in Treatment: 3 Vital Signs Time Taken: 08:53 Temperature (F): 97.6 Height (in): 66 Pulse (bpm): 61 Weight (lbs): 201 Respiratory Rate (breaths/min): 16 Body Mass Index (BMI): 32.4 Blood Pressure (mmHg): 167/66 Reference Range: 80 - 120 mg / dl Electronic Signature(s) Signed: 06/28/2019 2:17:28 PM By: Dayton Martes RCP, RRT, CHT Entered By: Dayton Martes on 06/28/2019 08:56:31

## 2019-07-05 ENCOUNTER — Other Ambulatory Visit: Payer: Self-pay

## 2019-07-05 ENCOUNTER — Encounter: Payer: Medicare Other | Admitting: Physician Assistant

## 2019-07-05 DIAGNOSIS — L03116 Cellulitis of left lower limb: Secondary | ICD-10-CM | POA: Diagnosis not present

## 2019-07-05 NOTE — Progress Notes (Addendum)
[name]. ([id number]) Visit Report for 07/05/2019 Chief Complaint Document Details Patient Name: [name]. Date of Service: 07/05/2019 10:15 AM Medical Record Number: [id number] Patient Account Number: [id number] Date of Birth/Sex: Feb 19, 1941 (79 y.o. F) Treating RN: Montey Hora Primary Care Provider: Emily Filbert Other Clinician: Referring Provider: Emily Filbert Treating Provider/Extender: Melburn Hake, Emmalea Treanor Weeks in Treatment: 4 Information Obtained from: Patient Chief Complaint Left posterior LE ulcer Electronic Signature(s) Signed: 07/05/2019 10:14:36 AM By: Worthy Keeler PA-C Entered By: Worthy Keeler on 07/05/2019 10:14:36 [name]. ([id number]) -------------------------------------------------------------------------------- Debridement Details Patient Name: [name]. Date of Service: 07/05/2019 10:15 AM Medical Record Number: [id number] Patient Account Number: [id number] Date of Birth/Sex: December 03, 1940 (79 y.o. F) Treating RN: Montey Hora Primary Care Provider: Emily Filbert Other Clinician: Referring Provider: Emily Filbert Treating Provider/Extender: Melburn Hake, Charron Coultas Weeks in Treatment: 4 Debridement Performed for Wound #1 Left,Posterior Lower Leg Assessment: Performed By: Physician STONE III, Risha Barretta E., PA-C Debridement Type: Debridement Level of Consciousness (Pre- Awake and Alert procedure): Pre-procedure Verification/Time Yes - 10:24 Out Taken: Start Time: 10:24 Pain Control: Lidocaine 4% Topical Solution Total Area Debrided (L x W): 3.1 (cm) x 3.4 (cm) = 10.54 (cm) Tissue and other material Viable, Non-Viable, Slough, Subcutaneous, Slough debrided: Level: Skin/Subcutaneous Tissue Debridement Description: Excisional Instrument: Curette Bleeding: Minimum Hemostasis Achieved: Pressure End Time: 10:27 Procedural Pain: 0 Post Procedural Pain: 0 Response to Treatment: Procedure was tolerated well Level of Consciousness Awake and  Alert (Post-procedure): Post Debridement Measurements of Total Wound Length: (cm) 3.1 Width: (cm) 3.4 Depth: (cm) 0.3 Volume: (cm) 2.483 Character of Wound/Ulcer Post Debridement: Improved Post Procedure Diagnosis Same as Pre-procedure Electronic Signature(s) Signed: 07/05/2019 3:03:45 PM By: Montey Hora Signed: 07/05/2019 3:41:32 PM By: Worthy Keeler PA-C Entered By: Montey Hora on 07/05/2019 10:27:54 [name]. ([id number]) -------------------------------------------------------------------------------- HPI Details Patient Name: [name]. Date of Service: 07/05/2019 10:15 AM Medical Record Number: [id number] Patient Account Number: [id number] Date of Birth/Sex: 08-Jul-1940 (79 y.o. F) Treating RN: Montey Hora Primary Care Provider: Emily Filbert Other Clinician: Referring Provider: Emily Filbert Treating Provider/Extender: Melburn Hake, Ledia Hanford Weeks in Treatment: 4 History of Present Illness HPI Description: 06/07/2019 upon evaluation today patient presents today for initial evaluation in our clinic concerning issues she is having with her left lower extremity. She tells me that she hit her leg on a rocking chair when she was getting up and this was roughly at the beginning of December. Dr. Sabra Heck who is her primary care provider has been helping with treating this area since that time. The patient states unfortunately this just is not getting any better. He is attempted salt water soaks as well as using antibiotic ointments topically unfortunately she has not noted much improvement. He wanted her to come to the wound center sooner but she declined although when things did not seem to be getting better she finally consented to this. Currently the patient does appear to have cellulitis of left lower extremity she also has atrial fibrillation noted for which she is on Eliquis this is probably part of the reason that she developed a large hematoma that she did. Subsequently she  also has hypertension as well as congestive heart failure. The patient does not appear to be febrile today and really is doing fairly well as far as that is concerned although the leg is warm to touch pretty much throughout. No fevers, chills, nausea, vomiting, or diarrhea. She has not been on any oral antibiotics. 06/28/2019 patient actually is  seen today in follow-up for her lower extremity ulcer. She is status post having actually spent 9 days in the hospital following when I sent her over for evaluation. They did have to keep her in the hospital and treat her with IV vancomycin secondary to the fact that she unfortunately had a bacteria present that obviously can be managed with oral medication with her penicillin allergy. Nonetheless her leg appears to be doing tremendously better today. There is no signs of active infection and overall very pleased with how things are progressing. I do believe that the wound is also measuring better along with looking much less irritated overall I think she is going to do quite well at this point. 07/05/2019 on evaluation today patient actually appears to be doing quite well with regard to her wound which is measuring smaller and still appears to be doing tremendously better compared to prior evaluation. I do believe the Santyl has been of benefit for her. Electronic Signature(s) Signed: 07/05/2019 2:19:25 PM By: Lenda Kelp PA-C Entered By: Lenda Kelp on 07/05/2019 14:19:25 [name]. ([id number]) -------------------------------------------------------------------------------- Physical Exam Details Patient Name: [name]. Date of Service: 07/05/2019 10:15 AM Medical Record Number: [id number] Patient Account Number: [id number] Date of Birth/Sex: 1940/06/16 (79 y.o. F) Treating RN: [name] Primary Care Provider: [name] Other Clinician: Referring Provider: [name] Treating Provider/Extender: [name], Albirta Rhinehart Weeks in  Treatment: 4 Constitutional Well-nourished and well-hydrated in no acute distress. Respiratory normal breathing without difficulty. Psychiatric this patient is able to make decisions and demonstrates good insight into disease process. Alert and Oriented x 3. pleasant and cooperative. Notes Patient's wound bed currently showed signs of good granulation at this point fortunately there is no evidence of active infection which is excellent news. I do believe that she still has a lot of drainage obviously the Santyl can even cause this to be a little bit more significant but the Santyl has loosened up the slough quite readily as well to the point that I feel like the patient is going to be able to more effectively heal this area and I think we can get some of the slough off today as well. For that reason I did proceed with sharp debridement and the patient was able to tolerate this today without complication post debridement the wound bed appears to be doing significantly better which is great news. Electronic Signature(s) Signed: 07/05/2019 2:20:09 PM By: Lenda Kelp PA-C Entered By: Lenda Kelp on 07/05/2019 14:20:09 [name]. ([id number]) -------------------------------------------------------------------------------- Physician Orders Details Patient Name: [name]. Date of Service: 07/05/2019 10:15 AM Medical Record Number: [id number] Patient Account Number: [id number] Date of Birth/Sex: 02-23-1941 (79 y.o. F) Treating RN: [name] Primary Care Provider: [name] Other Clinician: Referring Provider: [name] Treating Provider/Extender: [name], Enaya Howze Weeks in Treatment: 4 Verbal / Phone Orders: No Diagnosis Coding ICD-10 Coding Code Description [phone] Unspecified open wound, left lower leg, initial encounter L97.828 Non-pressure chronic ulcer of other part of left lower leg with other specified severity L03.116 Cellulitis of left lower limb I48.0  Paroxysmal atrial fibrillation I10 Essential (primary) hypertension I50.42 Chronic combined systolic (congestive) and diastolic (congestive) heart failure Z79.01 Long term (current) use of anticoagulants Wound Cleansing Wound #1 Left,Posterior Lower Leg o Clean wound with Normal Saline. o May shower with protection. Anesthetic (add to Medication List) Wound #1 Left,Posterior Lower Leg o Topical Lidocaine 4% cream applied to wound bed prior to debridement (In Clinic Only). Primary Wound  Dressing Wound #1 Left,Posterior Lower Leg o Santyl Ointment Secondary Dressing Wound #1 Left,Posterior Lower Leg o Saline moistened gauze o Boardered Foam Dressing Dressing Change Frequency Wound #1 Left,Posterior Lower Leg o Change dressing every day. - caregiver to change when [facility] does not visit Follow-up Appointments Wound #1 Left,Posterior Lower Leg o Return Appointment in 1 week. Edema Control Wound #1 Left,Posterior Lower Leg o Elevate legs to the level of the heart and pump ankles as often as possible o Other: - TubiGrip F Holaway, Xianna [name]. ([id number]) Medications-please add to medication list. Wound #1 Left,Posterior Lower Leg o Santyl Enzymatic Ointment Electronic Signature(s) Signed: 07/05/2019 3:03:45 PM By: [name] Signed: 07/05/2019 3:41:32 PM By: Lenda Kelp PA-C Entered By: [name] on 07/05/2019 10:28:40 Roller, Charlean [name]. ([id number]) -------------------------------------------------------------------------------- Problem List Details Patient Name: Berland, Shuntae [name]. Date of Service: 07/05/2019 10:15 AM Medical Record Number: [id number] Patient Account Number: [id number] Date of Birth/Sex: 03-06-1941 (79 y.o. F) Treating RN: [name] Primary Care Provider: [name] Other Clinician: Referring Provider: [name] Treating Provider/Extender: [name], Wallie Lagrand Weeks in Treatment: 4 Active Problems ICD-10 Evaluated Encounter Code Description  Active Date Today Diagnosis S81.802A Unspecified open wound, left lower leg, initial encounter 06/07/2019 No Yes L97.828 Non-pressure chronic ulcer of other part of left lower leg with 06/07/2019 No Yes other specified severity L03.116 Cellulitis of left lower limb 06/07/2019 No Yes I48.0 Paroxysmal atrial fibrillation 06/07/2019 No Yes I10 Essential (primary) hypertension 06/07/2019 No Yes I50.42 Chronic combined systolic (congestive) and diastolic 06/07/2019 No Yes (congestive) heart failure Z79.01 Long term (current) use of anticoagulants 06/07/2019 No Yes Inactive Problems Resolved Problems Electronic Signature(s) Signed: 07/05/2019 10:14:28 AM By: Lenda Kelp PA-C Entered By: Lenda Kelp on 07/05/2019 10:14:28 Wolter, Kindall [name]. ([id number]) -------------------------------------------------------------------------------- Progress Note Details Patient Name: [name]. Date of Service: 07/05/2019 10:15 AM Medical Record Number: [id number] Patient Account Number: [id number] Date of Birth/Sex: 1941-01-24 (79 y.o. F) Treating RN: [name] Primary Care Provider: [name] Other Clinician: Referring Provider: [name] Treating Provider/Extender: [name], Corley Kohls Weeks in Treatment: 4 Subjective Chief Complaint Information obtained from Patient Left posterior LE ulcer History of Present Illness (HPI) 06/07/2019 upon evaluation today patient presents today for initial evaluation in our clinic concerning issues she is having with her left lower extremity. She tells me that she hit her leg on a rocking chair when she was getting up and this was roughly at the beginning of December. Dr. Hyacinth Meeker who is her primary care provider has been helping with treating this area since that time. The patient states unfortunately this just is not getting any better. He is attempted salt water soaks as well as using antibiotic ointments topically unfortunately she has not noted much improvement.  He wanted her to come to the wound center sooner but she declined although when things did not seem to be getting better she finally consented to this. Currently the patient does appear to have cellulitis of left lower extremity she also has atrial fibrillation noted for which she is on Eliquis this is probably part of the reason that she developed a large hematoma that she did. Subsequently she also has hypertension as well as congestive heart failure. The patient does not appear to be febrile today and really is doing fairly well as far as that is concerned although the leg is warm to touch pretty much throughout. No fevers, chills, nausea, vomiting, or diarrhea. She has not been on any oral antibiotics. 06/28/2019 patient actually  is seen today in follow-up for her lower extremity ulcer. She is status post having actually spent 9 days in the hospital following when I sent her over for evaluation. They did have to keep her in the hospital and treat her with IV vancomycin secondary to the fact that she unfortunately had a bacteria present that obviously can be managed with oral medication with her penicillin allergy. Nonetheless her leg appears to be doing tremendously better today. There is no signs of active infection and overall very pleased with how things are progressing. I do believe that the wound is also measuring better along with looking much less irritated overall I think she is going to do quite well at this point. 07/05/2019 on evaluation today patient actually appears to be doing quite well with regard to her wound which is measuring smaller and still appears to be doing tremendously better compared to prior evaluation. I do believe the Santyl has been of benefit for her. Objective Constitutional Well-nourished and well-hydrated in no acute distress. Vitals Time Taken: 10:12 AM, Height: 66 in, Weight: 201 lbs, BMI: 32.4, Temperature: 97.5 F, Pulse: 57 bpm, Respiratory Rate: 16  breaths/min, Blood Pressure: 166/56 mmHg. Respiratory Siebert, [name]. ([id number]) normal breathing without difficulty. Psychiatric this patient is able to make decisions and demonstrates good insight into disease process. Alert and Oriented x 3. pleasant and cooperative. General Notes: Patient's wound bed currently showed signs of good granulation at this point fortunately there is no evidence of active infection which is excellent news. I do believe that she still has a lot of drainage obviously the Santyl can even cause this to be a little bit more significant but the Santyl has loosened up the slough quite readily as well to the point that I feel like the patient is going to be able to more effectively heal this area and I think we can get some of the slough off today as well. For that reason I did proceed with sharp debridement and the patient was able to tolerate this today without complication post debridement the wound bed appears to be doing significantly better which is great news. Integumentary (Hair, Skin) Wound #1 status is Open. Original cause of wound was Trauma. The wound is located on the Left,Posterior Lower Leg. The wound measures 3.1cm length x 3.4cm width x 0.2cm depth; 8.278cm^2 area and 1.656cm^3 volume. There is Fat Layer (Subcutaneous Tissue) Exposed exposed. There is no tunneling or undermining noted. There is a medium amount of serosanguineous drainage noted. The wound margin is flat and intact. There is small (1-33%) granulation within the wound bed. There is a large (67-100%) amount of necrotic tissue within the wound bed including Adherent Slough. Assessment Active Problems ICD-10 Unspecified open wound, left lower leg, initial encounter Non-pressure chronic ulcer of other part of left lower leg with other specified severity Cellulitis of left lower limb Paroxysmal atrial fibrillation Essential (primary) hypertension Chronic combined systolic (congestive) and  diastolic (congestive) heart failure Long term (current) use of anticoagulants Procedures Wound #1 Pre-procedure diagnosis of Wound #1 is a Trauma, Other located on the Left,Posterior Lower Leg . There was a Excisional Skin/Subcutaneous Tissue Debridement with a total area of 10.54 sq cm performed by STONE III, Rip Hawes E., PA-C. With the following instrument(s): Curette to remove Viable and Non-Viable tissue/material. Material removed includes Subcutaneous Tissue and Slough and after achieving pain control using Lidocaine 4% Topical Solution. No specimens were taken. A time out was conducted at 10:24, prior to the start  of the procedure. A Minimum amount of bleeding was controlled with Pressure. The procedure was tolerated well with a pain level of 0 throughout and a pain level of 0 following the procedure. Post Debridement Measurements: 3.1cm length x 3.4cm width x 0.3cm depth; 2.483cm^3 volume. Character of Wound/Ulcer Post Debridement is improved. Post procedure Diagnosis Wound #1: Same as Pre-Procedure [name]. ([id number]) Plan Wound Cleansing: Wound #1 Left,Posterior Lower Leg: Clean wound with Normal Saline. May shower with protection. Anesthetic (add to Medication List): Wound #1 Left,Posterior Lower Leg: Topical Lidocaine 4% cream applied to wound bed prior to debridement (In Clinic Only). Primary Wound Dressing: Wound #1 Left,Posterior Lower Leg: Santyl Ointment Secondary Dressing: Wound #1 Left,Posterior Lower Leg: Saline moistened gauze Boardered Foam Dressing Dressing Change Frequency: Wound #1 Left,Posterior Lower Leg: Change dressing every day. - caregiver to change when [facility] does not visit Follow-up Appointments: Wound #1 Left,Posterior Lower Leg: Return Appointment in 1 week. Edema Control: Wound #1 Left,Posterior Lower Leg: Elevate legs to the level of the heart and pump ankles as often as possible Other: - TubiGrip F Medications-please add to medication  list.: Wound #1 Left,Posterior Lower Leg: Santyl Enzymatic Ointment 1. I would recommend at this point that we continue with the current wound care measures which includes the Santyl ointment followed by a saline moistened gauze and a border foam dressing. 2. I do recommend that when they change the dressing they can try to clean the area with mild soap and water such as Dial antibacterial soap to try to get off some of the slough is much as possible. 3. Also recommend she continue to elevate her legs we will use Tubigrip F as well for some edema control. We will see patient back for reevaluation in 1 week here in the clinic. If anything worsens or changes patient will contact our office for additional recommendations. Electronic Signature(s) Signed: 07/05/2019 2:20:49 PM By: Lenda Kelp PA-C Entered By: Lenda Kelp on 07/05/2019 14:20:49 Scipio, Kenya [name]. ([id number]) -------------------------------------------------------------------------------- SuperBill Details Patient Name: Gutmann, Kashawn [name]. Date of Service: 07/05/2019 Medical Record Number: [id number] Patient Account Number: [id number] Date of Birth/Sex: [birth year] (79 y.o. F) Treating RN: [name] Primary Care Provider: [name] Other Clinician: Referring Provider: [name] Treating Provider/Extender: [name], Anasia Agro Weeks in Treatment: 4 Diagnosis Coding ICD-10 Codes Code Description [id number] Unspecified open wound, left lower leg, initial encounter L97.828 Non-pressure chronic ulcer of other part of left lower leg with other specified severity L03.116 Cellulitis of left lower limb I48.0 Paroxysmal atrial fibrillation I10 Essential (primary) hypertension I50.42 Chronic combined systolic (congestive) and diastolic (congestive) heart failure Z79.01 Long term (current) use of anticoagulants Facility Procedures CPT4 Code Description: [id number] 11042 - DEB SUBQ TISSUE 20 SQ CM/< ICD-10 Diagnosis Description L97.828  Non-pressure chronic ulcer of other part of left lower leg with Modifier: other specified Quantity: 1 severity Physician Procedures CPT4 Code Description: [id number] 11042 - WC PHYS SUBQ TISS 20 SQ CM ICD-10 Diagnosis Description L97.828 Non-pressure chronic ulcer of other part of left lower leg with Modifier: other specified s Quantity: 1 everity Electronic Signature(s) Signed: 07/05/2019 2:21:26 PM By: Lenda Kelp PA-C Entered By: Lenda Kelp on 07/05/2019 14:21:26

## 2019-07-05 NOTE — Progress Notes (Signed)
[name], [name] ([id number]) Visit Report for 07/05/2019 Arrival Information Details Patient Name: [name], [name] Date of Service: 07/05/2019 10:15 AM Medical Record Number: [id number] Patient Account Number: [id number] Date of Birth/Sex: Dec 19, 1940 (79 y.o. F) Treating RN: [name] Primary Care Dakari Stabler: [name] Other Clinician: Referring Caniyah Murley: [name] Treating Jmya Uliano/Extender: [name], [name] Weeks in Treatment: 4 Visit Information History Since Last Visit Added or deleted any medications: No Patient Arrived: Walker Pain Present Now: No Arrival Time: 10:11 Accompanied By: friend Transfer Assistance: None Patient Identification Verified: Yes Secondary Verification Process Yes Completed: Patient Has Alerts: Yes Patient Alerts: 06/11/19 L TBI 0.74 Electronic Signature(s) Signed: 07/05/2019 3:03:45 PM By: [name] Entered By: [name] on 07/05/2019 10:11:57 Denicola, [name] ([id number]) -------------------------------------------------------------------------------- Encounter Discharge Information Details Patient Name: [name], [name] Date of Service: 07/05/2019 10:15 AM Medical Record Number: [id number] Patient Account Number: [id number] Date of Birth/Sex: 1941-04-03 (79 y.o. F) Treating RN: [name] Primary Care Xavier Munger: [name] Other Clinician: Referring Jakyah Bradby: [name] Treating Alexiya Franqui/Extender: [name], [name] Weeks in Treatment: 4 Encounter Discharge Information Items Post Procedure Vitals Discharge Condition: Stable Temperature (F): 97.5 Ambulatory Status: Walker Pulse (bpm): 57 Discharge Destination: Home Respiratory Rate (breaths/min): 16 Transportation: Private Auto Blood Pressure (mmHg): 166/56 Accompanied By: self Schedule Follow-up Appointment: Yes Clinical Summary of Care: Electronic Signature(s) Signed: 07/05/2019 10:43:36 AM By: [name] Entered By: [name] on 07/05/2019 10:43:36 Glauber, [name]  ([id number]) -------------------------------------------------------------------------------- Lower Extremity Assessment Details Patient Name: [name], [name] Date of Service: 07/05/2019 10:15 AM Medical Record Number: [id number] Patient Account Number: [id number] Date of Birth/Sex: April 28, 1941 (79 y.o. F) Treating RN: [name] Primary Care Eveline Sauve: [name] Other Clinician: Referring Darnetta Kesselman: [name] Treating Eleonor Ocon/Extender: [name], [name] Weeks in Treatment: 4 Edema Assessment Assessed: [Left: No] [Right: No] Edema: [Left: N] [Right: o] Calf Left: Right: Point of Measurement: 30 cm From Medial Instep 34 cm cm Ankle Left: Right: Point of Measurement: 11 cm From Medial Instep 23.5 cm cm Vascular Assessment Pulses: Dorsalis Pedis Palpable: [Left:Yes] Electronic Signature(s) Signed: 07/05/2019 3:03:45 PM By: [name] Entered By: [name] on 07/05/2019 10:17:02 Badami, [name] ([id number]) -------------------------------------------------------------------------------- Multi Wound Chart Details Patient Name: [name], [name] Date of Service: 07/05/2019 10:15 AM Medical Record Number: [id number] Patient Account Number: [id number] Date of Birth/Sex: [birth year] (79 y.o. F) Treating RN: [name] Primary Care Irene Collings: [name] Other Clinician: Referring Renley Banwart: [name] Treating Amron Guerrette/Extender: [name], [name] Weeks in Treatment: 4 Vital Signs Height(in): 66 Pulse(bpm): 57 Weight(lbs): 201 Blood Pressure(mmHg): 166/56 Body Mass Index(BMI): 32 Temperature(F): 97.5 Respiratory Rate 16 (breaths/min): Photos: [N/A:N/A] Wound Location: Left Lower Leg - Posterior N/A N/A Wounding Event: Trauma N/A N/A Primary Etiology: Trauma, Other N/A N/A Comorbid History: Cataracts, Arrhythmia, N/A N/A Congestive Heart Failure, Hypertension, Gout Date Acquired: 04/23/2019 N/A N/A Weeks of Treatment: 4 N/A N/A Wound Status: Open N/A N/A Measurements  L x W x D 3.1x3.4x0.2 N/A N/A (cm) Area (cm) : 8.278 N/A N/A Volume (cm) : 1.656 N/A N/A % Reduction in Area: 42.90% N/A N/A % Reduction in Volume: -14.30% N/A N/A Classification: Full Thickness Without N/A N/A Exposed Support Structures Exudate Amount: Medium N/A N/A Exudate Type: Serosanguineous N/A N/A Exudate Color: red, brown N/A N/A Wound Margin: Flat and Intact N/A N/A Granulation Amount: Small (1-33%) N/A N/A Necrotic Amount: Large (67-100%) N/A N/A Exposed Structures: Fat Layer (Subcutaneous N/A N/A Tissue) Exposed: Yes Fascia: No Tendon: No Muscle: No Foye, [name] ([id number]) Joint: No Bone: No  Epithelialization: None N/A N/A Treatment Notes Electronic Signature(s) Signed: 07/05/2019 3:03:45 PM By: Montey Hora Entered By: Montey Hora on 07/05/2019 10:20:53 Medico, [name] ([id number]) -------------------------------------------------------------------------------- Multi-Disciplinary Care Plan Details Patient Name: [name], [name] Date of Service: 07/05/2019 10:15 AM Medical Record Number: [id number] Patient Account Number: [id number] Date of Birth/Sex: 09/25/1940 (79 y.o. F) Treating RN: Montey Hora Primary Care Adelayde Minney: Emily Filbert Other Clinician: Referring Semir Brill: Emily Filbert Treating Jonique Kulig/Extender: Melburn Hake, [name] Weeks in Treatment: 4 Active Inactive Abuse / Safety / Falls / Self Care Management Nursing Diagnoses: Potential for falls Goals: Patient will remain injury free related to falls Date Initiated: 06/07/2019 Target Resolution Date: 08/31/2019 Goal Status: Active Interventions: Assess fall risk on admission and as needed Notes: Necrotic Tissue Nursing Diagnoses: Impaired tissue integrity related to necrotic/devitalized tissue Goals: Necrotic/devitalized tissue will be minimized in the wound bed Date Initiated: 06/07/2019 Target Resolution Date: 08/31/2019 Goal Status: Active Interventions: Provide education on necrotic tissue  and debridement process Notes: Orientation to the Wound Care Program Nursing Diagnoses: Knowledge deficit related to the wound healing center program Goals: Patient/caregiver will verbalize understanding of the [facility] [place] [facility] Date Initiated: 06/07/2019 Target Resolution Date: 08/31/2019 Goal Status: Active Interventions: Provide education on orientation to the wound center McCoy, [name] ([id number]) Notes: Pain, Acute or Chronic Nursing Diagnoses: Pain, acute or chronic: actual or potential Goals: Patient/caregiver will verbalize adequate pain control between visits Date Initiated: 06/07/2019 Target Resolution Date: 08/31/2019 Goal Status: Active Interventions: Complete pain assessment as per visit requirements Notes: Wound/Skin Impairment Nursing Diagnoses: Impaired tissue integrity Goals: Ulcer/skin breakdown will heal within 14 weeks Date Initiated: 06/07/2019 Target Resolution Date: 08/31/2019 Goal Status: Active Interventions: Assess patient/caregiver ability to obtain necessary supplies Assess patient/caregiver ability to perform ulcer/skin care regimen upon admission and as needed Assess ulceration(s) every visit Notes: Electronic Signature(s) Signed: 07/05/2019 3:03:45 PM By: Montey Hora Entered By: Montey Hora on 07/05/2019 10:20:42 [place], [name] ([id number]) -------------------------------------------------------------------------------- Pain Assessment Details Patient Name: Schnackenberg, Briannon B. Date of Service: 07/05/2019 10:15 AM Medical Record Number: [id number] Patient Account Number: [id number] Date of Birth/Sex: 1940-06-15 (79 y.o. F) Treating RN: Montey Hora Primary Care Phinehas Grounds: Emily Filbert Other Clinician: Referring Moncia Annas: Emily Filbert Treating Jlee Harkless/Extender: Melburn Hake, [name] Weeks in Treatment: 4 Active Problems Location of Pain Severity and Description of Pain Patient Has Paino No Site Locations Pain Management and  Medication Current Pain Management: Notes Patient denies pain at this time. Electronic Signature(s) Signed: 07/05/2019 3:03:45 PM By: Montey Hora Entered By: Montey Hora on 07/05/2019 10:12:17 Holliman, [name] ([id number]) -------------------------------------------------------------------------------- Patient/Caregiver Education Details Patient Name: Koper, [name] Date of Service: 07/05/2019 10:15 AM Medical Record Number: [id number] Patient Account Number: [id number] Date of Birth/Gender: [birth year] (79 y.o. F) Treating RN: Montey Hora Primary Care Physician: Emily Filbert Other Clinician: Referring Physician: Emily Filbert Treating Physician/Extender: Sharalyn Ink in Treatment: 4 Education Assessment Education Provided To: Patient and Caregiver Education Topics Provided Wound/Skin Impairment: Handouts: Other: wound care as ordered Methods: Demonstration, Explain/Verbal Responses: State content correctly Electronic Signature(s) Signed: 07/05/2019 3:03:45 PM By: Montey Hora Entered By: Montey Hora on 07/05/2019 10:29:26 Mishra, [name] ([id number]) -------------------------------------------------------------------------------- Wound Assessment Details Patient Name: Soules, [name] Date of Service: 07/05/2019 10:15 AM Medical Record Number: [id number] Patient Account Number: [id number] Date of Birth/Sex: [birth year] (79 y.o. F) Treating RN: Montey Hora Primary Care Olive Motyka: Emily Filbert Other Clinician: Referring Deloris Mittag: Emily Filbert Treating Anela Bensman/Extender: Melburn Hake, [name] Weeks in Treatment: 4 Wound Status Wound Number: 1 Primary Trauma, Other Etiology:  Wound Location: Left Lower Leg - Posterior Wound Open Wounding Event: Trauma Status: Date Acquired: 04/23/2019 Comorbid Cataracts, Arrhythmia, Congestive Heart Weeks Of Treatment: 4 History: Failure, Hypertension, Gout Clustered Wound: No Photos Wound Measurements Length: (cm) 3.1 Width: (cm)  3.4 Depth: (cm) 0.2 Area: (cm) 8.278 Volume: (cm) 1.656 % Reduction in Area: 42.9% % Reduction in Volume: -14.3% Epithelialization: None Tunneling: No Undermining: No Wound Description Full Thickness Without Exposed Support Classification: Structures Wound Margin: Flat and Intact Exudate Medium Amount: Exudate Type: Serosanguineous Exudate Color: red, brown Foul Odor After Cleansing: No Slough/Fibrino Yes Wound Bed Granulation Amount: Small (1-33%) Exposed Structure Necrotic Amount: Large (67-100%) Fascia Exposed: No Necrotic Quality: Adherent Slough Fat Layer (Subcutaneous Tissue) Exposed: Yes Tendon Exposed: No Muscle Exposed: No Joint Exposed: No Bone Exposed: No Stargell, [name] ([id number]) Treatment Notes Wound #1 (Left, Posterior Lower Leg) Notes santyl, saline moistened gauze, BFD with tubi F Electronic Signature(s) Signed: 07/05/2019 3:03:45 PM By: [name] Entered By: [name] on 07/05/2019 10:16:05 Naser, [name] ([id number]) -------------------------------------------------------------------------------- Vitals Details Patient Name: Bouwman, [name] Date of Service: 07/05/2019 10:15 AM Medical Record Number: [id number] Patient Account Number: [id number] Date of Birth/Sex: 1940-11-29 (79 y.o. F) Treating RN: [name] Primary Care Damieon Armendariz: [name] Other Clinician: Referring Klyn Kroening: [name] Treating Gustave Lindeman/Extender: [name], [name] Weeks in Treatment: 4 Vital Signs Time Taken: 10:12 Temperature (F): 97.5 Height (in): 66 Pulse (bpm): 57 Weight (lbs): 201 Respiratory Rate (breaths/min): 16 Body Mass Index (BMI): 32.4 Blood Pressure (mmHg): 166/56 Reference Range: 80 - 120 mg / dl Electronic Signature(s) Signed: 07/05/2019 3:03:45 PM By: [name] Entered By: [name] on 07/05/2019 10:13:43

## 2019-07-07 ENCOUNTER — Other Ambulatory Visit: Payer: Self-pay | Admitting: Cardiovascular Disease

## 2019-07-08 NOTE — Telephone Encounter (Signed)
Pt's age 79, wt 91.8 kg, SCr 1.71, CrCl 39.93, last ov w/ TG 05/10/19.

## 2019-07-08 NOTE — Telephone Encounter (Signed)
Please review for refill, Thanks !  

## 2019-07-12 ENCOUNTER — Encounter: Payer: Medicare Other | Admitting: Physician Assistant

## 2019-07-12 ENCOUNTER — Other Ambulatory Visit: Payer: Self-pay

## 2019-07-12 DIAGNOSIS — L03116 Cellulitis of left lower limb: Secondary | ICD-10-CM | POA: Diagnosis not present

## 2019-07-12 NOTE — Progress Notes (Addendum)
Sak, Deana B. ([id number]) Visit Report for 07/12/2019 Chief Complaint Document Details Patient Name: [name], [name] Date of Service: 07/12/2019 12:45 PM Medical Record Number: [id number] Patient Account Number: [id number] Date of Birth/Sex: [birth year] (79 y.o. F) Treating RN: [name] Primary Care Provider: [name] Other Clinician: Referring Provider: [name] Treating Provider/Extender: [name], Nancylee Gaines Weeks in Treatment: 5 Information Obtained from: Patient Chief Complaint Left posterior LE ulcer Electronic Signature(s) Signed: 07/12/2019 1:11:38 PM By: Lenda Kelp PA-C Entered By: Lenda Kelp on 07/12/2019 13:11:38 [name], [name] ([id number]) -------------------------------------------------------------------------------- Debridement Details Patient Name: [name], [name] Date of Service: 07/12/2019 12:45 PM Medical Record Number: [id number] Patient Account Number: [id number] Date of Birth/Sex: 05/06/1941 (79 y.o. F) Treating RN: [name] Primary Care Provider: [name] Other Clinician: Referring Provider: [name] Treating Provider/Extender: [name], Jesse Nosbisch Weeks in Treatment: 5 Debridement Performed for Wound #1 Left,Posterior Lower Leg Assessment: Performed By: Physician STONE III, Lidwina Kaner E., PA-C Debridement Type: Debridement Level of Consciousness (Pre- Awake and Alert procedure): Pre-procedure Verification/Time Yes - 13:20 Out Taken: Start Time: 13:20 Pain Control: Lidocaine 4% Topical Solution Total Area Debrided (L x W): 2 (cm) x 2.8 (cm) = 5.6 (cm) Tissue and other material Viable, Non-Viable, Slough, Subcutaneous, Slough debrided: Level: Skin/Subcutaneous Tissue Debridement Description: Excisional Instrument: Curette Bleeding: Minimum Hemostasis Achieved: Pressure End Time: 13:23 Procedural Pain: 0 Post Procedural Pain: 0 Response to Treatment: Procedure was tolerated well Level of Consciousness Awake and  Alert (Post-procedure): Post Debridement Measurements of Total Wound Length: (cm) 2 Width: (cm) 2.8 Depth: (cm) 0.3 Volume: (cm) 1.319 Character of Wound/Ulcer Post Debridement: Improved Post Procedure Diagnosis Same as Pre-procedure Electronic Signature(s) Signed: 07/12/2019 4:25:46 PM By: [name] Signed: 07/14/2019 11:36:00 PM By: Lenda Kelp PA-C Entered By: [name] on 07/12/2019 13:23:17 [name], [name] ([id number]) -------------------------------------------------------------------------------- HPI Details Patient Name: [name], [name] Date of Service: 07/12/2019 12:45 PM Medical Record Number: [id number] Patient Account Number: [id number] Date of Birth/Sex: Dec 07, 1940 (79 y.o. F) Treating RN: [name] Primary Care Provider: [name] Other Clinician: Referring Provider: [name] Treating Provider/Extender: [name], Jerzie Bieri Weeks in Treatment: 5 History of Present Illness HPI Description: 06/07/2019 upon evaluation today patient presents today for initial evaluation in our clinic concerning issues she is having with her left lower extremity. She tells me that she hit her leg on a rocking chair when she was getting up and this was roughly at the beginning of December. Dr. Hyacinth Meeker who is her primary care provider has been helping with treating this area since that time. The patient states unfortunately this just is not getting any better. He is attempted salt water soaks as well as using antibiotic ointments topically unfortunately she has not noted much improvement. He wanted her to come to the wound center sooner but she declined although when things did not seem to be getting better she finally consented to this. Currently the patient does appear to have cellulitis of left lower extremity she also has atrial fibrillation noted for which she is on Eliquis this is probably part of the reason that she developed a large hematoma that she did. Subsequently she  also has hypertension as well as congestive heart failure. The patient does not appear to be febrile today and really is doing fairly well as far as that is concerned although the leg is warm to touch pretty much throughout. No fevers, chills, nausea, vomiting, or diarrhea. She has not been on any oral antibiotics. 06/28/2019 patient actually is  seen today in follow-up for her lower extremity ulcer. She is status post having actually spent 9 days in the hospital following when I sent her over for evaluation. They did have to keep her in the hospital and treat her with IV vancomycin secondary to the fact that she unfortunately had a bacteria present that obviously can be managed with oral medication with her penicillin allergy. Nonetheless her leg appears to be doing tremendously better today. There is no signs of active infection and overall very pleased with how things are progressing. I do believe that the wound is also measuring better along with looking much less irritated overall I think she is going to do quite well at this point. 07/05/2019 on evaluation today patient actually appears to be doing quite well with regard to her wound which is measuring smaller and still appears to be doing tremendously better compared to prior evaluation. I do believe the Melburn Popper has been of benefit for her. 07/12/19 patient's wound actually appears to be doing excellent at this time in regard to the left posterior lower extremity. She has been tolerating the dressing changes without complication. Fortunately there is much improvement noted here there is some slough noted she has been using Santyl I think that still appropriate. With that being said I am going to go ahead and perform sharp debridement today to clear away some of the necrotic material as well. Electronic Signature(s) Signed: 07/12/2019 4:55:34 PM By: Lenda Kelp PA-C Entered By: Lenda Kelp on 07/12/2019 16:55:34 [name], [name]  ([id number]) -------------------------------------------------------------------------------- Physical Exam Details Patient Name: [name], [name] Date of Service: 07/12/2019 12:45 PM Medical Record Number: [id number] Patient Account Number: [id number] Date of Birth/Sex: June 11, 1940 (80 y.o. F) Treating RN: [name] Primary Care Provider: [name] Other Clinician: Referring Provider: [name] Treating Provider/Extender: [name], Carlas Vandyne Weeks in Treatment: 5 Constitutional Well-nourished and well-hydrated in no acute distress. Respiratory normal breathing without difficulty. Psychiatric this patient is able to make decisions and demonstrates good insight into disease process. Alert and Oriented x 3. pleasant and cooperative. Notes His wound bed currently did require sharp debridement to remove some of the necrotic tissue from the surface of the wound. The patient tolerated this today without complication and post debridement the wound bed appears to be doing much better which is great news. Electronic Signature(s) Signed: 07/12/2019 4:55:49 PM By: Lenda Kelp PA-C Entered By: Lenda Kelp on 07/12/2019 16:55:48 [name], [name] ([id number]) -------------------------------------------------------------------------------- Physician Orders Details Patient Name: Renner, Naleah B. Date of Service: 07/12/2019 12:45 PM Medical Record Number: [id number] Patient Account Number: [id number] Date of Birth/Sex: [birth year] (79 y.o. F) Treating RN: [name] Primary Care Provider: [name] Other Clinician: Referring Provider: [name] Treating Provider/Extender: [name], Kiko Ripp Weeks in Treatment: 5 Verbal / Phone Orders: No Diagnosis Coding ICD-10 Coding Code Description [phone] Unspecified open wound, left lower leg, initial encounter L97.828 Non-pressure chronic ulcer of other part of left lower leg with other specified severity L03.116 Cellulitis of left lower  limb I48.0 Paroxysmal atrial fibrillation I10 Essential (primary) hypertension I50.42 Chronic combined systolic (congestive) and diastolic (congestive) heart failure Z79.01 Long term (current) use of anticoagulants Wound Cleansing Wound #1 Left,Posterior Lower Leg o Clean wound with Normal Saline. o May shower with protection. Anesthetic (add to Medication List) Wound #1 Left,Posterior Lower Leg o Topical Lidocaine 4% cream applied to wound bed prior to debridement (In Clinic Only). Primary Wound Dressing Wound #1 Left,Posterior Lower Leg o Santyl Ointment Secondary  Dressing Wound #1 Left,Posterior Lower Leg o Saline moistened gauze o Boardered Foam Dressing Dressing Change Frequency Wound #1 Left,Posterior Lower Leg o Change dressing every day. - caregiver to change when [facility] does not visit Follow-up Appointments Wound #1 Left,Posterior Lower Leg o Return Appointment in 1 week. Edema Control Wound #1 Left,Posterior Lower Leg o Elevate legs to the level of the heart and pump ankles as often as possible o Other: - TubiGrip F [name], [name] ([id number]) Medications-please add to medication list. Wound #1 Left,Posterior Lower Leg o Santyl Enzymatic Ointment Electronic Signature(s) Signed: 07/12/2019 4:25:46 PM By: [name] Signed: 07/14/2019 11:36:00 PM By: Lenda Kelp PA-C Entered By: [name] on 07/12/2019 13:23:37 [name], [name] ([id number]) -------------------------------------------------------------------------------- Problem List Details Patient Name: [name], [name] Date of Service: 07/12/2019 12:45 PM Medical Record Number: [id number] Patient Account Number: [id number] Date of Birth/Sex: 03-20-1941 (79 y.o. F) Treating RN: [name] Primary Care Provider: [name] Other Clinician: Referring Provider: [name] Treating Provider/Extender: [name], Xue Low Weeks in Treatment: 5 Active Problems ICD-10 Evaluated  Encounter Code Description Active Date Today Diagnosis S81.802A Unspecified open wound, left lower leg, initial encounter 06/07/2019 No Yes L97.828 Non-pressure chronic ulcer of other part of left lower leg with 06/07/2019 No Yes other specified severity L03.116 Cellulitis of left lower limb 06/07/2019 No Yes I48.0 Paroxysmal atrial fibrillation 06/07/2019 No Yes I10 Essential (primary) hypertension 06/07/2019 No Yes I50.42 Chronic combined systolic (congestive) and diastolic 06/07/2019 No Yes (congestive) heart failure Z79.01 Long term (current) use of anticoagulants 06/07/2019 No Yes Inactive Problems Resolved Problems Electronic Signature(s) Signed: 07/12/2019 1:11:32 PM By: Lenda Kelp PA-C Entered By: Lenda Kelp on 07/12/2019 13:11:32 [name], [name] ([id number]) -------------------------------------------------------------------------------- Progress Note Details Patient Name: [name], [name] Date of Service: 07/12/2019 12:45 PM Medical Record Number: [id number] Patient Account Number: [id number] Date of Birth/Sex: [birth year] (79 y.o. F) Treating RN: [name] Primary Care Provider: [name] Other Clinician: Referring Provider: [name] Treating Provider/Extender: [name], Adrea Sherpa Weeks in Treatment: 5 Subjective Chief Complaint Information obtained from Patient Left posterior LE ulcer History of Present Illness (HPI) 06/07/2019 upon evaluation today patient presents today for initial evaluation in our clinic concerning issues she is having with her left lower extremity. She tells me that she hit her leg on a rocking chair when she was getting up and this was roughly at the beginning of December. Dr. Hyacinth Meeker who is her primary care provider has been helping with treating this area since that time. The patient states unfortunately this just is not getting any better. He is attempted salt water soaks as well as using antibiotic ointments topically unfortunately she has  not noted much improvement. He wanted her to come to the wound center sooner but she declined although when things did not seem to be getting better she finally consented to this. Currently the patient does appear to have cellulitis of left lower extremity she also has atrial fibrillation noted for which she is on Eliquis this is probably part of the reason that she developed a large hematoma that she did. Subsequently she also has hypertension as well as congestive heart failure. The patient does not appear to be febrile today and really is doing fairly well as far as that is concerned although the leg is warm to touch pretty much throughout. No fevers, chills, nausea, vomiting, or diarrhea. She has not been on any oral antibiotics. 06/28/2019 patient actually is seen today in follow-up for her lower extremity ulcer.  She is status post having actually spent 9 days in the hospital following when I sent her over for evaluation. They did have to keep her in the hospital and treat her with IV vancomycin secondary to the fact that she unfortunately had a bacteria present that obviously can be managed with oral medication with her penicillin allergy. Nonetheless her leg appears to be doing tremendously better today. There is no signs of active infection and overall very pleased with how things are progressing. I do believe that the wound is also measuring better along with looking much less irritated overall I think she is going to do quite well at this point. 07/05/2019 on evaluation today patient actually appears to be doing quite well with regard to her wound which is measuring smaller and still appears to be doing tremendously better compared to prior evaluation. I do believe the Melburn Popper has been of benefit for her. 07/12/19 patient's wound actually appears to be doing excellent at this time in regard to the left posterior lower extremity. She has been tolerating the dressing changes without  complication. Fortunately there is much improvement noted here there is some slough noted she has been using Santyl I think that still appropriate. With that being said I am going to go ahead and perform sharp debridement today to clear away some of the necrotic material as well. Objective Constitutional Well-nourished and well-hydrated in no acute distress. [name], [name] ([id number]) Vitals Time Taken: 12:55 PM, Height: 66 in, Weight: 201 lbs, BMI: 32.4, Temperature: 97.5 F, Pulse: 62 bpm, Respiratory Rate: 16 breaths/min, Blood Pressure: 186/86 mmHg. Respiratory normal breathing without difficulty. Psychiatric this patient is able to make decisions and demonstrates good insight into disease process. Alert and Oriented x 3. pleasant and cooperative. General Notes: His wound bed currently did require sharp debridement to remove some of the necrotic tissue from the surface of the wound. The patient tolerated this today without complication and post debridement the wound bed appears to be doing much better which is great news. Integumentary (Hair, Skin) Wound #1 status is Open. Original cause of wound was Trauma. The wound is located on the Left,Posterior Lower Leg. The wound measures 2cm length x 2.8cm width x 0.2cm depth; 4.398cm^2 area and 0.88cm^3 volume. There is Fat Layer (Subcutaneous Tissue) Exposed exposed. There is no tunneling or undermining noted. There is a large amount of serous drainage noted. The wound margin is flat and intact. There is small (1-33%) granulation within the wound bed. There is a large (67-100%) amount of necrotic tissue within the wound bed including Adherent Slough. Assessment Active Problems ICD-10 Unspecified open wound, left lower leg, initial encounter Non-pressure chronic ulcer of other part of left lower leg with other specified severity Cellulitis of left lower limb Paroxysmal atrial fibrillation Essential (primary) hypertension Chronic combined  systolic (congestive) and diastolic (congestive) heart failure Long term (current) use of anticoagulants Procedures Wound #1 Pre-procedure diagnosis of Wound #1 is a Trauma, Other located on the Left,Posterior Lower Leg . There was a Excisional Skin/Subcutaneous Tissue Debridement with a total area of 5.6 sq cm performed by STONE III, Rayvn Rickerson E., PA-C. With the following instrument(s): Curette to remove Viable and Non-Viable tissue/material. Material removed includes Subcutaneous Tissue and Slough and after achieving pain control using Lidocaine 4% Topical Solution. No specimens were taken. A time out was conducted at 13:20, prior to the start of the procedure. A Minimum amount of bleeding was controlled with Pressure. The procedure was tolerated well with a pain  level of 0 throughout and a pain level of 0 following the procedure. Post Debridement Measurements: 2cm length x 2.8cm width x 0.3cm depth; 1.319cm^3 volume. Character of Wound/Ulcer Post Debridement is improved. Post procedure Diagnosis Wound #1: Same as Pre-Procedure [name], [name] ([id number]) Plan Wound Cleansing: Wound #1 Left,Posterior Lower Leg: Clean wound with Normal Saline. May shower with protection. Anesthetic (add to Medication List): Wound #1 Left,Posterior Lower Leg: Topical Lidocaine 4% cream applied to wound bed prior to debridement (In Clinic Only). Primary Wound Dressing: Wound #1 Left,Posterior Lower Leg: Santyl Ointment Secondary Dressing: Wound #1 Left,Posterior Lower Leg: Saline moistened gauze Boardered Foam Dressing Dressing Change Frequency: Wound #1 Left,Posterior Lower Leg: Change dressing every day. - caregiver to change when [facility] does not visit Follow-up Appointments: Wound #1 Left,Posterior Lower Leg: Return Appointment in 1 week. Edema Control: Wound #1 Left,Posterior Lower Leg: Elevate legs to the level of the heart and pump ankles as often as possible Other: - TubiGrip F Medications-please  add to medication list.: Wound #1 Left,Posterior Lower Leg: Santyl Enzymatic Ointment 1. My suggestion at this time is good to be that we go ahead and initiate a continuation of the current treatment with Santyl I think that is doing very well I would recommend that we continue down the path as long as it is improving her wound. Hopefully will be able to switch over to collagen shortly. 2. I am also can recommend continuation of the bordered foam dressing that seems to have done well. 3. We will also continue with the Tubigrip which seems to be doing excellent for her. We will see patient back for reevaluation in 1 week here in the clinic. If anything worsens or changes patient will contact our office for additional recommendations. Electronic Signature(s) Signed: 07/12/2019 4:56:29 PM By: Worthy Keeler PA-C Entered By: Worthy Keeler on 07/12/2019 16:56:29 Altamirano, [name] ([id number]) -------------------------------------------------------------------------------- SuperBill Details Patient Name: Schuknecht, Emmeline B. Date of Service: 07/12/2019 Medical Record Number: [id number] Patient Account Number: [id number] Date of Birth/Sex: 1941/05/10 (79 y.o. F) Treating RN: Montey Hora Primary Care Provider: Emily Filbert Other Clinician: Referring Provider: Emily Filbert Treating Provider/Extender: Melburn Hake, Shalyn Koral Weeks in Treatment: 5 Diagnosis Coding ICD-10 Codes Code Description [phone] Unspecified open wound, left lower leg, initial encounter L97.828 Non-pressure chronic ulcer of other part of left lower leg with other specified severity L03.116 Cellulitis of left lower limb I48.0 Paroxysmal atrial fibrillation I10 Essential (primary) hypertension I50.42 Chronic combined systolic (congestive) and diastolic (congestive) heart failure Z79.01 Long term (current) use of anticoagulants Facility Procedures CPT4 Code Description: [id number] - DEB SUBQ TISSUE 20 SQ CM/< ICD-10 Diagnosis  Description L97.828 Non-pressure chronic ulcer of other part of left lower leg with Modifier: other specified Quantity: 1 severity Physician Procedures CPT4 Code Description: [id number] - WC PHYS SUBQ TISS 20 SQ CM ICD-10 Diagnosis Description L97.828 Non-pressure chronic ulcer of other part of left lower leg with Modifier: other specified s Quantity: 1 everity Electronic Signature(s) Signed: 07/12/2019 4:56:39 PM By: Worthy Keeler PA-C Entered By: Worthy Keeler on 07/12/2019 16:56:39

## 2019-07-19 ENCOUNTER — Encounter: Payer: Medicare Other | Admitting: Physician Assistant

## 2019-07-19 ENCOUNTER — Other Ambulatory Visit: Payer: Self-pay

## 2019-07-19 DIAGNOSIS — L03116 Cellulitis of left lower limb: Secondary | ICD-10-CM | POA: Diagnosis not present

## 2019-07-19 NOTE — Progress Notes (Addendum)
Titzer, Kynnadi B. ([id number]) Visit Report for 07/19/2019 Arrival Information Details Patient Name: [name], [name] Date of Service: 07/19/2019 1:30 PM Medical Record Number: [id number] Patient Account Number: [id number] Date of Birth/Sex: March 23, 1941 (79 y.o. F) Treating RN: Montey Hora Primary Care  Grosser: Emily Filbert Other Clinician: Referring Kainalu Heggs: Emily Filbert Treating Liba Hulsey/Extender: Melburn Hake, [name] Weeks in Treatment: 6 Visit Information History Since Last Visit Added or deleted any medications: Yes Patient Arrived: Ambulatory Any new allergies or adverse reactions: No Arrival Time: 13:39 Had a fall or experienced change in No Accompanied By: friend activities of daily living that may affect Transfer Assistance: None risk of falls: Patient Identification Verified: Yes Signs or symptoms of abuse/neglect since last visito No Secondary Verification Process Completed: Yes Hospitalized since last visit: No Patient Has Alerts: Yes Implantable device outside of the clinic excluding No Patient Alerts: 06/11/19 L TBI 0.74 cellular tissue based products placed in the center since last visit: Has Dressing in Place as Prescribed: Yes Pain Present Now: No Electronic Signature(s) Signed: 07/19/2019 2:47:08 PM By: Lorine Bears RCP, RRT, CHT Entered By: Lorine Bears on 07/19/2019 13:43:02 Katonah, Caysie B. ([id number]) -------------------------------------------------------------------------------- Encounter Discharge Information Details Patient Name: [name], [name] Date of Service: 07/19/2019 1:30 PM Medical Record Number: [id number] Patient Account Number: [id number] Date of Birth/Sex: December 24, 1940 (79 y.o. F) Treating RN: Montey Hora Primary Care Geonna Lockyer: Emily Filbert Other Clinician: Referring Arik Husmann: Emily Filbert Treating Anuj Summons/Extender: Melburn Hake, [name] Weeks in Treatment: 6 Encounter Discharge Information Items Post Procedure  Vitals Discharge Condition: Stable Temperature (F): 98.4 Ambulatory Status: Ambulatory Pulse (bpm): 63 Discharge Destination: Home Respiratory Rate (breaths/min): 16 Transportation: Private Auto Blood Pressure (mmHg): 163/55 Accompanied By: caregiver Schedule Follow-up Appointment: Yes Clinical Summary of Care: Electronic Signature(s) Signed: 07/19/2019 4:07:50 PM By: Montey Hora Entered By: Montey Hora on 07/19/2019 14:00:58 Giovannetti, Margee B. ([id number]) -------------------------------------------------------------------------------- Lower Extremity Assessment Details Patient Name: Romero, Nevaen B. Date of Service: 07/19/2019 1:30 PM Medical Record Number: [id number] Patient Account Number: [id number] Date of Birth/Sex: 1941/03/29 (79 y.o. F) Treating RN: Army Melia Primary Care Samyak Sackmann: Emily Filbert Other Clinician: Referring Chauntay Paszkiewicz: Emily Filbert Treating Jiyaan Steinhauser/Extender: STONE III, [name] Weeks in Treatment: 6 Edema Assessment Assessed: [Left: No] [Right: No] Edema: [Left: N] [Right: o] Vascular Assessment Pulses: Dorsalis Pedis Palpable: [Left:Yes] Electronic Signature(s) Signed: 07/19/2019 3:13:23 PM By: Army Melia Entered By: Army Melia on 07/19/2019 13:49:40 Mccartt, Anabelen B. ([id number]) -------------------------------------------------------------------------------- Multi Wound Chart Details Patient Name: Paradiso, Lissandra B. Date of Service: 07/19/2019 1:30 PM Medical Record Number: [id number] Patient Account Number: [id number] Date of Birth/Sex: 01/04/1941 (79 y.o. F) Treating RN: Montey Hora Primary Care Daniell Paradise: Emily Filbert Other Clinician: Referring Tara Rud: Emily Filbert Treating Zari Cly/Extender: Melburn Hake, [name] Weeks in Treatment: 6 Vital Signs Height(in): 66 Pulse(bpm): 16 Weight(lbs): 201 Blood Pressure(mmHg): 163/55 Body Mass Index(BMI): 32 Temperature(F): 98.4 Respiratory Rate(breaths/min): 18 Photos: [N/A:N/A] Wound Location: Left Lower Leg  - Posterior N/A N/A Wounding Event: Trauma N/A N/A Primary Etiology: Trauma, Other N/A N/A Comorbid History: Cataracts, Arrhythmia, Congestive N/A N/A Heart Failure, Hypertension, Gout Date Acquired: 04/23/2019 N/A N/A Weeks of Treatment: 6 N/A N/A Wound Status: Open N/A N/A Measurements L x W x D (cm) 2.1x2.5x0.1 N/A N/A Area (cm) : 4.123 N/A N/A Volume (cm) : 0.412 N/A N/A % Reduction in Area: 71.50% N/A N/A % Reduction in Volume: 71.60% N/A N/A Classification: Full Thickness Without Exposed N/A N/A Support Structures Exudate Amount: Large N/A N/A Exudate Type: Serous N/A N/A Exudate Color: amber N/A N/A Wound Margin: Flat  and Intact N/A N/A Granulation Amount: Medium (34-66%) N/A N/A Necrotic Amount: Medium (34-66%) N/A N/A Exposed Structures: Fat Layer (Subcutaneous Tissue) N/A N/A Exposed: Yes Fascia: No Tendon: No Muscle: No Joint: No Bone: No Epithelialization: Small (1-33%) N/A N/A Treatment Notes Electronic Signature(s) Signed: 07/19/2019 4:07:50 PM By: [name] Entered By: [name] on 07/19/2019 13:58:33 Stroschein, Kathlyne B. ([id number]) Fels, Tommie B. ([id number]) -------------------------------------------------------------------------------- Multi-Disciplinary Care Plan Details Patient Name: Sandora, Jyasia B. Date of Service: 07/19/2019 1:30 PM Medical Record Number: [id number] Patient Account Number: [id number] Date of Birth/Sex: July 27, 1940 (79 y.o. F) Treating RN: [name] Primary Care Vila Dory: [name] Other Clinician: Referring Macallister Ashmead: [name] Treating Neziah Vogelgesang/Extender: [name], [name] Weeks in Treatment: 6 Active Inactive Abuse / Safety / Falls / Self Care Management Nursing Diagnoses: Potential for falls Goals: Patient will remain injury free related to falls Date Initiated: 06/07/2019 Target Resolution Date: 08/31/2019 Goal Status: Active Interventions: Assess fall risk on admission and as needed Notes: Necrotic Tissue Nursing  Diagnoses: Impaired tissue integrity related to necrotic/devitalized tissue Goals: Necrotic/devitalized tissue will be minimized in the wound bed Date Initiated: 06/07/2019 Target Resolution Date: 08/31/2019 Goal Status: Active Interventions: Provide education on necrotic tissue and debridement process Notes: Orientation to the Wound Care Program Nursing Diagnoses: Knowledge deficit related to the wound healing center program Goals: Patient/caregiver will verbalize understanding of the Wound Healing Center Program Date Initiated: 06/07/2019 Target Resolution Date: 08/31/2019 Goal Status: Active Interventions: Provide education on orientation to the wound center Notes: Pain, Acute or Chronic Nursing Diagnoses: Pain, acute or chronic: actual or potential Krizan, Keilana B. ([id number]) Goals: Patient/caregiver will verbalize adequate pain control between visits Date Initiated: 06/07/2019 Target Resolution Date: 08/31/2019 Goal Status: Active Interventions: Complete pain assessment as per visit requirements Notes: Wound/Skin Impairment Nursing Diagnoses: Impaired tissue integrity Goals: Ulcer/skin breakdown will heal within 14 weeks Date Initiated: 06/07/2019 Target Resolution Date: 08/31/2019 Goal Status: Active Interventions: Assess patient/caregiver ability to obtain necessary supplies Assess patient/caregiver ability to perform ulcer/skin care regimen upon admission and as needed Assess ulceration(s) every visit Notes: Electronic Signature(s) Signed: 07/19/2019 4:07:50 PM By: [name] Entered By: [name] on 07/19/2019 13:56:48 Mayhall, Roshanna B. ([id number]) -------------------------------------------------------------------------------- Pain Assessment Details Patient Name: Mapel, Arbell B. Date of Service: 07/19/2019 1:30 PM Medical Record Number: [id number] Patient Account Number: [id number] Date of Birth/Sex: July 13, 1940 (79 y.o. F) Treating RN: Rodell Perna Primary  Care Liliani Bobo: [name] Other Clinician: Referring Jamaurion Slemmer: [name] Treating Mao Lockner/Extender: [name], [name] Weeks in Treatment: 6 Active Problems Location of Pain Severity and Description of Pain Patient Has Paino No Site Locations Pain Management and Medication Current Pain Management: Electronic Signature(s) Signed: 07/19/2019 3:13:23 PM By: Rodell Perna Entered By: Rodell Perna on 07/19/2019 13:49:01 Lahue, Claramae B. ([id number]) -------------------------------------------------------------------------------- Patient/Caregiver Education Details Patient Name: Bougie, Atianna B. Date of Service: 07/19/2019 1:30 PM Medical Record Number: [id number] Patient Account Number: [id number] Date of Birth/Gender: [birth year] (79 y.o. F) Treating RN: [name] Primary Care Physician: [name] Other Clinician: Referring Physician: [name] Treating Physician/Extender: Skeet Simmer in Treatment: 6 Education Assessment Education Provided To: Patient Education Topics Provided Wound/Skin Impairment: Handouts: Other: wound care as ordered Methods: Demonstration, Explain/Verbal Responses: State content correctly Electronic Signature(s) Signed: 07/19/2019 4:07:50 PM By: [name] Entered By: [name] on 07/19/2019 14:00:10 Nienhuis, Darcee B. ([id number]) -------------------------------------------------------------------------------- Wound Assessment Details Patient Name: Michel, Yulitza B. Date of Service: 07/19/2019 1:30 PM Medical Record Number: [id number] Patient Account Number: [id number] Date of Birth/Sex: 1941-03-27 (79 y.o. F) Treating  RN: Rodell Perna Primary Care Chinedum Vanhouten: [name] Other Clinician: Referring Cabela Pacifico: [name] Treating Azizah Lisle/Extender: [name], [name] Weeks in Treatment: 6 Wound Status Wound Number: 1 Primary Trauma, Other Etiology: Wound Location: Left Lower Leg - Posterior Wound Status: Open Wounding Event: Trauma Comorbid  Cataracts, Arrhythmia, Congestive Heart Failure, Date Acquired: 04/23/2019 History: Hypertension, Gout Weeks Of Treatment: 6 Clustered Wound: No Photos Wound Measurements Length: (cm) 2.1 Width: (cm) 2.5 Depth: (cm) 0.1 Area: (cm) 4.123 Volume: (cm) 0.412 % Reduction in Area: 71.5% % Reduction in Volume: 71.6% Epithelialization: Small (1-33%) Wound Description Classification: Full Thickness Without Exposed Support Structu Wound Margin: Flat and Intact Exudate Amount: Large Exudate Type: Serous Exudate Color: amber res Foul Odor After Cleansing: No Slough/Fibrino Yes Wound Bed Granulation Amount: Medium (34-66%) Exposed Structure Necrotic Amount: Medium (34-66%) Fascia Exposed: No Necrotic Quality: Adherent Slough Fat Layer (Subcutaneous Tissue) Exposed: Yes Tendon Exposed: No Muscle Exposed: No Joint Exposed: No Bone Exposed: No Treatment Notes Wound #1 (Left, Posterior Lower Leg) Notes prisma, BFD with tubi F Electronic Signature(s) Signed: 07/19/2019 3:13:23 PM By: Melchor Amour, Dominigue B. ([id number]) Entered By: Rodell Perna on 07/19/2019 13:49:25 Swenor, Javaya B. ([id number]) -------------------------------------------------------------------------------- Vitals Details Patient Name: Zogg, Lennyn B. Date of Service: 07/19/2019 1:30 PM Medical Record Number: [id number] Patient Account Number: [id number] Date of Birth/Sex: 09-09-1940 (79 y.o. F) Treating RN: [name] Primary Care Keni Elison: [name] Other Clinician: Referring Courteney Alderete: [name] Treating Jenesa Foresta/Extender: [name], [name] Weeks in Treatment: 6 Vital Signs Time Taken: 13:40 Temperature (F): 98.4 Height (in): 66 Pulse (bpm): 63 Weight (lbs): 201 Respiratory Rate (breaths/min): 18 Body Mass Index (BMI): 32.4 Blood Pressure (mmHg): 163/55 Reference Range: 80 - 120 mg / dl Electronic Signature(s) Signed: 07/19/2019 2:47:08 PM By: Dayton Martes RCP, RRT, CHT Entered  By: Dayton Martes on 07/19/2019 13:44:17

## 2019-07-19 NOTE — Progress Notes (Addendum)
Levay, Teri B. ([id number]) Visit Report for 07/19/2019 Chief Complaint Document Details Patient Name: [name], [name] Date of Service: 07/19/2019 1:30 PM Medical Record Number: [id number] Patient Account Number: [id number] Date of Birth/Sex: July 11, 1940 (79 y.o. F) Treating RN: [name] Primary Care Provider: [name] Other Clinician: Referring Provider: [name] Treating Provider/Extender: [name], [name] Weeks in Treatment: 6 Information Obtained from: Patient Chief Complaint Left posterior LE ulcer Electronic Signature(s) Signed: 07/19/2019 1:42:55 PM By: Lenda Kelp PA-C Entered By: Lenda Kelp on 07/19/2019 13:42:54 [name], [name] ([id number]) -------------------------------------------------------------------------------- Debridement Details Patient Name: [name], [name] Date of Service: 07/19/2019 1:30 PM Medical Record Number: [id number] Patient Account Number: [id number] Date of Birth/Sex: 1940/09/23 (79 y.o. F) Treating RN: [name] Primary Care Provider: [name] Other Clinician: Referring Provider: [name] Treating Provider/Extender: [name], [name] Weeks in Treatment: 6 Debridement Performed for Wound #1 Left,Posterior Lower Leg Assessment: Performed By: Physician STONE III, [name] E., PA-C Debridement Type: Debridement Level of Consciousness (Pre- Awake and Alert procedure): Pre-procedure Verification/Time Out Yes - 13:57 Taken: Start Time: 13:57 Pain Control: Lidocaine 4% Topical Solution Total Area Debrided (L x W): 2.1 (cm) x 2.5 (cm) = 5.25 (cm) Tissue and other material debrided: Viable, Non-Viable, Slough, Subcutaneous, Slough Level: Skin/Subcutaneous Tissue Debridement Description: Excisional Instrument: Curette Bleeding: Minimum Hemostasis Achieved: Pressure End Time: 13:59 Procedural Pain: 0 Post Procedural Pain: 0 Response to Treatment: Procedure was tolerated well Level of Consciousness (Post- Awake and  Alert procedure): Post Debridement Measurements of Total Wound Length: (cm) 2.1 Width: (cm) 2.5 Depth: (cm) 0.2 Volume: (cm) 0.825 Character of Wound/Ulcer Post Debridement: Improved Post Procedure Diagnosis Same as Pre-procedure Electronic Signature(s) Signed: 07/19/2019 3:37:10 PM By: Lenda Kelp PA-C Signed: 07/19/2019 4:07:50 PM By: [name] Entered By: [name] on 07/19/2019 13:59:26 [name], [name] ([id number]) -------------------------------------------------------------------------------- HPI Details Patient Name: [name], [name] Date of Service: 07/19/2019 1:30 PM Medical Record Number: [id number] Patient Account Number: [id number] Date of Birth/Sex: 1941-01-12 (79 y.o. F) Treating RN: [name] Primary Care Provider: [name] Other Clinician: Referring Provider: [name] Treating Provider/Extender: [name], [name] Weeks in Treatment: 6 History of Present Illness HPI Description: 06/07/2019 upon evaluation today patient presents today for initial evaluation in our clinic concerning issues she is having with her left lower extremity. She tells me that she hit her leg on a rocking chair when she was getting up and this was roughly at the beginning of December. Dr. Hyacinth Meeker who is her primary care provider has been helping with treating this area since that time. The patient states unfortunately this just is not getting any better. He is attempted salt water soaks as well as using antibiotic ointments topically unfortunately she has not noted much improvement. He wanted her to come to the wound center sooner but she declined although when things did not seem to be getting better she finally consented to this. Currently the patient does appear to have cellulitis of left lower extremity she also has atrial fibrillation noted for which she is on Eliquis this is probably part of the reason that she developed a large hematoma that she did. Subsequently she also has  hypertension as well as congestive heart failure. The patient does not appear to be febrile today and really is doing fairly well as far as that is concerned although the leg is warm to touch pretty much throughout. No fevers, chills, nausea, vomiting, or diarrhea. She has not been on any oral antibiotics. 06/28/2019 patient actually  is seen today in follow-up for her lower extremity ulcer. She is status post having actually spent 9 days in the hospital following when I sent her over for evaluation. They did have to keep her in the hospital and treat her with IV vancomycin secondary to the fact that she unfortunately had a bacteria present that obviously can be managed with oral medication with her penicillin allergy. Nonetheless her leg appears to be doing tremendously better today. There is no signs of active infection and overall very pleased with how things are progressing. I do believe that the wound is also measuring better along with looking much less irritated overall I think she is going to do quite well at this point. 07/05/2019 on evaluation today patient actually appears to be doing quite well with regard to her wound which is measuring smaller and still appears to be doing tremendously better compared to prior evaluation. I do believe the [place] Popper has been of benefit for her. 07/12/19 patient's wound actually appears to be doing excellent at this time in regard to the left posterior lower extremity. She has been tolerating the dressing changes without complication. Fortunately there is much improvement noted here there is some slough noted she has been using Santyl I think that still appropriate. With that being said I am going to go ahead and perform sharp debridement today to clear away some of the necrotic material as well. 07/19/2019 upon evaluation today patient actually appears to be doing quite well in regard to her leg ulcer. She has been using Santyl and has been doing excellent with  this up to this point. However I think based on what I am seeing today we can likely proceed to doing something else, collagen, to help with getting this wound to heal more effectively and quickly. Electronic Signature(s) Signed: 07/19/2019 3:15:07 PM By: Lenda Kelp PA-C Entered By: Lenda Kelp on 07/19/2019 15:15:06 [name], [name] ([id number]) -------------------------------------------------------------------------------- Physical Exam Details Patient Name: [name], [name] Date of Service: 07/19/2019 1:30 PM Medical Record Number: [id number] Patient Account Number: [id number] Date of Birth/Sex: [birth year] (79 y.o. F) Treating RN: [name] Primary Care Provider: [name] Other Clinician: Referring Provider: [name] Treating Provider/Extender: [name], [name] Weeks in Treatment: 6 Constitutional Well-nourished and well-hydrated in no acute distress. Respiratory normal breathing without difficulty. Psychiatric this patient is able to make decisions and demonstrates good insight into disease process. Alert and Oriented x 3. pleasant and cooperative. Notes Patient's wound bed currently showed signs of good granulation at this time. Fortunately there is no evidence of active infection. No fevers, chills, nausea, vomiting, or diarrhea. I think that we are at the point where we can proceed with a collagen-based dressing especially after cleaning the wound today she has very little slough remaining I was able to to sharply debride this away and post debridement wound bed appears to be doing excellent. I think is very well prepared for the collagen. Electronic Signature(s) Signed: 07/19/2019 3:15:36 PM By: Lenda Kelp PA-C Entered By: Lenda Kelp on 07/19/2019 15:15:36 [name], [name] ([id number]) -------------------------------------------------------------------------------- Physician Orders Details Patient Name: Kessner, Wynnie B. Date of Service: 07/19/2019 1:30  PM Medical Record Number: [id number] Patient Account Number: [id number] Date of Birth/Sex: [birth year] (79 y.o. F) Treating RN: [name] Primary Care Provider: [name] Other Clinician: Referring Provider: [name] Treating Provider/Extender: [name], [name] Weeks in Treatment: 6 Verbal / Phone Orders: No Diagnosis Coding ICD-10 Coding Code Description [phone] Unspecified open wound, left  lower leg, initial encounter L97.828 Non-pressure chronic ulcer of other part of left lower leg with other specified severity L03.116 Cellulitis of left lower limb I48.0 Paroxysmal atrial fibrillation I10 Essential (primary) hypertension I50.42 Chronic combined systolic (congestive) and diastolic (congestive) heart failure Z79.01 Long term (current) use of anticoagulants Wound Cleansing Wound #1 Left,Posterior Lower Leg o Clean wound with Normal Saline. o May shower with protection. Anesthetic (add to Medication List) Wound #1 Left,Posterior Lower Leg o Topical Lidocaine 4% cream applied to wound bed prior to debridement (In Clinic Only). Primary Wound Dressing Wound #1 Left,Posterior Lower Leg o Silver Collagen - moisten with hydrogel Secondary Dressing Wound #1 Left,Posterior Lower Leg o Boardered Foam Dressing Dressing Change Frequency Wound #1 Left,Posterior Lower Leg o Change dressing every other day. Follow-up Appointments Wound #1 Left,Posterior Lower Leg o Return Appointment in 1 week. Edema Control Wound #1 Left,Posterior Lower Leg o Elevate legs to the level of the heart and pump ankles as often as possible o Other: - TubiGrip F Home Health Wound #1 Left,Posterior Lower Leg o Continue Home Health Visits o Home Health Nurse may visit PRN to address patientos wound care needs. o FACE TO FACE ENCOUNTER: MEDICARE and MEDICAID PATIENTS: I certify that this patient is under my care and that I had a face-to- face encounter that meets the physician  face-to-face encounter requirements with this patient on this date. The encounter with the patient was in whole or in part for the following MEDICAL CONDITION: (primary reason for Home Healthcare) MEDICAL NECESSITY: I certify, that based on my findings, NURSING services are a medically necessary home health service. HOME BOUND STATUS: I [name], [name] ([id number]) certify that my clinical findings support that this patient is homebound (i.e., Due to illness or injury, pt requires aid of supportive devices such as crutches, cane, wheelchairs, walkers, the use of special transportation or the assistance of another person to leave their place of residence. There is a normal inability to leave the home and doing so requires considerable and taxing effort. Other absences are for medical reasons / religious services and are infrequent or of short duration when for other reasons). o If current dressing causes regression in wound condition, may D/C ordered dressing product/s and apply Normal Saline Moist Dressing daily until next Wound Healing Center / Other MD appointment. Notify Wound Healing Center of regression in wound condition at [phone]. o Please direct any NON-WOUND related issues/requests for orders to patient's Primary Care Physician Electronic Signature(s) Signed: 07/19/2019 3:37:10 PM By: Lenda KelpStone III, [name] PA-C Signed: 07/19/2019 4:07:50 PM By: Curtis Sitesorthy, Joanna Entered By: Curtis Sitesorthy, Joanna on 07/19/2019 14:05:21 [name], [name] ([id number]) -------------------------------------------------------------------------------- Problem List Details Patient Name: Ricciuti, Linet B. Date of Service: 07/19/2019 1:30 PM Medical Record Number: [id number] Patient Account Number: [id number] Date of Birth/Sex: 1941/01/11 (79 y.o. F) Treating RN: Curtis Sitesorthy, Joanna Primary Care Provider: Bethann PunchesMiller, Mark Other Clinician: Referring Provider: Bethann PunchesMiller, Mark Treating Provider/Extender: Linwood DibblesSTONE III, [name] Weeks in Treatment:  6 Active Problems ICD-10 Evaluated Encounter Code Description Active Date Today Diagnosis S81.802A Unspecified open wound, left lower leg, initial encounter 06/07/2019 No Yes L97.828 Non-pressure chronic ulcer of other part of left lower leg with other 06/07/2019 No Yes specified severity L03.116 Cellulitis of left lower limb 06/07/2019 No Yes I48.0 Paroxysmal atrial fibrillation 06/07/2019 No Yes I10 Essential (primary) hypertension 06/07/2019 No Yes I50.42 Chronic combined systolic (congestive) and diastolic (congestive) heart 06/07/2019 No Yes failure Z79.01 Long term (current) use of anticoagulants 06/07/2019 No Yes Inactive Problems Resolved Problems Electronic  Signature(s) Signed: 07/19/2019 1:42:45 PM By: Lenda Kelp PA-C Entered By: Lenda Kelp on 07/19/2019 13:42:44 Heinemann, Alejandra B. ([id number]) -------------------------------------------------------------------------------- Progress Note Details Patient Name: [name], [name] Date of Service: 07/19/2019 1:30 PM Medical Record Number: [id number] Patient Account Number: [id number] Date of Birth/Sex: 05-03-1941 (79 y.o. F) Treating RN: [name] Primary Care Provider: [name] Other Clinician: Referring Provider: [name] Treating Provider/Extender: [name], [name] Weeks in Treatment: 6 Subjective Chief Complaint Information obtained from Patient Left posterior LE ulcer History of Present Illness (HPI) 06/07/2019 upon evaluation today patient presents today for initial evaluation in our clinic concerning issues she is having with her left lower extremity. She tells me that she hit her leg on a rocking chair when she was getting up and this was roughly at the beginning of December. Dr. Hyacinth Meeker who is her primary care provider has been helping with treating this area since that time. The patient states unfortunately this just is not getting any better. He is attempted salt water soaks as well as using antibiotic  ointments topically unfortunately she has not noted much improvement. He wanted her to come to the wound center sooner but she declined although when things did not seem to be getting better she finally consented to this. Currently the patient does appear to have cellulitis of left lower extremity she also has atrial fibrillation noted for which she is on Eliquis this is probably part of the reason that she developed a large hematoma that she did. Subsequently she also has hypertension as well as congestive heart failure. The patient does not appear to be febrile today and really is doing fairly well as far as that is concerned although the leg is warm to touch pretty much throughout. No fevers, chills, nausea, vomiting, or diarrhea. She has not been on any oral antibiotics. 06/28/2019 patient actually is seen today in follow-up for her lower extremity ulcer. She is status post having actually spent 9 days in the hospital following when I sent her over for evaluation. They did have to keep her in the hospital and treat her with IV vancomycin secondary to the fact that she unfortunately had a bacteria present that obviously can be managed with oral medication with her penicillin allergy. Nonetheless her leg appears to be doing tremendously better today. There is no signs of active infection and overall very pleased with how things are progressing. I do believe that the wound is also measuring better along with looking much less irritated overall I think she is going to do quite well at this point. 07/05/2019 on evaluation today patient actually appears to be doing quite well with regard to her wound which is measuring smaller and still appears to be doing tremendously better compared to prior evaluation. I do believe the Mel[place] Popper has been of benefit for her. 07/12/19 patient's wound actually appears to be doing excellent at this time in regard to the left posterior lower extremity. She has been tolerating  the dressing changes without complication. Fortunately there is much improvement noted here there is some slough noted she has been using Santyl I think that still appropriate. With that being said I am going to go ahead and perform sharp debridement today to clear away some of the necrotic material as well. 07/19/2019 upon evaluation today patient actually appears to be doing quite well in regard to her leg ulcer. She has been using Santyl and has been doing excellent with this up to this point. However I think  based on what I am seeing today we can likely proceed to doing something else, collagen, to help with getting this wound to heal more effectively and quickly. Objective Constitutional Well-nourished and well-hydrated in no acute distress. Vitals Time Taken: 1:40 PM, Height: 66 in, Weight: 201 lbs, BMI: 32.4, Temperature: 98.4 F, Pulse: 63 bpm, Respiratory Rate: 18 breaths/min, Blood Pressure: 163/55 mmHg. Respiratory normal breathing without difficulty. Psychiatric this patient is able to make decisions and demonstrates good insight into disease process. Alert and Oriented x 3. pleasant and cooperative. [name], [name] ([id number]) General Notes: Patient's wound bed currently showed signs of good granulation at this time. Fortunately there is no evidence of active infection. No fevers, chills, nausea, vomiting, or diarrhea. I think that we are at the point where we can proceed with a collagen-based dressing especially after cleaning the wound today she has very little slough remaining I was able to to sharply debride this away and post debridement wound bed appears to be doing excellent. I think is very well prepared for the collagen. Integumentary (Hair, Skin) Wound #1 status is Open. Original cause of wound was Trauma. The wound is located on the Left,Posterior Lower Leg. The wound measures 2.1cm length x 2.5cm width x 0.1cm depth; 4.123cm^2 area and 0.412cm^3 volume. There is Fat  Layer (Subcutaneous Tissue) Exposed exposed. There is a large amount of serous drainage noted. The wound margin is flat and intact. There is medium (34-66%) granulation within the wound bed. There is a medium (34-66%) amount of necrotic tissue within the wound bed including Adherent Slough. Assessment Active Problems ICD-10 Unspecified open wound, left lower leg, initial encounter Non-pressure chronic ulcer of other part of left lower leg with other specified severity Cellulitis of left lower limb Paroxysmal atrial fibrillation Essential (primary) hypertension Chronic combined systolic (congestive) and diastolic (congestive) heart failure Long term (current) use of anticoagulants Procedures Wound #1 Pre-procedure diagnosis of Wound #1 is a Trauma, Other located on the Left,Posterior Lower Leg . There was a Excisional Skin/Subcutaneous Tissue Debridement with a total area of 5.25 sq cm performed by STONE III, [name] E., PA-C. With the following instrument(s): Curette to remove Viable and Non-Viable tissue/material. Material removed includes Subcutaneous Tissue and Slough and after achieving pain control using Lidocaine 4% Topical Solution. No specimens were taken. A time out was conducted at 13:57, prior to the start of the procedure. A Minimum amount of bleeding was controlled with Pressure. The procedure was tolerated well with a pain level of 0 throughout and a pain level of 0 following the procedure. Post Debridement Measurements: 2.1cm length x 2.5cm width x 0.2cm depth; 0.825cm^3 volume. Character of Wound/Ulcer Post Debridement is improved. Post procedure Diagnosis Wound #1: Same as Pre-Procedure Plan Wound Cleansing: Wound #1 Left,Posterior Lower Leg: Clean wound with Normal Saline. May shower with protection. Anesthetic (add to Medication List): Wound #1 Left,Posterior Lower Leg: Topical Lidocaine 4% cream applied to wound bed prior to debridement (In Clinic Only). Primary Wound  Dressing: Wound #1 Left,Posterior Lower Leg: Silver Collagen - moisten with hydrogel Secondary Dressing: Wound #1 Left,Posterior Lower Leg: Boardered Foam Dressing Dressing Change Frequency: Wound #1 Left,Posterior Lower Leg: Change dressing every other day. Follow-up Appointments: Wound #1 Left,Posterior Lower Leg: [name], [name] ([id number]) Return Appointment in 1 week. Edema Control: Wound #1 Left,Posterior Lower Leg: Elevate legs to the level of the heart and pump ankles as often as possible Other: - [place]: Wound #1 Left,Posterior Lower Leg: [place] Visits Home  Health Nurse may visit PRN to address patient s wound care needs. FACE TO FACE ENCOUNTER: MEDICARE and MEDICAID PATIENTS: I certify that this patient is under my care and that I had a face-to-face encounter that meets the physician face-to-face encounter requirements with this patient on this date. The encounter with the patient was in whole or in part for the following MEDICAL CONDITION: (primary reason for Home Healthcare) MEDICAL NECESSITY: I certify, that based on my findings, NURSING services are a medically necessary home health service. HOME BOUND STATUS: I certify that my clinical findings support that this patient is homebound (i.e., Due to illness or injury, pt requires aid of supportive devices such as crutches, cane, wheelchairs, walkers, the use of special transportation or the assistance of another person to leave their place of residence. There is a normal inability to leave the home and doing so requires considerable and taxing effort. Other absences are for medical reasons / religious services and are infrequent or of short duration when for other reasons). If current dressing causes regression in wound condition, may D/C ordered dressing product/s and apply Normal Saline Moist Dressing daily until next Wound Healing Center / Other MD appointment. Notify Wound Healing Center of  regression in wound condition at [phone]. Please direct any NON-WOUND related issues/requests for orders to patient's Primary Care Physician 1. My suggestion at this time is good to me that we go ahead and initiate treatment with the silver collagen dressing we will put the Santyl on hold and we will see how things do. 2. I am also can recommend that she continue to cover the area with a border foam dressing we will also use hydrogel to moisten this collagen so that hopefully will not dry out too much. 3. I am also going to recommend that we continue with the Tubigrip as that seems to be beneficial for her at this point. We will see patient back for reevaluation in 1 week here in the clinic. If anything worsens or changes patient will contact our office for additional recommendations. Electronic Signature(s) Signed: 07/19/2019 3:16:11 PM By: Lenda Kelp PA-C Entered By: Lenda Kelp on 07/19/2019 15:16:11 [name], [name] ([id number]) -------------------------------------------------------------------------------- SuperBill Details Patient Name: [name], [name] Date of Service: 07/19/2019 Medical Record Number: [id number] Patient Account Number: [id number] Date of Birth/Sex: 06-29-1940 (79 y.o. F) Treating RN: [name] Primary Care Provider: [name] Other Clinician: Referring Provider: [name] Treating Provider/Extender: [name], [name] Weeks in Treatment: 6 Diagnosis Coding ICD-10 Codes Code Description [phone] Unspecified open wound, left lower leg, initial encounter L97.828 Non-pressure chronic ulcer of other part of left lower leg with other specified severity L03.116 Cellulitis of left lower limb I48.0 Paroxysmal atrial fibrillation I10 Essential (primary) hypertension I50.42 Chronic combined systolic (congestive) and diastolic (congestive) heart failure Z79.01 Long term (current) use of anticoagulants Facility Procedures CPT4 Code: [id number] Description:  11042 - DEB SUBQ TISSUE 20 SQ CM/< Modifier: Quantity: 1 CPT4 Code: Description: ICD-10 Diagnosis Description L97.828 Non-pressure chronic ulcer of other part of left lower leg with other specifie Modifier: d severity Quantity: Physician Procedures CPT4 Code: [id number] Description: 11042 - WC PHYS SUBQ TISS 20 SQ CM Modifier: Quantity: 1 CPT4 Code: Description: ICD-10 Diagnosis Description L97.828 Non-pressure chronic ulcer of other part of left lower leg with other specifie Modifier: d severity Quantity: Electronic Signature(s) Signed: 07/19/2019 3:16:21 PM By: Lenda Kelp PA-C Entered By: Lenda Kelp on 07/19/2019 15:16:20

## 2019-07-26 ENCOUNTER — Encounter: Payer: Medicare Other | Attending: Physician Assistant | Admitting: Physician Assistant

## 2019-07-26 ENCOUNTER — Other Ambulatory Visit: Payer: Self-pay

## 2019-07-26 DIAGNOSIS — I110 Hypertensive heart disease with heart failure: Secondary | ICD-10-CM | POA: Insufficient documentation

## 2019-07-26 DIAGNOSIS — Z7901 Long term (current) use of anticoagulants: Secondary | ICD-10-CM | POA: Insufficient documentation

## 2019-07-26 DIAGNOSIS — I5042 Chronic combined systolic (congestive) and diastolic (congestive) heart failure: Secondary | ICD-10-CM | POA: Diagnosis not present

## 2019-07-26 DIAGNOSIS — L03116 Cellulitis of left lower limb: Secondary | ICD-10-CM | POA: Diagnosis not present

## 2019-07-26 DIAGNOSIS — M109 Gout, unspecified: Secondary | ICD-10-CM | POA: Diagnosis not present

## 2019-07-26 DIAGNOSIS — I480 Paroxysmal atrial fibrillation: Secondary | ICD-10-CM | POA: Diagnosis not present

## 2019-07-26 DIAGNOSIS — W2203XA Walked into furniture, initial encounter: Secondary | ICD-10-CM | POA: Insufficient documentation

## 2019-07-26 NOTE — Progress Notes (Signed)
Springs, Diamonique B. ([id number]) Visit Report for 07/12/2019 Arrival Information Details Patient Name: [name], [name] Date of Service: 07/12/2019 12:45 PM Medical Record Number: [id number] Patient Account Number: [id number] Date of Birth/Sex: 01/30/1941 (79 y.o. F) Treating RN: Montey Hora Primary Care Damonte Frieson: Emily Filbert Other Clinician: Referring Aamari West: Emily Filbert Treating Darriel Sinquefield/Extender: Melburn Hake, [name] Weeks in Treatment: 5 Visit Information History Since Last Visit Added or deleted any medications: No Patient Arrived: Ambulatory Any new allergies or adverse reactions: No Arrival Time: 12:54 Had a fall or experienced change in No Accompanied By: friend activities of daily living that may affect Transfer Assistance: None risk of falls: Patient Identification Verified: Yes Signs or symptoms of abuse/neglect since last visito No Secondary Verification Process Completed: Yes Hospitalized since last visit: No Patient Has Alerts: Yes Implantable device outside of the clinic excluding No Patient Alerts: 06/11/19 L TBI 0.74 cellular tissue based products placed in the center since last visit: Has Dressing in Place as Prescribed: Yes Pain Present Now: No Electronic Signature(s) Signed: 07/12/2019 4:27:22 PM By: Lorine Bears RCP, RRT, CHT Entered By: Lorine Bears on 07/12/2019 12:55:38 Shively, Jaselle B. ([id number]) -------------------------------------------------------------------------------- Encounter Discharge Information Details Patient Name: [name], [name] Date of Service: 07/12/2019 12:45 PM Medical Record Number: [id number] Patient Account Number: [id number] Date of Birth/Sex: 1940-09-26 (79 y.o. F) Treating RN: Montey Hora Primary Care Kerri-Anne Haeberle: Emily Filbert Other Clinician: Referring Kyland No: Emily Filbert Treating Jessicca Stitzer/Extender: Melburn Hake, [name] Weeks in Treatment: 5 Encounter Discharge Information Items Post Procedure  Vitals Discharge Condition: Stable Temperature (F): 97.5 Ambulatory Status: Ambulatory Pulse (bpm): 62 Discharge Destination: Home Respiratory Rate (breaths/min): 16 Transportation: Private Auto Blood Pressure (mmHg): 186/86 Accompanied By: caregiver Schedule Follow-up Appointment: Yes Clinical Summary of Care: Electronic Signature(s) Signed: 07/12/2019 4:25:46 PM By: Montey Hora Entered By: Montey Hora on 07/12/2019 13:24:45 Tipler, Hema B. ([id number]) -------------------------------------------------------------------------------- Lower Extremity Assessment Details Patient Name: [name], [name] Date of Service: 07/12/2019 12:45 PM Medical Record Number: [id number] Patient Account Number: [id number] Date of Birth/Sex: Apr 14, 1941 (79 y.o. F) Treating RN: Cornell Barman Primary Care Harmon Bommarito: Emily Filbert Other Clinician: Referring Akaila Rambo: Emily Filbert Treating Tallie Dodds/Extender: Melburn Hake, [name] Weeks in Treatment: 5 Edema Assessment Assessed: [Left: No] [Right: No] [Left: Edema] [Right: :] Calf Left: Right: Point of Measurement: 30 cm From Medial Instep 34.5 cm cm Ankle Left: Right: Point of Measurement: 11 cm From Medial Instep 24.2 cm cm Electronic Signature(s) Signed: 07/26/2019 5:40:44 PM By: Gretta Cool, [name], RN, [name], [name] RN, [name] Entered By: Gretta Cool, [name], RN, [name], [name] on 07/12/2019 13:06:12 [place], Jonnelle B. ([id number]) -------------------------------------------------------------------------------- Multi Wound Chart Details Patient Name: [name], [name] Date of Service: 07/12/2019 12:45 PM Medical Record Number: [id number] Patient Account Number: [id number] Date of Birth/Sex: 1941/02/17 (79 y.o. F) Treating RN: Montey Hora Primary Care Angline Schweigert: Emily Filbert Other Clinician: Referring Sha Amer: Emily Filbert Treating Dakota Vanwart/Extender: Melburn Hake, [name] Weeks in Treatment: 5 Vital Signs Height(in): 66 Pulse(bpm): 62 Weight(lbs): 201 Blood Pressure(mmHg): 186/86 Body Mass  Index(BMI): 32 Temperature(F): 97.5 Respiratory Rate(breaths/min): 16 Photos: [N/A:N/A] Wound Location: Left Lower Leg - Posterior N/A N/A Wounding Event: Trauma N/A N/A Primary Etiology: Trauma, Other N/A N/A Comorbid History: Cataracts, Arrhythmia, Congestive N/A N/A Heart Failure, Hypertension, Gout Date Acquired: 04/23/2019 N/A N/A Weeks of Treatment: 5 N/A N/A Wound Status: Open N/A N/A Measurements L x W x D (cm) 2x2.8x0.2 N/A N/A Area (cm) : 4.398 N/A N/A Volume (cm) : 0.88 N/A N/A % Reduction in Area: 69.70% N/A N/A % Reduction in Volume: 39.30% N/A  N/A Classification: Full Thickness Without Exposed N/A N/A Support Structures Exudate Amount: Large N/A N/A Exudate Type: Serous N/A N/A Exudate Color: amber N/A N/A Wound Margin: Flat and Intact N/A N/A Granulation Amount: Small (1-33%) N/A N/A Necrotic Amount: Large (67-100%) N/A N/A Exposed Structures: Fat Layer (Subcutaneous Tissue) N/A N/A Exposed: Yes Fascia: No Tendon: No Muscle: No Joint: No Bone: No Epithelialization: Small (1-33%) N/A N/A Treatment Notes Electronic Signature(s) Signed: 07/12/2019 4:25:46 PM By: [name] Entered By: [name] on 07/12/2019 13:21:18 [name], [name] ([id number]) [name], [name] ([id number]) -------------------------------------------------------------------------------- Multi-Disciplinary Care Plan Details Patient Name: [name], [name] Date of Service: 07/12/2019 12:45 PM Medical Record Number: [id number] Patient Account Number: [id number] Date of Birth/Sex: 23-Jul-1940 (79 y.o. F) Treating RN: [name] Primary Care Jaesean Litzau: [name] Other Clinician: Referring Domonick Sittner: [name] Treating Shakala Marlatt/Extender: [name], [name] Weeks in Treatment: 5 Active Inactive Abuse / Safety / Falls / Self Care Management Nursing Diagnoses: Potential for falls Goals: Patient will remain injury free related to falls Date Initiated: 06/07/2019 Target Resolution Date:  08/31/2019 Goal Status: Active Interventions: Assess fall risk on admission and as needed Notes: Necrotic Tissue Nursing Diagnoses: Impaired tissue integrity related to necrotic/devitalized tissue Goals: Necrotic/devitalized tissue will be minimized in the wound bed Date Initiated: 06/07/2019 Target Resolution Date: 08/31/2019 Goal Status: Active Interventions: Provide education on necrotic tissue and debridement process Notes: Orientation to the Wound Care Program Nursing Diagnoses: Knowledge deficit related to the wound healing center program Goals: Patient/caregiver will verbalize understanding of the Wound Healing Center Program Date Initiated: 06/07/2019 Target Resolution Date: 08/31/2019 Goal Status: Active Interventions: Provide education on orientation to the wound center Notes: Pain, Acute or Chronic Nursing Diagnoses: Pain, acute or chronic: actual or potential [name], [name] ([id number]) Goals: Patient/caregiver will verbalize adequate pain control between visits Date Initiated: 06/07/2019 Target Resolution Date: 08/31/2019 Goal Status: Active Interventions: Complete pain assessment as per visit requirements Notes: Wound/Skin Impairment Nursing Diagnoses: Impaired tissue integrity Goals: Ulcer/skin breakdown will heal within 14 weeks Date Initiated: 06/07/2019 Target Resolution Date: 08/31/2019 Goal Status: Active Interventions: Assess patient/caregiver ability to obtain necessary supplies Assess patient/caregiver ability to perform ulcer/skin care regimen upon admission and as needed Assess ulceration(s) every visit Notes: Electronic Signature(s) Signed: 07/12/2019 4:25:46 PM By: [name] Entered By: [name] on 07/12/2019 13:19:39 [name], [name] ([id number]) -------------------------------------------------------------------------------- Pain Assessment Details Patient Name: [name], [name] Date of Service: 07/12/2019 12:45 PM Medical Record Number:  [id number] Patient Account Number: [id number] Date of Birth/Sex: November 30, 1940 (79 y.o. F) Treating RN: [name] Primary Care Modest Draeger: [name] Other Clinician: Referring Alpha Chouinard: [name] Treating Wyndell Cardiff/Extender: [name], [name] Weeks in Treatment: 5 Active Problems Location of Pain Severity and Description of Pain Patient Has Paino No Site Locations Pain Management and Medication Current Pain Management: Notes Patient has headache. Patient denies wound pain at this time. Electronic Signature(s) Signed: 07/26/2019 5:40:44 PM By: [name], [name], RN, [name], [name] RN, [name] Entered By: [name], [name], RN, [name], [name] on 07/12/2019 13:00:33 [name], [name] ([id number]) -------------------------------------------------------------------------------- Patient/Caregiver Education Details Patient Name: Golebiewski, Keylee B. Date of Service: 07/12/2019 12:45 PM Medical Record Number: [id number] Patient Account Number: [id number] Date of Birth/Gender: 16-Oct-1940 (79 y.o. F) Treating RN: [name] Primary Care Physician: [name] Other Clinician: Referring Physician: [name] Treating Physician/Extender: Skeet Simmer in Treatment: 5 Education Assessment Education Provided To: Patient and Caregiver Education Topics Provided Wound/Skin Impairment: Handouts: Other: wound care as ordered Methods: Demonstration, Explain/Verbal Responses: State content correctly Electronic Signature(s)  Signed: 07/12/2019 4:25:46 PM By: [name] Entered By: [name] on 07/12/2019 13:23:57 [name], [name] ([id number]) -------------------------------------------------------------------------------- Wound Assessment Details Patient Name: [name], [name] Date of Service: 07/12/2019 12:45 PM Medical Record Number: [id number] Patient Account Number: [id number] Date of Birth/Sex: 1940-12-31 (79 y.o. F) Treating RN: [name] Primary Care Wylee Dorantes: [name] Other Clinician: Referring Tirzah Fross: [name] Treating Lynnzie Blackson/Extender: [name], [name] Weeks in Treatment: 5 Wound Status Wound Number: 1 Primary Trauma, Other Etiology: Wound Location: Left Lower Leg - Posterior Wound Status: Open Wounding Event: Trauma Comorbid Cataracts, Arrhythmia, Congestive Heart Failure, Date Acquired: 04/23/2019 History: Hypertension, Gout Weeks Of Treatment: 5 Clustered Wound: No Photos Wound Measurements Length: (cm) 2 Width: (cm) 2.8 Depth: (cm) 0.2 Area: (cm) 4.398 Volume: (cm) 0.88 % Reduction in Area: 69.7% % Reduction in Volume: 39.3% Epithelialization: Small (1-33%) Tunneling: No Undermining: No Wound Description Classification: Full Thickness Without Exposed Support Structu Wound Margin: Flat and Intact Exudate Amount: Large Exudate Type: Serous Exudate Color: amber res Foul Odor After Cleansing: No Slough/Fibrino Yes Wound Bed Granulation Amount: Small (1-33%) Exposed Structure Necrotic Amount: Large (67-100%) Fascia Exposed: No Necrotic Quality: Adherent Slough Fat Layer (Subcutaneous Tissue) Exposed: Yes Tendon Exposed: No Muscle Exposed: No Joint Exposed: No Bone Exposed: No Electronic Signature(s) Signed: 07/26/2019 5:40:44 PM By: [name], [name], RN, [name], [name] RN, [name] Entered By: [name], [name], RN, [name], [name] on 07/12/2019 13:03:52 Bucklew, Kandie B. ([id number]) -------------------------------------------------------------------------------- Vitals Details Patient Name: Eggenberger, Jinnie B. Date of Service: 07/12/2019 12:45 PM Medical Record Number: [id number] Patient Account Number: [id number] Date of Birth/Sex: 1940/07/11 (79 y.o. F) Treating RN: [name] Primary Care Griffin Dewilde: [name] Other Clinician: Referring Hallel Denherder: [name] Treating Dajanee Voorheis/Extender: [name], [name] Weeks in Treatment: 5 Vital Signs Time Taken: 12:55 Temperature (F): 97.5 Height (in): 66 Pulse (bpm): 62 Weight (lbs): 201 Respiratory Rate (breaths/min): 16 Body Mass Index (BMI):  32.4 Blood Pressure (mmHg): 186/86 Reference Range: 80 - 120 mg / dl Electronic Signature(s) Signed: 07/12/2019 4:27:22 PM By: Dayton Martes RCP, RRT, CHT Entered By: Dayton Martes on 07/12/2019 12:56:05

## 2019-07-26 NOTE — Progress Notes (Addendum)
Catania, Elainna B. ([id number]) Visit Report for 07/26/2019 Chief Complaint Document Details Patient Name: Bushnell, Nyeshia B. Date of Service: 07/26/2019 12:45 PM Medical Record Number: [id number] Patient Account Number: [id number] Date of Birth/Sex: 1940/10/13 (79 y.o. F) Treating RN: [name] Primary Care Provider: [name] Other Clinician: Referring Provider: [name] Treating Provider/Extender: [name], Katara Griner Weeks in Treatment: 7 Information Obtained from: Patient Chief Complaint Left posterior LE ulcer Electronic Signature(s) Signed: 07/26/2019 12:51:05 PM By: Lenda Kelp PA-C Entered By: Lenda Kelp on 07/26/2019 12:51:05 Polimeni, Akira B. ([id number]) -------------------------------------------------------------------------------- HPI Details Patient Name: Gouveia, Delani B. Date of Service: 07/26/2019 12:45 PM Medical Record Number: [id number] Patient Account Number: [id number] Date of Birth/Sex: August 18, 1940 (79 y.o. F) Treating RN: [name] Primary Care Provider: [name] Other Clinician: Referring Provider: [name] Treating Provider/Extender: [name], Beyza Bellino Weeks in Treatment: 7 History of Present Illness HPI Description: 06/07/2019 upon evaluation today patient presents today for initial evaluation in our clinic concerning issues she is having with her left lower extremity. She tells me that she hit her leg on a rocking chair when she was getting up and this was roughly at the beginning of December. Dr. Hyacinth Meeker who is her primary care provider has been helping with treating this area since that time. The patient states unfortunately this just is not getting any better. He is attempted salt water soaks as well as using antibiotic ointments topically unfortunately she has not noted much improvement. He wanted her to come to the wound center sooner but she declined although when things did not seem to be getting better she finally consented to this. Currently the  patient does appear to have cellulitis of left lower extremity she also has atrial fibrillation noted for which she is on Eliquis this is probably part of the reason that she developed a large hematoma that she did. Subsequently she also has hypertension as well as congestive heart failure. The patient does not appear to be febrile today and really is doing fairly well as far as that is concerned although the leg is warm to touch pretty much throughout. No fevers, chills, nausea, vomiting, or diarrhea. She has not been on any oral antibiotics. 06/28/2019 patient actually is seen today in follow-up for her lower extremity ulcer. She is status post having actually spent 9 days in the hospital following when I sent her over for evaluation. They did have to keep her in the hospital and treat her with IV vancomycin secondary to the fact that she unfortunately had a bacteria present that obviously can be managed with oral medication with her penicillin allergy. Nonetheless her leg appears to be doing tremendously better today. There is no signs of active infection and overall very pleased with how things are progressing. I do believe that the wound is also measuring better along with looking much less irritated overall I think she is going to do quite well at this point. 07/05/2019 on evaluation today patient actually appears to be doing quite well with regard to her wound which is measuring smaller and still appears to be doing tremendously better compared to prior evaluation. I do believe the [place] Popper has been of benefit for her. 07/12/19 patient's wound actually appears to be doing excellent at this time in regard to the left posterior lower extremity. She has been tolerating the dressing changes without complication. Fortunately there is much improvement noted here there is some slough noted she has been using Santyl I think that still appropriate. With  that being said I am going to go ahead and perform sharp  debridement today to clear away some of the necrotic material as well. 07/19/2019 upon evaluation today patient actually appears to be doing quite well in regard to her leg ulcer. She has been using Santyl and has been doing excellent with this up to this point. However I think based on what I am seeing today we can likely proceed to doing something else, collagen, to help with getting this wound to heal more effectively and quickly. 07/26/2019 upon evaluation today patient appears to be doing excellent in regard to her lower extremity ulcer. She has been tolerating the dressing changes without complication. Fortunately the wound is continuing to measure smaller and does seem to be progressing quite nicely. I am very pleased with how things are going at this point. The patient likewise is extremely happy. Electronic Signature(s) Signed: 07/26/2019 1:10:06 PM By: Lenda Kelp PA-C Entered By: Lenda Kelp on 07/26/2019 13:10:06 Cutsforth, Averiana B. ([id number]) -------------------------------------------------------------------------------- Physical Exam Details Patient Name: Loor, Estefanny B. Date of Service: 07/26/2019 12:45 PM Medical Record Number: [id number] Patient Account Number: [id number] Date of Birth/Sex: Dec 19, 1940 (79 y.o. F) Treating RN: [name] Primary Care Provider: [name] Other Clinician: Referring Provider: [name] Treating Provider/Extender: [name], Myldred Raju Weeks in Treatment: 7 Constitutional Well-nourished and well-hydrated in no acute distress. Respiratory normal breathing without difficulty. Psychiatric this patient is able to make decisions and demonstrates good insight into disease process. Alert and Oriented x 3. pleasant and cooperative. Notes Patient's wound bed currently showed signs of good granulation at this point. There is no evidence of active infection which is also good news. Overall I do feel like the collagen has been of benefit for her. No sharp  debridement was necessary today I was able to mechanically clean off the wound without complication. Electronic Signature(s) Signed: 07/26/2019 1:10:38 PM By: Lenda Kelp PA-C Entered By: Lenda Kelp on 07/26/2019 13:10:37 Henes, Emberley B. ([id number]) -------------------------------------------------------------------------------- Physician Orders Details Patient Name: Adamik, Mishayla B. Date of Service: 07/26/2019 12:45 PM Medical Record Number: [id number] Patient Account Number: [id number] Date of Birth/Sex: December 05, 1940 (79 y.o. F) Treating RN: [name] Primary Care Provider: [name] Other Clinician: Referring Provider: [name] Treating Provider/Extender: [name], Ferdie Bakken Weeks in Treatment: 7 Verbal / Phone Orders: No Diagnosis Coding ICD-10 Coding Code Description S81.802A Unspecified open wound, left lower leg, initial encounter L97.828 Non-pressure chronic ulcer of other part of left lower leg with other specified severity L03.116 Cellulitis of left lower limb I48.0 Paroxysmal atrial fibrillation I10 Essential (primary) hypertension I50.42 Chronic combined systolic (congestive) and diastolic (congestive) heart failure Z79.01 Long term (current) use of anticoagulants Wound Cleansing Wound #1 Left,Posterior Lower Leg o Clean wound with Normal Saline. o May shower with protection. Anesthetic (add to Medication List) Wound #1 Left,Posterior Lower Leg o Topical Lidocaine 4% cream applied to wound bed prior to debridement (In Clinic Only). Primary Wound Dressing Wound #1 Left,Posterior Lower Leg o Silver Collagen - moisten with hydrogel Secondary Dressing Wound #1 Left,Posterior Lower Leg o Boardered Foam Dressing Dressing Change Frequency Wound #1 Left,Posterior Lower Leg o Change dressing every other day. Follow-up Appointments Wound #1 Left,Posterior Lower Leg o Return Appointment in 1 week. Edema Control Wound #1 Left,Posterior Lower Leg o  Elevate legs to the level of the heart and pump ankles as often as possible o Other: - TubiGrip F Home Health Wound #1 Left,Posterior Lower Leg o Continue Home Health Visits o  Home Health Nurse may visit PRN to address patientos wound care needs. o FACE TO FACE ENCOUNTER: MEDICARE and MEDICAID PATIENTS: I certify that this patient is under my care and that I had a face-to- face encounter that meets the physician face-to-face encounter requirements with this patient on this date. The encounter with the patient was in whole or in part for the following MEDICAL CONDITION: (primary reason for Home Healthcare) MEDICAL NECESSITY: I certify, that based on my findings, NURSING services are a medically necessary home health service. HOME BOUND STATUS: I Freer, Ruey B. ([id number]) certify that my clinical findings support that this patient is homebound (i.e., Due to illness or injury, pt requires aid of supportive devices such as crutches, cane, wheelchairs, walkers, the use of special transportation or the assistance of another person to leave their place of residence. There is a normal inability to leave the home and doing so requires considerable and taxing effort. Other absences are for medical reasons / religious services and are infrequent or of short duration when for other reasons). o If current dressing causes regression in wound condition, may D/C ordered dressing product/s and apply Normal Saline Moist Dressing daily until next Wound Healing Center / Other MD appointment. Notify Wound Healing Center of regression in wound condition at [phone]. o Please direct any NON-WOUND related issues/requests for orders to patient's Primary Care Physician Electronic Signature(s) Signed: 07/26/2019 4:34:53 PM By: [name] Signed: 07/26/2019 5:00:47 PM By: Lenda Kelp PA-C Entered By: [name] on 07/26/2019 13:05:46 Ortega, Janellie B.  ([id number]) -------------------------------------------------------------------------------- Problem List Details Patient Name: Kimball, Ersa B. Date of Service: 07/26/2019 12:45 PM Medical Record Number: [id number] Patient Account Number: [id number] Date of Birth/Sex: March 03, 1941 (79 y.o. F) Treating RN: [name] Primary Care Provider: [name] Other Clinician: Referring Provider: [name] Treating Provider/Extender: [name], Misti Towle Weeks in Treatment: 7 Active Problems ICD-10 Evaluated Encounter Code Description Active Date Today Diagnosis S81.802A Unspecified open wound, left lower leg, initial encounter 06/07/2019 No Yes L97.828 Non-pressure chronic ulcer of other part of left lower leg with other 06/07/2019 No Yes specified severity L03.116 Cellulitis of left lower limb 06/07/2019 No Yes I48.0 Paroxysmal atrial fibrillation 06/07/2019 No Yes I10 Essential (primary) hypertension 06/07/2019 No Yes I50.42 Chronic combined systolic (congestive) and diastolic (congestive) heart 06/07/2019 No Yes failure Z79.01 Long term (current) use of anticoagulants 06/07/2019 No Yes Inactive Problems Resolved Problems Electronic Signature(s) Signed: 07/26/2019 12:50:59 PM By: Lenda Kelp PA-C Entered By: Lenda Kelp on 07/26/2019 12:50:59 Gow, Tyronica B. ([id number]) -------------------------------------------------------------------------------- Progress Note Details Patient Name: Tingley, Isabele B. Date of Service: 07/26/2019 12:45 PM Medical Record Number: [id number] Patient Account Number: [id number] Date of Birth/Sex: 15-Apr-1941 (79 y.o. F) Treating RN: [name] Primary Care Provider: [name] Other Clinician: Referring Provider: [name] Treating Provider/Extender: [name], Ricardo Schubach Weeks in Treatment: 7 Subjective Chief Complaint Information obtained from Patient Left posterior LE ulcer History of Present Illness (HPI) 06/07/2019 upon evaluation today patient presents  today for initial evaluation in our clinic concerning issues she is having with her left lower extremity. She tells me that she hit her leg on a rocking chair when she was getting up and this was roughly at the beginning of December. Dr. Hyacinth Meeker who is her primary care provider has been helping with treating this area since that time. The patient states unfortunately this just is not getting any better. He is attempted salt water soaks as well as using antibiotic ointments topically unfortunately she has not  noted much improvement. He wanted her to come to the wound center sooner but she declined although when things did not seem to be getting better she finally consented to this. Currently the patient does appear to have cellulitis of left lower extremity she also has atrial fibrillation noted for which she is on Eliquis this is probably part of the reason that she developed a large hematoma that she did. Subsequently she also has hypertension as well as congestive heart failure. The patient does not appear to be febrile today and really is doing fairly well as far as that is concerned although the leg is warm to touch pretty much throughout. No fevers, chills, nausea, vomiting, or diarrhea. She has not been on any oral antibiotics. 06/28/2019 patient actually is seen today in follow-up for her lower extremity ulcer. She is status post having actually spent 9 days in the hospital following when I sent her over for evaluation. They did have to keep her in the hospital and treat her with IV vancomycin secondary to the fact that she unfortunately had a bacteria present that obviously can be managed with oral medication with her penicillin allergy. Nonetheless her leg appears to be doing tremendously better today. There is no signs of active infection and overall very pleased with how things are progressing. I do believe that the wound is also measuring better along with looking much less irritated overall I  think she is going to do quite well at this point. 07/05/2019 on evaluation today patient actually appears to be doing quite well with regard to her wound which is measuring smaller and still appears to be doing tremendously better compared to prior evaluation. I do believe the Melburn PopperSantyl has been of benefit for her. 07/12/19 patient's wound actually appears to be doing excellent at this time in regard to the left posterior lower extremity. She has been tolerating the dressing changes without complication. Fortunately there is much improvement noted here there is some slough noted she has been using Santyl I think that still appropriate. With that being said I am going to go ahead and perform sharp debridement today to clear away some of the necrotic material as well. 07/19/2019 upon evaluation today patient actually appears to be doing quite well in regard to her leg ulcer. She has been using Santyl and has been doing excellent with this up to this point. However I think based on what I am seeing today we can likely proceed to doing something else, collagen, to help with getting this wound to heal more effectively and quickly. 07/26/2019 upon evaluation today patient appears to be doing excellent in regard to her lower extremity ulcer. She has been tolerating the dressing changes without complication. Fortunately the wound is continuing to measure smaller and does seem to be progressing quite nicely. I am very pleased with how things are going at this point. The patient likewise is extremely happy. Objective Constitutional Well-nourished and well-hydrated in no acute distress. Vitals Time Taken: 12:49 PM, Height: 66 in, Weight: 201 lbs, BMI: 32.4, Temperature: 97.3 F, Pulse: 66 bpm, Respiratory Rate: 16 breaths/min, Blood Pressure: 177/69 mmHg. Respiratory normal breathing without difficulty. Araiza, Sammie B. ([id number]) Psychiatric this patient is able to make decisions and demonstrates good insight  into disease process. Alert and Oriented x 3. pleasant and cooperative. General Notes: Patient's wound bed currently showed signs of good granulation at this point. There is no evidence of active infection which is also good news. Overall I do feel like  the collagen has been of benefit for her. No sharp debridement was necessary today I was able to mechanically clean off the wound without complication. Integumentary (Hair, Skin) Wound #1 status is Open. Original cause of wound was Trauma. The wound is located on the Left,Posterior Lower Leg. The wound measures 1.8cm length x 1.8cm width x 0.1cm depth; 2.545cm^2 area and 0.254cm^3 volume. There is Fat Layer (Subcutaneous Tissue) Exposed exposed. There is no tunneling or undermining noted. There is a large amount of serous drainage noted. The wound margin is flat and intact. There is medium (34-66%) granulation within the wound bed. There is a medium (34-66%) amount of necrotic tissue within the wound bed including Adherent Slough. Assessment Active Problems ICD-10 Unspecified open wound, left lower leg, initial encounter Non-pressure chronic ulcer of other part of left lower leg with other specified severity Cellulitis of left lower limb Paroxysmal atrial fibrillation Essential (primary) hypertension Chronic combined systolic (congestive) and diastolic (congestive) heart failure Long term (current) use of anticoagulants Plan Wound Cleansing: Wound #1 Left,Posterior Lower Leg: Clean wound with Normal Saline. May shower with protection. Anesthetic (add to Medication List): Wound #1 Left,Posterior Lower Leg: Topical Lidocaine 4% cream applied to wound bed prior to debridement (In Clinic Only). Primary Wound Dressing: Wound #1 Left,Posterior Lower Leg: Silver Collagen - moisten with hydrogel Secondary Dressing: Wound #1 Left,Posterior Lower Leg: Boardered Foam Dressing Dressing Change Frequency: Wound #1 Left,Posterior Lower Leg: Change  dressing every other day. Follow-up Appointments: Wound #1 Left,Posterior Lower Leg: Return Appointment in 1 week. Edema Control: Wound #1 Left,Posterior Lower Leg: Elevate legs to the level of the heart and pump ankles as often as possible Other: - [place]: Wound #1 Left,Posterior Lower Leg: [place] Nurse may visit PRN to address patient s wound care needs. FACE TO FACE ENCOUNTER: MEDICARE and MEDICAID PATIENTS: I certify that this patient is under my care and that I had a face-to-face encounter that meets the physician face-to-face encounter requirements with this patient on this date. The encounter with the patient was in whole or in part for the following MEDICAL CONDITION: (primary reason for [place]) MEDICAL NECESSITY: I certify, that based on my findings, NURSING services are a medically necessary home health service. HOME BOUND STATUS: I certify that my clinical findings support that this patient is homebound (i.e., Due to illness or injury, pt requires aid of supportive devices such as crutches, cane, wheelchairs, walkers, the use of special transportation or the assistance of another person to leave their place of residence. There is a normal inability to leave the home and doing so requires [place], [place] ([id number]) considerable and taxing effort. Other absences are for medical reasons / religious services and are infrequent or of short duration when for other reasons). If current dressing causes regression in wound condition, may D/C ordered dressing product/s and apply Normal Saline Moist Dressing daily until next [place] / Other MD appointment. [place] of regression in wound condition at [phone]. Please direct any NON-WOUND related issues/requests for orders to patient's Primary Care Physician 1. My suggestion at this time is good to be that we go ahead and continue with the current  wound care measures including the silver collagen dressing which seems to be doing excellent for the patient. 2. I am also can recommend that the patient continue with the Tubigrip F which seems to be doing well as far as keeping the edema under good control. 3.  We will continue to cover this with a border foam dressing which does seem to be protecting this quite nicely. We will see patient back for reevaluation in 1 week here in the clinic. If anything worsens or changes patient will contact our office for additional recommendations. Electronic Signature(s) Signed: 07/26/2019 1:11:23 PM By: Lenda Kelp PA-C Entered By: Lenda Kelp on 07/26/2019 13:11:23 Jerry, Eun B. ([id number]) -------------------------------------------------------------------------------- SuperBill Details Patient Name: Basinski, Zandrea B. Date of Service: 07/26/2019 Medical Record Number: [id number] Patient Account Number: [id number] Date of Birth/Sex: [birth year] (79 y.o. F) Treating RN: [name] Primary Care Provider: [name] Other Clinician: Referring Provider: [name] Treating Provider/Extender: [name], Rashaun Wichert Weeks in Treatment: 7 Diagnosis Coding ICD-10 Codes Code Description [phone] Unspecified open wound, left lower leg, initial encounter L97.828 Non-pressure chronic ulcer of other part of left lower leg with other specified severity L03.116 Cellulitis of left lower limb I48.0 Paroxysmal atrial fibrillation I10 Essential (primary) hypertension I50.42 Chronic combined systolic (congestive) and diastolic (congestive) heart failure Z79.01 Long term (current) use of anticoagulants Facility Procedures CPT4 Code: [id number] Description: 99213 - WOUND CARE VISIT-LEV 3 EST PT Modifier: Quantity: 1 Physician Procedures CPT4 Code: [id number] Description: 99213 - WC PHYS LEVEL 3 - EST PT Modifier: Quantity: 1 CPT4 Code: Description: ICD-10 Diagnosis Description S81.802A Unspecified open wound,  left lower leg, initial encounter L97.828 Non-pressure chronic ulcer of other part of left lower leg with other specifi L03.116 Cellulitis of left lower limb I48.0 Paroxysmal  atrial fibrillation Modifier: ed severity Quantity: Electronic Signature(s) Signed: 07/26/2019 1:12:33 PM By: Lenda Kelp PA-C Entered By: Lenda Kelp on 07/26/2019 13:12:33

## 2019-07-26 NOTE — Progress Notes (Signed)
[name], [name] ([id number]) Visit Report for 07/26/2019 Arrival Information Details Patient Name: [name], [name] Date of Service: 07/26/2019 12:45 PM Medical Record Number: [id number] Patient Account Number: [id number] Date of Birth/Sex: 10-Apr-1941 (79 y.o. F) Treating RN: [name] Primary Care [name]: [name] Other Clinician: Referring [name]: [name] Treating [name]/Extender: [name], [name]: 7 Visit Information History Since Last Visit Added or deleted any medications: No Patient Arrived: Ambulatory Pain Present Now: No Arrival Time: 12:49 Accompanied By: friend Transfer Assistance: None Patient Identification Verified: Yes Secondary Verification Process Completed: Yes Patient Has Alerts: Yes Patient Alerts: 06/11/19 L TBI 0.74 Electronic Signature(s) Signed: 07/26/2019 5:14:53 PM By: [name], [name], RN, [name], [name] RN, [name] Entered By: [name], [name], RN, [name], [name] on 07/26/2019 12:49:41 [name], [name] ([id number]) -------------------------------------------------------------------------------- Clinic Level of Care Assessment Details Patient Name: [name], [name] Date of Service: 07/26/2019 12:45 PM Medical Record Number: [id number] Patient Account Number: [id number] Date of Birth/Sex: [birth year] (79 y.o. F) Treating RN: [name] Primary Care [name]: [name] Other Clinician: Referring [name]: [name] Treating [name]/Extender: [name], [name]: 7 Clinic Level of Care Assessment Items TOOL 4 Quantity Score []  - Use when only an EandM is performed on FOLLOW-UP visit 0 ASSESSMENTS - Nursing Assessment / Reassessment X - Reassessment of Co-morbidities (includes updates in patient status) 1 10 X- 1 5 Reassessment of Adherence to [name] Plan ASSESSMENTS - Wound and Skin Assessment / Reassessment X - Simple Wound Assessment / Reassessment - one wound 1 5 []  - 0 Complex Wound Assessment / Reassessment - multiple wounds []  -  0 Dermatologic / Skin Assessment (not related to wound area) ASSESSMENTS - Focused Assessment []  - Circumferential Edema Measurements - multi extremities 0 []  - 0 Nutritional Assessment / Counseling / Intervention X- 1 5 Lower Extremity Assessment (monofilament, tuning fork, pulses) []  - 0 Peripheral Arterial Disease Assessment (using hand held doppler) ASSESSMENTS - Ostomy and/or Continence Assessment and Care []  - Incontinence Assessment and Management 0 []  - 0 Ostomy Care Assessment and Management (repouching, etc.) PROCESS - Coordination of Care X - Simple Patient / Family Education for ongoing care 1 15 []  - 0 Complex (extensive) Patient / Family Education for ongoing care X- 1 10 Staff obtains , Records, Test Results / Process Orders []  - 0 Staff telephones HHA, Nursing Homes / Clarify orders / etc []  - 0 Routine Transfer to another Facility (non-emergent condition) []  - 0 Routine Hospital Admission (non-emergent condition) []  - 0 New Admissions / / Ordering NPWT, Apligraf, etc. []  - 0 Emergency Hospital Admission (emergent condition) X- 1 10 Simple Discharge Coordination []  - 0 Complex (extensive) Discharge Coordination PROCESS - Special Needs []  - Pediatric / Minor Patient Management 0 []  - 0 Isolation Patient Management []  - 0 Hearing / Language / Visual special needs []  - 0 Assessment of Community assistance (transportation, D/C planning, etc.) [name], [name] ( ) []  - 0 Additional assistance / Altered mentation []  - 0 Support Surface(s) Assessment (bed, cushion, seat, etc.) INTERVENTIONS - Wound Cleansing / Measurement X - Simple Wound Cleansing - one wound 1 5 []  - 0 Complex Wound Cleansing - multiple wounds X- 1 5 Wound Imaging (photographs - any number of wounds) []  - 0 Wound Tracing (instead of photographs) X- 1 5 Simple Wound Measurement - one wound []  - 0 Complex Wound Measurement - multiple  wounds INTERVENTIONS - Wound Dressings X - Small Wound Dressing one or multiple wounds 1 10 []  -  0 Medium Wound Dressing one or multiple wounds []  - 0 Large Wound Dressing one or multiple wounds []  - 0 Application of Medications - topical []  - 0 Application of Medications - injection INTERVENTIONS - Miscellaneous []  - External ear exam 0 []  - 0 Specimen Collection (cultures, biopsies, blood, body fluids, etc.) []  - 0 Specimen(s) / Culture(s) sent or taken to Lab for analysis []  - 0 Patient Transfer (multiple staff / Lift / Similar devices) []  - 0 Simple Staple / Suture removal (25 or less) []  - 0 Complex Staple / Suture removal (26 or more) []  - 0 Hypo / Hyperglycemic Management (close monitor of Blood Glucose) []  - 0 Ankle / Brachial Index (ABI) - do not check if billed separately X- 1 5 Vital Signs Has the patient been seen at the hospital within the last three years: Yes Total Score: 90 Level Of Care: New/Established - Level 3 Electronic Signature(s) Signed: 07/26/2019 4:34:53 PM By: Entered By: on 07/26/2019 13:06:23 [name], [name] ( ) -------------------------------------------------------------------------------- Encounter Discharge Information Details Patient Name: [name], [name] Date of Service: 07/26/2019 12:45 PM Medical Record Number: [name] Patient Account Number: Date of Birth/Sex: 09-Mar-1941 (79 y.o. F) Treating RN: Primary Care [name]: [name] Other Clinician: Referring [name]: [name] Treating [name]/Extender: [name], [name]: 7 Encounter Discharge Information Items Discharge Condition: Stable Ambulatory Status: Ambulatory Discharge Destination: Home Transportation: Private Auto Accompanied By: caregiver Schedule Follow-up Appointment: Yes Clinical Summary of Care: Electronic Signature(s) Signed: 07/26/2019 4:34:53 PM By: [id number] Entered  By: [name] on 07/26/2019 13:06:59 [name], [name] ([id number]) -------------------------------------------------------------------------------- Lower Extremity Assessment Details Patient Name: [name], [name] Date of Service: 07/26/2019 12:45 PM Medical Record Number: [id number] Patient Account Number: [name] Date of Birth/Sex: 18-Sep-1940 (79 y.o. F) Treating RN: [name] Primary Care Emilyn Ruble: [name] Other Clinician: Referring Beecher Furio: [name] Treating Danija Gosa/Extender: [name], [name]: 7 Edema Assessment Assessed: [Left: No] [Right: No] Edema: [Left: N] [Right: o] Vascular Assessment Pulses: Dorsalis Pedis Palpable: [Left:Yes] Electronic Signature(s) Signed: 07/26/2019 5:14:53 PM By: [id number], [name], RN, [name], [name] RN, [name] Entered By: [name], [name], RN, [name], [name] on 07/26/2019 12:57:29 Sigg, Latice B. ([id number]) -------------------------------------------------------------------------------- Multi Wound Chart Details Patient Name: Hanks, Teryl B. Date of Service: 07/26/2019 12:45 PM Medical Record Number: [id number] Patient Account Number: [name] Date of Birth/Sex: 1940/06/28 (79 y.o. F) Treating RN: [name] Primary Care Arriyah Madej: [name] Other Clinician: Referring Airis Barbee: [name] Treating Khole Arterburn/Extender: [name], [name]: 7 Vital Signs Height(in): 66 Pulse(bpm): 66 Weight(lbs): 201 Blood Pressure(mmHg): 177/69 Body Mass Index(BMI): 32 Temperature(F): 97.3 Respiratory Rate(breaths/min): 16 Photos: [N/A:N/A] Wound Location: Left Lower Leg - Posterior N/A N/A Wounding Event: Trauma N/A N/A Primary Etiology: Trauma, Other N/A N/A Comorbid History: Cataracts, Arrhythmia, Congestive N/A N/A Heart Failure, Hypertension, Gout Date Acquired: 04/23/2019 N/A N/A Weeks of [name]: 7 N/A N/A Wound Status: Open N/A N/A Measurements L x W x D (cm) 1.8x1.8x0.1 N/A N/A Area (cm) : 2.545 N/A N/A Volume (cm) :  0.254 N/A N/A % Reduction in Area: 82.40% N/A N/A % Reduction in Volume: 82.50% N/A N/A Classification: Full Thickness Without Exposed N/A N/A Support Structures Exudate Amount: Large N/A N/A Exudate Type: Serous N/A N/A Exudate Color: amber N/A N/A Wound Margin: Flat and Intact N/A N/A Granulation Amount: Medium (34-66%) N/A N/A Necrotic Amount: Medium (34-66%) N/A N/A Exposed Structures: Fat Layer (Subcutaneous Tissue) N/A N/A Exposed: Yes Fascia: No Tendon:  No Muscle: No Joint: No Bone: No Epithelialization: Medium (34-66%) N/A N/A [name] Notes Electronic Signature(s) Signed: 07/26/2019 4:34:53 PM By: [name] Entered By: [name] on 07/26/2019 13:04:13 [name], [name] ([id number]) [name], [name] ([id number]) -------------------------------------------------------------------------------- Multi-Disciplinary Care Plan Details Patient Name: [name], [name] Date of Service: 07/26/2019 12:45 PM Medical Record Number: [id number] Patient Account Number: [id number] Date of Birth/Sex: 11-08-1940 (79 y.o. F) Treating RN: [name] Primary Care Kam Kushnir: [name] Other Clinician: Referring Delorus Langwell: [name] Treating Lum Stillinger/Extender: [name], [name]: 7 Active Inactive Abuse / Safety / Falls / Self Care Management Nursing Diagnoses: Potential for falls Goals: Patient will remain injury free related to falls Date Initiated: 06/07/2019 Target Resolution Date: 08/31/2019 Goal Status: Active Interventions: Assess fall risk on admission and as needed Notes: Necrotic Tissue Nursing Diagnoses: Impaired tissue integrity related to necrotic/devitalized tissue Goals: Necrotic/devitalized tissue will [name] minimized in the wound bed Date Initiated: 06/07/2019 Target Resolution Date: 08/31/2019 Goal Status: Active Interventions: Provide education on necrotic tissue and debridement process Notes: Orientation to the Wound Care Program Nursing  Diagnoses: Knowledge deficit related to the wound healing center program Goals: Patient/caregiver will verbalize understanding of the Wound Healing Center Program Date Initiated: 06/07/2019 Target Resolution Date: 08/31/2019 Goal Status: Active Interventions: Provide education on orientation to the wound center Notes: Pain, Acute or Chronic Nursing Diagnoses: Pain, acute or chronic: actual or potential [name], [name] ([id number]) Goals: Patient/caregiver will verbalize adequate pain control between visits Date Initiated: 06/07/2019 Target Resolution Date: 08/31/2019 Goal Status: Active Interventions: Complete pain assessment as per visit requirements Notes: Wound/Skin Impairment Nursing Diagnoses: Impaired tissue integrity Goals: Ulcer/skin breakdown will heal within 14 weeks Date Initiated: 06/07/2019 Target Resolution Date: 08/31/2019 Goal Status: Active Interventions: Assess patient/caregiver ability to obtain necessary supplies Assess patient/caregiver ability to perform ulcer/skin care regimen upon admission and as needed Assess ulceration(s) every visit Notes: Electronic Signature(s) Signed: 07/26/2019 4:34:53 PM By: [name] Entered By: [name] on 07/26/2019 13:04:04 [name], [name] ([id number]) -------------------------------------------------------------------------------- Pain Assessment Details Patient Name: [name], [name] Date of Service: 07/26/2019 12:45 PM Medical Record Number: [id number] Patient Account Number: [id number] Date of Birth/Sex: 1940-08-26 (79 y.o. F) Treating RN: [name] Primary Care Lenor Provencher: [name] Other Clinician: Referring Avian Konigsberg: [name] Treating Valaree Fresquez/Extender: [name], [name]: 7 Active Problems Location of Pain Severity and Description of Pain Patient Has Paino No Site Locations Pain Management and Medication Current Pain Management: Notes Patient denies pain at this time. Electronic  Signature(s) Signed: 07/26/2019 5:14:53 PM By: [name], [name], RN, [name], [name] RN, [name] Entered By: [name], [name], RN, [name], [name] on 07/26/2019 12:52:10 Behrmann, Capria B. ([id number]) -------------------------------------------------------------------------------- Patient/Caregiver Education Details Patient Name: Mutchler, Kemoni B. Date of Service: 07/26/2019 12:45 PM Medical Record Number: [id number] Patient Account Number: [id number] Date of Birth/Gender: 25-May-1940 (79 y.o. F) Treating RN: [name] Primary Care Physician: [name] Other Clinician: Referring Physician: Bethann [name] Treating Physician/Extender: Skeet Simmer in [name]: 7 Education Assessment Education Provided To: Patient Education Topics Provided Wound/Skin Impairment: Handouts: Other: wound care as ordered Methods: Demonstration, Explain/Verbal Responses: State content correctly Electronic Signature(s) Signed: 07/26/2019 4:34:53 PM By: [name] Entered By: [name] on 07/26/2019 13:04:45 Cangemi, Augustine B. ([id number]) -------------------------------------------------------------------------------- Wound Assessment Details Patient Name: Brees, Latreshia B. Date of Service: 07/26/2019 12:45 PM Medical Record Number: [id number] Patient Account Number: [id number] Date of Birth/Sex: 07-24-1940 (79 y.o. F) Treating RN: [name] Primary Care Maija Biggers: [name] Other Clinician: Referring Leiani Enright: [name]  Treating Latina Frank/Extender: STONE III, [name]: 7 Wound Status Wound Number: 1 Primary Trauma, Other Etiology: Wound Location: Left Lower Leg - Posterior Wound Status: Open Wounding Event: Trauma Comorbid Cataracts, Arrhythmia, Congestive Heart Failure, Date Acquired: 04/23/2019 History: Hypertension, Gout Weeks Of [name]: 7 Clustered Wound: No Photos Wound Measurements Length: (cm) 1.8 Width: (cm) 1.8 Depth: (cm) 0.1 Area: (cm) 2.545 Volume: (cm) 0.254 % Reduction in Area:  82.4% % Reduction in Volume: 82.5% Epithelialization: Medium (34-66%) Tunneling: No Undermining: No Wound Description Classification: Full Thickness Without Exposed Support Structu Wound Margin: Flat and Intact Exudate Amount: Large Exudate Type: Serous Exudate Color: amber res Foul Odor After Cleansing: No Slough/Fibrino Yes Wound Bed Granulation Amount: Medium (34-66%) Exposed Structure Necrotic Amount: Medium (34-66%) Fascia Exposed: No Necrotic Quality: Adherent Slough Fat Layer (Subcutaneous Tissue) Exposed: Yes Tendon Exposed: No Muscle Exposed: No Joint Exposed: No Bone Exposed: No [name] Notes Wound #1 (Left, Posterior Lower Leg) Notes prisma, BFD with tubi F Electronic Signature(s) Signed: 07/26/2019 5:14:53 PM By: Gretta Cool, [name], RN, [name], [name] RN, [name] Moylan, Nakeia B. ([id number]) Entered By: Gretta Cool, [name], RN, [name], [name] on 07/26/2019 12:56:58 [place], [place]isle ([id number]) -------------------------------------------------------------------------------- Rafael Hernandez Details Patient Name: Laban, Mariachristina B. Date of Service: 07/26/2019 12:45 PM Medical Record Number: [id number] Patient Account Number: [id number] Date of Birth/Sex: [birth year] (79 y.o. F) Treating RN: Cornell Barman Primary Care Jontavious Commons: Emily Filbert Other Clinician: Referring Linnie Delgrande: Emily Filbert Treating Mihira Tozzi/Extender: Melburn Hake, [name]: 7 Vital Signs Time Taken: 12:49 Temperature (F): 97.3 Height (in): 66 Pulse (bpm): 66 Weight (lbs): 201 Respiratory Rate (breaths/min): 16 Body Mass Index (BMI): 32.4 Blood Pressure (mmHg): 177/69 Reference Range: 80 - 120 mg / dl Electronic Signature(s) Signed: 07/26/2019 5:14:53 PM By: Gretta Cool, [name], RN, [name], [name] RN, [name] Entered By: Gretta Cool, [name], RN, [name], [name] on 07/26/2019 12:51:50

## 2019-08-02 ENCOUNTER — Other Ambulatory Visit: Payer: Self-pay

## 2019-08-02 ENCOUNTER — Encounter: Payer: Medicare Other | Admitting: Physician Assistant

## 2019-08-02 DIAGNOSIS — L03116 Cellulitis of left lower limb: Secondary | ICD-10-CM | POA: Diagnosis not present

## 2019-08-02 NOTE — Progress Notes (Addendum)
[name], [name] ([id number]) Visit Report for 08/02/2019 Chief Complaint Document Details Patient Name: [name], [name] Date of Service: 08/02/2019 10:45 AM Medical Record Number: [id number] Patient Account Number: [id number] Date of Birth/Sex: [birth year] (79 y.o. F) Treating RN: [name] Primary Care Provider: [name] Other Clinician: Referring Provider: [name] Treating Provider/Extender: [name], Dominion Kathan Weeks in Treatment: 8 Information Obtained from: Patient Chief Complaint Left posterior LE ulcer Electronic Signature(s) Signed: 08/02/2019 11:31:53 AM By: Lenda Kelp PA-C Entered By: Lenda Kelp on 08/02/2019 11:31:52 Budnick, Clothilde B. ([id number]) -------------------------------------------------------------------------------- Debridement Details Patient Name: Valladares, [name] Date of Service: 08/02/2019 10:45 AM Medical Record Number: [id number] Patient Account Number: [id number] Date of Birth/Sex: 03-Feb-1941 (79 y.o. F) Treating RN: [name] Primary Care Provider: [name] Other Clinician: Referring Provider: [name] Treating Provider/Extender: [name], Choice Kleinsasser Weeks in Treatment: 8 Debridement Performed for Wound #1 Left,Posterior Lower Leg Assessment: Performed By: Physician STONE III, Joandry Slagter E., PA-C Debridement Type: Debridement Level of Consciousness (Pre- Awake and Alert procedure): Pre-procedure Verification/Time Out Yes - 11:46 Taken: Start Time: 11:46 Pain Control: Lidocaine 4% Topical Solution Total Area Debrided (L x W): 1 (cm) x 1.6 (cm) = 1.6 (cm) Tissue and other material debrided: Viable, Non-Viable, Slough, Subcutaneous, Biofilm, Slough Level: Skin/Subcutaneous Tissue Debridement Description: Excisional Instrument: Curette Bleeding: Minimum Hemostasis Achieved: Pressure End Time: 11:48 Procedural Pain: 0 Post Procedural Pain: 0 Response to Treatment: Procedure was tolerated well Level of Consciousness (Post- Awake and  Alert procedure): Post Debridement Measurements of Total Wound Length: (cm) 1 Width: (cm) 1.6 Depth: (cm) 0.2 Volume: (cm) 0.251 Character of Wound/Ulcer Post Debridement: Improved Post Procedure Diagnosis Same as Pre-procedure Electronic Signature(s) Signed: 08/02/2019 4:04:12 PM By: [name] Signed: 08/02/2019 4:19:26 PM By: Lenda Kelp PA-C Entered By: [name] on 08/02/2019 11:46:46 Alexie, Amariah B. ([id number]) -------------------------------------------------------------------------------- HPI Details Patient Name: Bambrick, [name] Date of Service: 08/02/2019 10:45 AM Medical Record Number: [id number] Patient Account Number: [id number] Date of Birth/Sex: 09-Jul-1940 (79 y.o. F) Treating RN: [name] Primary Care Provider: [name] Other Clinician: Referring Provider: [name] Treating Provider/Extender: [name], Lesha Jager Weeks in Treatment: 8 History of Present Illness HPI Description: 06/07/2019 upon evaluation today patient presents today for initial evaluation in our clinic concerning issues she is having with her left lower extremity. She tells me that she hit her leg on a rocking chair when she was getting up and this was roughly at the beginning of December. Dr. Hyacinth Meeker who is her primary care provider has been helping with treating this area since that time. The patient states unfortunately this just is not getting any better. He is attempted salt water soaks as well as using antibiotic ointments topically unfortunately she has not noted much improvement. He wanted her to come to the wound center sooner but she declined although when things did not seem to be getting better she finally consented to this. Currently the patient does appear to have cellulitis of left lower extremity she also has atrial fibrillation noted for which she is on Eliquis this is probably part of the reason that she developed a large hematoma that she did. Subsequently she also has  hypertension as well as congestive heart failure. The patient does not appear to be febrile today and really is doing fairly well as far as that is concerned although the leg is warm to touch pretty much throughout. No fevers, chills, nausea, vomiting, or diarrhea. She has not been on any oral antibiotics. 06/28/2019 patient  actually is seen today in follow-up for her lower extremity ulcer. She is status post having actually spent 9 days in the hospital following when I sent her over for evaluation. They did have to keep her in the hospital and treat her with IV vancomycin secondary to the fact that she unfortunately had a bacteria present that obviously can be managed with oral medication with her penicillin allergy. Nonetheless her leg appears to be doing tremendously better today. There is no signs of active infection and overall very pleased with how things are progressing. I do believe that the wound is also measuring better along with looking much less irritated overall I think she is going to do quite well at this point. 07/05/2019 on evaluation today patient actually appears to be doing quite well with regard to her wound which is measuring smaller and still appears to be doing tremendously better compared to prior evaluation. I do believe the Annitta Needs has been of benefit for her. 07/12/19 patient's wound actually appears to be doing excellent at this time in regard to the left posterior lower extremity. She has been tolerating the dressing changes without complication. Fortunately there is much improvement noted here there is some slough noted she has been using Santyl I think that still appropriate. With that being said I am going to go ahead and perform sharp debridement today to clear away some of the necrotic material as well. 07/19/2019 upon evaluation today patient actually appears to be doing quite well in regard to her leg ulcer. She has been using Santyl and has been doing excellent with  this up to this point. However I think based on what I am seeing today we can likely proceed to doing something else, collagen, to help with getting this wound to heal more effectively and quickly. 07/26/2019 upon evaluation today patient appears to be doing excellent in regard to her lower extremity ulcer. She has been tolerating the dressing changes without complication. Fortunately the wound is continuing to measure smaller and does seem to be progressing quite nicely. I am very pleased with how things are going at this point. The patient likewise is extremely happy. 08/02/2019 upon evaluation today patient appears to be healing quite nicely and is making excellent progress at this point. Fortunately there is no evidence of active infection at this time which is good news. No fever chills noted Electronic Signature(s) Signed: 08/02/2019 1:26:43 PM By: Worthy Keeler PA-C Entered By: Worthy Keeler on 08/02/2019 13:26:43 Clopper, Ashling B. ([id number]) -------------------------------------------------------------------------------- Physical Exam Details Patient Name: Spano, Caraline B. Date of Service: 08/02/2019 10:45 AM Medical Record Number: [id number] Patient Account Number: [id number] Date of Birth/Sex: 05/07/1941 (79 y.o. F) Treating RN: Montey Hora Primary Care Provider: Emily Filbert Other Clinician: Referring Provider: Emily Filbert Treating Provider/Extender: Melburn Hake, Rakiyah Esch Weeks in Treatment: 8 Constitutional Well-nourished and well-hydrated in no acute distress. Respiratory normal breathing without difficulty. Psychiatric this patient is able to make decisions and demonstrates good insight into disease process. Alert and Oriented x 3. pleasant and cooperative. Notes Patient's wound actually is showing signs of excellent improvement in fact is measuring significantly smaller compared to the last evaluation. There is no evidence of active infection at this time which is great news. No  fevers, chills, nausea, vomiting, or diarrhea. Electronic Signature(s) Signed: 08/02/2019 1:26:57 PM By: Worthy Keeler PA-C Entered By: Worthy Keeler on 08/02/2019 13:26:57 Docken, Lindalee B. ([id number]) -------------------------------------------------------------------------------- Physician Orders Details Patient Name: Yeargan, Tache B. Date of Service: 08/02/2019  10:45 AM Medical Record Number: [id number] Patient Account Number: [id number] Date of Birth/Sex: [birth year] (79 y.o. F) Treating RN: [name] Primary Care Provider: [name] Other Clinician: Referring Provider: [name] Treating Provider/Extender: [name], Hadyn Blanck Weeks in Treatment: 8 Verbal / Phone Orders: No Diagnosis Coding ICD-10 Coding Code Description [phone] Unspecified open wound, left lower leg, initial encounter L97.828 Non-pressure chronic ulcer of other part of left lower leg with other specified severity L03.116 Cellulitis of left lower limb I48.0 Paroxysmal atrial fibrillation I10 Essential (primary) hypertension I50.42 Chronic combined systolic (congestive) and diastolic (congestive) heart failure Z79.01 Long term (current) use of anticoagulants Wound Cleansing Wound #1 Left,Posterior Lower Leg o Clean wound with Normal Saline. o May shower with protection. Anesthetic (add to Medication List) Wound #1 Left,Posterior Lower Leg o Topical Lidocaine 4% cream applied to wound bed prior to debridement (In Clinic Only). Primary Wound Dressing Wound #1 Left,Posterior Lower Leg o Silver Collagen - moisten with hydrogel Secondary Dressing Wound #1 Left,Posterior Lower Leg o Boardered Foam Dressing Dressing Change Frequency Wound #1 Left,Posterior Lower Leg o Change dressing every other day. Follow-up Appointments Wound #1 Left,Posterior Lower Leg o Return Appointment in 1 week. Edema Control Wound #1 Left,Posterior Lower Leg o Elevate legs to the level of the heart and pump  ankles as often as possible o Other: - TubiGrip F Home Health Wound #1 Left,Posterior Lower Leg o Continue Home Health Visits o Home Health Nurse may visit PRN to address patientos wound care needs. o FACE TO FACE ENCOUNTER: MEDICARE and MEDICAID PATIENTS: I certify that this patient is under my care and that I had a face-to- face encounter that meets the physician face-to-face encounter requirements with this patient on this date. The encounter with the patient was in whole or in part for the following MEDICAL CONDITION: (primary reason for Home Healthcare) MEDICAL NECESSITY: I certify, that based on my findings, NURSING services are a medically necessary home health service. HOME BOUND STATUS: I Banfield, Braelynne B. ([id number]) certify that my clinical findings support that this patient is homebound (i.e., Due to illness or injury, pt requires aid of supportive devices such as crutches, cane, wheelchairs, walkers, the use of special transportation or the assistance of another person to leave their place of residence. There is a normal inability to leave the home and doing so requires considerable and taxing effort. Other absences are for medical reasons / religious services and are infrequent or of short duration when for other reasons). o If current dressing causes regression in wound condition, may D/C ordered dressing product/s and apply Normal Saline Moist Dressing daily until next Wound Healing Center / Other MD appointment. Notify Wound Healing Center of regression in wound condition at [phone]. o Please direct any NON-WOUND related issues/requests for orders to patient's Primary Care Physician Electronic Signature(s) Signed: 08/02/2019 4:04:12 PM By: [name] Signed: 08/02/2019 4:19:26 PM By: Lenda Kelp PA-C Entered By: [name] on 08/02/2019 11:47:47 Violet, Amarria B.  ([id number]) -------------------------------------------------------------------------------- Problem List Details Patient Name: Vassey, Phaedra B. Date of Service: 08/02/2019 10:45 AM Medical Record Number: [id number] Patient Account Number: [id number] Date of Birth/Sex: May 01, 1941 (79 y.o. F) Treating RN: [name] Primary Care Provider: [name] Other Clinician: Referring Provider: [name] Treating Provider/Extender: [name], Kirsti Mcalpine Weeks in Treatment: 8 Active Problems ICD-10 Evaluated Encounter Code Description Active Date Today Diagnosis S81.802A Unspecified open wound, left lower leg, initial encounter 06/07/2019 No Yes L97.828 Non-pressure chronic ulcer of other part of left lower leg with  other 06/07/2019 No Yes specified severity L03.116 Cellulitis of left lower limb 06/07/2019 No Yes I48.0 Paroxysmal atrial fibrillation 06/07/2019 No Yes I10 Essential (primary) hypertension 06/07/2019 No Yes I50.42 Chronic combined systolic (congestive) and diastolic (congestive) heart 06/07/2019 No Yes failure Z79.01 Long term (current) use of anticoagulants 06/07/2019 No Yes Inactive Problems Resolved Problems Electronic Signature(s) Signed: 08/02/2019 11:15:19 AM By: Lenda Kelp PA-C Entered By: Lenda Kelp on 08/02/2019 11:15:19 Riemann, Zoie B. ([id number]) -------------------------------------------------------------------------------- Progress Note Details Patient Name: Carrigan, Jazmyn B. Date of Service: 08/02/2019 10:45 AM Medical Record Number: [id number] Patient Account Number: [id number] Date of Birth/Sex: 1941/02/20 (79 y.o. F) Treating RN: [name] Primary Care Provider: [name] Other Clinician: Referring Provider: [name] Treating Provider/Extender: [name], Destina Mantei Weeks in Treatment: 8 Subjective Chief Complaint Information obtained from Patient Left posterior LE ulcer History of Present Illness (HPI) 06/07/2019 upon evaluation today patient presents  today for initial evaluation in our clinic concerning issues she is having with her left lower extremity. She tells me that she hit her leg on a rocking chair when she was getting up and this was roughly at the beginning of December. Dr. Hyacinth Meeker who is her primary care provider has been helping with treating this area since that time. The patient states unfortunately this just is not getting any better. He is attempted salt water soaks as well as using antibiotic ointments topically unfortunately she has not noted much improvement. He wanted her to come to the wound center sooner but she declined although when things did not seem to be getting better she finally consented to this. Currently the patient does appear to have cellulitis of left lower extremity she also has atrial fibrillation noted for which she is on Eliquis this is probably part of the reason that she developed a large hematoma that she did. Subsequently she also has hypertension as well as congestive heart failure. The patient does not appear to be febrile today and really is doing fairly well as far as that is concerned although the leg is warm to touch pretty much throughout. No fevers, chills, nausea, vomiting, or diarrhea. She has not been on any oral antibiotics. 06/28/2019 patient actually is seen today in follow-up for her lower extremity ulcer. She is status post having actually spent 9 days in the hospital following when I sent her over for evaluation. They did have to keep her in the hospital and treat her with IV vancomycin secondary to the fact that she unfortunately had a bacteria present that obviously can be managed with oral medication with her penicillin allergy. Nonetheless her leg appears to be doing tremendously better today. There is no signs of active infection and overall very pleased with how things are progressing. I do believe that the wound is also measuring better along with looking much less irritated overall I  think she is going to do quite well at this point. 07/05/2019 on evaluation today patient actually appears to be doing quite well with regard to her wound which is measuring smaller and still appears to be doing tremendously better compared to prior evaluation. I do believe the Melburn Popper has been of benefit for her. 07/12/19 patient's wound actually appears to be doing excellent at this time in regard to the left posterior lower extremity. She has been tolerating the dressing changes without complication. Fortunately there is much improvement noted here there is some slough noted she has been using Santyl I think that still appropriate. With that being said I  am going to go ahead and perform sharp debridement today to clear away some of the necrotic material as well. 07/19/2019 upon evaluation today patient actually appears to be doing quite well in regard to her leg ulcer. She has been using Santyl and has been doing excellent with this up to this point. However I think based on what I am seeing today we can likely proceed to doing something else, collagen, to help with getting this wound to heal more effectively and quickly. 07/26/2019 upon evaluation today patient appears to be doing excellent in regard to her lower extremity ulcer. She has been tolerating the dressing changes without complication. Fortunately the wound is continuing to measure smaller and does seem to be progressing quite nicely. I am very pleased with how things are going at this point. The patient likewise is extremely happy. 08/02/2019 upon evaluation today patient appears to be healing quite nicely and is making excellent progress at this point. Fortunately there is no evidence of active infection at this time which is good news. No fever chills noted Objective Constitutional Well-nourished and well-hydrated in no acute distress. Vitals Time Taken: 11:12 AM, Height: 66 in, Weight: 201 lbs, BMI: 32.4, Temperature: 97.6 F, Pulse:  95 bpm, Respiratory Rate: 18 breaths/min, Blood Pressure: 149/69 mmHg. Glowacki, Toniya B. ([id number]) Respiratory normal breathing without difficulty. Psychiatric this patient is able to make decisions and demonstrates good insight into disease process. Alert and Oriented x 3. pleasant and cooperative. General Notes: Patient's wound actually is showing signs of excellent improvement in fact is measuring significantly smaller compared to the last evaluation. There is no evidence of active infection at this time which is great news. No fevers, chills, nausea, vomiting, or diarrhea. Integumentary (Hair, Skin) Wound #1 status is Open. Original cause of wound was Trauma. The wound is located on the Left,Posterior Lower Leg. The wound measures 1cm length x 1.6cm width x 0.1cm depth; 1.257cm^2 area and 0.126cm^3 volume. There is Fat Layer (Subcutaneous Tissue) Exposed exposed. There is a large amount of serous drainage noted. The wound margin is flat and intact. There is medium (34-66%) granulation within the wound bed. There is a medium (34-66%) amount of necrotic tissue within the wound bed including Adherent Slough. Assessment Active Problems ICD-10 Unspecified open wound, left lower leg, initial encounter Non-pressure chronic ulcer of other part of left lower leg with other specified severity Cellulitis of left lower limb Paroxysmal atrial fibrillation Essential (primary) hypertension Chronic combined systolic (congestive) and diastolic (congestive) heart failure Long term (current) use of anticoagulants Procedures Wound #1 Pre-procedure diagnosis of Wound #1 is a Trauma, Other located on the Left,Posterior Lower Leg . There was a Excisional Skin/Subcutaneous Tissue Debridement with a total area of 1.6 sq cm performed by STONE III, Dessire Grimes E., PA-C. With the following instrument(s): Curette to remove Viable and Non-Viable tissue/material. Material removed includes Subcutaneous Tissue, Slough, and  Biofilm after achieving pain control using Lidocaine 4% Topical Solution. No specimens were taken. A time out was conducted at 11:46, prior to the start of the procedure. A Minimum amount of bleeding was controlled with Pressure. The procedure was tolerated well with a pain level of 0 throughout and a pain level of 0 following the procedure. Post Debridement Measurements: 1cm length x 1.6cm width x 0.2cm depth; 0.251cm^3 volume. Character of Wound/Ulcer Post Debridement is improved. Post procedure Diagnosis Wound #1: Same as Pre-Procedure Plan Wound Cleansing: Wound #1 Left,Posterior Lower Leg: Clean wound with Normal Saline. May shower with protection. Anesthetic (add  to Medication List): Wound #1 Left,Posterior Lower Leg: Topical Lidocaine 4% cream applied to wound bed prior to debridement (In Clinic Only). Primary Wound Dressing: Wound #1 Left,Posterior Lower Leg: Silver Collagen - moisten with hydrogel Secondary Dressing: Wound #1 Left,Posterior Lower Leg: Boardered Foam Dressing Witzke, Tatelyn B. ([id number]) Dressing Change Frequency: Wound #1 Left,Posterior Lower Leg: Change dressing every other day. Follow-up Appointments: Wound #1 Left,Posterior Lower Leg: Return Appointment in 1 week. Edema Control: Wound #1 Left,Posterior Lower Leg: Elevate legs to the level of the heart and pump ankles as often as possible Other: - TubiGrip F Home Health: Wound #1 Left,Posterior Lower Leg: Continue Home Health Visits Home Health Nurse may visit PRN to address patient s wound care needs. FACE TO FACE ENCOUNTER: MEDICARE and MEDICAID PATIENTS: I certify that this patient is under my care and that I had a face-to-face encounter that meets the physician face-to-face encounter requirements with this patient on this date. The encounter with the patient was in whole or in part for the following MEDICAL CONDITION: (primary reason for Home Healthcare) MEDICAL NECESSITY: I certify, that based on my  findings, NURSING services are a medically necessary home health service. HOME BOUND STATUS: I certify that my clinical findings support that this patient is homebound (i.e., Due to illness or injury, pt requires aid of supportive devices such as crutches, cane, wheelchairs, walkers, the use of special transportation or the assistance of another person to leave their place of residence. There is a normal inability to leave the home and doing so requires considerable and taxing effort. Other absences are for medical reasons / religious services and are infrequent or of short duration when for other reasons). If current dressing causes regression in wound condition, may D/C ordered dressing product/s and apply Normal Saline Moist Dressing daily until next Wound Healing Center / Other MD appointment. Notify Wound Healing Center of regression in wound condition at [phone]. Please direct any NON-WOUND related issues/requests for orders to patient's Primary Care Physician 1. I would recommend at this point that we go ahead and initiate a continuation of the current wound care measures utilizing the silver collagen dressing moistened with hydrogel followed by border foam dressing over the next week. The patient is in agreement with plan. 2. I am also can recommend at this time that we continue with a Tubigrip wrap over top in order to help with edema control this is done very well for her. 3. I am also recommend patient continue to monitor for any signs of infection right now she seems to be doing quite well I do not see any issues but nonetheless this is something that we need to keep a close eye on continually going forward. We will see patient back for reevaluation in 1 week here in the clinic. If anything worsens or changes patient will contact our office for additional recommendations. Electronic Signature(s) Signed: 08/02/2019 1:28:00 PM By: Lenda Kelp PA-C Entered By: Lenda Kelp on  08/02/2019 13:27:59 Talent, Ciji B. ([id number]) -------------------------------------------------------------------------------- SuperBill Details Patient Name: Conde, Jalaila B. Date of Service: 08/02/2019 Medical Record Number: [id number] Patient Account Number: [id number] Date of Birth/Sex: [birth year] (79 y.o. 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 Long term (current) use of anticoagulants Facility Procedures CPT4 Code: [id number] Description: 11042 - DEB SUBQ TISSUE 20 SQ CM/< Modifier: Quantity: 1 CPT4 Code: Description: ICD-10 Diagnosis Description 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

## 2019-08-06 NOTE — Progress Notes (Signed)
[name], [name] ([id number]) Visit Report for 08/02/2019 Arrival Information Details Patient Name: [name], [name] Date of Service: 08/02/2019 10:45 AM Medical Record Number: [id number] Patient Account Number: [id number] Date of Birth/Sex: [birth year] (79 y.o. F) Treating RN: [name] Primary Care Huxley Shurley: [name] Other Clinician: Referring Jemell Town: [name] Treating Jearlene Bridwell/Extender: [name], [name] Weeks in Treatment: 8 Visit Information History Since Last Visit Added or deleted any medications: Yes Patient Arrived: Walker Any new allergies or adverse reactions: No Arrival Time: 11:14 Had a fall or experienced change in No Accompanied By: friend activities of daily living that may affect Transfer Assistance: None risk of falls: Patient Identification Verified: Yes Signs or symptoms of abuse/neglect since last visito No Secondary Verification Process Completed: Yes Hospitalized since last visit: No Patient Has Alerts: Yes Implantable device outside of the clinic excluding No Patient Alerts: 06/11/19 L TBI 0.74 cellular tissue based products placed in the center since last visit: Has Dressing in Place as Prescribed: Yes Pain Present Now: No Electronic Signature(s) Signed: 08/02/2019 4:02:09 PM By: Dayton Martes RCP, RRT, CHT Entered By: Dayton Martes on 08/02/2019 11:16:12 [name], [name] ([id number]) -------------------------------------------------------------------------------- Encounter Discharge Information Details Patient Name: [name], [name] Date of Service: 08/02/2019 10:45 AM Medical Record Number: [id number] Patient Account Number: [id number] Date of Birth/Sex: 19-Apr-1941 (79 y.o. F) Treating RN: [name] Primary Care Ladora Osterberg: [name] Other Clinician: Referring Malayasia Mirkin: [name] Treating Sheneika Walstad/Extender: [name], [name] Weeks in Treatment: 8 Encounter Discharge Information Items Post Procedure Vitals Discharge  Condition: Stable Temperature (F): 97.6 Ambulatory Status: Walker Pulse (bpm): 95 Discharge Destination: Home Respiratory Rate (breaths/min): 16 Transportation: Private Auto Blood Pressure (mmHg): 149/69 Accompanied By: caregiver Schedule Follow-up Appointment: Yes Clinical Summary of Care: Electronic Signature(s) Signed: 08/02/2019 4:04:12 PM By: [name] Entered By: [name] on 08/02/2019 11:49:28 [name], [name] ([id number]) -------------------------------------------------------------------------------- Lower Extremity Assessment Details Patient Name: [name], [name] Date of Service: 08/02/2019 10:45 AM Medical Record Number: [id number] Patient Account Number: [id number] Date of Birth/Sex: Apr 14, 1941 (79 y.o. F) Treating RN: Rodell Perna Primary Care Rozann Holts: [name] Other Clinician: Referring Shayaan Parke: [name] Treating Shakisha Abend/Extender: STONE III, [name] Weeks in Treatment: 8 Edema Assessment Assessed: [Left: No] [Right: No] Edema: [Left: N] [Right: o] Vascular Assessment Pulses: Dorsalis Pedis Palpable: [Left:Yes] Electronic Signature(s) Signed: 08/06/2019 10:13:19 AM By: Rodell Perna Entered By: Rodell Perna on 08/02/2019 11:26:31 [name], [name] ([id number]) -------------------------------------------------------------------------------- Multi Wound Chart Details Patient Name: [name], [name] Date of Service: 08/02/2019 10:45 AM Medical Record Number: [id number] Patient Account Number: [id number] Date of Birth/Sex: 10-19-1940 (79 y.o. F) Treating RN: [name] Primary Care Anali Cabanilla: [name] Other Clinician: Referring Carmon Brigandi: [name] Treating Maks Cavallero/Extender: [name], [name] Weeks in Treatment: 8 Vital Signs Height(in): 66 Pulse(bpm): 95 Weight(lbs): 201 Blood Pressure(mmHg): 149/69 Body Mass Index(BMI): 32 Temperature(F): 97.6 Respiratory Rate(breaths/min): 18 Photos: [N/A:N/A] Wound Location: Left Lower Leg - Posterior N/A  N/A Wounding Event: Trauma N/A N/A Primary Etiology: Trauma, Other N/A N/A Comorbid History: Cataracts, Arrhythmia, Congestive N/A N/A Heart Failure, Hypertension, Gout Date Acquired: 04/23/2019 N/A N/A Weeks of Treatment: 8 N/A N/A Wound Status: Open N/A N/A Measurements L x W x D (cm) 1x1.6x0.1 N/A N/A Area (cm) : 1.257 N/A N/A Volume (cm) : 0.126 N/A N/A % Reduction in Area: 91.30% N/A N/A % Reduction in Volume: 91.30% N/A N/A Classification: Full Thickness Without Exposed N/A N/A Support Structures Exudate Amount: Large N/A N/A Exudate Type: Serous N/A N/A Exudate Color: amber N/A N/A Wound Margin: Flat  and Intact N/A N/A Granulation Amount: Medium (34-66%) N/A N/A Necrotic Amount: Medium (34-66%) N/A N/A Exposed Structures: Fat Layer (Subcutaneous Tissue) N/A N/A Exposed: Yes Fascia: No Tendon: No Muscle: No Joint: No Bone: No Epithelialization: Medium (34-66%) N/A N/A Treatment Notes Electronic Signature(s) Signed: 08/02/2019 4:04:12 PM By: [name] Entered By: [name] on 08/02/2019 11:45:30 [name], [name] ([id number]) [name], [name] ([id number]) -------------------------------------------------------------------------------- Multi-Disciplinary Care Plan Details Patient Name: [name], [name] Date of Service: 08/02/2019 10:45 AM Medical Record Number: [id number] Patient Account Number: [id number] Date of Birth/Sex: 1941-01-02 (79 y.o. F) Treating RN: [name] Primary Care Ardon Franklin: [name] Other Clinician: Referring Gracey Tolle: [name] Treating Lannis Lichtenwalner/Extender: [name], [name] Weeks in Treatment: 8 Active Inactive Abuse / Safety / Falls / Self Care Management Nursing Diagnoses: Potential for falls Goals: Patient will remain injury free related to falls Date Initiated: 06/07/2019 Target Resolution Date: 08/31/2019 Goal Status: Active Interventions: Assess fall risk on admission and as needed Notes: Necrotic Tissue Nursing  Diagnoses: Impaired tissue integrity related to necrotic/devitalized tissue Goals: Necrotic/devitalized tissue will be minimized in the wound bed Date Initiated: 06/07/2019 Target Resolution Date: 08/31/2019 Goal Status: Active Interventions: Provide education on necrotic tissue and debridement process Notes: Orientation to the Wound Care Program Nursing Diagnoses: Knowledge deficit related to the wound healing center program Goals: Patient/caregiver will verbalize understanding of the Wound Healing Center Program Date Initiated: 06/07/2019 Target Resolution Date: 08/31/2019 Goal Status: Active Interventions: Provide education on orientation to the wound center Notes: Pain, Acute or Chronic Nursing Diagnoses: Pain, acute or chronic: actual or potential [name], [name] ([id number]) Goals: Patient/caregiver will verbalize adequate pain control between visits Date Initiated: 06/07/2019 Target Resolution Date: 08/31/2019 Goal Status: Active Interventions: Complete pain assessment as per visit requirements Notes: Wound/Skin Impairment Nursing Diagnoses: Impaired tissue integrity Goals: Ulcer/skin breakdown will heal within 14 weeks Date Initiated: 06/07/2019 Target Resolution Date: 08/31/2019 Goal Status: Active Interventions: Assess patient/caregiver ability to obtain necessary supplies Assess patient/caregiver ability to perform ulcer/skin care regimen upon admission and as needed Assess ulceration(s) every visit Notes: Electronic Signature(s) Signed: 08/02/2019 4:04:12 PM By: [name] Entered By: [name] on 08/02/2019 11:45:19 [name], [name] ([id number]) -------------------------------------------------------------------------------- Pain Assessment Details Patient Name: [name], [name] Date of Service: 08/02/2019 10:45 AM Medical Record Number: [id number] Patient Account Number: [id number] Date of Birth/Sex: 12/12/1940 (79 y.o. F) Treating RN: Rodell Perna Primary  Care Chasty Randal: [name] Other Clinician: Referring Jasia Hiltunen: [name] Treating Bobby Barton/Extender: [name], [name] Weeks in Treatment: 8 Active Problems Location of Pain Severity and Description of Pain Patient Has Paino No Site Locations Pain Management and Medication Current Pain Management: Electronic Signature(s) Signed: 08/06/2019 10:13:19 AM By: Rodell Perna Entered By: Rodell Perna on 08/02/2019 11:24:18 [name], [name] ([id number]) -------------------------------------------------------------------------------- Patient/Caregiver Education Details Patient Name: Bratz, Santanna B. Date of Service: 08/02/2019 10:45 AM Medical Record Number: [id number] Patient Account Number: [id number] Date of Birth/Gender: 09-May-1941 (79 y.o. F) Treating RN: [name] Primary Care Physician: [name] Other Clinician: Referring Physician: [name] Treating Physician/Extender: Skeet Simmer in Treatment: 8 Education Assessment Education Provided To: Patient and Caregiver Education Topics Provided Wound/Skin Impairment: Handouts: Other: wound care asordered Methods: Demonstration, Explain/Verbal Responses: State content correctly Electronic Signature(s) Signed: 08/02/2019 4:04:12 PM By: [name] Entered By: [name] on 08/02/2019 11:48:24 [name], [name] ([id number]) -------------------------------------------------------------------------------- Wound Assessment Details Patient Name: [name], [name] Date of Service: 08/02/2019 10:45 AM Medical Record Number: [id number] Patient Account Number: [id number] Date of Birth/Sex: [birth year] (79 y.o. F)  Treating RN: Army Melia Primary Care [name]: Emily Filbert Other Clinician: Referring Nickolai Rinks: Emily Filbert Treating Jazminn Pomales/Extender: Melburn Hake, [name] Weeks in Treatment: 8 Wound Status Wound Number: 1 Primary Trauma, Other Etiology: Wound Location: Left Lower Leg - Posterior Wound Status: Open Wounding Event:  Trauma Comorbid Cataracts, Arrhythmia, Congestive Heart Failure, Date Acquired: 04/23/2019 History: Hypertension, Gout Weeks Of Treatment: 8 Clustered Wound: No Photos Wound Measurements Length: (cm) 1 Width: (cm) 1.6 Depth: (cm) 0.1 Area: (cm) 1.257 Volume: (cm) 0.126 % Reduction in Area: 91.3% % Reduction in Volume: 91.3% Epithelialization: Medium (34-66%) Wound Description Classification: Full Thickness Without Exposed Support Structu Wound Margin: Flat and Intact Exudate Amount: Large Exudate Type: Serous Exudate Color: amber res Foul Odor After Cleansing: No Slough/Fibrino Yes Wound Bed Granulation Amount: Medium (34-66%) Exposed Structure Necrotic Amount: Medium (34-66%) Fascia Exposed: No Necrotic Quality: Adherent Slough Fat Layer (Subcutaneous Tissue) Exposed: Yes Tendon Exposed: No Muscle Exposed: No Joint Exposed: No Bone Exposed: No Treatment Notes Wound #1 (Left, Posterior Lower Leg) Notes prisma, BFD with tubi F Electronic Signature(s) Signed: 08/06/2019 10:13:19 AM By: Fleeta Emmer, Natally B. ([id number]) Entered By: Army Melia on 08/02/2019 11:25:34 Wendland, Paula B. ([id number]) -------------------------------------------------------------------------------- Vitals Details Patient Name: [name], Imunique B. Date of Service: 08/02/2019 10:45 AM Medical Record Number: [id number] Patient Account Number: [id number] Date of Birth/Sex: [birth year] (79 y.o. F) Treating RN: Montey Hora Primary Care Malashia Kamaka: Emily Filbert Other Clinician: Referring Artesha Wemhoff: Emily Filbert Treating Braelin Brosch/Extender: Melburn Hake, [name] Weeks in Treatment: 8 Vital Signs Time Taken: 11:12 Temperature (F): 97.6 Height (in): 66 Pulse (bpm): 95 Weight (lbs): 201 Respiratory Rate (breaths/min): 18 Body Mass Index (BMI): 32.4 Blood Pressure (mmHg): 149/69 Reference Range: 80 - 120 mg / dl Electronic Signature(s) Signed: 08/02/2019 4:02:09 PM By: Lorine Bears RCP,  RRT, CHT Entered By: Lorine Bears on 08/02/2019 11:17:00

## 2019-08-09 ENCOUNTER — Encounter: Payer: Medicare Other | Admitting: Physician Assistant

## 2019-08-09 ENCOUNTER — Other Ambulatory Visit: Payer: Self-pay

## 2019-08-09 DIAGNOSIS — L03116 Cellulitis of left lower limb: Secondary | ICD-10-CM | POA: Diagnosis not present

## 2019-08-09 NOTE — Progress Notes (Signed)
Pech, Lagina B. ([id number]) Visit Report for 08/09/2019 Arrival Information Details Patient Name: Franson, Akeira B. Date of Service: 08/09/2019 2:00 PM Medical Record Number: [id number] Patient Account Number: [id number] Date of Birth/Sex: 1940/07/26 (79 y.o. F) Treating RN: [name] Primary Care Estell Dillinger: [name] Other Clinician: Referring Anelle Parlow: [name] Treating Felina Tello/Extender: [name], [name] Weeks in Treatment: 9 Visit Information History Since Last Visit Has Dressing in Place as Prescribed: Yes Patient Arrived: Walker Pain Present Now: No Arrival Time: 14:23 Accompanied By: friend Transfer Assistance: None Patient Identification Verified: Yes Secondary Verification Process Completed: Yes Patient Has Alerts: Yes Patient Alerts: 06/11/19 L TBI 0.74 Electronic Signature(s) Signed: 08/09/2019 5:21:44 PM By: [name], [name], RN, [name], [name] RN, [name] Entered By: [name], [name], RN, [name], [name] on 08/09/2019 14:24:12 Reczek, Wilma B. ([id number]) -------------------------------------------------------------------------------- Clinic Level of Care Assessment Details Patient Name: Colpitts, Katalyna B. Date of Service: 08/09/2019 2:00 PM Medical Record Number: [id number] Patient Account Number: [id number] Date of Birth/Sex: [birth year] (79 y.o. F) Treating RN: [name] Primary Care Tessica Cupo: [name] Other Clinician: Referring Shirleyann Montero: [name] Treating Thi Sisemore/Extender: [name], [name] Weeks in Treatment: 9 Clinic Level of Care Assessment Items TOOL 4 Quantity Score []  - Use when only an EandM is performed on FOLLOW-UP visit 0 ASSESSMENTS - Nursing Assessment / Reassessment X - Reassessment of Co-morbidities (includes updates in patient status) 1 10 X- 1 5 Reassessment of Adherence to Treatment Plan ASSESSMENTS - Wound and Skin Assessment / Reassessment X - Simple Wound Assessment / Reassessment - one wound 1 5 []  - 0 Complex Wound Assessment / Reassessment - multiple wounds []  -  0 Dermatologic / Skin Assessment (not related to wound area) ASSESSMENTS - Focused Assessment []  - Circumferential Edema Measurements - multi extremities 0 []  - 0 Nutritional Assessment / Counseling / Intervention X- 1 5 Lower Extremity Assessment (monofilament, tuning fork, pulses) []  - 0 Peripheral Arterial Disease Assessment (using hand held doppler) ASSESSMENTS - Ostomy and/or Continence Assessment and Care []  - Incontinence Assessment and Management 0 []  - 0 Ostomy Care Assessment and Management (repouching, etc.) PROCESS - Coordination of Care X - Simple Patient / Family Education for ongoing care 1 15 []  - 0 Complex (extensive) Patient / Family Education for ongoing care X- 1 10 Staff obtains , Records, Test Results / Process Orders []  - 0 Staff telephones HHA, Nursing Homes / Clarify orders / etc []  - 0 Routine Transfer to another Facility (non-emergent condition) []  - 0 Routine Hospital Admission (non-emergent condition) []  - 0 New Admissions / / Ordering NPWT, Apligraf, etc. []  - 0 Emergency Hospital Admission (emergent condition) X- 1 10 Simple Discharge Coordination []  - 0 Complex (extensive) Discharge Coordination PROCESS - Special Needs []  - Pediatric / Minor Patient Management 0 []  - 0 Isolation Patient Management []  - 0 Hearing / Language / Visual special needs []  - 0 Assessment of Community assistance (transportation, D/C planning, etc.) Dorton, Mahogani B. ( ) []  - 0 Additional assistance / Altered mentation []  - 0 Support Surface(s) Assessment (bed, cushion, seat, etc.) INTERVENTIONS - Wound Cleansing / Measurement X - Simple Wound Cleansing - one wound 1 5 []  - 0 Complex Wound Cleansing - multiple wounds X- 1 5 Wound Imaging (photographs - any number of wounds) []  - 0 Wound Tracing (instead of photographs) X- 1 5 Simple Wound Measurement - one wound []  - 0 Complex Wound Measurement - multiple  wounds INTERVENTIONS - Wound Dressings X - Small Wound Dressing one or multiple wounds 1  10 []  - 0 Medium Wound Dressing one or multiple wounds []  - 0 Large Wound Dressing one or multiple wounds []  - 0 Application of Medications - topical []  - 0 Application of Medications - injection INTERVENTIONS - Miscellaneous []  - External ear exam 0 []  - 0 Specimen Collection (cultures, biopsies, blood, body fluids, etc.) []  - 0 Specimen(s) / Culture(s) sent or taken to Lab for analysis []  - 0 Patient Transfer (multiple staff / / Similar devices) []  - 0 Simple Staple / Suture removal (25 or less) []  - 0 Complex Staple / Suture removal (26 or more) []  - 0 Hypo / Hyperglycemic Management (close monitor of Blood Glucose) []  - 0 Ankle / Brachial Index (ABI) - do not check if billed separately X- 1 5 Vital Signs Has the patient been seen at the hospital within the last three years: Yes Total Score: 90 Level Of Care: New/Established - Level 3 Electronic Signature(s) Signed: 08/09/2019 4:39:31 PM By: Entered By: on 08/09/2019 14:41:25 Hersman, Ruthanna B. ( ) -------------------------------------------------------------------------------- Encounter Discharge Information Details Patient Name: Garside, Margree B. Date of Service: 08/09/2019 2:00 PM Medical Record Number: Patient Account Number: Nurse, adult Date of Birth/Sex: 28-Apr-1941 (79 y.o. F) Treating RN: Primary Care Chung Chagoya: Other Clinician: Referring Gibbs Naugle: 08/11/2019 Treating Gennifer Potenza/Extender: [name], [name] Weeks in Treatment: 9 Encounter Discharge Information Items Discharge Condition: Stable Ambulatory Status: Walker Discharge Destination: Home Transportation: Private Auto Accompanied By: caregiver Schedule Follow-up Appointment: Yes Clinical Summary of Care: Electronic Signature(s) Signed: 08/09/2019 4:39:31 PM By: 08/11/2019 Entered By:  [id number] on 08/09/2019 14:42:17 Rezabek, Marni B. ([id number]) -------------------------------------------------------------------------------- Lower Extremity Assessment Details Patient Name: Vohra, Umaima B. Date of Service: 08/09/2019 2:00 PM Medical Record Number: 06/21/1940 Patient Account Number: [id number] Date of Birth/Sex: 1940-05-28 (79 y.o. F) Treating RN: [name] Primary Care Paton Crum: [name] Other Clinician: Referring Reesha Debes: 08/11/2019 Treating Micca Matura/Extender: [name], [name] Weeks in Treatment: 9 Vascular Assessment Pulses: Dorsalis Pedis Palpable: [Left:Yes] Electronic Signature(s) Signed: 08/09/2019 5:21:44 PM By: 08/11/2019, [name], RN, [name], [name] RN, [name] Entered By: [id number], [name], RN, [name], [name] on 08/09/2019 14:25:19 Mcglothlin, Trellis B. ([id number]) -------------------------------------------------------------------------------- Multi Wound Chart Details Patient Name: Burtch, Bracha B. Date of Service: 08/09/2019 2:00 PM Medical Record Number: 06/21/1940 Patient Account Number: [id number] Date of Birth/Sex: [birth year] (79 y.o. F) Treating RN: [name] Primary Care Jaydan Chretien: [name] Other Clinician: Referring Claudell Wohler: 08/11/2019 Treating Paxton Kanaan/Extender: [name], [name] Weeks in Treatment: 9 Vital Signs Height(in): 66 Pulse(bpm): 76 Weight(lbs): 201 Blood Pressure(mmHg): 129/58 Body Mass Index(BMI): 32 Temperature(F): 97.9 Respiratory Rate(breaths/min): Photos: [N/A:N/A] Wound Location: Left Lower Leg - Posterior N/A N/A Wounding Event: Trauma N/A N/A Primary Etiology: Trauma, Other N/A N/A Comorbid History: Cataracts, Arrhythmia, Congestive N/A N/A Heart Failure, Hypertension, Gout Date Acquired: 04/23/2019 N/A N/A Weeks of Treatment: 9 N/A N/A Wound Status: Open N/A N/A Measurements L x W x D (cm) 0.6x0.9x0.1 N/A N/A Area (cm) : 0.424 N/A N/A Volume (cm) : 0.042 N/A N/A % Reduction in Area: 97.10% N/A N/A % Reduction in Volume: 97.10% N/A  N/A Classification: Full Thickness Without Exposed N/A N/A Support Structures Exudate Amount: Large N/A N/A Exudate Type: Serous N/A N/A Exudate Color: amber N/A N/A Wound Margin: Flat and Intact N/A N/A Granulation Amount: Medium (34-66%) N/A N/A Necrotic Amount: Medium (34-66%) N/A N/A Exposed Structures: Fat Layer (Subcutaneous Tissue) N/A N/A Exposed: Yes Fascia: No Tendon: No Muscle: No Joint: No Bone: No Epithelialization: Medium (34-66%)  N/A N/A Treatment Notes Electronic Signature(s) Signed: 08/09/2019 4:39:31 PM By: Montey Hora Entered By: Montey Hora on 08/09/2019 14:39:31 [place], [place] ([id number]) [place], Trenae B. ([id number]) -------------------------------------------------------------------------------- [place] Details Patient Name: Dudzik, Tameah B. Date of Service: 08/09/2019 2:00 PM Medical Record Number: [id number] Patient Account Number: [id number] Date of Birth/Sex: 11-Jun-1940 (79 y.o. F) Treating RN: Cornell Barman Primary Care Karthikeya Funke: Emily Filbert Other Clinician: Referring Haiden Clucas: Emily Filbert Treating Laureano Hetzer/Extender: Melburn Hake, [name] Weeks in Treatment: 9 Active Inactive Abuse / Safety / Falls / Self Care Management Nursing Diagnoses: Potential for falls Goals: Patient will remain injury free related to falls Date Initiated: 06/07/2019 Target Resolution Date: 08/31/2019 Goal Status: Active Interventions: Assess fall risk on admission and as needed Notes: Necrotic Tissue Nursing Diagnoses: Impaired tissue integrity related to necrotic/devitalized tissue Goals: Necrotic/devitalized tissue will be minimized in the wound bed Date Initiated: 06/07/2019 Target Resolution Date: 08/31/2019 Goal Status: Active Interventions: Provide education on necrotic tissue and debridement process Notes: Orientation to the Wound Care Program Nursing Diagnoses: Knowledge deficit related to the wound healing center  program Goals: Patient/caregiver will verbalize understanding of the [facility] Date Initiated: 06/07/2019 Target Resolution Date: 08/31/2019 Goal Status: Active Interventions: Provide education on orientation to the wound center Notes: Pain, Acute or Chronic Nursing Diagnoses: Pain, acute or chronic: actual or potential [place], Jessilynn B. ([id number]) Goals: Patient/caregiver will verbalize adequate pain control between visits Date Initiated: 06/07/2019 Target Resolution Date: 08/31/2019 Goal Status: Active Interventions: Complete pain assessment as per visit requirements Notes: Wound/Skin Impairment Nursing Diagnoses: Impaired tissue integrity Goals: Ulcer/skin breakdown will heal within 14 weeks Date Initiated: 06/07/2019 Target Resolution Date: 08/31/2019 Goal Status: Active Interventions: Assess patient/caregiver ability to obtain necessary supplies Assess patient/caregiver ability to perform ulcer/skin care regimen upon admission and as needed Assess ulceration(s) every visit Notes: Electronic Signature(s) Signed: 08/09/2019 5:21:44 PM By: Gretta Cool, [name], RN, [name], [name] RN, [name] Entered By: Gretta Cool, [name], RN, [name], [name] on 08/09/2019 14:25:24 [place], Denisa B. ([id number]) -------------------------------------------------------------------------------- Pain Assessment Details Patient Name: Gough, Latha B. Date of Service: 08/09/2019 2:00 PM Medical Record Number: [id number] Patient Account Number: [id number] Date of Birth/Sex: June 13, 1940 (79 y.o. F) Treating RN: Cornell Barman Primary Care Irvine Glorioso: Emily Filbert Other Clinician: Referring Nalina Yeatman: Emily Filbert Treating Yliana Gravois/Extender: Melburn Hake, [name] Weeks in Treatment: 9 Active Problems Location of Pain Severity and Description of Pain Patient Has Paino No Site Locations Pain Management and Medication Current Pain Management: Electronic Signature(s) Signed: 08/09/2019 5:21:44 PM By: Gretta Cool, [name], RN, [name], [name] RN,  [name] Entered By: Gretta Cool, [name], RN, [name], [name] on 08/09/2019 14:24:22 [place], Josseline B. ([id number]) -------------------------------------------------------------------------------- Patient/Caregiver Education Details Patient Name: Lentsch, Affie B. Date of Service: 08/09/2019 2:00 PM Medical Record Number: [id number] Patient Account Number: [id number] Date of Birth/Gender: 11-30-1940 (79 y.o. F) Treating RN: Montey Hora Primary Care Physician: Emily Filbert Other Clinician: Referring Physician: Emily Filbert Treating Physician/Extender: Sharalyn Ink in Treatment: 9 Education Assessment Education Provided To: Patient and Caregiver Education Topics Provided Wound/Skin Impairment: Handouts: Other: wound care as ordered Methods: Demonstration, Explain/Verbal Responses: State content correctly Electronic Signature(s) Signed: 08/09/2019 4:39:31 PM By: Montey Hora Entered By: Montey Hora on 08/09/2019 14:41:43 [place], [place]. ([id number]) -------------------------------------------------------------------------------- Wound Assessment Details Patient Name: Bolander, Kelse B. Date of Service: 08/09/2019 2:00 PM Medical Record Number: [id number] Patient Account Number: [id number] Date of Birth/Sex: 1941/05/19 (79 y.o. F) Treating RN: Cornell Barman Primary Care Landy Dunnavant: Emily Filbert Other Clinician: Referring Markeya Mincy: Emily Filbert Treating Pershing Skidmore/Extender: STONE III, [name] Weeks in  Treatment: 9 Wound Status Wound Number: 1 Primary Trauma, Other Etiology: Wound Location: Left Lower Leg - Posterior Wound Status: Open Wounding Event: Trauma Comorbid Cataracts, Arrhythmia, Congestive Heart Failure, Date Acquired: 04/23/2019 History: Hypertension, Gout Weeks Of Treatment: 9 Clustered Wound: No Photos Wound Measurements Length: (cm) 0.6 Width: (cm) 0.9 Depth: (cm) 0.1 Area: (cm) 0.424 Volume: (cm) 0.042 % Reduction in Area: 97.1% % Reduction in Volume: 97.1% Epithelialization: Medium  (34-66%) Wound Description Classification: Full Thickness Without Exposed Support Structu Wound Margin: Flat and Intact Exudate Amount: Large Exudate Type: Serous Exudate Color: amber res Foul Odor After Cleansing: No Slough/Fibrino Yes Wound Bed Granulation Amount: Medium (34-66%) Exposed Structure Necrotic Amount: Medium (34-66%) Fascia Exposed: No Necrotic Quality: Adherent Slough Fat Layer (Subcutaneous Tissue) Exposed: Yes Tendon Exposed: No Muscle Exposed: No Joint Exposed: No Bone Exposed: No Treatment Notes Wound #1 (Left, Posterior Lower Leg) Notes prisma, BFD with tubi F Electronic Signature(s) Signed: 08/09/2019 5:21:44 PM By: [name], [name], RN, [name], [name] RN, [name] Haefner, Oza B. ([id number]) Entered By: [name], [name], RN, [name], [name] on 08/09/2019 14:24:59 [place], Lucelia B. ([id number]) -------------------------------------------------------------------------------- Vitals Details Patient Name: Kalis, Cheryle B. Date of Service: 08/09/2019 2:00 PM Medical Record Number: [id number] Patient Account Number: [id number] Date of Birth/Sex: 05-19-1941 (79 y.o. F) Treating RN: [name] Primary Care Jamilya Sarrazin: [name] Other Clinician: Referring Zuzanna Maroney: [name] Treating Tinea Nobile/Extender: [name], [name] Weeks in Treatment: 9 Vital Signs Time Taken: 14:11 Temperature (F): 97.9 Height (in): 66 Pulse (bpm): 76 Weight (lbs): 201 Blood Pressure (mmHg): 129/58 Body Mass Index (BMI): 32.4 Reference Range: 80 - 120 mg / dl Electronic Signature(s) Signed: 08/09/2019 4:39:31 PM By: [name] Entered By: [name] on 08/09/2019 14:13:07

## 2019-08-09 NOTE — Progress Notes (Addendum)
[name], [name] ([id number]) Visit Report for 08/09/2019 Chief Complaint Document Details Patient Name: [name], [name] Date of Service: 08/09/2019 2:00 PM Medical Record Number: [id number] Patient Account Number: [id number] Date of Birth/Sex: July 27, 1940 (79 y.o. F) Treating RN: [name] Primary Care Provider: [name] Other Clinician: Referring Provider: [name] Treating Provider/Extender: [name], Divine Hansley Weeks in Treatment: 9 Information Obtained from: Patient Chief Complaint Left posterior LE ulcer Electronic Signature(s) Signed: 08/09/2019 2:33:13 PM By: Lenda Kelp PA-C Entered By: Lenda Kelp on 08/09/2019 14:33:12 [name], [name] ([id number]) -------------------------------------------------------------------------------- HPI Details Patient Name: [name], [name] Date of Service: 08/09/2019 2:00 PM Medical Record Number: [id number] Patient Account Number: [id number] Date of Birth/Sex: Jan 16, 1941 (79 y.o. F) Treating RN: [name] Primary Care Provider: [name] Other Clinician: Referring Provider: [name] Treating Provider/Extender: [name], Allayah Raineri Weeks in Treatment: 9 History of Present Illness HPI Description: 06/07/2019 upon evaluation today patient presents today for initial evaluation in our clinic concerning issues she is having with her left lower extremity. She tells me that she hit her leg on a rocking chair when she was getting up and this was roughly at the beginning of December. Dr. Hyacinth Meeker who is her primary care provider has been helping with treating this area since that time. The patient states unfortunately this just is not getting any better. He is attempted salt water soaks as well as using antibiotic ointments topically unfortunately she has not noted much improvement. He wanted her to come to the wound center sooner but she declined although when things did not seem to be getting better she finally consented to this. Currently the  patient does appear to have cellulitis of left lower extremity she also has atrial fibrillation noted for which she is on Eliquis this is probably part of the reason that she developed a large hematoma that she did. Subsequently she also has hypertension as well as congestive heart failure. The patient does not appear to be febrile today and really is doing fairly well as far as that is concerned although the leg is warm to touch pretty much throughout. No fevers, chills, nausea, vomiting, or diarrhea. She has not been on any oral antibiotics. 06/28/2019 patient actually is seen today in follow-up for her lower extremity ulcer. She is status post having actually spent 9 days in the hospital following when I sent her over for evaluation. They did have to keep her in the hospital and treat her with IV vancomycin secondary to the fact that she unfortunately had a bacteria present that obviously can be managed with oral medication with her penicillin allergy. Nonetheless her leg appears to be doing tremendously better today. There is no signs of active infection and overall very pleased with how things are progressing. I do believe that the wound is also measuring better along with looking much less irritated overall I think she is going to do quite well at this point. 07/05/2019 on evaluation today patient actually appears to be doing quite well with regard to her wound which is measuring smaller and still appears to be doing tremendously better compared to prior evaluation. I do believe the Melburn Popper has been of benefit for her. 07/12/19 patient's wound actually appears to be doing excellent at this time in regard to the left posterior lower extremity. She has been tolerating the dressing changes without complication. Fortunately there is much improvement noted here there is some slough noted she has been using Santyl I think that still appropriate. With  that being said I am going to go ahead and perform sharp  debridement today to clear away some of the necrotic material as well. 07/19/2019 upon evaluation today patient actually appears to be doing quite well in regard to her leg ulcer. She has been using Santyl and has been doing excellent with this up to this point. However I think based on what I am seeing today we can likely proceed to doing something else, collagen, to help with getting this wound to heal more effectively and quickly. 07/26/2019 upon evaluation today patient appears to be doing excellent in regard to her lower extremity ulcer. She has been tolerating the dressing changes without complication. Fortunately the wound is continuing to measure smaller and does seem to be progressing quite nicely. I am very pleased with how things are going at this point. The patient likewise is extremely happy. 08/02/2019 upon evaluation today patient appears to be healing quite nicely and is making excellent progress at this point. Fortunately there is no evidence of active infection at this time which is good news. No fever chills noted 08/09/2019 on evaluation today patient's wound actually is continuing to show signs of excellent improvement. Is measuring much smaller this week even compared to last week and overall I am extremely pleased with the progress. Fortunately there is no evidence of active infection at this time. Electronic Signature(s) Signed: 08/09/2019 2:43:31 PM By: Lenda Kelp PA-C Entered By: Lenda Kelp on 08/09/2019 14:43:30 [name], [name] ([id number]) -------------------------------------------------------------------------------- Physical Exam Details Patient Name: [name], [name] Date of Service: 08/09/2019 2:00 PM Medical Record Number: [id number] Patient Account Number: [id number] Date of Birth/Sex: 04-24-1941 (79 y.o. F) Treating RN: [name] Primary Care Provider: [name] Other Clinician: Referring Provider: [name] Treating Provider/Extender: [name],  Brayan Votaw Weeks in Treatment: 9 Constitutional Well-nourished and well-hydrated in no acute distress. Respiratory normal breathing without difficulty. Psychiatric this patient is able to make decisions and demonstrates good insight into disease process. Alert and Oriented x 3. pleasant and cooperative. Notes Patient's wound bed again is showing signs of good epithelization granulation there is no need for sharp debridement today and overall very pleased with how things seem to be progressing. No fevers, chills, nausea, vomiting, or diarrhea. Electronic Signature(s) Signed: 08/09/2019 2:43:51 PM By: Lenda Kelp PA-C Entered By: Lenda Kelp on 08/09/2019 14:43:50 [name], [name] ([id number]) -------------------------------------------------------------------------------- Physician Orders Details Patient Name: [name], [name] Date of Service: 08/09/2019 2:00 PM Medical Record Number: [id number] Patient Account Number: [id number] Date of Birth/Sex: August 23, 1940 (79 y.o. F) Treating RN: [name] Primary Care Provider: [name] Other Clinician: Referring Provider: [name] Treating Provider/Extender: [name], Ocean Kearley Weeks in Treatment: 9 Verbal / Phone Orders: No Diagnosis Coding ICD-10 Coding Code Description S81.802A Unspecified open wound, left lower leg, initial encounter L97.828 Non-pressure chronic ulcer of other part of left lower leg with other specified severity L03.116 Cellulitis of left lower limb I48.0 Paroxysmal atrial fibrillation I10 Essential (primary) hypertension I50.42 Chronic combined systolic (congestive) and diastolic (congestive) heart failure Z79.01 Long term (current) use of anticoagulants Wound Cleansing Wound #1 Left,Posterior Lower Leg o Clean wound with Normal Saline. o May shower with protection. Anesthetic (add to Medication List) Wound #1 Left,Posterior Lower Leg o Topical Lidocaine 4% cream applied to wound bed prior to debridement (In  Clinic Only). Primary Wound Dressing Wound #1 Left,Posterior Lower Leg o Silver Collagen - moisten with hydrogel Secondary Dressing Wound #1 Left,Posterior Lower Leg o Boardered Foam Dressing Dressing  Change Frequency Wound #1 Left,Posterior Lower Leg o Change dressing every other day. Follow-up Appointments Wound #1 Left,Posterior Lower Leg o Return Appointment in 1 week. Edema Control Wound #1 Left,Posterior Lower Leg o Elevate legs to the level of the heart and pump ankles as often as possible o Other: - [place] #1 [place] Nurse may visit PRN to address patientos wound care needs. o FACE TO FACE ENCOUNTER: MEDICARE and MEDICAID PATIENTS: I certify that this patient is under my care and that I had a face-to- face encounter that meets the physician face-to-face encounter requirements with this patient on this date. The encounter with the patient was in whole or in part for the following MEDICAL CONDITION: (primary reason for [place]) MEDICAL NECESSITY: I certify, that based on my findings, NURSING services are a medically necessary home health service. HOME BOUND STATUS: I [name], [name] ([id number]) certify that my clinical findings support that this patient is homebound (i.e., Due to illness or injury, pt requires aid of supportive devices such as crutches, cane, wheelchairs, walkers, the use of special transportation or the assistance of another person to leave their place of residence. There is a normal inability to leave the home and doing so requires considerable and taxing effort. Other absences are for medical reasons / religious services and are infrequent or of short duration when for other reasons). o If current dressing causes regression in wound condition, may D/C ordered dressing product/s and apply Normal Saline Moist Dressing daily until next [place]  / Other MD appointment. [place] of regression in wound condition at [phone]. o Please direct any NON-WOUND related issues/requests for orders to patient's Primary Care Physician Electronic Signature(s) Signed: 08/09/2019 4:37:51 PM By: Worthy Keeler PA-C Signed: 08/09/2019 4:39:31 PM By: Montey Hora Entered By: Montey Hora on 08/09/2019 14:40:53 [place], [place]. ([id number]) -------------------------------------------------------------------------------- Problem List Details Patient Name: Sproles, Gentri B. Date of Service: 08/09/2019 2:00 PM Medical Record Number: [id number] Patient Account Number: [id number] Date of Birth/Sex: 28-Feb-1941 (79 y.o. F) Treating RN: Montey Hora Primary Care Provider: Emily Filbert Other Clinician: Referring Provider: Emily Filbert Treating Provider/Extender: Melburn Hake, Rindi Beechy Weeks in Treatment: 9 Active Problems ICD-10 Evaluated Encounter Code Description Active Date Today Diagnosis S81.802A Unspecified open wound, left lower leg, initial encounter 06/07/2019 No Yes L97.828 Non-pressure chronic ulcer of other part of left lower leg with other 06/07/2019 No Yes specified severity L03.116 Cellulitis of left lower limb 06/07/2019 No Yes I48.0 Paroxysmal atrial fibrillation 06/07/2019 No Yes I10 Essential (primary) hypertension 06/07/2019 No Yes I50.42 Chronic combined systolic (congestive) and diastolic (congestive) heart 06/07/2019 No Yes failure Z79.01 Long term (current) use of anticoagulants 06/07/2019 No Yes Inactive Problems Resolved Problems Electronic Signature(s) Signed: 08/09/2019 2:33:08 PM By: Worthy Keeler PA-C Entered By: Worthy Keeler on 08/09/2019 14:33:07 [name], [name] ([id number]) -------------------------------------------------------------------------------- Progress Note Details Patient Name: [name], [name] Date of Service: 08/09/2019 2:00 PM Medical Record Number: [id number] Patient Account Number:  [id number] Date of Birth/Sex: [birth year] (79 y.o. F) Treating RN: Montey Hora Primary Care Provider: Emily Filbert Other Clinician: Referring Provider: Emily Filbert Treating Provider/Extender: Melburn Hake, Harbert Fitterer Weeks in Treatment: 9 Subjective Chief Complaint Information obtained from Patient Left posterior LE ulcer History of Present Illness (HPI) 06/07/2019 upon evaluation today patient presents today for initial evaluation in our clinic concerning issues she is having with her left lower extremity. She tells me that she  hit her leg on a rocking chair when she was getting up and this was roughly at the beginning of December. Dr. Hyacinth Meeker who is her primary care provider has been helping with treating this area since that time. The patient states unfortunately this just is not getting any better. He is attempted salt water soaks as well as using antibiotic ointments topically unfortunately she has not noted much improvement. He wanted her to come to the wound center sooner but she declined although when things did not seem to be getting better she finally consented to this. Currently the patient does appear to have cellulitis of left lower extremity she also has atrial fibrillation noted for which she is on Eliquis this is probably part of the reason that she developed a large hematoma that she did. Subsequently she also has hypertension as well as congestive heart failure. The patient does not appear to be febrile today and really is doing fairly well as far as that is concerned although the leg is warm to touch pretty much throughout. No fevers, chills, nausea, vomiting, or diarrhea. She has not been on any oral antibiotics. 06/28/2019 patient actually is seen today in follow-up for her lower extremity ulcer. She is status post having actually spent 9 days in the hospital following when I sent her over for evaluation. They did have to keep her in the hospital and treat her with IV vancomycin  secondary to the fact that she unfortunately had a bacteria present that obviously can be managed with oral medication with her penicillin allergy. Nonetheless her leg appears to be doing tremendously better today. There is no signs of active infection and overall very pleased with how things are progressing. I do believe that the wound is also measuring better along with looking much less irritated overall I think she is going to do quite well at this point. 07/05/2019 on evaluation today patient actually appears to be doing quite well with regard to her wound which is measuring smaller and still appears to be doing tremendously better compared to prior evaluation. I do believe the Melburn Popper has been of benefit for her. 07/12/19 patient's wound actually appears to be doing excellent at this time in regard to the left posterior lower extremity. She has been tolerating the dressing changes without complication. Fortunately there is much improvement noted here there is some slough noted she has been using Santyl I think that still appropriate. With that being said I am going to go ahead and perform sharp debridement today to clear away some of the necrotic material as well. 07/19/2019 upon evaluation today patient actually appears to be doing quite well in regard to her leg ulcer. She has been using Santyl and has been doing excellent with this up to this point. However I think based on what I am seeing today we can likely proceed to doing something else, collagen, to help with getting this wound to heal more effectively and quickly. 07/26/2019 upon evaluation today patient appears to be doing excellent in regard to her lower extremity ulcer. She has been tolerating the dressing changes without complication. Fortunately the wound is continuing to measure smaller and does seem to be progressing quite nicely. I am very pleased with how things are going at this point. The patient likewise is extremely  happy. 08/02/2019 upon evaluation today patient appears to be healing quite nicely and is making excellent progress at this point. Fortunately there is no evidence of active infection at this time which is  good news. No fever chills noted 08/09/2019 on evaluation today patient's wound actually is continuing to show signs of excellent improvement. Is measuring much smaller this week even compared to last week and overall I am extremely pleased with the progress. Fortunately there is no evidence of active infection at this time. Objective Constitutional Well-nourished and well-hydrated in no acute distress. Vitals Time Taken: 2:11 PM, Height: 66 in, Weight: 201 lbs, BMI: 32.4, Temperature: 97.9 F, Pulse: 76 bpm, Blood Pressure: 129/58 mmHg. [name], [name] ([id number]) Respiratory normal breathing without difficulty. Psychiatric this patient is able to make decisions and demonstrates good insight into disease process. Alert and Oriented x 3. pleasant and cooperative. General Notes: Patient's wound bed again is showing signs of good epithelization granulation there is no need for sharp debridement today and overall very pleased with how things seem to be progressing. No fevers, chills, nausea, vomiting, or diarrhea. Integumentary (Hair, Skin) Wound #1 status is Open. Original cause of wound was Trauma. The wound is located on the Left,Posterior Lower Leg. The wound measures 0.6cm length x 0.9cm width x 0.1cm depth; 0.424cm^2 area and 0.042cm^3 volume. There is Fat Layer (Subcutaneous Tissue) Exposed exposed. There is a large amount of serous drainage noted. The wound margin is flat and intact. There is medium (34-66%) granulation within the wound bed. There is a medium (34-66%) amount of necrotic tissue within the wound bed including Adherent Slough. Assessment Active Problems ICD-10 Unspecified open wound, left lower leg, initial encounter Non-pressure chronic ulcer of other part of left lower  leg with other specified severity Cellulitis of left lower limb Paroxysmal atrial fibrillation Essential (primary) hypertension Chronic combined systolic (congestive) and diastolic (congestive) heart failure Long term (current) use of anticoagulants Plan Wound Cleansing: Wound #1 Left,Posterior Lower Leg: Clean wound with Normal Saline. May shower with protection. Anesthetic (add to Medication List): Wound #1 Left,Posterior Lower Leg: Topical Lidocaine 4% cream applied to wound bed prior to debridement (In Clinic Only). Primary Wound Dressing: Wound #1 Left,Posterior Lower Leg: Silver Collagen - moisten with hydrogel Secondary Dressing: Wound #1 Left,Posterior Lower Leg: Boardered Foam Dressing Dressing Change Frequency: Wound #1 Left,Posterior Lower Leg: Change dressing every other day. Follow-up Appointments: Wound #1 Left,Posterior Lower Leg: Return Appointment in 1 week. Edema Control: Wound #1 Left,Posterior Lower Leg: Elevate legs to the level of the heart and pump ankles as often as possible Other: - TubiGrip F Home Health: Wound #1 Left,Posterior Lower Leg: Continue Home Health Visits Home Health Nurse may visit PRN to address patient s wound care needs. FACE TO FACE ENCOUNTER: MEDICARE and MEDICAID PATIENTS: I certify that this patient is under my care and that I had a face-to-face encounter that meets the physician face-to-face encounter requirements with this patient on this date. The encounter with the patient was in whole or in [place], [name] ([id number]) part for the following MEDICAL CONDITION: (primary reason for Home Healthcare) MEDICAL NECESSITY: I certify, that based on my findings, NURSING services are a medically necessary home health service. HOME BOUND STATUS: I certify that my clinical findings support that this patient is homebound (i.e., Due to illness or injury, pt requires aid of supportive devices such as crutches, cane, wheelchairs, walkers, the use  of special transportation or the assistance of another person to leave their place of residence. There is a normal inability to leave the home and doing so requires considerable and taxing effort. Other absences are for medical reasons / religious services and are infrequent or of short duration  when for other reasons). If current dressing causes regression in wound condition, may D/C ordered dressing product/s and apply Normal Saline Moist Dressing daily until next Wound Healing Center / Other MD appointment. Notify Wound Healing Center of regression in wound condition at [phone]. Please direct any NON-WOUND related issues/requests for orders to patient's Primary Care Physician 1. I would recommend that we continue with the current wound care measures utilizing the collagen with hydrogel she seems to be doing extremely well with this and I think her wound is healing quite nicely I see no reason to change this out. 2. I am also can recommend that we continue with covering this with a bordered foam dressing that seems to be protecting the area very well 2 and again she is doing excellent with this. 3. We will get a use Tubigrip over top to help with edema control she is done excellent in that regard. We will see patient back for reevaluation in 1 week here in the clinic. If anything worsens or changes patient will contact our office for additional recommendations. Electronic Signature(s) Signed: 08/09/2019 2:44:22 PM By: Lenda Kelp PA-C Entered By: Lenda Kelp on 08/09/2019 14:44:22 [name], [name] ([id number]) -------------------------------------------------------------------------------- SuperBill Details Patient Name: [name], [name] Date of Service: 08/09/2019 Medical Record Number: [id number] Patient Account Number: [id number] Date of Birth/Sex: 1940/10/15 (79 y.o. F) Treating RN: [name] Primary Care Provider: [name] Other Clinician: Referring Provider: [name] Treating Provider/Extender: [name], Elea Holtzclaw Weeks in Treatment: 9 Diagnosis Coding ICD-10 Codes Code Description [phone] Unspecified open wound, left lower leg, initial encounter L97.828 Non-pressure chronic ulcer of other part of left lower leg with other specified severity L03.116 Cellulitis of left lower limb I48.0 Paroxysmal atrial fibrillation I10 Essential (primary) hypertension I50.42 Chronic combined systolic (congestive) and diastolic (congestive) heart failure Z79.01 Long term (current) use of anticoagulants Facility Procedures CPT4 Code: [id number] Description: 99213 - WOUND CARE VISIT-LEV 3 EST PT Modifier: Quantity: 1 Physician Procedures CPT4 Code: 8676195 Description: 99213 - WC PHYS LEVEL 3 - EST PT Modifier: Quantity: 1 CPT4 Code: Description: ICD-10 Diagnosis Description S81.802A Unspecified open wound, left lower leg, initial encounter L97.828 Non-pressure chronic ulcer of other part of left lower leg with other specifi L03.116 Cellulitis of left lower limb I48.0 Paroxysmal  atrial fibrillation Modifier: ed severity Quantity: Electronic Signature(s) Signed: 08/09/2019 2:44:40 PM By: Lenda Kelp PA-C Entered By: Lenda Kelp on 08/09/2019 14:44:39

## 2019-08-16 ENCOUNTER — Ambulatory Visit: Payer: Medicare Other | Admitting: Physician Assistant

## 2019-08-23 ENCOUNTER — Other Ambulatory Visit: Payer: Self-pay

## 2019-08-23 ENCOUNTER — Encounter: Payer: Medicare Other | Attending: Physician Assistant | Admitting: Physician Assistant

## 2019-08-23 DIAGNOSIS — Z7901 Long term (current) use of anticoagulants: Secondary | ICD-10-CM | POA: Insufficient documentation

## 2019-08-23 DIAGNOSIS — L03116 Cellulitis of left lower limb: Secondary | ICD-10-CM | POA: Insufficient documentation

## 2019-08-23 DIAGNOSIS — I5042 Chronic combined systolic (congestive) and diastolic (congestive) heart failure: Secondary | ICD-10-CM | POA: Insufficient documentation

## 2019-08-23 DIAGNOSIS — L97828 Non-pressure chronic ulcer of other part of left lower leg with other specified severity: Secondary | ICD-10-CM | POA: Insufficient documentation

## 2019-08-23 DIAGNOSIS — M109 Gout, unspecified: Secondary | ICD-10-CM | POA: Diagnosis not present

## 2019-08-23 DIAGNOSIS — I480 Paroxysmal atrial fibrillation: Secondary | ICD-10-CM | POA: Insufficient documentation

## 2019-08-23 DIAGNOSIS — W2203XD Walked into furniture, subsequent encounter: Secondary | ICD-10-CM | POA: Insufficient documentation

## 2019-08-23 DIAGNOSIS — I110 Hypertensive heart disease with heart failure: Secondary | ICD-10-CM | POA: Diagnosis not present

## 2019-08-23 DIAGNOSIS — S8012XD Contusion of left lower leg, subsequent encounter: Secondary | ICD-10-CM | POA: Diagnosis not present

## 2019-08-23 DIAGNOSIS — L97829 Non-pressure chronic ulcer of other part of left lower leg with unspecified severity: Secondary | ICD-10-CM | POA: Diagnosis present

## 2019-08-23 NOTE — Progress Notes (Addendum)
Perfecto, Octa B. ([id number]) Visit Report for 08/23/2019 Arrival Information Details Patient Name: Zechman, Evani B. Date of Service: 08/23/2019 8:30 AM Medical Record Number: [id number] Patient Account Number: [id number] Date of Birth/Sex: 11-24-1940 (79 y.o. F) Treating RN: [name] Primary Care Lounette Sloan: [name] Other Clinician: Referring France Noyce: [name] Treating Caitlan Chauca/Extender: [name], [name] Weeks in Treatment: 11 Visit Information History Since Last Visit Added or deleted any medications: No Patient Arrived: Dan Humphreys Any new allergies or adverse reactions: No Arrival Time: 08:35 Had a fall or experienced change in Yes Accompanied By: friend activities of daily living that may affect Transfer Assistance: None risk of falls: Patient Identification Verified: Yes Signs or symptoms of abuse/neglect since last visito No Secondary Verification Process Completed: Yes Hospitalized since last visit: No Patient Requires Transmission-Based Precautions: No Implantable device outside of the clinic excluding No Patient Has Alerts: Yes cellular tissue based products placed in the center Patient Alerts: 06/11/19 L TBI 0.74 since last visit: Has Dressing in Place as Prescribed: Yes Has Compression in Place as Prescribed: Yes Pain Present Now: No Electronic Signature(s) Signed: 08/26/2019 11:52:59 AM By: [name], [name], RN, [name], [name] RN, [name] Entered By: [name], [name], RN, [name], [name] on 08/23/2019 08:40:13 Cullars, Bellamy B. ([id number]) -------------------------------------------------------------------------------- Clinic Level of Care Assessment Details Patient Name: Stirn, Retha B. Date of Service: 08/23/2019 8:30 AM Medical Record Number: [id number] Patient Account Number: [id number] Date of Birth/Sex: Apr 21, 1941 (79 y.o. F) Treating RN: [name] Primary Care Ekansh Sherk: [name] Other Clinician: Referring Reiko Vinje: [name] Treating Sasha Rueth/Extender: [name], [name] Weeks in Treatment:  11 Clinic Level of Care Assessment Items TOOL 4 Quantity Score []  - Use when only an EandM is performed on FOLLOW-UP visit 0 ASSESSMENTS - Nursing Assessment / Reassessment []  - Reassessment of Co-morbidities (includes updates in patient status) 0 X- 1 5 Reassessment of Adherence to Treatment Plan ASSESSMENTS - Wound and Skin Assessment / Reassessment X - Simple Wound Assessment / Reassessment - one wound 1 5 []  - 0 Complex Wound Assessment / Reassessment - multiple wounds []  - 0 Dermatologic / Skin Assessment (not related to wound area) ASSESSMENTS - Focused Assessment X - Circumferential Edema Measurements - multi extremities 1 5 []  - 0 Nutritional Assessment / Counseling / Intervention []  - 0 Lower Extremity Assessment (monofilament, tuning fork, pulses) []  - 0 Peripheral Arterial Disease Assessment (using hand held doppler) ASSESSMENTS - Ostomy and/or Continence Assessment and Care []  - Incontinence Assessment and Management 0 []  - 0 Ostomy Care Assessment and Management (repouching, etc.) PROCESS - Coordination of Care X - Simple Patient / Family Education for ongoing care 1 15 []  - 0 Complex (extensive) Patient / Family Education for ongoing care []  - 0 Staff obtains , Records, Test Results / Process Orders []  - 0 Staff telephones HHA, Nursing Homes / Clarify orders / etc []  - 0 Routine Transfer to another Facility (non-emergent condition) []  - 0 Routine Hospital Admission (non-emergent condition) []  - 0 New Admissions / / Ordering NPWT, Apligraf, etc. []  - 0 Emergency Hospital Admission (emergent condition) X- 1 10 Simple Discharge Coordination []  - 0 Complex (extensive) Discharge Coordination PROCESS - Special Needs []  - Pediatric / Minor Patient Management 0 []  - 0 Isolation Patient Management []  - 0 Hearing / Language / Visual special needs []  - 0 Assessment of Community assistance (transportation, D/C planning,  etc.) Maring, [place] B. ( ) []  - 0 Additional assistance / Altered mentation []  - 0 Support Surface(s) Assessment (bed, cushion, seat, etc.)  INTERVENTIONS - Wound Cleansing / Measurement X - Simple Wound Cleansing - one wound 1 5 []  - 0 Complex Wound Cleansing - multiple wounds []  - 0 Wound Imaging (photographs - any number of wounds) []  - 0 Wound Tracing (instead of photographs) X- 1 5 Simple Wound Measurement - one wound []  - 0 Complex Wound Measurement - multiple wounds INTERVENTIONS - Wound Dressings X - Small Wound Dressing one or multiple wounds 1 10 []  - 0 Medium Wound Dressing one or multiple wounds []  - 0 Large Wound Dressing one or multiple wounds []  - 0 Application of Medications - topical []  - 0 Application of Medications - injection INTERVENTIONS - Miscellaneous []  - External ear exam 0 []  - 0 Specimen Collection (cultures, biopsies, blood, body fluids, etc.) []  - 0 Specimen(s) / Culture(s) sent or taken to Lab for analysis []  - 0 Patient Transfer (multiple staff / Civil Service fast streamer / Similar devices) []  - 0 Simple Staple / Suture removal (25 or less) []  - 0 Complex Staple / Suture removal (26 or more) []  - 0 Hypo / Hyperglycemic Management (close monitor of Blood Glucose) []  - 0 Ankle / Brachial Index (ABI) - do not check if billed separately []  - 0 Vital Signs Has the patient been seen at the hospital within the last three years: Yes Total Score: 60 Level Of Care: New/Established - Level 2 Electronic Signature(s) Signed: 08/26/2019 11:52:59 AM By: Gretta Cool, [name], RN, [name], [name] RN, [name] Entered By: Gretta Cool, [name], RN, [name], [name] on 08/23/2019 09:12:32 Seeley, Kaedance B. ([id number]) -------------------------------------------------------------------------------- Encounter Discharge Information Details Patient Name: Kontos, Jalen B. Date of Service: 08/23/2019 8:30 AM Medical Record Number: [id number] Patient Account Number: [id number] Date of Birth/Sex: [birth year] (79 y.o.  F) Treating RN: Cornell Barman Primary Care Imanol Bihl: Emily Filbert Other Clinician: Referring Timoth Schara: Emily Filbert Treating Leam Madero/Extender: Melburn Hake, [name] Weeks in Treatment: 11 Encounter Discharge Information Items Discharge Condition: Stable Ambulatory Status: Walker Discharge Destination: Home Transportation: Private Auto Accompanied By: friend Schedule Follow-up Appointment: Yes Clinical Summary of Care: Electronic Signature(s) Signed: 08/26/2019 11:52:59 AM By: Gretta Cool, [name], RN, [name], [name] RN, [name] Entered By: Gretta Cool, [name], RN, [name], [name] on 08/23/2019 [place], [place] ([id number]) -------------------------------------------------------------------------------- Lower Extremity Assessment Details Patient Name: Swader, Jakera B. Date of Service: 08/23/2019 8:30 AM Medical Record Number: [id number] Patient Account Number: [id number] Date of Birth/Sex: 1940-08-29 (79 y.o. F) Treating RN: Cornell Barman Primary Care Eleshia Wooley: Emily Filbert Other Clinician: Referring Cailah Reach: Emily Filbert Treating Virat Prather/Extender: Melburn Hake, [name] Weeks in Treatment: 11 Edema Assessment Assessed: [Left: No] [Right: No] Edema: [Left: Ye] [Right: s] Calf Left: Right: Point of Measurement: 25 cm From Medial Instep 34.8 cm cm Ankle Left: Right: Point of Measurement: 13 cm From Medial Instep 24 cm cm Vascular Assessment Pulses: Dorsalis Pedis Palpable: [Left:Yes] Electronic Signature(s) Signed: 08/26/2019 11:52:59 AM By: Gretta Cool, [name], RN, [name], [name] RN, [name] Entered By: Gretta Cool, [name], RN, [name], [name] on 08/23/2019 08:49:24 Matt, Chong B. ([id number]) -------------------------------------------------------------------------------- Multi Wound Chart Details Patient Name: Debois, Latronda B. Date of Service: 08/23/2019 8:30 AM Medical Record Number: [id number] Patient Account Number: [id number] Date of Birth/Sex: January 02, 1941 (79 y.o. F) Treating RN: Cornell Barman Primary Care Laritza Vokes: Emily Filbert Other Clinician: Referring  Jylian Pappalardo: Emily Filbert Treating Madelaine Whipple/Extender: Melburn Hake, [name] Weeks in Treatment: 11 Vital Signs Height(in): 66 Pulse(bpm): 65 Weight(lbs): 201 Blood Pressure(mmHg): 169/60 Body Mass Index(BMI): 32 Temperature(F): 97.9 Respiratory Rate(breaths/min): 15 Photos: [N/A:N/A] Wound Location: Left, Posterior Lower Leg N/A N/A Wounding Event: Trauma N/A N/A Primary Etiology: Trauma,  Other N/A N/A Comorbid History: Cataracts, Arrhythmia, Congestive N/A N/A Heart Failure, Hypertension, Gout Date Acquired: 04/23/2019 N/A N/A Weeks of Treatment: 11 N/A N/A Wound Status: Open N/A N/A Measurements L x W x D (cm) 0.2x0.3x0.1 N/A N/A Area (cm) : 0.047 N/A N/A Volume (cm) : 0.005 N/A N/A % Reduction in Area: 99.70% N/A N/A % Reduction in Volume: 99.70% N/A N/A Classification: Full Thickness Without Exposed N/A N/A Support Structures Exudate Amount: Small N/A N/A Exudate Type: Serous N/A N/A Exudate Color: amber N/A N/A Wound Margin: Flat and Intact N/A N/A Granulation Amount: Large (67-100%) N/A N/A Granulation Quality: Pink N/A N/A Necrotic Amount: None Present (0%) N/A N/A Exposed Structures: Fat Layer (Subcutaneous Tissue) N/A N/A Exposed: Yes Fascia: No Tendon: No Muscle: No Joint: No Bone: No Epithelialization: Medium (34-66%) N/A N/A Treatment Notes Electronic Signature(s) Signed: 08/26/2019 11:52:59 AM By: [name], [name], RN, [name], [name] RN, [name] Entered By: [name], [name], RN, [name], [name] on 08/23/2019 09:02:37 Kunath, Salina B. ([id number]) Rumer, Cornell B. ([id number]) -------------------------------------------------------------------------------- Multi-Disciplinary Care Plan Details Patient Name: Bartolini, Normagene B. Date of Service: 08/23/2019 8:30 AM Medical Record Number: [id number] Patient Account Number: [id number] Date of Birth/Sex: [birth year] (79 y.o. F) Treating RN: [name] Primary Care Dilynn Munroe: [name] Other Clinician: Referring Scout Gumbs: [name] Treating  Chelisa Hennen/Extender: [name], [name] Weeks in Treatment: 11 Active Inactive Necrotic Tissue Nursing Diagnoses: Impaired tissue integrity related to necrotic/devitalized tissue Goals: Necrotic/devitalized tissue will be minimized in the wound bed Date Initiated: 06/07/2019 Target Resolution Date: 08/31/2019 Goal Status: Active Interventions: Provide education on necrotic tissue and debridement process Notes: Orientation to the Wound Care Program Nursing Diagnoses: Knowledge deficit related to the wound healing center program Goals: Patient/caregiver will verbalize understanding of the Wound Healing Center Program Date Initiated: 06/07/2019 Target Resolution Date: 08/31/2019 Goal Status: Active Interventions: Provide education on orientation to the wound center Notes: Pain, Acute or Chronic Nursing Diagnoses: Pain, acute or chronic: actual or potential Goals: Patient/caregiver will verbalize adequate pain control between visits Date Initiated: 06/07/2019 Target Resolution Date: 08/31/2019 Goal Status: Active Interventions: Complete pain assessment as per visit requirements Notes: Wound/Skin Impairment Nursing Diagnoses: Impaired tissue integrity Salsbury, Aliviah B. ([id number]) Goals: Ulcer/skin breakdown will heal within 14 weeks Date Initiated: 06/07/2019 Target Resolution Date: 08/31/2019 Goal Status: Active Interventions: Assess patient/caregiver ability to obtain necessary supplies Assess patient/caregiver ability to perform ulcer/skin care regimen upon admission and as needed Assess ulceration(s) every visit Notes: Electronic Signature(s) Signed: 08/26/2019 11:52:59 AM By: [name], [name], RN, [name], [name] RN, [name] Entered By: [name], [name], RN, [name], [name] on 08/23/2019 09:02:29 Bevis, Bayleigh B. ([id number]) -------------------------------------------------------------------------------- Pain Assessment Details Patient Name: Clodfelter, Tenea B. Date of Service: 08/23/2019 8:30 AM Medical Record  Number: [id number] Patient Account Number: [id number] Date of Birth/Sex: March 19, 1941 (79 y.o. F) Treating RN: [name] Primary Care Annielee Jemmott: [name] Other Clinician: Referring Emanii Bugbee: [name] Treating Adaly Puder/Extender: [name], [name] Weeks in Treatment: 11 Active Problems Location of Pain Severity and Description of Pain Patient Has Paino No Site Locations Pain Management and Medication Current Pain Management: Electronic Signature(s) Signed: 08/26/2019 11:52:59 AM By: [name], [name], RN, [name], [name] RN, [name] Entered By: [name], [name], RN, [name], [name] on 08/23/2019 08:40:59 Albor, Vicki B. ([id number]) -------------------------------------------------------------------------------- Patient/Caregiver Education Details Patient Name: Seals, Adley B. Date of Service: 08/23/2019 8:30 AM Medical Record Number: [id number] Patient Account Number: [id number] Date of Birth/Gender: 1940/12/23 (79 y.o. F) Treating RN: [name] Primary Care Physician: [name] Other Clinician: Referring Physician: [name] Treating Physician/Extender: [name],  [name] Weeks in Treatment: 11 Education Assessment Education Provided To: Patient Education Topics Provided Wound/Skin Impairment: Handouts: Caring for Your Ulcer Methods: Demonstration Responses: State content correctly Electronic Signature(s) Signed: 08/26/2019 11:52:59 AM By: [name], [name], RN, [name], [name] RN, [name] Entered By: [name], [name], RN, [name], [name] on 08/23/2019 09:13:37 Wyeth, Mareta B. ([id number]) -------------------------------------------------------------------------------- Wound Assessment Details Patient Name: Lisle, Jerusha B. Date of Service: 08/23/2019 8:30 AM Medical Record Number: [id number] Patient Account Number: [id number] Date of Birth/Sex: January 31, 1941 (79 y.o. F) Treating RN: [name] Primary Care Rhyleigh Grassel: [name] Other Clinician: Referring Bookert Guzzi: [name] Treating Lollie Gunner/Extender: [name], [name] Weeks in Treatment:  11 Wound Status Wound Number: 1 Primary Trauma, Other Etiology: Wound Location: Left, Posterior Lower Leg Wound Status: Open Wounding Event: Trauma Comorbid Cataracts, Arrhythmia, Congestive Heart Failure, Date Acquired: 04/23/2019 History: Hypertension, Gout Weeks Of Treatment: 11 Clustered Wound: No Photos Wound Measurements Length: (cm) 0.2 Width: (cm) 0.3 Depth: (cm) 0.1 Area: (cm) 0.047 Volume: (cm) 0.005 % Reduction in Area: 99.7% % Reduction in Volume: 99.7% Epithelialization: Medium (34-66%) Wound Description Classification: Full Thickness Without Exposed Support Structu Wound Margin: Flat and Intact Exudate Amount: Small Exudate Type: Serous Exudate Color: amber res Foul Odor After Cleansing: No Slough/Fibrino Yes Wound Bed Granulation Amount: Large (67-100%) Exposed Structure Granulation Quality: Pink Fascia Exposed: No Necrotic Amount: None Present (0%) Fat Layer (Subcutaneous Tissue) Exposed: Yes Tendon Exposed: No Muscle Exposed: No Joint Exposed: No Bone Exposed: No Treatment Notes Wound #1 (Left, Posterior Lower Leg) 1. Cleansed with: Clean wound with Normal Saline 4. Dressing Applied: Contact layer 5. Secondary Dressing Applied ABD Pad Portillo, Jaelyne B. ([id number]) 7. Secured with Tubigrip Other (specify in notes) Notes adaptec, ABD, roll gauze, tubigrip Electronic Signature(s) Signed: 08/26/2019 11:52:59 AM By: [name], [name], RN, [name], [name] RN, [name] Entered By: [name], [name], RN, [name], [name] on 08/23/2019 09:02:17 Marandola, Kaleia B. ([id number]) -------------------------------------------------------------------------------- Vitals Details Patient Name: Fitzwater, Lacheryl B. Date of Service: 08/23/2019 8:30 AM Medical Record Number: [id number] Patient Account Number: [id number] Date of Birth/Sex: 1941-03-28 (79 y.o. F) Treating RN: [name] Primary Care Nephtali Docken: [name] Other Clinician: Referring Jacqualyn Sedgwick: [name] Treating Hawley Michel/Extender: [name],  [name] Weeks in Treatment: 11 Vital Signs Time Taken: 08:39 Temperature (F): 97.9 Height (in): 66 Pulse (bpm): 65 Weight (lbs): 201 Respiratory Rate (breaths/min): 15 Body Mass Index (BMI): 32.4 Blood Pressure (mmHg): 169/60 Reference Range: 80 - 120 mg / dl Electronic Signature(s) Signed: 08/26/2019 11:52:59 AM By: [name], [name], RN, [name], [name] RN, [name] Entered By: [name], [name], RN, [name], [name] on 08/23/2019 08:40:51

## 2019-08-23 NOTE — Progress Notes (Addendum)
[name], Soul B. ([id number]) Visit Report for 08/23/2019 Chief Complaint Document Details Patient Name: [name], [name] Date of Service: 08/23/2019 8:30 AM Medical Record Number: [id number] Patient Account Number: [id number] Date of Birth/Sex: [birth year] (79 y.o. F) Treating RN: [name] Primary Care Provider: [name] Other Clinician: Referring Provider: [name] Treating Provider/Extender: [name], Makenly Larabee Weeks in Treatment: 11 Information Obtained from: Patient Chief Complaint Left posterior LE ulcer Electronic Signature(s) Signed: 08/23/2019 8:39:47 AM By: Lenda Kelp PA-C Entered By: Lenda Kelp on 08/23/2019 08:39:46 [name], [name] ([id number]) -------------------------------------------------------------------------------- HPI Details Patient Name: [name], [name] Date of Service: 08/23/2019 8:30 AM Medical Record Number: [id number] Patient Account Number: [id number] Date of Birth/Sex: August 28, 1940 (79 y.o. F) Treating RN: [name] Primary Care Provider: [name] Other Clinician: Referring Provider: [name] Treating Provider/Extender: [name], Jessia Kief Weeks in Treatment: 11 History of Present Illness HPI Description: 06/07/2019 upon evaluation today patient presents today for initial evaluation in our clinic concerning issues she is having with her left lower extremity. She tells me that she hit her leg on a rocking chair when she was getting up and this was roughly at the beginning of December. Dr. Hyacinth Meeker who is her primary care provider has been helping with treating this area since that time. The patient states unfortunately this just is not getting any better. He is attempted salt water soaks as well as using antibiotic ointments topically unfortunately she has not noted much improvement. He wanted her to come to the wound center sooner but she declined although when things did not seem to be getting better she finally consented to this. Currently the  patient does appear to have cellulitis of left lower extremity she also has atrial fibrillation noted for which she is on Eliquis this is probably part of the reason that she developed a large hematoma that she did. Subsequently she also has hypertension as well as congestive heart failure. The patient does not appear to be febrile today and really is doing fairly well as far as that is concerned although the leg is warm to touch pretty much throughout. No fevers, chills, nausea, vomiting, or diarrhea. She has not been on any oral antibiotics. 06/28/2019 patient actually is seen today in follow-up for her lower extremity ulcer. She is status post having actually spent 9 days in the hospital following when I sent her over for evaluation. They did have to keep her in the hospital and treat her with IV vancomycin secondary to the fact that she unfortunately had a bacteria present that obviously can be managed with oral medication with her penicillin allergy. Nonetheless her leg appears to be doing tremendously better today. There is no signs of active infection and overall very pleased with how things are progressing. I do believe that the wound is also measuring better along with looking much less irritated overall I think she is going to do quite well at this point. 07/05/2019 on evaluation today patient actually appears to be doing quite well with regard to her wound which is measuring smaller and still appears to be doing tremendously better compared to prior evaluation. I do believe the Melburn Popper has been of benefit for her. 07/12/19 patient's wound actually appears to be doing excellent at this time in regard to the left posterior lower extremity. She has been tolerating the dressing changes without complication. Fortunately there is much improvement noted here there is some slough noted she has been using Santyl I think that still appropriate. With  that being said I am going to go ahead and perform sharp  debridement today to clear away some of the necrotic material as well. 07/19/2019 upon evaluation today patient actually appears to be doing quite well in regard to her leg ulcer. She has been using Santyl and has been doing excellent with this up to this point. However I think based on what I am seeing today we can likely proceed to doing something else, collagen, to help with getting this wound to heal more effectively and quickly. 07/26/2019 upon evaluation today patient appears to be doing excellent in regard to her lower extremity ulcer. She has been tolerating the dressing changes without complication. Fortunately the wound is continuing to measure smaller and does seem to be progressing quite nicely. I am very pleased with how things are going at this point. The patient likewise is extremely happy. 08/02/2019 upon evaluation today patient appears to be healing quite nicely and is making excellent progress at this point. Fortunately there is no evidence of active infection at this time which is good news. No fever chills noted 08/09/2019 on evaluation today patient's wound actually is continuing to show signs of excellent improvement. Is measuring much smaller this week even compared to last week and overall I am extremely pleased with the progress. Fortunately there is no evidence of active infection at this time. 08/23/2019 upon evaluation today patient appears to be doing well with regard to her original wound she is showing some signs of improvement here which is great news. Fortunately there is no signs of active infection at this time. No fevers, chills, nausea, vomiting, or diarrhea. Electronic Signature(s) Signed: 08/23/2019 9:07:12 AM By: Lenda Kelp PA-C Entered By: Lenda Kelp on 08/23/2019 09:07:12 [name], [name] ([id number]) -------------------------------------------------------------------------------- Physical Exam Details Patient Name: [name], [name] Date of Service: 08/23/2019  8:30 AM Medical Record Number: [id number] Patient Account Number: [id number] Date of Birth/Sex: [birth year] (79 y.o. F) Treating RN: [name] Primary Care Provider: [name] Other Clinician: Referring Provider: [name] Treating Provider/Extender: [name], Briseida Gittings Weeks in Treatment: 11 Constitutional Well-nourished and well-hydrated in no acute distress. Respiratory normal breathing without difficulty. Psychiatric this patient is able to make decisions and demonstrates good insight into disease process. Alert and Oriented x 3. pleasant and cooperative. Notes Patient's wound bed currently showed signs of excellent epithelization. There is just a very small area remaining at this point in the central portion of the wound. Overall I am extremely pleased with how things seem to be progressing. There is some scar tissue surrounding but overall I think that the patient is managing quite nicely. The biggest issue I see is that she unfortunately had a fall in the past several days where she went to bend over to pick up a cat toy and subsequently ended up falling quite significantly causing multiple bruises. Fortunately she did not strike her head and seems to be doing okay in that regard unfortunately she does have bruises all over she did not break anything but in general I am very concerned with her being on Eliquis that she needs to avoid falls at all cost. She does have her rolling walker but she states she did not feel like she needed it just to go out to where she was headed. She also did not have her call button nor her phone with her so she had to wait for someone to come by and help her. Obviously this is something she needs to be very  cautious of. Electronic Signature(s) Signed: 08/23/2019 9:08:36 AM By: Worthy Keeler PA-C Entered By: Worthy Keeler on 08/23/2019 09:08:35 [place] [name], [name]  ([id number]) -------------------------------------------------------------------------------- Physician Orders Details Patient Name: [name], [name] Date of Service: 08/23/2019 8:30 AM Medical Record Number: [id number] Patient Account Number: [id number] Date of Birth/Sex: 12/30/1940 (79 y.o. F) Treating RN: Cornell Barman Primary Care Provider: Emily Filbert Other Clinician: Referring Provider: Emily Filbert Treating Provider/Extender: Melburn Hake, Irish Piech Weeks in Treatment: 58 Verbal / Phone Orders: No Diagnosis Coding ICD-10 Coding Code Description S81.802A Unspecified open wound, left lower leg, initial encounter L97.828 Non-pressure chronic ulcer of other part of left lower leg with other specified severity L03.116 Cellulitis of left lower limb I48.0 Paroxysmal atrial fibrillation I10 Essential (primary) hypertension I50.42 Chronic combined systolic (congestive) and diastolic (congestive) heart failure Z79.01 Long term (current) use of anticoagulants Wound Cleansing Wound #1 Left,Posterior Lower Leg o Clean wound with Normal Saline. o May shower with protection. Anesthetic (add to Medication List) Wound #1 Left,Posterior Lower Leg o Topical Lidocaine 4% cream applied to wound bed prior to debridement (In Clinic Only). Primary Wound Dressing Wound #1 Left,Posterior Lower Leg o Non-adherent pad Secondary Dressing Wound #1 Left,Posterior Lower Leg o ABD and Kerlix/Conform Dressing Change Frequency Wound #1 Left,Posterior Lower Leg o Change dressing every other day. Follow-up Appointments Wound #1 Left,Posterior Lower Leg o Return Appointment in 1 week. Edema Control Wound #1 Left,Posterior Lower Leg o Elevate legs to the level of the heart and pump ankles as often as possible o Other: - [place] #1 [place] Nurse may visit PRN to address patientos wound care needs. o FACE TO FACE  ENCOUNTER: MEDICARE and MEDICAID PATIENTS: I certify that this patient is under my care and that I had a face-to- face encounter that meets the physician face-to-face encounter requirements with this patient on this date. The encounter with the patient was in whole or in part for the following MEDICAL CONDITION: (primary reason for [place]) MEDICAL NECESSITY: I certify, that based on my findings, NURSING services are a medically necessary home health service. HOME BOUND STATUS: I [name], [name] ([id number]) certify that my clinical findings support that this patient is homebound (i.e., Due to illness or injury, pt requires aid of supportive devices such as crutches, cane, wheelchairs, walkers, the use of special transportation or the assistance of another person to leave their place of residence. There is a normal inability to leave the home and doing so requires considerable and taxing effort. Other absences are for medical reasons / religious services and are infrequent or of short duration when for other reasons). o If current dressing causes regression in wound condition, may D/C ordered dressing product/s and apply Normal Saline Moist Dressing daily until next [place] / Other MD appointment. [place] of regression in wound condition at [phone]. o Please direct any NON-WOUND related issues/requests for orders to patient's Primary Care Physician Electronic Signature(s) Signed: 08/26/2019 9:03:29 AM By: Worthy Keeler PA-C Signed: 08/26/2019 11:52:59 AM By: Gretta Cool, [name], RN, [name], [name] RN, [name] Entered By: Gretta Cool, [name], RN, [name], [name] on 08/23/2019 09:03:32 [name], [name] ([id number]) -------------------------------------------------------------------------------- Problem List Details Patient Name: [name], [name] Date of Service: 08/23/2019 8:30 AM Medical Record Number: [id number] Patient Account Number: [id number] Date of Birth/Sex: [birth year] (79 y.o.  F) Treating RN: Montey Hora Primary Care Provider: Emily Filbert Other Clinician: Referring Provider: Emily Filbert Treating  Provider/Extender: Linwood DibblesSTONE III, Deron Poole Weeks in Treatment: 11 Active Problems ICD-10 Evaluated Encounter Code Description Active Date Today Diagnosis S81.802A Unspecified open wound, left lower leg, initial encounter 06/07/2019 No Yes L97.828 Non-pressure chronic ulcer of other part of left lower leg with other 06/07/2019 No Yes specified severity L03.116 Cellulitis of left lower limb 06/07/2019 No Yes I48.0 Paroxysmal atrial fibrillation 06/07/2019 No Yes I10 Essential (primary) hypertension 06/07/2019 No Yes I50.42 Chronic combined systolic (congestive) and diastolic (congestive) heart 06/07/2019 No Yes failure Z79.01 Long term (current) use of anticoagulants 06/07/2019 No Yes Inactive Problems Resolved Problems Electronic Signature(s) Signed: 08/23/2019 8:39:39 AM By: Lenda KelpStone III, Barbera Perritt PA-C Entered By: Lenda KelpStone III, Gwendy Boeder on 08/23/2019 08:39:39 [name], [name] ([id number]) -------------------------------------------------------------------------------- Progress Note Details Patient Name: [name], [name] Date of Service: 08/23/2019 8:30 AM Medical Record Number: [id number] Patient Account Number: [id number] Date of Birth/Sex: 16-Nov-1940 (79 y.o. F) Treating RN: Curtis Sitesorthy, Joanna Primary Care Provider: Bethann PunchesMiller, Mark Other Clinician: Referring Provider: Bethann PunchesMiller, Mark Treating Provider/Extender: Linwood DibblesSTONE III, Kora Groom Weeks in Treatment: 11 Subjective Chief Complaint Information obtained from Patient Left posterior LE ulcer History of Present Illness (HPI) 06/07/2019 upon evaluation today patient presents today for initial evaluation in our clinic concerning issues she is having with her left lower extremity. She tells me that she hit her leg on a rocking chair when she was getting up and this was roughly at the beginning of December. Dr. Hyacinth MeekerMiller who is her primary care provider has been  helping with treating this area since that time. The patient states unfortunately this just is not getting any better. He is attempted salt water soaks as well as using antibiotic ointments topically unfortunately she has not noted much improvement. He wanted her to come to the wound center sooner but she declined although when things did not seem to be getting better she finally consented to this. Currently the patient does appear to have cellulitis of left lower extremity she also has atrial fibrillation noted for which she is on Eliquis this is probably part of the reason that she developed a large hematoma that she did. Subsequently she also has hypertension as well as congestive heart failure. The patient does not appear to be febrile today and really is doing fairly well as far as that is concerned although the leg is warm to touch pretty much throughout. No fevers, chills, nausea, vomiting, or diarrhea. She has not been on any oral antibiotics. 06/28/2019 patient actually is seen today in follow-up for her lower extremity ulcer. She is status post having actually spent 9 days in the hospital following when I sent her over for evaluation. They did have to keep her in the hospital and treat her with IV vancomycin secondary to the fact that she unfortunately had a bacteria present that obviously can be managed with oral medication with her penicillin allergy. Nonetheless her leg appears to be doing tremendously better today. There is no signs of active infection and overall very pleased with how things are progressing. I do believe that the wound is also measuring better along with looking much less irritated overall I think she is going to do quite well at this point. 07/05/2019 on evaluation today patient actually appears to be doing quite well with regard to her wound which is measuring smaller and still appears to be doing tremendously better compared to prior evaluation. I do believe the Melburn PopperSantyl  has been of benefit for her. 07/12/19 patient's wound actually appears to be doing excellent at this time in  regard to the left posterior lower extremity. She has been tolerating the dressing changes without complication. Fortunately there is much improvement noted here there is some slough noted she has been using Santyl I think that still appropriate. With that being said I am going to go ahead and perform sharp debridement today to clear away some of the necrotic material as well. 07/19/2019 upon evaluation today patient actually appears to be doing quite well in regard to her leg ulcer. She has been using Santyl and has been doing excellent with this up to this point. However I think based on what I am seeing today we can likely proceed to doing something else, collagen, to help with getting this wound to heal more effectively and quickly. 07/26/2019 upon evaluation today patient appears to be doing excellent in regard to her lower extremity ulcer. She has been tolerating the dressing changes without complication. Fortunately the wound is continuing to measure smaller and does seem to be progressing quite nicely. I am very pleased with how things are going at this point. The patient likewise is extremely happy. 08/02/2019 upon evaluation today patient appears to be healing quite nicely and is making excellent progress at this point. Fortunately there is no evidence of active infection at this time which is good news. No fever chills noted 08/09/2019 on evaluation today patient's wound actually is continuing to show signs of excellent improvement. Is measuring much smaller this week even compared to last week and overall I am extremely pleased with the progress. Fortunately there is no evidence of active infection at this time. 08/23/2019 upon evaluation today patient appears to be doing well with regard to her original wound she is showing some signs of improvement here which is great news. Fortunately  there is no signs of active infection at this time. No fevers, chills, nausea, vomiting, or diarrhea. Objective Constitutional Buffa, Alaze B. ([id number]) Well-nourished and well-hydrated in no acute distress. Vitals Time Taken: 8:39 AM, Height: 66 in, Weight: 201 lbs, BMI: 32.4, Temperature: 97.9 F, Pulse: 65 bpm, Respiratory Rate: 15 breaths/min, Blood Pressure: 169/60 mmHg. Respiratory normal breathing without difficulty. Psychiatric this patient is able to make decisions and demonstrates good insight into disease process. Alert and Oriented x 3. pleasant and cooperative. General Notes: Patient's wound bed currently showed signs of excellent epithelization. There is just a very small area remaining at this point in the central portion of the wound. Overall I am extremely pleased with how things seem to be progressing. There is some scar tissue surrounding but overall I think that the patient is managing quite nicely. The biggest issue I see is that she unfortunately had a fall in the past several days where she went to bend over to pick up a cat toy and subsequently ended up falling quite significantly causing multiple bruises. Fortunately she did not strike her head and seems to be doing okay in that regard unfortunately she does have bruises all over she did not break anything but in general I am very concerned with her being on Eliquis that she needs to avoid falls at all cost. She does have her rolling walker but she states she did not feel like she needed it just to go out to where she was headed. She also did not have her call button nor her phone with her so she had to wait for someone to come by and help her. Obviously this is something she needs to be very cautious of. Integumentary (Hair, Skin) Wound #  1 status is Open. Original cause of wound was Trauma. The wound is located on the Left,Posterior Lower Leg. The wound measures 0.2cm length x 0.3cm width x 0.1cm depth; 0.047cm^2 area  and 0.005cm^3 volume. There is Fat Layer (Subcutaneous Tissue) Exposed exposed. There is a small amount of serous drainage noted. The wound margin is flat and intact. There is large (67-100%) pink granulation within the wound bed. There is no necrotic tissue within the wound bed. Assessment Active Problems ICD-10 Unspecified open wound, left lower leg, initial encounter Non-pressure chronic ulcer of other part of left lower leg with other specified severity Cellulitis of left lower limb Paroxysmal atrial fibrillation Essential (primary) hypertension Chronic combined systolic (congestive) and diastolic (congestive) heart failure Long term (current) use of anticoagulants Plan Wound Cleansing: Wound #1 Left,Posterior Lower Leg: Clean wound with Normal Saline. May shower with protection. Anesthetic (add to Medication List): Wound #1 Left,Posterior Lower Leg: Topical Lidocaine 4% cream applied to wound bed prior to debridement (In Clinic Only). Primary Wound Dressing: Wound #1 Left,Posterior Lower Leg: Non-adherent pad Secondary Dressing: Wound #1 Left,Posterior Lower Leg: ABD and Kerlix/Conform Dressing Change Frequency: Wound #1 Left,Posterior Lower Leg: Change dressing every other day. Follow-up Appointments: Wound #1 Left,Posterior Lower Leg: Return Appointment in 1 week. [name], [name] ([id number]) Edema Control: Wound #1 Left,Posterior Lower Leg: Elevate legs to the level of the heart and pump ankles as often as possible Other: - TubiGrip F Home Health: Wound #1 Left,Posterior Lower Leg: Continue Home Health Visits Home Health Nurse may visit PRN to address patient s wound care needs. FACE TO FACE ENCOUNTER: MEDICARE and MEDICAID PATIENTS: I certify that this patient is under my care and that I had a face-to-face encounter that meets the physician face-to-face encounter requirements with this patient on this date. The encounter with the patient was in whole or in part for  the following MEDICAL CONDITION: (primary reason for Home Healthcare) MEDICAL NECESSITY: I certify, that based on my findings, NURSING services are a medically necessary home health service. HOME BOUND STATUS: I certify that my clinical findings support that this patient is homebound (i.e., Due to illness or injury, pt requires aid of supportive devices such as crutches, cane, wheelchairs, walkers, the use of special transportation or the assistance of another person to leave their place of residence. There is a normal inability to leave the home and doing so requires considerable and taxing effort. Other absences are for medical reasons / religious services and are infrequent or of short duration when for other reasons). If current dressing causes regression in wound condition, may D/C ordered dressing product/s and apply Normal Saline Moist Dressing daily until next Wound Healing Center / Other MD appointment. Notify Wound Healing Center of regression in wound condition at [phone]. Please direct any NON-WOUND related issues/requests for orders to patient's Primary Care Physician 1. My suggestion at this point is good be that we go ahead and have her continue to protect this area. I am a recommend Adaptic over the region and then subsequently will just monitor for any signs of anything worsening although I believe this will likely do quite well. 2. I am also can recommend that we go ahead and use a ABD and con form to secure in place and then subsequently Tubigrip over top. Hopefully she will be healed by next week. 3. With regard to her falls I did have a extensive conversation with her today regarding being cautious with falling. Obviously I does concern me especially being  on her Eliquis she was very lucky #1 not to break anything in #2 not to struck her head causing bleeding in her brain. With that being said I explained how severe that could be and that she really needs to be extremely  cautious at this point. The patient voiced understanding and states that she is good to be much more cautious. We will see patient back for reevaluation in 1 week here in the clinic. If anything worsens or changes patient will contact our office for additional recommendations. Electronic Signature(s) Signed: 08/23/2019 9:10:01 AM By: Lenda Kelp PA-C Entered By: Lenda Kelp on 08/23/2019 09:10:01 Reindel, Austynn B. ([id number]) -------------------------------------------------------------------------------- SuperBill Details Patient Name: Trego, Mirian B. Date of Service: 08/23/2019 Medical Record Number: [id number] Patient Account Number: [id number] Date of Birth/Sex: 07-28-1940 (79 y.o. F) Treating RN: [name] Primary Care Provider: [name] Other Clinician: Referring Provider: [name] Treating Provider/Extender: [name], Lynel Forester Weeks in Treatment: 11 Diagnosis Coding ICD-10 Codes Code Description [phone] Unspecified open wound, left lower leg, initial encounter L97.828 Non-pressure chronic ulcer of other part of left lower leg with other specified severity L03.116 Cellulitis of left lower limb I48.0 Paroxysmal atrial fibrillation I10 Essential (primary) hypertension I50.42 Chronic combined systolic (congestive) and diastolic (congestive) heart failure Z79.01 Long term (current) use of anticoagulants Facility Procedures CPT4 Code: [id number] Description: [phone] - WOUND CARE VISIT-LEV 2 EST PT Modifier: Quantity: 1 Physician Procedures CPT4 Code: 9528413 Description: 99213 - WC PHYS LEVEL 3 - EST PT Modifier: Quantity: 1 CPT4 Code: Description: ICD-10 Diagnosis Description S81.802A Unspecified open wound, left lower leg, initial encounter L97.828 Non-pressure chronic ulcer of other part of left lower leg with other specifi L03.116 Cellulitis of left lower limb I48.0 Paroxysmal  atrial fibrillation Modifier: ed severity Quantity: Electronic Signature(s) Signed:  08/26/2019 9:03:29 AM By: Lenda Kelp PA-C Signed: 08/26/2019 11:52:59 AM By: [name], [name], RN, [name], [name] RN, [name] Previous Signature: 08/23/2019 9:10:19 AM Version By: Lenda Kelp PA-C Entered By: [name], [name], RN, [name], [name] on 08/23/2019 09:13:03

## 2019-08-26 NOTE — Progress Notes (Signed)
Allan, Larena B. ([id number]) Visit Report for 08/23/2019 Fall Risk Assessment Details Patient Name: Franken, Jacayla B. Date of Service: 08/23/2019 8:30 AM Medical Record Number: [id number] Patient Account Number: [id number] Date of Birth/Sex: Sep 14, 1940 (79 y.o. F) Treating RN: [name] Primary Care Dane Bloch: [name] Other Clinician: Referring Ralynn San: [name] Treating Alton Bouknight/Extender: [name], [name] Weeks in Treatment: 20 Fall Risk Assessment Items Have you had 2 or more falls in the last 12 monthso 0 No Have you had any fall that resulted in injury in the last 12 monthso 0 No FALLS RISK SCREEN History of falling - immediate or within 3 months 25 Yes Secondary diagnosis (Do you have 2 or more medical diagnoseso) 0 No Ambulatory aid None/bed rest/wheelchair/nurse 0 No Crutches/cane/walker 15 Yes Furniture 0 No Intravenous therapy Access/Saline/Heparin Lock 0 No Gait/Transferring Normal/ bed rest/ wheelchair 0 No Weak (short steps with or without shuffle, stooped but able to lift head while walking, may seek 10 Yes support from furniture) Impaired (short steps with shuffle, may have difficulty arising from chair, head down, impaired 0 No balance) Mental Status Oriented to own ability 0 Yes Electronic Signature(s) Signed: 08/26/2019 11:52:59 AM By: [name], [name], RN, [name], [name] RN, [name] Entered By: [name], [name], RN, [name], [name] on 08/23/2019 08:58:30

## 2019-08-30 ENCOUNTER — Other Ambulatory Visit: Payer: Self-pay

## 2019-08-30 ENCOUNTER — Encounter: Payer: Medicare Other | Admitting: Physician Assistant

## 2019-08-30 DIAGNOSIS — L97828 Non-pressure chronic ulcer of other part of left lower leg with other specified severity: Secondary | ICD-10-CM | POA: Diagnosis not present

## 2019-08-30 NOTE — Progress Notes (Addendum)
[name], [name] ([id number]) Visit Report for 08/30/2019 Chief Complaint Document Details Patient Name: [name], [name] Date of Service: 08/30/2019 10:45 AM Medical Record Number: [id number] Patient Account Number: [id number] Date of Birth/Sex: 16-Mar-1941 (79 y.o. F) Treating RN: [name] Primary Care Provider: [name] Other Clinician: Referring Provider: [name] Treating Provider/Extender: [name], [name] Weeks in Treatment: 12 Information Obtained from: Patient Chief Complaint Left posterior LE ulcer Electronic Signature(s) Signed: 08/30/2019 10:55:24 AM By: Lenda Kelp PA-C Entered By: Lenda Kelp on 08/30/2019 10:55:24 [place], [place] B. ([id number]) -------------------------------------------------------------------------------- HPI Details Patient Name: [name], [name] Date of Service: 08/30/2019 10:45 AM Medical Record Number: [id number] Patient Account Number: [id number] Date of Birth/Sex: [birth year] (79 y.o. F) Treating RN: [name] Primary Care Provider: [name] Other Clinician: Referring Provider: [name] Treating Provider/Extender: [name], [name] Weeks in Treatment: 12 History of Present Illness HPI Description: 06/07/2019 upon evaluation today patient presents today for initial evaluation in our clinic concerning issues she is having with her left lower extremity. She tells me that she hit her leg on a rocking chair when she was getting up and this was roughly at the beginning of December. Dr. Hyacinth Meeker who is her primary care provider has been helping with treating this area since that time. The patient states unfortunately this just is not getting any better. He is attempted salt water soaks as well as using antibiotic ointments topically unfortunately she has not noted much improvement. He wanted her to come to the wound center sooner but she declined although when things did not seem to be getting better she finally consented to this. Currently the  patient does appear to have cellulitis of left lower extremity she also has atrial fibrillation noted for which she is on Eliquis this is probably part of the reason that she developed a large hematoma that she did. Subsequently she also has hypertension as well as congestive heart failure. The patient does not appear to be febrile today and really is doing fairly well as far as that is concerned although the leg is warm to touch pretty much throughout. No fevers, chills, nausea, vomiting, or diarrhea. She has not been on any oral antibiotics. 06/28/2019 patient actually is seen today in follow-up for her lower extremity ulcer. She is status post having actually spent 9 days in the hospital following when I sent her over for evaluation. They did have to keep her in the hospital and treat her with IV vancomycin secondary to the fact that she unfortunately had a bacteria present that obviously can be managed with oral medication with her penicillin allergy. Nonetheless her leg appears to be doing tremendously better today. There is no signs of active infection and overall very pleased with how things are progressing. I do believe that the wound is also measuring better along with looking much less irritated overall I think she is going to do quite well at this point. 07/05/2019 on evaluation today patient actually appears to be doing quite well with regard to her wound which is measuring smaller and still appears to be doing tremendously better compared to prior evaluation. I do believe the Melburn Popper has been of benefit for her. 07/12/19 patient's wound actually appears to be doing excellent at this time in regard to the left posterior lower extremity. She has been tolerating the dressing changes without complication. Fortunately there is much improvement noted here there is some slough noted she has been using Santyl I think that still appropriate. With  that being said I am going to go ahead and perform sharp  debridement today to clear away some of the necrotic material as well. 07/19/2019 upon evaluation today patient actually appears to be doing quite well in regard to her leg ulcer. She has been using Santyl and has been doing excellent with this up to this point. However I think based on what I am seeing today we can likely proceed to doing something else, collagen, to help with getting this wound to heal more effectively and quickly. 07/26/2019 upon evaluation today patient appears to be doing excellent in regard to her lower extremity ulcer. She has been tolerating the dressing changes without complication. Fortunately the wound is continuing to measure smaller and does seem to be progressing quite nicely. I am very pleased with how things are going at this point. The patient likewise is extremely happy. 08/02/2019 upon evaluation today patient appears to be healing quite nicely and is making excellent progress at this point. Fortunately there is no evidence of active infection at this time which is good news. No fever chills noted 08/09/2019 on evaluation today patient's wound actually is continuing to show signs of excellent improvement. Is measuring much smaller this week even compared to last week and overall I am extremely pleased with the progress. Fortunately there is no evidence of active infection at this time. 08/23/2019 upon evaluation today patient appears to be doing well with regard to her original wound she is showing some signs of improvement here which is great news. Fortunately there is no signs of active infection at this time. No fevers, chills, nausea, vomiting, or diarrhea. 08/30/2019 upon evaluation today patient actually appears to be completely healed. She has little bit of dry skin at the location where the wound was but overall she has done extremely well. There is no signs of active infection at this time which is great news. Electronic Signature(s) Signed: 08/30/2019 12:49:23 PM  By: Worthy Keeler PA-C Entered By: Worthy Keeler on 08/30/2019 12:49:23 [place], [place]. ([id number]) -------------------------------------------------------------------------------- Physical Exam Details Patient Name: [name], [name] Date of Service: 08/30/2019 10:45 AM Medical Record Number: [id number] Patient Account Number: [id number] Date of Birth/Sex: 1941/03/17 (79 y.o. F) Treating RN: Montey Hora Primary Care Provider: Emily Filbert Other Clinician: Referring Provider: Emily Filbert Treating Provider/Extender: Melburn Hake, [name] Weeks in Treatment: 76 Constitutional Well-nourished and well-hydrated in no acute distress. Respiratory normal breathing without difficulty. Psychiatric this patient is able to make decisions and demonstrates good insight into disease process. Alert and Oriented x 3. pleasant and cooperative. Notes Patient's wound bed currently showed signs of good epithelization at this point. There does not appear to be anything showing signs of infection or anything open at this time which is excellent news. Overall I think she is completely healed. Electronic Signature(s) Signed: 08/30/2019 12:49:44 PM By: Worthy Keeler PA-C Entered By: Worthy Keeler on 08/30/2019 12:49:44 Decelles, Georgean B. ([id number]) -------------------------------------------------------------------------------- Physician Orders Details Patient Name: [name], [name] Date of Service: 08/30/2019 10:45 AM Medical Record Number: [id number] Patient Account Number: [id number] Date of Birth/Sex: [birth year] (79 y.o. F) Treating RN: Montey Hora Primary Care Provider: Emily Filbert Other Clinician: Referring Provider: Emily Filbert Treating Provider/Extender: Melburn Hake, [name] Weeks in Treatment: 12 Verbal / Phone Orders: No Diagnosis Coding ICD-10 Coding Code Description [phone] Unspecified open wound, left lower leg, initial encounter L97.828 Non-pressure chronic ulcer of other part of left lower leg with  other specified severity L03.116 Cellulitis of left lower limb  I48.0 Paroxysmal atrial fibrillation I10 Essential (primary) hypertension I50.42 Chronic combined systolic (congestive) and diastolic (congestive) heart failure Z79.01 Long term (current) use of anticoagulants [facility] Center Electronic Signature(s) Signed: 08/30/2019 3:27:22 PM By: Curtis Sitesorthy, Joanna Signed: 08/30/2019 3:51:06 PM By: Lenda KelpStone III, [name] PA-C Entered By: Curtis Sitesorthy, Joanna on 08/30/2019 11:08:04 [place]asse, Cornella B. ([id number]) -------------------------------------------------------------------------------- Problem List Details Patient Name: [name], [name] Date of Service: 08/30/2019 10:45 AM Medical Record Number: [id number] Patient Account Number: [id number] Date of Birth/Sex: May 22, 1941 (79 y.o. F) Treating RN: Curtis Sitesorthy, Joanna Primary Care Provider: Bethann PunchesMiller, Mark Other Clinician: Referring Provider: Bethann PunchesMiller, Mark Treating Provider/Extender: Linwood DibblesSTONE III, [name] Weeks in Treatment: 12 Active Problems ICD-10 Evaluated Encounter Code Description Active Date Today Diagnosis S81.802A Unspecified open wound, left lower leg, initial encounter 06/07/2019 No Yes L97.828 Non-pressure chronic ulcer of other part of left lower leg with other 06/07/2019 No Yes specified severity L03.116 Cellulitis of left lower limb 06/07/2019 No Yes I48.0 Paroxysmal atrial fibrillation 06/07/2019 No Yes I10 Essential (primary) hypertension 06/07/2019 No Yes I50.42 Chronic combined systolic (congestive) and diastolic (congestive) heart 06/07/2019 No Yes failure Z79.01 Long term (current) use of anticoagulants 06/07/2019 No Yes Inactive Problems Resolved Problems Electronic Signature(s) Signed: 08/30/2019 10:55:16 AM By: Lenda KelpStone III, [name] PA-C Entered By: Lenda KelpStone III, [name] on 08/30/2019 10:55:15 [name], [name] ([id number]) -------------------------------------------------------------------------------- Progress Note  Details Patient Name: [name], [name] Date of Service: 08/30/2019 10:45 AM Medical Record Number: [id number] Patient Account Number: [id number] Date of Birth/Sex: May 22, 1941 (79 y.o. F) Treating RN: Curtis Sitesorthy, Joanna Primary Care Provider: Bethann PunchesMiller, Mark Other Clinician: Referring Provider: Bethann PunchesMiller, Mark Treating Provider/Extender: Linwood DibblesSTONE III, [name] Weeks in Treatment: 12 Subjective Chief Complaint Information obtained from Patient Left posterior LE ulcer History of Present Illness (HPI) 06/07/2019 upon evaluation today patient presents today for initial evaluation in our clinic concerning issues she is having with her left lower extremity. She tells me that she hit her leg on a rocking chair when she was getting up and this was roughly at the beginning of December. Dr. Hyacinth MeekerMiller who is her primary care provider has been helping with treating this area since that time. The patient states unfortunately this just is not getting any better. He is attempted salt water soaks as well as using antibiotic ointments topically unfortunately she has not noted much improvement. He wanted her to come to the wound center sooner but she declined although when things did not seem to be getting better she finally consented to this. Currently the patient does appear to have cellulitis of left lower extremity she also has atrial fibrillation noted for which she is on Eliquis this is probably part of the reason that she developed a large hematoma that she did. Subsequently she also has hypertension as well as congestive heart failure. The patient does not appear to be febrile today and really is doing fairly well as far as that is concerned although the leg is warm to touch pretty much throughout. No fevers, chills, nausea, vomiting, or diarrhea. She has not been on any oral antibiotics. 06/28/2019 patient actually is seen today in follow-up for her lower extremity ulcer. She is status post having actually spent 9 days in the  hospital following when I sent her over for evaluation. They did have to keep her in the hospital and treat her with IV vancomycin secondary to the fact that she unfortunately had a bacteria present that obviously can be managed with oral medication with her penicillin allergy. Nonetheless her  leg appears to be doing tremendously better today. There is no signs of active infection and overall very pleased with how things are progressing. I do believe that the wound is also measuring better along with looking much less irritated overall I think she is going to do quite well at this point. 07/05/2019 on evaluation today patient actually appears to be doing quite well with regard to her wound which is measuring smaller and still appears to be doing tremendously better compared to prior evaluation. I do believe the Melburn Popper has been of benefit for her. 07/12/19 patient's wound actually appears to be doing excellent at this time in regard to the left posterior lower extremity. She has been tolerating the dressing changes without complication. Fortunately there is much improvement noted here there is some slough noted she has been using Santyl I think that still appropriate. With that being said I am going to go ahead and perform sharp debridement today to clear away some of the necrotic material as well. 07/19/2019 upon evaluation today patient actually appears to be doing quite well in regard to her leg ulcer. She has been using Santyl and has been doing excellent with this up to this point. However I think based on what I am seeing today we can likely proceed to doing something else, collagen, to help with getting this wound to heal more effectively and quickly. 07/26/2019 upon evaluation today patient appears to be doing excellent in regard to her lower extremity ulcer. She has been tolerating the dressing changes without complication. Fortunately the wound is continuing to measure smaller and does seem to be  progressing quite nicely. I am very pleased with how things are going at this point. The patient likewise is extremely happy. 08/02/2019 upon evaluation today patient appears to be healing quite nicely and is making excellent progress at this point. Fortunately there is no evidence of active infection at this time which is good news. No fever chills noted 08/09/2019 on evaluation today patient's wound actually is continuing to show signs of excellent improvement. Is measuring much smaller this week even compared to last week and overall I am extremely pleased with the progress. Fortunately there is no evidence of active infection at this time. 08/23/2019 upon evaluation today patient appears to be doing well with regard to her original wound she is showing some signs of improvement here which is great news. Fortunately there is no signs of active infection at this time. No fevers, chills, nausea, vomiting, or diarrhea. 08/30/2019 upon evaluation today patient actually appears to be completely healed. She has little bit of dry skin at the location where the wound was but overall she has done extremely well. There is no signs of active infection at this time which is great news. [name], [name] ([id number]) Objective Constitutional Well-nourished and well-hydrated in no acute distress. Vitals Time Taken: 10:47 AM, Height: 66 in, Weight: 201 lbs, BMI: 32.4, Temperature: 98 F, Pulse: 72 bpm, Respiratory Rate: 15 breaths/min, Blood Pressure: 180/75 mmHg. Respiratory normal breathing without difficulty. Psychiatric this patient is able to make decisions and demonstrates good insight into disease process. Alert and Oriented x 3. pleasant and cooperative. General Notes: Patient's wound bed currently showed signs of good epithelization at this point. There does not appear to be anything showing signs of infection or anything open at this time which is excellent news. Overall I think she is completely  healed. Integumentary (Hair, Skin) Wound #1 status is Healed - Epithelialized. Original cause of wound  was Trauma. The wound is located on the Left,Posterior Lower Leg. The wound measures 0cm length x 0cm width x 0cm depth; 0cm^2 area and 0cm^3 volume. There is Fat Layer (Subcutaneous Tissue) Exposed exposed. There is a none present amount of drainage noted. The wound margin is flat and intact. There is no granulation within the wound bed. There is a small (1-33%) amount of necrotic tissue within the wound bed including Adherent Slough. Assessment Active Problems ICD-10 Unspecified open wound, left lower leg, initial encounter Non-pressure chronic ulcer of other part of left lower leg with other specified severity Cellulitis of left lower limb Paroxysmal atrial fibrillation Essential (primary) hypertension Chronic combined systolic (congestive) and diastolic (congestive) heart failure Long term (current) use of anticoagulants Plan Discharge From [facility]: Discharge from Wound Care Center 1, recommend currently that we go ahead and discontinue wound care services since she is completely healed obviously I think she is okay to take a shower also believe that she is fine to wash the area I would just recommend using her hand only nothing to scrub this region. 2. I am also can recommend she continue to elevate her legs and use her compression stockings will use Tubigrip over the next week and then transition to her compression stockings following. We will see her back for follow-up visit as needed. Electronic Signature(s) Signed: 08/30/2019 12:50:15 PM By: Lenda Kelp PA-C Entered By: Lenda Kelp on 08/30/2019 12:50:14 [name], [name] ([id number]) [name], [name] ([id number]) -------------------------------------------------------------------------------- SuperBill Details Patient Name: [name], [name] Date of Service: 08/30/2019 Medical Record Number: [id number] Patient Account Number:  [id number] Date of Birth/Sex: 1940/08/29 (79 y.o. F) Treating RN: [name] Primary Care Provider: [name] Other Clinician: Referring Provider: [name] Treating Provider/Extender: [name], [name] Weeks in Treatment: 12 Diagnosis Coding ICD-10 Codes Code Description [phone] Unspecified open wound, left lower leg, initial encounter L97.828 Non-pressure chronic ulcer of other part of left lower leg with other specified severity L03.116 Cellulitis of left lower limb I48.0 Paroxysmal atrial fibrillation I10 Essential (primary) hypertension I50.42 Chronic combined systolic (congestive) and diastolic (congestive) heart failure Z79.01 Long term (current) use of anticoagulants Facility Procedures CPT4 Code: 12878676 Description: 99213 - WOUND CARE VISIT-LEV 3 EST PT Modifier: Quantity: 1 Physician Procedures CPT4 Code: 7209470 Description: 99213 - WC PHYS LEVEL 3 - EST PT Modifier: Quantity: 1 CPT4 Code: Description: ICD-10 Diagnosis Description S81.802A Unspecified open wound, left lower leg, initial encounter L97.828 Non-pressure chronic ulcer of other part of left lower leg with other specifi L03.116 Cellulitis of left lower limb I48.0 Paroxysmal  atrial fibrillation Modifier: ed severity Quantity: Electronic Signature(s) Signed: 08/30/2019 12:50:35 PM By: Lenda Kelp PA-C Entered By: Lenda Kelp on 08/30/2019 12:50:35

## 2019-09-02 NOTE — Progress Notes (Signed)
[name], [name] ([id number]) Visit Report for 08/30/2019 Arrival Information Details Patient Name: [name], [name] Date of Service: 08/30/2019 10:45 AM Medical Record Number: [id number] Patient Account Number: [id number] Date of Birth/Sex: 1940-09-18 (79 y.o. F) Treating [name]: [name] Primary Care [name]: [name] Other Clinician: Referring [name]: [name] Treating [name]/Extender: [name], [name] in Treatment: 12 Visit Information History Since Last Visit Has Dressing in Place as Prescribed: Yes Patient Arrived: Ambulatory Pain Present Now: No Arrival Time: 10:44 Accompanied By: friend Transfer Assistance: Manual Patient Identification Verified: Yes Secondary Verification Process Completed: Yes Patient Requires Transmission-Based Precautions: No Patient Has Alerts: Yes Patient Alerts: 06/11/19 L TBI 0.74 Electronic Signature(s) Signed: 09/02/2019 8:41:26 AM By: [name], [name], [name], [name], [name], [name] Entered By: [name], [name], [name], [name], Ann on 08/30/2019 10:45:13 [name], [name] ([id number]) -------------------------------------------------------------------------------- Clinic Level of Care Assessment Details Patient Name: [name], [name] Date of Service: 08/30/2019 10:45 AM Medical Record Number: [id number] Patient Account Number: [id number] Date of Birth/Sex: [birth year] (79 y.o. F) Treating [name]: [name] Primary Care [name]: [name] Other Clinician: Referring [name]: [name] Treating [name]/Extender: [name], [name] in Treatment: 12 Clinic Level of Care Assessment Items TOOL 4 Quantity Score []  - Use when only an [name] is performed on FOLLOW-UP visit 0 ASSESSMENTS - Nursing Assessment / Reassessment X - Reassessment of Co-morbidities (includes updates in patient status) 1 10 X- 1 5 Reassessment of Adherence to Treatment Plan ASSESSMENTS - Wound and Skin Assessment / Reassessment X - Simple Wound Assessment / Reassessment - one wound 1 5 []  -  0 Complex Wound Assessment / Reassessment - multiple wounds []  - 0 Dermatologic / Skin Assessment (not related to wound area) ASSESSMENTS - Focused Assessment []  - Circumferential Edema Measurements - multi extremities 0 []  - 0 Nutritional Assessment / Counseling / Intervention X- 1 5 Lower Extremity Assessment (monofilament, tuning fork, pulses) []  - 0 Peripheral Arterial Disease Assessment (using hand held doppler) ASSESSMENTS - Ostomy and/or Continence Assessment and Care []  - Incontinence Assessment and Management 0 []  - 0 Ostomy Care Assessment and Management (repouching, etc.) PROCESS - Coordination of Care X - Simple Patient / Family Education for ongoing care 1 15 []  - 0 Complex (extensive) Patient / Family Education for ongoing care X- 1 10 Staff obtains , Records, Test Results / Process Orders []  - 0 Staff telephones HHA, Nursing Homes / Clarify orders / etc []  - 0 Routine Transfer to another Facility (non-emergent condition) []  - 0 Routine Hospital Admission (non-emergent condition) []  - 0 New Admissions / / Ordering NPWT, Apligraf, etc. []  - 0 Emergency Hospital Admission (emergent condition) X- 1 10 Simple Discharge Coordination []  - 0 Complex (extensive) Discharge Coordination PROCESS - Special Needs []  - Pediatric / Minor Patient Management 0 []  - 0 Isolation Patient Management []  - 0 Hearing / Language / Visual special needs []  - 0 Assessment of Community assistance (transportation, D/C planning, etc.) [name], [name] ( ) []  - 0 Additional assistance / Altered mentation []  - 0 Support Surface(s) Assessment (bed, cushion, seat, etc.) INTERVENTIONS - Wound Cleansing / Measurement X - Simple Wound Cleansing - one wound 1 5 []  - 0 Complex Wound Cleansing - multiple wounds X- 1 5 Wound Imaging (photographs - any number of wounds) []  - 0 Wound Tracing (instead of photographs) X- 1 5 Simple Wound Measurement -  one wound []  - 0 Complex Wound Measurement - multiple wounds INTERVENTIONS - Wound Dressings []  - Small Wound Dressing  one or multiple wounds 0 []  - 0 Medium Wound Dressing one or multiple wounds []  - 0 Large Wound Dressing one or multiple wounds []  - 0 Application of Medications - topical []  - 0 Application of Medications - injection INTERVENTIONS - Miscellaneous []  - External ear exam 0 []  - 0 Specimen Collection (cultures, biopsies, blood, body fluids, etc.) []  - 0 Specimen(s) / Culture(s) sent or taken to Lab for analysis []  - 0 Patient Transfer (multiple staff / / Similar devices) []  - 0 Simple Staple / Suture removal (25 or less) []  - 0 Complex Staple / Suture removal (26 or more) []  - 0 Hypo / Hyperglycemic Management (close monitor of Blood Glucose) []  - 0 Ankle / Brachial Index (ABI) - do not check if billed separately X- 1 5 Vital Signs Has the patient been seen at the hospital within the last three years: Yes Total Score: 80 Level Of Care: New/Established - Level 3 Electronic Signature(s) Signed: 08/30/2019 3:27:22 PM By: Entered By: on 08/30/2019 11:08:25 [name], [name] ( ) -------------------------------------------------------------------------------- Encounter Discharge Information Details Patient Name: [name], [name] Date of Service: 08/30/2019 10:45 AM Medical Record Number: Patient Account Number: Nurse, adult Date of Birth/Sex: [birth year] (79 y.o. F) Treating [name]: Primary Care Tiphanie Vo: Other Clinician: Referring Ivah Girardot: 10/30/2019 Treating Analysa Nutting/Extender: [name], [name] in Treatment: 12 Encounter Discharge Information Items Discharge Condition: Stable Ambulatory Status: Walker Discharge Destination: Home Transportation: Private Auto Accompanied By: caregiver Schedule Follow-up Appointment: No Clinical Summary of Care: Electronic Signature(s) Signed:  08/30/2019 3:27:22 PM By: 10/30/2019 Entered By: [id number] on 08/30/2019 11:09:09 [name], [name] ([id number]) -------------------------------------------------------------------------------- Lower Extremity Assessment Details Patient Name: Reznick, Johnesha B. Date of Service: 08/30/2019 10:45 AM Medical Record Number: [id number]/21/[id number] Patient Account Number: [id number] Date of Birth/Sex: 1941/03/02 (79 y.o. F) Treating [name]: [name] Primary Care Shahan Starks: [name] Other Clinician: Referring Yvette Loveless: 10/30/2019 Treating Yitzhak Awan/Extender: [name], [name] in Treatment: 12 Edema Assessment Assessed: [Left: No] [Right: No] [Left: Edema] [Right: :] Calf Left: Right: Point of Measurement: 25 cm From Medial Instep 32.5 cm cm Ankle Left: Right: Point of Measurement: 13 cm From Medial Instep 23 cm cm Vascular Assessment Pulses: Dorsalis Pedis Palpable: [Left:Yes] Electronic Signature(s) Signed: 09/02/2019 8:41:26 AM By: 10/30/2019, [name], [name], [name], [name], [name] Entered By: [id number], [name], [name], [name], Ann on 08/30/2019 10:52:43 [name], [name] ([id number]) -------------------------------------------------------------------------------- Multi Wound Chart Details Patient Name: Voit, Stavroula B. Date of Service: 08/30/2019 10:45 AM Medical Record Number: [id number]/21/[id number] Patient Account Number: [id number] Date of Birth/Sex: 09-Apr-1941 (79 y.o. F) Treating [name]: [name] Primary Care Laquisha Northcraft: [name] Other Clinician: Referring Jona Erkkila: 11/02/2019 Treating Hiroyuki Ozanich/Extender: [name], [name] in Treatment: 12 Vital Signs Height(in): 66 Pulse(bpm): 72 Weight(lbs): 201 Blood Pressure(mmHg): 180/75 Body Mass Index(BMI): 32 Temperature(F): 98 Respiratory Rate(breaths/min): 15 Photos: [N/A:N/A] Wound Location: Left, Posterior Lower Leg N/A N/A Wounding Event: Trauma N/A N/A Primary Etiology: Trauma, Other N/A N/A Comorbid History: Cataracts, Arrhythmia, Congestive N/A N/A Heart Failure,  Hypertension, Gout Date Acquired: 04/23/2019 N/A N/A [name] of Treatment: 12 N/A N/A Wound Status: Healed - Epithelialized N/A N/A Measurements L x W x D (cm) 0x0x0 N/A N/A Area (cm) : 0 N/A N/A Volume (cm) : 0 N/A N/A % Reduction in Area: 100.00% N/A N/A % Reduction in Volume: 100.00% N/A N/A Classification: Full Thickness Without Exposed N/A N/A Support Structures Exudate Amount: None Present N/A N/A Wound Margin: Flat and Intact N/A  N/A Granulation Amount: None Present (0%) N/A N/A Necrotic Amount: Small (1-33%) N/A N/A Exposed Structures: Fat Layer (Subcutaneous Tissue) N/A N/A Exposed: Yes Fascia: No Tendon: No Muscle: No Joint: No Bone: No Epithelialization: Large (67-100%) N/A N/A Treatment Notes Electronic Signature(s) Signed: 08/30/2019 3:27:22 PM By: [name] Entered By: [name] on 08/30/2019 11:07:49 [name], [name] ([id number]) -------------------------------------------------------------------------------- Multi-Disciplinary Care Plan Details Patient Name: Bathe, Jewelianna B. Date of Service: 08/30/2019 10:45 AM Medical Record Number: [id number] Patient Account Number: [id number] Date of Birth/Sex: 1941/02/18 (79 y.o. F) Treating [name]: [name] Primary Care Ayona Yniguez: [name] Other Clinician: Referring Azya Barbero: [name] Treating Jody Silas/Extender: [name], [name] in Treatment: 12 Active Inactive Electronic Signature(s) Signed: 08/30/2019 3:27:22 PM By: [name] Entered By: [name] on 08/30/2019 11:07:35 [name], [name] ([id number]) -------------------------------------------------------------------------------- Pain Assessment Details Patient Name: [name], [name] Date of Service: 08/30/2019 10:45 AM Medical Record Number: [id number] Patient Account Number: [id number] Date of Birth/Sex: Sep 28, 1940 (79 y.o. F) Treating [name]: [name] Primary Care Ferdinando Lodge: [name] Other Clinician: Referring Tishanna Dunford: [name] Treating  Tamarah Bhullar/Extender: [name], [name] in Treatment: 12 Active Problems Location of Pain Severity and Description of Pain Patient Has Paino No Site Locations Pain Management and Medication Current Pain Management: Notes Patient denies pain at this time. Electronic Signature(s) Signed: 09/02/2019 8:41:26 AM By: [name], [name], [name], [name], [name], [name] Entered By: [name], [name], [name], [name], Ann on 08/30/2019 10:45:31 [name], [name] ([id number]) -------------------------------------------------------------------------------- Patient/Caregiver Education Details Patient Name: [name], [name] Date of Service: 08/30/2019 10:45 AM Medical Record Number: [id number] Patient Account Number: [id number] Date of Birth/Gender: Aug 17, 1940 (79 y.o. F) Treating [name]: [name] Primary Care Physician: [name] Other Clinician: Referring Physician: Bethann [name] Treating Physician/Extender: Skeet Simmer in Treatment: 12 Education Assessment Education Provided To: Patient and Caregiver Education Topics Provided Basic Hygiene: Handouts: Other: scar care Methods: Explain/Verbal Responses: State content correctly Electronic Signature(s) Signed: 08/30/2019 3:27:22 PM By: [name] Entered By: [name] on 08/30/2019 11:08:43 [name], [name] ([id number]) -------------------------------------------------------------------------------- Wound Assessment Details Patient Name: [name], [name] Date of Service: 08/30/2019 10:45 AM Medical Record Number: [id number] Patient Account Number: [id number] Date of Birth/Sex: 1941/03/22 (79 y.o. F) Treating [name]: [name] Primary Care Aeisha Minarik: [name] Other Clinician: Referring Merle Cirelli: [name] Treating Abid Bolla/Extender: [name], [name] in Treatment: 12 Wound Status Wound Number: 1 Primary Trauma, Other Etiology: Wound Location: Left, Posterior Lower Leg Wound Status: Healed - Epithelialized Wounding Event: Trauma Comorbid Cataracts,  Arrhythmia, Congestive Heart Failure, Date Acquired: 04/23/2019 History: Hypertension, Gout [name] Of Treatment: 12 Clustered Wound: No Photos Wound Measurements Length: (cm) Width: (cm) Depth: (cm) Area: (cm) Volume: (cm) 0 % Reduction in Area: 100% 0 % Reduction in Volume: 100% 0 Epithelialization: Large (67-100%) 0 0 Wound Description Classification: Full Thickness Without Exposed Support Structures Wound Margin: Flat and Intact Exudate Amount: None Present Foul Odor After Cleansing: No Slough/Fibrino Yes Wound Bed Granulation Amount: None Present (0%) Exposed Structure Necrotic Amount: Small (1-33%) Fascia Exposed: No Necrotic Quality: Adherent Slough Fat Layer (Subcutaneous Tissue) Exposed: Yes Tendon Exposed: No Muscle Exposed: No Joint Exposed: No Bone Exposed: No Electronic Signature(s) Signed: 08/30/2019 3:27:22 PM By: [name] Entered By: [name] on 08/30/2019 11:07:19 [name], Peggi B. ([id number]) -------------------------------------------------------------------------------- Vitals Details Patient Name: Ducksworth, Halayna B. Date of Service: 08/30/2019 10:45 AM Medical Record Number: [id number] Patient Account Number: [id number] Date of Birth/Sex: September 03, 1940 (79 y.o. F) Treating [name]: [name] Primary Care Jaleyah Longhi: [name] Other Clinician: Referring  Zaylen Susman: Emily Filbert Treating Cherylynn Liszewski/Extender: Melburn Hake, [name] in Treatment: 12 Vital Signs Time Taken: 10:47 Temperature (F): 98 Height (in): 66 Pulse (bpm): 72 Weight (lbs): 201 Respiratory Rate (breaths/min): 15 Body Mass Index (BMI): 32.4 Blood Pressure (mmHg): 180/75 Reference Range: 80 - 120 mg / dl Electronic Signature(s) Signed: 09/02/2019 8:41:26 AM By: Gretta Cool, [name], [name], [name], [name], [name] Entered By: Gretta Cool, [name], [name], [name], Ann on 08/30/2019 10:49:23

## 2019-09-03 ENCOUNTER — Telehealth: Payer: Self-pay | Admitting: Cardiovascular Disease

## 2019-09-03 NOTE — Telephone Encounter (Signed)
Spoke with patient and she was calling due to heart rate 91 and BP 150/90. She states that when her heart rate got up to 91 took a nerve pill and then laid down and fell asleep. Since then she has not had another episode. Recommended that she continue monitoring and if these episodes increase in frequency to please give [place] a call back but we will go ahead and schedule her for overdue office visit here in the office. She verbalized understanding of our conversation, agreement with plan, and had no further questions at this time.

## 2019-09-03 NOTE — Telephone Encounter (Signed)
STAT if HR is under 50 or over 120 (normal HR is 60-100 beats per minute)  1) What is your heart rate? Now 91  2) Do you have a log of your heart rate readings (document readings)? Yesterday 76-104 . Patient is worried about afib  3) Do you have any other symptoms? BP 150/90 HR 91 Feels like her heart is beating "hard" States she fell 2 weeks ago, home health has assessed her with this. Currently has PT when she fell .
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
## 2019-09-24 ENCOUNTER — Ambulatory Visit (INDEPENDENT_AMBULATORY_CARE_PROVIDER_SITE_OTHER): Payer: Medicare Other | Admitting: Cardiovascular Disease

## 2019-09-24 ENCOUNTER — Encounter: Payer: Self-pay | Admitting: Cardiovascular Disease

## 2019-09-24 ENCOUNTER — Other Ambulatory Visit: Payer: Self-pay

## 2019-09-24 VITALS — BP 124/60 | HR 75 | Ht 66.0 in | Wt 195.0 lb

## 2019-09-24 DIAGNOSIS — I5032 Chronic diastolic (congestive) heart failure: Secondary | ICD-10-CM | POA: Diagnosis not present

## 2019-09-24 DIAGNOSIS — R002 Palpitations: Secondary | ICD-10-CM | POA: Diagnosis not present

## 2019-09-24 DIAGNOSIS — I4891 Unspecified atrial fibrillation: Secondary | ICD-10-CM | POA: Diagnosis not present

## 2019-09-24 DIAGNOSIS — I10 Essential (primary) hypertension: Secondary | ICD-10-CM

## 2019-09-24 MED ORDER — METOPROLOL TARTRATE 25 MG PO TABS: 12.5000 mg | ORAL_TABLET | Freq: Two times a day (BID) | ORAL | 3 refills | Status: DC

## 2019-09-24 NOTE — Patient Instructions (Addendum)
Medication Instructions:  Please start metoprolol tartrate 12.5 mg every morning For breakthrough tachycardia, take another metoprolol 12.5    If you need a refill on your cardiac medications before your next appointment, please call your pharmacy.    Lab work: No new labs needed   If you have labs (blood work) drawn today and your tests are completely normal, you will receive your results only by: Marland Kitchen MyChart Message (if you have MyChart) OR . A paper copy in the mail If you have any lab test that is abnormal or we need to change your treatment, we will call you to review the results.   Testing/Procedures: No new testing needed   Follow-Up: At [facility], you and your health needs are our priority.  As part of our continuing mission to provide you with exceptional heart care, we have created designated Provider Care Teams.  These Care Teams include your primary Cardiologist (physician) and Advanced Practice Providers (APPs -  Physician Assistants and Nurse Practitioners) who all work together to provide you with the care you need, when you need it.  . You will need a follow up appointment in 6 months  . Providers on your designated Care Team:   . Nicolasa Ducking, NP . Eula Listen, PA-C . Marisue Ivan, PA-C  Any Other Special Instructions Will Be Listed Below (If Applicable).  For educational health videos Log in to : [email or web address] Or : [email or web address], password : [email or web address]

## 2019-10-11 ENCOUNTER — Telehealth: Payer: Self-pay | Admitting: Cardiovascular Disease

## 2019-10-11 NOTE — Telephone Encounter (Signed)
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

## 2019-10-11 NOTE — Telephone Encounter (Signed)
Spoke with patient and she states that she does have ulcer to her foot. She went today to see her podiatrist and due to this they brought someone in to do an ultrasound from [facility]. She states they would monitor and scheduled her appointment. 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 She has return call from provider on Monday and recommended that she please write down a list of her questions so she can be prepared. I will send to provider for any recommendations of someone here within [facility] network for this. She was appreciative with no further questions at this time.

## 2019-10-14 NOTE — Telephone Encounter (Signed)
Called [facility] and spoke with [place]. She states that they were consulted by patients foot doctor and the report is not done at this time. Requested that they fax it to [place] but she is requesting signed release form by patient. Will reach out to her for this.

## 2019-10-14 NOTE — Telephone Encounter (Signed)
Patient returning call  States that they will not accept her request verbally - we need to have a signed request  Please advise if there is another way

## 2019-10-14 NOTE — Telephone Encounter (Signed)
Can we start by getting the ultrasound that was done so we can review the results it is not in the computer system If it was ankle-brachial indexes  we may need to perform lower extremity arterial Doppler here in the office to confirm

## 2019-10-14 NOTE — Telephone Encounter (Signed)
Called and spoke with patient requesting that she please have her provider fax [place] the report because we are not able to see it in our charting system. Provided her with our fax number and she verbalized understanding with no further questions at this time.

## 2019-10-14 NOTE — Telephone Encounter (Signed)
Spoke with patient and reviewed that we would in fact need release form in order to get the report. She provided me with number to fax release form and she will sign and then fax back to [place]. Let her know that I would continue working on this. She was appreciative with no further questions at this time.

## 2019-10-15 NOTE — Telephone Encounter (Signed)
Release form faxed to number provided.

## 2019-10-29 ENCOUNTER — Telehealth: Payer: Self-pay | Admitting: Cardiovascular Disease

## 2019-10-29 NOTE — Telephone Encounter (Signed)
Spoke with patient and reviewed report which we received from [facility]. She denies any problems or discomfort and also reviewed this information with her primary care provider. She states that Dr. Darene Lamer called her and they cancelled her procedure. Let her know that I would have report scanned into her chart and that Dr. Mariah Milling also reviewed it as well and did not see anything concerning at this time. She verbalized understanding of our conversation with no further questions at this time.

## 2019-10-29 NOTE — Telephone Encounter (Signed)
Patient calling in regarding ultrasound and a blocked artery and the standing of the test  Please advise

## 2019-11-22 ENCOUNTER — Other Ambulatory Visit: Payer: Self-pay

## 2019-11-22 ENCOUNTER — Encounter: Payer: Medicare Other | Attending: Physician Assistant | Admitting: Physician Assistant

## 2019-11-22 DIAGNOSIS — I872 Venous insufficiency (chronic) (peripheral): Secondary | ICD-10-CM | POA: Insufficient documentation

## 2019-11-22 DIAGNOSIS — I5042 Chronic combined systolic (congestive) and diastolic (congestive) heart failure: Secondary | ICD-10-CM | POA: Diagnosis not present

## 2019-11-22 DIAGNOSIS — Z7901 Long term (current) use of anticoagulants: Secondary | ICD-10-CM | POA: Diagnosis not present

## 2019-11-22 DIAGNOSIS — L97828 Non-pressure chronic ulcer of other part of left lower leg with other specified severity: Secondary | ICD-10-CM | POA: Insufficient documentation

## 2019-11-22 DIAGNOSIS — I110 Hypertensive heart disease with heart failure: Secondary | ICD-10-CM | POA: Diagnosis not present

## 2019-11-22 DIAGNOSIS — L03116 Cellulitis of left lower limb: Secondary | ICD-10-CM | POA: Diagnosis not present

## 2019-11-22 DIAGNOSIS — W2203XA Walked into furniture, initial encounter: Secondary | ICD-10-CM | POA: Insufficient documentation

## 2019-11-22 DIAGNOSIS — I480 Paroxysmal atrial fibrillation: Secondary | ICD-10-CM | POA: Insufficient documentation

## 2019-11-22 DIAGNOSIS — S81802A Unspecified open wound, left lower leg, initial encounter: Secondary | ICD-10-CM | POA: Diagnosis present

## 2019-11-22 NOTE — Progress Notes (Signed)
Paczkowski, Kelli B. ([id number]) Visit Report for 11/22/2019 Allergy List Details Patient Name: [name], [name] Date of Service: 11/22/2019 9:15 AM Medical Record Number: [id number] Patient Account Number: [id number] Date of Birth/Sex: [birth year] (79 y.o. F) Treating RN: Rodell Perna Primary Care Laurynn Mccorvey: [name] Other Clinician: Referring Carl Bleecker: Referral, Self Treating Khamiyah Grefe/Extender: STONE III, [name] Weeks in Treatment: 0 Allergies Active Allergies penicillin amlodipine Vicodin Betadine Sulfa (Sulfonamide Antibiotics) Allergy Notes Electronic Signature(s) Signed: 11/22/2019 10:21:07 AM By: Rodell Perna Entered By: Rodell Perna on 11/22/2019 09:18:05 Stamant, Tonette B. ([id number]) -------------------------------------------------------------------------------- Arrival Information Details Patient Name: [name], [name] Date of Service: 11/22/2019 9:15 AM Medical Record Number: [id number] Patient Account Number: [id number] Date of Birth/Sex: [birth year] (79 y.o. F) Treating RN: Rodell Perna Primary Care Edouard Gikas: [name] Other Clinician: Referring Shaheem Pichon: Referral, Self Treating Jalena Vanderlinden/Extender: [name], [name] Weeks in Treatment: 0 Visit Information Patient Arrived: Ambulatory Arrival Time: 09:16 Accompanied By: friend Transfer Assistance: None Patient Identification Verified: Yes History Since Last Visit Added or deleted any medications: No Any new allergies or adverse reactions: No Had a fall or experienced change in activities of daily living that may affect risk of falls: No Signs or symptoms of abuse/neglect since last visito No Hospitalized since last visit: No Has Dressing in Place as Prescribed: Yes Electronic Signature(s) Signed: 11/22/2019 10:21:07 AM By: Rodell Perna Entered By: Rodell Perna on 11/22/2019 09:17:05 Kammerer, Shelisa B. ([id number]) -------------------------------------------------------------------------------- Clinic Level of Care Assessment  Details Patient Name: [name], [name] Date of Service: 11/22/2019 9:15 AM Medical Record Number: [id number] Patient Account Number: [id number] Date of Birth/Sex: 08/10/1940 (79 y.o. F) Treating RN: [name] Primary Care Atanacio Melnyk: [name] Other Clinician: Referring Lenee Franze: Referral, Self Treating Lylee Corrow/Extender: [name], [name] Weeks in Treatment: 0 Clinic Level of Care Assessment Items TOOL 1 Quantity Score []  - Use when EandM and Procedure is performed on INITIAL visit 0 ASSESSMENTS - Nursing Assessment / Reassessment X - General Physical Exam (combine w/ comprehensive assessment (listed just below) when performed on new 1 20 pt. evals) X- 1 25 Comprehensive Assessment (HX, ROS, Risk Assessments, Wounds Hx, etc.) ASSESSMENTS - Wound and Skin Assessment / Reassessment []  - Dermatologic / Skin Assessment (not related to wound area) 0 ASSESSMENTS - Ostomy and/or Continence Assessment and Care []  - Incontinence Assessment and Management 0 []  - 0 Ostomy Care Assessment and Management (repouching, etc.) PROCESS - Coordination of Care X - Simple Patient / Family Education for ongoing care 1 15 []  - 0 Complex (extensive) Patient / Family Education for ongoing care X- 1 10 Staff obtains , Records, Test Results / Process Orders []  - 0 Staff telephones HHA, Nursing Homes / Clarify orders / etc []  - 0 Routine Transfer to another Facility (non-emergent condition) []  - 0 Routine Hospital Admission (non-emergent condition) []  - 0 New Admissions / / Ordering NPWT, Apligraf, etc. []  - 0 Emergency Hospital Admission (emergent condition) PROCESS - Special Needs []  - Pediatric / Minor Patient Management 0 []  - 0 Isolation Patient Management []  - 0 Hearing / Language / Visual special needs []  - 0 Assessment of Community assistance (transportation, D/C planning, etc.) []  - 0 Additional assistance / Altered mentation []  - 0 Support Surface(s)  Assessment (bed, cushion, seat, etc.) INTERVENTIONS - Miscellaneous []  - External ear exam 0 []  - 0 Patient Transfer (multiple staff / / Similar devices) []  - 0 Simple Staple / Suture removal (25 or less) []  - 0 Complex Staple / Suture removal (26 or more) []  -  0 Hypo/Hyperglycemic Management (do not check if billed separately) X- 1 15 Ankle / Brachial Index (ABI) - do not check if billed separately Has the patient been seen at the hospital within the last three years: Yes Total Score: 85 Level Of Care: New/Established - Level 3 Sease, Dreana B. ([id number]) Electronic Signature(s) Signed: 11/22/2019 5:03:03 PM By: Elliot GurneyWoody, [name], RN, [name], [name] RN, [name] Entered By: Elliot GurneyWoody, [name], RN, [name], [name] on 11/22/2019 09:43:17 Koehne, Denaisha B. ([id number]) -------------------------------------------------------------------------------- Encounter Discharge Information Details Patient Name: [name], [name] Date of Service: 11/22/2019 9:15 AM Medical Record Number: [id number] Patient Account Number: [id number] Date of Birth/Sex: 1940/10/17 (79 y.o. F) Treating RN: Huel CoventryWoody, [name] Primary Care Asencion Guisinger: Bethann PunchesMiller, Mark Other Clinician: Referring Harley Fitzwater: Referral, Self Treating Katasha Riga/Extender: Linwood DibblesSTONE III, [name] Weeks in Treatment: 0 Encounter Discharge Information Items Post Procedure Vitals Discharge Condition: Stable Temperature (F): 97.8 Ambulatory Status: Ambulatory Pulse (bpm): 86 Discharge Destination: Home Respiratory Rate (breaths/min): 16 Transportation: Private Auto Blood Pressure (mmHg): 127/53 Accompanied By: friend Schedule Follow-up Appointment: Yes Clinical Summary of Care: Electronic Signature(s) Signed: 11/22/2019 5:03:03 PM By: Elliot GurneyWoody, [name], RN, [name], [name] RN, [name] Entered By: Elliot GurneyWoody, [name], RN, [name], [name] on 11/22/2019 09:45:31 Richens, Nguyen B. ([id number]) -------------------------------------------------------------------------------- Lower Extremity Assessment Details Patient Name: [name], Ann  B. Date of Service: 11/22/2019 9:15 AM Medical Record Number: [id number] Patient Account Number: [id number] Date of Birth/Sex: 1940/10/17 (79 y.o. F) Treating RN: Rodell PernaScott, Dajea Primary Care Dashay Giesler: Bethann PunchesMiller, Mark Other Clinician: Referring Elmer Merwin: Referral, Self Treating Iveth Heidemann/Extender: Linwood DibblesSTONE III, [name] Weeks in Treatment: 0 Edema Assessment Assessed: [Left: No] [Right: No] Edema: [Left: No] [Right: No] Calf Left: Right: Point of Measurement: 30 cm From Medial Instep 36 cm 38 cm Ankle Left: Right: Point of Measurement: 10 cm From Medial Instep 23 cm 23 cm Vascular Assessment Pulses: Dorsalis Pedis Palpable: [Left:Yes] [Right:Yes] Doppler Audible: [Left:Yes] Posterior Tibial Palpable: [Left:Yes] Doppler Audible: [Left:Yes] Popliteal Palpable: [Left:Yes] Blood Pressure: Brachial: [Left:106] Dorsalis Pedis: 120 Ankle: Posterior Tibial: 130 Ankle Brachial Index: [Left:1.23] Electronic Signature(s) Signed: 11/22/2019 10:21:07 AM By: Rodell PernaScott, Dajea Entered By: Rodell PernaScott, Dajea on 11/22/2019 09:27:50 Miao, Khaliyah B. ([id number]) -------------------------------------------------------------------------------- Multi Wound Chart Details Patient Name: [name], [name] Date of Service: 11/22/2019 9:15 AM Medical Record Number: [id number] Patient Account Number: [id number] Date of Birth/Sex: 1940/10/17 (79 y.o. F) Treating RN: Huel CoventryWoody, [name] Primary Care Charma Mocarski: Bethann PunchesMiller, Mark Other Clinician: Referring Jennaya Pogue: Referral, Self Treating Cashlyn Huguley/Extender: Linwood DibblesSTONE III, [name] Weeks in Treatment: 0 Vital Signs Height(in): 66 Pulse(bpm): 86 Weight(lbs): 192 Blood Pressure(mmHg): 127/53 Body Mass Index(BMI): 31 Temperature(F): 97.8 Respiratory Rate(breaths/min): 16 Photos: [N/A:N/A] Wound Location: Left, Anterior Lower Leg N/A N/A Wounding Event: Trauma N/A N/A Primary Etiology: Trauma, Other N/A N/A Comorbid History: Cataracts, Arrhythmia, Congestive N/A N/A Heart Failure, Hypertension,  Gout Date Acquired: 10/22/2019 N/A N/A Weeks of Treatment: 0 N/A N/A Wound Status: Open N/A N/A Measurements L x W x D (cm) 3.6x2.5x0.1 N/A N/A Area (cm) : 7.069 N/A N/A Volume (cm) : 0.707 N/A N/A Classification: Full Thickness Without Exposed N/A N/A Support Structures Exudate Amount: Medium N/A N/A Exudate Type: Serosanguineous N/A N/A Exudate Color: red, brown N/A N/A Wound Margin: Flat and Intact N/A N/A Granulation Amount: Small (1-33%) N/A N/A Granulation Quality: Red N/A N/A Necrotic Amount: Large (67-100%) N/A N/A Necrotic Tissue: Eschar, Adherent Slough N/A N/A Exposed Structures: Fat Layer (Subcutaneous Tissue) N/A N/A Exposed: Yes Fascia: No Tendon: No Muscle: No Joint: No Bone: No Epithelialization: None N/A N/A Treatment Notes Electronic Signature(s) Signed: 11/22/2019 5:03:03 PM By: Elliot GurneyWoody, [name], RN, [name],  Selena Batten RN, [name] Entered By: [name], [name], RN, [name], [name] on 11/22/2019 09:39:29 Aja, Mckenleigh B. ([id number]) -------------------------------------------------------------------------------- Multi-Disciplinary Care Plan Details Patient Name: [name], [name] Date of Service: 11/22/2019 9:15 AM Medical Record Number: [id number] Patient Account Number: [id number] Date of Birth/Sex: 1940-06-10 (79 y.o. F) Treating RN: [name] Primary Care Mallie Giambra: [name] Other Clinician: Referring Jabreel Chimento: Referral, Self Treating Josceline Chenard/Extender: [name], [name] Weeks in Treatment: 0 Active Inactive Abuse / Safety / Falls / Self Care Management Nursing Diagnoses: History of Falls Goals: Patient will remain injury free related to falls Date Initiated: 11/22/2019 Target Resolution Date: 11/29/2019 Goal Status: Active Interventions: Assess fall risk on admission and as needed Notes: Necrotic Tissue Nursing Diagnoses: Impaired tissue integrity related to necrotic/devitalized tissue Goals: Necrotic/devitalized tissue will be minimized in the wound bed Date Initiated:  11/22/2019 Target Resolution Date: 12/06/2019 Goal Status: Active Interventions: Provide education on necrotic tissue and debridement process Treatment Activities: Excisional debridement : 11/22/2019 Notes: Orientation to the Wound Care Program Nursing Diagnoses: Knowledge deficit related to the wound healing center program Goals: Patient/caregiver will verbalize understanding of the Wound Healing Center Program Date Initiated: 11/22/2019 Target Resolution Date: 11/22/2019 Goal Status: Active Interventions: Provide education on orientation to the wound center Notes: Pain, Acute or Chronic Nursing Diagnoses: Pain Management - Non-cyclic Acute (Procedural) Goals: Patient will verbalize adequate pain control and receive pain control interventions during procedures as needed Behe, Kiora B. ([id number]) Date Initiated: 11/22/2019 Target Resolution Date: 11/29/2019 Goal Status: Active Interventions: Complete pain assessment as per visit requirements Treatment Activities: Administer pain control measures as ordered : 11/22/2019 Notes: Wound/Skin Impairment Nursing Diagnoses: Impaired tissue integrity Goals: Ulcer/skin breakdown will have a volume reduction of 30% by week 4 Date Initiated: 11/22/2019 Target Resolution Date: 12/28/2019 Goal Status: Active Interventions: Assess ulceration(s) every visit Treatment Activities: Topical wound management initiated : 11/22/2019 Notes: Electronic Signature(s) Signed: 11/22/2019 5:03:03 PM By: [name], [name], RN, [name], [name] RN, [name] Entered By: [name], [name], RN, [name], [name] on 11/22/2019 09:38:35 [name], [name] ([id number]) -------------------------------------------------------------------------------- Pain Assessment Details Patient Name: [name], [name] Date of Service: 11/22/2019 9:15 AM Medical Record Number: [id number] Patient Account Number: [id number] Date of Birth/Sex: 09/15/1940 (79 y.o. F) Treating RN: Rodell Perna Primary Care Jennilee Demarco: [name] Other  Clinician: Referring Khamron Gellert: Referral, Self Treating Eleen Litz/Extender: [name], [name] Weeks in Treatment: 0 Active Problems Location of Pain Severity and Description of Pain Patient Has Paino No Site Locations Pain Management and Medication Current Pain Management: Electronic Signature(s) Signed: 11/22/2019 10:21:07 AM By: Rodell Perna Entered By: Rodell Perna on 11/22/2019 09:17:10 [name], [name] ([id number]) -------------------------------------------------------------------------------- Patient/Caregiver Education Details Patient Name: [name], [name] Date of Service: 11/22/2019 9:15 AM Medical Record Number: [id number] Patient Account Number: [id number] Date of Birth/Gender: 05-17-1941 (79 y.o. F) Treating RN: [name] Primary Care Physician: [name] Other Clinician: Referring Physician: Referral, Self Treating Physician/Extender: Skeet Simmer in Treatment: 0 Education Assessment Education Provided To: Patient Education Topics Provided Welcome To The Wound Care Center: Handouts: Welcome To The Wound Care Center Methods: Demonstration, Explain/Verbal Responses: State content correctly Wound Debridement: Handouts: Wound Debridement Methods: Demonstration, Explain/Verbal Responses: State content correctly Wound/Skin Impairment: Handouts: Caring for Your Ulcer Methods: Demonstration, Explain/Verbal Responses: State content correctly Electronic Signature(s) Signed: 11/22/2019 5:03:03 PM By: [name], [name], RN, [name], [name] RN, [name] Entered By: [name], [name], RN, [name], [name] on 11/22/2019 09:43:50 [name], [name] ([id number]) -------------------------------------------------------------------------------- Wound Assessment Details Patient Name: [name], Alanii B. Date of Service: 11/22/2019 9:15 AM Medical Record Number: [id number]  Patient Account Number: [id number] Date of Birth/Sex: April 04, 1941 (79 y.o. F) Treating RN: Rodell Perna Primary Care Terricka Onofrio: [name] Other  Clinician: Referring Tailynn Armetta: Referral, Self Treating Sulo Janczak/Extender: [name], [name] Weeks in Treatment: 0 Wound Status Wound Number: 2 Primary Trauma, Other Etiology: Wound Location: Left, Anterior Lower Leg Wound Status: Open Wounding Event: Trauma Comorbid Cataracts, Arrhythmia, Congestive Heart Failure, Date Acquired: 10/22/2019 History: Hypertension, Gout Weeks Of Treatment: 0 Clustered Wound: No Photos Wound Measurements Length: (cm) 3.6 Width: (cm) 2.5 Depth: (cm) 0.1 Area: (cm) 7.069 Volume: (cm) 0.707 % Reduction in Area: % Reduction in Volume: Epithelialization: None Tunneling: No Wound Description Classification: Full Thickness Without Exposed Support Structu Wound Margin: Flat and Intact Exudate Amount: Medium Exudate Type: Serosanguineous Exudate Color: red, brown res Foul Odor After Cleansing: No Slough/Fibrino Yes Wound Bed Granulation Amount: Small (1-33%) Exposed Structure Granulation Quality: Red Fascia Exposed: No Necrotic Amount: Large (67-100%) Fat Layer (Subcutaneous Tissue) Exposed: Yes Necrotic Quality: Eschar, Adherent Slough Tendon Exposed: No Muscle Exposed: No Joint Exposed: No Bone Exposed: No Treatment Notes Wound #2 (Left, Anterior Lower Leg) 1. Cleansed with: Clean wound with Normal Saline 2. Anesthetic Topical Lidocaine 4% cream to wound bed prior to debridement Stennis, Yusra B. ([id number]) 4. Dressing Applied: Other dressing (specify in notes) 5. Secondary Dressing Applied ABD Pad 7. Secured with 3 Layer Compression System - Left Lower Extremity Notes Silver cell Electronic Signature(s) Signed: 11/22/2019 10:21:07 AM By: Rodell Perna Entered By: Rodell Perna on 11/22/2019 09:22:55 Knipfer, Brylan B. ([id number]) -------------------------------------------------------------------------------- Vitals Details Patient Name: Slaubaugh, Luis B. Date of Service: 11/22/2019 9:15 AM Medical Record Number: [id number] Patient Account  Number: [id number] Date of Birth/Sex: 1941/03/28 (79 y.o. F) Treating RN: Rodell Perna Primary Care Tahlia Deamer: [name] Other Clinician: Referring Calyse Murcia: Referral, Self Treating Adrionna Delcid/Extender: [name], [name] Weeks in Treatment: 0 Vital Signs Time Taken: 09:17 Temperature (F): 97.8 Height (in): 66 Pulse (bpm): 86 Source: Stated Respiratory Rate (breaths/min): 16 Weight (lbs): 192 Blood Pressure (mmHg): 127/53 Source: Stated Reference Range: 80 - 120 mg / dl Body Mass Index (BMI): 31 Electronic Signature(s) Signed: 11/22/2019 10:21:07 AM By: Rodell Perna Entered By: Rodell Perna on 11/22/2019 09:17:57

## 2019-11-22 NOTE — Progress Notes (Signed)
Lichtenwalner, Zeniyah B. ([id number]) Visit Report for 11/22/2019 Abuse/Suicide Risk Screen Details Patient Name: [name], [name] Date of Service: 11/22/2019 9:15 AM Medical Record Number: [id number] Patient Account Number: [id number] Date of Birth/Sex: 1940-12-16 (79 y.o. F) Treating RN: Rodell Perna Primary Care Waris Rodger: [name] Other Clinician: Referring Jeremi Losito: Referral, Self Treating Tahjae Clausing/Extender: [name], [name] Weeks in Treatment: 0 Abuse/Suicide Risk Screen Items Answer ABUSE RISK SCREEN: Has anyone close to you tried to hurt or harm you recentlyo No Do you feel uncomfortable with anyone in your familyo No Has anyone forced you do things that you didnot want to doo No Electronic Signature(s) Signed: 11/22/2019 10:21:07 AM By: Rodell Perna Entered By: Rodell Perna on 11/22/2019 09:20:34 [name], [name] ([id number]) -------------------------------------------------------------------------------- Activities of Daily Living Details Patient Name: [name], [name] Date of Service: 11/22/2019 9:15 AM Medical Record Number: [id number] Patient Account Number: [id number] Date of Birth/Sex: June 23, 1940 (79 y.o. F) Treating RN: Rodell Perna Primary Care Estelene Carmack: [name] Other Clinician: Referring Takao Lizer: Referral, Self Treating Quinteria Chisum/Extender: [name], [name] Weeks in Treatment: 0 Activities of Daily Living Items Answer Activities of Daily Living (Please select one for each item) Drive Automobile Completely Able Take Medications Completely Able Use Telephone Completely Able Care for Appearance Completely Able Use Toilet Completely Able Bath / Shower Completely Able Dress Self Completely Able Feed Self Completely Able Walk Completely Able Get In / Out Bed Completely Able Housework Completely Able Prepare Meals Completely Able Handle Money Completely Able Shop for Self Completely Able Electronic Signature(s) Signed: 11/22/2019 10:21:07 AM By: Rodell Perna Entered By: Rodell Perna  on 11/22/2019 09:20:47 [name], [name] ([id number]) -------------------------------------------------------------------------------- Education Screening Details Patient Name: [name], [name] Date of Service: 11/22/2019 9:15 AM Medical Record Number: [id number] Patient Account Number: [id number] Date of Birth/Sex: [birth year] (79 y.o. F) Treating RN: Rodell Perna Primary Care Nusayba Cadenas: [name] Other Clinician: Referring Candi Profit: Referral, Self Treating Jamelle Noy/Extender: [name], [name] Weeks in Treatment: 0 Primary Learner Assessed: Patient Learning Preferences/Education Level/Primary Language Learning Preference: Explanation Highest Education Level: College or Above Preferred Language: English Cognitive Barrier Language Barrier: No Translator Needed: No Memory Deficit: No Emotional Barrier: No Cultural/Religious Beliefs Affecting Medical Care: No Physical Barrier Impaired Vision: No Impaired Hearing: No Decreased Hand dexterity: No Knowledge/Comprehension Knowledge Level: High Comprehension Level: High Ability to understand written instructions: High Ability to understand verbal instructions: High Motivation Anxiety Level: Calm Cooperation: Cooperative Education Importance: Acknowledges Need Interest in Health Problems: Asks Questions Perception: Coherent Willingness to Engage in Self-Management High Activities: Readiness to Engage in Self-Management High Activities: Electronic Signature(s) Signed: 11/22/2019 10:21:07 AM By: Rodell Perna Entered By: Rodell Perna on 11/22/2019 09:21:23 [name], [name] ([id number]) -------------------------------------------------------------------------------- Fall Risk Assessment Details Patient Name: [name], [name] Date of Service: 11/22/2019 9:15 AM Medical Record Number: [id number] Patient Account Number: [id number] Date of Birth/Sex: 08-Jan-1941 (79 y.o. F) Treating RN: Rodell Perna Primary Care Lylee Corrow: [name] Other  Clinician: Referring Wane Mollett: Referral, Self Treating Trany Chernick/Extender: [name], [name] Weeks in Treatment: 0 Fall Risk Assessment Items Have you had 2 or more falls in the last 12 monthso 0 Yes Have you had any fall that resulted in injury in the last 12 monthso 0 Yes FALLS RISK SCREEN History of falling - immediate or within 3 months 0 No Secondary diagnosis (Do you have 2 or more medical diagnoseso) 0 No Ambulatory aid None/bed rest/wheelchair/nurse 0 No Crutches/cane/walker 15 Yes Furniture 0 No Intravenous therapy Access/Saline/Heparin Lock 0 No Gait/Transferring Normal/ bed rest/ wheelchair 0 No Weak (short steps  with or without shuffle, stooped but able to lift head while walking, may 0 No seek support from furniture) Impaired (short steps with shuffle, may have difficulty arising from chair, head down, impaired 0 No balance) Mental Status Oriented to own ability 0 No Electronic Signature(s) Signed: 11/22/2019 10:21:07 AM By: Rodell Perna Entered By: Rodell Perna on 11/22/2019 09:28:26 [name], [name] ([id number]) -------------------------------------------------------------------------------- Foot Assessment Details Patient Name: [name], [name] Date of Service: 11/22/2019 9:15 AM Medical Record Number: [id number] Patient Account Number: [id number] Date of Birth/Sex: 1940-11-08 (79 y.o. F) Treating RN: Rodell Perna Primary Care Neri Samek: [name] Other Clinician: Referring Tiki Tucciarone: Referral, Self Treating Elizabethanne Lusher/Extender: [name], [name] Weeks in Treatment: 0 Foot Assessment Items Site Locations + = Sensation present, - = Sensation absent, C = Callus, U = Ulcer R = Redness, W = Warmth, M = Maceration, PU = Pre-ulcerative lesion F = Fissure, S = Swelling, D = Dryness Assessment Right: Left: Other Deformity: No No Prior Foot Ulcer: No No Prior Amputation: No No Charcot Joint: No No Ambulatory Status: Ambulatory Without Help Gait: Steady Electronic  Signature(s) Signed: 11/22/2019 10:21:07 AM By: Rodell Perna Entered By: Rodell Perna on 11/22/2019 09:29:36 [name], [name] ([id number]) -------------------------------------------------------------------------------- Nutrition Risk Screening Details Patient Name: [name], [name] Date of Service: 11/22/2019 9:15 AM Medical Record Number: [id number] Patient Account Number: [id number] Date of Birth/Sex: May 29, 1940 (79 y.o. F) Treating RN: Rodell Perna Primary Care Karla Pavone: [name] Other Clinician: Referring Aidel Davisson: Referral, Self Treating Adileny Delon/Extender: [name], [name] Weeks in Treatment: 0 Height (in): 66 Weight (lbs): 192 Body Mass Index (BMI): 31 Nutrition Risk Screening Items Score Screening NUTRITION RISK SCREEN: I have an illness or condition that made me change the kind and/or amount of food I eat 0 No I eat fewer than two meals per day 0 No I eat few fruits and vegetables, or milk products 0 No I have three or more drinks of beer, liquor or wine almost every day 0 No I have tooth or mouth problems that make it hard for me to eat 0 No I don't always have enough money to buy the food I need 0 No I eat alone most of the time 0 No I take three or more different prescribed or over-the-counter drugs a day 0 No Without wanting to, I have lost or gained 10 pounds in the last six months 0 No I am not always physically able to shop, cook and/or feed myself 0 No Nutrition Protocols Good Risk Protocol 0 No interventions needed Moderate Risk Protocol High Risk Proctocol Risk Level: Good Risk Score: 0 Electronic Signature(s) Signed: 11/22/2019 10:21:07 AM By: Rodell Perna Entered By: Rodell Perna on 11/22/2019 09:62:83

## 2019-11-22 NOTE — Progress Notes (Signed)
[name], [name] ([id number]) Visit Report for 11/22/2019 Chief Complaint Document Details Patient Name: [name], [name] Date of Service: 11/22/2019 9:15 AM Medical Record Number: [id number] Patient Account Number: [id number] Date of Birth/Sex: 1940/12/13 (79 y.o. F) Treating RN: [name] Primary Care Provider: [name] Other Clinician: Referring Provider: Referral, Self Treating Provider/Extender: [name], Nashley Cordoba Weeks in Treatment: 0 Information Obtained from: Patient Chief Complaint Left LE ulcer Electronic Signature(s) Signed: 11/22/2019 9:32:03 AM By: Lenda Kelp PA-C Entered By: Lenda Kelp on 11/22/2019 09:32:03 [name], [name] ([id number]) -------------------------------------------------------------------------------- Debridement Details Patient Name: Kluver, Cynthia B. Date of Service: 11/22/2019 9:15 AM Medical Record Number: [id number] Patient Account Number: [id number] Date of Birth/Sex: Dec 20, 1940 (79 y.o. F) Treating RN: [name] Primary Care Provider: [name] Other Clinician: Referring Provider: Referral, Self Treating Provider/Extender: [name], Jamarie Mussa Weeks in Treatment: 0 Debridement Performed for Wound #2 Left,Anterior Lower Leg Assessment: Performed By: Physician STONE III, Karmon Andis E., PA-C Debridement Type: Debridement Level of Consciousness (Pre- Awake and Alert procedure): Pre-procedure Verification/Time Out Yes - 09:35 Taken: Total Area Debrided (L x W): 3.6 (cm) x 2.5 (cm) = 9 (cm) Tissue and other material Viable, Non-Viable, Slough, Subcutaneous, Slough debrided: Level: Skin/Subcutaneous Tissue Debridement Description: Excisional Instrument: Curette Bleeding: Moderate Hemostasis Achieved: Silver Nitrate Response to Treatment: Procedure was tolerated well Level of Consciousness (Post- Awake and Alert procedure): Post Debridement Measurements of Total Wound Length: (cm) 3.6 Width: (cm) 2.5 Depth: (cm) 0.2 Volume: (cm) 1.414 Character of  Wound/Ulcer Post Debridement: Stable Post Procedure Diagnosis Same as Pre-procedure Electronic Signature(s) Signed: 11/22/2019 1:43:29 PM By: Lenda Kelp PA-C Signed: 11/22/2019 5:03:03 PM By: [name], [name], RN, [name], [name] RN, [name] Entered By: [name], [name], RN, [name], [name] on 11/22/2019 09:40:32 [name], [name] ([id number]) -------------------------------------------------------------------------------- HPI Details Patient Name: Beckers, Taliyah B. Date of Service: 11/22/2019 9:15 AM Medical Record Number: [id number] Patient Account Number: [id number] Date of Birth/Sex: 1940-07-19 (79 y.o. F) Treating RN: [name] Primary Care Provider: [name] Other Clinician: Referring Provider: Referral, Self Treating Provider/Extender: [name], Glee Lashomb Weeks in Treatment: 0 History of Present Illness HPI Description: 06/07/2019 upon evaluation today patient presents today for initial evaluation in our clinic concerning issues she is having with her left lower extremity. She tells me that she hit her leg on a rocking chair when she was getting up and this was roughly at the beginning of December. Dr. Hyacinth Meeker who is her primary care provider has been helping with treating this area since that time. The patient states unfortunately this just is not getting any better. He is attempted salt water soaks as well as using antibiotic ointments topically unfortunately she has not noted much improvement. He wanted her to come to the wound center sooner but she declined although when things did not seem to be getting better she finally consented to this. Currently the patient does appear to have cellulitis of left lower extremity she also has atrial fibrillation noted for which she is on Eliquis this is probably part of the reason that she developed a large hematoma that she did. Subsequently she also has hypertension as well as congestive heart failure. The patient does not appear to be febrile today and really is doing fairly well  as far as that is concerned although the leg is warm to touch pretty much throughout. No fevers, chills, nausea, vomiting, or diarrhea. She has not been on any oral antibiotics. 06/28/2019 patient actually is seen today in follow-up for her lower extremity ulcer. She  is status post having actually spent 9 days in the hospital following when I sent her over for evaluation. They did have to keep her in the hospital and treat her with IV vancomycin secondary to the fact that she unfortunately had a bacteria present that obviously can be managed with oral medication with her penicillin allergy. Nonetheless her leg appears to be doing tremendously better today. There is no signs of active infection and overall very pleased with how things are progressing. I do believe that the wound is also measuring better along with looking much less irritated overall I think she is going to do quite well at this point. 07/05/2019 on evaluation today patient actually appears to be doing quite well with regard to her wound which is measuring smaller and still appears to be doing tremendously better compared to prior evaluation. I do believe the Melburn Popper has been of benefit for her. 07/12/19 patient's wound actually appears to be doing excellent at this time in regard to the left posterior lower extremity. She has been tolerating the dressing changes without complication. Fortunately there is much improvement noted here there is some slough noted she has been using Santyl I think that still appropriate. With that being said I am going to go ahead and perform sharp debridement today to clear away some of the necrotic material as well. 07/19/2019 upon evaluation today patient actually appears to be doing quite well in regard to her leg ulcer. She has been using Santyl and has been doing excellent with this up to this point. However I think based on what I am seeing today we can likely proceed to doing something else, collagen, to  help with getting this wound to heal more effectively and quickly. 07/26/2019 upon evaluation today patient appears to be doing excellent in regard to her lower extremity ulcer. She has been tolerating the dressing changes without complication. Fortunately the wound is continuing to measure smaller and does seem to be progressing quite nicely. I am very pleased with how things are going at this point. The patient likewise is extremely happy. 08/02/2019 upon evaluation today patient appears to be healing quite nicely and is making excellent progress at this point. Fortunately there is no evidence of active infection at this time which is good news. No fever chills noted 08/09/2019 on evaluation today patient's wound actually is continuing to show signs of excellent improvement. Is measuring much smaller this week even compared to last week and overall I am extremely pleased with the progress. Fortunately there is no evidence of active infection at this time. 08/23/2019 upon evaluation today patient appears to be doing well with regard to her original wound she is showing some signs of improvement here which is great news. Fortunately there is no signs of active infection at this time. No fevers, chills, nausea, vomiting, or diarrhea. 08/30/2019 upon evaluation today patient actually appears to be completely healed. She has little bit of dry skin at the location where the wound was but overall she has done extremely well. There is no signs of active infection at this time which is great news. Readmission: 11/22/2019 upon evaluation today patient appears to be doing somewhat poorly in regard to her leg ulcer. She tells me unfortunately that she fell out of bed about 1 month ago and subsequently has been monitoring this since she initially had a hematoma and it sounds like they were trying to get this to reabsorb Dr. Hyacinth Meeker that is her primary care provider. With that  being said her podiatrist apparently tried to  open this up according to what the patient tells me today and then subsequently gave her an antibiotic for her toe and said that would also help with the area on her leg. They were not able to get anything to come out as her podiatrist stated that this seemed to be too thick in order to drain. With that being said the patient was eventually referred back here to the wound center for further management. Electronic Signature(s) Signed: 11/22/2019 3:29:35 PM By: Lenda Kelp PA-C Entered By: Lenda Kelp on 11/22/2019 15:29:34 [name], [name] ([id number]) -------------------------------------------------------------------------------- Physical Exam Details Patient Name: Robitaille, Shyah B. Date of Service: 11/22/2019 9:15 AM Medical Record Number: [id number] Patient Account Number: [id number] Date of Birth/Sex: [birth year] (79 y.o. F) Treating RN: [name] Primary Care Provider: [name] Other Clinician: Referring Provider: Referral, Self Treating Provider/Extender: [name], Mariska Daffin Weeks in Treatment: 0 Constitutional sitting or standing blood pressure is within target range for patient.. pulse regular and within target range for patient.Marland Kitchen respirations regular, non- labored and within target range for patient.Marland Kitchen temperature within target range for patient.. Well-nourished and well-hydrated in no acute distress. Eyes conjunctiva clear no eyelid edema noted. pupils equal round and reactive to light and accommodation. Ears, Nose, Mouth, and Throat no gross abnormality of ear auricles or external auditory canals. normal hearing noted during conversation. mucus membranes moist. Respiratory normal breathing without difficulty. Cardiovascular 2+ dorsalis pedis/posterior tibialis pulses. 1+ pitting edema of the bilateral lower extremities. Musculoskeletal normal gait and posture. no significant deformity or arthritic changes, no loss or range of motion, no clubbing. Psychiatric this patient is able  to make decisions and demonstrates good insight into disease process. Alert and Oriented x 3. pleasant and cooperative. Notes Upon inspection patient's wound bed actually showed signs of good granulation at this time. There does not appear to be any evidence of active infection which is good news. There is slough noted on the surface of the wound and this is going require sharp debridement today to clear away some of the necrotic debris at this point. I discussed this with the patient and she does want me to proceed with debridement today. Therefore I did actually perform sharp debridement to clear away some of the necrotic debris from the surface of the wound she tolerated this well today without complication post debridement the wound bed appears to be doing much better. Electronic Signature(s) Signed: 11/22/2019 3:30:06 PM By: Lenda Kelp PA-C Entered By: Lenda Kelp on 11/22/2019 15:30:06 [name], [name] ([id number]) -------------------------------------------------------------------------------- Physician Orders Details Patient Name: Minervini, Maia B. Date of Service: 11/22/2019 9:15 AM Medical Record Number: [id number] Patient Account Number: [id number] Date of Birth/Sex: [birth year] (79 y.o. F) Treating RN: [name] Primary Care Provider: [name] Other Clinician: Referring Provider: Referral, Self Treating Provider/Extender: [name], Shiva Karis Weeks in Treatment: 0 Verbal / Phone Orders: No Diagnosis Coding ICD-10 Coding Code Description S81.802A Unspecified open wound, left lower leg, initial encounter L97.828 Non-pressure chronic ulcer of other part of left lower leg with other specified severity L03.116 Cellulitis of left lower limb I48.0 Paroxysmal atrial fibrillation I10 Essential (primary) hypertension I50.42 Chronic combined systolic (congestive) and diastolic (congestive) heart failure Z79.01 Long term (current) use of anticoagulants Anesthetic (add to Medication  List) Wound #2 Left,Anterior Lower Leg o Topical Lidocaine 4% cream applied to wound bed prior to debridement (In Clinic Only). Primary Wound Dressing Wound #2 Left,Anterior Lower Leg o Silver Alginate Secondary  Dressing o ABD pad Dressing Change Frequency Wound #2 Left,Anterior Lower Leg o Change dressing every week Follow-up Appointments Wound #2 Left,Anterior Lower Leg o Return Appointment in 1 week. o Nurse Visit as needed Edema Control Wound #2 Left,Anterior Lower Leg o 3 Layer Compression System - Left Lower Extremity o Elevate legs to the level of the heart and pump ankles as often as possible Additional Orders / Instructions Wound #2 Left,Anterior Lower Leg o Increase protein intake. Electronic Signature(s) Signed: 11/22/2019 1:43:29 PM By: Lenda Kelp PA-C Signed: 11/22/2019 5:03:03 PM By: [name], [name], RN, [name], [name] RN, [name] Entered By: [name], [name], RN, [name], [name] on 11/22/2019 09:42:22 [name], [name] ([id number]) -------------------------------------------------------------------------------- Problem List Details Patient Name: Brunelle, Rhylen B. Date of Service: 11/22/2019 9:15 AM Medical Record Number: [id number] Patient Account Number: [id number] Date of Birth/Sex: Nov 07, 1940 (79 y.o. F) Treating RN: [name] Primary Care Provider: [name] Other Clinician: Referring Provider: Referral, Self Treating Provider/Extender: [name], Natahlia Hoggard Weeks in Treatment: 0 Active Problems ICD-10 Encounter Code Description Active Date MDM Diagnosis S81.802A Unspecified open wound, left lower leg, initial encounter 11/22/2019 No Yes L97.828 Non-pressure chronic ulcer of other part of left lower leg with other 11/22/2019 No Yes specified severity L03.116 Cellulitis of left lower limb 11/22/2019 No Yes I48.0 Paroxysmal atrial fibrillation 11/22/2019 No Yes I10 Essential (primary) hypertension 11/22/2019 No Yes I50.42 Chronic combined systolic (congestive) and diastolic  (congestive) heart 11/22/2019 No Yes failure Z79.01 Long term (current) use of anticoagulants 11/22/2019 No Yes Inactive Problems Resolved Problems Electronic Signature(s) Signed: 11/22/2019 9:31:52 AM By: Lenda Kelp PA-C Entered By: Lenda Kelp on 11/22/2019 09:31:52 [name], [name] ([id number]) -------------------------------------------------------------------------------- Progress Note Details Patient Name: [name], [name] Date of Service: 11/22/2019 9:15 AM Medical Record Number: [id number] Patient Account Number: [id number] Date of Birth/Sex: 1941/01/20 (79 y.o. F) Treating RN: [name] Primary Care Provider: [name] Other Clinician: Referring Provider: Referral, Self Treating Provider/Extender: [name], Claiborne Stroble Weeks in Treatment: 0 Subjective Chief Complaint Information obtained from Patient Left LE ulcer History of Present Illness (HPI) 06/07/2019 upon evaluation today patient presents today for initial evaluation in our clinic concerning issues she is having with her left lower extremity. She tells me that she hit her leg on a rocking chair when she was getting up and this was roughly at the beginning of December. Dr. Hyacinth Meeker who is her primary care provider has been helping with treating this area since that time. The patient states unfortunately this just is not getting any better. He is attempted salt water soaks as well as using antibiotic ointments topically unfortunately she has not noted much improvement. He wanted her to come to the wound center sooner but she declined although when things did not seem to be getting better she finally consented to this. Currently the patient does appear to have cellulitis of left lower extremity she also has atrial fibrillation noted for which she is on Eliquis this is probably part of the reason that she developed a large hematoma that she did. Subsequently she also has hypertension as well as congestive heart failure. The patient  does not appear to be febrile today and really is doing fairly well as far as that is concerned although the leg is warm to touch pretty much throughout. No fevers, chills, nausea, vomiting, or diarrhea. She has not been on any oral antibiotics. 06/28/2019 patient actually is seen today in follow-up for her lower extremity ulcer. She is status post having actually spent 9 days  in the hospital following when I sent her over for evaluation. They did have to keep her in the hospital and treat her with IV vancomycin secondary to the fact that she unfortunately had a bacteria present that obviously can be managed with oral medication with her penicillin allergy. Nonetheless her leg appears to be doing tremendously better today. There is no signs of active infection and overall very pleased with how things are progressing. I do believe that the wound is also measuring better along with looking much less irritated overall I think she is going to do quite well at this point. 07/05/2019 on evaluation today patient actually appears to be doing quite well with regard to her wound which is measuring smaller and still appears to be doing tremendously better compared to prior evaluation. I do believe the Melburn Popper has been of benefit for her. 07/12/19 patient's wound actually appears to be doing excellent at this time in regard to the left posterior lower extremity. She has been tolerating the dressing changes without complication. Fortunately there is much improvement noted here there is some slough noted she has been using Santyl I think that still appropriate. With that being said I am going to go ahead and perform sharp debridement today to clear away some of the necrotic material as well. 07/19/2019 upon evaluation today patient actually appears to be doing quite well in regard to her leg ulcer. She has been using Santyl and has been doing excellent with this up to this point. However I think based on what I am seeing  today we can likely proceed to doing something else, collagen, to help with getting this wound to heal more effectively and quickly. 07/26/2019 upon evaluation today patient appears to be doing excellent in regard to her lower extremity ulcer. She has been tolerating the dressing changes without complication. Fortunately the wound is continuing to measure smaller and does seem to be progressing quite nicely. I am very pleased with how things are going at this point. The patient likewise is extremely happy. 08/02/2019 upon evaluation today patient appears to be healing quite nicely and is making excellent progress at this point. Fortunately there is no evidence of active infection at this time which is good news. No fever chills noted 08/09/2019 on evaluation today patient's wound actually is continuing to show signs of excellent improvement. Is measuring much smaller this week even compared to last week and overall I am extremely pleased with the progress. Fortunately there is no evidence of active infection at this time. 08/23/2019 upon evaluation today patient appears to be doing well with regard to her original wound she is showing some signs of improvement here which is great news. Fortunately there is no signs of active infection at this time. No fevers, chills, nausea, vomiting, or diarrhea. 08/30/2019 upon evaluation today patient actually appears to be completely healed. She has little bit of dry skin at the location where the wound was but overall she has done extremely well. There is no signs of active infection at this time which is great news. Readmission: 11/22/2019 upon evaluation today patient appears to be doing somewhat poorly in regard to her leg ulcer. She tells me unfortunately that she fell out of bed about 1 month ago and subsequently has been monitoring this since she initially had a hematoma and it sounds like they were trying to get this to reabsorb Dr. Hyacinth Meeker that is her primary care  provider. With that being said her podiatrist apparently tried to  open this up according to what the patient tells me today and then subsequently gave her an antibiotic for her toe and said that would also help with the area on her leg. They were not able to get anything to come out as her podiatrist stated that this seemed to be too thick in order to drain. With that being said the patient was eventually referred back here to the wound center for further management. Patient History Information obtained from Patient. Allergies penicillin, amlodipine, Vicodin, Betadine, Sulfa (Sulfonamide Antibiotics) [name], [name] ([id number]) Social History Never smoker, Marital Status - Widowed, Alcohol Use - Rarely, Drug Use - No History, Caffeine Use - Moderate. Medical History Eyes Patient has history of Cataracts - removed Denies history of Glaucoma, Optic Neuritis Hematologic/Lymphatic Denies history of Anemia, Hemophilia, Human Immunodeficiency Virus, Lymphedema, Sickle Cell Disease Respiratory Denies history of Aspiration, Asthma, Chronic Obstructive Pulmonary Disease (COPD), Pneumothorax, Sleep Apnea, Tuberculosis Cardiovascular Patient has history of Arrhythmia - A Fib, Congestive Heart Failure, Hypertension Denies history of Coronary Artery Disease, Deep Vein Thrombosis, Hypotension, Myocardial Infarction, Peripheral Arterial Disease, Peripheral Venous Disease, Phlebitis, Vasculitis Gastrointestinal Denies history of Cirrhosis , Colitis, Crohn s, Hepatitis A, Hepatitis B, Hepatitis C Endocrine Denies history of Type I Diabetes, Type II Diabetes Genitourinary Denies history of End Stage Renal Disease Immunological Denies history of Lupus Erythematosus, Raynaud s, Scleroderma Integumentary (Skin) Denies history of History of Burn, History of pressure wounds Musculoskeletal Patient has history of Gout Denies history of Rheumatoid Arthritis, Osteoarthritis, Osteomyelitis Neurologic Denies  history of Dementia, Neuropathy, Quadriplegia, Paraplegia, Seizure Disorder Oncologic Denies history of Received Chemotherapy, Received Radiation Psychiatric Denies history of Anorexia/bulimia, Confinement Anxiety Hospitalization/Surgery History - [facility] sepsis. Review of Systems (ROS) Constitutional Symptoms (General Health) Denies complaints or symptoms of Fatigue, Fever, Chills, Marked Weight Change. Eyes Denies complaints or symptoms of Dry Eyes, Vision Changes, Glasses / Contacts. Ear/Nose/Mouth/Throat Denies complaints or symptoms of Difficult clearing ears, Sinusitis. Hematologic/Lymphatic Denies complaints or symptoms of Bleeding / Clotting Disorders, Human Immunodeficiency Virus. Respiratory Denies complaints or symptoms of Chronic or frequent coughs, Shortness of Breath. Cardiovascular Denies complaints or symptoms of Chest pain, LE edema. Gastrointestinal Denies complaints or symptoms of Frequent diarrhea, Nausea, Vomiting. Endocrine Denies complaints or symptoms of Hepatitis, Thyroid disease, Polydypsia (Excessive Thirst). Genitourinary Denies complaints or symptoms of Kidney failure/ Dialysis, Incontinence/dribbling. Immunological Denies complaints or symptoms of Hives, Itching. Integumentary (Skin) Denies complaints or symptoms of Wounds, Bleeding or bruising tendency, Breakdown, Swelling. Musculoskeletal Denies complaints or symptoms of Muscle Pain, Muscle Weakness. Neurologic Denies complaints or symptoms of Numbness/parasthesias, Focal/Weakness. Psychiatric Denies complaints or symptoms of Anxiety, Claustrophobia. Objective [name], [name] ([id number]) Constitutional sitting or standing blood pressure is within target range for patient.. pulse regular and within target range for patient.Marland Kitchen respirations regular, non- labored and within target range for patient.Marland Kitchen temperature within target range for patient.. Well-nourished and well-hydrated in no acute  distress. Vitals Time Taken: 9:17 AM, Height: 66 in, Source: Stated, Weight: 192 lbs, Source: Stated, BMI: 31, Temperature: 97.8 F, Pulse: 86 bpm, Respiratory Rate: 16 breaths/min, Blood Pressure: 127/53 mmHg. Eyes conjunctiva clear no eyelid edema noted. pupils equal round and reactive to light and accommodation. Ears, Nose, Mouth, and Throat no gross abnormality of ear auricles or external auditory canals. normal hearing noted during conversation. mucus membranes moist. Respiratory normal breathing without difficulty. Cardiovascular 2+ dorsalis pedis/posterior tibialis pulses. 1+ pitting edema of the bilateral lower extremities. Musculoskeletal normal gait and posture. no significant deformity or arthritic changes, no loss or  range of motion, no clubbing. Psychiatric this patient is able to make decisions and demonstrates good insight into disease process. Alert and Oriented x 3. pleasant and cooperative. General Notes: Upon inspection patient's wound bed actually showed signs of good granulation at this time. There does not appear to be any evidence of active infection which is good news. There is slough noted on the surface of the wound and this is going require sharp debridement today to clear away some of the necrotic debris at this point. I discussed this with the patient and she does want me to proceed with debridement today. Therefore I did actually perform sharp debridement to clear away some of the necrotic debris from the surface of the wound she tolerated this well today without complication post debridement the wound bed appears to be doing much better. Integumentary (Hair, Skin) Wound #2 status is Open. Original cause of wound was Trauma. The wound is located on the Left,Anterior Lower Leg. The wound measures 3.6cm length x 2.5cm width x 0.1cm depth; 7.069cm^2 area and 0.707cm^3 volume. There is Fat Layer (Subcutaneous Tissue) Exposed exposed. There is no tunneling noted.  There is a medium amount of serosanguineous drainage noted. The wound margin is flat and intact. There is small (1-33%) red granulation within the wound bed. There is a large (67-100%) amount of necrotic tissue within the wound bed including Eschar and Adherent Slough. Assessment Active Problems ICD-10 Unspecified open wound, left lower leg, initial encounter Non-pressure chronic ulcer of other part of left lower leg with other specified severity Cellulitis of left lower limb Paroxysmal atrial fibrillation Essential (primary) hypertension Chronic combined systolic (congestive) and diastolic (congestive) heart failure Long term (current) use of anticoagulants Procedures Wound #2 Pre-procedure diagnosis of Wound #2 is a Trauma, Other located on the Left,Anterior Lower Leg . There was a Excisional Skin/Subcutaneous Tissue Debridement with a total area of 9 sq cm performed by STONE III, Carlis Blanchard E., PA-C. With the following instrument(s): Curette to remove Viable and Non-Viable tissue/material. Material removed includes Subcutaneous Tissue and Slough and. No specimens were taken. A time out was conducted at 09:35, prior to the start of the procedure. A Moderate amount of bleeding was controlled with Silver Nitrate. The procedure was tolerated well. Post Debridement Measurements: 3.6cm length x 2.5cm width x 0.2cm depth; 1.414cm^3 volume. Character of Wound/Ulcer Post Debridement is stable. Post procedure Diagnosis Wound #2: Same as Pre-Procedure [name], [name] ([id number]) Plan Anesthetic (add to Medication List): Wound #2 Left,Anterior Lower Leg: Topical Lidocaine 4% cream applied to wound bed prior to debridement (In Clinic Only). Primary Wound Dressing: Wound #2 Left,Anterior Lower Leg: Silver Alginate Secondary Dressing: ABD pad Dressing Change Frequency: Wound #2 Left,Anterior Lower Leg: Change dressing every week Follow-up Appointments: Wound #2 Left,Anterior Lower Leg: Return  Appointment in 1 week. Nurse Visit as needed Edema Control: Wound #2 Left,Anterior Lower Leg: 3 Layer Compression System - Left Lower Extremity Elevate legs to the level of the heart and pump ankles as often as possible Additional Orders / Instructions: Wound #2 Left,Anterior Lower Leg: Increase protein intake. 1. I am going to suggest that we go ahead and initiate treatment with wound care measures including a silver alginate dressing followed by an ABD pad and then subsequently a 3 layer compression wrap which she tolerated well previously. 2. I am also going to suggest that the patient needs to elevate her legs much as possible try to keep edema under good control. 3. I would also recommend that we will  continue to monitor for any signs of infection I do not believe she needs any oral antibiotics at this point although if anything changes in that regard we will address that as necessary. We will see patient back for reevaluation in 1 week here in the clinic. If anything worsens or changes patient will contact our office for additional recommendations. Electronic Signature(s) Signed: 11/22/2019 3:30:53 PM By: Lenda Kelp PA-C Entered By: Lenda Kelp on 11/22/2019 15:30:53 [name], [name] ([id number]) -------------------------------------------------------------------------------- ROS/PFSH Details Patient Name: Kleven, Katherleen B. Date of Service: 11/22/2019 9:15 AM Medical Record Number: [id number] Patient Account Number: [id number] Date of Birth/Sex: 18-Oct-1940 (79 y.o. F) Treating RN: Rodell Perna Primary Care Provider: [name] Other Clinician: Referring Provider: Referral, Self Treating Provider/Extender: [name], Treston Coker Weeks in Treatment: 0 Information Obtained From Patient Constitutional Symptoms (General Health) Complaints and Symptoms: Negative for: Fatigue; Fever; Chills; Marked Weight Change Eyes Complaints and Symptoms: Negative for: Dry Eyes; Vision Changes; Glasses  / Contacts Medical History: Positive for: Cataracts - removed Negative for: Glaucoma; Optic Neuritis Ear/Nose/Mouth/Throat Complaints and Symptoms: Negative for: Difficult clearing ears; Sinusitis Hematologic/Lymphatic Complaints and Symptoms: Negative for: Bleeding / Clotting Disorders; Human Immunodeficiency Virus Medical History: Negative for: Anemia; Hemophilia; Human Immunodeficiency Virus; Lymphedema; Sickle Cell Disease Respiratory Complaints and Symptoms: Negative for: Chronic or frequent coughs; Shortness of Breath Medical History: Negative for: Aspiration; Asthma; Chronic Obstructive Pulmonary Disease (COPD); Pneumothorax; Sleep Apnea; Tuberculosis Cardiovascular Complaints and Symptoms: Negative for: Chest pain; LE edema Medical History: Positive for: Arrhythmia - A Fib; Congestive Heart Failure; Hypertension Negative for: Coronary Artery Disease; Deep Vein Thrombosis; Hypotension; Myocardial Infarction; Peripheral Arterial Disease; Peripheral Venous Disease; Phlebitis; Vasculitis Gastrointestinal Complaints and Symptoms: Negative for: Frequent diarrhea; Nausea; Vomiting Medical History: Negative for: Cirrhosis ; Colitis; Crohnos; Hepatitis A; Hepatitis B; Hepatitis C Endocrine Complaints and Symptoms: Negative for: Hepatitis; Thyroid disease; Polydypsia (Excessive Thirst) Trautmann, Gussie B. ([id number]) Medical History: Negative for: Type I Diabetes; Type II Diabetes Genitourinary Complaints and Symptoms: Negative for: Kidney failure/ Dialysis; Incontinence/dribbling Medical History: Negative for: End Stage Renal Disease Immunological Complaints and Symptoms: Negative for: Hives; Itching Medical History: Negative for: Lupus Erythematosus; Raynaudos; Scleroderma Integumentary (Skin) Complaints and Symptoms: Negative for: Wounds; Bleeding or bruising tendency; Breakdown; Swelling Medical History: Negative for: History of Burn; History of pressure  wounds Musculoskeletal Complaints and Symptoms: Negative for: Muscle Pain; Muscle Weakness Medical History: Positive for: Gout Negative for: Rheumatoid Arthritis; Osteoarthritis; Osteomyelitis Neurologic Complaints and Symptoms: Negative for: Numbness/parasthesias; Focal/Weakness Medical History: Negative for: Dementia; Neuropathy; Quadriplegia; Paraplegia; Seizure Disorder Psychiatric Complaints and Symptoms: Negative for: Anxiety; Claustrophobia Medical History: Negative for: Anorexia/bulimia; Confinement Anxiety Oncologic Medical History: Negative for: Received Chemotherapy; Received Radiation HBO Extended History Items Eyes: Cataracts Immunizations Pneumococcal Vaccine: Received Pneumococcal Vaccination: Yes Implantable Devices None Wojnar, Nadeen B. ([id number]) Hospitalization / Surgery History Type of Hospitalization/Surgery [facility] sepsis Family and Social History Never smoker; Marital Status - Widowed; Alcohol Use: Rarely; Drug Use: No History; Caffeine Use: Moderate; Financial Concerns: No; Food, Clothing or Shelter Needs: No; Support System Lacking: No; Transportation Concerns: No Electronic Signature(s) Signed: 11/22/2019 10:21:07 AM By: Rodell Perna Signed: 11/22/2019 1:43:29 PM By: Lenda Kelp PA-C Entered By: Rodell Perna on 11/22/2019 09:20:26 Limon, Alonnah B. ([id number]) -------------------------------------------------------------------------------- SuperBill Details Patient Name: Hipp, Glenyce B. Date of Service: 11/22/2019 Medical Record Number: [id number] Patient Account Number: [id number] Date of Birth/Sex: 01-Mar-1941 (79 y.o. F) Treating RN: [name] Primary Care Provider: [name] Other Clinician: Referring Provider: Referral, Self Treating Provider/Extender: [name], Lindel Marcell  Weeks in Treatment: 0 Diagnosis Coding ICD-10 Codes Code Description S81.802A Unspecified open wound, left lower leg, initial encounter L97.828 Non-pressure chronic ulcer of  other part of left lower leg with other specified severity L03.116 Cellulitis of left lower limb I48.0 Paroxysmal atrial fibrillation I10 Essential (primary) hypertension I50.42 Chronic combined systolic (congestive) and diastolic (congestive) heart failure Z79.01 Long term (current) use of anticoagulants Facility Procedures CPT4 Code: 16109604 Description: 99213 - WOUND CARE VISIT-LEV 3 EST PT Modifier: Quantity: 1 CPT4 Code: 54098119 Description: 11042 - DEB SUBQ TISSUE 20 SQ CM/< Modifier: Quantity: 1 CPT4 Code: Description: ICD-10 Diagnosis Description L97.828 Non-pressure chronic ulcer of other part of left lower leg with other speci Modifier: fied severity Quantity: Physician Procedures CPT4 Code: 1478295 Description: 99214 - WC PHYS LEVEL 4 - EST PT Modifier: 25 Quantity: 1 CPT4 Code: Description: ICD-10 Diagnosis Description S81.802A Unspecified open wound, left lower leg, initial encounter L97.828 Non-pressure chronic ulcer of other part of left lower leg with other spec L03.116 Cellulitis of left lower limb I48.0 Paroxysmal atrial  fibrillation Modifier: ified severity Quantity: CPT4 Code: 6213086 Description: 11042 - WC PHYS SUBQ TISS 20 SQ CM Modifier: Quantity: 1 CPT4 Code: Description: ICD-10 Diagnosis Description L97.828 Non-pressure chronic ulcer of other part of left lower leg with other spec Modifier: ified severity Quantity: Electronic Signature(s) Signed: 11/22/2019 3:31:12 PM By: Lenda Kelp PA-C Entered By: Lenda Kelp on 11/22/2019 15:31:12

## 2019-11-29 ENCOUNTER — Other Ambulatory Visit: Payer: Self-pay

## 2019-11-29 ENCOUNTER — Encounter: Payer: Medicare Other | Admitting: Physician Assistant

## 2019-11-29 DIAGNOSIS — S81802A Unspecified open wound, left lower leg, initial encounter: Secondary | ICD-10-CM | POA: Diagnosis not present

## 2019-12-04 NOTE — Progress Notes (Addendum)
Prokop, Reathel B. ([id number]) Visit Report for 11/29/2019 Arrival Information Details Patient Name: [name], [name] Date of Service: 11/29/2019 1:00 PM Medical Record Number: [id number] Patient Account Number: [id number] Date of Birth/Sex: 1941-03-12 (79 y.o. F) Treating RN: [name] Primary Care Di Jasmer: [name] Other Clinician: Referring Romell Cavanah: [name] Treating Alexa Golebiewski/Extender: [name], [name] Weeks in Treatment: 1 Visit Information History Since Last Visit Added or deleted any medications: Yes Patient Arrived: Ambulatory Any new allergies or adverse reactions: No Arrival Time: 13:11 Had a fall or experienced change in No Accompanied By: friend activities of daily living that may affect Transfer Assistance: None risk of falls: Patient Identification Verified: Yes Signs or symptoms of abuse/neglect since last visito No Secondary Verification Process Completed: Yes Hospitalized since last visit: No Implantable device outside of the clinic excluding No cellular tissue based products placed in the center since last visit: Has Dressing in Place as Prescribed: Yes Has Compression in Place as Prescribed: Yes Pain Present Now: No Electronic Signature(s) Signed: 11/29/2019 4:12:51 PM By: Dayton Martes RCP, RRT, CHT Entered By: Dayton Martes on 11/29/2019 13:13:11 Mulvaney, Lennyn B. ([id number]) -------------------------------------------------------------------------------- Encounter Discharge Information Details Patient Name: [name], [name] Date of Service: 11/29/2019 1:00 PM Medical Record Number: [id number] Patient Account Number: [id number] Date of Birth/Sex: [birth year] (79 y.o. F) Treating RN: [name] Primary Care Kiasia Chou: [name] Other Clinician: Referring Wong Steadham: [name] Treating Theoden Mauch/Extender: [name], [name] Weeks in Treatment: 1 Encounter Discharge Information Items Post Procedure Vitals Discharge Condition:  Stable Temperature (F): 97.6 Ambulatory Status: Ambulatory Pulse (bpm): 75 Discharge Destination: Home Respiratory Rate (breaths/min): 18 Transportation: Private Auto Blood Pressure (mmHg): 135/53 Accompanied By: friend Schedule Follow-up Appointment: Yes Clinical Summary of Care: Electronic Signature(s) Signed: 12/04/2019 6:25:04 PM By: [name], [name], RN, [name], [name] RN, [name] Entered By: [name], [name], RN, [name], [name] on 11/29/2019 13:43:01 Dirks, Abie B. ([id number]) -------------------------------------------------------------------------------- Lower Extremity Assessment Details Patient Name: [name], [name] Date of Service: 11/29/2019 1:00 PM Medical Record Number: [id number] Patient Account Number: [id number] Date of Birth/Sex: 14-Jan-1941 (79 y.o. F) Treating RN: Rodell Perna Primary Care Navneet Schmuck: [name] Other Clinician: Referring Jamicheal Heard: [name] Treating Tierany Appleby/Extender: [name], [name] Weeks in Treatment: 1 Edema Assessment Assessed: [Left: No] [Right: No] Edema: [Left: N] [Right: o] Calf Left: Right: Point of Measurement: 30 cm From Medial Instep 36 cm cm Ankle Left: Right: Point of Measurement: 10 cm From Medial Instep 23 cm cm Vascular Assessment Pulses: Dorsalis Pedis Palpable: [Left:Yes] Electronic Signature(s) Signed: 12/04/2019 4:08:07 PM By: Rodell Perna Entered By: Rodell Perna on 11/29/2019 13:17:25 Houlton, Naveah B. ([id number]) -------------------------------------------------------------------------------- Multi Wound Chart Details Patient Name: [name], [name] Date of Service: 11/29/2019 1:00 PM Medical Record Number: [id number] Patient Account Number: [id number] Date of Birth/Sex: Nov 06, 1940 (79 y.o. F) Treating RN: [name] Primary Care Amana Bouska: [name] Other Clinician: Referring Mckinze Poirier: [name] Treating Jayelle Page/Extender: [name], [name] Weeks in Treatment: 1 Vital Signs Height(in): 66 Pulse(bpm): 75 Weight(lbs): 192 Blood  Pressure(mmHg): 135/53 Body Mass Index(BMI): 31 Temperature(F): 97.6 Respiratory Rate(breaths/min): 18 Photos: [N/A:N/A] Wound Location: Left, Anterior Lower Leg N/A N/A Wounding Event: Trauma N/A N/A Primary Etiology: Trauma, Other N/A N/A Comorbid History: Cataracts, Arrhythmia, Congestive N/A N/A Heart Failure, Hypertension, Gout Date Acquired: 10/22/2019 N/A N/A Weeks of Treatment: 1 N/A N/A Wound Status: Open N/A N/A Measurements L x W x D (cm) 5.5x4x0.1 N/A N/A Area (cm) : 17.279 N/A N/A Volume (cm) : 1.728 N/A N/A % Reduction in Area: -144.40% N/A N/A % Reduction  in Volume: -144.40% N/A N/A Classification: Full Thickness Without Exposed N/A N/A Support Structures Exudate Amount: Medium N/A N/A Exudate Type: Serosanguineous N/A N/A Exudate Color: red, brown N/A N/A Wound Margin: Flat and Intact N/A N/A Granulation Amount: Small (1-33%) N/A N/A Granulation Quality: Red N/A N/A Necrotic Amount: Large (67-100%) N/A N/A Necrotic Tissue: Eschar, Adherent Slough N/A N/A Exposed Structures: Fat Layer (Subcutaneous Tissue) N/A N/A Exposed: Yes Fascia: No Tendon: No Muscle: No Joint: No Bone: No Epithelialization: None N/A N/A Treatment Notes Electronic Signature(s) Signed: 12/04/2019 6:25:04 PM By: [name], [name], RN, [name], [name] RN, [name] Entered By: [name], [name], RN, [name], [name] on 11/29/2019 13:31:24 Stalvey, Melanie B. ([id number]) -------------------------------------------------------------------------------- Multi-Disciplinary Care Plan Details Patient Name: [name], [name] Date of Service: 11/29/2019 1:00 PM Medical Record Number: [id number] Patient Account Number: [id number] Date of Birth/Sex: 06-Jun-1940 (79 y.o. F) Treating RN: [name] Primary Care Alvis Pulcini: [name] Other Clinician: Referring Willian Donson: [name] Treating Jaylena Holloway/Extender: [name], [name] Weeks in Treatment: 1 Active Inactive Abuse / Safety / Falls / Self Care Management Nursing Diagnoses: History of  Falls Goals: Patient will remain injury free related to falls Date Initiated: 11/22/2019 Target Resolution Date: 11/29/2019 Goal Status: Active Interventions: Assess fall risk on admission and as needed Notes: Necrotic Tissue Nursing Diagnoses: Impaired tissue integrity related to necrotic/devitalized tissue Goals: Necrotic/devitalized tissue will be minimized in the wound bed Date Initiated: 11/22/2019 Target Resolution Date: 12/06/2019 Goal Status: Active Interventions: Provide education on necrotic tissue and debridement process Treatment Activities: Excisional debridement : 11/22/2019 Notes: Orientation to the Wound Care Program Nursing Diagnoses: Knowledge deficit related to the wound healing center program Goals: Patient/caregiver will verbalize understanding of the Wound Healing Center Program Date Initiated: 11/22/2019 Target Resolution Date: 11/22/2019 Goal Status: Active Interventions: Provide education on orientation to the wound center Notes: Pain, Acute or Chronic Nursing Diagnoses: Pain Management - Non-cyclic Acute (Procedural) Goals: Patient will verbalize adequate pain control and receive pain control interventions during procedures as needed Latino, Jennika B. ([id number]) Date Initiated: 11/22/2019 Target Resolution Date: 11/29/2019 Goal Status: Active Interventions: Complete pain assessment as per visit requirements Treatment Activities: Administer pain control measures as ordered : 11/22/2019 Notes: Wound/Skin Impairment Nursing Diagnoses: Impaired tissue integrity Goals: Ulcer/skin breakdown will have a volume reduction of 30% by week 4 Date Initiated: 11/22/2019 Target Resolution Date: 12/28/2019 Goal Status: Active Interventions: Assess ulceration(s) every visit Treatment Activities: Topical wound management initiated : 11/22/2019 Notes: Electronic Signature(s) Signed: 12/04/2019 6:25:04 PM By: [name], [name], RN, [name], [name] RN, [name] Entered By: [name], [name], RN, [name], [name]  on 11/29/2019 13:31:13 Hochmuth, Emaya B. ([id number]) -------------------------------------------------------------------------------- Pain Assessment Details Patient Name: Kicklighter, Kimberlea B. Date of Service: 11/29/2019 1:00 PM Medical Record Number: [id number] Patient Account Number: [id number] Date of Birth/Sex: [birth year] (79 y.o. F) Treating RN: Rodell Perna Primary Care Clarivel Callaway: [name] Other Clinician: Referring Morgaine Kimball: [name] Treating Lorrene Graef/Extender: [name], [name] Weeks in Treatment: 1 Active Problems Location of Pain Severity and Description of Pain Patient Has Paino No Site Locations Pain Management and Medication Current Pain Management: Electronic Signature(s) Signed: 12/04/2019 4:08:07 PM By: Rodell Perna Entered By: Rodell Perna on 11/29/2019 13:14:18 Shinn, Alexus B. ([id number]) -------------------------------------------------------------------------------- Patient/Caregiver Education Details Patient Name: Wicke, Jody B. Date of Service: 11/29/2019 1:00 PM Medical Record Number: [id number] Patient Account Number: [id number] Date of Birth/Gender: June 20, 1940 (79 y.o. F) Treating RN: [name] Primary Care Physician: [name] Other Clinician: Referring Physician: [name] Treating Physician/Extender: [name], [name] Weeks in Treatment: 1 Education Assessment Education Provided  To: Patient Education Topics Provided Wound Debridement: Handouts: Wound Debridement Methods: Demonstration Wound/Skin Impairment: Handouts: Caring for Your Ulcer Methods: Demonstration, Explain/Verbal Responses: State content correctly Electronic Signature(s) Signed: 12/04/2019 6:25:04 PM By: [name], [name], RN, [name], [name] RN, [name] Entered By: [name], [name], RN, [name], [name] on 11/29/2019 13:42:13 Rane, Arrietty B. ([id number]) -------------------------------------------------------------------------------- Wound Assessment Details Patient Name: Davitt, Aranza B. Date of Service: 11/29/2019 1:00  PM Medical Record Number: [id number] Patient Account Number: [id number] Date of Birth/Sex: April 05, 1941 (79 y.o. F) Treating RN: Rodell Perna Primary Care Emmagene Ortner: [name] Other Clinician: Referring Nathanael Krist: [name] Treating Jacquelina Hewins/Extender: [name], [name] Weeks in Treatment: 1 Wound Status Wound Number: 2 Primary Trauma, Other Etiology: Wound Location: Left, Anterior Lower Leg Wound Status: Open Wounding Event: Trauma Comorbid Cataracts, Arrhythmia, Congestive Heart Failure, Date Acquired: 10/22/2019 History: Hypertension, Gout Weeks Of Treatment: 1 Clustered Wound: No Photos Wound Measurements Length: (cm) 5.5 Width: (cm) 4 Depth: (cm) 0.1 Area: (cm) 17.279 Volume: (cm) 1.728 % Reduction in Area: -144.4% % Reduction in Volume: -144.4% Epithelialization: None Wound Description Classification: Full Thickness Without Exposed Support Structu Wound Margin: Flat and Intact Exudate Amount: Medium Exudate Type: Serosanguineous Exudate Color: red, brown res Foul Odor After Cleansing: No Slough/Fibrino Yes Wound Bed Granulation Amount: Small (1-33%) Exposed Structure Granulation Quality: Red Fascia Exposed: No Necrotic Amount: Large (67-100%) Fat Layer (Subcutaneous Tissue) Exposed: Yes Necrotic Quality: Eschar, Adherent Slough Tendon Exposed: No Muscle Exposed: No Joint Exposed: No Bone Exposed: No Treatment Notes Wound #2 (Left, Anterior Lower Leg) Notes Silver cell Electronic Signature(s) Signed: 12/04/2019 4:08:07 PM By: Melchor Amour, Abbagale B. ([id number]) Entered By: Rodell Perna on 11/29/2019 13:15:07 Norsworthy, Leigh B. ([id number]) -------------------------------------------------------------------------------- Vitals Details Patient Name: Derenzo, Amarrah B. Date of Service: 11/29/2019 1:00 PM Medical Record Number: [id number] Patient Account Number: [id number] Date of Birth/Sex: [birth year] (79 y.o. F) Treating RN: [name] Primary Care Talonda Artist: [name] Other Clinician: Referring Coy Vandoren: [name] Treating Dimitra Woodstock/Extender: [name], [name] Weeks in Treatment: 1 Vital Signs Time Taken: 13:10 Temperature (F): 97.6 Height (in): 66 Pulse (bpm): 75 Weight (lbs): 192 Respiratory Rate (breaths/min): 18 Body Mass Index (BMI): 31 Blood Pressure (mmHg): 135/53 Reference Range: 80 - 120 mg / dl Electronic Signature(s) Signed: 11/29/2019 4:12:51 PM By: Dayton Martes RCP, RRT, CHT Entered By: Dayton Martes on 11/29/2019 13:13:32

## 2019-12-04 NOTE — Progress Notes (Addendum)
Sambrano, Rashida B. ([id number]) Visit Report for 11/29/2019 Chief Complaint Document Details Patient Name: [name], [name] Date of Service: 11/29/2019 1:00 PM Medical Record Number: [id number] Patient Account Number: [id number] Date of Birth/Sex: 1940/10/22 (79 y.o. F) Treating RN: [name] Primary Care Provider: [name] Other Clinician: Referring Provider: [name] Treating Provider/Extender: [name], Brittnye Josephs Weeks in Treatment: 1 Information Obtained from: Patient Chief Complaint Left LE ulcer Electronic Signature(s) Signed: 11/29/2019 1:08:51 PM By: Lenda Kelp PA-C Entered By: Lenda Kelp on 11/29/2019 13:08:51 Mccanless, Mehgan B. ([id number]) -------------------------------------------------------------------------------- Debridement Details Patient Name: [name], [name] Date of Service: 11/29/2019 1:00 PM Medical Record Number: [id number] Patient Account Number: [id number] Date of Birth/Sex: 1940-10-03 (79 y.o. F) Treating RN: [name] Primary Care Provider: [name] Other Clinician: Referring Provider: [name] Treating Provider/Extender: [name], Elizebeth Kluesner Weeks in Treatment: 1 Debridement Performed for Wound #2 Left,Anterior Lower Leg Assessment: Performed By: Physician STONE III, Shermika Balthaser E., PA-C Debridement Type: Debridement Level of Consciousness (Pre- Awake and Alert procedure): Pre-procedure Verification/Time Out Yes - 13:33 Taken: Start Time: 13:33 Pain Control: Lidocaine Total Area Debrided (L x W): 3 (cm) x 4 (cm) = 12 (cm) Tissue and other material Viable, Non-Viable, Slough, Subcutaneous, Biofilm, Slough debrided: Level: Skin/Subcutaneous Tissue Debridement Description: Excisional Instrument: Curette Bleeding: Moderate Hemostasis Achieved: Pressure Response to Treatment: Procedure was tolerated well Level of Consciousness (Post- Awake and Alert procedure): Post Debridement Measurements of Total Wound Length: (cm) 5.5 Width: (cm) 4 Depth: (cm)  0.2 Volume: (cm) 3.456 Character of Wound/Ulcer Post Debridement: Stable Post Procedure Diagnosis Same as Pre-procedure Electronic Signature(s) Signed: 11/29/2019 3:59:28 PM By: Lenda Kelp PA-C Signed: 12/04/2019 6:25:04 PM By: [name], [name], RN, [name], [name] RN, [name] Entered By: [name], [name], RN, [name], [name] on 11/29/2019 13:34:26 Berrian, Brittie B. ([id number]) -------------------------------------------------------------------------------- HPI Details Patient Name: [name], [name] Date of Service: 11/29/2019 1:00 PM Medical Record Number: [id number] Patient Account Number: [id number] Date of Birth/Sex: Jan 16, 1941 (79 y.o. F) Treating RN: [name] Primary Care Provider: [name] Other Clinician: Referring Provider: [name] Treating Provider/Extender: [name], Darby Fleeman Weeks in Treatment: 1 History of Present Illness HPI Description: 06/07/2019 upon evaluation today patient presents today for initial evaluation in our clinic concerning issues she is having with her left lower extremity. She tells me that she hit her leg on a rocking chair when she was getting up and this was roughly at the beginning of December. Dr. Hyacinth Meeker who is her primary care provider has been helping with treating this area since that time. The patient states unfortunately this just is not getting any better. He is attempted salt water soaks as well as using antibiotic ointments topically unfortunately she has not noted much improvement. He wanted her to come to the wound center sooner but she declined although when things did not seem to be getting better she finally consented to this. Currently the patient does appear to have cellulitis of left lower extremity she also has atrial fibrillation noted for which she is on Eliquis this is probably part of the reason that she developed a large hematoma that she did. Subsequently she also has hypertension as well as congestive heart failure. The patient does not appear to be febrile  today and really is doing fairly well as far as that is concerned although the leg is warm to touch pretty much throughout. No fevers, chills, nausea, vomiting, or diarrhea. She has not been on any oral antibiotics. 06/28/2019 patient actually is seen today in follow-up  for her lower extremity ulcer. She is status post having actually spent 9 days in the hospital following when I sent her over for evaluation. They did have to keep her in the hospital and treat her with IV vancomycin secondary to the fact that she unfortunately had a bacteria present that obviously can be managed with oral medication with her penicillin allergy. Nonetheless her leg appears to be doing tremendously better today. There is no signs of active infection and overall very pleased with how things are progressing. I do believe that the wound is also measuring better along with looking much less irritated overall I think she is going to do quite well at this point. 07/05/2019 on evaluation today patient actually appears to be doing quite well with regard to her wound which is measuring smaller and still appears to be doing tremendously better compared to prior evaluation. I do believe the Melburn Popper has been of benefit for her. 07/12/19 patient's wound actually appears to be doing excellent at this time in regard to the left posterior lower extremity. She has been tolerating the dressing changes without complication. Fortunately there is much improvement noted here there is some slough noted she has been using Santyl I think that still appropriate. With that being said I am going to go ahead and perform sharp debridement today to clear away some of the necrotic material as well. 07/19/2019 upon evaluation today patient actually appears to be doing quite well in regard to her leg ulcer. She has been using Santyl and has been doing excellent with this up to this point. However I think based on what I am seeing today we can likely proceed  to doing something else, collagen, to help with getting this wound to heal more effectively and quickly. 07/26/2019 upon evaluation today patient appears to be doing excellent in regard to her lower extremity ulcer. She has been tolerating the dressing changes without complication. Fortunately the wound is continuing to measure smaller and does seem to be progressing quite nicely. I am very pleased with how things are going at this point. The patient likewise is extremely happy. 08/02/2019 upon evaluation today patient appears to be healing quite nicely and is making excellent progress at this point. Fortunately there is no evidence of active infection at this time which is good news. No fever chills noted 08/09/2019 on evaluation today patient's wound actually is continuing to show signs of excellent improvement. Is measuring much smaller this week even compared to last week and overall I am extremely pleased with the progress. Fortunately there is no evidence of active infection at this time. 08/23/2019 upon evaluation today patient appears to be doing well with regard to her original wound she is showing some signs of improvement here which is great news. Fortunately there is no signs of active infection at this time. No fevers, chills, nausea, vomiting, or diarrhea. 08/30/2019 upon evaluation today patient actually appears to be completely healed. She has little bit of dry skin at the location where the wound was but overall she has done extremely well. There is no signs of active infection at this time which is great news. Readmission: 11/22/2019 upon evaluation today patient appears to be doing somewhat poorly in regard to her leg ulcer. She tells me unfortunately that she fell out of bed about 1 month ago and subsequently has been monitoring this since she initially had a hematoma and it sounds like they were trying to get this to reabsorb Dr. Hyacinth Meeker that is  her primary care provider. With that being  said her podiatrist apparently tried to open this up according to what the patient tells me today and then subsequently gave her an antibiotic for her toe and said that would also help with the area on her leg. They were not able to get anything to come out as her podiatrist stated that this seemed to be too thick in order to drain. With that being said the patient was eventually referred back here to the wound center for further management. 11/29/2019 on evaluation today patient appears to be doing a little bit more poorly today regarding her wound as compared to her last visit here. Again this is due to blistering on the edges of the wound which is very superficial but nonetheless new since last time. This is likely due to the fact that she did have to take off her compression wrap as she was having significant discomfort due to gout that flared up in her foot. Nonetheless she was seen by her primary care provider where she was given a shot of steroids and has new medication for gout that she is now taking. She still having discomfort however she states she does not believe she is good to be able to wear the wrap. Electronic Signature(s) Signed: 11/29/2019 1:51:20 PM By: Harlow Mares, Arynn B. ([id number]) Entered By: Lenda Kelp on 11/29/2019 13:51:19 Shindler, Demoni B. ([id number]) -------------------------------------------------------------------------------- Physical Exam Details Patient Name: Viereck, Khrystal B. Date of Service: 11/29/2019 1:00 PM Medical Record Number: [id number] Patient Account Number: [id number] Date of Birth/Sex: [birth year] (79 y.o. F) Treating RN: [name] Primary Care Provider: [name] Other Clinician: Referring Provider: [name] Treating Provider/Extender: [name], Cayetano Mikita Weeks in Treatment: 1 Constitutional Well-nourished and well-hydrated in no acute distress. Respiratory normal breathing without difficulty. Psychiatric this patient is able to  make decisions and demonstrates good insight into disease process. Alert and Oriented x 3. pleasant and cooperative. Notes On inspection patient's wound bed did have some slough noted I did have to perform sharp debridement clear away some of the necrotic debris and she tolerated that today without complication post debridement wound bed appears to be doing much better which is great news. With that being said we still need to control her edema and again I think that potentially Velcro compression wrap could be beneficial for her nonetheless I think she is can have to utilize something. Also do believe she should be using her lymphedema pumps I had actually forgotten that she had these that something that she is going to try to initiate again as well. Electronic Signature(s) Signed: 11/29/2019 1:51:44 PM By: Lenda Kelp PA-C Entered By: Lenda Kelp on 11/29/2019 13:51:43 Millikin, Nury B. ([id number]) -------------------------------------------------------------------------------- Physician Orders Details Patient Name: Hovland, Lamiyah B. Date of Service: 11/29/2019 1:00 PM Medical Record Number: [id number] Patient Account Number: [id number] Date of Birth/Sex: [birth year] (79 y.o. F) Treating RN: [name] Primary Care Provider: [name] Other Clinician: Referring Provider: [name] Treating Provider/Extender: [name], Jonnie Truxillo Weeks in Treatment: 1 Verbal / Phone Orders: No Diagnosis Coding ICD-10 Coding Code Description [phone] Unspecified open wound, left lower leg, initial encounter L97.828 Non-pressure chronic ulcer of other part of left lower leg with other specified severity L03.116 Cellulitis of left lower limb I48.0 Paroxysmal atrial fibrillation I10 Essential (primary) hypertension I50.42 Chronic combined systolic (congestive) and diastolic (congestive) heart failure Z79.01 Long term (current) use of anticoagulants Anesthetic (add to Medication List) Wound #2 Left,Anterior  Lower Leg o Topical Lidocaine 4% cream applied to wound bed prior to debridement (In Clinic Only). Primary Wound Dressing Wound #2 Left,Anterior Lower Leg o Silver Alginate Secondary Dressing o ABD and Kerlix/Conform Dressing Change Frequency Wound #2 Left,Anterior Lower Leg o Change Dressing Monday, Wednesday, Friday Follow-up Appointments Wound #2 Left,Anterior Lower Leg o Return Appointment in 1 week. o Nurse Visit as needed Edema Control Wound #2 Left,Anterior Lower Leg o Compression Pump: Use compression pump on left lower extremity for 60 minutes, twice daily. o Compression Pump: Use compression pump on right lower extremity for 60 minutes, twice daily. Additional Orders / Instructions Wound #2 Left,Anterior Lower Leg o Increase protein intake. Notes Order Renee Pain Electronic Signature(s) Signed: 11/29/2019 3:59:28 PM By: Lenda Kelp PA-C Signed: 12/04/2019 6:25:04 PM By: [name], [name], RN, [name], [name] RN, [name] Entered By: [name], [name], RN, [name], [name] on 11/29/2019 13:41:40 Sevillano, Maysie B. ([id number]) Elmquist, Sacha B. ([id number]) -------------------------------------------------------------------------------- Problem List Details Patient Name: Apsey, Sharlize B. Date of Service: 11/29/2019 1:00 PM Medical Record Number: [id number] Patient Account Number: [id number] Date of Birth/Sex: 1940-06-23 (79 y.o. F) Treating RN: [name] Primary Care Provider: [name] Other Clinician: Referring Provider: [name] Treating Provider/Extender: [name], Asees Manfredi Weeks in Treatment: 1 Active Problems ICD-10 Encounter Code Description Active Date MDM Diagnosis S81.802A Unspecified open wound, left lower leg, initial encounter 11/22/2019 No Yes I87.2 Venous insufficiency (chronic) (peripheral) 12/09/2019 No Yes L97.828 Non-pressure chronic ulcer of other part of left lower leg with other 11/22/2019 No Yes specified severity L03.116 Cellulitis of left lower limb 11/22/2019 No  Yes I48.0 Paroxysmal atrial fibrillation 11/22/2019 No Yes I10 Essential (primary) hypertension 11/22/2019 No Yes I50.42 Chronic combined systolic (congestive) and diastolic (congestive) heart 11/22/2019 No Yes failure Z79.01 Long term (current) use of anticoagulants 11/22/2019 No Yes Inactive Problems Resolved Problems Electronic Signature(s) Signed: 01/10/2020 8:32:44 AM By: Lenda Kelp PA-C Signed: 01/22/2020 11:41:52 AM By: [name], [name], RN, [name], [name] RN, [name] Previous Signature: 12/09/2019 12:52:08 PM Version By: Lenda Kelp PA-C Previous Signature: 11/29/2019 1:08:46 PM Version By: Lenda Kelp PA-C Entered By: [name], [name], RN, [name], [name] on 12/19/2019 16:27:18 Muldoon, Erdine B. ([id number]) -------------------------------------------------------------------------------- Progress Note Details Patient Name: Mehlman, Shaleena B. Date of Service: 11/29/2019 1:00 PM Medical Record Number: [id number] Patient Account Number: [id number] Date of Birth/Sex: 1941/03/13 (79 y.o. F) Treating RN: [name] Primary Care Provider: [name] Other Clinician: Referring Provider: [name] Treating Provider/Extender: [name], Sedalia Greeson Weeks in Treatment: 1 Subjective Chief Complaint Information obtained from Patient Left LE ulcer History of Present Illness (HPI) 06/07/2019 upon evaluation today patient presents today for initial evaluation in our clinic concerning issues she is having with her left lower extremity. She tells me that she hit her leg on a rocking chair when she was getting up and this was roughly at the beginning of December. Dr. Hyacinth Meeker who is her primary care provider has been helping with treating this area since that time. The patient states unfortunately this just is not getting any better. He is attempted salt water soaks as well as using antibiotic ointments topically unfortunately she has not noted much improvement. He wanted her to come to the wound center sooner but she declined although  when things did not seem to be getting better she finally consented to this. Currently the patient does appear to have cellulitis of left lower extremity she also has atrial fibrillation noted for which she is on Eliquis this is probably part of the reason  that she developed a large hematoma that she did. Subsequently she also has hypertension as well as congestive heart failure. The patient does not appear to be febrile today and really is doing fairly well as far as that is concerned although the leg is warm to touch pretty much throughout. No fevers, chills, nausea, vomiting, or diarrhea. She has not been on any oral antibiotics. 06/28/2019 patient actually is seen today in follow-up for her lower extremity ulcer. She is status post having actually spent 9 days in the hospital following when I sent her over for evaluation. They did have to keep her in the hospital and treat her with IV vancomycin secondary to the fact that she unfortunately had a bacteria present that obviously can be managed with oral medication with her penicillin allergy. Nonetheless her leg appears to be doing tremendously better today. There is no signs of active infection and overall very pleased with how things are progressing. I do believe that the wound is also measuring better along with looking much less irritated overall I think she is going to do quite well at this point. 07/05/2019 on evaluation today patient actually appears to be doing quite well with regard to her wound which is measuring smaller and still appears to be doing tremendously better compared to prior evaluation. I do believe the Melburn PopperSantyl has been of benefit for her. 07/12/19 patient's wound actually appears to be doing excellent at this time in regard to the left posterior lower extremity. She has been tolerating the dressing changes without complication. Fortunately there is much improvement noted here there is some slough noted she has been using Santyl I  think that still appropriate. With that being said I am going to go ahead and perform sharp debridement today to clear away some of the necrotic material as well. 07/19/2019 upon evaluation today patient actually appears to be doing quite well in regard to her leg ulcer. She has been using Santyl and has been doing excellent with this up to this point. However I think based on what I am seeing today we can likely proceed to doing something else, collagen, to help with getting this wound to heal more effectively and quickly. 07/26/2019 upon evaluation today patient appears to be doing excellent in regard to her lower extremity ulcer. She has been tolerating the dressing changes without complication. Fortunately the wound is continuing to measure smaller and does seem to be progressing quite nicely. I am very pleased with how things are going at this point. The patient likewise is extremely happy. 08/02/2019 upon evaluation today patient appears to be healing quite nicely and is making excellent progress at this point. Fortunately there is no evidence of active infection at this time which is good news. No fever chills noted 08/09/2019 on evaluation today patient's wound actually is continuing to show signs of excellent improvement. Is measuring much smaller this week even compared to last week and overall I am extremely pleased with the progress. Fortunately there is no evidence of active infection at this time. 08/23/2019 upon evaluation today patient appears to be doing well with regard to her original wound she is showing some signs of improvement here which is great news. Fortunately there is no signs of active infection at this time. No fevers, chills, nausea, vomiting, or diarrhea. 08/30/2019 upon evaluation today patient actually appears to be completely healed. She has little bit of dry skin at the location where the wound was but overall she has done extremely well.  There is no signs of active  infection at this time which is great news. Readmission: 11/22/2019 upon evaluation today patient appears to be doing somewhat poorly in regard to her leg ulcer. She tells me unfortunately that she fell out of bed about 1 month ago and subsequently has been monitoring this since she initially had a hematoma and it sounds like they were trying to get this to reabsorb Dr. Hyacinth Meeker that is her primary care provider. With that being said her podiatrist apparently tried to open this up according to what the patient tells me today and then subsequently gave her an antibiotic for her toe and said that would also help with the area on her leg. They were not able to get anything to come out as her podiatrist stated that this seemed to be too thick in order to drain. With that being said the patient was eventually referred back here to the wound center for further management. 11/29/2019 on evaluation today patient appears to be doing a little bit more poorly today regarding her wound as compared to her last visit here. Again this is due to blistering on the edges of the wound which is very superficial but nonetheless new since last time. This is likely due to the fact that she did have to take off her compression wrap as she was having significant discomfort due to gout that flared up in her foot. Nonetheless she was seen by her primary care provider where she was given a shot of steroids and has new medication for gout that she is now taking. She still having discomfort however she states she does not believe she is good to be able to wear the wrap. Doster, Ande B. ([id number]) Objective Constitutional Well-nourished and well-hydrated in no acute distress. Vitals Time Taken: 1:10 PM, Height: 66 in, Weight: 192 lbs, BMI: 31, Temperature: 97.6 F, Pulse: 75 bpm, Respiratory Rate: 18 breaths/min, Blood Pressure: 135/53 mmHg. Respiratory normal breathing without difficulty. Psychiatric this patient is able to make  decisions and demonstrates good insight into disease process. Alert and Oriented x 3. pleasant and cooperative. General Notes: On inspection patient's wound bed did have some slough noted I did have to perform sharp debridement clear away some of the necrotic debris and she tolerated that today without complication post debridement wound bed appears to be doing much better which is great news. With that being said we still need to control her edema and again I think that potentially Velcro compression wrap could be beneficial for her nonetheless I think she is can have to utilize something. Also do believe she should be using her lymphedema pumps I had actually forgotten that she had these that something that she is going to try to initiate again as well. Integumentary (Hair, Skin) Wound #2 status is Open. Original cause of wound was Trauma. The wound is located on the Left,Anterior Lower Leg. The wound measures 5.5cm length x 4cm width x 0.1cm depth; 17.279cm^2 area and 1.728cm^3 volume. There is Fat Layer (Subcutaneous Tissue) Exposed exposed. There is a medium amount of serosanguineous drainage noted. The wound margin is flat and intact. There is small (1-33%) red granulation within the wound bed. There is a large (67-100%) amount of necrotic tissue within the wound bed including Eschar and Adherent Slough. Assessment Active Problems ICD-10 Unspecified open wound, left lower leg, initial encounter Venous insufficiency (chronic) (peripheral) Non-pressure chronic ulcer of other part of left lower leg with other specified severity Cellulitis of left lower limb  Paroxysmal atrial fibrillation Essential (primary) hypertension Chronic combined systolic (congestive) and diastolic (congestive) heart failure Long term (current) use of anticoagulants Procedures Wound #2 Pre-procedure diagnosis of Wound #2 is a Trauma, Other located on the Left,Anterior Lower Leg . There was a Excisional  Skin/Subcutaneous Tissue Debridement with a total area of 12 sq cm performed by STONE III, Leeum Sankey E., PA-C. With the following instrument(s): Curette to remove Viable and Non-Viable tissue/material. Material removed includes Subcutaneous Tissue, Slough, and Biofilm after achieving pain control using Lidocaine. No specimens were taken. A time out was conducted at 13:33, prior to the start of the procedure. A Moderate amount of bleeding was controlled with Pressure. The procedure was tolerated well. Post Debridement Measurements: 5.5cm length x 4cm width x 0.2cm depth; 3.456cm^3 volume. Character of Wound/Ulcer Post Debridement is stable. Post procedure Diagnosis Wound #2: Same as Pre-Procedure Mcmartin, Cagney B. ([id number]) Plan Anesthetic (add to Medication List): Wound #2 Left,Anterior Lower Leg: Topical Lidocaine 4% cream applied to wound bed prior to debridement (In Clinic Only). Primary Wound Dressing: Wound #2 Left,Anterior Lower Leg: Silver Alginate Secondary Dressing: ABD and Kerlix/Conform Dressing Change Frequency: Wound #2 Left,Anterior Lower Leg: Change Dressing Monday, Wednesday, Friday Follow-up Appointments: Wound #2 Left,Anterior Lower Leg: Return Appointment in 1 week. Nurse Visit as needed Edema Control: Wound #2 Left,Anterior Lower Leg: Compression Pump: Use compression pump on left lower extremity for 60 minutes, twice daily. Compression Pump: Use compression pump on right lower extremity for 60 minutes, twice daily. Additional Orders / Instructions: Wound #2 Left,Anterior Lower Leg: Increase protein intake. General Notes: Order Renee Pain 1. I would recommend currently that we go ahead and initiate treatment with a continuation of the silver alginate dressing. We will also continue to utilize her compression stockings for the time being we did order her Velcro compression wraps to see if we get a little better compression for now. 2. I am also going to recommend that  she needs to use her lymphedema pumps preferably twice a day on a regular basis as discussed during the office visit today. 3. I am also can recommend that we go ahead and have the patient continue to try to elevate her legs is much as possible we discussed this more last week as well obviously I think that still of utmost importance. We will see patient back for reevaluation in 1 week here in the clinic. If anything worsens or changes patient will contact our office for additional recommendations. This will be for a nurse visit and I will see her in 2 weeks. Electronic Signature(s) Signed: 12/09/2019 12:52:28 PM By: Lenda Kelp PA-C Previous Signature: 11/29/2019 1:52:37 PM Version By: Lenda Kelp PA-C Entered By: Lenda Kelp on 12/09/2019 12:52:28 Barrington, Mercadez B. ([id number]) -------------------------------------------------------------------------------- SuperBill Details Patient Name: Boule, Jinny B. Date of Service: 11/29/2019 Medical Record Number: [id number] Patient Account Number: [id number] Date of Birth/Sex: 1940/09/15 (79 y.o. 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 Long term (current) use of anticoagulants Facility Procedures CPT4 Code: 00349179 Description: 11042 - DEB SUBQ TISSUE  20 SQ CM/< Modifier: Quantity: 1 CPT4 Code: Description: ICD-10 Diagnosis Description  L97.828 Non-pressure chronic ulcer of other part of left lower leg with other spec Modifier: ified severity Quantity: Physician Procedures CPT4 Code: 6063016 Description: 11042 - WC PHYS SUBQ TISS 20 SQ CM Modifier: Quantity: 1 CPT4 Code: Description: ICD-10 Diagnosis Description L97.828 Non-pressure chronic ulcer of other part of left lower leg with other spec Modifier: ified severity Quantity: Electronic Signature(s) Signed: 12/09/2019 12:53:04 PM By: Lenda Kelp PA-C Previous Signature: 11/29/2019 1:53:20 PM Version By: Lenda Kelp PA-C Entered By: Lenda Kelp on 12/09/2019 12:53:04

## 2019-12-06 ENCOUNTER — Other Ambulatory Visit: Payer: Self-pay

## 2019-12-06 DIAGNOSIS — S81802A Unspecified open wound, left lower leg, initial encounter: Secondary | ICD-10-CM | POA: Diagnosis not present

## 2019-12-06 NOTE — Progress Notes (Signed)
Lauder, Nakoma B. ([id number]) Visit Report for 12/06/2019 Arrival Information Details Patient Name: [name], [name] Date of Service: 12/06/2019 11:45 AM Medical Record Number: [id number] Patient Account Number: [id number] Date of Birth/Sex: [birth year] (79 y.o. F) Treating RN: [name] Primary Care Carleigh Buccieri: [name] Other Clinician: Referring Donalyn Schneeberger: [name] Treating Tobey Schmelzle/Extender: Altamese [place] in Treatment: 2 Visit Information History Since Last Visit Added or deleted any medications: No Patient Arrived: Ambulatory Has Dressing in Place as Prescribed: Yes Arrival Time: 11:46 Pain Present Now: No Accompanied By: self Transfer Assistance: None Patient Identification Verified: Yes Secondary Verification Process Completed: Yes Electronic Signature(s) Signed: 12/06/2019 12:09:22 PM By: [name], [name], RN, [name], [name] RN, [name] Entered By: [name], [name], RN, [name], [name] on 12/06/2019 11:46:55 [name], [name] ([id number]) -------------------------------------------------------------------------------- Clinic Level of Care Assessment Details Patient Name: [name], [name] Date of Service: 12/06/2019 11:45 AM Medical Record Number: [id number] Patient Account Number: [id number] Date of Birth/Sex: 1941-03-06 (79 y.o. F) Treating RN: [name] Primary Care Karlisha Mathena: [name] Other Clinician: Referring Angle Dirusso: [name] Treating Mariyanna Mucha/Extender: Altamese [place] in Treatment: 2 Clinic Level of Care Assessment Items TOOL 4 Quantity Score []  - Use when only an EandM is performed on FOLLOW-UP visit 0 ASSESSMENTS - Nursing Assessment / Reassessment X - Reassessment of Co-morbidities (includes updates in patient status) 1 10 X- 1 5 Reassessment of Adherence to Treatment Plan ASSESSMENTS - Wound and Skin Assessment / Reassessment X - Simple Wound Assessment / Reassessment - one wound 1 5 []  - 0 Complex Wound Assessment / Reassessment - multiple wounds []  - 0 Dermatologic /  Skin Assessment (not related to wound area) ASSESSMENTS - Focused Assessment []  - Circumferential Edema Measurements - multi extremities 0 []  - 0 Nutritional Assessment / Counseling / Intervention []  - 0 Lower Extremity Assessment (monofilament, tuning fork, pulses) []  - 0 Peripheral Arterial Disease Assessment (using hand held doppler) ASSESSMENTS - Ostomy and/or Continence Assessment and Care []  - Incontinence Assessment and Management 0 []  - 0 Ostomy Care Assessment and Management (repouching, etc.) PROCESS - Coordination of Care X - Simple Patient / Family Education for ongoing care 1 15 []  - 0 Complex (extensive) Patient / Family Education for ongoing care []  - 0 Staff obtains , Records, Test Results / Process Orders []  - 0 Staff telephones HHA, Nursing Homes / Clarify orders / etc []  - 0 Routine Transfer to another Facility (non-emergent condition) []  - 0 Routine Hospital Admission (non-emergent condition) []  - 0 New Admissions / / Ordering NPWT, Apligraf, etc. []  - 0 Emergency Hospital Admission (emergent condition) X- 1 10 Simple Discharge Coordination []  - 0 Complex (extensive) Discharge Coordination PROCESS - Special Needs []  - Pediatric / Minor Patient Management 0 []  - 0 Isolation Patient Management []  - 0 Hearing / Language / Visual special needs []  - 0 Assessment of Community assistance (transportation, D/C planning, etc.) []  - 0 Additional assistance / Altered mentation []  - 0 Support Surface(s) Assessment (bed, cushion, seat, etc.) INTERVENTIONS - Wound Cleansing / Measurement [name], [name] ( ) X- 1 5 Simple Wound Cleansing - one wound []  - 0 Complex Wound Cleansing - multiple wounds X- 1 5 Wound Imaging (photographs - any number of wounds) []  - 0 Wound Tracing (instead of photographs) X- 1 5 Simple Wound Measurement - one wound []  - 0 Complex Wound Measurement - multiple wounds INTERVENTIONS - Wound  Dressings []  - Small Wound Dressing one or multiple wounds 0 X- 1 15 Medium Wound  Dressing one or multiple wounds []  - 0 Large Wound Dressing one or multiple wounds []  - 0 Application of Medications - topical []  - 0 Application of Medications - injection INTERVENTIONS - Miscellaneous []  - External ear exam 0 []  - 0 Specimen Collection (cultures, biopsies, blood, body fluids, etc.) []  - 0 Specimen(s) / Culture(s) sent or taken to Lab for analysis []  - 0 Patient Transfer (multiple staff / / Similar devices) []  - 0 Simple Staple / Suture removal (25 or less) []  - 0 Complex Staple / Suture removal (26 or more) []  - 0 Hypo / Hyperglycemic Management (close monitor of Blood Glucose) []  - 0 Ankle / Brachial Index (ABI) - do not check if billed separately []  - 0 Vital Signs Has the patient been seen at the hospital within the last three years: Yes Total Score: 75 Level Of Care: New/Established - Level 2 Electronic Signature(s) Signed: 12/06/2019 12:09:22 PM By: , [name], RN, [name], [name] RN, [name] Entered By: , [name], RN, [name], [name] on 12/06/2019 11:55:39 [name], [name] ( ) -------------------------------------------------------------------------------- Encounter Discharge Information Details Patient Name: [name], [name] Date of Service: 12/06/2019 11:45 AM Medical Record Number: Nurse, adult Patient Account Number: Date of Birth/Sex: 02-09-1941 (79 y.o. F) Treating RN: Primary Care Jerney Baksh: Other Clinician: Referring Zaliyah Meikle: 12/08/2019 Treating Kajsa Butrum/Extender: [name] in Treatment: 2 Encounter Discharge Information Items Discharge Condition: Stable Ambulatory Status: Ambulatory Discharge Destination: Home Transportation: Private Auto Accompanied By: self Schedule Follow-up Appointment: Yes Clinical Summary of Care: Electronic Signature(s) Signed: 12/06/2019 12:09:22 PM By: 12/08/2019, [name], RN, [name], [name] RN,  [name] Entered By: [id number], [name], RN, [name], [name] on 12/06/2019 11:55:20 [name], [name] ([id number]) -------------------------------------------------------------------------------- Wound Assessment Details Patient Name: [name], [name] Date of Service: 12/06/2019 11:45 AM Medical Record Number: [id number]/21/[id number] Patient Account Number: [id number] Date of Birth/Sex: January 16, 1941 (79 y.o. F) Treating RN: [name] Primary Care Kaio Kuhlman: Altamese [place] Other Clinician: Referring Taya Ashbaugh: 12/08/2019 Treating Shawnie Nicole/Extender: [name] in Treatment: 2 Wound Status Wound Number: 2 Primary Trauma, Other Etiology: Wound Location: Left, Anterior Lower Leg Wound Status: Open Wounding Event: Trauma Comorbid Cataracts, Arrhythmia, Congestive Heart Failure, Date Acquired: 10/22/2019 History: Hypertension, Gout Weeks Of Treatment: 2 Clustered Wound: No Photos Wound Measurements Length: (cm) 3.2 Width: (cm) 2.2 Depth: (cm) 0.1 Area: (cm) 5.529 Volume: (cm) 0.553 % Reduction in Area: 21.8% % Reduction in Volume: 21.8% Epithelialization: Small (1-33%) Tunneling: No Undermining: No Wound Description Classification: Full Thickness Without Exposed Support Structu Wound Margin: Flat and Intact Exudate Amount: Medium Exudate Type: Serosanguineous Exudate Color: red, brown res Foul Odor After Cleansing: No Slough/Fibrino Yes Wound Bed Granulation Amount: Medium (34-66%) Exposed Structure Granulation Quality: Red Fascia Exposed: No Necrotic Amount: Medium (34-66%) Fat Layer (Subcutaneous Tissue) Exposed: Yes Necrotic Quality: Eschar, Adherent Slough Tendon Exposed: No Muscle Exposed: No Joint Exposed: No Bone Exposed: No Treatment Notes Wound #2 (Left, Anterior Lower Leg) Notes Silver cell Electronic Signature(s) Signed: 12/06/2019 12:09:22 PM By: [id number], [name], RN, [name], [name] RN, [name] [name], [name] (12/08/2019) Entered By: [id number], [name], RN, [name], [name] on 12/06/2019 11:49:51

## 2019-12-11 NOTE — Progress Notes (Signed)
Pellegrini, Angellee B. ([id number]) Visit Report for 12/06/2019 Physician Orders Details Patient Name: Woodcox, Anaalicia B. Date of Service: 12/06/2019 11:45 AM Medical Record Number: [id number] Patient Account Number: [id number] Date of Birth/Sex: June 09, 1940 (79 y.o. F) Treating RN: [name] Primary Care Provider: [name] Other Clinician: Referring Provider: [name] Treating Provider/Extender: Altamese [place] in Treatment: 2 Verbal / Phone Orders: No Diagnosis Coding Anesthetic (add to Medication List) Wound #2 Left,Anterior Lower Leg o Topical Lidocaine 4% cream applied to wound bed prior to debridement (In Clinic Only). Primary Wound Dressing Wound #2 Left,Anterior Lower Leg o Silver Alginate Secondary Dressing o ABD and Kerlix/Conform Dressing Change Frequency Wound #2 Left,Anterior Lower Leg o Change Dressing Monday, Wednesday, Friday Follow-up Appointments Wound #2 Left,Anterior Lower Leg o Return Appointment in 1 week. o Nurse Visit as needed Edema Control Wound #2 Left,Anterior Lower Leg o Compression Pump: Use compression pump on left lower extremity for 60 minutes, twice daily. o Compression Pump: Use compression pump on right lower extremity for 60 minutes, twice daily. Additional Orders / Instructions Wound #2 Left,Anterior Lower Leg o Increase protein intake. Electronic Signature(s) Signed: 12/06/2019 12:09:22 PM By: [name], [name], RN, [name], [name] RN, [name] Signed: 12/11/2019 4:41:04 PM By: Baltazar Najjar MD Entered By: [name], [name], RN, [name], [name] on 12/06/2019 11:51:17 [place], Blima B. ([id number]) -------------------------------------------------------------------------------- SuperBill Details Patient Name: Goswami, Margret B. Date of Service: 12/06/2019 Medical Record Number: [id number] Patient Account Number: [id number] Date of Birth/Sex: [birth year] (79 y.o. 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

## 2019-12-13 ENCOUNTER — Ambulatory Visit: Payer: Medicare Other | Admitting: Physician Assistant

## 2019-12-19 ENCOUNTER — Encounter: Payer: Medicare Other | Admitting: Physician Assistant

## 2019-12-19 ENCOUNTER — Other Ambulatory Visit: Payer: Self-pay

## 2019-12-19 DIAGNOSIS — S81802A Unspecified open wound, left lower leg, initial encounter: Secondary | ICD-10-CM | POA: Diagnosis not present

## 2019-12-19 NOTE — Progress Notes (Addendum)
[name], [name] ([id number]) Visit Report for 12/19/2019 Chief Complaint Document Details Patient Name: [name], [name] Date of Service: 12/19/2019 3:45 PM Medical Record Number: [id number] Patient Account Number: [id number] Date of Birth/Sex: Aug 01, 1940 (79 y.o. F) Treating RN: [name] Primary Care Provider: [name] Other Clinician: Referring Provider: [name] Treating Provider/Extender: [name], Adaleen Hulgan Weeks in Treatment: 3 Information Obtained from: Patient Chief Complaint Left LE ulcer Electronic Signature(s) Signed: 12/19/2019 4:20:48 PM By: Lenda Kelp PA-C Entered By: Lenda Kelp on 12/19/2019 16:20:47 Radloff, Shlonda B. ([id number]) -------------------------------------------------------------------------------- HPI Details Patient Name: [name], [name] Date of Service: 12/19/2019 3:45 PM Medical Record Number: [id number] Patient Account Number: [id number] Date of Birth/Sex: 31-Oct-1940 (79 y.o. F) Treating RN: [name] Primary Care Provider: [name] Other Clinician: Referring Provider: [name] Treating Provider/Extender: [name], Omare Bilotta Weeks in Treatment: 3 History of Present Illness HPI Description: 06/07/2019 upon evaluation today patient presents today for initial evaluation in our clinic concerning issues she is having with her left lower extremity. She tells me that she hit her leg on a rocking chair when she was getting up and this was roughly at the beginning of December. Dr. Hyacinth Meeker who is her primary care provider has been helping with treating this area since that time. The patient states unfortunately this just is not getting any better. He is attempted salt water soaks as well as using antibiotic ointments topically unfortunately she has not noted much improvement. He wanted her to come to the wound center sooner but she declined although when things did not seem to be getting better she finally consented to this. Currently the patient does appear to  have cellulitis of left lower extremity she also has atrial fibrillation noted for which she is on Eliquis this is probably part of the reason that she developed a large hematoma that she did. Subsequently she also has hypertension as well as congestive heart failure. The patient does not appear to be febrile today and really is doing fairly well as far as that is concerned although the leg is warm to touch pretty much throughout. No fevers, chills, nausea, vomiting, or diarrhea. She has not been on any oral antibiotics. 06/28/2019 patient actually is seen today in follow-up for her lower extremity ulcer. She is status post having actually spent 9 days in the hospital following when I sent her over for evaluation. They did have to keep her in the hospital and treat her with IV vancomycin secondary to the fact that she unfortunately had a bacteria present that obviously can be managed with oral medication with her penicillin allergy. Nonetheless her leg appears to be doing tremendously better today. There is no signs of active infection and overall very pleased with how things are progressing. I do believe that the wound is also measuring better along with looking much less irritated overall I think she is going to do quite well at this point. 07/05/2019 on evaluation today patient actually appears to be doing quite well with regard to her wound which is measuring smaller and still appears to be doing tremendously better compared to prior evaluation. I do believe the Melburn Popper has been of benefit for her. 07/12/19 patient's wound actually appears to be doing excellent at this time in regard to the left posterior lower extremity. She has been tolerating the dressing changes without complication. Fortunately there is much improvement noted here there is some slough noted she has been using Santyl I think that still appropriate. With that  being said I am going to go ahead and perform sharp debridement today to  clear away some of the necrotic material as well. 07/19/2019 upon evaluation today patient actually appears to be doing quite well in regard to her leg ulcer. She has been using Santyl and has been doing excellent with this up to this point. However I think based on what I am seeing today we can likely proceed to doing something else, collagen, to help with getting this wound to heal more effectively and quickly. 07/26/2019 upon evaluation today patient appears to be doing excellent in regard to her lower extremity ulcer. She has been tolerating the dressing changes without complication. Fortunately the wound is continuing to measure smaller and does seem to be progressing quite nicely. I am very pleased with how things are going at this point. The patient likewise is extremely happy. 08/02/2019 upon evaluation today patient appears to be healing quite nicely and is making excellent progress at this point. Fortunately there is no evidence of active infection at this time which is good news. No fever chills noted 08/09/2019 on evaluation today patient's wound actually is continuing to show signs of excellent improvement. Is measuring much smaller this week even compared to last week and overall I am extremely pleased with the progress. Fortunately there is no evidence of active infection at this time. 08/23/2019 upon evaluation today patient appears to be doing well with regard to her original wound she is showing some signs of improvement here which is great news. Fortunately there is no signs of active infection at this time. No fevers, chills, nausea, vomiting, or diarrhea. 08/30/2019 upon evaluation today patient actually appears to be completely healed. She has little bit of dry skin at the location where the wound was but overall she has done extremely well. There is no signs of active infection at this time which is great news. Readmission: 11/22/2019 upon evaluation today patient appears to be doing  somewhat poorly in regard to her leg ulcer. She tells me unfortunately that she fell out of bed about 1 month ago and subsequently has been monitoring this since she initially had a hematoma and it sounds like they were trying to get this to reabsorb Dr. Hyacinth MeekerMiller that is her primary care provider. With that being said her podiatrist apparently tried to open this up according to what the patient tells me today and then subsequently gave her an antibiotic for her toe and said that would also help with the area on her leg. They were not able to get anything to come out as her podiatrist stated that this seemed to be too thick in order to drain. With that being said the patient was eventually referred back here to the wound center for further management. 11/29/2019 on evaluation today patient appears to be doing a little bit more poorly today regarding her wound as compared to her last visit here. Again this is due to blistering on the edges of the wound which is very superficial but nonetheless new since last time. This is likely due to the fact that she did have to take off her compression wrap as she was having significant discomfort due to gout that flared up in her foot. Nonetheless she was seen by her primary care provider where she was given a shot of steroids and has new medication for gout that she is now taking. She still having discomfort however she states she does not believe she is good to be able to  wear the wrap. 12/19/2019 on evaluation today patient actually appears to be making excellent progress in regard to her wound. She has been tolerating the dressing changes without complication. Fortunately there is no signs of active infection at this time. Rick, Karys B. ([id number]) Electronic Signature(s) Signed: 12/20/2019 5:08:13 PM By: Lenda Kelp PA-C Entered By: Lenda Kelp on 12/20/2019 17:08:13 Harder, Jil B.  ([id number]) -------------------------------------------------------------------------------- Physical Exam Details Patient Name: Shearman, Keziah B. Date of Service: 12/19/2019 3:45 PM Medical Record Number: [id number] Patient Account Number: [id number] Date of Birth/Sex: 04-25-1941 (79 y.o. F) Treating RN: [name] Primary Care Provider: [name] Other Clinician: Referring Provider: [name] Treating Provider/Extender: [name], Khaiden Segreto Weeks in Treatment: 3 Constitutional Well-nourished and well-hydrated in no acute distress. Respiratory normal breathing without difficulty. Psychiatric this patient is able to make decisions and demonstrates good insight into disease process. Alert and Oriented x 3. pleasant and cooperative. Notes Upon inspection patient's wound bed actually showed signs of excellent epithelization and granulation there is no need for sharp debridement today I did perform mechanical debridement just removal a slight amount of slough she tolerated that with no pain and overall I am very pleased with where things stand. Electronic Signature(s) Signed: 12/20/2019 5:08:30 PM By: Lenda Kelp PA-C Entered By: Lenda Kelp on 12/20/2019 17:08:29 Kingston, Chanise B. ([id number]) -------------------------------------------------------------------------------- Physician Orders Details Patient Name: Nottingham, Kameshia B. Date of Service: 12/19/2019 3:45 PM Medical Record Number: [id number] Patient Account Number: [id number] Date of Birth/Sex: 1940-06-30 (79 y.o. F) Treating RN: [name] Primary Care Provider: [name] Other Clinician: Referring Provider: [name] Treating Provider/Extender: [name], Shauntea Lok Weeks in Treatment: 3 Verbal / Phone Orders: No Diagnosis Coding ICD-10 Coding Code Description [phone] Unspecified open wound, left lower leg, initial encounter I87.2 Venous insufficiency (chronic) (peripheral) L97.828 Non-pressure chronic ulcer of other part of  left lower leg with other specified severity L03.116 Cellulitis of left lower limb I48.0 Paroxysmal atrial fibrillation I10 Essential (primary) hypertension I50.42 Chronic combined systolic (congestive) and diastolic (congestive) heart failure Z79.01 Long term (current) use of anticoagulants Anesthetic (add to Medication List) Wound #2 Left,Anterior Lower Leg o Topical Lidocaine 4% cream applied to wound bed prior to debridement (In Clinic Only). Primary Wound Dressing Wound #2 Left,Anterior Lower Leg o Silver Alginate Secondary Dressing o ABD and Kerlix/Conform Dressing Change Frequency Wound #2 Left,Anterior Lower Leg o Change Dressing Monday, Wednesday, Friday Follow-up Appointments Wound #2 Left,Anterior Lower Leg o Return Appointment in 1 week. o Nurse Visit as needed Edema Control Wound #2 Left,Anterior Lower Leg o Patient to wear own compression stockings o Compression Pump: Use compression pump on left lower extremity for 60 minutes, twice daily. o Compression Pump: Use compression pump on right lower extremity for 60 minutes, twice daily. Additional Orders / Instructions Wound #2 Left,Anterior Lower Leg o Increase protein intake. Notes Order Fabian November 4000 Electronic Signature(s) Signed: 12/19/2019 5:01:28 PM By: Lenda Kelp PA-C Signed: 12/19/2019 6:25:55 PM By: [name], [name], RN, [name], [name] RN, [name] [place], Demi B. ([id number]) Entered By: [name], [name], RN, [name], [name] on 12/19/2019 16:31:19 Whan, Avalene B. ([id number]) -------------------------------------------------------------------------------- Problem List Details Patient Name: Bowery, Ross B. Date of Service: 12/19/2019 3:45 PM Medical Record Number: [id number] Patient Account Number: [id number] Date of Birth/Sex: Jan 18, 1941 (79 y.o. F) Treating RN: [name] Primary Care Provider: [name] Other Clinician: Referring Provider: [name] Treating Provider/Extender: [name], Tasia Liz Weeks in  Treatment: 3 Active Problems ICD-10 Encounter Code Description Active Date MDM Diagnosis S81.802A Unspecified  open wound, left lower leg, initial encounter 11/22/2019 No Yes I87.2 Venous insufficiency (chronic) (peripheral) 12/09/2019 No Yes L97.828 Non-pressure chronic ulcer of other part of left lower leg with other 11/22/2019 No Yes specified severity L03.116 Cellulitis of left lower limb 11/22/2019 No Yes I48.0 Paroxysmal atrial fibrillation 11/22/2019 No Yes I10 Essential (primary) hypertension 11/22/2019 No Yes I50.42 Chronic combined systolic (congestive) and diastolic (congestive) heart 11/22/2019 No Yes failure Z79.01 Long term (current) use of anticoagulants 11/22/2019 No Yes Inactive Problems Resolved Problems Electronic Signature(s) Signed: 12/19/2019 4:01:29 PM By: Lenda Kelp PA-C Entered By: Lenda Kelp on 12/19/2019 16:01:28 Masullo, Adriauna B. ([id number]) -------------------------------------------------------------------------------- Progress Note Details Patient Name: Haithcock, Ericca B. Date of Service: 12/19/2019 3:45 PM Medical Record Number: [id number] Patient Account Number: [id number] Date of Birth/Sex: 13-Jan-1941 (79 y.o. F) Treating RN: [name] Primary Care Provider: [name] Other Clinician: Referring Provider: [name] Treating Provider/Extender: [name], Amarra Sawyer Weeks in Treatment: 3 Subjective Chief Complaint Information obtained from Patient Left LE ulcer History of Present Illness (HPI) 06/07/2019 upon evaluation today patient presents today for initial evaluation in our clinic concerning issues she is having with her left lower extremity. She tells me that she hit her leg on a rocking chair when she was getting up and this was roughly at the beginning of December. Dr. Hyacinth Meeker who is her primary care provider has been helping with treating this area since that time. The patient states unfortunately this just is not getting any better. He is attempted salt  water soaks as well as using antibiotic ointments topically unfortunately she has not noted much improvement. He wanted her to come to the wound center sooner but she declined although when things did not seem to be getting better she finally consented to this. Currently the patient does appear to have cellulitis of left lower extremity she also has atrial fibrillation noted for which she is on Eliquis this is probably part of the reason that she developed a large hematoma that she did. Subsequently she also has hypertension as well as congestive heart failure. The patient does not appear to be febrile today and really is doing fairly well as far as that is concerned although the leg is warm to touch pretty much throughout. No fevers, chills, nausea, vomiting, or diarrhea. She has not been on any oral antibiotics. 06/28/2019 patient actually is seen today in follow-up for her lower extremity ulcer. She is status post having actually spent 9 days in the hospital following when I sent her over for evaluation. They did have to keep her in the hospital and treat her with IV vancomycin secondary to the fact that she unfortunately had a bacteria present that obviously can be managed with oral medication with her penicillin allergy. Nonetheless her leg appears to be doing tremendously better today. There is no signs of active infection and overall very pleased with how things are progressing. I do believe that the wound is also measuring better along with looking much less irritated overall I think she is going to do quite well at this point. 07/05/2019 on evaluation today patient actually appears to be doing quite well with regard to her wound which is measuring smaller and still appears to be doing tremendously better compared to prior evaluation. I do believe the Melburn Popper has been of benefit for her. 07/12/19 patient's wound actually appears to be doing excellent at this time in regard to the left posterior  lower extremity. She has been tolerating the dressing changes  without complication. Fortunately there is much improvement noted here there is some slough noted she has been using Santyl I think that still appropriate. With that being said I am going to go ahead and perform sharp debridement today to clear away some of the necrotic material as well. 07/19/2019 upon evaluation today patient actually appears to be doing quite well in regard to her leg ulcer. She has been using Santyl and has been doing excellent with this up to this point. However I think based on what I am seeing today we can likely proceed to doing something else, collagen, to help with getting this wound to heal more effectively and quickly. 07/26/2019 upon evaluation today patient appears to be doing excellent in regard to her lower extremity ulcer. She has been tolerating the dressing changes without complication. Fortunately the wound is continuing to measure smaller and does seem to be progressing quite nicely. I am very pleased with how things are going at this point. The patient likewise is extremely happy. 08/02/2019 upon evaluation today patient appears to be healing quite nicely and is making excellent progress at this point. Fortunately there is no evidence of active infection at this time which is good news. No fever chills noted 08/09/2019 on evaluation today patient's wound actually is continuing to show signs of excellent improvement. Is measuring much smaller this week even compared to last week and overall I am extremely pleased with the progress. Fortunately there is no evidence of active infection at this time. 08/23/2019 upon evaluation today patient appears to be doing well with regard to her original wound she is showing some signs of improvement here which is great news. Fortunately there is no signs of active infection at this time. No fevers, chills, nausea, vomiting, or diarrhea. 08/30/2019 upon evaluation today  patient actually appears to be completely healed. She has little bit of dry skin at the location where the wound was but overall she has done extremely well. There is no signs of active infection at this time which is great news. Readmission: 11/22/2019 upon evaluation today patient appears to be doing somewhat poorly in regard to her leg ulcer. She tells me unfortunately that she fell out of bed about 1 month ago and subsequently has been monitoring this since she initially had a hematoma and it sounds like they were trying to get this to reabsorb Dr. Hyacinth MeekerMiller that is her primary care provider. With that being said her podiatrist apparently tried to open this up according to what the patient tells me today and then subsequently gave her an antibiotic for her toe and said that would also help with the area on her leg. They were not able to get anything to come out as her podiatrist stated that this seemed to be too thick in order to drain. With that being said the patient was eventually referred back here to the wound center for further management. 11/29/2019 on evaluation today patient appears to be doing a little bit more poorly today regarding her wound as compared to her last visit here. Again this is due to blistering on the edges of the wound which is very superficial but nonetheless new since last time. This is likely due to the fact that she did have to take off her compression wrap as she was having significant discomfort due to gout that flared up in her foot. Nonetheless she was seen by her primary care provider where she was given a shot of steroids and has new medication  for gout that she is now taking. She still having discomfort however she states she does not believe she is good to be able to wear the wrap. [place], Urania B. ([id number]) 12/19/2019 on evaluation today patient actually appears to be making excellent progress in regard to her wound. She has been tolerating the dressing changes  without complication. Fortunately there is no signs of active infection at this time. Objective Constitutional Well-nourished and well-hydrated in no acute distress. Vitals Time Taken: 3:45 AM, Height: 66 in, Weight: 192 lbs, BMI: 31, Temperature: 97.6 F, Pulse: 69 bpm, Respiratory Rate: 18 breaths/min, Blood Pressure: 141/58 mmHg. Respiratory normal breathing without difficulty. Psychiatric this patient is able to make decisions and demonstrates good insight into disease process. Alert and Oriented x 3. pleasant and cooperative. General Notes: Upon inspection patient's wound bed actually showed signs of excellent epithelization and granulation there is no need for sharp debridement today I did perform mechanical debridement just removal a slight amount of slough she tolerated that with no pain and overall I am very pleased with where things stand. Integumentary (Hair, Skin) Wound #2 status is Open. Original cause of wound was Trauma. The wound is located on the Left,Anterior Lower Leg. The wound measures 2.6cm length x 2cm width x 0.1cm depth; 4.084cm^2 area and 0.408cm^3 volume. There is Fat Layer (Subcutaneous Tissue) Exposed exposed. There is a medium amount of serosanguineous drainage noted. The wound margin is flat and intact. There is medium (34-66%) red granulation within the wound bed. There is a medium (34-66%) amount of necrotic tissue within the wound bed including Adherent Slough. Assessment Active Problems ICD-10 Unspecified open wound, left lower leg, initial encounter Venous insufficiency (chronic) (peripheral) Non-pressure chronic ulcer of other part of left lower leg with other specified severity Cellulitis of left lower limb Paroxysmal atrial fibrillation Essential (primary) hypertension Chronic combined systolic (congestive) and diastolic (congestive) heart failure Long term (current) use of anticoagulants Procedures Wound #2 Pre-procedure diagnosis of Wound #2 is  a Trauma, Other located on the Left,Anterior Lower Leg . There was a Three Layer Compression Therapy Procedure with a pre-treatment ABI of 1.2 by [name], RN. Post procedure Diagnosis Wound #2: Same as Pre-Procedure Plan Anesthetic (add to Medication List): Stgermaine, Adya B. ([id number]) Wound #2 Left,Anterior Lower Leg: Topical Lidocaine 4% cream applied to wound bed prior to debridement (In Clinic Only). Primary Wound Dressing: Wound #2 Left,Anterior Lower Leg: Silver Alginate Secondary Dressing: ABD and Kerlix/Conform Dressing Change Frequency: Wound #2 Left,Anterior Lower Leg: Change Dressing Monday, Wednesday, Friday Follow-up Appointments: Wound #2 Left,Anterior Lower Leg: Return Appointment in 1 week. Nurse Visit as needed Edema Control: Wound #2 Left,Anterior Lower Leg: Patient to wear own compression stockings Compression Pump: Use compression pump on left lower extremity for 60 minutes, twice daily. Compression Pump: Use compression pump on right lower extremity for 60 minutes, twice daily. Additional Orders / Instructions: Wound #2 Left,Anterior Lower Leg: Increase protein intake. General Notes: Order [place] 4000 1. I would recommend we continue with wound care measures as before she is in agreement this plan. We will subsequently continue to monitor for any signs of worsening but as far as I can tell and see at this point I think she is progressing quite nicely. We are can order her Velcro compression wrap. 2. For the time being we are going to actually have her utilize the Tubigrip which has helped her up to this point we were able to wrap her with a true compression wrap due to the  fact that unfortunately she was having a flareup of gout and subsequently she is doing so well and not can go back to wrap at this point. We will see patient back for reevaluation in 1 week here in the clinic. If anything worsens or changes patient will contact our office for  additional recommendations. Electronic Signature(s) Signed: 12/20/2019 5:09:22 PM By: Lenda Kelp PA-C Entered By: Lenda Kelp on 12/20/2019 17:09:22 Celmer, Estephania B. ([id number]) -------------------------------------------------------------------------------- SuperBill Details Patient Name: Paisley, Rasheeda B. Date of Service: 12/19/2019 Medical Record Number: [id number] Patient Account Number: [id number] Date of Birth/Sex: 20-Jun-1940 (79 y.o. 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 Long term (current) use of anticoagulants Facility Procedures CPT4 Code: 95284132 Description: 99213 - WOUND CARE VISIT-LEV 3 EST PT Modifier: Quantity: 1 Physician Procedures CPT4 Code: 4401027 Description: 99213 - WC PHYS LEVEL 3 - EST PT Modifier: Quantity: 1 CPT4 Code: Description: ICD-10 Diagnosis Description S81.802A Unspecified open wound, left lower leg, initial encounter I87.2 Venous insufficiency (chronic) (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

## 2019-12-20 NOTE — Progress Notes (Signed)
[name], [name] ([id number]) Visit Report for 12/19/2019 Arrival Information Details Patient Name: [name], [name] Date of Service: 12/19/2019 3:45 PM Medical Record Number: [id number] Patient Account Number: [id number] Date of Birth/Sex: November 28, 1940 (79 y.o. F) Treating RN: [name] Primary Care Carolene Gitto: [name] Other Clinician: Referring Jalyn Dutta: [name] Treating Bayden Gil/Extender: [name], [name] Weeks in Treatment: 3 Visit Information History Since Last Visit All ordered tests and consults were completed: No Patient Arrived: Ambulatory Added or deleted any medications: No Arrival Time: 15:54 Any new allergies or adverse reactions: No Accompanied By: friend Had a fall or experienced change in No Transfer Assistance: None activities of daily living that may affect Patient Identification Verified: Yes risk of falls: Secondary Verification Process Completed: Yes Signs or symptoms of abuse/neglect since last visito No Patient Requires Transmission-Based Precautions: No Hospitalized since last visit: No Patient Has Alerts: No Implantable device outside of the clinic excluding No cellular tissue based products placed in the center since last visit: Has Dressing in Place as Prescribed: Yes Pain Present Now: No Electronic Signature(s) Signed: 12/19/2019 4:44:30 PM By: Benna Dunks Entered By: Benna Dunks on 12/19/2019 15:55:04 [name], [name] ([id number]) -------------------------------------------------------------------------------- Clinic Level of Care Assessment Details Patient Name: [name], [name] Date of Service: 12/19/2019 3:45 PM Medical Record Number: [id number] Patient Account Number: [id number] Date of Birth/Sex: 1940/07/26 (79 y.o. F) Treating RN: [name] Primary Care Ellyn Rubiano: [name] Other Clinician: Referring Gazelle Towe: [name] Treating Sandra Brents/Extender: [name], [name] Weeks in Treatment: 3 Clinic Level of Care Assessment Items TOOL 4  Quantity Score []  - Use when only an EandM is performed on FOLLOW-UP visit 0 ASSESSMENTS - Nursing Assessment / Reassessment X - Reassessment of Co-morbidities (includes updates in patient status) 1 10 X- 1 5 Reassessment of Adherence to Treatment Plan ASSESSMENTS - Wound and Skin Assessment / Reassessment X - Simple Wound Assessment / Reassessment - one wound 1 5 []  - 0 Complex Wound Assessment / Reassessment - multiple wounds []  - 0 Dermatologic / Skin Assessment (not related to wound area) ASSESSMENTS - Focused Assessment []  - Circumferential Edema Measurements - multi extremities 0 []  - 0 Nutritional Assessment / Counseling / Intervention []  - 0 Lower Extremity Assessment (monofilament, tuning fork, pulses) []  - 0 Peripheral Arterial Disease Assessment (using hand held doppler) ASSESSMENTS - Ostomy and/or Continence Assessment and Care []  - Incontinence Assessment and Management 0 []  - 0 Ostomy Care Assessment and Management (repouching, etc.) PROCESS - Coordination of Care X - Simple Patient / Family Education for ongoing care 1 15 []  - 0 Complex (extensive) Patient / Family Education for ongoing care []  - 0 Staff obtains , Records, Test Results / Process Orders []  - 0 Staff telephones HHA, Nursing Homes / Clarify orders / etc []  - 0 Routine Transfer to another Facility (non-emergent condition) []  - 0 Routine Hospital Admission (non-emergent condition) []  - 0 New Admissions / / Ordering NPWT, Apligraf, etc. []  - 0 Emergency Hospital Admission (emergent condition) X- 1 10 Simple Discharge Coordination []  - 0 Complex (extensive) Discharge Coordination PROCESS - Special Needs []  - Pediatric / Minor Patient Management 0 []  - 0 Isolation Patient Management []  - 0 Hearing / Language / Visual special needs []  - 0 Assessment of Community assistance (transportation, D/C planning, etc.) []  - 0 Additional assistance / Altered  mentation []  - 0 Support Surface(s) Assessment (bed, cushion, seat, etc.) INTERVENTIONS - Wound Cleansing / Measurement Karpf, Errin B. ( ) X- 1 5 Simple Wound Cleansing -  one wound []  - 0 Complex Wound Cleansing - multiple wounds X- 1 5 Wound Imaging (photographs - any number of wounds) []  - 0 Wound Tracing (instead of photographs) X- 1 5 Simple Wound Measurement - one wound []  - 0 Complex Wound Measurement - multiple wounds INTERVENTIONS - Wound Dressings []  - Small Wound Dressing one or multiple wounds 0 []  - 0 Medium Wound Dressing one or multiple wounds X- 1 20 Large Wound Dressing one or multiple wounds []  - 0 Application of Medications - topical []  - 0 Application of Medications - injection INTERVENTIONS - Miscellaneous []  - External ear exam 0 []  - 0 Specimen Collection (cultures, biopsies, blood, body fluids, etc.) []  - 0 Specimen(s) / Culture(s) sent or taken to Lab for analysis []  - 0 Patient Transfer (multiple staff / / Similar devices) []  - 0 Simple Staple / Suture removal (25 or less) []  - 0 Complex Staple / Suture removal (26 or more) []  - 0 Hypo / Hyperglycemic Management (close monitor of Blood Glucose) []  - 0 Ankle / Brachial Index (ABI) - do not check if billed separately X- 1 5 Vital Signs Has the patient been seen at the hospital within the last three years: Yes Total Score: 85 Level Of Care: New/Established - Level 3 Electronic Signature(s) Signed: 12/19/2019 6:25:55 PM By: , [name], RN, [name], [name] RN, [name] Entered By: , [name], RN, [name], [name] on 12/19/2019 16:31:59 Lopez, Evanie B. ( ) -------------------------------------------------------------------------------- Compression Therapy Details Patient Name: [name], [name] Date of Service: 12/19/2019 3:45 PM Medical Record Number: Patient Account Number: Date of Birth/Sex: Sep 11, 1940 (79 y.o. F) Treating RN: Nurse, adult Primary Care Hershell Brandl: Other Clinician: Referring Chiron Campione: Treating Schelly Chuba/Extender: , [name] Weeks in Treatment: 3 Compression Therapy Performed for Wound Assessment: Wound #2 Left,Anterior Lower Leg Performed By: Clinician , RN Compression Type: Three Layer Pre Treatment ABI: 1.2 Post Procedure Diagnosis Same as Pre-procedure Electronic Signature(s) Signed: 12/19/2019 6:25:55 PM By: [name], [name], RN, [name], [name] RN, [name] Entered By: [name], [name], RN, [name], [name] on 12/19/2019 16:29:32 Beaufort, Alynna B. ([id number]) -------------------------------------------------------------------------------- Encounter Discharge Information Details Patient Name: Steel, Daveda B. Date of Service: 12/19/2019 3:45 PM Medical Record Number: [id number] Patient Account Number: [id number] Date of Birth/Sex: 1941-01-29 (79 y.o. F) Treating RN: [name] Primary Care Alainah Phang: [name] Other Clinician: Referring Adonai Selsor: [name] Treating Wilma Michaelson/Extender: [name], [name] Weeks in Treatment: 3 Encounter Discharge Information Items Discharge Condition: Stable Ambulatory Status: Ambulatory Discharge Destination: Home Transportation: Private Auto Accompanied By: friend Schedule Follow-up Appointment: Yes Clinical Summary of Care: Electronic Signature(s) Signed: 12/19/2019 6:25:55 PM By: 12/21/2019, [name], RN, [name], [name] RN, [name] Entered By: [name], [name], RN, [name], [name] on 12/19/2019 16:35:16 Heinrichs, Kymoni B. (12/21/2019) -------------------------------------------------------------------------------- Lower Extremity Assessment Details Patient Name: Moors, Naylee B. Date of Service: 12/19/2019 3:45 PM Medical Record Number: 12/21/2019 Patient Account Number: [id number] Date of Birth/Sex: [birth year] (79 y.o. F) Treating RN: (66 Primary Care Kymani Laursen: [name] Other Clinician: Referring Keyona Emrich: [name] Treating Anapaola Kinsel/Extender: [name], [name] Weeks in Treatment: 3 Edema Assessment Assessed:  [Left: Yes] [Right: No] [Left: Edema] [Right: :] Calf Left: Right: Point of Measurement: 30 cm From Medial Instep 34.5 cm cm Ankle Left: Right: Point of Measurement: 10 cm From Medial Instep 22 cm cm Vascular Assessment Pulses: Dorsalis Pedis Palpable: [Left:Yes] Posterior Tibial Palpable: [Left:Yes] Electronic Signature(s) Signed: 12/19/2019 4:44:30 PM By: 12/21/2019 Signed: 12/19/2019 6:25:55 PM By: [name], [name], RN, [name], [name] RN,  [name] Entered By: Benna Dunks on 12/19/2019 16:12:07 Chew, Jamilette B. ([id number]) -------------------------------------------------------------------------------- Multi Wound Chart Details Patient Name: Masin, Bradleigh B. Date of Service: 12/19/2019 3:45 PM Medical Record Number: [id number] Patient Account Number: [id number] Date of Birth/Sex: [birth year] (79 y.o. F) Treating RN: [name] Primary Care Kamyiah Colantonio: [name] Other Clinician: Referring Danette Weinfeld: [name] Treating Shericka Johnstone/Extender: [name], [name] Weeks in Treatment: 3 Vital Signs Height(in): 66 Pulse(bpm): 69 Weight(lbs): 192 Blood Pressure(mmHg): 141/58 Body Mass Index(BMI): 31 Temperature(F): 97.6 Respiratory Rate(breaths/min): 18 Photos: [N/A:N/A] Wound Location: Left, Anterior Lower Leg N/A N/A Wounding Event: Trauma N/A N/A Primary Etiology: Trauma, Other N/A N/A Comorbid History: Cataracts, Arrhythmia, Congestive N/A N/A Heart Failure, Hypertension, Gout Date Acquired: 10/22/2019 N/A N/A Weeks of Treatment: 3 N/A N/A Wound Status: Open N/A N/A Measurements L x W x D (cm) 2.6x2x0.1 N/A N/A Area (cm) : 4.084 N/A N/A Volume (cm) : 0.408 N/A N/A % Reduction in Area: 42.20% N/A N/A % Reduction in Volume: 42.30% N/A N/A Classification: Full Thickness Without Exposed N/A N/A Support Structures Exudate Amount: Medium N/A N/A Exudate Type: Serosanguineous N/A N/A Exudate Color: red, brown N/A N/A Wound Margin: Flat and Intact N/A N/A Granulation Amount: Medium  (34-66%) N/A N/A Granulation Quality: Red N/A N/A Necrotic Amount: Medium (34-66%) N/A N/A Exposed Structures: Fat Layer (Subcutaneous Tissue) N/A N/A Exposed: Yes Fascia: No Tendon: No Muscle: No Joint: No Bone: No Epithelialization: Small (1-33%) N/A N/A Treatment Notes Electronic Signature(s) Signed: 12/19/2019 6:25:55 PM By: [name], [name], RN, [name], [name] RN, [name] Entered By: [name], [name], RN, [name], [name] on 12/19/2019 16:29:06 Hodgdon, Holly B. ([id number]) -------------------------------------------------------------------------------- Multi-Disciplinary Care Plan Details Patient Name: Fajardo, Vail B. Date of Service: 12/19/2019 3:45 PM Medical Record Number: [id number] Patient Account Number: [id number] Date of Birth/Sex: 1940-07-24 (79 y.o. F) Treating RN: [name] Primary Care Aryaman Haliburton: [name] Other Clinician: Referring Yahye Siebert: [name] Treating Allyse Fregeau/Extender: [name], [name] Weeks in Treatment: 3 Active Inactive Abuse / Safety / Falls / Self Care Management Nursing Diagnoses: History of Falls Goals: Patient will remain injury free related to falls Date Initiated: 11/22/2019 Target Resolution Date: 11/29/2019 Goal Status: Active Interventions: Assess fall risk on admission and as needed Notes: Necrotic Tissue Nursing Diagnoses: Impaired tissue integrity related to necrotic/devitalized tissue Goals: Necrotic/devitalized tissue will be minimized in the wound bed Date Initiated: 11/22/2019 Target Resolution Date: 12/06/2019 Goal Status: Active Interventions: Provide education on necrotic tissue and debridement process Treatment Activities: Excisional debridement : 11/22/2019 Notes: Orientation to the Wound Care Program Nursing Diagnoses: Knowledge deficit related to the wound healing center program Goals: Patient/caregiver will verbalize understanding of the Wound Healing Center Program Date Initiated: 11/22/2019 Target Resolution Date: 11/22/2019 Goal Status:  Active Interventions: Provide education on orientation to the wound center Notes: Pain, Acute or Chronic Nursing Diagnoses: Pain Management - Non-cyclic Acute (Procedural) Goals: Patient will verbalize adequate pain control and receive pain control interventions during procedures as needed Altamirano, Reality B. ([id number]) Date Initiated: 11/22/2019 Target Resolution Date: 11/29/2019 Goal Status: Active Interventions: Complete pain assessment as per visit requirements Treatment Activities: Administer pain control measures as ordered : 11/22/2019 Notes: Wound/Skin Impairment Nursing Diagnoses: Impaired tissue integrity Goals: Ulcer/skin breakdown will have a volume reduction of 30% by week 4 Date Initiated: 11/22/2019 Target Resolution Date: 12/28/2019 Goal Status: Active Interventions: Assess ulceration(s) every visit Treatment Activities: Topical wound management initiated : 11/22/2019 Notes: Electronic Signature(s) Signed: 12/19/2019 6:25:55 PM By: [name], [name], RN, [name], [name] RN, [name] Entered By: [name], [name], RN, [name], [name] on 12/19/2019  16:28:55 Oberman, Lilliane B. ([id number]) -------------------------------------------------------------------------------- Pain Assessment Details Patient Name: Bassin, Alphia B. Date of Service: 12/19/2019 3:45 PM Medical Record Number: [id number] Patient Account Number: [id number] Date of Birth/Sex: 1941/01/24 30(79 y.o. F) Treating RN: Huel CoventryWoody, [name] Primary Care Buna Cuppett: Bethann PunchesMiller, Mark Other Clinician: Referring Lashawn Bromwell: Bethann PunchesMiller, Mark Treating Scotty Pinder/Extender: Linwood DibblesSTONE III, [name] Weeks in Treatment: 3 Active Problems Location of Pain Severity and Description of Pain Patient Has Paino No Site Locations With Dressing Change: No Pain Management and Medication Current Pain Management: Electronic Signature(s) Signed: 12/19/2019 4:44:30 PM By: Benna DunksLineberry, Carol Signed: 12/19/2019 6:25:55 PM By: Elliot GurneyWoody, [name], RN, [name], [name] RN, [name] Entered By: Benna DunksLineberry, Carol on 12/19/2019  15:56:27 Fraley, Gae B. ([id number]) -------------------------------------------------------------------------------- Patient/Caregiver Education Details Patient Name: Warbington, Wylodean B. Date of Service: 12/19/2019 3:45 PM Medical Record Number: [id number] Patient Account Number: [id number] Date of Birth/Gender: 1941/01/24 (79 y.o. F) Treating RN: Huel CoventryWoody, [name] Primary Care Physician: Bethann PunchesMiller, Mark Other Clinician: Referring Physician: Bethann PunchesMiller, Mark Treating Physician/Extender: Linwood DibblesSTONE III, [name] Weeks in Treatment: 3 Education Assessment Education Provided To: Patient Education Topics Provided Wound/Skin Impairment: Handouts: Caring for Your Ulcer Methods: Demonstration, Explain/Verbal Responses: State content correctly Electronic Signature(s) Signed: 12/19/2019 6:25:55 PM By: Elliot GurneyWoody, [name], RN, [name], [name] RN, [name] Entered By: Elliot GurneyWoody, [name], RN, [name], [name] on 12/19/2019 16:32:18 Voorhees, Sherrol B. ([id number]) -------------------------------------------------------------------------------- Wound Assessment Details Patient Name: Krikorian, Anshika B. Date of Service: 12/19/2019 3:45 PM Medical Record Number: [id number] Patient Account Number: [id number] Date of Birth/Sex: 1941/01/24 23(79 y.o. F) Treating RN: Huel CoventryWoody, [name] Primary Care Vicci Reder: Bethann PunchesMiller, Mark Other Clinician: Referring Ayde Record: Bethann PunchesMiller, Mark Treating Carnesha Maravilla/Extender: Linwood DibblesSTONE III, [name] Weeks in Treatment: 3 Wound Status Wound Number: 2 Primary Trauma, Other Etiology: Wound Location: Left, Anterior Lower Leg Wound Status: Open Wounding Event: Trauma Comorbid Cataracts, Arrhythmia, Congestive Heart Failure, Date Acquired: 10/22/2019 History: Hypertension, Gout Weeks Of Treatment: 3 Clustered Wound: No Photos Wound Measurements Length: (cm) 2.6 Width: (cm) 2 Depth: (cm) 0.1 Area: (cm) 4.084 Volume: (cm) 0.408 % Reduction in Area: 42.2% % Reduction in Volume: 42.3% Epithelialization: Small (1-33%) Wound Description Classification: Full Thickness  Without Exposed Support Struct Wound Margin: Flat and Intact Exudate Amount: Medium Exudate Type: Serosanguineous Exudate Color: red, brown ures Foul Odor After Cleansing: No Slough/Fibrino Yes Wound Bed Granulation Amount: Medium (34-66%) Exposed Structure Granulation Quality: Red Fascia Exposed: No Necrotic Amount: Medium (34-66%) Fat Layer (Subcutaneous Tissue) Exposed: Yes Necrotic Quality: Adherent Slough Tendon Exposed: No Muscle Exposed: No Joint Exposed: No Bone Exposed: No Treatment Notes Wound #2 (Left, Anterior Lower Leg) Notes Silver cell, abd, conform Electronic Signature(s) Signed: 12/19/2019 4:44:30 PM By: Grandville SilosLineberry, Carol Renstrom, Kielee B. ([id number]) Signed: 12/19/2019 6:25:55 PM By: Elliot GurneyWoody, [name], RN, [name], [name] RN, [name] Entered By: Benna DunksLineberry, Carol on 12/19/2019 16:15:13 Rosenfield, Aman B. ([id number]) -------------------------------------------------------------------------------- Vitals Details Patient Name: Dedeaux, [place] B. Date of Service: 12/19/2019 3:45 PM Medical Record Number: [id number] Patient Account Number: [id number] Date of Birth/Sex: 1941/01/24 [age] y.o. F) Treating RN: Huel CoventryWoody, [name] Primary Care Tamrah Victorino: Bethann PunchesMiller, Mark Other Clinician: Referring Sanyah Molnar: Bethann PunchesMiller, Mark Treating Carmelo Reidel/Extender: Linwood DibblesSTONE III, [name] Weeks in Treatment: 3 Vital Signs Time Taken: 03:45 Temperature (F): 97.6 Height (in): 66 Pulse (bpm): 69 Weight (lbs): 192 Respiratory Rate (breaths/min): 18 Body Mass Index (BMI): 31 Blood Pressure (mmHg): 141/58 Reference Range: 80 - 120 mg / dl Electronic Signature(s) Signed: 12/19/2019 4:44:30 PM By: Benna DunksLineberry, Carol Entered By: Benna DunksLineberry, Carol on 12/19/2019 15:56:01

## 2019-12-27 ENCOUNTER — Encounter: Payer: Medicare Other | Attending: Physician Assistant | Admitting: Physician Assistant

## 2019-12-27 ENCOUNTER — Other Ambulatory Visit: Payer: Self-pay

## 2019-12-27 DIAGNOSIS — M109 Gout, unspecified: Secondary | ICD-10-CM | POA: Insufficient documentation

## 2019-12-27 DIAGNOSIS — I480 Paroxysmal atrial fibrillation: Secondary | ICD-10-CM | POA: Diagnosis not present

## 2019-12-27 DIAGNOSIS — L03116 Cellulitis of left lower limb: Secondary | ICD-10-CM | POA: Diagnosis not present

## 2019-12-27 DIAGNOSIS — I872 Venous insufficiency (chronic) (peripheral): Secondary | ICD-10-CM | POA: Diagnosis not present

## 2019-12-27 DIAGNOSIS — I110 Hypertensive heart disease with heart failure: Secondary | ICD-10-CM | POA: Diagnosis not present

## 2019-12-27 DIAGNOSIS — I5042 Chronic combined systolic (congestive) and diastolic (congestive) heart failure: Secondary | ICD-10-CM | POA: Insufficient documentation

## 2019-12-27 DIAGNOSIS — L97828 Non-pressure chronic ulcer of other part of left lower leg with other specified severity: Secondary | ICD-10-CM | POA: Diagnosis not present

## 2019-12-27 DIAGNOSIS — Z7901 Long term (current) use of anticoagulants: Secondary | ICD-10-CM | POA: Diagnosis not present

## 2019-12-27 NOTE — Progress Notes (Addendum)
Corwin, Glorine B. ([id number]) Visit Report for 12/27/2019 Chief Complaint Document Details Patient Name: Chacko, Swati B. Date of Service: 12/27/2019 9:30 AM Medical Record Number: [id number] Patient Account Number: [id number] Date of Birth/Sex: Apr 12, 1941 (79 y.o. F) Treating RN: Rodell Perna Primary Care Provider: [name] Other Clinician: Referring Provider: [name] Treating Provider/Extender: [name], Jaxten Brosh Weeks in Treatment: 5 Information Obtained from: Patient Chief Complaint Left LE ulcer Electronic Signature(s) Signed: 12/27/2019 9:58:40 AM By: Lenda Kelp PA-C Entered By: Lenda Kelp on 12/27/2019 09:58:40 Pilot, Sanna B. ([id number]) -------------------------------------------------------------------------------- HPI Details Patient Name: Naples, Alondria B. Date of Service: 12/27/2019 9:30 AM Medical Record Number: [id number] Patient Account Number: [id number] Date of Birth/Sex: [birth year] (79 y.o. F) Treating RN: Rodell Perna Primary Care Provider: [name] Other Clinician: Referring Provider: [name] Treating Provider/Extender: [name], Ashante Snelling Weeks in Treatment: 5 History of Present Illness HPI Description: 06/07/2019 upon evaluation today patient presents today for initial evaluation in our clinic concerning issues she is having with her left lower extremity. She tells me that she hit her leg on a rocking chair when she was getting up and this was roughly at the beginning of December. Dr. Hyacinth Meeker who is her primary care provider has been helping with treating this area since that time. The patient states unfortunately this just is not getting any better. He is attempted salt water soaks as well as using antibiotic ointments topically unfortunately she has not noted much improvement. He wanted her to come to the wound center sooner but she declined although when things did not seem to be getting better she finally consented to this. Currently the patient does appear to  have cellulitis of left lower extremity she also has atrial fibrillation noted for which she is on Eliquis this is probably part of the reason that she developed a large hematoma that she did. Subsequently she also has hypertension as well as congestive heart failure. The patient does not appear to be febrile today and really is doing fairly well as far as that is concerned although the leg is warm to touch pretty much throughout. No fevers, chills, nausea, vomiting, or diarrhea. She has not been on any oral antibiotics. 06/28/2019 patient actually is seen today in follow-up for her lower extremity ulcer. She is status post having actually spent 9 days in the hospital following when I sent her over for evaluation. They did have to keep her in the hospital and treat her with IV vancomycin secondary to the fact that she unfortunately had a bacteria present that obviously can be managed with oral medication with her penicillin allergy. Nonetheless her leg appears to be doing tremendously better today. There is no signs of active infection and overall very pleased with how things are progressing. I do believe that the wound is also measuring better along with looking much less irritated overall I think she is going to do quite well at this point. 07/05/2019 on evaluation today patient actually appears to be doing quite well with regard to her wound which is measuring smaller and still appears to be doing tremendously better compared to prior evaluation. I do believe the Melburn Popper has been of benefit for her. 07/12/19 patient's wound actually appears to be doing excellent at this time in regard to the left posterior lower extremity. She has been tolerating the dressing changes without complication. Fortunately there is much improvement noted here there is some slough noted she has been using Santyl I think that still appropriate. With that  being said I am going to go ahead and perform sharp debridement today to  clear away some of the necrotic material as well. 07/19/2019 upon evaluation today patient actually appears to be doing quite well in regard to her leg ulcer. She has been using Santyl and has been doing excellent with this up to this point. However I think based on what I am seeing today we can likely proceed to doing something else, collagen, to help with getting this wound to heal more effectively and quickly. 07/26/2019 upon evaluation today patient appears to be doing excellent in regard to her lower extremity ulcer. She has been tolerating the dressing changes without complication. Fortunately the wound is continuing to measure smaller and does seem to be progressing quite nicely. I am very pleased with how things are going at this point. The patient likewise is extremely happy. 08/02/2019 upon evaluation today patient appears to be healing quite nicely and is making excellent progress at this point. Fortunately there is no evidence of active infection at this time which is good news. No fever chills noted 08/09/2019 on evaluation today patient's wound actually is continuing to show signs of excellent improvement. Is measuring much smaller this week even compared to last week and overall I am extremely pleased with the progress. Fortunately there is no evidence of active infection at this time. 08/23/2019 upon evaluation today patient appears to be doing well with regard to her original wound she is showing some signs of improvement here which is great news. Fortunately there is no signs of active infection at this time. No fevers, chills, nausea, vomiting, or diarrhea. 08/30/2019 upon evaluation today patient actually appears to be completely healed. She has little bit of dry skin at the location where the wound was but overall she has done extremely well. There is no signs of active infection at this time which is great news. Readmission: 11/22/2019 upon evaluation today patient appears to be doing  somewhat poorly in regard to her leg ulcer. She tells me unfortunately that she fell out of bed about 1 month ago and subsequently has been monitoring this since she initially had a hematoma and it sounds like they were trying to get this to reabsorb Dr. Hyacinth MeekerMiller that is her primary care provider. With that being said her podiatrist apparently tried to open this up according to what the patient tells me today and then subsequently gave her an antibiotic for her toe and said that would also help with the area on her leg. They were not able to get anything to come out as her podiatrist stated that this seemed to be too thick in order to drain. With that being said the patient was eventually referred back here to the wound center for further management. 11/29/2019 on evaluation today patient appears to be doing a little bit more poorly today regarding her wound as compared to her last visit here. Again this is due to blistering on the edges of the wound which is very superficial but nonetheless new since last time. This is likely due to the fact that she did have to take off her compression wrap as she was having significant discomfort due to gout that flared up in her foot. Nonetheless she was seen by her primary care provider where she was given a shot of steroids and has new medication for gout that she is now taking. She still having discomfort however she states she does not believe she is good to be able to  wear the wrap. 12/19/2019 on evaluation today patient actually appears to be making excellent progress in regard to her wound. She has been tolerating the dressing changes without complication. Fortunately there is no signs of active infection at this time. Smits, Jaylenn B. ([id number]) 12/27/2019 on evaluation today patient appears to be doing well with regard to her wound. This is measuring significantly smaller overall feel like the silver cell has done excellent for her. Fortunately there is no signs  of active infection at this time. Electronic Signature(s) Signed: 12/27/2019 10:07:06 AM By: Lenda Kelp PA-C Entered By: Lenda Kelp on 12/27/2019 10:07:05 Behunin, Myrtle B. ([id number]) -------------------------------------------------------------------------------- Physical Exam Details Patient Name: Bobier, Emmalee B. Date of Service: 12/27/2019 9:30 AM Medical Record Number: [id number] Patient Account Number: [id number] Date of Birth/Sex: [birth year] (79 y.o. F) Treating RN: Rodell Perna Primary Care Provider: [name] Other Clinician: Referring Provider: [name] Treating Provider/Extender: [name], Shay Jhaveri Weeks in Treatment: 5 Constitutional Well-nourished and well-hydrated in no acute distress. Respiratory normal breathing without difficulty. Psychiatric this patient is able to make decisions and demonstrates good insight into disease process. Alert and Oriented x 3. pleasant and cooperative. Notes Patient's wound bed actually showed signs of excellent granulation and epithelization overall very pleased with where things stand and I am happy that she is doing much better. Electronic Signature(s) Signed: 12/27/2019 10:07:20 AM By: Lenda Kelp PA-C Entered By: Lenda Kelp on 12/27/2019 10:07:19 Deleo, Jeanann B. ([id number]) -------------------------------------------------------------------------------- Physician Orders Details Patient Name: Bojarski, Arshia B. Date of Service: 12/27/2019 9:30 AM Medical Record Number: [id number] Patient Account Number: [id number] Date of Birth/Sex: 03/08/1941 (79 y.o. F) Treating RN: Rodell Perna Primary Care Provider: [name] Other Clinician: Referring Provider: [name] Treating Provider/Extender: [name], Dejah Droessler Weeks in Treatment: 5 Verbal / Phone Orders: No Diagnosis Coding ICD-10 Coding Code Description S81.802A Unspecified open wound, left lower leg, initial encounter I87.2 Venous insufficiency (chronic)  (peripheral) L97.828 Non-pressure chronic ulcer of other part of left lower leg with other specified severity L03.116 Cellulitis of left lower limb I48.0 Paroxysmal atrial fibrillation I10 Essential (primary) hypertension I50.42 Chronic combined systolic (congestive) and diastolic (congestive) heart failure Z79.01 Long term (current) use of anticoagulants Anesthetic (add to Medication List) Wound #2 Left,Anterior Lower Leg o Topical Lidocaine 4% cream applied to wound bed prior to debridement (In Clinic Only). Primary Wound Dressing Wound #2 Left,Anterior Lower Leg o Silver Alginate Secondary Dressing o ABD and Kerlix/Conform Dressing Change Frequency Wound #2 Left,Anterior Lower Leg o Change Dressing Monday, Wednesday, Friday Follow-up Appointments Wound #2 Left,Anterior Lower Leg o Return Appointment in 1 week. o Nurse Visit as needed Edema Control Wound #2 Left,Anterior Lower Leg o Patient to wear own compression stockings o Compression Pump: Use compression pump on left lower extremity for 60 minutes, twice daily. o Compression Pump: Use compression pump on right lower extremity for 60 minutes, twice daily. Additional Orders / Instructions Wound #2 Left,Anterior Lower Leg o Increase protein intake. Electronic Signature(s) Signed: 12/27/2019 11:33:15 AM By: Rodell Perna Signed: 12/27/2019 5:17:08 PM By: Lenda Kelp PA-C Entered By: Rodell Perna on 12/27/2019 10:04:37 Leatherwood, Deepika B. ([id number]) -------------------------------------------------------------------------------- Problem List Details Patient Name: Bruni, Kariyah B. Date of Service: 12/27/2019 9:30 AM Medical Record Number: [id number] Patient Account Number: [id number] Date of Birth/Sex: Oct 24, 1940 (79 y.o. F) Treating RN: Rodell Perna Primary Care Provider: [name] Other Clinician: Referring Provider: [name] Treating Provider/Extender: [name], Madgie Dhaliwal Weeks in Treatment: 5 Active  Problems ICD-10 Encounter Code Description Active  Date MDM Diagnosis S81.802A Unspecified open wound, left lower leg, initial encounter 11/22/2019 No Yes I87.2 Venous insufficiency (chronic) (peripheral) 12/09/2019 No Yes L97.828 Non-pressure chronic ulcer of other part of left lower leg with other 11/22/2019 No Yes specified severity L03.116 Cellulitis of left lower limb 11/22/2019 No Yes I48.0 Paroxysmal atrial fibrillation 11/22/2019 No Yes I10 Essential (primary) hypertension 11/22/2019 No Yes I50.42 Chronic combined systolic (congestive) and diastolic (congestive) heart 11/22/2019 No Yes failure Z79.01 Long term (current) use of anticoagulants 11/22/2019 No Yes Inactive Problems Resolved Problems Electronic Signature(s) Signed: 12/27/2019 9:58:34 AM By: Lenda Kelp PA-C Entered By: Lenda Kelp on 12/27/2019 09:58:33 Touhey, Jahmiya B. ([id number]) -------------------------------------------------------------------------------- Progress Note Details Patient Name: Pryce, Rushie B. Date of Service: 12/27/2019 9:30 AM Medical Record Number: [id number] Patient Account Number: [id number] Date of Birth/Sex: 07-May-1941 (79 y.o. F) Treating RN: Rodell Perna Primary Care Provider: [name] Other Clinician: Referring Provider: [name] Treating Provider/Extender: [name], Fiore Detjen Weeks in Treatment: 5 Subjective Chief Complaint Information obtained from Patient Left LE ulcer History of Present Illness (HPI) 06/07/2019 upon evaluation today patient presents today for initial evaluation in our clinic concerning issues she is having with her left lower extremity. She tells me that she hit her leg on a rocking chair when she was getting up and this was roughly at the beginning of December. Dr. Hyacinth Meeker who is her primary care provider has been helping with treating this area since that time. The patient states unfortunately this just is not getting any better. He is attempted salt water soaks as well as  using antibiotic ointments topically unfortunately she has not noted much improvement. He wanted her to come to the wound center sooner but she declined although when things did not seem to be getting better she finally consented to this. Currently the patient does appear to have cellulitis of left lower extremity she also has atrial fibrillation noted for which she is on Eliquis this is probably part of the reason that she developed a large hematoma that she did. Subsequently she also has hypertension as well as congestive heart failure. The patient does not appear to be febrile today and really is doing fairly well as far as that is concerned although the leg is warm to touch pretty much throughout. No fevers, chills, nausea, vomiting, or diarrhea. She has not been on any oral antibiotics. 06/28/2019 patient actually is seen today in follow-up for her lower extremity ulcer. She is status post having actually spent 9 days in the hospital following when I sent her over for evaluation. They did have to keep her in the hospital and treat her with IV vancomycin secondary to the fact that she unfortunately had a bacteria present that obviously can be managed with oral medication with her penicillin allergy. Nonetheless her leg appears to be doing tremendously better today. There is no signs of active infection and overall very pleased with how things are progressing. I do believe that the wound is also measuring better along with looking much less irritated overall I think she is going to do quite well at this point. 07/05/2019 on evaluation today patient actually appears to be doing quite well with regard to her wound which is measuring smaller and still appears to be doing tremendously better compared to prior evaluation. I do believe the Melburn Popper has been of benefit for her. 07/12/19 patient's wound actually appears to be doing excellent at this time in regard to the left posterior lower extremity. She has  been tolerating the dressing changes without complication. Fortunately there is much improvement noted here there is some slough noted she has been using Santyl I think that still appropriate. With that being said I am going to go ahead and perform sharp debridement today to clear away some of the necrotic material as well. 07/19/2019 upon evaluation today patient actually appears to be doing quite well in regard to her leg ulcer. She has been using Santyl and has been doing excellent with this up to this point. However I think based on what I am seeing today we can likely proceed to doing something else, collagen, to help with getting this wound to heal more effectively and quickly. 07/26/2019 upon evaluation today patient appears to be doing excellent in regard to her lower extremity ulcer. She has been tolerating the dressing changes without complication. Fortunately the wound is continuing to measure smaller and does seem to be progressing quite nicely. I am very pleased with how things are going at this point. The patient likewise is extremely happy. 08/02/2019 upon evaluation today patient appears to be healing quite nicely and is making excellent progress at this point. Fortunately there is no evidence of active infection at this time which is good news. No fever chills noted 08/09/2019 on evaluation today patient's wound actually is continuing to show signs of excellent improvement. Is measuring much smaller this week even compared to last week and overall I am extremely pleased with the progress. Fortunately there is no evidence of active infection at this time. 08/23/2019 upon evaluation today patient appears to be doing well with regard to her original wound she is showing some signs of improvement here which is great news. Fortunately there is no signs of active infection at this time. No fevers, chills, nausea, vomiting, or diarrhea. 08/30/2019 upon evaluation today patient actually appears to  be completely healed. She has little bit of dry skin at the location where the wound was but overall she has done extremely well. There is no signs of active infection at this time which is great news. Readmission: 11/22/2019 upon evaluation today patient appears to be doing somewhat poorly in regard to her leg ulcer. She tells me unfortunately that she fell out of bed about 1 month ago and subsequently has been monitoring this since she initially had a hematoma and it sounds like they were trying to get this to reabsorb Dr. Hyacinth Meeker that is her primary care provider. With that being said her podiatrist apparently tried to open this up according to what the patient tells me today and then subsequently gave her an antibiotic for her toe and said that would also help with the area on her leg. They were not able to get anything to come out as her podiatrist stated that this seemed to be too thick in order to drain. With that being said the patient was eventually referred back here to the wound center for further management. 11/29/2019 on evaluation today patient appears to be doing a little bit more poorly today regarding her wound as compared to her last visit here. Again this is due to blistering on the edges of the wound which is very superficial but nonetheless new since last time. This is likely due to the fact that she did have to take off her compression wrap as she was having significant discomfort due to gout that flared up in her foot. Nonetheless she was seen by her primary care provider where she was given a shot of  steroids and has new medication for gout that she is now taking. She still having discomfort however she states she does not believe she is good to be able to wear the wrap. Tutor, Amberly B. ([id number]) 12/19/2019 on evaluation today patient actually appears to be making excellent progress in regard to her wound. She has been tolerating the dressing changes without complication.  Fortunately there is no signs of active infection at this time. 12/27/2019 on evaluation today patient appears to be doing well with regard to her wound. This is measuring significantly smaller overall feel like the silver cell has done excellent for her. Fortunately there is no signs of active infection at this time. Objective Constitutional Well-nourished and well-hydrated in no acute distress. Vitals Time Taken: 9:45 AM, Height: 66 in, Weight: 192 lbs, BMI: 31, Temperature: 98.0 F, Pulse: 68 bpm, Respiratory Rate: 16 breaths/min, Blood Pressure: 159/59 mmHg. Respiratory normal breathing without difficulty. Psychiatric this patient is able to make decisions and demonstrates good insight into disease process. Alert and Oriented x 3. pleasant and cooperative. General Notes: Patient's wound bed actually showed signs of excellent granulation and epithelization overall very pleased with where things stand and I am happy that she is doing much better. Integumentary (Hair, Skin) Wound #2 status is Open. Original cause of wound was Trauma. The wound is located on the Left,Anterior Lower Leg. The wound measures 1.2cm length x 1.1cm width x 0.1cm depth; 1.037cm^2 area and 0.104cm^3 volume. There is Fat Layer (Subcutaneous Tissue) Exposed exposed. There is no tunneling or undermining noted. There is a medium amount of serosanguineous drainage noted. The wound margin is flat and intact. There is large (67-100%) red granulation within the wound bed. There is a small (1-33%) amount of necrotic tissue within the wound bed including Adherent Slough. Assessment Active Problems ICD-10 Unspecified open wound, left lower leg, initial encounter Venous insufficiency (chronic) (peripheral) Non-pressure chronic ulcer of other part of left lower leg with other specified severity Cellulitis of left lower limb Paroxysmal atrial fibrillation Essential (primary) hypertension Chronic combined systolic (congestive)  and diastolic (congestive) heart failure Long term (current) use of anticoagulants Plan Anesthetic (add to Medication List): Wound #2 Left,Anterior Lower Leg: Topical Lidocaine 4% cream applied to wound bed prior to debridement (In Clinic Only). Primary Wound Dressing: Wound #2 Left,Anterior Lower Leg: Silver Alginate Secondary Dressing: ABD and Kerlix/Conform Dressing Change Frequency: Wound #2 Left,Anterior Lower Leg: Change Dressing Monday, Wednesday, Friday Dougher, Kenyonna B. ([id number]) Follow-up Appointments: Wound #2 Left,Anterior Lower Leg: Return Appointment in 1 week. Nurse Visit as needed Edema Control: Wound #2 Left,Anterior Lower Leg: Patient to wear own compression stockings Compression Pump: Use compression pump on left lower extremity for 60 minutes, twice daily. Compression Pump: Use compression pump on right lower extremity for 60 minutes, twice daily. Additional Orders / Instructions: Wound #2 Left,Anterior Lower Leg: Increase protein intake. 1. I would recommend that we continue with the silver cell followed by the ABD and roll gauze. Fortunately the patient seems to be doing excellent with this and she is using her compression stocking over top. 2. I am also can recommend she continue to elevate her legs much as possible try to keep edema under good control. We will see patient back for reevaluation in 1 week here in the clinic. If anything worsens or changes patient will contact our office for additional recommendations. Electronic Signature(s) Signed: 12/27/2019 10:07:51 AM By: Lenda KelpStone III, Nyelah Emmerich PA-C Entered By: Lenda KelpStone III, Niquan Charnley on 12/27/2019 10:07:50 Denning, Sharlynn B. ([id number]) --------------------------------------------------------------------------------  SuperBill Details Patient Name: Stender, Shantale B. Date of Service: 12/27/2019 Medical Record Number: [id number] Patient Account Number: [id number] Date of Birth/Sex: 1940-08-16 (79 y.o. 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 Long term (current) use of anticoagulants Facility Procedures CPT4 Code: 95638756 Description: 99213 - WOUND CARE VISIT-LEV 3 EST PT Modifier: Quantity: 1 Physician Procedures CPT4 Code: 4332951 Description: 99213 - WC PHYS LEVEL 3 - EST PT Modifier: Quantity: 1 CPT4 Code: Description: ICD-10 Diagnosis Description S81.802A Unspecified open wound, left lower leg, initial encounter 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

## 2019-12-31 NOTE — Progress Notes (Signed)
Maclaughlin, Klaudia B. ([id number]) Visit Report for 12/27/2019 Arrival Information Details Patient Name: [name], [name] Date of Service: 12/27/2019 9:30 AM Medical Record Number: [id number] Patient Account Number: [id number] Date of Birth/Sex: [birth year] (79 y.o. F) Treating RN: [name] Primary Care Chirstopher Iovino: [name] Other Clinician: Referring Nicoli Nardozzi: [name] Treating Denaly Gatling/Extender: [name], [name] Weeks in Treatment: 5 Visit Information History Since Last Visit Added or deleted any medications: No Patient Arrived: Ambulatory Has Dressing in Place as Prescribed: Yes Arrival Time: 09:44 Pain Present Now: No Accompanied By: self Transfer Assistance: None Patient Identification Verified: Yes Secondary Verification Process Completed: Yes Patient Requires Transmission-Based Precautions: No Patient Has Alerts: No Electronic Signature(s) Signed: 12/31/2019 4:54:20 PM By: [name], [name], RN, [name], [name] RN, [name] Entered By: [name], [name], RN, [name], [name] on 12/27/2019 09:44:58 Lesiak, Mineola B. ([id number]) -------------------------------------------------------------------------------- Clinic Level of Care Assessment Details Patient Name: [name], [name] Date of Service: 12/27/2019 9:30 AM Medical Record Number: [id number] Patient Account Number: [id number] Date of Birth/Sex: 1941-03-31 (79 y.o. F) Treating RN: Rodell Perna Primary Care Wm Fruchter: [name] Other Clinician: Referring Jahki Witham: [name] Treating Purl Claytor/Extender: [name], [name] Weeks in Treatment: 5 Clinic Level of Care Assessment Items TOOL 4 Quantity Score []  - Use when only an EandM is performed on FOLLOW-UP visit 0 ASSESSMENTS - Nursing Assessment / Reassessment X - Reassessment of Co-morbidities (includes updates in patient status) 1 10 X- 1 5 Reassessment of Adherence to Treatment Plan ASSESSMENTS - Wound and Skin Assessment / Reassessment X - Simple Wound Assessment / Reassessment - one wound 1 5 []  - 0 Complex Wound  Assessment / Reassessment - multiple wounds []  - 0 Dermatologic / Skin Assessment (not related to wound area) ASSESSMENTS - Focused Assessment []  - Circumferential Edema Measurements - multi extremities 0 []  - 0 Nutritional Assessment / Counseling / Intervention []  - 0 Lower Extremity Assessment (monofilament, tuning fork, pulses) []  - 0 Peripheral Arterial Disease Assessment (using hand held doppler) ASSESSMENTS - Ostomy and/or Continence Assessment and Care []  - Incontinence Assessment and Management 0 []  - 0 Ostomy Care Assessment and Management (repouching, etc.) PROCESS - Coordination of Care X - Simple Patient / Family Education for ongoing care 1 15 []  - 0 Complex (extensive) Patient / Family Education for ongoing care []  - 0 Staff obtains , Records, Test Results / Process Orders []  - 0 Staff telephones HHA, Nursing Homes / Clarify orders / etc []  - 0 Routine Transfer to another Facility (non-emergent condition) []  - 0 Routine Hospital Admission (non-emergent condition) []  - 0 New Admissions / / Ordering NPWT, Apligraf, etc. []  - 0 Emergency Hospital Admission (emergent condition) X- 1 10 Simple Discharge Coordination []  - 0 Complex (extensive) Discharge Coordination PROCESS - Special Needs []  - Pediatric / Minor Patient Management 0 []  - 0 Isolation Patient Management []  - 0 Hearing / Language / Visual special needs []  - 0 Assessment of Community assistance (transportation, D/C planning, etc.) []  - 0 Additional assistance / Altered mentation []  - 0 Support Surface(s) Assessment (bed, cushion, seat, etc.) INTERVENTIONS - Wound Cleansing / Measurement Henkes, Mirella B. ( ) X- 1 5 Simple Wound Cleansing - one wound []  - 0 Complex Wound Cleansing - multiple wounds X- 1 5 Wound Imaging (photographs - any number of wounds) []  - 0 Wound Tracing (instead of photographs) X- 1 5 Simple Wound Measurement - one wound []  -  0 Complex Wound Measurement - multiple wounds INTERVENTIONS - Wound Dressings []  - Small Wound Dressing one  or multiple wounds 0 X- 1 15 Medium Wound Dressing one or multiple wounds []  - 0 Large Wound Dressing one or multiple wounds []  - 0 Application of Medications - topical []  - 0 Application of Medications - injection INTERVENTIONS - Miscellaneous []  - External ear exam 0 []  - 0 Specimen Collection (cultures, biopsies, blood, body fluids, etc.) []  - 0 Specimen(s) / Culture(s) sent or taken to Lab for analysis []  - 0 Patient Transfer (multiple staff / / Similar devices) []  - 0 Simple Staple / Suture removal (25 or less) []  - 0 Complex Staple / Suture removal (26 or more) []  - 0 Hypo / Hyperglycemic Management (close monitor of Blood Glucose) []  - 0 Ankle / Brachial Index (ABI) - do not check if billed separately X- 1 5 Vital Signs Has the patient been seen at the hospital within the last three years: Yes Total Score: 80 Level Of Care: New/Established - Level 3 Electronic Signature(s) Signed: 12/27/2019 11:33:15 AM By: Entered By: on 12/27/2019 10:04:57 Brault, Amayrany B. ( ) -------------------------------------------------------------------------------- Encounter Discharge Information Details Patient Name: Mcpeek, Doneshia B. Date of Service: 12/27/2019 9:30 AM Medical Record Number: Nurse, adult Patient Account Number: Date of Birth/Sex: [birth year] (79 y.o. F) Treating RN: Primary Care Rayli Wiederhold: 02/26/2020 Other Clinician: Referring Makenzi Bannister: Rodell Perna Treating Stephanne Greeley/Extender: Rodell Perna, [name] Weeks in Treatment: 5 Encounter Discharge Information Items Discharge Condition: Stable Ambulatory Status: Ambulatory Discharge Destination: Home Transportation: Private Auto Accompanied By: family Schedule Follow-up Appointment: Yes Clinical Summary of Care: Electronic Signature(s) Signed: 12/27/2019 11:33:15  AM By: [id number] Entered By: 02/26/2020 on 12/27/2019 10:05:34 Malmquist, Lindsay B. ([id number]) -------------------------------------------------------------------------------- Lower Extremity Assessment Details Patient Name: [name], [name] Date of Service: 12/27/2019 9:30 AM Medical Record Number: [id number] Patient Account Number: Rodell Perna Date of Birth/Sex: 05-26-1940 (79 y.o. F) Treating RN: [name] Primary Care [name]: 02/26/2020 Other Clinician: Referring Atlas Crossland: Rodell Perna Treating Gurpreet Mariani/Extender: Rodell Perna, [name] Weeks in Treatment: 5 Edema Assessment Assessed: [Left: No] [Right: No] [Left: Edema] [Right: :] Calf Left: Right: Point of Measurement: 30 cm From Medial Instep 36 cm cm Ankle Left: Right: Point of Measurement: 10 cm From Medial Instep 22.5 cm cm Vascular Assessment Pulses: Dorsalis Pedis Palpable: [Left:Yes] Electronic Signature(s) Signed: 12/31/2019 4:54:20 PM By: [id number], [name], RN, [name], [name] RN, [name] Entered By: 02/26/2020, [name], RN, [name], [name] on 12/27/2019 09:54:13 Whetsel, Teigen B. ([id number]) -------------------------------------------------------------------------------- Multi Wound Chart Details Patient Name: [name], [name] Date of Service: 12/27/2019 9:30 AM Medical Record Number: [id number] Patient Account Number: [name] Date of Birth/Sex: 07/09/1940 (79 y.o. F) Treating RN: [name] Primary Care Ivonne Freeburg: 03/01/2020 Other Clinician: Referring Lonney Revak: [name] Treating Ace Bergfeld/Extender: [name], [name] Weeks in Treatment: 5 Vital Signs Height(in): 66 Pulse(bpm): 68 Weight(lbs): 192 Blood Pressure(mmHg): 159/59 Body Mass Index(BMI): 31 Temperature(F): 98.0 Respiratory Rate(breaths/min): 16 Photos: [N/A:N/A] Wound Location: Left, Anterior Lower Leg N/A N/A Wounding Event: Trauma N/A N/A Primary Etiology: Trauma, Other N/A N/A Comorbid History: Cataracts, Arrhythmia, Congestive N/A N/A Heart Failure, Hypertension, Gout Date  Acquired: 10/22/2019 N/A N/A Weeks of Treatment: 5 N/A N/A Wound Status: Open N/A N/A Measurements L x W x D (cm) 1.2x1.1x0.1 N/A N/A Area (cm) : 1.037 N/A N/A Volume (cm) : 0.104 N/A N/A % Reduction in Area: 85.30% N/A N/A % Reduction in Volume: 85.30% N/A N/A Classification: Full Thickness Without Exposed N/A N/A Support Structures Exudate Amount: Medium N/A N/A Exudate Type: Serosanguineous N/A N/A Exudate Color: red, brown N/A  N/A Wound Margin: Flat and Intact N/A N/A Granulation Amount: Large (67-100%) N/A N/A Granulation Quality: Red N/A N/A Necrotic Amount: Small (1-33%) N/A N/A Exposed Structures: Fat Layer (Subcutaneous Tissue) N/A N/A Exposed: Yes Fascia: No Tendon: No Muscle: No Joint: No Bone: No Epithelialization: Medium (34-66%) N/A N/A Treatment Notes Electronic Signature(s) Signed: 12/27/2019 11:33:15 AM By: Rodell PernaScott, Dajea Entered By: Rodell PernaScott, Dajea on 12/27/2019 10:02:27 Jastrzebski, Hilliary B. ([id number]) -------------------------------------------------------------------------------- Multi-Disciplinary Care Plan Details Patient Name: [name], [name] Date of Service: 12/27/2019 9:30 AM Medical Record Number: [id number] Patient Account Number: [id number] Date of Birth/Sex: 1941/05/20 (79 y.o. F) Treating RN: Rodell PernaScott, Dajea Primary Care Theseus Birnie: Bethann PunchesMiller, Mark Other Clinician: Referring Osbaldo Mark: Bethann PunchesMiller, Mark Treating Kashira Behunin/Extender: Linwood DibblesSTONE III, [name] Weeks in Treatment: 5 Active Inactive Abuse / Safety / Falls / Self Care Management Nursing Diagnoses: History of Falls Goals: Patient will remain injury free related to falls Date Initiated: 11/22/2019 Target Resolution Date: 11/29/2019 Goal Status: Active Interventions: Assess fall risk on admission and as needed Notes: Necrotic Tissue Nursing Diagnoses: Impaired tissue integrity related to necrotic/devitalized tissue Goals: Necrotic/devitalized tissue will be minimized in the wound bed Date Initiated: 11/22/2019 Target  Resolution Date: 12/06/2019 Goal Status: Active Interventions: Provide education on necrotic tissue and debridement process Treatment Activities: Excisional debridement : 11/22/2019 Notes: Orientation to the Wound Care Program Nursing Diagnoses: Knowledge deficit related to the wound healing center program Goals: Patient/caregiver will verbalize understanding of the Wound Healing Center Program Date Initiated: 11/22/2019 Target Resolution Date: 11/22/2019 Goal Status: Active Interventions: Provide education on orientation to the wound center Notes: Pain, Acute or Chronic Nursing Diagnoses: Pain Management - Non-cyclic Acute (Procedural) Goals: Patient will verbalize adequate pain control and receive pain control interventions during procedures as needed Gloss, Kadeisha B. ([id number]) Date Initiated: 11/22/2019 Target Resolution Date: 11/29/2019 Goal Status: Active Interventions: Complete pain assessment as per visit requirements Treatment Activities: Administer pain control measures as ordered : 11/22/2019 Notes: Wound/Skin Impairment Nursing Diagnoses: Impaired tissue integrity Goals: Ulcer/skin breakdown will have a volume reduction of 30% by week 4 Date Initiated: 11/22/2019 Target Resolution Date: 12/28/2019 Goal Status: Active Interventions: Assess ulceration(s) every visit Treatment Activities: Topical wound management initiated : 11/22/2019 Notes: Electronic Signature(s) Signed: 12/27/2019 11:33:15 AM By: Rodell PernaScott, Dajea Entered By: Rodell PernaScott, Dajea on 12/27/2019 10:02:14 Parlett, Reshonda B. ([id number]) -------------------------------------------------------------------------------- Pain Assessment Details Patient Name: [name], [name] Date of Service: 12/27/2019 9:30 AM Medical Record Number: [id number] Patient Account Number: [id number] Date of Birth/Sex: 1941/05/20 (79 y.o. F) Treating RN: Huel CoventryWoody, [name] Primary Care Denisia Harpole: Bethann PunchesMiller, Mark Other Clinician: Referring Johnna Bollier: Bethann PunchesMiller, Mark Treating  Vinicio Lynk/Extender: Linwood DibblesSTONE III, [name] Weeks in Treatment: 5 Active Problems Location of Pain Severity and Description of Pain Patient Has Paino No Site Locations Pain Management and Medication Current Pain Management: Electronic Signature(s) Signed: 12/31/2019 4:54:20 PM By: Elliot GurneyWoody, [name], RN, [name], [name] RN, [name] Entered By: Elliot GurneyWoody, [name], RN, [name], [name] on 12/27/2019 09:50:18 Seabolt, Geryl B. ([id number]) -------------------------------------------------------------------------------- Patient/Caregiver Education Details Patient Name: [name], [name] Date of Service: 12/27/2019 9:30 AM Medical Record Number: [id number] Patient Account Number: [id number] Date of Birth/Gender: 1941/05/20 (79 y.o. F) Treating RN: Rodell PernaScott, Dajea Primary Care Physician: Bethann PunchesMiller, Mark Other Clinician: Referring Physician: Bethann PunchesMiller, Mark Treating Physician/Extender: Linwood DibblesSTONE III, [name] Weeks in Treatment: 5 Education Assessment Education Provided To: Patient Education Topics Provided Wound/Skin Impairment: Handouts: Caring for Your Ulcer Methods: Demonstration, Explain/Verbal Responses: State content correctly Electronic Signature(s) Signed: 12/27/2019 11:33:15 AM By: Rodell PernaScott, Dajea Entered By: Rodell PernaScott, Dajea on 12/27/2019 10:05:08 Wimbish, Jnya B. ([id number]) -------------------------------------------------------------------------------- Wound Assessment Details  Patient Name: [name], [name] Date of Service: 12/27/2019 9:30 AM Medical Record Number: [id number] Patient Account Number: [id number] Date of Birth/Sex: 06-17-1940 (79 y.o. F) Treating RN: [name] Primary Care Lismary Kiehn: [name] Other Clinician: Referring Tamarra Geiselman: [name] Treating Sadonna [name]/Extender: [name], [name] Weeks in Treatment: 5 Wound Status Wound Number: 2 Primary Trauma, Other Etiology: Wound Location: Left, Anterior Lower Leg Wound Status: Open Wounding Event: Trauma Comorbid Cataracts, Arrhythmia, Congestive Heart Failure, Date Acquired:  10/22/2019 History: Hypertension, Gout Weeks Of Treatment: 5 Clustered Wound: No Photos Wound Measurements Length: (cm) 1.2 Width: (cm) 1.1 Depth: (cm) 0.1 Area: (cm) 1.037 Volume: (cm) 0.104 % Reduction in Area: 85.3% % Reduction in Volume: 85.3% Epithelialization: Medium (34-66%) Tunneling: No Undermining: No Wound Description Classification: Full Thickness Without Exposed Support Struct Wound Margin: Flat and Intact Exudate Amount: Medium Exudate Type: Serosanguineous Exudate Color: red, brown ures Foul Odor After Cleansing: No Slough/Fibrino Yes Wound Bed Granulation Amount: Large (67-100%) Exposed Structure Granulation Quality: Red Fascia Exposed: No Necrotic Amount: Small (1-33%) Fat Layer (Subcutaneous Tissue) Exposed: Yes Necrotic Quality: Adherent Slough Tendon Exposed: No Muscle Exposed: No Joint Exposed: No Bone Exposed: No Treatment Notes Wound #2 (Left, Anterior Lower Leg) Notes Silver cell, abd, conform Electronic Signature(s) Signed: 12/31/2019 4:54:20 PM By: [name], [name], RN, [name], [name] RN, [name] Heisler, Leasia B. ([id number]) Entered By: [name], [name], RN, [name], [name] on 12/27/2019 09:51:46 Torrence, Aarushi B. ([id number]) -------------------------------------------------------------------------------- Vitals Details Patient Name: [name], [name] Date of Service: 12/27/2019 9:30 AM Medical Record Number: [id number] Patient Account Number: [id number] Date of Birth/Sex: December 06, 1940 (79 y.o. F) Treating RN: [name] Primary Care Wilene Pharo: [name] Other Clinician: Referring Leiani Enright: [name] Treating Jenisis Harmsen/Extender: [name], [name] Weeks in Treatment: 5 Vital Signs Time Taken: 09:45 Temperature (F): 98.0 Height (in): 66 Pulse (bpm): 68 Weight (lbs): 192 Respiratory Rate (breaths/min): 16 Body Mass Index (BMI): 31 Blood Pressure (mmHg): 159/59 Reference Range: 80 - 120 mg / dl Electronic Signature(s) Signed: 12/31/2019 4:54:20 PM By: [name], [name], RN, [name],  [name] RN, [name] Entered By: [name], [name], RN, [name], [name] on 12/27/2019 09:50:02

## 2020-01-03 ENCOUNTER — Other Ambulatory Visit: Payer: Self-pay

## 2020-01-03 ENCOUNTER — Encounter: Payer: Medicare Other | Admitting: Physician Assistant

## 2020-01-03 DIAGNOSIS — L97828 Non-pressure chronic ulcer of other part of left lower leg with other specified severity: Secondary | ICD-10-CM | POA: Diagnosis not present

## 2020-01-03 NOTE — Progress Notes (Addendum)
Sonneborn, Reem B. ([id number]) Visit Report for 01/03/2020 Chief Complaint Document Details Patient Name: [name], [name] Date of Service: 01/03/2020 9:00 AM Medical Record Number: [id number] Patient Account Number: [id number] Date of Birth/Sex: 1940/07/28 (79 y.o. F) Treating RN: [name] Primary Care Provider: [name] Other Clinician: Referring Provider: [name] Treating Provider/Extender: [name], Therese Rocco Weeks in Treatment: 6 Information Obtained from: Patient Chief Complaint Left LE ulcer Electronic Signature(s) Signed: 01/03/2020 9:23:52 AM By: Lenda Kelp PA-C Entered By: Lenda Kelp on 01/03/2020 09:23:51 [name], [name] ([id number]) -------------------------------------------------------------------------------- HPI Details Patient Name: [name], [name] Date of Service: 01/03/2020 9:00 AM Medical Record Number: [id number] Patient Account Number: [id number] Date of Birth/Sex: 06-Nov-1940 (79 y.o. F) Treating RN: [name] Primary Care Provider: [name] Other Clinician: Referring Provider: [name] Treating Provider/Extender: [name], Rawn Quiroa Weeks in Treatment: 6 History of Present Illness HPI Description: 06/07/2019 upon evaluation today patient presents today for initial evaluation in our clinic concerning issues she is having with her left lower extremity. She tells me that she hit her leg on a rocking chair when she was getting up and this was roughly at the beginning of December. Dr. Hyacinth Meeker who is her primary care provider has been helping with treating this area since that time. The patient states unfortunately this just is not getting any better. He is attempted salt water soaks as well as using antibiotic ointments topically unfortunately she has not noted much improvement. He wanted her to come to the wound center sooner but she declined although when things did not seem to be getting better she finally consented to this. Currently the patient does appear to  have cellulitis of left lower extremity she also has atrial fibrillation noted for which she is on Eliquis this is probably part of the reason that she developed a large hematoma that she did. Subsequently she also has hypertension as well as congestive heart failure. The patient does not appear to be febrile today and really is doing fairly well as far as that is concerned although the leg is warm to touch pretty much throughout. No fevers, chills, nausea, vomiting, or diarrhea. She has not been on any oral antibiotics. 06/28/2019 patient actually is seen today in follow-up for her lower extremity ulcer. She is status post having actually spent 9 days in the hospital following when I sent her over for evaluation. They did have to keep her in the hospital and treat her with IV vancomycin secondary to the fact that she unfortunately had a bacteria present that obviously can be managed with oral medication with her penicillin allergy. Nonetheless her leg appears to be doing tremendously better today. There is no signs of active infection and overall very pleased with how things are progressing. I do believe that the wound is also measuring better along with looking much less irritated overall I think she is going to do quite well at this point. 07/05/2019 on evaluation today patient actually appears to be doing quite well with regard to her wound which is measuring smaller and still appears to be doing tremendously better compared to prior evaluation. I do believe the Melburn Popper has been of benefit for her. 07/12/19 patient's wound actually appears to be doing excellent at this time in regard to the left posterior lower extremity. She has been tolerating the dressing changes without complication. Fortunately there is much improvement noted here there is some slough noted she has been using Santyl I think that still appropriate. With that  being said I am going to go ahead and perform sharp debridement today to  clear away some of the necrotic material as well. 07/19/2019 upon evaluation today patient actually appears to be doing quite well in regard to her leg ulcer. She has been using Santyl and has been doing excellent with this up to this point. However I think based on what I am seeing today we can likely proceed to doing something else, collagen, to help with getting this wound to heal more effectively and quickly. 07/26/2019 upon evaluation today patient appears to be doing excellent in regard to her lower extremity ulcer. She has been tolerating the dressing changes without complication. Fortunately the wound is continuing to measure smaller and does seem to be progressing quite nicely. I am very pleased with how things are going at this point. The patient likewise is extremely happy. 08/02/2019 upon evaluation today patient appears to be healing quite nicely and is making excellent progress at this point. Fortunately there is no evidence of active infection at this time which is good news. No fever chills noted 08/09/2019 on evaluation today patient's wound actually is continuing to show signs of excellent improvement. Is measuring much smaller this week even compared to last week and overall I am extremely pleased with the progress. Fortunately there is no evidence of active infection at this time. 08/23/2019 upon evaluation today patient appears to be doing well with regard to her original wound she is showing some signs of improvement here which is great news. Fortunately there is no signs of active infection at this time. No fevers, chills, nausea, vomiting, or diarrhea. 08/30/2019 upon evaluation today patient actually appears to be completely healed. She has little bit of dry skin at the location where the wound was but overall she has done extremely well. There is no signs of active infection at this time which is great news. Readmission: 11/22/2019 upon evaluation today patient appears to be doing  somewhat poorly in regard to her leg ulcer. She tells me unfortunately that she fell out of bed about 1 month ago and subsequently has been monitoring this since she initially had a hematoma and it sounds like they were trying to get this to reabsorb Dr. Hyacinth MeekerMiller that is her primary care provider. With that being said her podiatrist apparently tried to open this up according to what the patient tells me today and then subsequently gave her an antibiotic for her toe and said that would also help with the area on her leg. They were not able to get anything to come out as her podiatrist stated that this seemed to be too thick in order to drain. With that being said the patient was eventually referred back here to the wound center for further management. 11/29/2019 on evaluation today patient appears to be doing a little bit more poorly today regarding her wound as compared to her last visit here. Again this is due to blistering on the edges of the wound which is very superficial but nonetheless new since last time. This is likely due to the fact that she did have to take off her compression wrap as she was having significant discomfort due to gout that flared up in her foot. Nonetheless she was seen by her primary care provider where she was given a shot of steroids and has new medication for gout that she is now taking. She still having discomfort however she states she does not believe she is good to be able to  wear the wrap. 12/19/2019 on evaluation today patient actually appears to be making excellent progress in regard to her wound. She has been tolerating the dressing changes without complication. Fortunately there is no signs of active infection at this time. 12/27/2019 on evaluation today patient appears to be doing well with regard to her wound. This is measuring significantly smaller overall feel like Persing, Jaculin B. ([id number]) the silver cell has done excellent for her. Fortunately there is no signs  of active infection at this time. 01/03/2020 upon evaluation today patient's wound actually is very close to complete resolution. She in fact is measuring significantly smaller and there does not appear to be any evidence of significant drainage really I feel like this will be healed by next week. Electronic Signature(s) Signed: 01/03/2020 9:36:06 AM By: Lenda Kelp PA-C Entered By: Lenda Kelp on 01/03/2020 09:36:06 [name], [name] ([id number]) -------------------------------------------------------------------------------- Physical Exam Details Patient Name: Postlewait, Marysa B. Date of Service: 01/03/2020 9:00 AM Medical Record Number: [id number] Patient Account Number: [id number] Date of Birth/Sex: 1940-06-02 (79 y.o. F) Treating RN: [name] Primary Care Provider: [name] Other Clinician: Referring Provider: [name] Treating Provider/Extender: [name], Zella Dewan Weeks in Treatment: 6 Constitutional Well-nourished and well-hydrated in no acute distress. Respiratory normal breathing without difficulty. Psychiatric this patient is able to make decisions and demonstrates good insight into disease process. Alert and Oriented x 3. pleasant and cooperative. Notes Upon inspection patient's wound bed actually showed signs of good granulation at this time. There is also great epithelial growth as well and overall the patient seems to be doing quite well which is great news. Electronic Signature(s) Signed: 01/03/2020 9:37:22 AM By: Lenda Kelp PA-C Entered By: Lenda Kelp on 01/03/2020 09:37:22 [name], [name] ([id number]) -------------------------------------------------------------------------------- Physician Orders Details Patient Name: [name], [name] Date of Service: 01/03/2020 9:00 AM Medical Record Number: [id number] Patient Account Number: [id number] Date of Birth/Sex: [birth year] (79 y.o. F) Treating RN: [name] Primary Care Provider: [name] Other  Clinician: Referring Provider: [name] Treating Provider/Extender: [name], Jacquese Hackman Weeks in Treatment: 6 Verbal / Phone Orders: No Diagnosis Coding ICD-10 Coding Code Description [phone] Unspecified open wound, left lower leg, initial encounter I87.2 Venous insufficiency (chronic) (peripheral) L97.828 Non-pressure chronic ulcer of other part of left lower leg with other specified severity L03.116 Cellulitis of left lower limb I48.0 Paroxysmal atrial fibrillation I10 Essential (primary) hypertension I50.42 Chronic combined systolic (congestive) and diastolic (congestive) heart failure Z79.01 Long term (current) use of anticoagulants Anesthetic (add to Medication List) Wound #2 Left,Anterior Lower Leg o Topical Lidocaine 4% cream applied to wound bed prior to debridement (In Clinic Only). Primary Wound Dressing Wound #2 Left,Anterior Lower Leg o Silver Alginate Secondary Dressing o Boardered Foam Dressing Dressing Change Frequency Wound #2 Left,Anterior Lower Leg o Change Dressing Monday, Wednesday, Friday Follow-up Appointments Wound #2 Left,Anterior Lower Leg o Return Appointment in 1 week. o Nurse Visit as needed Edema Control Wound #2 Left,Anterior Lower Leg o Patient to wear own compression stockings o Compression Pump: Use compression pump on left lower extremity for 60 minutes, twice daily. o Compression Pump: Use compression pump on right lower extremity for 60 minutes, twice daily. Additional Orders / Instructions Wound #2 Left,Anterior Lower Leg o Increase protein intake. Electronic Signature(s) Signed: 01/03/2020 5:00:44 PM By: Lenda Kelp PA-C Signed: 01/03/2020 6:14:49 PM By: [name], [name], RN, [name], [name] RN, [name] Entered By: [name], [name], RN, [name], [name] on 01/03/2020 09:30:58 [name], [name] ([id number]) -------------------------------------------------------------------------------- Problem List  Details Patient Name: Croak, Tyjai B. Date of  Service: 01/03/2020 9:00 AM Medical Record Number: [id number] Patient Account Number: [id number] Date of Birth/Sex: August 08, 1940 (79 y.o. F) Treating RN: [name] Primary Care Provider: [name] Other Clinician: Referring Provider: [name] Treating Provider/Extender: [name], Alline Pio Weeks in Treatment: 6 Active Problems ICD-10 Encounter Code Description Active Date MDM Diagnosis S81.802A Unspecified open wound, left lower leg, initial encounter 11/22/2019 No Yes I87.2 Venous insufficiency (chronic) (peripheral) 12/09/2019 No Yes L97.828 Non-pressure chronic ulcer of other part of left lower leg with other 11/22/2019 No Yes specified severity L03.116 Cellulitis of left lower limb 11/22/2019 No Yes I48.0 Paroxysmal atrial fibrillation 11/22/2019 No Yes I10 Essential (primary) hypertension 11/22/2019 No Yes I50.42 Chronic combined systolic (congestive) and diastolic (congestive) heart 11/22/2019 No Yes failure Z79.01 Long term (current) use of anticoagulants 11/22/2019 No Yes Inactive Problems Resolved Problems Electronic Signature(s) Signed: 01/03/2020 9:23:45 AM By: Lenda Kelp PA-C Entered By: Lenda Kelp on 01/03/2020 09:23:45 [name], [name] ([id number]) -------------------------------------------------------------------------------- Progress Note Details Patient Name: [name], [name] Date of Service: 01/03/2020 9:00 AM Medical Record Number: [id number] Patient Account Number: [id number] Date of Birth/Sex: Sep 10, 1940 (79 y.o. F) Treating RN: [name] Primary Care Provider: [name] Other Clinician: Referring Provider: [name] Treating Provider/Extender: [name], Kyrstal Monterrosa Weeks in Treatment: 6 Subjective Chief Complaint Information obtained from Patient Left LE ulcer History of Present Illness (HPI) 06/07/2019 upon evaluation today patient presents today for initial evaluation in our clinic concerning issues she is having with her left lower extremity. She tells me that  she hit her leg on a rocking chair when she was getting up and this was roughly at the beginning of December. Dr. Hyacinth Meeker who is her primary care provider has been helping with treating this area since that time. The patient states unfortunately this just is not getting any better. He is attempted salt water soaks as well as using antibiotic ointments topically unfortunately she has not noted much improvement. He wanted her to come to the wound center sooner but she declined although when things did not seem to be getting better she finally consented to this. Currently the patient does appear to have cellulitis of left lower extremity she also has atrial fibrillation noted for which she is on Eliquis this is probably part of the reason that she developed a large hematoma that she did. Subsequently she also has hypertension as well as congestive heart failure. The patient does not appear to be febrile today and really is doing fairly well as far as that is concerned although the leg is warm to touch pretty much throughout. No fevers, chills, nausea, vomiting, or diarrhea. She has not been on any oral antibiotics. 06/28/2019 patient actually is seen today in follow-up for her lower extremity ulcer. She is status post having actually spent 9 days in the hospital following when I sent her over for evaluation. They did have to keep her in the hospital and treat her with IV vancomycin secondary to the fact that she unfortunately had a bacteria present that obviously can be managed with oral medication with her penicillin allergy. Nonetheless her leg appears to be doing tremendously better today. There is no signs of active infection and overall very pleased with how things are progressing. I do believe that the wound is also measuring better along with looking much less irritated overall I think she is going to do quite well at this point. 07/05/2019 on evaluation today patient actually appears to  be doing quite  well with regard to her wound which is measuring smaller and still appears to be doing tremendously better compared to prior evaluation. I do believe the Melburn Popper has been of benefit for her. 07/12/19 patient's wound actually appears to be doing excellent at this time in regard to the left posterior lower extremity. She has been tolerating the dressing changes without complication. Fortunately there is much improvement noted here there is some slough noted she has been using Santyl I think that still appropriate. With that being said I am going to go ahead and perform sharp debridement today to clear away some of the necrotic material as well. 07/19/2019 upon evaluation today patient actually appears to be doing quite well in regard to her leg ulcer. She has been using Santyl and has been doing excellent with this up to this point. However I think based on what I am seeing today we can likely proceed to doing something else, collagen, to help with getting this wound to heal more effectively and quickly. 07/26/2019 upon evaluation today patient appears to be doing excellent in regard to her lower extremity ulcer. She has been tolerating the dressing changes without complication. Fortunately the wound is continuing to measure smaller and does seem to be progressing quite nicely. I am very pleased with how things are going at this point. The patient likewise is extremely happy. 08/02/2019 upon evaluation today patient appears to be healing quite nicely and is making excellent progress at this point. Fortunately there is no evidence of active infection at this time which is good news. No fever chills noted 08/09/2019 on evaluation today patient's wound actually is continuing to show signs of excellent improvement. Is measuring much smaller this week even compared to last week and overall I am extremely pleased with the progress. Fortunately there is no evidence of active infection at this time. 08/23/2019 upon  evaluation today patient appears to be doing well with regard to her original wound she is showing some signs of improvement here which is great news. Fortunately there is no signs of active infection at this time. No fevers, chills, nausea, vomiting, or diarrhea. 08/30/2019 upon evaluation today patient actually appears to be completely healed. She has little bit of dry skin at the location where the wound was but overall she has done extremely well. There is no signs of active infection at this time which is great news. Readmission: 11/22/2019 upon evaluation today patient appears to be doing somewhat poorly in regard to her leg ulcer. She tells me unfortunately that she fell out of bed about 1 month ago and subsequently has been monitoring this since she initially had a hematoma and it sounds like they were trying to get this to reabsorb Dr. Hyacinth Meeker that is her primary care provider. With that being said her podiatrist apparently tried to open this up according to what the patient tells me today and then subsequently gave her an antibiotic for her toe and said that would also help with the area on her leg. They were not able to get anything to come out as her podiatrist stated that this seemed to be too thick in order to drain. With that being said the patient was eventually referred back here to the wound center for further management. 11/29/2019 on evaluation today patient appears to be doing a little bit more poorly today regarding her wound as compared to her last visit here. Again this is due to blistering on the edges of the  wound which is very superficial but nonetheless new since last time. This is likely due to the fact that she did have to take off her compression wrap as she was having significant discomfort due to gout that flared up in her foot. Nonetheless she was seen by her primary care provider where she was given a shot of steroids and has new medication for gout that she is now taking.  She still having discomfort however she states she does not believe she is good to be able to wear the wrap. [name], [name] ([id number]) 12/19/2019 on evaluation today patient actually appears to be making excellent progress in regard to her wound. She has been tolerating the dressing changes without complication. Fortunately there is no signs of active infection at this time. 12/27/2019 on evaluation today patient appears to be doing well with regard to her wound. This is measuring significantly smaller overall feel like the silver cell has done excellent for her. Fortunately there is no signs of active infection at this time. 01/03/2020 upon evaluation today patient's wound actually is very close to complete resolution. She in fact is measuring significantly smaller and there does not appear to be any evidence of significant drainage really I feel like this will be healed by next week. Objective Constitutional Well-nourished and well-hydrated in no acute distress. Vitals Time Taken: 9:10 AM, Height: 66 in, Weight: 192 lbs, BMI: 31, Temperature: 97.7 F, Pulse: 67 bpm, Respiratory Rate: 18 breaths/min, Blood Pressure: 123/61 mmHg. Respiratory normal breathing without difficulty. Psychiatric this patient is able to make decisions and demonstrates good insight into disease process. Alert and Oriented x 3. pleasant and cooperative. General Notes: Upon inspection patient's wound bed actually showed signs of good granulation at this time. There is also great epithelial growth as well and overall the patient seems to be doing quite well which is great news. Integumentary (Hair, Skin) Wound #2 status is Open. Original cause of wound was Trauma. The wound is located on the Left,Anterior Lower Leg. The wound measures 0.3cm length x 0.5cm width x 0.1cm depth; 0.118cm^2 area and 0.012cm^3 volume. There is Fat Layer (Subcutaneous Tissue) Exposed exposed. There is a medium amount of serosanguineous drainage  noted. The wound margin is flat and intact. There is large (67-100%) red granulation within the wound bed. There is a small (1-33%) amount of necrotic tissue within the wound bed including Adherent Slough. Assessment Active Problems ICD-10 Unspecified open wound, left lower leg, initial encounter Venous insufficiency (chronic) (peripheral) Non-pressure chronic ulcer of other part of left lower leg with other specified severity Cellulitis of left lower limb Paroxysmal atrial fibrillation Essential (primary) hypertension Chronic combined systolic (congestive) and diastolic (congestive) heart failure Long term (current) use of anticoagulants Plan Anesthetic (add to Medication List): Wound #2 Left,Anterior Lower Leg: Topical Lidocaine 4% cream applied to wound bed prior to debridement (In Clinic Only). Primary Wound Dressing: Wound #2 Left,Anterior Lower Leg: Silver Alginate Secondary Dressing: Boardered Foam Dressing Dressing Change Frequency: [name], [name] ([id number]) Wound #2 Left,Anterior Lower Leg: Change Dressing Monday, Wednesday, Friday Follow-up Appointments: Wound #2 Left,Anterior Lower Leg: Return Appointment in 1 week. Nurse Visit as needed Edema Control: Wound #2 Left,Anterior Lower Leg: Patient to wear own compression stockings Compression Pump: Use compression pump on left lower extremity for 60 minutes, twice daily. Compression Pump: Use compression pump on right lower extremity for 60 minutes, twice daily. Additional Orders / Instructions: Wound #2 Left,Anterior Lower Leg: Increase protein intake. 1. I would recommend that we go  ahead and continue with the silver alginate dressing as that seems to be doing excellent for her. 2. I am also can recommend that we going to continue with the bordered foam dressing to cover as it also seems to be beneficial for the patient. 3. I would also recommend that we have the patient continue to elevate her legs to try to keep  edema under good control. We will see patient back for reevaluation in 1 week here in the clinic. If anything worsens or changes patient will contact our office for additional recommendations. I expect she will likely be healed by next week. Electronic Signature(s) Signed: 01/03/2020 9:37:54 AM By: Lenda Kelp PA-C Entered By: Lenda Kelp on 01/03/2020 09:37:53 [name], [name] ([id number]) -------------------------------------------------------------------------------- SuperBill Details Patient Name: [name], [name] Date of Service: 01/03/2020 Medical Record Number: [id number] Patient Account Number: [id number] Date of Birth/Sex: [birth year] (79 y.o. 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 Long term (current) use of anticoagulants Facility Procedures CPT4 Code: [id number] Description: 99213 - WOUND CARE VISIT-LEV 3 EST PT Modifier: Quantity: 1 Physician Procedures CPT4 Code: [id number] Description: 99213 - WC PHYS LEVEL 3 - EST PT Modifier: Quantity: 1 CPT4 Code: Description: ICD-10 Diagnosis Description S81.802A Unspecified open wound, left lower leg, initial encounter I87.2 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

## 2020-01-07 NOTE — Progress Notes (Signed)
[name], [name] ([id number]) Visit Report for 01/03/2020 Arrival Information Details Patient Name: Dupuy, [name] Date of Service: 01/03/2020 9:00 AM Medical Record Number: [id number] Patient Account Number: [id number] Date of Birth/Sex: 04/09/1941 (79 y.o. F) Treating RN: [name] Primary Care Tien Aispuro: [name] Other Clinician: Referring Yosiah Jasmin: [name] Treating Emmanuela Ghazi/Extender: [name], [name] Weeks in Treatment: 6 Visit Information History Since Last Visit Added or deleted any medications: No Patient Arrived: Ambulatory Any new allergies or adverse reactions: No Arrival Time: 09:14 Had a fall or experienced change in No Accompanied By: friend activities of daily living that may affect Transfer Assistance: None risk of falls: Patient Requires Transmission-Based Precautions: No Signs or symptoms of abuse/neglect since last visito No Patient Has Alerts: No Hospitalized since last visit: No Has Dressing in Place as Prescribed: Yes Pain Present Now: No Electronic Signature(s) Signed: 01/07/2020 4:07:34 PM By: Dayton Martes RCP, RRT, CHT Entered By: Dayton Martes on 01/03/2020 09:16:05 Pursel, [name] ([id number]) -------------------------------------------------------------------------------- Clinic Level of Care Assessment Details Patient Name: [name], [name] Date of Service: 01/03/2020 9:00 AM Medical Record Number: [id number] Patient Account Number: [id number] Date of Birth/Sex: [birth year] (79 y.o. F) Treating RN: [name] Primary Care Jacklyne Baik: [name] Other Clinician: Referring Oluwasemilore Pascuzzi: [name] Treating Nadalie Laughner/Extender: [name], [name] Weeks in Treatment: 6 Clinic Level of Care Assessment Items TOOL 4 Quantity Score []  - Use when only an EandM is performed on FOLLOW-UP visit 0 ASSESSMENTS - Nursing Assessment / Reassessment X - Reassessment of Co-morbidities (includes updates in patient status) 1 10 X- 1 5 Reassessment  of Adherence to Treatment Plan ASSESSMENTS - Wound and Skin Assessment / Reassessment X - Simple Wound Assessment / Reassessment - one wound 1 5 []  - 0 Complex Wound Assessment / Reassessment - multiple wounds []  - 0 Dermatologic / Skin Assessment (not related to wound area) ASSESSMENTS - Focused Assessment []  - Circumferential Edema Measurements - multi extremities 0 []  - 0 Nutritional Assessment / Counseling / Intervention []  - 0 Lower Extremity Assessment (monofilament, tuning fork, pulses) []  - 0 Peripheral Arterial Disease Assessment (using hand held doppler) ASSESSMENTS - Ostomy and/or Continence Assessment and Care []  - Incontinence Assessment and Management 0 []  - 0 Ostomy Care Assessment and Management (repouching, etc.) PROCESS - Coordination of Care X - Simple Patient / Family Education for ongoing care 1 15 []  - 0 Complex (extensive) Patient / Family Education for ongoing care []  - 0 Staff obtains , Records, Test Results / Process Orders []  - 0 Staff telephones HHA, Nursing Homes / Clarify orders / etc []  - 0 Routine Transfer to another Facility (non-emergent condition) []  - 0 Routine Hospital Admission (non-emergent condition) []  - 0 New Admissions / / Ordering NPWT, Apligraf, etc. []  - 0 Emergency Hospital Admission (emergent condition) X- 1 10 Simple Discharge Coordination []  - 0 Complex (extensive) Discharge Coordination PROCESS - Special Needs []  - Pediatric / Minor Patient Management 0 []  - 0 Isolation Patient Management []  - 0 Hearing / Language / Visual special needs []  - 0 Assessment of Community assistance (transportation, D/C planning, etc.) []  - 0 Additional assistance / Altered mentation []  - 0 Support Surface(s) Assessment (bed, cushion, seat, etc.) INTERVENTIONS - Wound Cleansing / Measurement [name], [name] ( ) X- 1 5 Simple Wound Cleansing - one wound []  - 0 Complex Wound Cleansing - multiple  wounds X- 1 5 Wound Imaging (photographs - any number of wounds) []  - 0 Wound Tracing (instead of photographs) X-  1 5 Simple Wound Measurement - one wound []  - 0 Complex Wound Measurement - multiple wounds INTERVENTIONS - Wound Dressings []  - Small Wound Dressing one or multiple wounds 0 X- 1 15 Medium Wound Dressing one or multiple wounds []  - 0 Large Wound Dressing one or multiple wounds []  - 0 Application of Medications - topical []  - 0 Application of Medications - injection INTERVENTIONS - Miscellaneous []  - External ear exam 0 []  - 0 Specimen Collection (cultures, biopsies, blood, body fluids, etc.) []  - 0 Specimen(s) / Culture(s) sent or taken to Lab for analysis []  - 0 Patient Transfer (multiple staff / / Similar devices) []  - 0 Simple Staple / Suture removal (25 or less) []  - 0 Complex Staple / Suture removal (26 or more) []  - 0 Hypo / Hyperglycemic Management (close monitor of Blood Glucose) []  - 0 Ankle / Brachial Index (ABI) - do not check if billed separately X- 1 5 Vital Signs Has the patient been seen at the hospital within the last three years: Yes Total Score: 80 Level Of Care: New/Established - Level 3 Electronic Signature(s) Signed: 01/03/2020 6:14:49 PM By: , [name], RN, [name], [name] RN, [name] Entered By: , [name], RN, [name], [name] on 01/03/2020 09:31:33 [name], [name] ( ) -------------------------------------------------------------------------------- Encounter Discharge Information Details Patient Name: [name], [name] Date of Service: 01/03/2020 9:00 AM Medical Record Number: Patient Account Number: Date of Birth/Sex: 1940-08-17 (79 y.o. F) Treating RN: Primary Care Keilana Morlock: Other Clinician: Referring Macrae Wiegman: Treating Kehinde Bowdish/Extender: 01/05/2020, [name] Weeks in Treatment: 6 Encounter Discharge Information Items Discharge Condition: Stable Ambulatory Status:  Ambulatory Discharge Destination: Home Transportation: Private Auto Accompanied By: self Schedule Follow-up Appointment: Yes Clinical Summary of Care: Electronic Signature(s) Signed: 01/03/2020 6:14:49 PM By: [name], [name], RN, [name], [name] RN, [name] Entered By: 01/05/2020, [name], RN, [name], [name] on 01/03/2020 09:32:24 Steinhilber, Robin B. (01/05/2020) -------------------------------------------------------------------------------- Lower Extremity Assessment Details Patient Name: [name], [name] Date of Service: 01/03/2020 9:00 AM Medical Record Number: [id number] Patient Account Number: 06/21/1940 Date of Birth/Sex: May 07, 1941 (79 y.o. F) Treating RN: [name] Primary Care Loura Pitt: [name] Other Clinician: Referring Eiman Maret: [name] Treating Larkin Alfred/Extender: 01/05/2020, [name] Weeks in Treatment: 6 Edema Assessment Assessed: [Left: No] [Right: No] Edema: [Left: N] [Right: o] Calf Left: Right: Point of Measurement: 30 cm From Medial Instep 36.5 cm cm Ankle Left: Right: Point of Measurement: 10 cm From Medial Instep 22 cm cm Vascular Assessment Pulses: Dorsalis Pedis Palpable: [Left:Yes] Posterior Tibial Palpable: [Left:Yes] Electronic Signature(s) Signed: 01/03/2020 10:36:09 AM By: [name] Signed: 01/03/2020 6:14:49 PM By: [id number], [name], RN, [name], [name] RN, [name] Entered By: 01/05/2020 on 01/03/2020 09:20:54 [name], [name] ([id number]) -------------------------------------------------------------------------------- Multi Wound Chart Details Patient Name: [name], [name] Date of Service: 01/03/2020 9:00 AM Medical Record Number: [id number] Patient Account Number: [name] Date of Birth/Sex: 19-Nov-1940 (79 y.o. F) Treating RN: [name] Primary Care Ruba Outen: 01/05/2020 Other Clinician: Referring Lillan Mccreadie: Benna Dunks Treating Janellie Tennison/Extender: 01/05/2020, [name] Weeks in Treatment: 6 Vital Signs Height(in): 66 Pulse(bpm): 67 Weight(lbs): 192 Blood Pressure(mmHg): 123/61 Body  Mass Index(BMI): 31 Temperature(F): 97.7 Respiratory Rate(breaths/min): 18 Photos: [N/A:N/A] Wound Location: Left, Anterior Lower Leg N/A N/A Wounding Event: Trauma N/A N/A Primary Etiology: Trauma, Other N/A N/A Comorbid History: Cataracts, Arrhythmia, Congestive N/A N/A Heart Failure, Hypertension, Gout Date Acquired: 10/22/2019 N/A N/A Weeks of Treatment: 6 N/A N/A Wound Status: Open N/A N/A Measurements L x W x D (cm) 0.3x0.5x0.1  N/A N/A Area (cm) : 0.118 N/A N/A Volume (cm) : 0.012 N/A N/A % Reduction in Area: 98.30% N/A N/A % Reduction in Volume: 98.30% N/A N/A Classification: Full Thickness Without Exposed N/A N/A Support Structures Exudate Amount: Medium N/A N/A Exudate Type: Serosanguineous N/A N/A Exudate Color: red, brown N/A N/A Wound Margin: Flat and Intact N/A N/A Granulation Amount: Large (67-100%) N/A N/A Granulation Quality: Red N/A N/A Necrotic Amount: Small (1-33%) N/A N/A Exposed Structures: Fat Layer (Subcutaneous Tissue) N/A N/A Exposed: Yes Fascia: No Tendon: No Muscle: No Joint: No Bone: No Epithelialization: Medium (34-66%) N/A N/A Treatment Notes Electronic Signature(s) Signed: 01/03/2020 6:14:49 PM By: Elliot GurneyWoody, [name], RN, [name], [name] RN, [name] Entered By: Elliot GurneyWoody, [name], RN, [name], [name] on 01/03/2020 09:25:36 Erion, Teckla B. ([id number]) -------------------------------------------------------------------------------- Multi-Disciplinary Care Plan Details Patient Name: [name], [name] Date of Service: 01/03/2020 9:00 AM Medical Record Number: [id number] Patient Account Number: [id number] Date of Birth/Sex: Oct 19, 1940 (79 y.o. F) Treating RN: Huel CoventryWoody, [name] Primary Care Boyde Grieco: Bethann PunchesMiller, Mark Other Clinician: Referring Christena Sunderlin: Bethann PunchesMiller, Mark Treating Taryll Reichenberger/Extender: Linwood DibblesSTONE III, [name] Weeks in Treatment: 6 Active Inactive Abuse / Safety / Falls / Self Care Management Nursing Diagnoses: History of Falls Goals: Patient will remain injury free related to falls Date  Initiated: 11/22/2019 Target Resolution Date: 11/29/2019 Goal Status: Active Interventions: Assess fall risk on admission and as needed Notes: Necrotic Tissue Nursing Diagnoses: Impaired tissue integrity related to necrotic/devitalized tissue Goals: Necrotic/devitalized tissue will be minimized in the wound bed Date Initiated: 11/22/2019 Target Resolution Date: 12/06/2019 Goal Status: Active Interventions: Provide education on necrotic tissue and debridement process Treatment Activities: Excisional debridement : 11/22/2019 Notes: Orientation to the Wound Care Program Nursing Diagnoses: Knowledge deficit related to the wound healing center program Goals: Patient/caregiver will verbalize understanding of the Wound Healing Center Program Date Initiated: 11/22/2019 Target Resolution Date: 11/22/2019 Goal Status: Active Interventions: Provide education on orientation to the wound center Notes: Pain, Acute or Chronic Nursing Diagnoses: Pain Management - Non-cyclic Acute (Procedural) Goals: Patient will verbalize adequate pain control and receive pain control interventions during procedures as needed Cawley, Sarika B. ([id number]) Date Initiated: 11/22/2019 Target Resolution Date: 11/29/2019 Goal Status: Active Interventions: Complete pain assessment as per visit requirements Treatment Activities: Administer pain control measures as ordered : 11/22/2019 Notes: Wound/Skin Impairment Nursing Diagnoses: Impaired tissue integrity Goals: Ulcer/skin breakdown will have a volume reduction of 30% by week 4 Date Initiated: 11/22/2019 Target Resolution Date: 12/28/2019 Goal Status: Active Interventions: Assess ulceration(s) every visit Treatment Activities: Topical wound management initiated : 11/22/2019 Notes: Electronic Signature(s) Signed: 01/03/2020 6:14:49 PM By: Elliot GurneyWoody, [name], RN, [name], [name] RN, [name] Entered By: Elliot GurneyWoody, [name], RN, [name], [name] on 01/03/2020 09:25:26 [name], [name]  ([id number]) -------------------------------------------------------------------------------- Pain Assessment Details Patient Name: [name], [name] Date of Service: 01/03/2020 9:00 AM Medical Record Number: [id number] Patient Account Number: [id number] Date of Birth/Sex: Oct 19, 1940 (79 y.o. F) Treating RN: Huel CoventryWoody, [name] Primary Care Cierah Crader: Bethann PunchesMiller, Mark Other Clinician: Referring Marykatherine Sherwood: Bethann PunchesMiller, Mark Treating Alisa Stjames/Extender: Linwood DibblesSTONE III, [name] Weeks in Treatment: 6 Active Problems Location of Pain Severity and Description of Pain Patient Has Paino No Site Locations With Dressing Change: No Pain Management and Medication Current Pain Management: Electronic Signature(s) Signed: 01/03/2020 10:36:09 AM By: Benna DunksLineberry, Carol Signed: 01/03/2020 6:14:49 PM By: Elliot GurneyWoody, [name], RN, [name], [name] RN, [name] Entered By: Benna DunksLineberry, Carol on 01/03/2020 09:17:37 [name], [name] ([id number]) -------------------------------------------------------------------------------- Patient/Caregiver Education Details Patient Name: Fielden, Azzure B. Date of Service: 01/03/2020 9:00 AM Medical Record Number: [id number] Patient Account Number: [id number] Date of  Birth/Gender: 1941/04/15 (79 y.o. F) Treating RN: [name] Primary Care Physician: [name] Other Clinician: Referring Physician: [name] Treating Physician/Extender: [name], [name] Weeks in Treatment: 6 Education Assessment Education Provided To: Patient Education Topics Provided Wound/Skin Impairment: Handouts: Caring for Your Ulcer Methods: Demonstration, Explain/Verbal Responses: State content correctly Electronic Signature(s) Signed: 01/03/2020 6:14:49 PM By: [name], [name], RN, [name], [name] RN, [name] Entered By: [name], [name], RN, [name], [name] on 01/03/2020 09:31:55 Chagoya, Faryn B. ([id number]) -------------------------------------------------------------------------------- Wound Assessment Details Patient Name: Madan, Camilla B. Date of Service: 01/03/2020 9:00 AM Medical  Record Number: [id number] Patient Account Number: [id number] Date of Birth/Sex: May 29, 1940 (79 y.o. F) Treating RN: [name] Primary Care Tiannah Greenly: [name] Other Clinician: Referring Aayat Hajjar: [name] Treating Daiel Strohecker/Extender: [name], [name] Weeks in Treatment: 6 Wound Status Wound Number: 2 Primary Trauma, Other Etiology: Wound Location: Left, Anterior Lower Leg Wound Status: Open Wounding Event: Trauma Comorbid Cataracts, Arrhythmia, Congestive Heart Failure, Date Acquired: 10/22/2019 History: Hypertension, Gout Weeks Of Treatment: 6 Clustered Wound: No Photos Wound Measurements Length: (cm) 0.3 Width: (cm) 0.5 Depth: (cm) 0.1 Area: (cm) 0.118 Volume: (cm) 0.012 % Reduction in Area: 98.3% % Reduction in Volume: 98.3% Epithelialization: Medium (34-66%) Wound Description Classification: Full Thickness Without Exposed Support Struct Wound Margin: Flat and Intact Exudate Amount: Medium Exudate Type: Serosanguineous Exudate Color: red, brown ures Foul Odor After Cleansing: No Slough/Fibrino Yes Wound Bed Granulation Amount: Large (67-100%) Exposed Structure Granulation Quality: Red Fascia Exposed: No Necrotic Amount: Small (1-33%) Fat Layer (Subcutaneous Tissue) Exposed: Yes Necrotic Quality: Adherent Slough Tendon Exposed: No Muscle Exposed: No Joint Exposed: No Bone Exposed: No Treatment Notes Wound #2 (Left, Anterior Lower Leg) Notes Silver cell, abd, conform Electronic Signature(s) Signed: 01/03/2020 10:36:09 AM By: Grandville Silos, Nakari B. ([id number]) Signed: 01/03/2020 6:14:49 PM By: [name], [name], RN, [name], [name] RN, [name] Entered By: Benna Dunks on 01/03/2020 09:19:08 Glazier, Joycelin B. ([id number]) -------------------------------------------------------------------------------- Vitals Details Patient Name: Rodelo, Tezra B. Date of Service: 01/03/2020 9:00 AM Medical Record Number: [id number] Patient Account Number: [id number] Date of Birth/Sex:  20-Jul-1940 (79 y.o. F) Treating RN: [name] Primary Care Jenell Dobransky: [name] Other Clinician: Referring Mackenzye Mackel: [name] Treating Trinitee Horgan/Extender: [name], [name] Weeks in Treatment: 6 Vital Signs Time Taken: 09:10 Temperature (F): 97.7 Height (in): 66 Pulse (bpm): 67 Weight (lbs): 192 Respiratory Rate (breaths/min): 18 Body Mass Index (BMI): 31 Blood Pressure (mmHg): 123/61 Reference Range: 80 - 120 mg / dl Electronic Signature(s) Signed: 01/07/2020 4:07:34 PM By: Dayton Martes RCP, RRT, CHT Entered By: Dayton Martes on 01/03/2020 09:16:41

## 2020-01-10 ENCOUNTER — Other Ambulatory Visit: Payer: Self-pay

## 2020-01-10 ENCOUNTER — Encounter: Payer: Medicare Other | Admitting: Physician Assistant

## 2020-01-10 DIAGNOSIS — L97828 Non-pressure chronic ulcer of other part of left lower leg with other specified severity: Secondary | ICD-10-CM | POA: Diagnosis not present

## 2020-01-10 NOTE — Progress Notes (Addendum)
Plitt, Dnasia B. ([id number]) Visit Report for 01/10/2020 Chief Complaint Document Details Patient Name: [name], [name] Date of Service: 01/10/2020 8:30 AM Medical Record Number: [id number] Patient Account Number: [id number] Date of Birth/Sex: 02/16/1941 (79 y.o. F) Treating RN: [name] Primary Care Provider: [name] Other Clinician: Referring Provider: [name] Treating Provider/Extender: [name], Dimitris Shanahan Weeks in Treatment: 7 Information Obtained from: Patient Chief Complaint Left LE ulcer Electronic Signature(s) Signed: 01/10/2020 8:50:43 AM By: Lenda Kelp PA-C Entered By: Lenda Kelp on 01/10/2020 08:50:42 [name], [name] ([id number]) -------------------------------------------------------------------------------- HPI Details Patient Name: [name], [name] Date of Service: 01/10/2020 8:30 AM Medical Record Number: [id number] Patient Account Number: [id number] Date of Birth/Sex: 1940/08/24 (79 y.o. F) Treating RN: [name] Primary Care Provider: [name] Other Clinician: Referring Provider: [name] Treating Provider/Extender: [name], Yoshino Broccoli Weeks in Treatment: 7 History of Present Illness HPI Description: 06/07/2019 upon evaluation today patient presents today for initial evaluation in our clinic concerning issues she is having with her left lower extremity. She tells me that she hit her leg on a rocking chair when she was getting up and this was roughly at the beginning of December. Dr. Hyacinth Meeker who is her primary care provider has been helping with treating this area since that time. The patient states unfortunately this just is not getting any better. He is attempted salt water soaks as well as using antibiotic ointments topically unfortunately she has not noted much improvement. He wanted her to come to the wound center sooner but she declined although when things did not seem to be getting better she finally consented to this. Currently the patient does appear to  have cellulitis of left lower extremity she also has atrial fibrillation noted for which she is on Eliquis this is probably part of the reason that she developed a large hematoma that she did. Subsequently she also has hypertension as well as congestive heart failure. The patient does not appear to be febrile today and really is doing fairly well as far as that is concerned although the leg is warm to touch pretty much throughout. No fevers, chills, nausea, vomiting, or diarrhea. She has not been on any oral antibiotics. 06/28/2019 patient actually is seen today in follow-up for her lower extremity ulcer. She is status post having actually spent 9 days in the hospital following when I sent her over for evaluation. They did have to keep her in the hospital and treat her with IV vancomycin secondary to the fact that she unfortunately had a bacteria present that obviously can be managed with oral medication with her penicillin allergy. Nonetheless her leg appears to be doing tremendously better today. There is no signs of active infection and overall very pleased with how things are progressing. I do believe that the wound is also measuring better along with looking much less irritated overall I think she is going to do quite well at this point. 07/05/2019 on evaluation today patient actually appears to be doing quite well with regard to her wound which is measuring smaller and still appears to be doing tremendously better compared to prior evaluation. I do believe the Melburn Popper has been of benefit for her. 07/12/19 patient's wound actually appears to be doing excellent at this time in regard to the left posterior lower extremity. She has been tolerating the dressing changes without complication. Fortunately there is much improvement noted here there is some slough noted she has been using Santyl I think that still appropriate. With that  being said I am going to go ahead and perform sharp debridement today to  clear away some of the necrotic material as well. 07/19/2019 upon evaluation today patient actually appears to be doing quite well in regard to her leg ulcer. She has been using Santyl and has been doing excellent with this up to this point. However I think based on what I am seeing today we can likely proceed to doing something else, collagen, to help with getting this wound to heal more effectively and quickly. 07/26/2019 upon evaluation today patient appears to be doing excellent in regard to her lower extremity ulcer. She has been tolerating the dressing changes without complication. Fortunately the wound is continuing to measure smaller and does seem to be progressing quite nicely. I am very pleased with how things are going at this point. The patient likewise is extremely happy. 08/02/2019 upon evaluation today patient appears to be healing quite nicely and is making excellent progress at this point. Fortunately there is no evidence of active infection at this time which is good news. No fever chills noted 08/09/2019 on evaluation today patient's wound actually is continuing to show signs of excellent improvement. Is measuring much smaller this week even compared to last week and overall I am extremely pleased with the progress. Fortunately there is no evidence of active infection at this time. 08/23/2019 upon evaluation today patient appears to be doing well with regard to her original wound she is showing some signs of improvement here which is great news. Fortunately there is no signs of active infection at this time. No fevers, chills, nausea, vomiting, or diarrhea. 08/30/2019 upon evaluation today patient actually appears to be completely healed. She has little bit of dry skin at the location where the wound was but overall she has done extremely well. There is no signs of active infection at this time which is great news. Readmission: 11/22/2019 upon evaluation today patient appears to be doing  somewhat poorly in regard to her leg ulcer. She tells me unfortunately that she fell out of bed about 1 month ago and subsequently has been monitoring this since she initially had a hematoma and it sounds like they were trying to get this to reabsorb Dr. Hyacinth MeekerMiller that is her primary care provider. With that being said her podiatrist apparently tried to open this up according to what the patient tells me today and then subsequently gave her an antibiotic for her toe and said that would also help with the area on her leg. They were not able to get anything to come out as her podiatrist stated that this seemed to be too thick in order to drain. With that being said the patient was eventually referred back here to the wound center for further management. 11/29/2019 on evaluation today patient appears to be doing a little bit more poorly today regarding her wound as compared to her last visit here. Again this is due to blistering on the edges of the wound which is very superficial but nonetheless new since last time. This is likely due to the fact that she did have to take off her compression wrap as she was having significant discomfort due to gout that flared up in her foot. Nonetheless she was seen by her primary care provider where she was given a shot of steroids and has new medication for gout that she is now taking. She still having discomfort however she states she does not believe she is good to be able to  wear the wrap. 12/19/2019 on evaluation today patient actually appears to be making excellent progress in regard to her wound. She has been tolerating the dressing changes without complication. Fortunately there is no signs of active infection at this time. 12/27/2019 on evaluation today patient appears to be doing well with regard to her wound. This is measuring significantly smaller overall feel like Antrobus, Blondine B. ([id number]) the silver cell has done excellent for her. Fortunately there is no signs  of active infection at this time. 01/03/2020 upon evaluation today patient's wound actually is very close to complete resolution. She in fact is measuring significantly smaller and there does not appear to be any evidence of significant drainage really I feel like this will be healed by next week. 01/10/2020 on evaluation today patient actually appears to be doing quite well in regard to her wound in fact this appears to be completely healed and I am very pleased that she has done so well. Fortunately there is no signs of active infection at this time. Electronic Signature(s) Signed: 01/10/2020 10:55:36 AM By: Lenda Kelp PA-C Entered By: Lenda Kelp on 01/10/2020 10:55:36 [name], [name] ([id number]) -------------------------------------------------------------------------------- Physical Exam Details Patient Name: [name], [name] Date of Service: 01/10/2020 8:30 AM Medical Record Number: [id number] Patient Account Number: [id number] Date of Birth/Sex: 1941/04/08 (79 y.o. F) Treating RN: [name] Primary Care Provider: [name] Other Clinician: Referring Provider: [name] Treating Provider/Extender: [name], Javani Spratt Weeks in Treatment: 7 Constitutional Well-nourished and well-hydrated in no acute distress. Respiratory normal breathing without difficulty. Psychiatric this patient is able to make decisions and demonstrates good insight into disease process. Alert and Oriented x 3. pleasant and cooperative. Notes Patient's wound showed signs again of complete epithelization everything is closed and I see no issues at this point. Electronic Signature(s) Signed: 01/10/2020 10:55:59 AM By: Lenda Kelp PA-C Entered By: Lenda Kelp on 01/10/2020 10:55:58 Gervasi, Crescent B. ([id number]) -------------------------------------------------------------------------------- Physician Orders Details Patient Name: Bilotti, Farheen B. Date of Service: 01/10/2020 8:30 AM Medical Record Number:  [id number] Patient Account Number: [id number] Date of Birth/Sex: 11-08-1940 (79 y.o. F) Treating RN: Tyler Aas Primary Care Provider: [name] Other Clinician: Referring Provider: [name] Treating Provider/Extender: [name], Kimber Fritts Weeks in Treatment: 7 Verbal / Phone Orders: No Diagnosis Coding ICD-10 Coding Code Description S81.802A Unspecified open wound, left lower leg, initial encounter I87.2 Venous insufficiency (chronic) (peripheral) L97.828 Non-pressure chronic ulcer of other part of left lower leg with other specified severity L03.116 Cellulitis of left lower limb I48.0 Paroxysmal atrial fibrillation I10 Essential (primary) hypertension I50.42 Chronic combined systolic (congestive) and diastolic (congestive) heart failure Z79.01 Long term (current) use of anticoagulants Discharge From [facility] Services o Discharge from Wound Care Center - treatment complete Electronic Signature(s) Signed: 01/10/2020 5:05:31 PM By: Tyler Aas Signed: 01/10/2020 5:28:43 PM By: Lenda Kelp PA-C Entered By: Tyler Aas on 01/10/2020 08:52:22 [name], [name] ([id number]) -------------------------------------------------------------------------------- Problem List Details Patient Name: [name], [name] Date of Service: 01/10/2020 8:30 AM Medical Record Number: [id number] Patient Account Number: [id number] Date of Birth/Sex: 29-Mar-1941 (79 y.o. F) Treating RN: [name] Primary Care Provider: [name] Other Clinician: Referring Provider: [name] Treating Provider/Extender: [name], Fredis Malkiewicz Weeks in Treatment: 7 Active Problems ICD-10 Encounter Code Description Active Date MDM Diagnosis S81.802A Unspecified open wound, left lower leg, initial encounter 11/22/2019 No Yes I87.2 Venous insufficiency (chronic) (peripheral) 12/09/2019 No Yes L97.828 Non-pressure chronic ulcer of other part of left lower leg with other 11/22/2019 No  Yes specified severity L03.116 Cellulitis  of left lower limb 11/22/2019 No Yes I48.0 Paroxysmal atrial fibrillation 11/22/2019 No Yes I10 Essential (primary) hypertension 11/22/2019 No Yes I50.42 Chronic combined systolic (congestive) and diastolic (congestive) heart 11/22/2019 No Yes failure Z79.01 Long term (current) use of anticoagulants 11/22/2019 No Yes Inactive Problems Resolved Problems Electronic Signature(s) Signed: 01/10/2020 8:50:37 AM By: Lenda KelpStone III, [place] Duquette PA-C Entered By: Lenda KelpStone III, Garald Rhew on 01/10/2020 08:50:37 [name], [name] ([id number]) -------------------------------------------------------------------------------- Progress Note Details Patient Name: [name], [name] Date of Service: 01/10/2020 8:30 AM Medical Record Number: [id number] Patient Account Number: [id number] Date of Birth/Sex: Mar 04, 1941 (79 y.o. F) Treating RN: Huel CoventryWoody, [name] Primary Care Provider: Bethann PunchesMiller, Mark Other Clinician: Referring Provider: Bethann PunchesMiller, Mark Treating Provider/Extender: Linwood DibblesSTONE III, Khairi Garman Weeks in Treatment: 7 Subjective Chief Complaint Information obtained from Patient Left LE ulcer History of Present Illness (HPI) 06/07/2019 upon evaluation today patient presents today for initial evaluation in our clinic concerning issues she is having with her left lower extremity. She tells me that she hit her leg on a rocking chair when she was getting up and this was roughly at the beginning of December. Dr. Hyacinth MeekerMiller who is her primary care provider has been helping with treating this area since that time. The patient states unfortunately this just is not getting any better. He is attempted salt water soaks as well as using antibiotic ointments topically unfortunately she has not noted much improvement. He wanted her to come to the wound center sooner but she declined although when things did not seem to be getting better she finally consented to this. Currently the patient does appear to have cellulitis of left lower extremity she also has atrial fibrillation noted for  which she is on Eliquis this is probably part of the reason that she developed a large hematoma that she did. Subsequently she also has hypertension as well as congestive heart failure. The patient does not appear to be febrile today and really is doing fairly well as far as that is concerned although the leg is warm to touch pretty much throughout. No fevers, chills, nausea, vomiting, or diarrhea. She has not been on any oral antibiotics. 06/28/2019 patient actually is seen today in follow-up for her lower extremity ulcer. She is status post having actually spent 9 days in the hospital following when I sent her over for evaluation. They did have to keep her in the hospital and treat her with IV vancomycin secondary to the fact that she unfortunately had a bacteria present that obviously can be managed with oral medication with her penicillin allergy. Nonetheless her leg appears to be doing tremendously better today. There is no signs of active infection and overall very pleased with how things are progressing. I do believe that the wound is also measuring better along with looking much less irritated overall I think she is going to do quite well at this point. 07/05/2019 on evaluation today patient actually appears to be doing quite well with regard to her wound which is measuring smaller and still appears to be doing tremendously better compared to prior evaluation. I do believe the Melburn PopperSantyl has been of benefit for her. 07/12/19 patient's wound actually appears to be doing excellent at this time in regard to the left posterior lower extremity. She has been tolerating the dressing changes without complication. Fortunately there is much improvement noted here there is some slough noted she has been using Santyl I think that still appropriate. With that being said I am going to go  ahead and perform sharp debridement today to clear away some of the necrotic material as well. 07/19/2019 upon evaluation today  patient actually appears to be doing quite well in regard to her leg ulcer. She has been using Santyl and has been doing excellent with this up to this point. However I think based on what I am seeing today we can likely proceed to doing something else, collagen, to help with getting this wound to heal more effectively and quickly. 07/26/2019 upon evaluation today patient appears to be doing excellent in regard to her lower extremity ulcer. She has been tolerating the dressing changes without complication. Fortunately the wound is continuing to measure smaller and does seem to be progressing quite nicely. I am very pleased with how things are going at this point. The patient likewise is extremely happy. 08/02/2019 upon evaluation today patient appears to be healing quite nicely and is making excellent progress at this point. Fortunately there is no evidence of active infection at this time which is good news. No fever chills noted 08/09/2019 on evaluation today patient's wound actually is continuing to show signs of excellent improvement. Is measuring much smaller this week even compared to last week and overall I am extremely pleased with the progress. Fortunately there is no evidence of active infection at this time. 08/23/2019 upon evaluation today patient appears to be doing well with regard to her original wound she is showing some signs of improvement here which is great news. Fortunately there is no signs of active infection at this time. No fevers, chills, nausea, vomiting, or diarrhea. 08/30/2019 upon evaluation today patient actually appears to be completely healed. She has little bit of dry skin at the location where the wound was but overall she has done extremely well. There is no signs of active infection at this time which is great news. Readmission: 11/22/2019 upon evaluation today patient appears to be doing somewhat poorly in regard to her leg ulcer. She tells me unfortunately that she  fell out of bed about 1 month ago and subsequently has been monitoring this since she initially had a hematoma and it sounds like they were trying to get this to reabsorb Dr. Hyacinth Meeker that is her primary care provider. With that being said her podiatrist apparently tried to open this up according to what the patient tells me today and then subsequently gave her an antibiotic for her toe and said that would also help with the area on her leg. They were not able to get anything to come out as her podiatrist stated that this seemed to be too thick in order to drain. With that being said the patient was eventually referred back here to the wound center for further management. 11/29/2019 on evaluation today patient appears to be doing a little bit more poorly today regarding her wound as compared to her last visit here. Again this is due to blistering on the edges of the wound which is very superficial but nonetheless new since last time. This is likely due to the fact that she did have to take off her compression wrap as she was having significant discomfort due to gout that flared up in her foot. Nonetheless she was seen by her primary care provider where she was given a shot of steroids and has new medication for gout that she is now taking. She still having discomfort however she states she does not believe she is good to be able to wear the wrap. [name], [name] ([id number])  12/19/2019 on evaluation today patient actually appears to be making excellent progress in regard to her wound. She has been tolerating the dressing changes without complication. Fortunately there is no signs of active infection at this time. 12/27/2019 on evaluation today patient appears to be doing well with regard to her wound. This is measuring significantly smaller overall feel like the silver cell has done excellent for her. Fortunately there is no signs of active infection at this time. 01/03/2020 upon evaluation today patient's  wound actually is very close to complete resolution. She in fact is measuring significantly smaller and there does not appear to be any evidence of significant drainage really I feel like this will be healed by next week. 01/10/2020 on evaluation today patient actually appears to be doing quite well in regard to her wound in fact this appears to be completely healed and I am very pleased that she has done so well. Fortunately there is no signs of active infection at this time. Objective Constitutional Well-nourished and well-hydrated in no acute distress. Vitals Time Taken: 8:40 AM, Height: 66 in, Weight: 192 lbs, BMI: 31, Temperature: 97.9 F, Pulse: 78 bpm, Respiratory Rate: 16 breaths/min, Blood Pressure: 118/65 mmHg. Respiratory normal breathing without difficulty. Psychiatric this patient is able to make decisions and demonstrates good insight into disease process. Alert and Oriented x 3. pleasant and cooperative. General Notes: Patient's wound showed signs again of complete epithelization everything is closed and I see no issues at this point. Integumentary (Hair, Skin) Wound #2 status is Healed - Epithelialized. Original cause of wound was Trauma. The wound is located on the Left,Anterior Lower Leg. The wound measures 0cm length x 0cm width x 0cm depth; 0cm^2 area and 0cm^3 volume. There is Fat Layer (Subcutaneous Tissue) Exposed exposed. There is a medium amount of serosanguineous drainage noted. The wound margin is flat and intact. There is large (67-100%) red granulation within the wound bed. There is a small (1-33%) amount of necrotic tissue within the wound bed including Adherent Slough. Assessment Active Problems ICD-10 Unspecified open wound, left lower leg, initial encounter Venous insufficiency (chronic) (peripheral) Non-pressure chronic ulcer of other part of left lower leg with other specified severity Cellulitis of left lower limb Paroxysmal atrial fibrillation Essential  (primary) hypertension Chronic combined systolic (congestive) and diastolic (congestive) heart failure Long term (current) use of anticoagulants Plan Discharge From [facility] Services: Discharge from Wound Care Center - treatment complete [name], [name] ([id number]) 1. I would recommend currently that we go and discontinue wound care services as the patient appears to be completely healed this is great news. There is no signs of active infection at this time. 2. I am also can recommend at this point that we go ahead and have the patient just use moisturizing lotion over the area I think that still the best way to go in order to help protect the skin going forward. Follow-up as needed Electronic Signature(s) Signed: 01/10/2020 10:57:06 AM By: Lenda Kelp PA-C Entered By: Lenda Kelp on 01/10/2020 10:57:05 [name], [name] ([id number]) -------------------------------------------------------------------------------- SuperBill Details Patient Name: [name], [name] Date of Service: 01/10/2020 Medical Record Number: [id number] Patient Account Number: [id number] Date of Birth/Sex: 10/04/1940 (79 y.o. F) Treating RN: [name] Primary Care Provider: [name] Other Clinician: Referring Provider: [name] Treating Provider/Extender: [name], Eilish Mcdaniel Weeks in Treatment: 7 Diagnosis Coding ICD-10 Codes Code Description [phone] Unspecified open wound, left lower leg, initial encounter I87.2 Venous insufficiency (chronic) (peripheral) L97.828 Non-pressure chronic ulcer of other  part of left lower leg with other specified severity L03.116 Cellulitis of left lower limb I48.0 Paroxysmal atrial fibrillation I10 Essential (primary) hypertension I50.42 Chronic combined systolic (congestive) and diastolic (congestive) heart failure Z79.01 Long term (current) use of anticoagulants Facility Procedures CPT4 Code: [id number] Description: [phone] - WOUND CARE VISIT-LEV 2 EST PT Modifier: Quantity:  1 Physician Procedures CPT4 Code: [id number] Description: 99213 - WC PHYS LEVEL 3 - EST PT Modifier: Quantity: 1 CPT4 Code: Description: ICD-10 Diagnosis Description S81.802A Unspecified open wound, left lower leg, initial encounter I87.2 Venous insufficiency (chronic) (peripheral) L97.828 Non-pressure chronic ulcer of other part of left lower leg with other spe Modifier: cified severity Quantity: Electronic Signature(s) Signed: 01/10/2020 10:57:31 AM By: Lenda Kelp PA-C Entered By: Lenda Kelp on 01/10/2020 10:57:31

## 2020-01-10 NOTE — Progress Notes (Signed)
Reifschneider, Julianne B. ([id number]) Visit Report for 01/10/2020 Arrival Information Details Patient Name: [name], [name] Date of Service: 01/10/2020 8:30 AM Medical Record Number: [id number] Patient Account Number: [id number] Date of Birth/Sex: 1940-09-16 (79 y.o. F) Treating RN: Tyler AasButler, Michelle Primary Care Muad Noga: Bethann PunchesMiller, Mark Other Clinician: Referring Cannon Quinton: Bethann PunchesMiller, Mark Treating Jhan Conery/Extender: Linwood DibblesSTONE III, [name] Weeks in Treatment: 7 Visit Information History Since Last Visit Added or deleted any medications: No Patient Arrived: Ambulatory Had a fall or experienced change in No Arrival Time: 08:39 activities of daily living that may affect Accompanied By: friend risk of falls: Transfer Assistance: None Hospitalized since last visit: No Patient Identification Verified: Yes Pain Present Now: No Patient Requires Transmission-Based Precautions: No Patient Has Alerts: No Electronic Signature(s) Signed: 01/10/2020 5:05:31 PM By: Tyler AasButler, Michelle Entered By: Tyler AasButler, Michelle on 01/10/2020 08:39:58 [name], [name] ([id number]) -------------------------------------------------------------------------------- Clinic Level of Care Assessment Details Patient Name: [name], [name] Date of Service: 01/10/2020 8:30 AM Medical Record Number: [id number] Patient Account Number: [id number] Date of Birth/Sex: 1940-09-16 (79 y.o. F) Treating RN: Tyler AasButler, Michelle Primary Care Sabria Florido: Bethann PunchesMiller, Mark Other Clinician: Referring Calieb Lichtman: Bethann PunchesMiller, Mark Treating Analeia Ismael/Extender: Linwood DibblesSTONE III, [name] Weeks in Treatment: 7 Clinic Level of Care Assessment Items TOOL 4 Quantity Score []  - Use when only an EandM is performed on FOLLOW-UP visit 0 ASSESSMENTS - Nursing Assessment / Reassessment X - Reassessment of Co-morbidities (includes updates in patient status) 1 10 X- 1 5 Reassessment of Adherence to Treatment Plan ASSESSMENTS - Wound and Skin Assessment / Reassessment X - Simple Wound Assessment / Reassessment -  one wound 1 5 []  - 0 Complex Wound Assessment / Reassessment - multiple wounds []  - 0 Dermatologic / Skin Assessment (not related to wound area) ASSESSMENTS - Focused Assessment []  - Circumferential Edema Measurements - multi extremities 0 []  - 0 Nutritional Assessment / Counseling / Intervention []  - 0 Lower Extremity Assessment (monofilament, tuning fork, pulses) []  - 0 Peripheral Arterial Disease Assessment (using hand held doppler) ASSESSMENTS - Ostomy and/or Continence Assessment and Care []  - Incontinence Assessment and Management 0 []  - 0 Ostomy Care Assessment and Management (repouching, etc.) PROCESS - Coordination of Care X - Simple Patient / Family Education for ongoing care 1 15 []  - 0 Complex (extensive) Patient / Family Education for ongoing care []  - 0 Staff obtains ChiropractorConsents, Records, Test Results / Process Orders []  - 0 Staff telephones HHA, Nursing Homes / Clarify orders / etc []  - 0 Routine Transfer to another Facility (non-emergent condition) []  - 0 Routine Hospital Admission (non-emergent condition) []  - 0 New Admissions / Manufacturing engineernsurance Authorizations / Ordering NPWT, Apligraf, etc. []  - 0 Emergency Hospital Admission (emergent condition) []  - 0 Simple Discharge Coordination X- 1 15 Complex (extensive) Discharge Coordination PROCESS - Special Needs []  - Pediatric / Minor Patient Management 0 []  - 0 Isolation Patient Management []  - 0 Hearing / Language / Visual special needs []  - 0 Assessment of Community assistance (transportation, D/C planning, etc.) []  - 0 Additional assistance / Altered mentation []  - 0 Support Surface(s) Assessment (bed, cushion, seat, etc.) INTERVENTIONS - Wound Cleansing / Measurement [name], [name] ([id number]) X- 1 5 Simple Wound Cleansing - one wound []  - 0 Complex Wound Cleansing - multiple wounds X- 1 5 Wound Imaging (photographs - any number of wounds) []  - 0 Wound Tracing (instead of photographs) X- 1 5 Simple  Wound Measurement - one wound []  - 0 Complex Wound Measurement - multiple wounds INTERVENTIONS - Wound Dressings []  - Small  Wound Dressing one or multiple wounds 0 []  - 0 Medium Wound Dressing one or multiple wounds []  - 0 Large Wound Dressing one or multiple wounds []  - 0 Application of Medications - topical []  - 0 Application of Medications - injection INTERVENTIONS - Miscellaneous []  - External ear exam 0 []  - 0 Specimen Collection (cultures, biopsies, blood, body fluids, etc.) []  - 0 Specimen(s) / Culture(s) sent or taken to Lab for analysis []  - 0 Patient Transfer (multiple staff / Lift / Similar devices) []  - 0 Simple Staple / Suture removal (25 or less) []  - 0 Complex Staple / Suture removal (26 or more) []  - 0 Hypo / Hyperglycemic Management (close monitor of Blood Glucose) []  - 0 Ankle / Brachial Index (ABI) - do not check if billed separately X- 1 5 Vital Signs Has the patient been seen at the hospital within the last three years: Yes Total Score: 70 Level Of Care: New/Established - Level 2 Electronic Signature(s) Signed: 01/10/2020 5:05:31 PM By: Entered By: on 01/10/2020 08:52:44 [name], [name] ( ) -------------------------------------------------------------------------------- Encounter Discharge Information Details Patient Name: [name], [name] Date of Service: 01/10/2020 8:30 AM Medical Record Number: Patient Account Number: Michiel Sites Date of Birth/Sex: 05-15-1941 (79 y.o. F) Treating RN: Primary Care Najeh Credit: Other Clinician: Referring Meriah Shands: 01/12/2020 Treating Kenechukwu Eckstein/Extender: Tyler Aas, [name] Weeks in Treatment: 7 Encounter Discharge Information Items Discharge Condition: Stable Ambulatory Status: Ambulatory Discharge Destination: Home Transportation: Private Auto Accompanied By: friend Schedule Follow-up Appointment: Yes Clinical Summary of  Care: Electronic Signature(s) Signed: 01/10/2020 5:05:31 PM By: 01/12/2020 Entered By: [id number] on 01/10/2020 08:53:53 [name], [name] ([id number]) -------------------------------------------------------------------------------- Lower Extremity Assessment Details Patient Name: [name], [name] Date of Service: 01/10/2020 8:30 AM Medical Record Number: [id number]/21/[id number] Patient Account Number: [id number] Date of Birth/Sex: [birth year] (79 y.o. F) Treating RN: [name] Primary Care Shaquanna Lycan: [name] Other Clinician: Referring Trenita Hulme: 01/12/2020 Treating Kyle Luppino/Extender: Tyler Aas, [name] Weeks in Treatment: 7 Edema Assessment Assessed: [Left: Yes] [Right: Yes] Edema: [Left: No] [Right: No] Electronic Signature(s) Signed: 01/10/2020 5:05:31 PM By: 01/12/2020 Entered By: [id number] on 01/10/2020 08:45:51 Huhn, Rufina B. ([id number]) -------------------------------------------------------------------------------- Multi Wound Chart Details Patient Name: Wernert, Leonna B. Date of Service: 01/10/2020 8:30 AM Medical Record Number: 06/21/1940 Patient Account Number: [id number] Date of Birth/Sex: 05/24/1940 (79 y.o. F) Treating RN: [name] Primary Care Gazella Anglin: [name] Other Clinician: Referring Lorik Guo: 01/12/2020 Treating Christoph Copelan/Extender: Tyler Aas, [name] Weeks in Treatment: 7 Vital Signs Height(in): 66 Pulse(bpm): 78 Weight(lbs): 192 Blood Pressure(mmHg): 118/65 Body Mass Index(BMI): 31 Temperature(F): 97.9 Respiratory Rate(breaths/min): 16 Photos: [2:No Photos] [N/A:N/A] Wound Location: [2:Left, Anterior Lower Leg] [N/A:N/A] Wounding Event: [2:Trauma] [N/A:N/A] Primary Etiology: [2:Trauma, Other] [N/A:N/A] Comorbid History: [2:Cataracts, Arrhythmia, Congestive Heart Failure, Hypertension, Gout] [N/A:N/A] Date Acquired: [2:10/22/2019] [N/A:N/A] Weeks of Treatment: [2:7] [N/A:N/A] Wound Status: [2:Healed - Epithelialized]  [N/A:N/A] Measurements L x W x D (cm) [2:0x0x0] [N/A:N/A] Area (cm) : [2:0] [N/A:N/A] Volume (cm) : [2:0] [N/A:N/A] % Reduction in Area: [2:100.00%] [N/A:N/A] % Reduction in Volume: [2:100.00%] [N/A:N/A] Classification: [2:Full Thickness Without Exposed Support Structures] [N/A:N/A] Exudate Amount: [2:Medium] [N/A:N/A] Exudate Type: [2:Serosanguineous] [N/A:N/A] Exudate Color: [2:red, brown] [N/A:N/A] Wound Margin: [2:Flat and Intact] [N/A:N/A] Granulation Amount: [2:Large (67-100%)] [N/A:N/A] Granulation Quality: [2:Red] [N/A:N/A] Necrotic Amount: [2:Small (1-33%)] [N/A:N/A] Exposed Structures: [2:Fat Layer (Subcutaneous Tissue) Exposed: Yes Fascia: No Tendon: No Muscle: No Joint: No Bone: No Medium (34-66%)] [N/A:N/A N/A] Treatment Notes Electronic Signature(s) Signed: 01/10/2020 5:05:31 PM  By: Tyler Aas Entered By: Tyler Aas on 01/10/2020 08:52:00 [name], [name] ([id number]) -------------------------------------------------------------------------------- Multi-Disciplinary Care Plan Details Patient Name: Biss, Lacresia B. Date of Service: 01/10/2020 8:30 AM Medical Record Number: [id number] Patient Account Number: [id number] Date of Birth/Sex: 1940/07/31 (79 y.o. F) Treating RN: Tyler Aas Primary Care Jaylea Plourde: [name] Other Clinician: Referring Natalynn Pedone: [name] Treating Simcha Farrington/Extender: [name], [name] Weeks in Treatment: 7 Active Inactive Electronic Signature(s) Signed: 01/10/2020 5:05:31 PM By: Tyler Aas Entered By: Tyler Aas on 01/10/2020 08:51:52 [name], [name] ([id number]) -------------------------------------------------------------------------------- Pain Assessment Details Patient Name: Swint, Yalena B. Date of Service: 01/10/2020 8:30 AM Medical Record Number: [id number] Patient Account Number: [id number] Date of Birth/Sex: 05/04/1941 (79 y.o. F) Treating RN: Tyler Aas Primary Care Eulogio Requena: [name] Other  Clinician: Referring Jessyka Austria: [name] Treating Vy Badley/Extender: [name], [name] Weeks in Treatment: 7 Active Problems Location of Pain Severity and Description of Pain Patient Has Paino No Site Locations Pain Management and Medication Current Pain Management: Electronic Signature(s) Signed: 01/10/2020 5:05:31 PM By: Tyler Aas Entered By: Tyler Aas on 01/10/2020 08:44:37 [name], [name] ([id number]) -------------------------------------------------------------------------------- Patient/Caregiver Education Details Patient Name: [name], [name] Date of Service: 01/10/2020 8:30 AM Medical Record Number: [id number] Patient Account Number: [id number] Date of Birth/Gender: 1940-10-08 (79 y.o. F) Treating RN: Tyler Aas Primary Care Physician: [name] Other Clinician: Referring Physician: [name] Treating Physician/Extender: Skeet Simmer in Treatment: 7 Education Assessment Education Provided To: Patient Education Topics Provided Basic Hygiene: Handouts: Other: lotion legs daily Methods: Explain/Verbal Responses: State content correctly Electronic Signature(s) Signed: 01/10/2020 5:05:31 PM By: Tyler Aas Entered By: Tyler Aas on 01/10/2020 08:53:29 [name], [name] ([id number]) -------------------------------------------------------------------------------- Wound Assessment Details Patient Name: [name], [name] Date of Service: 01/10/2020 8:30 AM Medical Record Number: [id number] Patient Account Number: [id number] Date of Birth/Sex: 1940-12-14 (79 y.o. F) Treating RN: Tyler Aas Primary Care Kasheena Sambrano: [name] Other Clinician: Referring Pedro Whiters: [name] Treating Rielly Brunn/Extender: [name], [name] Weeks in Treatment: 7 Wound Status Wound Number: 2 Primary Trauma, Other Etiology: Wound Location: Left, Anterior Lower Leg Wound Status: Healed - Epithelialized Wounding Event: Trauma Comorbid Cataracts, Arrhythmia,  Congestive Heart Failure, Date Acquired: 10/22/2019 History: Hypertension, Gout Weeks Of Treatment: 7 Clustered Wound: No Wound Measurements Length: (cm) 0 Width: (cm) 0 Depth: (cm) 0 Area: (cm) Volume: (cm) % Reduction in Area: 100% % Reduction in Volume: 100% Epithelialization: Medium (34-66%) 0 0 Wound Description Classification: Full Thickness Without Exposed Support Structu Wound Margin: Flat and Intact Exudate Amount: Medium Exudate Type: Serosanguineous Exudate Color: red, brown res Foul Odor After Cleansing: No Slough/Fibrino Yes Wound Bed Granulation Amount: Large (67-100%) Exposed Structure Granulation Quality: Red Fascia Exposed: No Necrotic Amount: Small (1-33%) Fat Layer (Subcutaneous Tissue) Exposed: Yes Necrotic Quality: Adherent Slough Tendon Exposed: No Muscle Exposed: No Joint Exposed: No Bone Exposed: No Electronic Signature(s) Signed: 01/10/2020 5:05:31 PM By: Tyler Aas Entered By: Tyler Aas on 01/10/2020 08:44:54 Makela, Oralia B. ([id number]) -------------------------------------------------------------------------------- Vitals Details Patient Name: Quiros, Lagena B. Date of Service: 01/10/2020 8:30 AM Medical Record Number: [id number] Patient Account Number: [id number] Date of Birth/Sex: [birth year] (79 y.o. F) Treating RN: Tyler Aas Primary Care Savannah Morford: [name] Other Clinician: Referring Vivi Piccirilli: [name] Treating Marsia Cino/Extender: [name], [name] Weeks in Treatment: 7 Vital Signs Time Taken: 08:40 Temperature (F): 97.9 Height (in): 66 Pulse (bpm): 78 Weight (lbs): 192 Respiratory Rate (breaths/min): 16 Body Mass Index (BMI): 31 Blood Pressure (mmHg): 118/65 Reference Range: 80 - 120 mg / dl Electronic Signature(s) Signed: 01/10/2020 5:05:31  PM By: Tyler Aas Entered By: Tyler Aas on 01/10/2020 08:44:31

## 2020-01-31 ENCOUNTER — Other Ambulatory Visit: Payer: Self-pay | Admitting: Cardiovascular Disease

## 2020-01-31 NOTE — Telephone Encounter (Signed)
I called and spoke w/pt and asked if they were taking carbemazepine and they stated that they only take it about 2 times a year. Will submit a refill

## 2020-01-31 NOTE — Telephone Encounter (Signed)
Interaction carbemazipne will route to pharmd pool 11f 88.5kg Scr 1.71 06/19/19 Lovw/gollan 09/24/19

## 2020-01-31 NOTE — Telephone Encounter (Signed)
Please call the patient to clarify if taking carbamazepine  and how frequent.  If taking every day; will need transition to warfarin. If taking PRN , please request clarification from Dr Mariah Milling

## 2020-01-31 NOTE — Telephone Encounter (Signed)
Refill Request.  

## 2020-02-18 ENCOUNTER — Other Ambulatory Visit: Payer: Self-pay | Admitting: Physical Medicine and Rehabilitation

## 2020-02-18 DIAGNOSIS — M545 Low back pain, unspecified: Secondary | ICD-10-CM

## 2020-03-09 ENCOUNTER — Ambulatory Visit
Admission: RE | Admit: 2020-03-09 | Discharge: 2020-03-09 | Disposition: A | Discharge status: Home / Self Care | Payer: Medicare Other | Source: Ambulatory Visit | Attending: Physical Medicine and Rehabilitation | Admitting: Physical Medicine and Rehabilitation

## 2020-03-09 ENCOUNTER — Other Ambulatory Visit: Payer: Self-pay

## 2020-03-09 DIAGNOSIS — M545 Low back pain, unspecified: Secondary | ICD-10-CM

## 2020-03-12 ENCOUNTER — Other Ambulatory Visit: Payer: Self-pay

## 2020-03-12 ENCOUNTER — Inpatient Hospital Stay
Admission: EM | Admit: 2020-03-12 | Discharge: 2020-03-19 | DRG: 871 | Disposition: A | Discharge status: Home Health | Payer: Medicare Other | Attending: Internal Medicine | Admitting: Internal Medicine

## 2020-03-12 ENCOUNTER — Emergency Department: Payer: Medicare Other

## 2020-03-12 DIAGNOSIS — I5033 Acute on chronic diastolic (congestive) heart failure: Secondary | ICD-10-CM

## 2020-03-12 DIAGNOSIS — A419 Sepsis, unspecified organism: Secondary | ICD-10-CM | POA: Diagnosis present

## 2020-03-12 DIAGNOSIS — D7589 Other specified diseases of blood and blood-forming organs: Secondary | ICD-10-CM | POA: Diagnosis present

## 2020-03-12 DIAGNOSIS — E669 Obesity, unspecified: Secondary | ICD-10-CM | POA: Diagnosis present

## 2020-03-12 DIAGNOSIS — Z9842 Cataract extraction status, left eye: Secondary | ICD-10-CM

## 2020-03-12 DIAGNOSIS — E875 Hyperkalemia: Secondary | ICD-10-CM | POA: Diagnosis present

## 2020-03-12 DIAGNOSIS — G4733 Obstructive sleep apnea (adult) (pediatric): Secondary | ICD-10-CM | POA: Diagnosis present

## 2020-03-12 DIAGNOSIS — K219 Gastro-esophageal reflux disease without esophagitis: Secondary | ICD-10-CM | POA: Diagnosis present

## 2020-03-12 DIAGNOSIS — Z87891 Personal history of nicotine dependence: Secondary | ICD-10-CM | POA: Diagnosis not present

## 2020-03-12 DIAGNOSIS — Z880 Allergy status to penicillin: Secondary | ICD-10-CM | POA: Diagnosis not present

## 2020-03-12 DIAGNOSIS — Z6831 Body mass index (BMI) 31.0-31.9, adult: Secondary | ICD-10-CM | POA: Diagnosis not present

## 2020-03-12 DIAGNOSIS — I248 Other forms of acute ischemic heart disease: Secondary | ICD-10-CM | POA: Diagnosis present

## 2020-03-12 DIAGNOSIS — R0902 Hypoxemia: Secondary | ICD-10-CM | POA: Diagnosis present

## 2020-03-12 DIAGNOSIS — J9601 Acute respiratory failure with hypoxia: Secondary | ICD-10-CM | POA: Diagnosis present

## 2020-03-12 DIAGNOSIS — N179 Acute kidney failure, unspecified: Secondary | ICD-10-CM | POA: Diagnosis present

## 2020-03-12 DIAGNOSIS — N1832 Chronic kidney disease, stage 3b: Secondary | ICD-10-CM | POA: Diagnosis present

## 2020-03-12 DIAGNOSIS — H538 Other visual disturbances: Secondary | ICD-10-CM | POA: Diagnosis present

## 2020-03-12 DIAGNOSIS — Z882 Allergy status to sulfonamides status: Secondary | ICD-10-CM

## 2020-03-12 DIAGNOSIS — Z79899 Other long term (current) drug therapy: Secondary | ICD-10-CM

## 2020-03-12 DIAGNOSIS — F419 Anxiety disorder, unspecified: Secondary | ICD-10-CM | POA: Diagnosis present

## 2020-03-12 DIAGNOSIS — R6520 Severe sepsis without septic shock: Secondary | ICD-10-CM | POA: Diagnosis present

## 2020-03-12 DIAGNOSIS — Z7901 Long term (current) use of anticoagulants: Secondary | ICD-10-CM

## 2020-03-12 DIAGNOSIS — R778 Other specified abnormalities of plasma proteins: Secondary | ICD-10-CM | POA: Diagnosis present

## 2020-03-12 DIAGNOSIS — I482 Chronic atrial fibrillation, unspecified: Secondary | ICD-10-CM | POA: Diagnosis present

## 2020-03-12 DIAGNOSIS — I130 Hypertensive heart and chronic kidney disease with heart failure and stage 1 through stage 4 chronic kidney disease, or unspecified chronic kidney disease: Secondary | ICD-10-CM | POA: Diagnosis present

## 2020-03-12 DIAGNOSIS — J189 Pneumonia, unspecified organism: Secondary | ICD-10-CM | POA: Diagnosis present

## 2020-03-12 DIAGNOSIS — Z888 Allergy status to other drugs, medicaments and biological substances status: Secondary | ICD-10-CM

## 2020-03-12 DIAGNOSIS — I10 Essential (primary) hypertension: Secondary | ICD-10-CM | POA: Diagnosis present

## 2020-03-12 DIAGNOSIS — K449 Diaphragmatic hernia without obstruction or gangrene: Secondary | ICD-10-CM | POA: Diagnosis present

## 2020-03-12 DIAGNOSIS — Z9841 Cataract extraction status, right eye: Secondary | ICD-10-CM

## 2020-03-12 DIAGNOSIS — G9341 Metabolic encephalopathy: Secondary | ICD-10-CM | POA: Diagnosis present

## 2020-03-12 DIAGNOSIS — R509 Fever, unspecified: Secondary | ICD-10-CM

## 2020-03-12 DIAGNOSIS — Z20822 Contact with and (suspected) exposure to covid-19: Secondary | ICD-10-CM | POA: Diagnosis present

## 2020-03-12 DIAGNOSIS — R0602 Shortness of breath: Secondary | ICD-10-CM

## 2020-03-12 DIAGNOSIS — R42 Dizziness and giddiness: Secondary | ICD-10-CM | POA: Diagnosis present

## 2020-03-12 DIAGNOSIS — I351 Nonrheumatic aortic (valve) insufficiency: Secondary | ICD-10-CM | POA: Diagnosis not present

## 2020-03-12 DIAGNOSIS — I4891 Unspecified atrial fibrillation: Secondary | ICD-10-CM | POA: Diagnosis present

## 2020-03-12 DIAGNOSIS — Z91041 Radiographic dye allergy status: Secondary | ICD-10-CM

## 2020-03-12 LAB — URINALYSIS, COMPLETE (UACMP) WITH MICROSCOPIC
Bilirubin Urine: NEGATIVE
Glucose, UA: NEGATIVE mg/dL
Hgb urine dipstick: NEGATIVE
Ketones, ur: NEGATIVE mg/dL
Leukocytes,Ua: NEGATIVE
Nitrite: NEGATIVE
Protein, ur: NEGATIVE mg/dL
Specific Gravity, Urine: 1.013 (ref 1.005–1.030)
pH: 5 (ref 5.0–8.0)

## 2020-03-12 LAB — CBC WITH DIFFERENTIAL/PLATELET
Abs Immature Granulocytes: 0.14 10*3/uL — ABNORMAL HIGH (ref 0.00–0.07)
Basophils Absolute: 0.1 10*3/uL (ref 0.0–0.1)
Basophils Relative: 1 %
Eosinophils Absolute: 0 10*3/uL (ref 0.0–0.5)
Eosinophils Relative: 0 %
HCT: 36.6 % (ref 36.0–46.0)
Hemoglobin: 11.9 g/dL — ABNORMAL LOW (ref 12.0–15.0)
Immature Granulocytes: 1 %
Lymphocytes Relative: 6 %
Lymphs Abs: 0.6 10*3/uL — ABNORMAL LOW (ref 0.7–4.0)
MCH: 33.1 pg (ref 26.0–34.0)
MCHC: 32.5 g/dL (ref 30.0–36.0)
MCV: 101.9 fL — ABNORMAL HIGH (ref 80.0–100.0)
Monocytes Absolute: 0.7 10*3/uL (ref 0.1–1.0)
Monocytes Relative: 7 %
Neutro Abs: 9.3 10*3/uL — ABNORMAL HIGH (ref 1.7–7.7)
Neutrophils Relative %: 85 %
Platelets: 258 10*3/uL (ref 150–400)
RBC: 3.59 MIL/uL — ABNORMAL LOW (ref 3.87–5.11)
RDW: 13.9 % (ref 11.5–15.5)
WBC: 10.8 10*3/uL — ABNORMAL HIGH (ref 4.0–10.5)
nRBC: 0.2 % (ref 0.0–0.2)

## 2020-03-12 LAB — RESPIRATORY PANEL BY RT PCR (FLU A&B, COVID)
Influenza A by PCR: NEGATIVE
Influenza A by PCR: NEGATIVE
Influenza B by PCR: NEGATIVE
Influenza B by PCR: NEGATIVE
SARS Coronavirus 2 by RT PCR: NEGATIVE
SARS Coronavirus 2 by RT PCR: NEGATIVE

## 2020-03-12 LAB — COMPREHENSIVE METABOLIC PANEL
ALT: 25 U/L (ref 0–44)
AST: 25 U/L (ref 15–41)
Albumin: 3.1 g/dL — ABNORMAL LOW (ref 3.5–5.0)
Alkaline Phosphatase: 72 U/L (ref 38–126)
Anion gap: 11 (ref 5–15)
BUN: 38 mg/dL — ABNORMAL HIGH (ref 8–23)
CO2: 23 mmol/L (ref 22–32)
Calcium: 9.4 mg/dL (ref 8.9–10.3)
Chloride: 101 mmol/L (ref 98–111)
Creatinine, Ser: 1.83 mg/dL — ABNORMAL HIGH (ref 0.44–1.00)
GFR, Estimated: 28 mL/min — ABNORMAL LOW (ref >60)
Glucose, Bld: 133 mg/dL — ABNORMAL HIGH (ref 70–99)
Potassium: 5.6 mmol/L — ABNORMAL HIGH (ref 3.5–5.1)
Sodium: 135 mmol/L (ref 135–145)
Total Bilirubin: 1 mg/dL (ref 0.3–1.2)
Total Protein: 6.3 g/dL — ABNORMAL LOW (ref 6.5–8.1)

## 2020-03-12 LAB — PROCALCITONIN: Procalcitonin: 0.13 ng/mL

## 2020-03-12 LAB — PROTIME-INR
INR: 1.7 — ABNORMAL HIGH (ref 0.8–1.2)
Prothrombin Time: 19.5 seconds — ABNORMAL HIGH (ref 11.4–15.2)

## 2020-03-12 LAB — BRAIN NATRIURETIC PEPTIDE: B Natriuretic Peptide: 909.5 pg/mL — ABNORMAL HIGH (ref 0.0–100.0)

## 2020-03-12 LAB — SEDIMENTATION RATE: Sed Rate: 47 mm/hr — ABNORMAL HIGH (ref 0–30)

## 2020-03-12 LAB — TROPONIN I (HIGH SENSITIVITY)
Troponin I (High Sensitivity): 409 ng/L (ref <18)
Troponin I (High Sensitivity): 342 ng/L (ref <18)
Troponin I (High Sensitivity): 309 ng/L (ref <18)

## 2020-03-12 LAB — MRSA PCR SCREENING: MRSA by PCR: NEGATIVE

## 2020-03-12 LAB — APTT: aPTT: 44 seconds — ABNORMAL HIGH (ref 24–36)

## 2020-03-12 LAB — LACTIC ACID, PLASMA
Lactic Acid, Venous: 2.2 mmol/L (ref 0.5–1.9)
Lactic Acid, Venous: 1.6 mmol/L (ref 0.5–1.9)

## 2020-03-12 LAB — HEPARIN LEVEL (UNFRACTIONATED): Heparin Unfractionated: >3.6 IU/mL — ABNORMAL HIGH (ref 0.30–0.70)

## 2020-03-12 LAB — C-REACTIVE PROTEIN: CRP: 10 mg/dL — ABNORMAL HIGH (ref <1.0)

## 2020-03-12 LAB — FIBRIN DERIVATIVES D-DIMER (ARMC ONLY): Fibrin derivatives D-dimer (ARMC): 419.22 ng/mL (FEU) (ref 0.00–499.00)

## 2020-03-12 MED ORDER — ACETAMINOPHEN 650 MG RE SUPP: 650.0000 mg | Freq: Four times a day (QID) | RECTAL | Status: DC | PRN

## 2020-03-12 MED ORDER — ALPRAZOLAM 0.25 MG PO TABS
0.2500 mg | ORAL_TABLET | Freq: Three times a day (TID) | ORAL | Status: DC | PRN
  Administered 2020-03-14 – 2020-03-16 (×3): 0.25 mg via ORAL
  Filled 2020-03-12 (×3): qty 1

## 2020-03-12 MED ORDER — HEPARIN BOLUS VIA INFUSION
4000.0000 [IU] | Freq: Once | INTRAVENOUS | Status: AC
  Administered 2020-03-12: 4000 [IU] via INTRAVENOUS
  Filled 2020-03-12: qty 4000

## 2020-03-12 MED ORDER — SUCRALFATE 1 G PO TABS
1.0000 g | ORAL_TABLET | Freq: Three times a day (TID) | ORAL | Status: DC
  Administered 2020-03-12 – 2020-03-19 (×20): 1 g via ORAL
  Filled 2020-03-12 (×20): qty 1

## 2020-03-12 MED ORDER — FLUOXETINE HCL 20 MG PO CAPS
20.0000 mg | ORAL_CAPSULE | Freq: Every day | ORAL | Status: DC
  Administered 2020-03-13 – 2020-03-19 (×7): 20 mg via ORAL
  Filled 2020-03-12 (×9): qty 1

## 2020-03-12 MED ORDER — ONDANSETRON HCL 4 MG PO TABS: 4.0000 mg | ORAL_TABLET | Freq: Four times a day (QID) | ORAL | Status: DC | PRN

## 2020-03-12 MED ORDER — VANCOMYCIN HCL 2000 MG/400ML IV SOLN
2000.0000 mg | Freq: Once | INTRAVENOUS | Status: AC
  Administered 2020-03-12: 2000 mg via INTRAVENOUS
  Filled 2020-03-12: qty 400

## 2020-03-12 MED ORDER — DOCUSATE SODIUM 100 MG PO CAPS
100.0000 mg | ORAL_CAPSULE | Freq: Two times a day (BID) | ORAL | Status: DC
  Administered 2020-03-12 – 2020-03-19 (×14): 100 mg via ORAL
  Filled 2020-03-12 (×14): qty 1

## 2020-03-12 MED ORDER — ALBUTEROL SULFATE HFA 108 (90 BASE) MCG/ACT IN AERS
1.0000 | INHALATION_SPRAY | Freq: Four times a day (QID) | RESPIRATORY_TRACT | Status: DC | PRN
  Filled 2020-03-12: qty 6.7

## 2020-03-12 MED ORDER — SODIUM CHLORIDE 0.9 % IV BOLUS (SEPSIS)
1000.0000 mL | Freq: Once | INTRAVENOUS | Status: AC
  Administered 2020-03-12: 1000 mL via INTRAVENOUS

## 2020-03-12 MED ORDER — MONTELUKAST SODIUM 10 MG PO TABS
10.0000 mg | ORAL_TABLET | Freq: Every day | ORAL | Status: DC
  Administered 2020-03-12 – 2020-03-18 (×7): 10 mg via ORAL
  Filled 2020-03-12 (×8): qty 1

## 2020-03-12 MED ORDER — PANTOPRAZOLE SODIUM 40 MG PO TBEC
40.0000 mg | DELAYED_RELEASE_TABLET | Freq: Every day | ORAL | Status: DC
  Administered 2020-03-12 – 2020-03-19 (×8): 40 mg via ORAL
  Filled 2020-03-12 (×8): qty 1

## 2020-03-12 MED ORDER — PRAMIPEXOLE DIHYDROCHLORIDE 0.25 MG PO TABS
0.2500 mg | ORAL_TABLET | Freq: Four times a day (QID) | ORAL | Status: DC
  Administered 2020-03-12 – 2020-03-19 (×27): 0.25 mg via ORAL
  Filled 2020-03-12 (×33): qty 1

## 2020-03-12 MED ORDER — ACETAMINOPHEN 325 MG PO TABS
650.0000 mg | ORAL_TABLET | Freq: Four times a day (QID) | ORAL | Status: DC | PRN
  Administered 2020-03-13: 650 mg via ORAL
  Filled 2020-03-12: qty 2

## 2020-03-12 MED ORDER — OXYBUTYNIN CHLORIDE 5 MG PO TABS
5.0000 mg | ORAL_TABLET | Freq: Every evening | ORAL | Status: DC
  Administered 2020-03-12 – 2020-03-18 (×7): 5 mg via ORAL
  Filled 2020-03-12 (×9): qty 1

## 2020-03-12 MED ORDER — CARBAMAZEPINE 100 MG PO CHEW
100.0000 mg | CHEWABLE_TABLET | Freq: Two times a day (BID) | ORAL | Status: DC | PRN
  Filled 2020-03-12: qty 1

## 2020-03-12 MED ORDER — LEVOFLOXACIN IN D5W 750 MG/150ML IV SOLN
750.0000 mg | INTRAVENOUS | Status: AC
  Administered 2020-03-14 – 2020-03-18 (×3): 750 mg via INTRAVENOUS
  Filled 2020-03-12 (×3): qty 150

## 2020-03-12 MED ORDER — HEPARIN (PORCINE) 25000 UT/250ML-% IV SOLN
750.0000 [IU]/h | INTRAVENOUS | Status: DC
  Administered 2020-03-12: 900 [IU]/h via INTRAVENOUS
  Filled 2020-03-12: qty 250

## 2020-03-12 MED ORDER — VANCOMYCIN HCL IN DEXTROSE 1-5 GM/200ML-% IV SOLN: 1000.0000 mg | INTRAVENOUS | Status: DC

## 2020-03-12 MED ORDER — FLECAINIDE ACETATE 100 MG PO TABS
100.0000 mg | ORAL_TABLET | Freq: Two times a day (BID) | ORAL | Status: DC
  Administered 2020-03-12 – 2020-03-19 (×14): 100 mg via ORAL
  Filled 2020-03-12 (×17): qty 1

## 2020-03-12 MED ORDER — TRAZODONE HCL 100 MG PO TABS
100.0000 mg | ORAL_TABLET | Freq: Every day | ORAL | Status: DC
  Administered 2020-03-12 – 2020-03-18 (×7): 100 mg via ORAL
  Filled 2020-03-12 (×7): qty 1

## 2020-03-12 MED ORDER — TRAMADOL HCL 50 MG PO TABS
50.0000 mg | ORAL_TABLET | Freq: Three times a day (TID) | ORAL | Status: DC | PRN
  Administered 2020-03-13 – 2020-03-16 (×7): 50 mg via ORAL
  Filled 2020-03-12 (×7): qty 1

## 2020-03-12 MED ORDER — ONDANSETRON HCL 4 MG/2ML IJ SOLN: 4.0000 mg | Freq: Four times a day (QID) | INTRAMUSCULAR | Status: DC | PRN

## 2020-03-12 MED ORDER — LEVOFLOXACIN IN D5W 750 MG/150ML IV SOLN
750.0000 mg | Freq: Once | INTRAVENOUS | Status: AC
  Administered 2020-03-12: 750 mg via INTRAVENOUS
  Filled 2020-03-12: qty 150

## 2020-03-12 MED ORDER — FUROSEMIDE 10 MG/ML IJ SOLN
40.0000 mg | Freq: Once | INTRAMUSCULAR | Status: AC
  Administered 2020-03-12: 40 mg via INTRAVENOUS
  Filled 2020-03-12: qty 4

## 2020-03-12 MED ORDER — FLUTICASONE PROPIONATE 50 MCG/ACT NA SUSP
2.0000 | Freq: Every evening | NASAL | Status: DC
  Administered 2020-03-13 – 2020-03-18 (×6): 2 via NASAL
  Filled 2020-03-12 (×2): qty 16

## 2020-03-12 NOTE — ED Notes (Signed)
EDP Isaacs at bedside 

## 2020-03-12 NOTE — ED Notes (Signed)
Date and time results received: 03/12/20 "2:38 PM" (use smartphrase ".now" to insert current time)  Test: Lactic Critical Value: 2.2  Name of Provider Notified: Derrill Kay MD  Orders Received? Or Actions Taken?: Orders Received - See Orders for details

## 2020-03-12 NOTE — H&P (Signed)
[facility] History and Physical   Patient: [name] KXF:[id number]   PCP: Danella Penton, MD DOB: [birth year]   DOA: 03/12/2020   DOS: 03/12/2020   DOS: the patient was seen and examined on 03/12/2020  Patient coming from: The patient is coming from Home  Chief Complaint: confusion and fever  HPI: [name] is a 79 y.o. female with Past medical history of chronic A. fib, anxiety, chronic diastolic CHF, GERD, HTN, OSA, obesity. Patient presented with confusion as well as fever with a temperature of 103 at home. Patient had minor dental procedure 2 days ago. Night before last night she had a temperature of 99. She called her dentist and her PCP and was given clindamycin. Her friend who was with her up to 8 PM said the fever came down and the patient was at her baseline. Next morning when she called the patient she found the patient was significantly confused and not making any sense. She went to her house and found that patient had a temperature of 103. EMS was called patient was found severely hypoxic and was brought to the hospital. At the time of my evaluation patient reported some chest heaviness which she feels is consistent with her history of hiatal hernia and does not feel like cardiac chest pain to her. She also has some shortness of breath some dizziness some blurred vision. But denies any tingling or numbness anywhere. No nausea no vomiting. No headache. She is not on any oxygen or CPAP at home but was diagnosed with severe sleep apnea in 2017. She denies any leg pain or leg swelling but missed her Lasix for 2 days.  ED Course: Initially was on nonrebreather. Was later on transition to heated high flow. Better oxygen saturation were found on her earlobes. Patient was referred for admission. Covid test was negative x1. Repeat Covid test is currently pending. Patient admitted as a PUI.   Review of Systems: as mentioned in the history of present illness.  All other systems  reviewed and are negative.  Past Medical History:  Diagnosis Date  . A-fib (HCC) 2012, 2014  . Anxiety   . Arrhythmia    A-fib  . Bursitis    hips  . Chronic diastolic (congestive) heart failure (HCC)   . Concussion   . Concussion July, 28, 2014  . GERD (gastroesophageal reflux disease)   . Hypertension   . Palpitations   . Pneumonia 2012   Past Surgical History:  Procedure Laterality Date  . ABDOMINAL SURGERY  2008   abdominal muscle mesh insert  . Arm surgery  2015   Pin implanted   . CATARACT EXTRACTION Right August 30, 2013  . CATARACT EXTRACTION Left August 09, 2013  . COLONOSCOPY    . ESOPHAGOGASTRODUODENOSCOPY    . HERNIA REPAIR Left 1970  . HERNIA REPAIR  2015   Dr. Malissa Hippo  . KNEE SURGERY Right 1980  . NOSE SURGERY    . SHOULDER SURGERY Right 2000  . TOTAL ABDOMINAL HYSTERECTOMY  1995   Social History:  reports that she has never smoked. She has quit using smokeless tobacco. She reports that she does not drink alcohol and does not use drugs.  Allergies  Allergen Reactions  . Ace Inhibitors Cough  . Albuterol Other (See Comments)    Unknown reaction  . Iodine Hives  . Minocycline Other (See Comments)    Onset 04/10/2001.  / unknown reaction  . Norvasc [Amlodipine Besylate] Hives  . Penicillins  Hives    Did it involve swelling of the face/tongue/throat, SOB, or low BP? NO Did it involve sudden or severe rash/hives, skin peeling, or any reaction on the inside of your mouth or nose? Yes. Pt reports severe hives (blistering) from neck to her stomach Did you need to seek medical attention at a hospital or doctor's office? Unknown When did it last happen?>36 years ago If all above answers are "NO", may proceed with cephalosporin use.  Marland Kitchen Propoxyphene Hives    Onset 04/10/2001.   . Sulfa Antibiotics Diarrhea and Nausea Only  . Tizanidine Other (See Comments)  . Vicodin [Hydrocodone-Acetaminophen] Hives    Hives occur after 5 doses   . Amlodipine Rash  .  Betadine [Povidone Iodine] Rash and Other (See Comments)    blisters  . Clarithromycin Rash    Family history reviewed and not pertinent Family History  Adopted: Yes  Problem Relation Age of Onset  . Dementia Neg Hx        ADOPTED     Prior to Admission medications   Medication Sig Start Date End Date Taking? Authorizing Provider  spironolactone (ALDACTONE) 25 MG tablet Take 25 mg by mouth daily. 09/02/19  Yes [provider]  sucralfate (CARAFATE) 1 g tablet Take 1 g by mouth 3 (three) times daily. 05/07/19  Yes [provider]  albuterol (VENTOLIN HFA) 108 (90 Base) MCG/ACT inhaler Inhale 1-2 puffs into the lungs every 6 (six) hours as needed for wheezing or shortness of breath.    [provider]  ALPRAZolam Prudy Feeler) 0.25 MG tablet Take 0.25 mg by mouth 3 (three) times daily as needed for anxiety.     [provider]  Ascorbic Acid (VITAMIN C) 1000 MG tablet Take 1,000 mg by mouth daily.    [provider]  baclofen (LIORESAL) 10 MG tablet Take 10 mg by mouth 3 (three) times daily as needed for muscle spasms.     [provider]  beta carotene w/minerals (OCUVITE) tablet Take 1 tablet by mouth 2 (two) times daily.     [provider]  Calcium Carb-Cholecalciferol 600-400 MG-UNIT TABS Take 1 tablet by mouth 2 (two) times daily.    [provider]  carbamazepine (TEGRETOL) 100 MG chewable tablet Chew 100 mg by mouth 2 (two) times daily as needed (trigeminal neuralgia).     [provider]  celecoxib (CELEBREX) 100 MG capsule TAKE 1 CAPSULE(100 MG) BY MOUTH TWICE DAILY 09/13/19   [provider]  colchicine 0.6 MG tablet Take 1 tablet (0.6 mg total) by mouth daily as needed (gout flare). 06/19/19   Enedina Finner, MD  diclofenac sodium (VOLTAREN) 1 % GEL Apply 1 application topically 2 (two) times daily as needed (foot pain).     [provider]  ELIQUIS 5 MG TABS tablet TAKE 1 TABLET(5 MG) BY  MOUTH TWICE DAILY Patient taking differently: Take 5 mg by mouth 2 (two) times daily.  01/31/20   Antonieta Iba, MD  esomeprazole (NEXIUM) 20 MG capsule Take 20 mg by mouth daily before breakfast.     [provider]  estradiol (ESTRACE) 1 MG tablet Take 1.5 mg by mouth daily.     [provider]  flecainide (TAMBOCOR) 100 MG tablet TAKE 1 TABLET BY MOUTH TWICE DAILY Patient taking differently: Take 100 mg by mouth 2 (two) times daily.  05/06/19   Antonieta Iba, MD  FLUoxetine (PROZAC) 20 MG capsule Take 20 mg by mouth daily.  [provider]  fluticasone (FLONASE) 50 MCG/ACT nasal spray Place 2 sprays into both nostrils every evening.     [provider]  furosemide (LASIX) 40 MG tablet Take 0.5 tablets (20 mg total) by mouth daily. 06/22/19   Enedina FinnerPatel, Sona, MD  guaiFENesin-dextromethorphan [facility](ROBITUSSIN DM) 100-10 MG/5ML syrup Take 5 mLs by mouth every 8 (eight) hours as needed for cough.    [provider]  hydrALAZINE (APRESOLINE) 25 MG tablet Take 2 tablets (50 mg total) by mouth 3 (three) times daily as needed. 09/24/19   Antonieta IbaGollan, Timothy J, MD  metoprolol tartrate (LOPRESSOR) 25 MG tablet Take 0.5 tablets (12.5 mg total) by mouth in the morning and at bedtime. 09/24/19   Antonieta IbaGollan, Timothy J, MD  montelukast (SINGULAIR) 10 MG tablet TAKE 1 TABLET(10 MG) BY MOUTH AT BEDTIME Patient taking differently: Take 10 mg by mouth at bedtime.  11/21/16   Shane Crutchamachandran, Pradeep, MD  Multiple Vitamin (MULTIVITAMIN WITH MINERALS) TABS tablet Take 1 tablet by mouth daily with supper.    [provider]  ondansetron (ZOFRAN) 4 MG tablet Take 4 mg by mouth every 6 (six) hours as needed for nausea or vomiting.    [provider]  oxybutynin (DITROPAN) 5 MG tablet Take 5 mg by mouth every evening.  10/01/14   [provider]  pramipexole (MIRAPEX) 0.5 MG tablet Take 0.25 mg by mouth 4 (four) times daily.     [provider]  pyridoxine  (B-6) 200 MG tablet Take 200 mg by mouth daily.    [provider]  SUMAtriptan (IMITREX) 50 MG tablet Take 50 mg by mouth daily as needed for migraine.     [provider]  tiZANidine (ZANAFLEX) 4 MG tablet Take 2 mg by mouth 3 (three) times daily as needed for muscle spasms. 01/25/19   [provider]  traMADol (ULTRAM) 50 MG tablet Take 50 mg by mouth 3 (three) times daily as needed for moderate pain.     [provider]  traZODone (DESYREL) 50 MG tablet Take 100 mg by mouth at bedtime.     [provider]  triamcinolone lotion (KENALOG) 0.1 % Apply 1 application topically 3 (three) times daily as needed (itching/rash).    [provider]  verapamil (VERELAN PM) 180 MG 24 hr capsule Take 180 mg by mouth in the morning and at bedtime.  09/11/19 09/10/20  [provider]  vitamin B-12 (CYANOCOBALAMIN) 1000 MCG tablet Take 1,000 mcg by mouth at bedtime.     [provider]    Physical Exam: Vitals:   03/12/20 1815 03/12/20 1830 03/12/20 1831 03/12/20 1845  BP: 112/63 93/63  108/61  Pulse: 74 68  68  Resp: (!) 27 18  (!) 24  Temp:      TempSrc:      SpO2: 92% 92% 94% 94%  Weight:      Height:        General: alert and oriented to time, place, and person. Appear in mild distress, affect appropriate Eyes: PERRL, Conjunctiva normal ENT: Oral Mucosa Clear, moist  Neck: no JVD, no Abnormal Mass Or lumps Cardiovascular: S1 and S2 Present, no Murmur, peripheral pulses symmetrical Respiratory: increased respiratory effort, Bilateral Air entry equal and Decreased, no signs of accessory muscle use, bilateral  Crackles, no wheezes Abdomen: Bowel Sound present, Soft and no tenderness, no hernia Skin: no rashes  Extremities: bilateral  Pedal edema, no calf tenderness Neurologic: without any new focal findings Gait not checked due  to patient safety concerns  Data Reviewed: I have personally reviewed and interpreted labs, imaging  as discussed below.  CBC: Recent Labs  Lab 03/12/20 1351  WBC 10.8*  NEUTROABS 9.3*  HGB 11.9*  HCT 36.6  MCV 101.9*  PLT 258   Basic Metabolic Panel: Recent Labs  Lab 03/12/20 1351  NA 135  K 5.6*  CL 101  CO2 23  GLUCOSE 133*  BUN 38*  CREATININE 1.83*  CALCIUM 9.4   GFR: Estimated Creatinine Clearance: 28.9 mL/min (A) (by C-G formula based on SCr of 1.83 mg/dL (H)). Liver Function Tests: Recent Labs  Lab 03/12/20 1351  AST 25  ALT 25  ALKPHOS 72  BILITOT 1.0  PROT 6.3*  ALBUMIN 3.1*   No results for input(s): LIPASE, AMYLASE in the last 168 hours. No results for input(s): AMMONIA in the last 168 hours. Coagulation Profile: Recent Labs  Lab 03/12/20 1351  INR 1.7*   Cardiac Enzymes: No results for input(s): CKTOTAL, CKMB, CKMBINDEX, TROPONINI in the last 168 hours. BNP (last 3 results) No results for input(s): PROBNP in the last 8760 hours. HbA1C: No results for input(s): HGBA1C in the last 72 hours. CBG: No results for input(s): GLUCAP in the last 168 hours. Lipid Profile: No results for input(s): CHOL, HDL, LDLCALC, TRIG, CHOLHDL, LDLDIRECT in the last 72 hours. Thyroid Function Tests: No results for input(s): TSH, T4TOTAL, FREET4, T3FREE, THYROIDAB in the last 72 hours. Anemia Panel: No results for input(s): VITAMINB12, FOLATE, FERRITIN, TIBC, IRON, RETICCTPCT in the last 72 hours. Urine analysis:    Component Value Date/Time   COLORURINE YELLOW (A) 02/03/2019 1202   APPEARANCEUR CLEAR (A) 02/03/2019 1202   APPEARANCEUR Clear 04/24/2013 0749   LABSPEC 1.012 02/03/2019 1202   LABSPEC 1.006 04/24/2013 0749   PHURINE 5.0 02/03/2019 1202   GLUCOSEU NEGATIVE 02/03/2019 1202   GLUCOSEU Negative 04/24/2013 0749   HGBUR NEGATIVE 02/03/2019 1202   BILIRUBINUR NEGATIVE 02/03/2019 1202   BILIRUBINUR Negative 04/24/2013 0749   KETONESUR NEGATIVE 02/03/2019 1202   PROTEINUR NEGATIVE 02/03/2019 1202   UROBILINOGEN 0.2 08/20/2010 0521   NITRITE  NEGATIVE 02/03/2019 1202   LEUKOCYTESUR NEGATIVE 02/03/2019 1202   LEUKOCYTESUR Negative 04/24/2013 0749    Radiological Exams on Admission: DG Chest Port 1 View  Result Date: 03/12/2020 CLINICAL DATA:  Fever EXAM: PORTABLE CHEST 1 VIEW COMPARISON:  02/04/2019 FINDINGS: Stable cardiomegaly. Atherosclerotic calcification of the aortic knob. Large hiatal hernia. Diffusely increased interstitial markings throughout both lungs. No focal airspace consolidation. No appreciable pleural fluid collection. No pneumothorax. IMPRESSION: Diffusely increased interstitial markings throughout both lungs which may reflect edema versus atypical/viral infection. Electronically Signed   By: Duanne Guess D.O.   On: 03/12/2020 14:34   EKG: Independently reviewed. atrial fibrillation, rate controlled. Echocardiogram: Ordered.  I reviewed all nursing notes, pharmacy notes, vitals, pertinent old records.  Assessment/Plan 1. Acute respiratory failure with hypoxia (HCC) Sepsis POA. Meeting SIRS criteria with tachycardia, leukocytosis with organ damage and elevated troponin. Likely from community-acquired pneumonia. Potential for acute on chronic diastolic CHF with flash pulmonary edema cannot be ruled out. Elevated troponin  Currently while the patient has severe hypoxemia she appears to be comfortable and is able to talk in full sentences. I will treat her with IV vancomycin and IV Levaquin. Follow-up on cultures. BNP elevated. We will give x1 IV Lasix. If renal function improves with diuretic will recommend continuing Lasix going tomorrow. Troponin mildly elevated likely demand ischemia. Will follow serial troponin. Patient already on Eliquis which  I will transition to heparin for now.  2. Chronic  Atrial fibrillation (HCC) Continue home regimen.    GERD (gastroesophageal reflux disease) Continue PPI.    AKI (acute kidney injury) (HCC) Likely cardiorenal hemodynamics. Patient missed her 2 doses of  Lasix. We will monitor response to IV diuresis.    Elevated troponin Follow serial troponin. Likely demand ischemia. No evidence of ST elevation MI. If the troponin continues to trend up though we will consult cardiology. Echocardiogram ordered.    Hyperkalemia Mild. Anticipating improvement with diuresis.    Macrocytosis Monitor for now. Check B12.    Acute metabolic encephalopathy In the setting of severe hypoxemia. Patient back to baseline per family.  Goals of care conversation. Patient wants to remain full code. Patient has a healthcare power of attorney. Does not want to have a feeding tube. Patient does not want to remain on life support should she does not have a good quality of life but is dependent on machine to survive.  Nutrition: Cardiac diet DVT Prophylaxis: Therapeutic Anticoagulation with heparin  Advance goals of care discussion: Full code   Consults: none   Family Communication: no family was present at bedside, at the time of interview.   Disposition:  From: Home Likely will need Home on discharge.   Author: Lynden Oxford, MD [facility] 03/12/2020 7:09 PM   To reach On-call, see care teams to locate the attending and reach out to them via [email or web address]. If 7PM-7AM, please contact night-coverage If you still have difficulty reaching the attending provider, please page the [facility] (Director on Call) for [facility] on amion for assistance.

## 2020-03-12 NOTE — ED Notes (Signed)
Pt called out for feeling flushed and lightheaded. Repeat vitals normal. Repeat EKG given to EDMD Issacs. Notified Manuela Schwartz NP. No orders at this time.

## 2020-03-12 NOTE — ED Triage Notes (Addendum)
Pt comes via EMS from home with sepsis alert. Pt had recent dental procedure completed and spiked a fever following that. Pt reported temp 102-103 today and took tylenol with relief. Pt is A&Ox4 Pt on non-rebreather and O2 increased to 96-97%

## 2020-03-12 NOTE — ED Notes (Signed)
Pharmacy called and will verify medications for pt.
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
## 2020-03-12 NOTE — ED Notes (Signed)
EDP Goodman at bedside.  

## 2020-03-12 NOTE — ED Notes (Signed)
Pt given meal tray at this time and drink

## 2020-03-12 NOTE — Progress Notes (Signed)
Pharmacy Antibiotic Note  [name] is a 79 y.o. female admitted on 03/12/2020 with CAP. Patient presented with concern for fever. Patient had a dental procedure 2 days ago and admits to forgetting to taker her antibiotic until after the procedure. 10/21 CXR concerning for atypical/viral infection versus edema. WBC 10.8, lactic acid 2.2, procal 0.13, Scr 1.83. Pharmacy has been consulted for Vancomycin and Levofloxacin dosing.  Patient reports allergy to PCNs. Patient reported that she got severe hives that blistered from her neck down to her stomach.   Plan: Levofloxacin 750mg  IV q48 hours  Will give loading dose of Vancomycin 2g IV x1 followed by vancomycin 1000mg  IV q24 hours per dosing nomogram --Obtain vanc level prior to 4th or 5th dose  Monitor renal function and adjust doses as clinically indicated   Height: 5\' 6"  (167.6 cm) Weight: 88.5 kg (195 lb 1.7 oz) IBW/kg (Calculated) : 59.3  Temp (24hrs), Avg:97.7 F (36.5 C), Min:97.7 F (36.5 C), Max:97.7 F (36.5 C)  Recent Labs  Lab 03/12/20 1351  WBC 10.8*  CREATININE 1.83*  LATICACIDVEN 2.2*    Estimated Creatinine Clearance: 28.9 mL/min (A) (by C-G formula based on SCr of 1.83 mg/dL (H)).    Allergies  Allergen Reactions  . Ace Inhibitors Cough  . Albuterol Other (See Comments)    Unknown reaction  . Iodine Hives  . Minocycline Other (See Comments)    Onset 04/10/2001.  / unknown reaction  . Norvasc [Amlodipine Besylate] Hives  . Penicillins Hives    Did it involve swelling of the face/tongue/throat, SOB, or low BP? Unknown Did it involve sudden or severe rash/hives, skin peeling, or any reaction on the inside of your mouth or nose? Unknown Did you need to seek medical attention at a hospital or doctor's office? Unknown When did it last happen?>36 years ago If all above answers are "NO", may proceed with cephalosporin use.  Propoxyphene Hives    Onset 04/10/2001.   . Sulfa Antibiotics Diarrhea and  Nausea Only  . Tizanidine Other (See Comments)  . Vicodin [Hydrocodone-Acetaminophen] Hives    Hives occur after 5 doses   . Amlodipine Rash  . Betadine [Povidone Iodine] Rash and Other (See Comments)    blisters  . Clarithromycin Rash    Antimicrobials this admission: 10/21 Levofloxacin >>  10/21 Vancomycin >>    Microbiology results: 10/21 BCx: pending 10/21 Sputum: pending 10/21 MRSA PCR: pending  Thank you for allowing pharmacy to be a part of this patient's care.  11/21, PharmD Pharmacy Resident  03/12/2020 4:36 PM

## 2020-03-12 NOTE — ED Notes (Signed)
Date and time results received: 03/12/20 "6:24 PM" (use smartphrase ".now" to insert current time)  Test: Troponin Critical Value: 409  Name of Provider Notified: Allena Katz MD  Orders Received? Or Actions Taken?: Orders Received - See Orders for details

## 2020-03-12 NOTE — ED Provider Notes (Addendum)
Admit to medicine. Febrile with CXR c/f atypical PNA. COVID negative, abx started empirically.   Shaune Pollack, MD 03/12/20 Ottis Stain    Shaune Pollack, MD 03/12/20 2023

## 2020-03-12 NOTE — Progress Notes (Signed)
Received Fax from [facility], it seems to be missing infornmation. Will need to call the pharmacy again in the morning to see if they could fax over the medication list over again. Unable to complete at this time.

## 2020-03-12 NOTE — ED Notes (Signed)
Hematoma present resulting from IV attempt in R wrist at this time

## 2020-03-12 NOTE — Progress Notes (Signed)
PHARMACY -  BRIEF ANTIBIOTIC NOTE   Pharmacy has received consult(s) for Levofloxacin from an ED provider.  The patient's profile has been reviewed for ht/wt/allergies/indication/available labs.    One time order(s) placed for Levofloxacin 750mg  IV  Further antibiotics/pharmacy consults should be ordered by admitting physician if indicated.                       Thank you, , PharmD Pharmacy Resident  03/12/2020 4:07 PM

## 2020-03-12 NOTE — ED Notes (Signed)
Date and time results received: 03/12/20 "6:17 PM" (use smartphrase ".now" to insert current time)  Test: Troponin Critical Value: 409  Name of Provider Notified: Allena Katz MD  Orders Received? Or Actions Taken?: Orders Received - See Orders for details

## 2020-03-12 NOTE — Progress Notes (Addendum)
ANTICOAGULATION CONSULT NOTE  Pharmacy Consult for Heparin infusion Indication: ACS/STEMI  Allergies  Allergen Reactions  . Ace Inhibitors Cough  . Albuterol Other (See Comments)    Unknown reaction  . Iodine Hives  . Minocycline Other (See Comments)    Onset 04/10/2001.  / unknown reaction  . Norvasc [Amlodipine Besylate] Hives  . Penicillins Hives    Did it involve swelling of the face/tongue/throat, SOB, or low BP? NO Did it involve sudden or severe rash/hives, skin peeling, or any reaction on the inside of your mouth or nose? Yes. Pt reports severe hives (blistering) from neck to her stomach Did you need to seek medical attention at a hospital or doctor's office? Unknown When did it last happen?>36 years ago If all above answers are "NO", may proceed with cephalosporin use.  Marland Kitchen Propoxyphene Hives    Onset 04/10/2001.   . Sulfa Antibiotics Diarrhea and Nausea Only  . Tizanidine Other (See Comments)  . Vicodin [Hydrocodone-Acetaminophen] Hives    Hives occur after 5 doses   . Amlodipine Rash  . Betadine [Povidone Iodine] Rash and Other (See Comments)    blisters  . Clarithromycin Rash    Patient Measurements: Height: 5\' 6"  (167.6 cm) Weight: 88.5 kg (195 lb 1.7 oz) IBW/kg (Calculated) : 59.3 Heparin Dosing Weight: 78.4 kg  Vital Signs: Temp: 97.7 F (36.5 C) (10/21 1345) Temp Source: Oral (10/21 1345) BP: 108/61 (10/21 1845) Pulse Rate: 68 (10/21 1845)  Labs: Recent Labs    03/12/20 1351 03/12/20 1659  HGB 11.9*  --   HCT 36.6  --   PLT 258  --   APTT 44*  --   LABPROT 19.5*  --   INR 1.7*  --   CREATININE 1.83*  --   TROPONINIHS  --  409*    Estimated Creatinine Clearance: 28.9 mL/min (A) (by C-G formula based on SCr of 1.83 mg/dL (H)).   Medical History: Past Medical History:  Diagnosis Date  . A-fib (HCC) 2012, 2014  . Anxiety   . Arrhythmia    A-fib  . Bursitis    hips  . Chronic diastolic (congestive) heart failure (HCC)   . Concussion    . Concussion July, 28, 2014  . GERD (gastroesophageal reflux disease)   . Hypertension   . Palpitations   . Pneumonia 2012    Medications:  PTA Apixaban 5mg  BID - last dose 10/21 around 10am  Assessment: 79yo female with PMH for chronic afib (on Eliquis), anxiety, chronic diastolic CHF, GERD, HTN, and obesity. Trop HS 409, BNP 909.5. Pharmacy has been consulted for heparin dosing and monitoring.   Baseline labs: aPTT 44, PT 19.5, INR 1.7, Hgb 11.9 (seems to be baseline), Plt 258; baseline HL pending  Goal of Therapy:  Heparin level 0.3-0.7 units/ml once aPTT and HL begin to correlate aPTT 66-102 seconds Monitor platelets by anticoagulation protocol: Yes   Plan:  Give 4000 units bolus x 1 Start heparin infusion at 900 units/hr Check aPTT in 8 hours and anti-Xa level with AM labs  Continue to monitor H&H and platelets  Ordered add on heparin level for baseline  11/21, PharmD Pharmacy Resident  03/12/2020 7:02 PM

## 2020-03-13 ENCOUNTER — Inpatient Hospital Stay (HOSPITAL_COMMUNITY)
Admit: 2020-03-13 | Discharge: 2020-03-13 | Disposition: A | Discharge status: Home / Self Care | Payer: Medicare Other | Attending: Internal Medicine | Admitting: Internal Medicine

## 2020-03-13 ENCOUNTER — Encounter: Payer: Self-pay | Admitting: Internal Medicine

## 2020-03-13 DIAGNOSIS — I351 Nonrheumatic aortic (valve) insufficiency: Secondary | ICD-10-CM | POA: Diagnosis not present

## 2020-03-13 LAB — CBC
HCT: 34.8 % — ABNORMAL LOW (ref 36.0–46.0)
Hemoglobin: 11.3 g/dL — ABNORMAL LOW (ref 12.0–15.0)
MCH: 33.1 pg (ref 26.0–34.0)
MCHC: 32.5 g/dL (ref 30.0–36.0)
MCV: 102.1 fL — ABNORMAL HIGH (ref 80.0–100.0)
Platelets: 216 10*3/uL (ref 150–400)
RBC: 3.41 MIL/uL — ABNORMAL LOW (ref 3.87–5.11)
RDW: 14 % (ref 11.5–15.5)
WBC: 8.7 10*3/uL (ref 4.0–10.5)
nRBC: 0 % (ref 0.0–0.2)

## 2020-03-13 LAB — ECHOCARDIOGRAM COMPLETE
AR max vel: 2.68 cm2
AV Area VTI: 3.34 cm2
AV Area mean vel: 2.95 cm2
AV Mean grad: 4 mmHg
AV Peak grad: 7.9 mmHg
Ao pk vel: 1.41 m/s
Area-P 1/2: 3.89 cm2
Height: 66 in
S' Lateral: 2.8 cm
Weight: 3121.71 oz

## 2020-03-13 LAB — APTT
aPTT: 116 seconds — ABNORMAL HIGH (ref 24–36)
aPTT: 64 seconds — ABNORMAL HIGH (ref 24–36)

## 2020-03-13 LAB — BASIC METABOLIC PANEL
Anion gap: 10 (ref 5–15)
BUN: 38 mg/dL — ABNORMAL HIGH (ref 8–23)
CO2: 24 mmol/L (ref 22–32)
Calcium: 9 mg/dL (ref 8.9–10.3)
Chloride: 102 mmol/L (ref 98–111)
Creatinine, Ser: 1.64 mg/dL — ABNORMAL HIGH (ref 0.44–1.00)
GFR, Estimated: 32 mL/min — ABNORMAL LOW (ref >60)
Glucose, Bld: 106 mg/dL — ABNORMAL HIGH (ref 70–99)
Potassium: 4.6 mmol/L (ref 3.5–5.1)
Sodium: 136 mmol/L (ref 135–145)

## 2020-03-13 LAB — HEPARIN LEVEL (UNFRACTIONATED): Heparin Unfractionated: 2.96 IU/mL — ABNORMAL HIGH (ref 0.30–0.70)

## 2020-03-13 LAB — MAGNESIUM: Magnesium: 2.1 mg/dL (ref 1.7–2.4)

## 2020-03-13 LAB — STREP PNEUMONIAE URINARY ANTIGEN: Strep Pneumo Urinary Antigen: NEGATIVE

## 2020-03-13 MED ORDER — TIZANIDINE HCL 4 MG PO TABS
4.0000 mg | ORAL_TABLET | Freq: Three times a day (TID) | ORAL | Status: DC | PRN
  Administered 2020-03-13 – 2020-03-17 (×8): 4 mg via ORAL
  Filled 2020-03-13 (×9): qty 1

## 2020-03-13 MED ORDER — APIXABAN 5 MG PO TABS
5.0000 mg | ORAL_TABLET | Freq: Two times a day (BID) | ORAL | Status: DC
  Administered 2020-03-13 – 2020-03-19 (×12): 5 mg via ORAL
  Filled 2020-03-13 (×12): qty 1

## 2020-03-13 MED ORDER — SODIUM CHLORIDE 0.9% FLUSH
10.0000 mL | Freq: Two times a day (BID) | INTRAVENOUS | Status: DC
  Administered 2020-03-13 – 2020-03-19 (×13): 10 mL via INTRAVENOUS

## 2020-03-13 MED ORDER — FUROSEMIDE 10 MG/ML IJ SOLN
40.0000 mg | Freq: Every day | INTRAMUSCULAR | Status: DC
  Administered 2020-03-13 – 2020-03-14 (×2): 40 mg via INTRAVENOUS
  Filled 2020-03-13 (×2): qty 4

## 2020-03-13 NOTE — Progress Notes (Signed)
ANTICOAGULATION CONSULT NOTE  Pharmacy Consult for Apixaban Indication: Afib  Allergies  Allergen Reactions  . Penicillins Hives    Did it involve swelling of the face/tongue/throat, SOB, or low BP? NO Did it involve sudden or severe rash/hives, skin peeling, or any reaction on the inside of your mouth or nose? Yes. Pt reports severe hives (blistering) from neck to her stomach Did you need to seek medical attention at a hospital or doctor's office? Unknown When did it last happen?>36 years ago If all above answers are "NO", may proceed with cephalosporin use.  . Amlodipine Rash  . Clarithromycin Rash  . Minocycline Other (See Comments)    Onset 04/10/2001.  / unknown reaction  . Propoxyphene Hives    Onset 04/10/2001.   . Ace Inhibitors Cough  . Norvasc [Amlodipine Besylate] Hives  . Vicodin [Hydrocodone-Acetaminophen] Hives    Hives occur after 5 doses   . Betadine [Povidone Iodine] Rash and Other (See Comments)    blisters  . Iodine Rash  . Sulfa Antibiotics Diarrhea and Nausea Only    Patient Measurements: Height: 5\' 6"  (167.6 cm) Weight: 88.5 kg (195 lb 1.7 oz) IBW/kg (Calculated) : 59.3 Heparin Dosing Weight: 78.4 kg  Vital Signs: Temp: 98.6 F (37 C) (10/22 0749) Temp Source: Oral (10/22 0749) BP: 124/65 (10/22 1300) Pulse Rate: 102 (10/22 1345)  Labs: Recent Labs    03/12/20 1351 03/12/20 1659 03/12/20 1850 03/12/20 2053 03/13/20 0358 03/13/20 0751 03/13/20 1503  HGB 11.9*  --   --   --  11.3*  --   --   HCT 36.6  --   --   --  34.8*  --   --   PLT 258  --   --   --  216  --   --   APTT 44*  --   --   --  116*  --  64*  LABPROT 19.5*  --   --   --   --   --   --   INR 1.7*  --   --   --   --   --   --   HEPARINUNFRC  --  >3.60*  --   --  2.96*  --   --   CREATININE 1.83*  --   --   --   --  1.64*  --   TROPONINIHS  --  409* 342* 309*  --   --   --     Estimated Creatinine Clearance: 32.2 mL/min (A) (by C-G formula based on SCr of 1.64 mg/dL  (H)).   Medical History: Past Medical History:  Diagnosis Date  . A-fib (HCC) 2012, 2014  . Anxiety   . Arrhythmia    A-fib  . Bursitis    hips  . Chronic diastolic (congestive) heart failure (HCC)   . Concussion   . Concussion July, 28, 2014  . GERD (gastroesophageal reflux disease)   . Hypertension   . Palpitations   . Pneumonia 2012    Medications:  PTA Apixaban 5mg  BID - last dose 10/21 around 10am  Assessment: 79yo female with PMH for chronic afib (on Eliquis), anxiety, chronic diastolic CHF, GERD, HTN, and obesity. Trop HS 409, BNP 909.5. Patient was previously on heparin drip for possible ACS/STEMI.  Pharmacy has been consulted for Apixaban dosing for Afib.  Baseline labs: aPTT 44, PT 19.5, INR 1.7, Hgb 11.9 (seems to be baseline), Plt 258  Goal of Therapy:  Monitor platelets by anticoagulation protocol:  Yes   Plan:  Restart home dose Apixaban 5mg  BID Monitor CBC per protocol  , PharmD Pharmacy Resident  03/13/2020 3:31 PM

## 2020-03-13 NOTE — ED Notes (Signed)
Patient wet bed, changed bed linens, new brief and clothing changed.

## 2020-03-13 NOTE — Progress Notes (Signed)
OT Cancellation Note  Patient Details Name: [name] MRN: [id number] DOB: 06/11/1940   Cancelled Treatment:    Reason Eval/Treat Not Completed: Patient not medically ready. Per chart review pt receiving heparin for ACS, pending cardiology consult. Will hold OT evaluation until further workup completed and initiate services at alter date/time as able.   Kathie Dike, M.S. OTR/L  03/13/20, 9:02 AM  ascom [phone]

## 2020-03-13 NOTE — Progress Notes (Signed)
*  PRELIMINARY RESULTS* Echocardiogram 2D Echocardiogram has been performed.  Cristela Blue 03/13/2020, 9:28 AM

## 2020-03-13 NOTE — ED Notes (Signed)
Pt given breakfast.

## 2020-03-13 NOTE — Progress Notes (Signed)
[facility] Progress Note  Patient: [name]    CHY:[id number]  DOA: 03/12/2020     Date of Service: the patient was seen and examined on 03/13/2020  Brief hospital course: [name] is a 79 y.o. female with Past medical history of chronic A. fib, anxiety, chronic diastolic CHF, GERD, HTN, OSA, obesity.  Presents with severe hypoxia requiring heated high flow nasal cannula for now.  Likely from community-acquired pneumonia as well as CHF. Currently plan is continue IV Lasix and IV antibiotics.  Assessment and Plan: Acute respiratory failure with hypoxia (HCC), POA Sepsis POA. Meeting SIRS criteria with tachycardia, leukocytosis with organ damage and elevated troponin. Likely from community-acquired pneumonia. Potential for acute on chronic diastolic CHF with flash pulmonary edema cannot be ruled out. 75% on room air on admission, currently still occasionally drops to 89% on heated high flow. comfortable and is able to talk in full sentences. COVID-19 test x2 -. MRSA PCR negative therefore antibiotic switch only to Levaquin. Follow-up on cultures. BNP elevated.  Continue IV Lasix Continue with IV antibiotics.  Follow-up on cultures.  Chronic  Atrial fibrillation (HCC) Elevated troponin Continue home regimen. Briefly was on IV heparin. Troponin less likely consistent with ACS. More likely demand ischemia. Echocardiogram also shows preserved EF without any wall motion abnormality. Back on Eliquis.    GERD (gastroesophageal reflux disease) Continue PPI.    AKI (acute kidney injury) on chronic kidney disease stage IIIb (HCC) Baseline renal function 1.7 On presentation serum creatinine 1.8.  Currently trending up to 1.6. Likely cardiorenal hemodynamics. Patient missed her 2 doses of Lasix. We will monitor response to IV diuresis.    Hyperkalemia Mild.  Resolved improvement with diuresis.    Macrocytosis Monitor for now. B12 normal.    Acute metabolic  encephalopathy In the setting of severe hypoxemia. Patient back to baseline per family.  Goals of care conversation. Patient wants to remain full code. Patient has a healthcare power of attorney. Does not want to have a feeding tube. Patient does not want to remain on life support should she does not have a good quality of life but is dependent on machine to survive.  Body mass index is 31.49 kg/m.   Diet: Cardiac diet DVT Prophylaxis:    apixaban (ELIQUIS) tablet 5 mg    Advance goals of care discussion: Full code  Family Communication: family was present at bedside, at the time of interview.  The pt provided permission to discuss medical plan with the family. Opportunity was given to ask question and all questions were answered satisfactorily.   Disposition:  Status is: Inpatient  Remains inpatient appropriate because:Hemodynamically unstable   Dispo: The patient is from: Home              Anticipated d/c is to: Home              Anticipated d/c date is: > 3 days              Patient currently is not medically stable to d/c.  Subjective: Denies any acute complaint.  Continues to have shortness of breath.  No dizziness no blurriness no headache no chest pain.  No nausea no vomiting.  Oral intake adequate.  No diarrhea.  No further fever.  Physical Exam:  General: Appear in mild distress, no Rash; Oral Mucosa Clear, moist. no Abnormal Neck Mass Or lumps, Conjunctiva normal  Cardiovascular: S1 and S2 Present, no Murmur, Respiratory: good respiratory effort, Bilateral Air entry present  and bilateral  Crackles, no wheezes Abdomen: Bowel Sound present, Soft and no tenderness Extremities: bilateral  Pedal edema Neurology: alert and oriented to time, place, and person affect appropriate. no new focal deficit Gait not checked due to patient safety concerns  Vitals:   03/13/20 1330 03/13/20 1345 03/13/20 1500 03/13/20 1503  BP:   109/60   Pulse: 81 (!) 102 82   Resp: (!)  22 (!) 21 18   Temp:   99.4 F (37.4 C)   TempSrc:   Oral   SpO2: 96% 91% 92% 92%  Weight:      Height:        Intake/Output Summary (Last 24 hours) at 03/13/2020 1840 Last data filed at 03/13/2020 1800 Gross per 24 hour  Intake 400 ml  Output 700 ml  Net -300 ml   Filed Weights   03/12/20 1346  Weight: 88.5 kg    Data Reviewed: I have personally reviewed and interpreted daily labs, tele strips, imagings as discussed above. I reviewed all nursing notes, pharmacy notes, vitals, pertinent old records I have discussed plan of care as described above with RN and patient/family.  CBC: Recent Labs  Lab 03/12/20 1351 03/13/20 0358  WBC 10.8* 8.7  NEUTROABS 9.3*  --   HGB 11.9* 11.3*  HCT 36.6 34.8*  MCV 101.9* 102.1*  PLT 258 216   Basic Metabolic Panel: Recent Labs  Lab 03/12/20 1351 03/13/20 0751  NA 135 136  K 5.6* 4.6  CL 101 102  CO2 23 24  GLUCOSE 133* 106*  BUN 38* 38*  CREATININE 1.83* 1.64*  CALCIUM 9.4 9.0  MG  --  2.1    Studies: ECHOCARDIOGRAM COMPLETE  Result Date: 03/13/2020    ECHOCARDIOGRAM REPORT   Patient Name:   [name] Date of Exam: 03/13/2020 Medical Rec #:  [id number]   Height:       66.0 in Accession #:    [id number]  Weight:       195.1 lb Date of Birth:  09-06-1940   BSA:          1.979 m Patient Age:    79 years    BP:           130/67 mmHg Patient Gender: F           HR:           81 bpm. Exam Location:  [facility] Procedure: 2D Echo, Cardiac Doppler and Color Doppler Indications:     Elevated Troponin  History:         Patient has prior history of Echocardiogram examinations, most                  recent 06/14/2016. Arrythmias:Atrial Fibrillation; Risk                  Factors:Hypertension. Palpitations.  Sonographer:     Cristela Blue RDCS (AE) Referring Phys:  [id number] [facility] M Vertis Bauder Diagnosing Phys: Cristal Deer End MD IMPRESSIONS  1. Left ventricular ejection fraction, by estimation, is 65 to 70%. The left ventricle has normal function. The  left ventricle has no regional wall motion abnormalities. There is moderate asymmetric left ventricular hypertrophy of the septal segment. Left ventricular diastolic parameters are consistent with Grade II diastolic dysfunction (pseudonormalization). Elevated left atrial pressure.  2. Right ventricular systolic function is normal. The right ventricular size is normal. Mildly increased right ventricular wall thickness. Tricuspid regurgitation signal is inadequate for assessing PA pressure.  3. Left  atrial size was moderately dilated.  4. Right atrial size was mildly dilated.  5. The mitral valve is degenerative. Trivial mitral valve regurgitation. No evidence of mitral stenosis.  6. The aortic valve is tricuspid. There is mild calcification of the aortic valve. There is mild thickening of the aortic valve. Aortic valve regurgitation is mild to moderate. Mild to moderate aortic valve sclerosis/calcification is present, without any evidence of aortic stenosis.  7. The inferior vena cava is normal in size with <50% respiratory variability, suggesting right atrial pressure of 8 mmHg. FINDINGS  Left Ventricle: Left ventricular ejection fraction, by estimation, is 65 to 70%. The left ventricle has normal function. The left ventricle has no regional wall motion abnormalities. The left ventricular internal cavity size was normal in size. There is  moderate asymmetric left ventricular hypertrophy of the septal segment. Left ventricular diastolic parameters are consistent with Grade II diastolic dysfunction (pseudonormalization). Elevated left atrial pressure. Right Ventricle: The right ventricular size is normal. Mildly increased right ventricular wall thickness. Right ventricular systolic function is normal. Tricuspid regurgitation signal is inadequate for assessing PA pressure. Left Atrium: Left atrial size was moderately dilated. Right Atrium: Right atrial size was mildly dilated. Pericardium: The pericardium was not well  visualized. Mitral Valve: The mitral valve is degenerative in appearance. There is moderate thickening of the mitral valve leaflet(s). Trivial mitral valve regurgitation. No evidence of mitral valve stenosis. Tricuspid Valve: The tricuspid valve is not well visualized. Tricuspid valve regurgitation is trivial. Aortic Valve: The aortic valve is tricuspid. There is mild calcification of the aortic valve. There is mild thickening of the aortic valve. Aortic valve regurgitation is mild to moderate. Mild to moderate aortic valve sclerosis/calcification is present, without any evidence of aortic stenosis. Aortic valve mean gradient measures 4.0 mmHg. Aortic valve peak gradient measures 7.9 mmHg. Aortic valve area, by VTI measures 3.34 cm. Pulmonic Valve: The pulmonic valve was grossly normal. Pulmonic valve regurgitation is trivial. Aorta: The aortic root is normal in size and structure. Pulmonary Artery: The pulmonary artery is not well seen. Venous: The inferior vena cava is normal in size with less than 50% respiratory variability, suggesting right atrial pressure of 8 mmHg. IAS/Shunts: The interatrial septum was not well visualized.  LEFT VENTRICLE PLAX 2D LVIDd:         4.29 cm  Diastology LVIDs:         2.80 cm  LV e' medial:    5.77 cm/s LV PW:         1.10 cm  LV E/e' medial:  17.0 LV IVS:        1.54 cm  LV e' lateral:   10.00 cm/s LVOT diam:     2.00 cm  LV E/e' lateral: 9.8 LV SV:         80 LV SV Index:   41 LVOT Area:     3.14 cm  RIGHT VENTRICLE RV Basal diam:  3.01 cm RV S prime:     10.90 cm/s TAPSE (M-mode): 2.1 cm LEFT ATRIUM              Index       RIGHT ATRIUM           Index LA diam:        3.90 cm  1.97 cm/m  RA Area:     21.00 cm LA Vol (A2C):   84.2 ml  42.55 ml/m RA Volume:   59.20 ml  29.92 ml/m LA Vol (A4C):  103.0 ml 52.05 ml/m LA Biplane Vol: 96.9 ml  48.97 ml/m  AORTIC VALVE                   PULMONIC VALVE AV Area (Vmax):    2.68 cm    PV Vmax:        0.61 m/s AV Area (Vmean):    2.95 cm    PV Peak grad:   1.5 mmHg AV Area (VTI):     3.34 cm    RVOT Peak grad: 2 mmHg AV Vmax:           140.50 cm/s AV Vmean:          91.400 cm/s AV VTI:            0.241 m AV Peak Grad:      7.9 mmHg AV Mean Grad:      4.0 mmHg LVOT Vmax:         120.00 cm/s LVOT Vmean:        85.800 cm/s LVOT VTI:          0.256 m LVOT/AV VTI ratio: 1.06  AORTA Ao Root diam: 2.70 cm MITRAL VALVE MV Area (PHT): 3.89 cm    SHUNTS MV Decel Time: 195 msec    Systemic VTI:  0.26 m MV E velocity: 98.00 cm/s  Systemic Diam: 2.00 cm MV A velocity: 52.40 cm/s MV E/A ratio:  1.87 Cristal Deer End MD Electronically signed by Yvonne Kendall MD Signature Date/Time: 03/13/2020/2:39:30 PM    Final     Scheduled Meds: . apixaban  5 mg Oral BID  . docusate sodium  100 mg Oral BID  . flecainide  100 mg Oral BID  . FLUoxetine  20 mg Oral Daily  . fluticasone  2 spray Each Nare QPM  . furosemide  40 mg Intravenous Daily  . montelukast  10 mg Oral QHS  . oxybutynin  5 mg Oral QPM  . pantoprazole  40 mg Oral Daily  . pramipexole  0.25 mg Oral QID  . sucralfate  1 g Oral TID  . traZODone  100 mg Oral QHS   Continuous Infusions: . [START ON 03/14/2020] levofloxacin (LEVAQUIN) IV     PRN Meds: acetaminophen **OR** acetaminophen, albuterol, ALPRAZolam, carbamazepine, ondansetron **OR** ondansetron (ZOFRAN) IV, tiZANidine, traMADol  Time spent: 35 minutes  Author: Lynden Oxford, MD [facility] 03/13/2020 6:40 PM  To reach On-call, see care teams to locate the attending and reach out via [email or web address]. Between 7PM-7AM, please contact night-coverage If you still have difficulty reaching the attending provider, please page the [facility] (Director on Call) for [facility] on amion for assistance.

## 2020-03-13 NOTE — ED Notes (Signed)
Pt asleep at this tmie

## 2020-03-13 NOTE — ED Notes (Signed)
"  Pt is a little lethargic and can't annunciate her words. Pt is falling asleep midsentence. Pt stated that she takes half of trazadone, but was given the full dose here. Pt states trazadone makes her feel like this. Just wanted to let you know."  Notified Manuela Schwartz NP

## 2020-03-13 NOTE — ED Notes (Signed)
Patient states 8/10 pain in upper back. Tramadol given.

## 2020-03-13 NOTE — Progress Notes (Signed)
PT Cancellation Note  Patient Details Name: [name] MRN: [id number] DOB: 1940-06-26   Cancelled Treatment:    Reason Eval/Treat Not Completed: Medical issues which prohibited therapy (Pt getting heparin for ACS, but pt has not had cardiology consult yet. Pt requiring HHHF in ED. Will hold PT evaluation until pending consults, workup are complete.)  8:31 AM, 03/13/20 Rosamaria Lints, PT, DPT Physical Therapist - [facility]  [phone] (ASCOM)    Tyeler Goedken C 03/13/2020, 8:31 AM

## 2020-03-13 NOTE — Progress Notes (Signed)
ANTICOAGULATION CONSULT NOTE  Pharmacy Consult for Heparin infusion Indication: ACS/STEMI  Allergies  Allergen Reactions  . Ace Inhibitors Cough  . Albuterol Other (See Comments)    Unknown reaction  . Iodine Hives  . Minocycline Other (See Comments)    Onset 04/10/2001.  / unknown reaction  . Norvasc [Amlodipine Besylate] Hives  . Penicillins Hives    Did it involve swelling of the face/tongue/throat, SOB, or low BP? NO Did it involve sudden or severe rash/hives, skin peeling, or any reaction on the inside of your mouth or nose? Yes. Pt reports severe hives (blistering) from neck to her stomach Did you need to seek medical attention at a hospital or doctor's office? Unknown When did it last happen?>36 years ago If all above answers are "NO", may proceed with cephalosporin use.  Marland Kitchen Propoxyphene Hives    Onset 04/10/2001.   . Sulfa Antibiotics Diarrhea and Nausea Only  . Tizanidine Other (See Comments)  . Vicodin [Hydrocodone-Acetaminophen] Hives    Hives occur after 5 doses   . Amlodipine Rash  . Betadine [Povidone Iodine] Rash and Other (See Comments)    blisters  . Clarithromycin Rash    Patient Measurements: Height: 5\' 6"  (167.6 cm) Weight: 88.5 kg (195 lb 1.7 oz) IBW/kg (Calculated) : 59.3 Heparin Dosing Weight: 78.4 kg  Vital Signs: Temp: 97.3 F (36.3 C) (10/21 2126) Temp Source: Oral (10/21 2126) BP: 120/57 (10/22 0350) Pulse Rate: 77 (10/22 0350)  Labs: Recent Labs    03/12/20 1351 03/12/20 1659 03/12/20 1850 03/12/20 2053 03/13/20 0358  HGB 11.9*  --   --   --  11.3*  HCT 36.6  --   --   --  34.8*  PLT 258  --   --   --  216  APTT 44*  --   --   --  116*  LABPROT 19.5*  --   --   --   --   INR 1.7*  --   --   --   --   HEPARINUNFRC  --  >3.60*  --   --  2.96*  CREATININE 1.83*  --   --   --   --   TROPONINIHS  --  409* 342* 309*  --     Estimated Creatinine Clearance: 28.9 mL/min (A) (by C-G formula based on SCr of 1.83 mg/dL  (H)).   Medical History: Past Medical History:  Diagnosis Date  . A-fib (HCC) 2012, 2014  . Anxiety   . Arrhythmia    A-fib  . Bursitis    hips  . Chronic diastolic (congestive) heart failure (HCC)   . Concussion   . Concussion July, 28, 2014  . GERD (gastroesophageal reflux disease)   . Hypertension   . Palpitations   . Pneumonia 2012    Medications:  PTA Apixaban 5mg  BID - last dose 10/21 around 10am  Assessment: 79yo female with PMH for chronic afib (on Eliquis), anxiety, chronic diastolic CHF, GERD, HTN, and obesity. Trop HS 409, BNP 909.5. Pharmacy has been consulted for heparin dosing and monitoring.   Baseline labs: aPTT 44, PT 19.5, INR 1.7, Hgb 11.9 (seems to be baseline), Plt 258; baseline HL pending  Goal of Therapy:  Heparin level 0.3-0.7 units/ml once aPTT and HL begin to correlate aPTT 66-102 seconds Monitor platelets by anticoagulation protocol: Yes   Plan:  Give 4000 units bolus x 1 Start heparin infusion at 900 units/hr Check aPTT in 8 hours and anti-Xa level with AM labs  Continue to monitor H&H and platelets  Ordered add on heparin level for baseline  10/22: HL @ 0358 = 2.96,  APTT = 116 Will decrease heparin drip rate to 750 units/hr and recheck aPTT 8 hrs after rate change.  Will continue to monitor HL daily   Quest Tavenner D, PharmD 03/13/2020 5:37 AM

## 2020-03-13 NOTE — ED Notes (Signed)
Pt

## 2020-03-13 NOTE — ED Notes (Signed)
Pt ate 50% of meal tray

## 2020-03-13 NOTE — Progress Notes (Signed)
Pharmacy Antibiotic Note  [name] is a 79 y.o. female admitted on 03/12/2020 with pneumonia.  Pharmacy has been consulted for levaquin dosing.  Plan: Levaquin 750 mg IV q48h   Height: 5\' 6"  (167.6 cm) Weight: 88.5 kg (195 lb 1.7 oz) IBW/kg (Calculated) : 59.3  Temp (24hrs), Avg:98 F (36.7 C), Min:97.3 F (36.3 C), Max:98.6 F (37 C)  Recent Labs  Lab 03/12/20 1351 03/12/20 1659 03/13/20 0358 03/13/20 0751  WBC 10.8*  --  8.7  --   CREATININE 1.83*  --   --  1.64*  LATICACIDVEN 2.2* 1.6  --   --     Estimated Creatinine Clearance: 32.2 mL/min (A) (by C-G formula based on SCr of 1.64 mg/dL (H)).    Allergies  Allergen Reactions  . Penicillins Hives    Did it involve swelling of the face/tongue/throat, SOB, or low BP? NO Did it involve sudden or severe rash/hives, skin peeling, or any reaction on the inside of your mouth or nose? Yes. Pt reports severe hives (blistering) from neck to her stomach Did you need to seek medical attention at a hospital or doctor's office? Unknown When did it last happen?>36 years ago If all above answers are "NO", may proceed with cephalosporin use.  . Amlodipine Rash  . Clarithromycin Rash  . Minocycline Other (See Comments)    Onset 04/10/2001.  / unknown reaction  . Propoxyphene Hives    Onset 04/10/2001.   . Ace Inhibitors Cough  . Norvasc [Amlodipine Besylate] Hives  . Vicodin [Hydrocodone-Acetaminophen] Hives    Hives occur after 5 doses   . Betadine [Povidone Iodine] Rash and Other (See Comments)    blisters  . Iodine Rash  . Sulfa Antibiotics Diarrhea and Nausea Only    Antimicrobials this admission: Vanc 10/21 >>10/22 Levaquin 10/21 >>  Dose adjustments this admission:   Microbiology results: BCx x1 NGTD UCx collected MRSA PCR neg Sputum cx ordered  Thank you for allowing pharmacy to be a part of this patient's care.  11/21 03/13/2020 2:46 PM

## 2020-03-14 ENCOUNTER — Inpatient Hospital Stay: Payer: Medicare Other

## 2020-03-14 LAB — BASIC METABOLIC PANEL
Anion gap: 9 (ref 5–15)
BUN: 39 mg/dL — ABNORMAL HIGH (ref 8–23)
CO2: 25 mmol/L (ref 22–32)
Calcium: 9.5 mg/dL (ref 8.9–10.3)
Chloride: 101 mmol/L (ref 98–111)
Creatinine, Ser: 1.53 mg/dL — ABNORMAL HIGH (ref 0.44–1.00)
GFR, Estimated: 35 mL/min — ABNORMAL LOW (ref >60)
Glucose, Bld: 97 mg/dL (ref 70–99)
Potassium: 4.8 mmol/L (ref 3.5–5.1)
Sodium: 135 mmol/L (ref 135–145)

## 2020-03-14 LAB — CBC
HCT: 34.1 % — ABNORMAL LOW (ref 36.0–46.0)
Hemoglobin: 11 g/dL — ABNORMAL LOW (ref 12.0–15.0)
MCH: 32.3 pg (ref 26.0–34.0)
MCHC: 32.3 g/dL (ref 30.0–36.0)
MCV: 100 fL (ref 80.0–100.0)
Platelets: 228 10*3/uL (ref 150–400)
RBC: 3.41 MIL/uL — ABNORMAL LOW (ref 3.87–5.11)
RDW: 13.9 % (ref 11.5–15.5)
WBC: 7.1 10*3/uL (ref 4.0–10.5)
nRBC: 0 % (ref 0.0–0.2)

## 2020-03-14 LAB — URINE CULTURE

## 2020-03-14 LAB — MAGNESIUM: Magnesium: 2.2 mg/dL (ref 1.7–2.4)

## 2020-03-14 MED ORDER — COLCHICINE 0.6 MG PO TABS
0.6000 mg | ORAL_TABLET | Freq: Every day | ORAL | Status: DC
  Administered 2020-03-14 – 2020-03-19 (×6): 0.6 mg via ORAL
  Filled 2020-03-14 (×7): qty 1

## 2020-03-14 MED ORDER — OXYCODONE HCL 5 MG PO TABS
5.0000 mg | ORAL_TABLET | Freq: Four times a day (QID) | ORAL | Status: DC | PRN
  Administered 2020-03-14 – 2020-03-17 (×4): 5 mg via ORAL
  Filled 2020-03-14 (×4): qty 1

## 2020-03-14 MED ORDER — METHYLPREDNISOLONE SODIUM SUCC 40 MG IJ SOLR
40.0000 mg | Freq: Two times a day (BID) | INTRAMUSCULAR | Status: DC
  Administered 2020-03-14 – 2020-03-15 (×2): 40 mg via INTRAVENOUS
  Filled 2020-03-14 (×2): qty 1

## 2020-03-14 MED ORDER — BENZONATATE 100 MG PO CAPS
100.0000 mg | ORAL_CAPSULE | Freq: Three times a day (TID) | ORAL | Status: DC
  Administered 2020-03-14 – 2020-03-19 (×16): 100 mg via ORAL
  Filled 2020-03-14 (×16): qty 1

## 2020-03-14 MED ORDER — LIDOCAINE 5 % EX PTCH
1.0000 | MEDICATED_PATCH | CUTANEOUS | Status: DC
  Administered 2020-03-14 – 2020-03-18 (×5): 1 via TRANSDERMAL
  Filled 2020-03-14 (×6): qty 1

## 2020-03-14 NOTE — Progress Notes (Signed)
Pt was a stand by assist OOB to chair. TED hose placed on patient. I will continue to assess.

## 2020-03-14 NOTE — Progress Notes (Signed)
OT Cancellation Note  Patient Details Name: [name] MRN: [id number] DOB: 1941/04/25   Cancelled Treatment:    Reason Eval/Treat Not Completed: Patient at procedure or test/ unavailable. Upon arrival, RN in room reporting pt has 8/10 back pain. Transport arrived to take pt off floor to CT. Will re-attempt at later date/time as able.   Kathie Dike, M.S. OTR/L  03/14/20, 1:12 PM  ascom [phone]

## 2020-03-14 NOTE — Progress Notes (Signed)
MD notified via secure chat. Pt complains of 8/10 back pain after prn medication. Orders placed. I will continue to assess.

## 2020-03-14 NOTE — Progress Notes (Signed)
[facility] Progress Note  Patient: [name]    AXE:[id number]  DOA: 03/12/2020     Date of Service: the patient was seen and examined on 03/14/2020  Brief hospital course: [name] is a 79 y.o. female with Past medical history of chronic A. fib, anxiety, chronic diastolic CHF, GERD, HTN, OSA, obesity.  Presents with severe hypoxia requiring heated high flow nasal cannula for now.  Likely from community-acquired pneumonia as well as CHF. Currently plan is continue IV Lasix and IV antibiotics.  Assessment and Plan: Acute respiratory failure with hypoxia (HCC), POA Sepsis POA. Meeting SIRS criteria with tachycardia, leukocytosis with organ damage and elevated troponin. Likely from community-acquired pneumonia. Potential for acute on chronic diastolic CHF with flash pulmonary edema cannot be ruled out. 75% on room air on admission, currently still occasionally drops to 89% on heated high flow. comfortable and is able to talk in full sentences. COVID-19 test x2 -. MRSA PCR negative therefore antibiotic switch only to Levaquin. Follow-up on cultures.  So far no growth. BNP elevated.  Continue IV Lasix likely switch to p.o. tomorrow. Continue with IV antibiotics.  Follow-up on cultures. CT chest negative for any effusion or any other acute normality.  Multifocal pneumonia as well as chronic bronchiectasis.  Add steroids.  Chronic  Atrial fibrillation (HCC) Elevated troponin Continue home regimen. Briefly was on IV heparin. Troponin less likely consistent with ACS. More likely demand ischemia. Echocardiogram also shows preserved EF without any wall motion abnormality. Back on Eliquis.    GERD (gastroesophageal reflux disease) Continue PPI.    AKI (acute kidney injury) on chronic kidney disease stage IIIb (HCC) Baseline renal function 1.7 On presentation serum creatinine 1.8.  Currently trending up to 1.6. Likely cardiorenal hemodynamics. Patient missed her 2 doses of  Lasix. We will monitor response to IV diuresis.    Hyperkalemia Mild.  Resolved improvement with diuresis.    Macrocytosis Monitor for now. B12 normal.    Acute metabolic encephalopathy In the setting of severe hypoxemia. Patient back to baseline per family.  Goals of care conversation. Patient wants to remain full code. Patient has a healthcare power of attorney. Does not want to have a feeding tube. Patient does not want to remain on life support should she does not have a good quality of life but is dependent on machine to survive.  Body mass index is 31.15 kg/m.   Diet: Cardiac diet DVT Prophylaxis:   Place TED hose Start: 03/14/20 1007 apixaban (ELIQUIS) tablet 5 mg    Advance goals of care discussion: Full code  Family Communication: family was present at bedside, at the time of interview.  The pt provided permission to discuss medical plan with the family. Opportunity was given to ask question and all questions were answered satisfactorily.   Disposition:  Status is: Inpatient  Remains inpatient appropriate because:Hemodynamically unstable   Dispo: The patient is from: Home              Anticipated d/c is to: Home              Anticipated d/c date is: > 3 days              Patient currently is not medically stable to d/c.  Subjective: No nausea no vomiting.  No fever no chills.  Left-sided chest pain which is chronic for her.  No abdominal pain.  Continues to have cough.  Physical Exam:  General: Appear in mild distress, no Rash; Oral  Mucosa Clear, moist. no Abnormal Neck Mass Or lumps, Conjunctiva normal  Cardiovascular: S1 and S2 Present, no Murmur, Respiratory: good respiratory effort, Bilateral Air entry present and CTA, bilateral Crackles, no wheezes Abdomen: Bowel Sound present, Soft and no tenderness Extremities: bilateral  Pedal edema Neurology: alert and oriented to time, place, and person affect appropriate. no new focal deficit Gait not  checked due to patient safety concerns   Vitals:   03/14/20 1137 03/14/20 1629 03/14/20 1640 03/14/20 2008  BP: (!) 112/54 109/66  (!) 131/57  Pulse: 64 66  73  Resp: 19 17  18   Temp: 97.7 F (36.5 C) 98.1 F (36.7 C)  97.7 F (36.5 C)  TempSrc:  Oral  Oral  SpO2: 93% 91% 91% 94%  Weight:      Height:        Intake/Output Summary (Last 24 hours) at 03/14/2020 2011 Last data filed at 03/14/2020 1907 Gross per 24 hour  Intake 240 ml  Output 1650 ml  Net -1410 ml   Filed Weights   03/12/20 1346 03/14/20 0544  Weight: 88.5 kg 87.5 kg    Data Reviewed: I have personally reviewed and interpreted daily labs, tele strips, imagings as discussed above. I reviewed all nursing notes, pharmacy notes, vitals, pertinent old records I have discussed plan of care as described above with RN and patient/family.  CBC: Recent Labs  Lab 03/12/20 1351 03/13/20 0358 03/14/20 0407  WBC 10.8* 8.7 7.1  NEUTROABS 9.3*  --   --   HGB 11.9* 11.3* 11.0*  HCT 36.6 34.8* 34.1*  MCV 101.9* 102.1* 100.0  PLT 258 216 228   Basic Metabolic Panel: Recent Labs  Lab 03/12/20 1351 03/13/20 0751 03/14/20 0407  NA 135 136 135  K 5.6* 4.6 4.8  CL 101 102 101  CO2 23 24 25   GLUCOSE 133* 106* 97  BUN 38* 38* 39*  CREATININE 1.83* 1.64* 1.53*  CALCIUM 9.4 9.0 9.5  MG  --  2.1 2.2    Studies: CT CHEST WO CONTRAST  Result Date: 03/14/2020 CLINICAL DATA:  Shortness of breath, chest pain. EXAM: CT CHEST WITHOUT CONTRAST TECHNIQUE: Multidetector CT imaging of the chest was performed following the standard protocol without IV contrast. COMPARISON:  November 18, 2009. FINDINGS: Cardiovascular: Atherosclerosis of thoracic aorta is noted without aneurysm formation. Mild cardiomegaly is noted. No pericardial effusion is noted. Mediastinum/Nodes: Large sliding-type hiatal hernia is noted. No adenopathy is noted. Thyroid gland is unremarkable. Lungs/Pleura: No pneumothorax or pleural effusion is noted. Interval  development of patchy airspace opacities throughout both lungs concerning for multifocal pneumonia. Bronchiectasis is noted in both lower lobes. Mild bilateral posterior basilar subsegmental atelectasis is noted. Upper Abdomen: No acute abnormality. Musculoskeletal: No chest wall mass or suspicious bone lesions identified. IMPRESSION: 1. Interval development of patchy airspace opacities throughout both lungs concerning for multifocal pneumonia. 2. Bronchiectasis is noted in both lower lobes. 3. Large sliding-type hiatal hernia. 4. Aortic atherosclerosis. Aortic Atherosclerosis (ICD10-I70.0). Electronically Signed   By: 03/16/2020 M.D.   On: 03/14/2020 15:25    Scheduled Meds: . apixaban  5 mg Oral BID  . benzonatate  100 mg Oral TID  . colchicine  0.6 mg Oral Daily  . docusate sodium  100 mg Oral BID  . flecainide  100 mg Oral BID  . FLUoxetine  20 mg Oral Daily  . fluticasone  2 spray Each Nare QPM  . furosemide  40 mg Intravenous Daily  . lidocaine  1 patch  Transdermal Q24H  . methylPREDNISolone (SOLU-MEDROL) injection  40 mg Intravenous BID  . montelukast  10 mg Oral QHS  . oxybutynin  5 mg Oral QPM  . pantoprazole  40 mg Oral Daily  . pramipexole  0.25 mg Oral QID  . sodium chloride flush  10 mL Intravenous Q12H  . sucralfate  1 g Oral TID  . traZODone  100 mg Oral QHS   Continuous Infusions: . levofloxacin (LEVAQUIN) IV 750 mg (03/14/20 1646)   PRN Meds: acetaminophen **OR** acetaminophen, albuterol, ALPRAZolam, carbamazepine, ondansetron **OR** ondansetron (ZOFRAN) IV, oxyCODONE, tiZANidine, traMADol  Time spent: 35 minutes  Author: Lynden Oxford, MD [facility] 03/14/2020 8:11 PM  To reach On-call, see care teams to locate the attending and reach out via [email or web address]. Between 7PM-7AM, please contact night-coverage If you still have difficulty reaching the attending provider, please page the [facility] (Director on Call) for [facility] on amion for assistance.

## 2020-03-14 NOTE — Progress Notes (Signed)
PT Cancellation Note  Patient Details Name: [name] MRN: [id number] DOB: 1940/05/25   Cancelled Treatment:    Reason Eval/Treat Not Completed:  (Chart reviewed for re-attempt at evaluation. Patient just left unit for diagnostic imaging; will re-attempt at later time/date as medically appropriate and available.)   Dashon Mcintire H. Manson Passey, PT, DPT, NCS 03/14/20, 1:09 PM [phone]

## 2020-03-14 NOTE — Plan of Care (Signed)
  Problem: Clinical Measurements: Goal: Ability to maintain a body temperature in the normal range will improve Outcome: Progressing   Problem: Respiratory: Goal: Ability to maintain a clear airway will improve Outcome: Progressing   

## 2020-03-15 LAB — BASIC METABOLIC PANEL
Anion gap: 12 (ref 5–15)
BUN: 50 mg/dL — ABNORMAL HIGH (ref 8–23)
CO2: 24 mmol/L (ref 22–32)
Calcium: 9.8 mg/dL (ref 8.9–10.3)
Chloride: 99 mmol/L (ref 98–111)
Creatinine, Ser: 1.67 mg/dL — ABNORMAL HIGH (ref 0.44–1.00)
GFR, Estimated: 31 mL/min — ABNORMAL LOW (ref >60)
Glucose, Bld: 176 mg/dL — ABNORMAL HIGH (ref 70–99)
Potassium: 4.6 mmol/L (ref 3.5–5.1)
Sodium: 135 mmol/L (ref 135–145)

## 2020-03-15 LAB — CBC
HCT: 37.2 % (ref 36.0–46.0)
Hemoglobin: 12.5 g/dL (ref 12.0–15.0)
MCH: 32.9 pg (ref 26.0–34.0)
MCHC: 33.6 g/dL (ref 30.0–36.0)
MCV: 97.9 fL (ref 80.0–100.0)
Platelets: 276 10*3/uL (ref 150–400)
RBC: 3.8 MIL/uL — ABNORMAL LOW (ref 3.87–5.11)
RDW: 13.4 % (ref 11.5–15.5)
WBC: 6.4 10*3/uL (ref 4.0–10.5)
nRBC: 0 % (ref 0.0–0.2)

## 2020-03-15 LAB — LEGIONELLA PNEUMOPHILA SEROGP 1 UR AG: L. pneumophila Serogp 1 Ur Ag: NEGATIVE

## 2020-03-15 MED ORDER — METHYLPREDNISOLONE SODIUM SUCC 125 MG IJ SOLR
80.0000 mg | Freq: Two times a day (BID) | INTRAMUSCULAR | Status: DC
  Administered 2020-03-15 – 2020-03-18 (×7): 80 mg via INTRAVENOUS
  Filled 2020-03-15 (×7): qty 2

## 2020-03-15 NOTE — Evaluation (Signed)
Physical Therapy Evaluation Patient Details Name: [name] MRN: [id number] DOB: 07-01-1940 Today's Date: 03/15/2020   History of Present Illness  [name] is a 79 y.o. female with Past medical history of chronic A. fib, anxiety, chronic diastolic CHF, GERD, HTN, OSA, obesity.  Presents with severe hypoxia requiring heated high flow nasal cannula for now.  Likely from community-acquired pneumonia as well as CHF.  Clinical Impression  Pt was seen in her room while up in chair on 40L HHFNC, has very relaxed appearance and comfortable.  Cautioned PT that her R lower leg has some sensitivity, and altered mm testing to manage this.  Pt is minimal help to stand, but is not able to walk away from the system where she is attached. Pt demonstrates fairly good LE strength, but is unsteady on her feet and requiring RW for support.  Her plan is to get family and friends to stay with her at all times, and will start with home therapy to recover her control of standing.  Pt is unable to stand, control sitting or take steps currently, and will get therapy for balance training, endurance on walker and gait quality with PT.  Her efforts are good, attitude is positive and her expectations from staff to get stronger are high.  See acutely for goals of PT.      Follow Up Recommendations Home health PT;Supervision for mobility/OOB;Supervision/Assistance - 24 hour    Equipment Recommendations  None recommended by PT    Recommendations for Other Services       Precautions / Restrictions Precautions Precautions: Fall Restrictions Weight Bearing Restrictions: No      Mobility  Bed Mobility Overal bed mobility: Modified Independent             General bed mobility comments: up inchair when PT arrived    Transfers Overall transfer level: Needs assistance Equipment used: Rolling walker (2 wheeled) Transfers: Sit to/from Stand Sit to Stand: Min guard;Min assist         General transfer  comment: assist for lines/leads  Ambulation/Gait             General Gait Details: deferred due to heated air attachment  Stairs            Wheelchair Mobility    Modified Rankin (Stroke Patients Only)       Balance Overall balance assessment: Needs assistance Sitting-balance support: Feet supported Sitting balance-Leahy Scale: Good     Standing balance support: Bilateral upper extremity supported;Single extremity supported Standing balance-Leahy Scale: Fair                               Pertinent Vitals/Pain Pain Assessment: Faces Faces Pain Scale: Hurts a little bit Pain Location: R lower leg Pain Descriptors / Indicators: Guarding Pain Intervention(s): Monitored during session;Repositioned    Home Living Family/patient expects to be discharged to:: Private residence Living Arrangements: Alone Available Help at Discharge: Family;Friend(s);Available 24 hours/day Type of Home: House Home Access: Ramped entrance     Home Layout: One level Home Equipment: Walker - 4 wheels;Walker - 2 wheels;Shower seat;Bedside commode;Grab bars - toilet      Prior Function Level of Independence: Independent with assistive device(s);Independent         Comments: holds furniture or uses Oncologist Dominance   Dominant Hand: Right    Extremity/Trunk Assessment   Upper Extremity Assessment Upper Extremity Assessment: Generalized weakness  Lower Extremity Assessment Lower Extremity Assessment: Generalized weakness (mild)    Cervical / Trunk Assessment Cervical / Trunk Assessment: Kyphotic  Communication   Communication: No difficulties  Cognition Arousal/Alertness: Awake/alert Behavior During Therapy: WFL for tasks assessed/performed Overall Cognitive Status: Within Functional Limits for tasks assessed                                        General Comments General comments (skin integrity, edema, etc.): seeping  IV    Exercises Other Exercises Other Exercises: Pt educated re: OT role, DME recs, d/c recs, falls prevention, ECS, home/routines modifications Other Exercises: LBD, perihygiene, sup>sit, sit<>stand, SPT, sitting/standing balance/tolerance   Assessment/Plan    PT Assessment Patient needs continued PT services  PT Problem List Decreased strength;Decreased range of motion;Decreased activity tolerance;Decreased balance;Decreased coordination;Decreased knowledge of use of DME;Decreased safety awareness;Cardiopulmonary status limiting activity;Pain;Decreased skin integrity       PT Treatment Interventions DME instruction;Gait training;Functional mobility training;Therapeutic activities;Therapeutic exercise;Balance training;Neuromuscular re-education;Patient/family education    PT Goals (Current goals can be found in the Care Plan section)  Acute Rehab PT Goals Patient Stated Goal: To return home PT Goal Formulation: With patient Time For Goal Achievement: 03/22/20 Potential to Achieve Goals: Good    Frequency Min 2X/week   Barriers to discharge Decreased caregiver support home alone but has help to call in    Co-evaluation               AM-PAC PT "6 Clicks" Mobility  Outcome Measure Help needed turning from your back to your side while in a flat bed without using bedrails?: A Little Help needed moving from lying on your back to sitting on the side of a flat bed without using bedrails?: A Lot Help needed moving to and from a bed to a chair (including a wheelchair)?: A Little Help needed standing up from a chair using your arms (e.g., wheelchair or bedside chair)?: A Little Help needed to walk in hospital room?: A Little Help needed climbing 3-5 steps with a railing? : A Lot 6 Click Score: 16    End of Session Equipment Utilized During Treatment: Gait belt Activity Tolerance: Patient limited by fatigue;Treatment limited secondary to medical complications (Comment) Patient  left: in chair;with call bell/phone within reach Nurse Communication: Mobility status;Other (comment) (monitor O2 sats) PT Visit Diagnosis: Unsteadiness on feet (R26.81);Muscle weakness (generalized) (M62.81);Difficulty in walking, not elsewhere classified (R26.2);Dizziness and giddiness (R42)    Time: 1410-1434 PT Time Calculation (min) (ACUTE ONLY): 24 min   Charges:   PT Evaluation $PT Eval Moderate Complexity: 1 Mod PT Treatments $Therapeutic Activity: 8-22 mins       Ivar Drape 03/15/2020, 3:26 PM  Samul Dada, PT MS Acute Rehab Dept. Number: [facility] [id number] and [facility] [phone]

## 2020-03-15 NOTE — Evaluation (Signed)
Occupational Therapy Evaluation Patient Details Name: [name] MRN: [id number] DOB: [birth year] Today's Date: 03/15/2020    History of Present Illness [name] is a 79 y.o. female with Past medical history of chronic A. fib, anxiety, chronic diastolic CHF, GERD, HTN, OSA, obesity.  Presents with severe hypoxia requiring heated high flow nasal cannula for now.  Likely from community-acquired pneumonia as well as CHF.   Clinical Impression   Ms [name] was seen for OT evaluation this date. Prior to hospital admission, pt was MOD I for mobility and I/ADLs using 4WW. Pt lives in home c ramped entrance and family/frineds available 24/7. Pt presents to acute OT demonstrating impaired ADL performance and functional mobility 2/2 decreased activity tolerance, functional balance/strength deficits, and poor insight into deficits. Upon arrival L wrist noted to be bleeding near IV site - RN notified and in room to address.  Pt currently requires MOD I don B socks seated EOB. CGA for BSC t/f - assist for lines mgmt. SBA perihygiene in standing. SpO2 95% on 40L HHFNC during mobility, resolved to 100% c seated rest. Pt would benefit from skilled OT to address noted impairments and functional limitations (see below for any additional details) in order to maximize safety and independence while minimizing falls risk and caregiver burden. Upon hospital discharge, recommend HHOT to maximize pt safety and return to functional independence during meaningful occupations of daily life.     Follow Up Recommendations  Home health OT;Supervision/Assistance - 24 hour    Equipment Recommendations  None recommended by OT    Recommendations for Other Services       Precautions / Restrictions Precautions Precautions: Fall Restrictions Weight Bearing Restrictions: No      Mobility Bed Mobility Overal bed mobility: Modified Independent     Transfers Overall transfer level: Needs assistance Equipment used: 1  person hand held assist Transfers: Sit to/from Stand Sit to Stand: Min guard         General transfer comment: assist for lines/leads    Balance Overall balance assessment: Needs assistance Sitting-balance support: No upper extremity supported;Feet supported Sitting balance-Leahy Scale: Good     Standing balance support: Single extremity supported;During functional activity Standing balance-Leahy Scale: Fair      ADL either performed or assessed with clinical judgement   ADL Overall ADL's : Needs assistance/impaired      General ADL Comments: MOD I don B socks seated EOB. CGA for BSC t/f - assist for lines mgmt. SBA perihygiene in standing                  Pertinent Vitals/Pain Pain Assessment: No/denies pain     Hand Dominance     Extremity/Trunk Assessment Upper Extremity Assessment Upper Extremity Assessment: Generalized weakness   Lower Extremity Assessment Lower Extremity Assessment: Generalized weakness       Communication Communication Communication: No difficulties   Cognition Arousal/Alertness: Awake/alert Behavior During Therapy: WFL for tasks assessed/performed Overall Cognitive Status: Within Functional Limits for tasks assessed          General Comments  L wrist IV noted to be bleeding - RN notified and in room to address. SpO2 95% on 40L HHFNC during mobility, resolved to 100% c seated rest    Exercises Exercises: Other exercises Other Exercises Other Exercises: Pt educated re: OT role, DME recs, d/c recs, falls prevention, ECS, home/routines modifications Other Exercises: LBD, perihygiene, sup>sit, sit<>stand, SPT, sitting/standing balance/tolerance        Home Living Family/patient expects to  be discharged to:: Private residence Living Arrangements: Alone Available Help at Discharge: Family;Friend(s);Available 24 hours/day Type of Home: House Home Access: Ramped entrance     Home Layout: One level     Bathroom Shower/Tub:  Producer, television/film/video: Handicapped height     Home Equipment: Environmental consultant - 4 wheels;Walker - 2 wheels;Shower seat;Bedside commode;Grab bars - toilet          Prior Functioning/Environment Level of Independence: Independent with assistive device(s)        Comments: Uses 4WW for community mobility and if carrying objects in house        OT Problem List: Decreased strength;Decreased activity tolerance;Impaired balance (sitting and/or standing);Cardiopulmonary status limiting activity      OT Treatment/Interventions: Self-care/ADL training;Therapeutic exercise;Energy conservation;DME and/or AE instruction;Therapeutic activities;Patient/family education;Balance training    OT Goals(Current goals can be found in the care plan section) Acute Rehab OT Goals Patient Stated Goal: To return home OT Goal Formulation: With patient Time For Goal Achievement: 03/29/20 Potential to Achieve Goals: Good ADL Goals Pt Will Perform Grooming: with modified independence;standing (c LRAD PRN) Pt Will Perform Lower Body Dressing: with modified independence;sit to/from stand (c LRAD PRN) Pt Will Transfer to Toilet: with modified independence;ambulating;bedside commode (c LRAD PRN) Additional ADL Goal #1: Pt will Independently verbalize plan to implement x3 ECS  OT Frequency: Min 2X/week    AM-PAC OT "6 Clicks" Daily Activity     Outcome Measure Help from another person eating meals?: None Help from another person taking care of personal grooming?: A Little Help from another person toileting, which includes using toliet, bedpan, or urinal?: A Little Help from another person bathing (including washing, rinsing, drying)?: A Little Help from another person to put on and taking off regular upper body clothing?: None Help from another person to put on and taking off regular lower body clothing?: A Little 6 Click Score: 20   End of Session Equipment Utilized During Treatment: Oxygen Nurse  Communication: Other (comment) (IV bleeding)  Activity Tolerance: Patient tolerated treatment well Patient left: in chair;with call bell/phone within reach  OT Visit Diagnosis: Other abnormalities of gait and mobility (R26.89)                Time: 5102-5852 OT Time Calculation (min): 24 min Charges:  OT General Charges $OT Visit: 1 Visit OT Evaluation $OT Eval Low Complexity: 1 Low OT Treatments $Self Care/Home Management : 8-22 mins  Kathie Dike, M.S. OTR/L  03/15/20, 1:44 PM  ascom [phone]

## 2020-03-15 NOTE — Progress Notes (Signed)
[facility] Progress Note  Patient: [name]    IFO:[id number]  DOA: 03/12/2020     Date of Service: the patient was seen and examined on 03/15/2020  Brief hospital course: [name] Couchis a 79 y.o.femalewith Past medical history ofchronic A. fib, anxiety, chronic diastolic CHF, GERD, HTN,OSA, obesity. Presents with severe hypoxia requiring heated high flow nasal cannula for now.  Likely from community-acquired pneumonia as well as CHF. Currently plan is continue current care for pneumonia and hypoxia.  Assessment and Plan: Acute respiratory failure with hypoxia (HCC), POA Sepsis POA due to Community-acquired pneumonia. Likely viral. Meeting SIRS criteria with tachycardia, leukocytosis with organ damage and elevated troponin. 75% on room air on admission, currently still occasionally drops to 89% on heated high flow at 40 L/min comfortable and is able to talk in full sentences. COVID-19 test x2 negative. MRSA PCR negative therefore antibiotic switch only to Levaquin. Follow-up on cultures.  So far no growth. Continue with IV antibiotics.  Follow-up on cultures. CT chest negative for any effusion or any other acute normality. Multifocal pneumonia as well as chronic bronchiectasis.  Add steroids.  Acute on chronic diastolic CHF with flash pulmonary edema cannot be ruled out. BNP elevated.   Treated with IV Lasix  Hold today, switch to p.o. tomorrow. Echocardiogram showed preserved EF 65 to 70% with grade 2 diastolic dysfunction and LVH.  Chronic Atrial fibrillation (HCC) Elevated troponin Continue home regimen. Briefly was on IV heparin. Troponin less likely consistent with ACS. More likely demand ischemia. Echocardiogram also shows preserved EF without any wall motion abnormality. Back on Eliquis.  GERD (gastroesophageal reflux disease) Continue PPI.  AKI (acute kidney injury) on chronic kidney disease stage IIIb (HCC) Baseline renal function 1.7 On  presentation serum creatinine 1.8.  Currently trending up to 1.6. Likely cardiorenal hemodynamics. Patient missed her 2 doses of Lasix. Mild worsening of renal function today.  Therefore we will be holding Lasix.  Hyperkalemia Mild.  Resolved improvement with diuresis.  Macrocytosis Monitor for now. B12 normal.  Acute metabolic encephalopathy In the setting of severe hypoxemia. Patient back to baseline per family.  Goals of care conversation. Patient wants to remain full code. Patient has a healthcare power of attorney. Does not want to have a feeding tube. Patient does not want to remain on life support should she does not have a good quality of life but is dependent on machine to survive.  Diet: Cardiac diet DVT Prophylaxis:   Place TED hose Start: 03/14/20 1007 apixaban (ELIQUIS) tablet 5 mg    Advance goals of care discussion: Full code  Family Communication: no family was present at bedside, at the time of interview.  The pt provided permission to discuss medical plan with the family.  Discussed with family on the phone.  Opportunity was given to ask question and all questions were answered satisfactorily.   Disposition:  Status is: Inpatient  Remains inpatient appropriate because:Hemodynamically unstable  Dispo: The patient is from: Home              Anticipated d/c is to: Home              Anticipated d/c date is: 3 days              Patient currently is not medically stable to d/c.  Subjective: No nausea no vomiting no fever no chills.  No chest pain.  No abdominal pain.  Pain well controlled.  Physical Exam:  General: Appear in mild distress,  no Rash; Oral Mucosa Clear, moist. no Abnormal Neck Mass Or lumps, Conjunctiva normal  Cardiovascular: S1 and S2 Present, no Murmur, Respiratory: increased respiratory effort, Bilateral Air entry present and bilateral  Crackles, no wheezes Abdomen: Bowel Sound present, Soft and no tenderness Extremities: bilateral   Pedal edema Neurology: alert and oriented to time, place, and person affect appropriate. no new focal deficit Gait not checked due to patient safety concerns  Vitals:   03/15/20 1243 03/15/20 1539 03/15/20 1542 03/15/20 1543  BP:   103/77   Pulse:   (!) 55   Resp:   19   Temp:   98.4 F (36.9 C)   TempSrc:   Oral   SpO2: 96% 95% 95% 95%  Weight:      Height:        Intake/Output Summary (Last 24 hours) at 03/15/2020 1555 Last data filed at 03/15/2020 1000 Gross per 24 hour  Intake 396.72 ml  Output 250 ml  Net 146.72 ml   Filed Weights   03/12/20 1346 03/14/20 0544 03/15/20 0355  Weight: 88.5 kg 87.5 kg 88 kg    Data Reviewed: I have personally reviewed and interpreted daily labs, tele strips, imagings as discussed above. I reviewed all nursing notes, pharmacy notes, vitals, pertinent old records I have discussed plan of care as described above with RN and patient/family.  CBC: Recent Labs  Lab 03/12/20 1351 03/13/20 0358 03/14/20 0407 03/15/20 0523  WBC 10.8* 8.7 7.1 6.4  NEUTROABS 9.3*  --   --   --   HGB 11.9* 11.3* 11.0* 12.5  HCT 36.6 34.8* 34.1* 37.2  MCV 101.9* 102.1* 100.0 97.9  PLT 258 216 228 276   Basic Metabolic Panel: Recent Labs  Lab 03/12/20 1351 03/13/20 0751 03/14/20 0407 03/15/20 0523  NA 135 136 135 135  K 5.6* 4.6 4.8 4.6  CL 101 102 101 99  CO2 23 24 25 24   GLUCOSE 133* 106* 97 176*  BUN 38* 38* 39* 50*  CREATININE 1.83* 1.64* 1.53* 1.67*  CALCIUM 9.4 9.0 9.5 9.8  MG  --  2.1 2.2  --     Studies: No results found.  Scheduled Meds: . apixaban  5 mg Oral BID  . benzonatate  100 mg Oral TID  . colchicine  0.6 mg Oral Daily  . docusate sodium  100 mg Oral BID  . flecainide  100 mg Oral BID  . FLUoxetine  20 mg Oral Daily  . fluticasone  2 spray Each Nare QPM  . lidocaine  1 patch Transdermal Q24H  . methylPREDNISolone (SOLU-MEDROL) injection  80 mg Intravenous BID  . montelukast  10 mg Oral QHS  . oxybutynin  5 mg Oral  QPM  . pantoprazole  40 mg Oral Daily  . pramipexole  0.25 mg Oral QID  . sodium chloride flush  10 mL Intravenous Q12H  . sucralfate  1 g Oral TID  . traZODone  100 mg Oral QHS   Continuous Infusions: . levofloxacin (LEVAQUIN) IV Stopped (03/14/20 1836)   PRN Meds: acetaminophen **OR** acetaminophen, albuterol, ALPRAZolam, carbamazepine, ondansetron **OR** ondansetron (ZOFRAN) IV, oxyCODONE, tiZANidine, traMADol  Time spent: 35 minutes  Author: 03/16/20, MD [facility] 03/15/2020 3:55 PM  To reach On-call, see care teams to locate the attending and reach out via [email or web address]/17/2020. Between 7PM-7AM, please contact night-coverage If you still have difficulty reaching the attending provider, please page the [facility] (Director on Call) for [facility] on amion for assistance.

## 2020-03-15 NOTE — Plan of Care (Signed)
  Problem: Clinical Measurements: Goal: Ability to maintain clinical measurements within normal limits will improve Outcome: Progressing Goal: Diagnostic test results will improve Outcome: Progressing Goal: Respiratory complications will improve Outcome: Progressing Goal: Cardiovascular complication will be avoided Outcome: Progressing   

## 2020-03-16 LAB — CBC
HCT: 37.8 % (ref 36.0–46.0)
Hemoglobin: 13 g/dL (ref 12.0–15.0)
MCH: 33.6 pg (ref 26.0–34.0)
MCHC: 34.4 g/dL (ref 30.0–36.0)
MCV: 97.7 fL (ref 80.0–100.0)
Platelets: 292 10*3/uL (ref 150–400)
RBC: 3.87 MIL/uL (ref 3.87–5.11)
RDW: 13.3 % (ref 11.5–15.5)
WBC: 9.7 10*3/uL (ref 4.0–10.5)
nRBC: 0 % (ref 0.0–0.2)

## 2020-03-16 LAB — BASIC METABOLIC PANEL
Anion gap: 9 (ref 5–15)
BUN: 60 mg/dL — ABNORMAL HIGH (ref 8–23)
CO2: 25 mmol/L (ref 22–32)
Calcium: 9.9 mg/dL (ref 8.9–10.3)
Chloride: 103 mmol/L (ref 98–111)
Creatinine, Ser: 1.38 mg/dL — ABNORMAL HIGH (ref 0.44–1.00)
GFR, Estimated: 39 mL/min — ABNORMAL LOW (ref >60)
Glucose, Bld: 164 mg/dL — ABNORMAL HIGH (ref 70–99)
Potassium: 4.3 mmol/L (ref 3.5–5.1)
Sodium: 137 mmol/L (ref 135–145)

## 2020-03-16 LAB — C-REACTIVE PROTEIN: CRP: 6.2 mg/dL — ABNORMAL HIGH (ref <1.0)

## 2020-03-16 LAB — MAGNESIUM: Magnesium: 2.7 mg/dL — ABNORMAL HIGH (ref 1.7–2.4)

## 2020-03-16 NOTE — Care Management Important Message (Signed)
Important Message  Patient Details  Name: [name] MRN: [id number] Date of Birth: [birth year]   Medicare Important Message Given:  Yes     [name] 03/16/2020, 12:10 PM

## 2020-03-16 NOTE — Plan of Care (Signed)
°  Problem: Clinical Measurements: Goal: Ability to maintain clinical measurements within normal limits will improve Outcome: Progressing   Problem: Clinical Measurements: Goal: Will remain free from infection Outcome: Progressing   Problem: Clinical Measurements: Goal: Diagnostic test results will improve Outcome: Progressing   Problem: Activity: Goal: Risk for activity intolerance will decrease Outcome: Progressing   Problem: Respiratory: Goal: Ability to maintain adequate ventilation will improve Outcome: Progressing   Problem: Respiratory: Goal: Ability to maintain a clear airway will improve Outcome: Progressing

## 2020-03-16 NOTE — Progress Notes (Signed)
Physical Therapy Treatment Patient Details Name: [name] MRN: [id number] DOB: [birth year] Today's Date: 03/16/2020    History of Present Illness [name] is a 79 y.o. female with Past medical history of chronic A. fib, anxiety, chronic diastolic CHF, GERD, HTN, OSA, obesity.  Presents with severe hypoxia requiring heated high flow nasal cannula for now.  Likely from community-acquired pneumonia as well as CHF.    PT Comments    Pt was pleasant and motivated to participate during the session.Pt was able to perform all bed level therex with vials WFL. Pt was able to perform all aspects of bed mobility Mod I for use of bedrails and elevated HOB. Pt performed bridges for LE dressing and demonstrated good strength in bilateral LE. Pt was able to perform STS with Min guard to RW. Pt was able to ambulate 20' with Min Guard and RW. Distance was limited by tube length of catheter and HHFNC. Pt's SpO2 remained in the 90's throughout ambulation. The lowest SpO2 reading with ambulation was 90. Pt was able to bring SpO2 up to 100 with standing rest breaks and PLB. Pt will benefit from HHPT services upon discharge to safely address deficits listed in patient problem list for decreased caregiver assistance and eventual return to PLOF.    Follow Up Recommendations  Home health PT;Supervision for mobility/OOB     Equipment Recommendations  None recommended by PT    Recommendations for Other Services       Precautions / Restrictions Restrictions Weight Bearing Restrictions: No    Mobility  Bed Mobility Overal bed mobility: Modified Independent             General bed mobility comments: HOB elevated and use of bed rails  Transfers   Equipment used: Rolling walker (2 wheeled) Transfers: Sit to/from Stand Sit to Stand: Min guard         General transfer comment: pt able to perform with ease and no LOB  Ambulation/Gait Ambulation/Gait assistance: Min guard Gait Distance (Feet):  20 Feet Assistive device: Rolling walker (2 wheeled) Gait Pattern/deviations: WFL(Within Functional Limits)     General Gait Details: Gair WFL. Ambulation limited by length of HHFNC   Stairs             Wheelchair Mobility    Modified Rankin (Stroke Patients Only)       Balance Overall balance assessment: Needs assistance Sitting-balance support: Feet supported;No upper extremity supported Sitting balance-Leahy Scale: Normal     Standing balance support: Bilateral upper extremity supported Standing balance-Leahy Scale: Good Standing balance comment: pt able to perform static and dynamic balance with bilateral UE support                            Cognition Arousal/Alertness: Awake/alert Behavior During Therapy: WFL for tasks assessed/performed Overall Cognitive Status: Within Functional Limits for tasks assessed                                        Exercises Total Joint Exercises Ankle Circles/Pumps: AROM;Both;15 reps Gluteal Sets: AROM;Both;10 reps Towel Squeeze: AROM;Both;10 reps Short Arc Quad: AROM;Both;10 reps Heel Slides: AROM;Both;10 reps Straight Leg Raises: AROM;Both;10 reps Long Arc Quad: AROM;Both;10 reps Other Exercises Other Exercises: pt performs Rolling L and R for linene changing Other Exercises: pt performs bridges x5 for lower body dressing Other Exercises: pt performs  salsa steps for coordniation 1 minute    General Comments        Pertinent Vitals/Pain Pain Assessment: No/denies pain Pain Intervention(s): Limited activity within patient's tolerance    Home Living                      Prior Function            PT Goals (current goals can now be found in the care plan section) Acute Rehab PT Goals Patient Stated Goal: To return home PT Goal Formulation: With patient Time For Goal Achievement: 03/22/20 Potential to Achieve Goals: Good Progress towards PT goals: Progressing toward  goals    Frequency    Min 2X/week      PT Plan      Co-evaluation              AM-PAC PT "6 Clicks" Mobility   Outcome Measure  Help needed turning from your back to your side while in a flat bed without using bedrails?: None Help needed moving from lying on your back to sitting on the side of a flat bed without using bedrails?: None Help needed moving to and from a bed to a chair (including a wheelchair)?: A Little Help needed standing up from a chair using your arms (e.g., wheelchair or bedside chair)?: A Little Help needed to walk in hospital room?: A Little Help needed climbing 3-5 steps with a railing? : A Lot 6 Click Score: 19    End of Session Equipment Utilized During Treatment: Gait belt Activity Tolerance: Patient limited by fatigue;Treatment limited secondary to medical complications (Comment) Patient left: in chair;with call bell/phone within reach;with chair alarm set Nurse Communication: Mobility status (monitor O2 sats) PT Visit Diagnosis: Unsteadiness on feet (R26.81);Muscle weakness (generalized) (M62.81);Difficulty in walking, not elsewhere classified (R26.2);Dizziness and giddiness (R42)     Time: 1610-9604 PT Time Calculation (min) (ACUTE ONLY): 40 min  Charges:                        Nicolette Bang, SPT 03/16/20. 4:30 PM

## 2020-03-16 NOTE — Progress Notes (Signed)
[facility] Progress Note  Patient: [name]    NFA:[id number]  DOA: 03/12/2020     Date of Service: the patient was seen and examined on 03/16/2020  Brief hospital course: [name] a 79 y.o.femalewith Past medical history ofchronic A. fib, anxiety, chronic diastolic CHF, GERD, HTN,OSA, obesity. Presents with severe hypoxia requiring heated high flow nasal cannula for now.  Likely from community-acquired pneumonia as well as CHF. Currently plan is continue current care for pneumonia and hypoxia.  Assessment and Plan: Acute respiratory failure with hypoxia (HCC), POA Sepsis POA due to Community-acquired pneumonia. Likely viral. Meeting SIRS criteria with tachycardia, leukocytosis with organ damage and elevated troponin. 75% on room air on admission, currently still occasionally drops to 89% on heated high flow at 40 L/min comfortable and is able to talk in full sentences. COVID-19 test x2 negative. MRSA PCR negative therefore antibiotic switch only to Levaquin. Follow-up on cultures.  So far no growth. Continue with antibiotics.  Follow-up on cultures. CT chest negative for any effusion or any other acute normality. Multifocal pneumonia as well as chronic bronchiectasis.  Add steroids.  Acute on chronic diastolic CHF with flash pulmonary edema cannot be ruled out. BNP elevated.   Treated with IV Lasix  Switch to p.o. Echocardiogram showed preserved EF 65 to 70% with grade 2 diastolic dysfunction and LVH.  Chronic Atrial fibrillation (HCC) Elevated troponin Continue home regimen. Briefly was on IV heparin. Troponin less likely consistent with ACS. More likely demand ischemia. Echocardiogram also shows preserved EF without any wall motion abnormality. Back on Eliquis.  GERD (gastroesophageal reflux disease) Continue PPI.  AKI (acute kidney injury) on chronic kidney disease stage IIIb (HCC) Baseline renal function 1.7 On presentation serum creatinine 1.8.   Currently trending up to 1.6. Likely cardiorenal hemodynamics. Patient missed her 2 doses of Lasix. Mild worsening of renal function today.  Therefore we will be holding Lasix.  Hyperkalemia Mild.  Resolved improvement with diuresis.  Macrocytosis Monitor for now. B12 normal.  Acute metabolic encephalopathy In the setting of severe hypoxemia. Patient back to baseline per family.  Goals of care conversation. Patient wants to remain full code. Patient has a healthcare power of attorney. Does not want to have a feeding tube. Patient does not want to remain on life support should she does not have a good quality of life but is dependent on machine to survive.  Diet: Cardiac diet DVT Prophylaxis:   Place TED hose Start: 03/14/20 1007 apixaban (ELIQUIS) tablet 5 mg    Advance goals of care discussion: Full code  Family Communication: no family was present at bedside, at the time of interview.  The pt provided permission to discuss medical plan with the family.  Discussed with family on the phone.  Opportunity was given to ask question and all questions were answered satisfactorily.   Disposition:  Status is: Inpatient  Remains inpatient appropriate because:Hemodynamically unstable  Dispo: The patient is from: Home              Anticipated d/c is to: Home              Anticipated d/c date is: 3 days              Patient currently is not medically stable to d/c.  Subjective: Continues to have some shortness of breath.  No nausea no vomiting.  Pain improving.  Physical Exam:  General: Appear in mild distress, no Rash; Oral Mucosa Clear, moist. no Abnormal Neck  Mass Or lumps, Conjunctiva normal  Cardiovascular: S1 and S2 Present, no Murmur, Respiratory: increased respiratory effort, Bilateral Air entry present and bilateral  Crackles, no wheezes Abdomen: Bowel Sound present, Soft and no tenderness Extremities: bilateral  Pedal edema Neurology: alert and oriented to  time, place, and person affect appropriate. no new focal deficit Gait not checked due to patient safety concerns  Vitals:   03/16/20 0800 03/16/20 1149 03/16/20 1218 03/16/20 1700  BP: (!) 143/80  139/65 133/85  Pulse: (!) 49  (!) 55   Resp:   18 19  Temp: 97.8 F (36.6 C)  98 F (36.7 C) 98 F (36.7 C)  TempSrc: Oral  Oral   SpO2: 93% 94% 96% 98%  Weight:      Height:        Intake/Output Summary (Last 24 hours) at 03/16/2020 1914 Last data filed at 03/16/2020 1425 Gross per 24 hour  Intake 720 ml  Output --  Net 720 ml   Filed Weights   03/14/20 0544 03/15/20 0355 03/16/20 0627  Weight: 87.5 kg 88 kg 84.1 kg    Data Reviewed: I have personally reviewed and interpreted daily labs, tele strips, imagings as discussed above. I reviewed all nursing notes, pharmacy notes, vitals, pertinent old records I have discussed plan of care as described above with RN and patient/family.  CBC: Recent Labs  Lab 03/12/20 1351 03/13/20 0358 03/14/20 0407 03/15/20 0523 03/16/20 0618  WBC 10.8* 8.7 7.1 6.4 9.7  NEUTROABS 9.3*  --   --   --   --   HGB 11.9* 11.3* 11.0* 12.5 13.0  HCT 36.6 34.8* 34.1* 37.2 37.8  MCV 101.9* 102.1* 100.0 97.9 97.7  PLT 258 216 228 276 292   Basic Metabolic Panel: Recent Labs  Lab 03/12/20 1351 03/13/20 0751 03/14/20 0407 03/15/20 0523 03/16/20 0618  NA 135 136 135 135 137  K 5.6* 4.6 4.8 4.6 4.3  CL 101 102 101 99 103  CO2 23 24 25 24 25   GLUCOSE 133* 106* 97 176* 164*  BUN 38* 38* 39* 50* 60*  CREATININE 1.83* 1.64* 1.53* 1.67* 1.38*  CALCIUM 9.4 9.0 9.5 9.8 9.9  MG  --  2.1 2.2  --  2.7*    Studies: No results found.  Scheduled Meds:  apixaban  5 mg Oral BID   benzonatate  100 mg Oral TID   colchicine  0.6 mg Oral Daily   docusate sodium  100 mg Oral BID   flecainide  100 mg Oral BID   FLUoxetine  20 mg Oral Daily   fluticasone  2 spray Each Nare QPM   lidocaine  1 patch Transdermal Q24H   methylPREDNISolone  (SOLU-MEDROL) injection  80 mg Intravenous BID   montelukast  10 mg Oral QHS   oxybutynin  5 mg Oral QPM   pantoprazole  40 mg Oral Daily   pramipexole  0.25 mg Oral QID   sodium chloride flush  10 mL Intravenous Q12H   sucralfate  1 g Oral TID   traZODone  100 mg Oral QHS   Continuous Infusions:  levofloxacin (LEVAQUIN) IV 750 mg (03/16/20 1631)   PRN Meds: acetaminophen **OR** acetaminophen, albuterol, ALPRAZolam, carbamazepine, ondansetron **OR** ondansetron (ZOFRAN) IV, oxyCODONE, tiZANidine, traMADol  Time spent: 35 minutes  Author: 03/18/20, MD [facility] 03/16/2020 7:14 PM  To reach On-call, see care teams to locate the attending and reach out via [email or web address]/[email or web address]. Between 7PM-7AM, please contact night-coverage If you still have difficulty reaching the  attending provider, please page the [facility] (Director on Call) for [facility] on amion for assistance.

## 2020-03-17 LAB — CBC
HCT: 38.7 % (ref 36.0–46.0)
Hemoglobin: 12.7 g/dL (ref 12.0–15.0)
MCH: 32.6 pg (ref 26.0–34.0)
MCHC: 32.8 g/dL (ref 30.0–36.0)
MCV: 99.2 fL (ref 80.0–100.0)
Platelets: 299 10*3/uL (ref 150–400)
RBC: 3.9 MIL/uL (ref 3.87–5.11)
RDW: 13.4 % (ref 11.5–15.5)
WBC: 10.4 10*3/uL (ref 4.0–10.5)
nRBC: 0 % (ref 0.0–0.2)

## 2020-03-17 LAB — BASIC METABOLIC PANEL
Anion gap: 10 (ref 5–15)
BUN: 61 mg/dL — ABNORMAL HIGH (ref 8–23)
CO2: 23 mmol/L (ref 22–32)
Calcium: 10.1 mg/dL (ref 8.9–10.3)
Chloride: 102 mmol/L (ref 98–111)
Creatinine, Ser: 1.41 mg/dL — ABNORMAL HIGH (ref 0.44–1.00)
GFR, Estimated: 38 mL/min — ABNORMAL LOW (ref >60)
Glucose, Bld: 157 mg/dL — ABNORMAL HIGH (ref 70–99)
Potassium: 4.5 mmol/L (ref 3.5–5.1)
Sodium: 135 mmol/L (ref 135–145)

## 2020-03-17 LAB — CULTURE, BLOOD (SINGLE)
Culture: NO GROWTH
Special Requests: ADEQUATE

## 2020-03-17 LAB — C-REACTIVE PROTEIN: CRP: 1.6 mg/dL — ABNORMAL HIGH (ref <1.0)

## 2020-03-17 MED ORDER — NYSTATIN 100000 UNIT/ML MT SUSP
5.0000 mL | Freq: Four times a day (QID) | OROMUCOSAL | Status: DC
  Administered 2020-03-17 – 2020-03-19 (×9): 500000 [IU] via ORAL
  Filled 2020-03-17 (×8): qty 5

## 2020-03-17 NOTE — Plan of Care (Signed)
  Problem: Education: Goal: Knowledge of General Education information will improve Description: Including pain rating scale, medication(s)/side effects and non-pharmacologic comfort measures 03/17/2020 1044 by Ansel Bong, RN Outcome: Progressing 03/17/2020 1044 by Ansel Bong, RN Outcome: Progressing   Problem: Health Behavior/Discharge Planning: Goal: Ability to manage health-related needs will improve 03/17/2020 1044 by Ansel Bong, RN Outcome: Progressing 03/17/2020 1044 by Ansel Bong, RN Outcome: Progressing   Problem: Clinical Measurements: Goal: Ability to maintain clinical measurements within normal limits will improve 03/17/2020 1044 by Ansel Bong, RN Outcome: Progressing 03/17/2020 1044 by Ansel Bong, RN Outcome: Progressing Goal: Will remain free from infection 03/17/2020 1044 by Ansel Bong, RN Outcome: Progressing 03/17/2020 1044 by Ansel Bong, RN Outcome: Progressing Goal: Diagnostic test results will improve 03/17/2020 1044 by Ansel Bong, RN Outcome: Progressing 03/17/2020 1044 by Ansel Bong, RN Outcome: Progressing Goal: Respiratory complications will improve 03/17/2020 1044 by Ansel Bong, RN Outcome: Progressing 03/17/2020 1044 by Ansel Bong, RN Outcome: Progressing Goal: Cardiovascular complication will be avoided 03/17/2020 1044 by Ansel Bong, RN Outcome: Progressing 03/17/2020 1044 by Ansel Bong, RN Outcome: Progressing   Problem: Activity: Goal: Risk for activity intolerance will decrease 03/17/2020 1044 by Ansel Bong, RN Outcome: Progressing 03/17/2020 1044 by Ansel Bong, RN Outcome: Progressing   Problem: Nutrition: Goal: Adequate nutrition will be maintained 03/17/2020 1044 by Ansel Bong, RN Outcome: Progressing 03/17/2020 1044 by Ansel Bong, RN Outcome: Progressing   Problem: Coping: Goal: Level of anxiety will decrease 03/17/2020 1044 by Ansel Bong, RN Outcome:  Progressing 03/17/2020 1044 by Ansel Bong, RN Outcome: Progressing   Problem: Elimination: Goal: Will not experience complications related to bowel motility 03/17/2020 1044 by Ansel Bong, RN Outcome: Progressing 03/17/2020 1044 by Ansel Bong, RN Outcome: Progressing Goal: Will not experience complications related to urinary retention 03/17/2020 1044 by Ansel Bong, RN Outcome: Progressing 03/17/2020 1044 by Ansel Bong, RN Outcome: Progressing   Problem: Pain Managment: Goal: General experience of comfort will improve 03/17/2020 1044 by Ansel Bong, RN Outcome: Progressing 03/17/2020 1044 by Ansel Bong, RN Outcome: Progressing   Problem: Safety: Goal: Ability to remain free from injury will improve 03/17/2020 1044 by Ansel Bong, RN Outcome: Progressing 03/17/2020 1044 by Ansel Bong, RN Outcome: Progressing   Problem: Skin Integrity: Goal: Risk for impaired skin integrity will decrease 03/17/2020 1044 by Ansel Bong, RN Outcome: Progressing 03/17/2020 1044 by Ansel Bong, RN Outcome: Progressing   Problem: Activity: Goal: Ability to tolerate increased activity will improve 03/17/2020 1044 by Ansel Bong, RN Outcome: Progressing 03/17/2020 1044 by Ansel Bong, RN Outcome: Progressing   Problem: Clinical Measurements: Goal: Ability to maintain a body temperature in the normal range will improve 03/17/2020 1044 by Ansel Bong, RN Outcome: Progressing 03/17/2020 1044 by Ansel Bong, RN Outcome: Progressing   Problem: Respiratory: Goal: Ability to maintain adequate ventilation will improve 03/17/2020 1044 by Ansel Bong, RN Outcome: Progressing 03/17/2020 1044 by Ansel Bong, RN Outcome: Progressing Goal: Ability to maintain a clear airway will improve 03/17/2020 1044 by Ansel Bong, RN Outcome: Progressing 03/17/2020 1044 by Ansel Bong, RN Outcome: Progressing

## 2020-03-17 NOTE — Progress Notes (Signed)
[facility] Progress Note  Patient: [name]    HYW:[id number]  DOA: 03/12/2020     Date of Service: the patient was seen and examined on 03/17/2020  Brief hospital course: [name] a 79 y.o.femalewith Past medical history ofchronic A. fib, anxiety, chronic diastolic CHF, GERD, HTN,OSA, obesity. Presents with severe hypoxia requiring heated high flow nasal cannula for now.  Likely from community-acquired pneumonia as well as CHF. Currently plan is continue current care for pneumonia and hypoxia.  Assessment and Plan: Acute respiratory failure with hypoxia (HCC), POA Sepsis POA due to Community-acquired pneumonia. Likely viral. Meeting SIRS criteria with tachycardia, leukocytosis with organ damage and elevated troponin. 75% on room air on admission, currently still occasionally drops to 89% on heated high flow at 40 L/min comfortable and is able to talk in full sentences. COVID-19 test x2 negative. MRSA PCR negative therefore antibiotic switch only to Levaquin. Follow-up on cultures.  So far no growth. Continue with antibiotics.  Last day of Levaquin tomorrow on 03/18/2020. CT chest negative for any effusion or any other acute normality. Multifocal pneumonia as well as chronic bronchiectasis.  Add steroids.  Acute on chronic diastolic CHF BNP elevated.   Treated with IV Lasix  Switch to p.o. Echocardiogram showed preserved EF 65 to 70% with grade 2 diastolic dysfunction and LVH.  Chronic Atrial fibrillation (HCC) Elevated troponin Continue home regimen. Briefly was on IV heparin. Troponin less likely consistent with ACS. More likely demand ischemia. Echocardiogram also shows preserved EF without any wall motion abnormality. Back on Eliquis.  GERD (gastroesophageal reflux disease) Continue PPI.  AKI (acute kidney injury) on chronic kidney disease stage IIIb (HCC) Baseline renal function 1.7 On presentation serum creatinine 1.8.  Currently trending up to  1.6. Likely cardiorenal hemodynamics. Patient missed her 2 doses of Lasix. Mild worsening of renal function today.  Therefore we will be holding Lasix.  Hyperkalemia Mild.  Resolved improvement with diuresis.  Macrocytosis Monitor for now. B12 normal.  Acute metabolic encephalopathy In the setting of severe hypoxemia. Patient back to baseline per family.  Goals of care conversation. Patient wants to remain full code. Patient has a healthcare power of attorney. Does not want to have a feeding tube. Patient does not want to remain on life support should she does not have a good quality of life but is dependent on machine to survive.  Diet: Cardiac diet DVT Prophylaxis:   Place TED hose Start: 03/14/20 1007 apixaban (ELIQUIS) tablet 5 mg    Advance goals of care discussion: Full code  Family Communication: no family was present at bedside, at the time of interview.   Disposition:  Status is: Inpatient  Remains inpatient appropriate because:Hemodynamically unstable  Dispo: The patient is from: Home              Anticipated d/c is to: Home              Anticipated d/c date is: 3 days              Patient currently is not medically stable to d/c.  Subjective: Shortness of breath present.  No nausea no vomiting.  Still has cough.  No bleeding.  Physical Exam:  General: Appear in mild distress, no Rash; Oral Mucosa Clear, moist. no Abnormal Neck Mass Or lumps, Conjunctiva normal  Cardiovascular: S1 and S2 Present, no Murmur, Respiratory: increased respiratory effort, Bilateral Air entry present and bilateral  Crackles, no wheezes Abdomen: Bowel Sound present, Soft and no tenderness  Extremities: bilateral  Pedal edema Neurology: alert and oriented to time, place, and person affect appropriate. no new focal deficit Gait not checked due to patient safety concerns  Vitals:   03/17/20 0825 03/17/20 0945 03/17/20 1125 03/17/20 1624  BP: (!) 163/69  (!) 162/64 (!) 144/68   Pulse: (!) 54  (!) 48 (!) 47  Resp: 19  19 19   Temp: 97.8 F (36.6 C)  98 F (36.7 C) 98 F (36.7 C)  TempSrc: Oral  Oral Oral  SpO2: 95% 97% 95% 93%  Weight:      Height:        Intake/Output Summary (Last 24 hours) at 03/17/2020 1741 Last data filed at 03/17/2020 1100 Gross per 24 hour  Intake 360 ml  Output 100 ml  Net 260 ml   Filed Weights   03/15/20 0355 03/16/20 0627 03/17/20 0500  Weight: 88 kg 84.1 kg 88.4 kg    Data Reviewed: I have personally reviewed and interpreted daily labs, tele strips, imagings as discussed above. I reviewed all nursing notes, pharmacy notes, vitals, pertinent old records I have discussed plan of care as described above with RN and patient/family.  CBC: Recent Labs  Lab 03/12/20 1351 03/12/20 1351 03/13/20 0358 03/14/20 0407 03/15/20 0523 03/16/20 0618 03/17/20 0539  WBC 10.8*   < > 8.7 7.1 6.4 9.7 10.4  NEUTROABS 9.3*  --   --   --   --   --   --   HGB 11.9*   < > 11.3* 11.0* 12.5 13.0 12.7  HCT 36.6   < > 34.8* 34.1* 37.2 37.8 38.7  MCV 101.9*   < > 102.1* 100.0 97.9 97.7 99.2  PLT 258   < > 216 228 276 292 299   < > = values in this interval not displayed.   Basic Metabolic Panel: Recent Labs  Lab 03/13/20 0751 03/14/20 0407 03/15/20 0523 03/16/20 0618 03/17/20 0539  NA 136 135 135 137 135  K 4.6 4.8 4.6 4.3 4.5  CL 102 101 99 103 102  CO2 24 25 24 25 23   GLUCOSE 106* 97 176* 164* 157*  BUN 38* 39* 50* 60* 61*  CREATININE 1.64* 1.53* 1.67* 1.38* 1.41*  CALCIUM 9.0 9.5 9.8 9.9 10.1  MG 2.1 2.2  --  2.7*  --     Studies: No results found.  Scheduled Meds:  apixaban  5 mg Oral BID   benzonatate  100 mg Oral TID   colchicine  0.6 mg Oral Daily   docusate sodium  100 mg Oral BID   flecainide  100 mg Oral BID   FLUoxetine  20 mg Oral Daily   fluticasone  2 spray Each Nare QPM   lidocaine  1 patch Transdermal Q24H   methylPREDNISolone (SOLU-MEDROL) injection  80 mg Intravenous BID   montelukast  10  mg Oral QHS   nystatin  5 mL Oral QID   oxybutynin  5 mg Oral QPM   pantoprazole  40 mg Oral Daily   pramipexole  0.25 mg Oral QID   sodium chloride flush  10 mL Intravenous Q12H   sucralfate  1 g Oral TID   traZODone  100 mg Oral QHS   Continuous Infusions:  levofloxacin (LEVAQUIN) IV 750 mg (03/16/20 1631)   PRN Meds: acetaminophen **OR** acetaminophen, albuterol, ALPRAZolam, carbamazepine, ondansetron **OR** ondansetron (ZOFRAN) IV, oxyCODONE, tiZANidine, traMADol  Time spent: 35 minutes  Author: , MD [facility] 03/17/2020 5:41 PM  To reach On-call, see  care teams to locate the attending and reach out via [email or web address]. Between 7PM-7AM, please contact night-coverage If you still have difficulty reaching the attending provider, please page the [facility] (Director on Call) for [facility] on amion for assistance.

## 2020-03-17 NOTE — Plan of Care (Signed)
  Problem: Education: Goal: Knowledge of General Education information will improve Description: Including pain rating scale, medication(s)/side effects and non-pharmacologic comfort measures Outcome: Progressing   Problem: Clinical Measurements: Goal: Respiratory complications will improve Outcome: Progressing   Problem: Activity: Goal: Ability to tolerate increased activity will improve Outcome: Progressing   Problem: Safety: Goal: Ability to remain free from injury will improve Outcome: Progressing   Problem: Respiratory: Goal: Ability to maintain adequate ventilation will improve Outcome: Progressing
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
## 2020-03-17 NOTE — Progress Notes (Signed)
Mobility Specialist - Progress Note   03/17/20 1500  Mobility  Activity Ambulated in room;Transferred to/from [facility]  Level of Assistance Standby assist, set-up cues, supervision of patient - no hands on  Assistive Device None  Distance Ambulated (ft) 75 ft  Mobility Response Tolerated well  Mobility performed by Mobility specialist  $Mobility charge 1 Mobility    Pre-mobility: 57 HR, 95% SpO2 During mobility: 101 HR, 92% SpO2 Post-mobility: 70 HR, 94% SpO2   Pt was lying in bed upon arrival with friend/POA present in room. Pt agreed to session. Pt was utilizing 6L [place] O2 which would be utilized throughout session. Pt stated she had not been on oxygen prior to admission. Pt was very pleasant and energetic throughout session. Pt was able to get EOB with SBA and stood with minA. Pt ambulated 3' to [facility] in hopes of having a BM, attempt was unsuccessful. Mobility assisted with hygiene. Pt continued to ambulate an additional ~70' in room without AD. No LOB noted, however, pt did c/o feeling "wobbly" in LE. Max HR this session was 103 bpm, with O2 desat to 92%. Pt denied SOB. Pt was SBA returning EOB-supine. Overall, pt tolerated session well. Pt was left in bed with all needs in reach and alarm set. Nurse was notified of performance.    Filiberto Pinks Mobility Specialist 03/17/20, 3:30 PM

## 2020-03-18 ENCOUNTER — Encounter: Payer: Self-pay | Admitting: Internal Medicine

## 2020-03-18 DIAGNOSIS — I5033 Acute on chronic diastolic (congestive) heart failure: Secondary | ICD-10-CM

## 2020-03-18 DIAGNOSIS — A419 Sepsis, unspecified organism: Principal | ICD-10-CM

## 2020-03-18 DIAGNOSIS — R6520 Severe sepsis without septic shock: Secondary | ICD-10-CM

## 2020-03-18 DIAGNOSIS — N179 Acute kidney failure, unspecified: Secondary | ICD-10-CM

## 2020-03-18 LAB — CBC
HCT: 39.6 % (ref 36.0–46.0)
Hemoglobin: 13.2 g/dL (ref 12.0–15.0)
MCH: 32.9 pg (ref 26.0–34.0)
MCHC: 33.3 g/dL (ref 30.0–36.0)
MCV: 98.8 fL (ref 80.0–100.0)
Platelets: 300 10*3/uL (ref 150–400)
RBC: 4.01 MIL/uL (ref 3.87–5.11)
RDW: 13.3 % (ref 11.5–15.5)
WBC: 9 10*3/uL (ref 4.0–10.5)
nRBC: 0 % (ref 0.0–0.2)

## 2020-03-18 LAB — BASIC METABOLIC PANEL
Anion gap: 10 (ref 5–15)
BUN: 51 mg/dL — ABNORMAL HIGH (ref 8–23)
CO2: 23 mmol/L (ref 22–32)
Calcium: 10.2 mg/dL (ref 8.9–10.3)
Chloride: 105 mmol/L (ref 98–111)
Creatinine, Ser: 1.15 mg/dL — ABNORMAL HIGH (ref 0.44–1.00)
GFR, Estimated: 49 mL/min — ABNORMAL LOW (ref >60)
Glucose, Bld: 146 mg/dL — ABNORMAL HIGH (ref 70–99)
Potassium: 4.5 mmol/L (ref 3.5–5.1)
Sodium: 138 mmol/L (ref 135–145)

## 2020-03-18 LAB — C-REACTIVE PROTEIN: CRP: 0.7 mg/dL (ref <1.0)

## 2020-03-18 MED ORDER — FUROSEMIDE 20 MG PO TABS
20.0000 mg | ORAL_TABLET | Freq: Every day | ORAL | Status: DC
  Administered 2020-03-18 – 2020-03-19 (×2): 20 mg via ORAL
  Filled 2020-03-18 (×2): qty 1

## 2020-03-18 NOTE — Progress Notes (Addendum)
Progress Note    SHAHIRA FISKE  FOY:[id number] DOB: 1940/06/17  DOA: 03/12/2020 PCP: Danella Penton, MD      Brief Narrative:    Medical records reviewed and are as summarized below:  [name] is a 79 y.o. female with a past medical history ofchronic A. fib, anxiety, chronic diastolic CHF, GERD, HTN,OSA, obesity, recent dental procedure 2 days prior to admission.  She presented to the hospital because of fever and decreased oxygen saturation.  Oxygen saturation was 75% on room air.      Assessment/Plan:   Principal Problem:   Acute respiratory failure with hypoxia (HCC) Active Problems:   Atrial fibrillation (HCC)   Acute on chronic diastolic CHF (congestive heart failure) (HCC)   Hypertension   Sepsis (HCC)   GERD (gastroesophageal reflux disease)   AKI (acute kidney injury) (HCC)   Elevated troponin   Hyperkalemia   Macrocytosis   Acute metabolic encephalopathy    Body mass index is 31.06 kg/m.  (Obesity)  Severe sepsis (present on admission) secondary to community-acquired pneumonia complicated by AKI, acute hypoxic respiratory failure and acute metabolic encephalopathy: Patient will complete Levaquin today.  Acute hypoxic respiratory failure: She is on 6 L/min oxygen via nasal cannula.  Taper off as able.  Acute on chronic diastolic CHF: Resume oral Lasix today.  2D echo showed EF of 65 to 70%, grade 2 diastolic dysfunction LVH.  Chronic atrial fibrillation: Continue flecainide and Eliquis.  Elevated troponin was likely due to demand ischemia.  AKI: Improved  Acute metabolic encephalopathy: Improved    Diet Order            Diet Heart Room service appropriate? Yes; Fluid consistency: Thin  Diet effective now                    Consultants:  None  Procedures:  None    Medications:   . apixaban  5 mg Oral BID  . benzonatate  100 mg Oral TID  . colchicine  0.6 mg Oral Daily  . docusate sodium  100 mg Oral BID  . flecainide   100 mg Oral BID  . FLUoxetine  20 mg Oral Daily  . fluticasone  2 spray Each Nare QPM  . lidocaine  1 patch Transdermal Q24H  . methylPREDNISolone (SOLU-MEDROL) injection  80 mg Intravenous BID  . montelukast  10 mg Oral QHS  . nystatin  5 mL Oral QID  . oxybutynin  5 mg Oral QPM  . pantoprazole  40 mg Oral Daily  . pramipexole  0.25 mg Oral QID  . sodium chloride flush  10 mL Intravenous Q12H  . sucralfate  1 g Oral TID  . traZODone  100 mg Oral QHS   Continuous Infusions: . levofloxacin (LEVAQUIN) IV 750 mg (03/16/20 1631)     Anti-infectives (From admission, onward)   Start     Dose/Rate Route Frequency Ordered Stop   03/14/20 1600  levofloxacin (LEVAQUIN) IVPB 750 mg        750 mg 100 mL/hr over 90 Minutes Intravenous Every 48 hours 03/12/20 1733 03/20/20 1559   03/13/20 1700  vancomycin (VANCOCIN) IVPB 1000 mg/200 mL premix  Status:  Discontinued        1,000 mg 200 mL/hr over 60 Minutes Intravenous Every 24 hours 03/12/20 1640 03/13/20 0717   03/12/20 1700  vancomycin (VANCOREADY) IVPB 2000 mg/400 mL        2,000 mg 200 mL/hr over 120 Minutes  Intravenous  Once 03/12/20 1633 03/12/20 1943   03/12/20 1615  levofloxacin (LEVAQUIN) IVPB 750 mg        750 mg 100 mL/hr over 90 Minutes Intravenous  Once 03/12/20 1606 03/12/20 1819             Family Communication/Anticipated D/C date and plan/Code Status   DVT prophylaxis: Place TED hose Start: 03/14/20 1007 apixaban (ELIQUIS) tablet 5 mg     Code Status: Full Code  Family Communication: Discussed with patient Disposition Plan:    Status is: Inpatient  Remains inpatient appropriate because:Unsafe d/c plan and hypoxia   Dispo: The patient is from: Home              Anticipated d/c is to: Home              Anticipated d/c date is: 2 days              Patient currently is not medically stable to d/c.           Subjective:   Interval events noted.  No shortness of breath or chest pain.  She is on  6 L/min oxygen via nasal cannula.  Objective:    Vitals:   03/18/20 0157 03/18/20 0340 03/18/20 0758 03/18/20 1149  BP:  123/64 (!) 147/67 140/61  Pulse:  (!) 55 (!) 50 60  Resp:  (!) 21 18 18   Temp:  97.9 F (36.6 C) 97.9 F (36.6 C) 98 F (36.7 C)  TempSrc:  Oral Oral Oral  SpO2:  92% 91% 96%  Weight: 87.3 kg     Height:       No data found.   Intake/Output Summary (Last 24 hours) at 03/18/2020 1505 Last data filed at 03/18/2020 1100 Gross per 24 hour  Intake 120 ml  Output 350 ml  Net -230 ml   Filed Weights   03/16/20 0627 03/17/20 0500 03/18/20 0157  Weight: 84.1 kg 88.4 kg 87.3 kg    Exam:  GEN: NAD SKIN: Warm and dry EYES: EOMI ENT: MMM CV: RRR PULM: CTA B ABD: soft, ND, NT, +BS CNS: AAO x 3, non focal EXT: No edema or tenderness   Data Reviewed:   I have personally reviewed following labs and imaging studies:  Labs: Labs show the following:   Basic Metabolic Panel: Recent Labs  Lab 03/13/20 0751 03/13/20 0751 03/14/20 0407 03/14/20 0407 03/15/20 0523 03/15/20 0523 03/16/20 0618 03/16/20 0618 03/17/20 0539 03/18/20 0656  NA 136   < > 135  --  135  --  137  --  135 138  K 4.6   < > 4.8   < > 4.6   < > 4.3   < > 4.5 4.5  CL 102   < > 101  --  99  --  103  --  102 105  CO2 24   < > 25  --  24  --  25  --  23 23  GLUCOSE 106*   < > 97  --  176*  --  164*  --  157* 146*  BUN 38*   < > 39*  --  50*  --  60*  --  61* 51*  CREATININE 1.64*   < > 1.53*  --  1.67*  --  1.38*  --  1.41* 1.15*  CALCIUM 9.0   < > 9.5  --  9.8  --  9.9  --  10.1 10.2  MG 2.1  --  2.2  --   --   --  2.7*  --   --   --    < > = values in this interval not displayed.   GFR Estimated Creatinine Clearance: 45.6 mL/min (A) (by C-G formula based on SCr of 1.15 mg/dL (H)). Liver Function Tests: Recent Labs  Lab 03/12/20 1351  AST 25  ALT 25  ALKPHOS 72  BILITOT 1.0  PROT 6.3*  ALBUMIN 3.1*   No results for input(s): LIPASE, AMYLASE in the last 168 hours. No  results for input(s): AMMONIA in the last 168 hours. Coagulation profile Recent Labs  Lab 03/12/20 1351  INR 1.7*    CBC: Recent Labs  Lab 03/12/20 1351 03/13/20 0358 03/14/20 0407 03/15/20 0523 03/16/20 0618 03/17/20 0539 03/18/20 0656  WBC 10.8*   < > 7.1 6.4 9.7 10.4 9.0  NEUTROABS 9.3*  --   --   --   --   --   --   HGB 11.9*   < > 11.0* 12.5 13.0 12.7 13.2  HCT 36.6   < > 34.1* 37.2 37.8 38.7 39.6  MCV 101.9*   < > 100.0 97.9 97.7 99.2 98.8  PLT 258   < > 228 276 292 299 300   < > = values in this interval not displayed.   Cardiac Enzymes: No results for input(s): CKTOTAL, CKMB, CKMBINDEX, TROPONINI in the last 168 hours. BNP (last 3 results) No results for input(s): PROBNP in the last 8760 hours. CBG: No results for input(s): GLUCAP in the last 168 hours. D-Dimer: No results for input(s): DDIMER in the last 72 hours. Hgb A1c: No results for input(s): HGBA1C in the last 72 hours. Lipid Profile: No results for input(s): CHOL, HDL, LDLCALC, TRIG, CHOLHDL, LDLDIRECT in the last 72 hours. Thyroid function studies: No results for input(s): TSH, T4TOTAL, T3FREE, THYROIDAB in the last 72 hours.  Invalid input(s): FREET3 Anemia work up: No results for input(s): VITAMINB12, FOLATE, FERRITIN, TIBC, IRON, RETICCTPCT in the last 72 hours. Sepsis Labs: Recent Labs  Lab 03/12/20 1351 03/12/20 1659 03/13/20 0358 03/15/20 0523 03/16/20 0618 03/17/20 0539 03/18/20 0656  PROCALCITON 0.13  --   --   --   --   --   --   WBC 10.8*  --    < > 6.4 9.7 10.4 9.0  LATICACIDVEN 2.2* 1.6  --   --   --   --   --    < > = values in this interval not displayed.    Microbiology Recent Results (from the past 240 hour(s))  Blood culture (routine single)     Status: None   Collection Time: 03/12/20  1:51 PM   Specimen: BLOOD  Result Value Ref Range Status   Specimen Description BLOOD BLOOD LEFT FOREARM  Final   Special Requests   Final    BOTTLES DRAWN AEROBIC AND ANAEROBIC  Blood Culture adequate volume   Culture   Final    NO GROWTH 5 DAYS Performed at [facility], [address] [place], [place] [address]    Report Status 03/17/2020 FINAL  Final  Respiratory Panel by RT PCR (Flu A&B, Covid) - Nasopharyngeal Swab     Status: None   Collection Time: 03/12/20  1:51 PM   Specimen: Nasopharyngeal Swab  Result Value Ref Range Status   SARS Coronavirus 2 by RT PCR NEGATIVE NEGATIVE Final    Comment: (NOTE) SARS-CoV-2 target nucleic acids are NOT DETECTED.  The SARS-CoV-2 RNA is  generally detectable in upper respiratoy specimens during the acute phase of infection. The lowest concentration of SARS-CoV-2 viral copies this assay can detect is 131 copies/mL. A negative result does not preclude SARS-Cov-2 infection and should not be used as the sole basis for treatment or other patient management decisions. A negative result may occur with  improper specimen collection/handling, submission of specimen other than nasopharyngeal swab, presence of viral mutation(s) within the areas targeted by this assay, and inadequate number of viral copies (<131 copies/mL). A negative result must be combined with clinical observations, patient history, and epidemiological information. The expected result is Negative.  Fact Sheet for Patients:  https://www.moore.com/https://www.fda.gov/media/142436/download  Fact Sheet for Healthcare Providers:  https://www.young.biz/https://www.fda.gov/media/142435/download  This test is no t yet approved or cleared by the [place] FDA and  has been authorized for detection and/or diagnosis of SARS-CoV-2 by FDA under an Emergency Use Authorization (EUA). This EUA will remain  in effect (meaning this test can be used) for the duration of the COVID-19 declaration under Section 564(b)(1) of the Act, 21 U.S.C. section 360bbb-3(b)(1), unless the authorization is terminated or revoked sooner.     Influenza A by PCR NEGATIVE NEGATIVE Final   Influenza B by PCR NEGATIVE  NEGATIVE Final    Comment: (NOTE) The Xpert Xpress SARS-CoV-2/FLU/RSV assay is intended as an aid in  the diagnosis of influenza from Nasopharyngeal swab specimens and  should not be used as a sole basis for treatment. Nasal washings and  aspirates are unacceptable for Xpert Xpress SARS-CoV-2/FLU/RSV  testing.  Fact Sheet for Patients: https://www.moore.com/https://www.fda.gov/media/142436/download  Fact Sheet for Healthcare Providers: https://www.young.biz/https://www.fda.gov/media/142435/download  This test is not yet approved or cleared by the [place] FDA and  has been authorized for detection and/or diagnosis of SARS-CoV-2 by  FDA under an Emergency Use Authorization (EUA). This EUA will remain  in effect (meaning this test can be used) for the duration of the  Covid-19 declaration under Section 564(b)(1) of the Act, 21  U.S.C. section 360bbb-3(b)(1), unless the authorization is  terminated or revoked. Performed at [facility], [address] [place], [place] [address]   Respiratory Panel by RT PCR (Flu A&B, Covid) - Nasopharyngeal Swab     Status: None   Collection Time: 03/12/20  4:59 PM   Specimen: Nasopharyngeal Swab  Result Value Ref Range Status   SARS Coronavirus 2 by RT PCR NEGATIVE NEGATIVE Final    Comment: (NOTE) SARS-CoV-2 target nucleic acids are NOT DETECTED.  The SARS-CoV-2 RNA is generally detectable in upper respiratoy specimens during the acute phase of infection. The lowest concentration of SARS-CoV-2 viral copies this assay can detect is 131 copies/mL. A negative result does not preclude SARS-Cov-2 infection and should not be used as the sole basis for treatment or other patient management decisions. A negative result may occur with  improper specimen collection/handling, submission of specimen other than nasopharyngeal swab, presence of viral mutation(s) within the areas targeted by this assay, and inadequate number of viral copies (<131 copies/mL). A negative result must be  combined with clinical observations, patient history, and epidemiological information. The expected result is Negative.  Fact Sheet for Patients:  https://www.moore.com/https://www.fda.gov/media/142436/download  Fact Sheet for Healthcare Providers:  https://www.young.biz/https://www.fda.gov/media/142435/download  This test is no t yet approved or cleared by the [place] FDA and  has been authorized for detection and/or diagnosis of SARS-CoV-2 by FDA under an Emergency Use Authorization (EUA). This EUA will remain  in effect (meaning this test can be used) for the duration of  the COVID-19 declaration under Section 564(b)(1) of the Act, 21 U.S.C. section 360bbb-3(b)(1), unless the authorization is terminated or revoked sooner.     Influenza A by PCR NEGATIVE NEGATIVE Final   Influenza B by PCR NEGATIVE NEGATIVE Final    Comment: (NOTE) The Xpert Xpress SARS-CoV-2/FLU/RSV assay is intended as an aid in  the diagnosis of influenza from Nasopharyngeal swab specimens and  should not be used as a sole basis for treatment. Nasal washings and  aspirates are unacceptable for Xpert Xpress SARS-CoV-2/FLU/RSV  testing.  Fact Sheet for Patients: https://www.moore.com/  Fact Sheet for Healthcare Providers: https://www.young.biz/  This test is not yet approved or cleared by the [place] FDA and  has been authorized for detection and/or diagnosis of SARS-CoV-2 by  FDA under an Emergency Use Authorization (EUA). This EUA will remain  in effect (meaning this test can be used) for the duration of the  Covid-19 declaration under Section 564(b)(1) of the Act, 21  U.S.C. section 360bbb-3(b)(1), unless the authorization is  terminated or revoked. Performed at [facility], [address], [place], [place] [address]   Urine culture     Status: Abnormal   Collection Time: 03/12/20  9:52 PM   Specimen: In/Out Cath Urine  Result Value Ref Range Status   Specimen Description   Final      IN/OUT CATH URINE Performed at [facility], [address] [place], [place] [address]    Special Requests   Final    NONE Performed at [facility], [address] [place], [place] [address]    Culture MULTIPLE SPECIES PRESENT, SUGGEST RECOLLECTION (A)  Final   Report Status 03/14/2020 FINAL  Final  MRSA PCR Screening     Status: None   Collection Time: 03/12/20  9:52 PM   Specimen: Nasopharyngeal  Result Value Ref Range Status   MRSA by PCR NEGATIVE NEGATIVE Final    Comment:        The GeneXpert MRSA Assay (FDA approved for NASAL specimens only), is one component of a comprehensive MRSA colonization surveillance program. It is not intended to diagnose MRSA infection nor to guide or monitor treatment for MRSA infections. Performed at [facility], [address] [place], [place] [address]     Procedures and diagnostic studies:  No results found.             LOS: 6 days   Maily Debarge  [facility]   Pager on [email or web address]. If 7PM-7AM, please contact night-coverage at [email or web address]     03/18/2020, 3:05 PM

## 2020-03-18 NOTE — Progress Notes (Signed)
Physical Therapy Treatment Patient Details Name: [name] MRN: [id number] DOB: Dec 08, 1940 Today's Date: 03/18/2020    History of Present Illness [name] is a 79 y.o. female with Past medical history of chronic A. fib, anxiety, chronic diastolic CHF, GERD, HTN, OSA, obesity.  Presents with severe hypoxia requiring heated high flow nasal cannula for now.  Likely from community-acquired pneumonia as well as CHF.    PT Comments    Pt was pleasant and motivated to participate during the session. Throughout the session the pt was on 6L O2. Pt reported that they needed to use the restroom. Pt performed bed mobility Mod I and performed STS with Min guard to RW with no LOB. Pt was able to ambulate 7' to toilet and transfer to standard height toilet with Min guard. Pt was able to transfer from toilet with Min guard and ambulate 7' back to chair. Pt's SpO2 following ambulation was 93% and quickly rose back to 97%. Pt performed multiple STS for hygiene and dressing and SpO2 was 95%. Pt was able to ambulate 160' with 3 standing rest breaks to allow SpO2 to recover. Pt did not drop below 90% and pt was able to perform with RW and Min guard. Pt will benefit from HHPT services upon discharge to safely address deficits listed in patient problem list for decreased caregiver assistance and eventual return to PLOF.    Follow Up Recommendations  Home health PT;Supervision for mobility/OOB     Equipment Recommendations  None recommended by PT    Recommendations for Other Services       Precautions / Restrictions Restrictions Weight Bearing Restrictions: No    Mobility  Bed Mobility Overal bed mobility: Modified Independent             General bed mobility comments: HOB elevated and use of bed rails  Transfers Overall transfer level: Needs assistance   Transfers: Sit to/from Stand Sit to Stand: Min guard         General transfer comment: pt able to perform with ease and no  LOB  Ambulation/Gait Ambulation/Gait assistance: Min guard Gait Distance (Feet): 160 Feet Assistive device: Rolling walker (2 wheeled) Gait Pattern/deviations: WFL(Within Functional Limits);Decreased stride length     General Gait Details: Pt displays decreased stride length but overall good steady gait mechanics. Pt took 3 standing rest break to allow SpO2 recovery SpO2 remained above 90% the entire time during ambulation   Stairs             Wheelchair Mobility    Modified Rankin (Stroke Patients Only)       Balance Overall balance assessment: Needs assistance Sitting-balance support: Feet supported;No upper extremity supported Sitting balance-Leahy Scale: Normal     Standing balance support: Bilateral upper extremity supported Standing balance-Leahy Scale: Good Standing balance comment: pt able to perform static and dynamic balance with bilateral UE support. Pt able to navigate tight crowded enviornments and navigate bathroom                            Cognition Arousal/Alertness: Awake/alert Behavior During Therapy: WFL for tasks assessed/performed Overall Cognitive Status: Within Functional Limits for tasks assessed                                        Exercises Other Exercises Other Exercises: pt performs functional mobility to  nagiate tight crowded bathroom Other Exercises: pt performs multiple STS for dressing and hygine    General Comments        Pertinent Vitals/Pain Pain Assessment: No/denies pain Pain Intervention(s): Monitored during session    Home Living                      Prior Function            PT Goals (current goals can now be found in the care plan section) Acute Rehab PT Goals Patient Stated Goal: To return home PT Goal Formulation: With patient Time For Goal Achievement: 03/22/20 Potential to Achieve Goals: Good Progress towards PT goals: Progressing toward goals    Frequency     Min 2X/week      PT Plan Current plan remains appropriate    Co-evaluation              AM-PAC PT "6 Clicks" Mobility   Outcome Measure  Help needed turning from your back to your side while in a flat bed without using bedrails?: None Help needed moving from lying on your back to sitting on the side of a flat bed without using bedrails?: None Help needed moving to and from a bed to a chair (including a wheelchair)?: A Little Help needed standing up from a chair using your arms (e.g., wheelchair or bedside chair)?: A Little Help needed to walk in hospital room?: A Little Help needed climbing 3-5 steps with a railing? : A Lot 6 Click Score: 19    End of Session Equipment Utilized During Treatment: Gait belt;Oxygen Activity Tolerance: Patient tolerated treatment well Patient left: in chair;with call bell/phone within reach;with chair alarm set Nurse Communication: Mobility status;Other (comment) (nursing notified the pt needed purwick) PT Visit Diagnosis: Unsteadiness on feet (R26.81);Muscle weakness (generalized) (M62.81);Difficulty in walking, not elsewhere classified (R26.2);Dizziness and giddiness (R42)     Time: 4235-3614 PT Time Calculation (min) (ACUTE ONLY): 48 min  Charges:                        Nicolette Bang, SPT 03/18/20. 11:34 AM

## 2020-03-18 NOTE — Plan of Care (Signed)
  Problem: Education: Goal: Knowledge of General Education information will improve Description: Including pain rating scale, medication(s)/side effects and non-pharmacologic comfort measures 03/18/2020 1759 by Ansel Bong, RN Outcome: Progressing 03/18/2020 1759 by Ansel Bong, RN Outcome: Progressing   Problem: Health Behavior/Discharge Planning: Goal: Ability to manage health-related needs will improve 03/18/2020 1759 by Ansel Bong, RN Outcome: Progressing 03/18/2020 1759 by Ansel Bong, RN Outcome: Progressing   Problem: Clinical Measurements: Goal: Ability to maintain clinical measurements within normal limits will improve 03/18/2020 1759 by Ansel Bong, RN Outcome: Progressing 03/18/2020 1759 by Ansel Bong, RN Outcome: Progressing Goal: Will remain free from infection 03/18/2020 1759 by Ansel Bong, RN Outcome: Progressing 03/18/2020 1759 by Ansel Bong, RN Outcome: Progressing Goal: Diagnostic test results will improve 03/18/2020 1759 by Ansel Bong, RN Outcome: Progressing 03/18/2020 1759 by Ansel Bong, RN Outcome: Progressing Goal: Respiratory complications will improve 03/18/2020 1759 by Ansel Bong, RN Outcome: Progressing 03/18/2020 1759 by Ansel Bong, RN Outcome: Progressing Goal: Cardiovascular complication will be avoided 03/18/2020 1759 by Ansel Bong, RN Outcome: Progressing 03/18/2020 1759 by Ansel Bong, RN Outcome: Progressing   Problem: Activity: Goal: Risk for activity intolerance will decrease 03/18/2020 1759 by Ansel Bong, RN Outcome: Progressing 03/18/2020 1759 by Ansel Bong, RN Outcome: Progressing   Problem: Nutrition: Goal: Adequate nutrition will be maintained 03/18/2020 1759 by Ansel Bong, RN Outcome: Progressing 03/18/2020 1759 by Ansel Bong, RN Outcome: Progressing   Problem: Coping: Goal: Level of anxiety will decrease 03/18/2020 1759 by Ansel Bong, RN Outcome:  Progressing 03/18/2020 1759 by Ansel Bong, RN Outcome: Progressing   Problem: Elimination: Goal: Will not experience complications related to bowel motility 03/18/2020 1759 by Ansel Bong, RN Outcome: Progressing 03/18/2020 1759 by Ansel Bong, RN Outcome: Progressing Goal: Will not experience complications related to urinary retention 03/18/2020 1759 by Ansel Bong, RN Outcome: Progressing 03/18/2020 1759 by Ansel Bong, RN Outcome: Progressing   Problem: Pain Managment: Goal: General experience of comfort will improve 03/18/2020 1759 by Ansel Bong, RN Outcome: Progressing 03/18/2020 1759 by Ansel Bong, RN Outcome: Progressing   Problem: Safety: Goal: Ability to remain free from injury will improve 03/18/2020 1759 by Ansel Bong, RN Outcome: Progressing 03/18/2020 1759 by Ansel Bong, RN Outcome: Progressing   Problem: Skin Integrity: Goal: Risk for impaired skin integrity will decrease 03/18/2020 1759 by Ansel Bong, RN Outcome: Progressing 03/18/2020 1759 by Ansel Bong, RN Outcome: Progressing   Problem: Activity: Goal: Ability to tolerate increased activity will improve 03/18/2020 1759 by Ansel Bong, RN Outcome: Progressing 03/18/2020 1759 by Ansel Bong, RN Outcome: Progressing   Problem: Clinical Measurements: Goal: Ability to maintain a body temperature in the normal range will improve 03/18/2020 1759 by Ansel Bong, RN Outcome: Progressing 03/18/2020 1759 by Ansel Bong, RN Outcome: Progressing   Problem: Respiratory: Goal: Ability to maintain adequate ventilation will improve 03/18/2020 1759 by Ansel Bong, RN Outcome: Progressing 03/18/2020 1759 by Ansel Bong, RN Outcome: Progressing Goal: Ability to maintain a clear airway will improve 03/18/2020 1759 by Ansel Bong, RN Outcome: Progressing 03/18/2020 1759 by Ansel Bong, RN Outcome: Progressing

## 2020-03-18 NOTE — Progress Notes (Signed)
Mobility Specialist - Progress Note   03/18/20 1525  Mobility  Activity Ambulated in room;Ambulated in hall  Level of Assistance Modified independent, requires aide device or extra time (CGA for safety)  Assistive Device Front wheel walker  Distance Ambulated (ft) 380 ft  Mobility Response Tolerated well  Mobility performed by Mobility specialist  $Mobility charge 1 Mobility    Pt long-sitting in bed upon arrival. Pt agreed to session. Pt able to get to EOB independently. Pt S2S to RW mod. Independently. Pt ambulated 380' total in room and hallway mod. Independently using RW for safety. CGA utilized for safety. Pt intially on 6L O2 [place]. O2 sat > 94% ambulating 160' on 6L O2 [place]. Attempted to ambulate another 160' on 4L O2 [place], O2 sat > 92%. Attempted to ambulate an additional 30' on 3L O2 [place], O2 sat > 92%. Once pt returned to room, pt dangled on EOB for a couple of minutes. Attempted to decrease O2 to 2.5L O2 [place]. O2 sat > 90% transitioning from sit to supine on 2.5L O2 [place]. Resting on 2.5L O2 [place], O2 sat 96%. Overall, pt tolerated session very well. Pt pleasant and motivated t/o session. Pt left laying in bed w/ alarm set. All needs placed in reach. Nurse was notified.    Suvi Archuletta Mobility Specialist  03/18/20, 3:33 PM

## 2020-03-19 DIAGNOSIS — G9341 Metabolic encephalopathy: Secondary | ICD-10-CM

## 2020-03-19 LAB — BASIC METABOLIC PANEL
Anion gap: 11 (ref 5–15)
BUN: 53 mg/dL — ABNORMAL HIGH (ref 8–23)
CO2: 21 mmol/L — ABNORMAL LOW (ref 22–32)
Calcium: 10.1 mg/dL (ref 8.9–10.3)
Chloride: 105 mmol/L (ref 98–111)
Creatinine, Ser: 1.41 mg/dL — ABNORMAL HIGH (ref 0.44–1.00)
GFR, Estimated: 38 mL/min — ABNORMAL LOW (ref >60)
Glucose, Bld: 191 mg/dL — ABNORMAL HIGH (ref 70–99)
Potassium: 3.8 mmol/L (ref 3.5–5.1)
Sodium: 137 mmol/L (ref 135–145)

## 2020-03-19 LAB — C-REACTIVE PROTEIN: CRP: 0.9 mg/dL (ref <1.0)

## 2020-03-19 MED ORDER — TRAZODONE HCL 100 MG PO TABS: 50.0000 mg | ORAL_TABLET | Freq: Every evening | ORAL | Status: AC | PRN

## 2020-03-19 NOTE — Discharge Summary (Addendum)
Physician Discharge Summary  [name] UJW:[id number] DOB: 1941-04-05 DOA: 03/12/2020  PCP: Danella Penton, MD  Admit date: 03/12/2020 Discharge date: 03/19/2020  Discharge disposition: Home with home health care   Recommendations for Outpatient Follow-Up:   1. Follow-up with PCP in 1 week   Discharge Diagnosis:   Principal Problem:   Severe sepsis (HCC) Active Problems:   Atrial fibrillation (HCC)   Acute on chronic diastolic CHF (congestive heart failure) (HCC)   Hypertension   GERD (gastroesophageal reflux disease)   AKI (acute kidney injury) (HCC)   Acute respiratory failure with hypoxia (HCC)   Elevated troponin   Hyperkalemia   Macrocytosis   Acute metabolic encephalopathy    Discharge Condition: Stable.  Diet recommendation:  Diet Order            Diet - low sodium heart healthy           Diet Heart Room service appropriate? Yes; Fluid consistency: Thin  Diet effective now                   Code Status: Full Code     Hospital Course:   [name] is a 79 y.o. female with a past medical history ofchronic A. fib (on flecainide and Eliquis), anxiety, CKD stage IIIb, chronic diastolic CHF, GERD, HTN,OSA, obesity, recent dental procedure 2 days prior to admission.  She presented to the hospital because of fever and decreased oxygen saturation.  Oxygen saturation was 75% on room air.  She was admitted to the hospital for severe sepsis secondary to community-acquired pneumonia complicated by acute kidney injury, acute hypoxic respiratory failure and acute metabolic encephalopathy.  She was treated with IV antibiotics.  She was also treated with IV Lasix for acute on chronic diastolic CHF.2D echo showed EF estimated at 65 to 70% with grade 2 diastolic dysfunction and moderate LVH.  She was initially requiring oxygenation via nonrebreather mask.  She was switched to oxygenation via heated high flow nasal cannula.  She was slowly weaned off of oxygen  successfully.  All her symptoms have resolved and she is deemed stable for discharge to home today.      Discharge Exam:    Vitals:   03/18/20 1611 03/18/20 1926 03/19/20 0519 03/19/20 1200  BP: 139/70 (!) 141/70 122/82 (!) 142/75  Pulse: (!) 56 61 85   Resp: (!) 21  Temp: 98.1 F (36.7 C) (!) 97.5 F (36.4 C) 98.2 F (36.8 C) (!) 97.5 F (36.4 C)  TempSrc: Oral Oral Oral Oral  SpO2: 92% 94% 95% 92%  Weight:   85.1 kg   Height:         GEN: NAD SKIN: Bruises/ecchymoses on dorsal aspect of right hand. EYES: EOMI ENT: MMM CV: RRR PULM: CTA B ABD: soft, ND, NT, +BS CNS: AAO x 3, non focal EXT: No edema or tenderness   The results of significant diagnostics from this hospitalization (including imaging, microbiology, ancillary and laboratory) are listed below for reference.     Procedures and Diagnostic Studies:   CT CHEST WO CONTRAST  Result Date: 03/14/2020 CLINICAL DATA:  Shortness of breath, chest pain. EXAM: CT CHEST WITHOUT CONTRAST TECHNIQUE: Multidetector CT imaging of the chest was performed following the standard protocol without IV contrast. COMPARISON:  November 18, 2009. FINDINGS: Cardiovascular: Atherosclerosis of thoracic aorta is noted without aneurysm formation. Mild cardiomegaly is noted. No pericardial effusion is noted. Mediastinum/Nodes: Large sliding-type hiatal hernia is noted. No  adenopathy is noted. Thyroid gland is unremarkable. Lungs/Pleura: No pneumothorax or pleural effusion is noted. Interval development of patchy airspace opacities throughout both lungs concerning for multifocal pneumonia. Bronchiectasis is noted in both lower lobes. Mild bilateral posterior basilar subsegmental atelectasis is noted. Upper Abdomen: No acute abnormality. Musculoskeletal: No chest wall mass or suspicious bone lesions identified. IMPRESSION: 1. Interval development of patchy airspace opacities throughout both lungs concerning for multifocal pneumonia. 2.  Bronchiectasis is noted in both lower lobes. 3. Large sliding-type hiatal hernia. 4. Aortic atherosclerosis. Aortic Atherosclerosis (ICD10-I70.0). Electronically Signed   By: Lupita RaiderJames  Green Jr M.D.   On: 03/14/2020 15:25   ECHOCARDIOGRAM COMPLETE  Result Date: 03/13/2020    ECHOCARDIOGRAM REPORT   Patient Name:   [name] Date of Exam: 03/13/2020 Medical Rec #:  [id number]   Height:       66.0 in Accession #:    [id number]  Weight:       195.1 lb Date of Birth:  06-03-1940   BSA:          1.979 m Patient Age:    79 years    BP:           130/67 mmHg Patient Gender: F           HR:           81 bpm. Exam Location:  [facility] Procedure: 2D Echo, Cardiac Doppler and Color Doppler Indications:     Elevated Troponin  History:         Patient has prior history of Echocardiogram examinations, most                  recent 06/14/2016. Arrythmias:Atrial Fibrillation; Risk                  Factors:Hypertension. Palpitations.  Sonographer:     Cristela BlueJerry Hege RDCS (AE) Referring Phys:  [id number] [facility] Diagnosing Phys: Cristal Deerhristopher End MD IMPRESSIONS  1. Left ventricular ejection fraction, by estimation, is 65 to 70%. The left ventricle has normal function. The left ventricle has no regional wall motion abnormalities. There is moderate asymmetric left ventricular hypertrophy of the septal segment. Left ventricular diastolic parameters are consistent with Grade II diastolic dysfunction (pseudonormalization). Elevated left atrial pressure.  2. Right ventricular systolic function is normal. The right ventricular size is normal. Mildly increased right ventricular wall thickness. Tricuspid regurgitation signal is inadequate for assessing PA pressure.  3. Left atrial size was moderately dilated.  4. Right atrial size was mildly dilated.  5. The mitral valve is degenerative. Trivial mitral valve regurgitation. No evidence of mitral stenosis.  6. The aortic valve is tricuspid. There is mild calcification of the aortic valve. There is  mild thickening of the aortic valve. Aortic valve regurgitation is mild to moderate. Mild to moderate aortic valve sclerosis/calcification is present, without any evidence of aortic stenosis.  7. The inferior vena cava is normal in size with <50% respiratory variability, suggesting right atrial pressure of 8 mmHg. FINDINGS  Left Ventricle: Left ventricular ejection fraction, by estimation, is 65 to 70%. The left ventricle has normal function. The left ventricle has no regional wall motion abnormalities. The left ventricular internal cavity size was normal in size. There is  moderate asymmetric left ventricular hypertrophy of the septal segment. Left ventricular diastolic parameters are consistent with Grade II diastolic dysfunction (pseudonormalization). Elevated left atrial pressure. Right Ventricle: The right ventricular size is normal. Mildly increased right ventricular wall thickness. Right ventricular systolic  function is normal. Tricuspid regurgitation signal is inadequate for assessing PA pressure. Left Atrium: Left atrial size was moderately dilated. Right Atrium: Right atrial size was mildly dilated. Pericardium: The pericardium was not well visualized. Mitral Valve: The mitral valve is degenerative in appearance. There is moderate thickening of the mitral valve leaflet(s). Trivial mitral valve regurgitation. No evidence of mitral valve stenosis. Tricuspid Valve: The tricuspid valve is not well visualized. Tricuspid valve regurgitation is trivial. Aortic Valve: The aortic valve is tricuspid. There is mild calcification of the aortic valve. There is mild thickening of the aortic valve. Aortic valve regurgitation is mild to moderate. Mild to moderate aortic valve sclerosis/calcification is present, without any evidence of aortic stenosis. Aortic valve mean gradient measures 4.0 mmHg. Aortic valve peak gradient measures 7.9 mmHg. Aortic valve area, by VTI measures 3.34 cm. Pulmonic Valve: The pulmonic valve  was grossly normal. Pulmonic valve regurgitation is trivial. Aorta: The aortic root is normal in size and structure. Pulmonary Artery: The pulmonary artery is not well seen. Venous: The inferior vena cava is normal in size with less than 50% respiratory variability, suggesting right atrial pressure of 8 mmHg. IAS/Shunts: The interatrial septum was not well visualized.  LEFT VENTRICLE PLAX 2D LVIDd:         4.29 cm  Diastology LVIDs:         2.80 cm  LV e' medial:    5.77 cm/s LV PW:         1.10 cm  LV E/e' medial:  17.0 LV IVS:        1.54 cm  LV e' lateral:   10.00 cm/s LVOT diam:     2.00 cm  LV E/e' lateral: 9.8 LV SV:         80 LV SV Index:   41 LVOT Area:     3.14 cm  RIGHT VENTRICLE RV Basal diam:  3.01 cm RV S prime:     10.90 cm/s TAPSE (M-mode): 2.1 cm LEFT ATRIUM              Index       RIGHT ATRIUM           Index LA diam:        3.90 cm  1.97 cm/m  RA Area:     21.00 cm LA Vol (A2C):   84.2 ml  42.55 ml/m RA Volume:   59.20 ml  29.92 ml/m LA Vol (A4C):   103.0 ml 52.05 ml/m LA Biplane Vol: 96.9 ml  48.97 ml/m  AORTIC VALVE                   PULMONIC VALVE AV Area (Vmax):    2.68 cm    PV Vmax:        0.61 m/s AV Area (Vmean):   2.95 cm    PV Peak grad:   1.5 mmHg AV Area (VTI):     3.34 cm    RVOT Peak grad: 2 mmHg AV Vmax:           140.50 cm/s AV Vmean:          91.400 cm/s AV VTI:            0.241 m AV Peak Grad:      7.9 mmHg AV Mean Grad:      4.0 mmHg LVOT Vmax:         120.00 cm/s LVOT Vmean:        85.800 cm/s LVOT  VTI:          0.256 m LVOT/AV VTI ratio: 1.06  AORTA Ao Root diam: 2.70 cm MITRAL VALVE MV Area (PHT): 3.89 cm    SHUNTS MV Decel Time: 195 msec    Systemic VTI:  0.26 m MV E velocity: 98.00 cm/s  Systemic Diam: 2.00 cm MV A velocity: 52.40 cm/s MV E/A ratio:  1.87 Christopher End MD Electronically signed by Yvonne Kendall MD Signature Date/Time: 03/13/2020/2:39:30 PM    Final      Labs:   Basic Metabolic Panel: Recent Labs  Lab 03/13/20 0751 03/13/20 0751  03/14/20 0407 03/14/20 0407 03/15/20 1610 03/15/20 9604 03/16/20 5409 03/16/20 8119 03/17/20 1478 03/17/20 0539 03/18/20 0656 03/19/20 0356  NA 136   < > 135   < > 135  --  137  --  135  --  138 137  K 4.6   < > 4.8   < > 4.6   < > 4.3   < > 4.5   < > 4.5 3.8  CL 102   < > 101   < > 99  --  103  --  102  --  105 105  CO2 24   < > 25   < > 24  --  25  --  23  --  23 21*  GLUCOSE 106*   < > 97   < > 176*  --  164*  --  157*  --  146* 191*  BUN 38*   < > 39*   < > 50*  --  60*  --  61*  --  51* 53*  CREATININE 1.64*   < > 1.53*   < > 1.67*  --  1.38*  --  1.41*  --  1.15* 1.41*  CALCIUM 9.0   < > 9.5   < > 9.8  --  9.9  --  10.1  --  10.2 10.1  MG 2.1  --  2.2  --   --   --  2.7*  --   --   --   --   --    < > = values in this interval not displayed.   GFR Estimated Creatinine Clearance: 36.7 mL/min (A) (by C-G formula based on SCr of 1.41 mg/dL (H)). Liver Function Tests: No results for input(s): AST, ALT, ALKPHOS, BILITOT, PROT, ALBUMIN in the last 168 hours. No results for input(s): LIPASE, AMYLASE in the last 168 hours. No results for input(s): AMMONIA in the last 168 hours. Coagulation profile No results for input(s): INR, PROTIME in the last 168 hours.  CBC: Recent Labs  Lab 03/14/20 0407 03/15/20 0523 03/16/20 0618 03/17/20 0539 03/18/20 0656  WBC 7.1 6.4 9.7 10.4 9.0  HGB 11.0* 12.5 13.0 12.7 13.2  HCT 34.1* 37.2 37.8 38.7 39.6  MCV 100.0 97.9 97.7 99.2 98.8  PLT 228 276 292 299 300   Cardiac Enzymes: No results for input(s): CKTOTAL, CKMB, CKMBINDEX, TROPONINI in the last 168 hours. BNP: Invalid input(s): POCBNP CBG: No results for input(s): GLUCAP in the last 168 hours. D-Dimer No results for input(s): DDIMER in the last 72 hours. Hgb A1c No results for input(s): HGBA1C in the last 72 hours. Lipid Profile No results for input(s): CHOL, HDL, LDLCALC, TRIG, CHOLHDL, LDLDIRECT in the last 72 hours. Thyroid function studies No results for input(s): TSH,  T4TOTAL, T3FREE, THYROIDAB in the last 72 hours.  Invalid input(s): FREET3 Anemia work up No results for input(s):  VITAMINB12, FOLATE, FERRITIN, TIBC, IRON, RETICCTPCT in the last 72 hours. Microbiology Recent Results (from the past 240 hour(s))  Blood culture (routine single)     Status: None   Collection Time: 03/12/20  1:51 PM   Specimen: BLOOD  Result Value Ref Range Status   Specimen Description BLOOD BLOOD LEFT FOREARM  Final   Special Requests   Final    BOTTLES DRAWN AEROBIC AND ANAEROBIC Blood Culture adequate volume   Culture   Final    NO GROWTH 5 DAYS Performed at [facility], [address] [place], [place] [address]    Report Status 03/17/2020 FINAL  Final  Respiratory Panel by RT PCR (Flu A&B, Covid) - Nasopharyngeal Swab     Status: None   Collection Time: 03/12/20  1:51 PM   Specimen: Nasopharyngeal Swab  Result Value Ref Range Status   SARS Coronavirus 2 by RT PCR NEGATIVE NEGATIVE Final    Comment: (NOTE) SARS-CoV-2 target nucleic acids are NOT DETECTED.  The SARS-CoV-2 RNA is generally detectable in upper respiratoy specimens during the acute phase of infection. The lowest concentration of SARS-CoV-2 viral copies this assay can detect is 131 copies/mL. A negative result does not preclude SARS-Cov-2 infection and should not be used as the sole basis for treatment or other patient management decisions. A negative result may occur with  improper specimen collection/handling, submission of specimen other than nasopharyngeal swab, presence of viral mutation(s) within the areas targeted by this assay, and inadequate number of viral copies (<131 copies/mL). A negative result must be combined with clinical observations, patient history, and epidemiological information. The expected result is Negative.  Fact Sheet for Patients:  https://www.moore.com/  Fact Sheet for Healthcare Providers:   https://www.young.biz/  This test is no t yet approved or cleared by the [place] FDA and  has been authorized for detection and/or diagnosis of SARS-CoV-2 by FDA under an Emergency Use Authorization (EUA). This EUA will remain  in effect (meaning this test can be used) for the duration of the COVID-19 declaration under Section 564(b)(1) of the Act, 21 U.S.C. section 360bbb-3(b)(1), unless the authorization is terminated or revoked sooner.     Influenza A by PCR NEGATIVE NEGATIVE Final   Influenza B by PCR NEGATIVE NEGATIVE Final    Comment: (NOTE) The Xpert Xpress SARS-CoV-2/FLU/RSV assay is intended as an aid in  the diagnosis of influenza from Nasopharyngeal swab specimens and  should not be used as a sole basis for treatment. Nasal washings and  aspirates are unacceptable for Xpert Xpress SARS-CoV-2/FLU/RSV  testing.  Fact Sheet for Patients: https://www.moore.com/  Fact Sheet for Healthcare Providers: https://www.young.biz/  This test is not yet approved or cleared by the [place] FDA and  has been authorized for detection and/or diagnosis of SARS-CoV-2 by  FDA under an Emergency Use Authorization (EUA). This EUA will remain  in effect (meaning this test can be used) for the duration of the  Covid-19 declaration under Section 564(b)(1) of the Act, 21  U.S.C. section 360bbb-3(b)(1), unless the authorization is  terminated or revoked. Performed at [facility], [address] [place], [place] [address]   Respiratory Panel by RT PCR (Flu A&B, Covid) - Nasopharyngeal Swab     Status: None   Collection Time: 03/12/20  4:59 PM   Specimen: Nasopharyngeal Swab  Result Value Ref Range Status   SARS Coronavirus 2 by RT PCR NEGATIVE NEGATIVE Final    Comment: (NOTE) SARS-CoV-2 target nucleic acids are NOT DETECTED.  The SARS-CoV-2 RNA is generally detectable in upper respiratoy specimens during the  acute phase of infection. The lowest concentration of SARS-CoV-2 viral copies this assay can detect is 131 copies/mL. A negative result does not preclude SARS-Cov-2 infection and should not be used as the sole basis for treatment or other patient management decisions. A negative result may occur with  improper specimen collection/handling, submission of specimen other than nasopharyngeal swab, presence of viral mutation(s) within the areas targeted by this assay, and inadequate number of viral copies (<131 copies/mL). A negative result must be combined with clinical observations, patient history, and epidemiological information. The expected result is Negative.  Fact Sheet for Patients:  https://www.moore.com/  Fact Sheet for Healthcare Providers:  https://www.young.biz/  This test is no t yet approved or cleared by the [place] FDA and  has been authorized for detection and/or diagnosis of SARS-CoV-2 by FDA under an Emergency Use Authorization (EUA). This EUA will remain  in effect (meaning this test can be used) for the duration of the COVID-19 declaration under Section 564(b)(1) of the Act, 21 U.S.C. section 360bbb-3(b)(1), unless the authorization is terminated or revoked sooner.     Influenza A by PCR NEGATIVE NEGATIVE Final   Influenza B by PCR NEGATIVE NEGATIVE Final    Comment: (NOTE) The Xpert Xpress SARS-CoV-2/FLU/RSV assay is intended as an aid in  the diagnosis of influenza from Nasopharyngeal swab specimens and  should not be used as a sole basis for treatment. Nasal washings and  aspirates are unacceptable for Xpert Xpress SARS-CoV-2/FLU/RSV  testing.  Fact Sheet for Patients: https://www.moore.com/  Fact Sheet for Healthcare Providers: https://www.young.biz/  This test is not yet approved or cleared by the [place] FDA and  has been authorized for detection and/or diagnosis  of SARS-CoV-2 by  FDA under an Emergency Use Authorization (EUA). This EUA will remain  in effect (meaning this test can be used) for the duration of the  Covid-19 declaration under Section 564(b)(1) of the Act, 21  U.S.C. section 360bbb-3(b)(1), unless the authorization is  terminated or revoked. Performed at [facility], [address] [place], [place] [address]   Urine culture     Status: Abnormal   Collection Time: 03/12/20  9:52 PM   Specimen: In/Out Cath Urine  Result Value Ref Range Status   Specimen Description   Final    IN/OUT CATH URINE Performed at [facility], [address] [place], [place] [address]    Special Requests   Final    NONE Performed at [facility], [address] [place], [place] [address]    Culture MULTIPLE SPECIES PRESENT, SUGGEST RECOLLECTION (A)  Final   Report Status 03/14/2020 FINAL  Final  MRSA PCR Screening     Status: None   Collection Time: 03/12/20  9:52 PM   Specimen: Nasopharyngeal  Result Value Ref Range Status   MRSA by PCR NEGATIVE NEGATIVE Final    Comment:        The GeneXpert MRSA Assay (FDA approved for NASAL specimens only), is one component of a comprehensive MRSA colonization surveillance program. It is not intended to diagnose MRSA infection nor to guide or monitor treatment for MRSA infections. Performed at [facility], [address], [place], [place] [address]      Discharge Instructions:   Discharge Instructions    DME Bedside commode   Complete by: As directed    Patient needs a bedside commode to treat with the following condition: Debility  Diet - low sodium heart healthy   Complete by: As directed    Face-to-face encounter (required for Medicare/Medicaid patients)   Complete by: As directed    I Savonna Birchmeier certify that this patient is under my care and that I, or a nurse practitioner or physician's assistant working with me, had a face-to-face encounter  that meets the physician face-to-face encounter requirements with this patient on 03/19/2020. The encounter with the patient was in whole, or in part for the following medical condition(s) which is the primary reason for home health care (List medical condition): CHF, debility   The encounter with the patient was in whole, or in part, for the following medical condition, which is the primary reason for home health care: CHF, debility   I certify that, based on my findings, the following services are medically necessary home health services:  Nursing Physical therapy     Reason for Medically Necessary Home Health Services:  Skilled Nursing- Skilled Assessment/Observation Skilled Nursing- Change/Decline in Patient Status Therapy- Investment banker, operational, Teacher, early years/pre and Stair Training     My clinical findings support the need for the above services: Unable to leave home safely without assistance and/or assistive device   Further, I certify that my clinical findings support that this patient is homebound due to: Unable to leave home safely without assistance   Home Health   Complete by: As directed    To provide the following care/treatments:  PT OT RN     Increase activity slowly   Complete by: As directed      Allergies as of 03/19/2020      Reactions   Penicillins Hives   Did it involve swelling of the face/tongue/throat, SOB, or low BP? NO Did it involve sudden or severe rash/hives, skin peeling, or any reaction on the inside of your mouth or nose? Yes. Pt reports severe hives (blistering) from neck to her stomach Did you need to seek medical attention at a hospital or doctor's office? Unknown When did it last happen?>36 years ago If all above answers are "NO", may proceed with cephalosporin use.   Amlodipine Rash   Clarithromycin Rash   Minocycline Other (See Comments)   Onset 04/10/2001.  / unknown reaction   Propoxyphene Hives   Onset 04/10/2001.   Ace Inhibitors Cough   Norvasc  [amlodipine Besylate] Hives   Vicodin [hydrocodone-acetaminophen] Hives   Hives occur after 5 doses   Betadine [povidone Iodine] Rash, Other (See Comments)   blisters   Iodine Rash   Sulfa Antibiotics Diarrhea, Nausea Only      Medication List    STOP taking these medications   baclofen 10 MG tablet Commonly known as: LIORESAL   diclofenac sodium 1 % Gel Commonly known as: VOLTAREN   guaiFENesin-dextromethorphan 100-10 MG/5ML syrup Commonly known as: ROBITUSSIN DM   hydrALAZINE 25 MG tablet Commonly known as: APRESOLINE   omeprazole 20 MG tablet Commonly known as: PRILOSEC OTC   pyridoxine 200 MG tablet Commonly known as: B-6   triamcinolone lotion 0.1 % Commonly known as: KENALOG     TAKE these medications   ALPRAZolam 0.25 MG tablet Commonly known as: XANAX Take 0.25 mg by mouth 3 (three) times daily as needed for anxiety.   azelastine 0.1 % nasal spray Commonly known as: ASTELIN Place 1 spray into the nose 2 (two) times daily.   beta carotene w/minerals tablet Take 1 tablet by mouth 2 (two) times daily.   Calcium Carb-Cholecalciferol 600-400 MG-UNIT Tabs Take 1  tablet by mouth 2 (two) times daily.   carbamazepine 100 MG 12 hr tablet Commonly known as: TEGRETOL XR Take 100 mg by mouth 2 (two) times daily.   celecoxib 100 MG capsule Commonly known as: CELEBREX TAKE 1 CAPSULE(100 MG) BY MOUTH TWICE DAILY   Cetirizine HCl 10 MG Caps Take 10 mg by mouth daily.   colchicine 0.6 MG tablet Take 1 tablet (0.6 mg total) by mouth daily as needed (gout flare).   Eliquis 5 MG Tabs tablet Generic drug: apixaban TAKE 1 TABLET(5 MG) BY MOUTH TWICE DAILY What changed: See the new instructions.   esomeprazole 20 MG capsule Commonly known as: NEXIUM Take 20 mg by mouth daily before breakfast.   estradiol 1 MG tablet Commonly known as: ESTRACE Take 1.5 mg by mouth daily.   flecainide 100 MG tablet Commonly known as: TAMBOCOR TAKE 1 TABLET BY MOUTH TWICE  DAILY What changed:   how much to take  how to take this  when to take this  additional instructions   FLUoxetine 20 MG capsule Commonly known as: PROZAC Take 20 mg by mouth daily.   fluticasone 50 MCG/ACT nasal spray Commonly known as: FLONASE Place 2 sprays into both nostrils every evening.   furosemide 40 MG tablet Commonly known as: LASIX Take 0.5 tablets (20 mg total) by mouth daily.   Magnesium 500 MG Caps Take 500 mg by mouth 2 (two) times daily.   metolazone 2.5 MG tablet Commonly known as: ZAROXOLYN Take 2.5 mg by mouth daily.   metoprolol tartrate 25 MG tablet Commonly known as: LOPRESSOR Take 0.5 tablets (12.5 mg total) by mouth in the morning and at bedtime.   montelukast 10 MG tablet Commonly known as: SINGULAIR TAKE 1 TABLET(10 MG) BY MOUTH AT BEDTIME What changed: See the new instructions.   multivitamin with minerals Tabs tablet Take 1 tablet by mouth daily with supper.   ondansetron 4 MG tablet Commonly known as: ZOFRAN Take 4 mg by mouth every 6 (six) hours as needed for nausea or vomiting.   oxybutynin 5 MG tablet Commonly known as: DITROPAN Take 5 mg by mouth every evening.   pramipexole 0.5 MG tablet Commonly known as: MIRAPEX Take 0.25 mg by mouth 4 (four) times daily.   spironolactone 25 MG tablet Commonly known as: ALDACTONE Take 25 mg by mouth daily.   sucralfate 1 g tablet Commonly known as: CARAFATE Take 1 g by mouth 3 (three) times daily.   SUMAtriptan 50 MG tablet Commonly known as: IMITREX Take 50 mg by mouth daily as needed for migraine.   tiZANidine 4 MG tablet Commonly known as: ZANAFLEX Take 2 mg by mouth 3 (three) times daily as needed for muscle spasms.   traMADol 50 MG tablet Commonly known as: ULTRAM Take 50 mg by mouth 3 (three) times daily as needed for moderate pain.   traZODone 100 MG tablet Commonly known as: DESYREL Take 0.5 tablets (50 mg total) by mouth at bedtime as needed for sleep. What  changed:   medication strength  how much to take  when to take this  reasons to take this   Ventolin HFA 108 (90 Base) MCG/ACT inhaler Generic drug: albuterol Inhale 1-2 puffs into the lungs every 6 (six) hours as needed for wheezing or shortness of breath.   verapamil 180 MG 24 hr capsule Commonly known as: VERELAN PM Take 180 mg by mouth in the morning and at bedtime.   vitamin B-12 1000 MCG tablet Commonly known as: CYANOCOBALAMIN  Take 1,000 mcg by mouth at bedtime.   vitamin C 1000 MG tablet Take 1,000 mg by mouth daily.            Durable Medical Equipment  (From admission, onward)         Start     Ordered   03/19/20 0000  DME Bedside commode       Question:  Patient needs a bedside commode to treat with the following condition  Answer:  Debility   03/19/20 1405          Follow-up Information    [facility] Follow up on 03/25/2020.   Specialty: Cardiology Why: at 10:00am. Enter through the Medical Mall entrance Contact information: [address] [place] [address] [phone]               Time coordinating discharge: 32 minutes  Signed:  Lurene Shadow  [facility] 03/19/2020, 4:55 PM   Pager on [email or web address]. If 7PM-7AM, please contact night-coverage at [email or web address]

## 2020-03-19 NOTE — Care Management Important Message (Signed)
Important Message  Patient Details  Name: [name] MRN: [id number] Date of Birth: 27-Jun-1940   Medicare Important Message Given:  Yes     [name] 03/19/2020, 1:37 PM

## 2020-03-19 NOTE — Progress Notes (Signed)
Occupational Therapy Treatment Patient Details Name: [name] MRN: [id number] DOB: Jan 24, 1941 Today's Date: 03/19/2020    History of present illness [name] is a 79 y.o. female with Past medical history of chronic A. fib, anxiety, chronic diastolic CHF, GERD, HTN, OSA, obesity.  Presents with severe hypoxia requiring heated high flow nasal cannula for now.  Likely from community-acquired pneumonia as well as CHF.   OT comments  Upon entering the room, pt supine in bed with no c/o pain this session. Pt verbalized, " I smell sour". Pt ambulating with RW and min guard into bathroom for BM. Pt able to perform clothing management and hygiene with min guard for balance. Pt standing at sink for grooming tasks and UB bathing with close supervision for ~ 30 minutes. Pt needing min guard - min A for balance with LB hygiene. Pt returning to seated position for safety awareness and also fatigue. Pt on 2.5 L O2 via [place] with O2 saturation at 92% and HR 89 bpm. Pt taking seated rest break and set up A to don hospital gown.   Follow Up Recommendations  Home health OT;Supervision/Assistance - 24 hour    Equipment Recommendations  None recommended by OT       Precautions / Restrictions Precautions Precautions: Fall       Mobility Bed Mobility Overal bed mobility: Modified Independent      General bed mobility comments: HOB elevated and use of bed rails  Transfers Overall transfer level: Needs assistance Equipment used: Rolling walker (2 wheeled) Transfers: Sit to/from Stand Sit to Stand: Min guard         General transfer comment: with min cuing for hand placement    Balance Overall balance assessment: Needs assistance Sitting-balance support: Feet supported;No upper extremity supported Sitting balance-Leahy Scale: Normal     Standing balance support: Bilateral upper extremity supported Standing balance-Leahy Scale: Good Standing balance comment: B UE support        ADL  either performed or assessed with clinical judgement   ADL Overall ADL's : Needs assistance/impaired Eating/Feeding: Supervision/ safety   Grooming: Wash/dry hands;Wash/dry face;Oral care;Standing   Upper Body Bathing: Supervision/ safety;Standing;Set up   Lower Body Bathing: Min guard;Sit to/from stand   Upper Body Dressing : Set up;Supervision/safety;Standing       Toilet Transfer: Minimal assistance;Regular Toilet;Ambulation;RW   Toileting- Architect and Hygiene: Min guard;Sit to/from stand         General ADL Comments: Pt engaged in standing self care tasks for ~ 30 minutes this session with supervision - min A for dynamic standing balance     Vision Baseline Vision/History: Wears glasses Wears Glasses: At all times Patient Visual Report: No change from baseline            Cognition Arousal/Alertness: Awake/alert Behavior During Therapy: WFL for tasks assessed/performed Overall Cognitive Status: Within Functional Limits for tasks assessed                          Pertinent Vitals/ Pain       Pain Assessment: No/denies pain         Frequency  Min 2X/week        Progress Toward Goals  OT Goals(current goals can now be found in the care plan section)  Progress towards OT goals: Progressing toward goals  Acute Rehab OT Goals Patient Stated Goal: To return home OT Goal Formulation: With patient Time For Goal Achievement: 03/29/20 Potential to  Achieve Goals: Good  Plan Discharge plan remains appropriate       AM-PAC OT "6 Clicks" Daily Activity     Outcome Measure   Help from another person eating meals?: None Help from another person taking care of personal grooming?: A Little Help from another person toileting, which includes using toliet, bedpan, or urinal?: A Little Help from another person bathing (including washing, rinsing, drying)?: A Little Help from another person to put on and taking off regular upper body clothing?:  None Help from another person to put on and taking off regular lower body clothing?: A Little 6 Click Score: 20    End of Session Equipment Utilized During Treatment: Oxygen (2.5 Ls)  OT Visit Diagnosis: Other abnormalities of gait and mobility (R26.89)   Activity Tolerance Patient tolerated treatment well   Patient Left in chair;with call bell/phone within reach   Nurse Communication          Time: 8101-7510 OT Time Calculation (min): 56 min  Charges: OT General Charges $OT Visit: 1 Visit OT Treatments $Self Care/Home Management : 53-67 mins  Jackquline Denmark, MS, OTR/L , CBIS ascom [phone]  03/19/20, 1:38 PM

## 2020-03-19 NOTE — Progress Notes (Signed)
Patient requires no oxygen she maintains a o2 sat of 94 while ambulation and while to bath room

## 2020-03-19 NOTE — TOC Progression Note (Signed)
Transition of Care [facility]) - Progression Note    Patient Details  Name: [name] MRN: [id number] Date of Birth: 1941/04/12  Transition of Care [facility]) CM/SW Contact  Maree Krabbe, LCSW Phone Number: 03/19/2020, 12:37 PM  Clinical Narrative:   HH arranged through [facility] when medically stable for d/c. Need HH order prior to d/c (pt had RN, PT, and OT).    Expected Discharge Plan: Home w Home Health Services Barriers to Discharge: Continued Medical Work up  Expected Discharge Plan and Services Expected Discharge Plan: Home w Home Health Services     Post Acute Care Choice: Home Health Living arrangements for the past 2 months: Single Family Home                           HH Arranged: RN, PT, OT [facility] Agency: [facility] Date HH Agency Contacted: 03/17/20 Time HH Agency Contacted: 1027 Representative spoke with at [facility] Agency: Barbara Cower   Social Determinants of Health (SDOH) Interventions    Readmission Risk Interventions Readmission Risk Prevention Plan 06/17/2019  Transportation Screening Complete  HRI or Home Care Consult Complete  Palliative Care Screening Not Complete  Medication Review (RN Care Manager) Complete  Some recent data might be hidden

## 2020-03-19 NOTE — TOC Transition Note (Signed)
Transition of Care [facility]) - CM/SW Discharge Note   Patient Details  Name: [name] MRN: [id number] Date of Birth: 11-Aug-1940  Transition of Care [facility]) CM/SW Contact:  Chapman Fitch, RN Phone Number: 03/19/2020, 2:46 PM   Clinical Narrative:    Patient to discharge today with resumption of home health services.  Barbara Cower with [facility] notified  Patient did not qualify for Home O2 Patient requesting BSC.  Jermaine with [employer] to deliver  Patient states her POA will provide transportation at discharge    Final next level of care: Home w Home Health Services Barriers to Discharge: No Barriers Identified   Patient Goals and CMS Choice     Choice offered to / list presented to : Patient  Discharge Placement                       Discharge Plan and Services     Post Acute Care Choice: Home Health          DME Arranged: 3-N-1         HH Arranged: RN, PT, OT [facility] Agency: [facility] Date HH Agency Contacted: 03/19/20 Time HH Agency Contacted: 1027 Representative spoke with at [facility] Agency: Barbara Cower  Social Determinants of Health (SDOH) Interventions     Readmission Risk Interventions Readmission Risk Prevention Plan 06/17/2019  Transportation Screening Complete  HRI or Home Care Consult Complete  Palliative Care Screening Not Complete  Medication Review (RN Care Manager) Complete  Some recent data might be hidden

## 2020-03-19 NOTE — Progress Notes (Signed)
Mobility Specialist - Progress Note   03/19/20 1503  Mobility  Activity Ambulated in room;Ambulated in hall  Level of Assistance Modified independent, requires aide device or extra time  Assistive Device Front wheel walker  Distance Ambulated (ft) 100 ft  Mobility Response Tolerated well  Mobility performed by Mobility specialist  $Mobility charge 1 Mobility    Pt sitting on recliner upon arrival. Pt agreed to session. Pt currently on RA. Pt ambulated 100' total in room and hallway mod. Independently. Pt utilized RW for safety. No LOB noted. No c/o SOB or pain. Pt desat to 87% after ambulating ~ 40'. Pt able to recover O2 by PLB. Overall, pt tolerated session well. Pt left sitting on recliner w/ all needs placed in reach. Nurse entered room at the end of session and was notified.    Zali Kamaka Mobility Specialist  03/19/20, 3:08 PM

## 2020-03-19 NOTE — Plan of Care (Signed)

## 2020-03-25 ENCOUNTER — Ambulatory Visit: Payer: Medicare Other | Admitting: Family

## 2020-03-31 ENCOUNTER — Telehealth: Payer: Self-pay | Admitting: Cardiovascular Disease

## 2020-03-31 NOTE — Telephone Encounter (Signed)
Pt c/o swelling: STAT is pt has developed SOB within 24 hours  1) How much weight have you gained and in what time span? 6 lbs in a week and 3 overnight   2) If swelling, where is the swelling located? Edema in ankles   3) Are you currently taking a fluid pill? Recent hosp dc taking   Metolazone 2.5 po q d spironolactone 25 mg po q d 80 mg   4) Are you currently SOB? No   5) Do you have a log of your daily weights (if so, list)? Yes   6) Have you gained 3 pounds in a day or 5 pounds in a week? yes  7) Have you traveled recently? No

## 2020-04-01 NOTE — Telephone Encounter (Signed)
Attempted to contact pt. No answer on home/cell. LMOM TCB.  Pt was last seen in our office by Dr. Mariah Milling 09/24/19.  Recently in hospital 03/12/20 for SOB.  It appears her PCP has been managing her care since hospital discharge - Had labs today and PCP (Dr. Hyacinth Meeker) stopped Spironolactone.   When pt calls back, we can schedule her to be seen in our office preferably within the next week.

## 2020-04-02 NOTE — Telephone Encounter (Signed)
Spoke with patient. Has appointment with Dr Hyacinth Meeker on 04/08/20.  Patient is currently following advise from PCP concerning elevated potassium and diuretics. Advised patient to continue to consult with PCP at this time to discourage too many providers changing medications at this time. Scheduled patient to come in on 04/13/20 to see Gillian Shields, NP for evaluation.  Patient verbalized understanding to call us if any new symptoms or concerns arise.

## 2020-04-13 ENCOUNTER — Encounter: Payer: Self-pay | Admitting: Family

## 2020-04-13 ENCOUNTER — Other Ambulatory Visit: Payer: Self-pay

## 2020-04-13 ENCOUNTER — Ambulatory Visit (INDEPENDENT_AMBULATORY_CARE_PROVIDER_SITE_OTHER): Payer: Medicare Other | Admitting: Family

## 2020-04-13 VITALS — BP 110/70 | HR 77 | Ht 66.0 in | Wt 192.0 lb

## 2020-04-13 DIAGNOSIS — I480 Paroxysmal atrial fibrillation: Secondary | ICD-10-CM

## 2020-04-13 DIAGNOSIS — I5032 Chronic diastolic (congestive) heart failure: Secondary | ICD-10-CM

## 2020-04-13 DIAGNOSIS — Z7901 Long term (current) use of anticoagulants: Secondary | ICD-10-CM

## 2020-04-13 DIAGNOSIS — R002 Palpitations: Secondary | ICD-10-CM

## 2020-04-13 DIAGNOSIS — N1832 Chronic kidney disease, stage 3b: Secondary | ICD-10-CM

## 2020-04-13 NOTE — Progress Notes (Signed)
Office Visit    Patient Name: [name] Date of Encounter: 04/13/2020  Primary Care Provider:  Danella Penton, MD Primary Cardiologist:  Julien Nordmann, MD Electrophysiologist:  None   Chief Complaint    [name] is a 79 y.o. female with a hx of paroxysmal atrial fibrillation on Eliquis and flecainide, anxiety, CKD 3B, OSA, obesity, chronic diastolic heart failure, HTN, GERD, hyperkalemia presents today for hospital follow-up  Past Medical History    Past Medical History:  Diagnosis Date  . A-fib (HCC) 2012, 2014  . Anxiety   . Arrhythmia    A-fib  . Bursitis    hips  . Chronic diastolic (congestive) heart failure (HCC)   . Concussion   . Concussion July, 28, 2014  . GERD (gastroesophageal reflux disease)   . Hypertension   . Palpitations   . Pneumonia 2012   Past Surgical History:  Procedure Laterality Date  . ABDOMINAL SURGERY  2008   abdominal muscle mesh insert  . Arm surgery  2015   Pin implanted   . CATARACT EXTRACTION Right August 30, 2013  . CATARACT EXTRACTION Left August 09, 2013  . COLONOSCOPY    . ESOPHAGOGASTRODUODENOSCOPY    . HERNIA REPAIR Left 1970  . HERNIA REPAIR  2015   Dr. Malissa Hippo  . KNEE SURGERY Right 1980  . NOSE SURGERY    . SHOULDER SURGERY Right 2000  . TOTAL ABDOMINAL HYSTERECTOMY  1995    Allergies  Allergies  Allergen Reactions  . Penicillins Hives    Did it involve swelling of the face/tongue/throat, SOB, or low BP? NO Did it involve sudden or severe rash/hives, skin peeling, or any reaction on the inside of your mouth or nose? Yes. Pt reports severe hives (blistering) from neck to her stomach Did you need to seek medical attention at a hospital or doctor's office? Unknown When did it last happen?>36 years ago If all above answers are "NO", may proceed with cephalosporin use.  . Amlodipine Rash  . Clarithromycin Rash  . Minocycline Other (See Comments)    Onset 04/10/2001.  / unknown reaction  . Propoxyphene Hives      Onset 04/10/2001.   . Ace Inhibitors Cough  . Norvasc [Amlodipine Besylate] Hives  . Vicodin [Hydrocodone-Acetaminophen] Hives    Hives occur after 5 doses   . Betadine [Povidone Iodine] Rash and Other (See Comments)    blisters  . Iodine Rash  . Sulfa Antibiotics Diarrhea and Nausea Only    History of Present Illness    [name] is a 79 y.o. female with a hx of  paroxysmal atrial fibrillation on Eliquis and flecainide, anxiety, CKD 3B, OSA, obesity, chronic diastolic heart failure, HTN, GERD, hyperkalemia  last seen 09/2019 by Dr. Mariah Milling.  She was admitted 03/12/2020 due to severe sepsis secondary to community-acquired pneumonia complicated by acute kidney injury, acute hypoxic respiratory failure, acute metabolic encephalopathy.  She was tested twice for Covid during hospitalization both were negative.  Treated with IV antibiotics as well as IV Lasix.  Echo 03/13/2020 with LVEF 65-70%, grade 2 diastolic dysfunction, moderate LVH.  She was discharged on furosemide 20 mg daily as well as spironolactone 25 mg daily.  Seen in follow-up by her primary care provider.  She was started on albuterol along with budesonide.  She had elevated potassium of 5.9 as such as spironolactone was discontinued and hydrochlorothiazide started.  Labs via Care Everywhere 04/08/2020  K4.0, NA 142, creatinine 1.4, GFR 36  She presents today for follow-up with her friend.  Her friend is a retired Research scientist (physical sciences) and is very involved in her medical care.  She reports no chest pain, pressure, tightness.  Ports no shortness of breath at rest endorses her dyspnea on exertion is stable.  Her previous productive cough has resolved.  Tells me her lower extremity edema is improved with HCTZ 25 mg daily and Lasix 40 mg daily.  She does notice when she uses the albuterol inhaler that she has palpitations and as such, she has stopped using it.  She notices some mild tremor in her hands with the Pulmicort nebulizer  solution and encouraged her to discuss with her primary care provider.  We discussed that some of her palpitations can be triggered by dehydration, caffeine intake.  She reports that her heart rate at home has been as high as 95 bpm and reassurance was provided that her resting heart rate can be anywhere 55 to 99 bpm.  EKGs/Labs/Other Studies Reviewed:   The following studies were reviewed today:  Echo 03/13/2020  1. Left ventricular ejection fraction, by estimation, is 65 to 70%. The  left ventricle has normal function. The left ventricle has no regional  wall motion abnormalities. There is moderate asymmetric left ventricular  hypertrophy of the septal segment.  Left ventricular diastolic parameters are consistent with Grade II  diastolic dysfunction (pseudonormalization). Elevated left atrial  pressure.   2. Right ventricular systolic function is normal. The right ventricular  size is normal. Mildly increased right ventricular wall thickness.  Tricuspid regurgitation signal is inadequate for assessing PA pressure.   3. Left atrial size was moderately dilated.   4. Right atrial size was mildly dilated.   5. The mitral valve is degenerative. Trivial mitral valve regurgitation.  No evidence of mitral stenosis.   6. The aortic valve is tricuspid. There is mild calcification of the  aortic valve. There is mild thickening of the aortic valve. Aortic valve  regurgitation is mild to moderate. Mild to moderate aortic valve  sclerosis/calcification is present, without  any evidence of aortic stenosis.   7. The inferior vena cava is normal in size with <50% respiratory  variability, suggesting right atrial pressure of 8 mmHg.   EKG:  EKG is ordered today.  The ekg ordered today demonstrates NSR 77 bpm with 1st degree AV block (PR 246) and LVH with repolarization abnormality.   Recent Labs: 03/12/2020: ALT 25; B Natriuretic Peptide 909.5 03/16/2020: Magnesium 2.7 03/18/2020: Hemoglobin 13.2;  Platelets 300 03/19/2020: BUN 53; Creatinine, Ser 1.41; Potassium 3.8; Sodium 137  Recent Lipid Panel    Component Value Date/Time   CHOL 220 (H) 04/25/2013 0526   TRIG 193 04/25/2013 0526   HDL 49 04/25/2013 0526   CHOLHDL 5.3 08/21/2010 0415   VLDL 39 04/25/2013 0526   LDLCALC 132 (H) 04/25/2013 0526     Home Medications   Current Meds  Medication Sig  . albuterol (VENTOLIN HFA) 108 (90 Base) MCG/ACT inhaler Inhale 1-2 puffs into the lungs every 6 (six) hours as needed for wheezing or shortness of breath.  . ALPRAZolam (XANAX) 0.25 MG tablet Take 0.25 mg by mouth 3 (three) times daily as needed for anxiety.   . Ascorbic Acid (VITAMIN C) 1000 MG tablet Take 1,000 mg by mouth daily.  Marland Kitchen azelastine (ASTELIN) 0.1 % nasal spray Place 1 spray into both nostrils daily. Use in each nostril as directed  . Calcium Carb-Cholecalciferol 600-400 MG-UNIT TABS Take 1 tablet by mouth 2 (  two) times daily.  . carbamazepine (TEGRETOL XR) 100 MG 12 hr tablet Take 100 mg by mouth 2 (two) times daily.  . celecoxib (CELEBREX) 100 MG capsule TAKE 1 CAPSULE(100 MG) BY MOUTH TWICE DAILY  . Cetirizine HCl 10 MG CAPS Take 10 mg by mouth daily.  . colchicine 0.6 MG tablet Take 1 tablet (0.6 mg total) by mouth daily as needed (gout flare).  Marland Kitchen ELIQUIS 5 MG TABS tablet TAKE 1 TABLET(5 MG) BY MOUTH TWICE DAILY  . esomeprazole (NEXIUM) 20 MG capsule Take 20 mg by mouth daily before breakfast.   . estradiol (ESTRACE) 1 MG tablet Take 1.5 mg by mouth daily.   . flecainide (TAMBOCOR) 100 MG tablet TAKE 1 TABLET BY MOUTH TWICE DAILY  . FLUoxetine (PROZAC) 20 MG capsule Take 20 mg by mouth daily.   . fluticasone (FLONASE) 50 MCG/ACT nasal spray Place 2 sprays into both nostrils 2 (two) times daily.  . furosemide (LASIX) 40 MG tablet Take 40 mg by mouth daily.  . hydrochlorothiazide (HYDRODIURIL) 25 MG tablet Take 25 mg by mouth daily.  . Magnesium 500 MG CAPS Take 500 mg by mouth 2 (two) times daily.  . metoprolol  tartrate (LOPRESSOR) 25 MG tablet 1/4 tablet daily PRN for fast HR  . montelukast (SINGULAIR) 10 MG tablet TAKE 1 TABLET(10 MG) BY MOUTH AT BEDTIME  . Multiple Vitamin (MULTIVITAMIN WITH MINERALS) TABS tablet Take 1 tablet by mouth daily with supper.  . Multiple Vitamins-Minerals (PRESERVISION AREDS PO) Take by mouth 2 (two) times daily.  . ondansetron (ZOFRAN) 4 MG tablet Take 4 mg by mouth every 6 (six) hours as needed for nausea or vomiting.  Marland Kitchen oxybutynin (DITROPAN) 5 MG tablet Take 5 mg by mouth every evening.   . pramipexole (MIRAPEX) 0.5 MG tablet Take 0.25 mg by mouth 4 (four) times daily.   . sucralfate (CARAFATE) 1 g tablet Take 1 g by mouth 3 (three) times daily.  . SUMAtriptan (IMITREX) 50 MG tablet Take 50 mg by mouth daily as needed for migraine.   Marland Kitchen tiZANidine (ZANAFLEX) 4 MG tablet Take 2 mg by mouth 3 (three) times daily as needed for muscle spasms.  . traMADol (ULTRAM) 50 MG tablet Take 50 mg by mouth 3 (three) times daily as needed for moderate pain.   . traZODone (DESYREL) 100 MG tablet Take 0.5 tablets (50 mg total) by mouth at bedtime as needed for sleep.  . verapamil (VERELAN PM) 180 MG 24 hr capsule Take 180 mg by mouth in the morning and at bedtime.   . vitamin B-12 (CYANOCOBALAMIN) 1000 MCG tablet Take 1,000 mcg by mouth at bedtime.      Review of Systems  All other systems reviewed and are otherwise negative except as noted above.  Physical Exam    VS:  BP 110/70 (BP Location: Left Arm, Patient Position: Sitting, Cuff Size: Normal)   Pulse 77   Ht 5\' 6"  (1.676 m)   Wt 192 lb (87.1 kg)   SpO2 95%   BMI 30.99 kg/m  , BMI Body mass index is 30.99 kg/m.  Wt Readings from Last 3 Encounters:  04/13/20 192 lb (87.1 kg)  03/19/20 187 lb 9.8 oz (85.1 kg)  09/24/19 195 lb (88.5 kg)    GEN: Well nourished, overweight,  well developed, in no acute distress. HEENT: normal. Neck: Supple, no JVD, carotid bruits, or masses. Cardiac: RRR, no murmurs, rubs, or gallops.  No clubbing, cyanosis, edema.  Radials/DP/PT 2+ and equal bilaterally.  Respiratory:  Respirations regular and unlabored, clear to auscultation bilaterally. GI: Soft, nontender, nondistended. MS: No deformity or atrophy. Skin: Warm and dry, no rash. Neuro:  Strength and sensation are intact. Psych: Normal affect. Anxious.  Assessment & Plan    1. PAF/chronic anticoagulation-denies bleeding complications on Eliquis 5 mg twice daily.  Does not meet dose reduction criteria.  Continue Verapamil 180mg  twice daily. Continue Metoprolol PRN.  She reports intermittent sensation of tachycardia with heart rate 1 monitor at home 95 bpm.  Encouraged to utilize her as needed metoprolol tartrate.  We discussed that caffeine, recent illness, dehydration could be contributory to her tachycardia.  Suggested she [facility] the [facility] with her family to see if this would offer some reassurance to be able to check an EKG at home.  2. Diastolic heart failure-euvolemic and well compensated on exam.  No spironolactone due to history of hyperkalemia.  Continue Lasix 40 mg daily, hydrochlorothiazide 25 mg daily.  3. CKD 3B-careful titration of diuretics and antihypertensives.   Disposition: Follow up in 4 month(s) with Dr. Mariah MillingGollan or APP  Signed, Alver Sorrowaitlin S Sonia Bromell, NP 04/13/2020, 12:00 PM [facility]

## 2020-04-13 NOTE — Patient Instructions (Addendum)
Medication Instructions:  None ordered today.   *If you need a refill on your cardiac medications before your next appointment, please call your pharmacy*  Lab Work: None ordered today  Testing/Procedures: Your EKG today shows normal sinus rhythm.   Follow-Up: At [facility], you and your health needs are our priority.  As part of our continuing mission to provide you with exceptional heart care, we have created designated Provider Care Teams.  These Care Teams include your primary Cardiologist (physician) and Advanced Practice Providers (APPs -  Physician Assistants and Nurse Practitioners) who all work together to provide you with the care you need, when you need it.  We recommend signing up for the patient portal called "MyChart".  Sign up information is provided on this After Visit Summary.  MyChart is used to connect with patients for Virtual Visits (Telemedicine).  Patients are able to view lab/test results, encounter notes, upcoming appointments, etc.  Non-urgent messages can be sent to your provider as well.   To learn more about what you can do with MyChart, go to [email or web address].    Your next appointment:   4 month(s)  The format for your next appointment:   In Person  Provider:   You may see Julien Nordmann, MD or one of the following Advanced Practice Providers on your designated Care Team:    Nicolasa Ducking, NP  Eula Listen, PA-C  Marisue Ivan, PA-C  Cadence Fransico Michael, [place]  Gillian Shields, NP  Other Instructions  Continue to keep your legs up when sitting. This helps prevent swelling.   Call our office if you gain 3 pounds overnight or 5 pounds in one week.   Recommend using a heat pack on your back.   Keep using your incentive spirometer (the blue breathing machine).   The Albuterol (rescue) inhaler is likely causing the palpitations. Only use as-needed if you are very short of breath.   A normal resting heart rate is 55bpm - 100bpm.    Palpitations can also be caused by dehydration, caffeine. Try to switch to a caffeine free drink such as Sprite, Ginger Ale, or orange soda.   Kardia for monitoring of EKGs at home: You can look into the [facility] device by [employer].This device is purchased by you and it connects to an application you download to your smart phone.  It can detect abnormal heart rhythms and alert you to contact your doctor for further evaluation. The device is approximately $90 and the phone application is free.  The web site is:  [email or web address]://www.alivecor.com

## 2020-04-30 ENCOUNTER — Other Ambulatory Visit: Payer: Self-pay | Admitting: Cardiovascular Disease

## 2020-05-29 ENCOUNTER — Ambulatory Visit: Payer: Medicare Other | Admitting: Cardiovascular Disease
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
## 2020-06-05 ENCOUNTER — Encounter: Payer: Self-pay | Admitting: Cardiovascular Disease

## 2020-06-05 ENCOUNTER — Ambulatory Visit (INDEPENDENT_AMBULATORY_CARE_PROVIDER_SITE_OTHER): Payer: Medicare Other | Admitting: Cardiovascular Disease

## 2020-06-05 ENCOUNTER — Telehealth: Payer: Self-pay

## 2020-06-05 ENCOUNTER — Other Ambulatory Visit: Payer: Self-pay

## 2020-06-05 VITALS — BP 130/62 | HR 82 | Ht 66.0 in | Wt 198.0 lb

## 2020-06-05 DIAGNOSIS — N1832 Chronic kidney disease, stage 3b: Secondary | ICD-10-CM | POA: Diagnosis not present

## 2020-06-05 DIAGNOSIS — Z7901 Long term (current) use of anticoagulants: Secondary | ICD-10-CM | POA: Diagnosis not present

## 2020-06-05 DIAGNOSIS — I480 Paroxysmal atrial fibrillation: Secondary | ICD-10-CM | POA: Diagnosis not present

## 2020-06-05 DIAGNOSIS — I10 Essential (primary) hypertension: Secondary | ICD-10-CM

## 2020-06-05 DIAGNOSIS — R002 Palpitations: Secondary | ICD-10-CM

## 2020-06-05 DIAGNOSIS — I5032 Chronic diastolic (congestive) heart failure: Secondary | ICD-10-CM

## 2020-06-05 NOTE — Telephone Encounter (Signed)
Pt dropped off her Elquis assistance application today at Dr. Windell Hummingbird f/u visit, pt completed her portion, this RN competed her medication profile portion, Dr. Mariah Milling signed and application was faxed. Forms placed in filing cabinet.

## 2020-06-05 NOTE — Patient Instructions (Signed)
Medication Instructions:  No changes  If you need a refill on your cardiac medications before your next appointment, please call your pharmacy.    Lab work: No new labs needed   If you have labs (blood work) drawn today and your tests are completely normal, you will receive your results only by: Marland Kitchen MyChart Message (if you have MyChart) OR . A paper copy in the mail If you have any lab test that is abnormal or we need to change your treatment, we will call you to review the results.   Testing/Procedures: No new testing needed   Follow-Up: At [facility], you and your health needs are our priority.  As part of our continuing mission to provide you with exceptional heart care, we have created designated Provider Care Teams.  These Care Teams include your primary Cardiologist (physician) and Advanced Practice Providers (APPs -  Physician Assistants and Nurse Practitioners) who all work together to provide you with the care you need, when you need it.  You will need a follow up appointment in 6 months APP ok  . Providers on your designated Care Team:   . Ann Ducking, NP . Ann Listen, PA-C . Ann Ivan, PA-C  Any Other Special Instructions Will Be Listed Below (If Applicable).  COVID-19 Vaccine Information can be found at: PodExchange.nl For questions related to vaccine distribution or appointments, please email [email or web address] or call [phone].

## 2020-07-02 ENCOUNTER — Telehealth: Payer: Self-pay

## 2020-07-02 NOTE — Telephone Encounter (Signed)
[name] sent a fax over regarding PA for Eliquis, [name] is not eligible at this time since she has not provided documentation showing she has spent 3% of your annual household income on out-of-pocket prescription expenses. Based on her household adjusted gross income not being met. Letter was also sent out to [name]. Once [name] can show documentation of her out-of-pocket 3% expenses [name] can re-evaluate PA for Eliquis.

## 2020-07-29 ENCOUNTER — Other Ambulatory Visit: Payer: Self-pay | Admitting: Cardiovascular Disease

## 2020-07-29 NOTE — Telephone Encounter (Signed)
Rx request sent to pharmacy.  

## 2020-08-20 ENCOUNTER — Other Ambulatory Visit: Payer: Self-pay | Admitting: Orthopedic Surgery

## 2020-08-20 DIAGNOSIS — S46001A Unspecified injury of muscle(s) and tendon(s) of the rotator cuff of right shoulder, initial encounter: Secondary | ICD-10-CM

## 2020-09-01 ENCOUNTER — Ambulatory Visit
Admission: RE | Admit: 2020-09-01 | Discharge: 2020-09-01 | Disposition: A | Discharge status: Home / Self Care | Payer: Medicare Other | Source: Ambulatory Visit | Attending: Orthopedic Surgery | Admitting: Orthopedic Surgery

## 2020-09-01 ENCOUNTER — Other Ambulatory Visit: Payer: Self-pay

## 2020-09-01 DIAGNOSIS — S46001A Unspecified injury of muscle(s) and tendon(s) of the rotator cuff of right shoulder, initial encounter: Secondary | ICD-10-CM | POA: Insufficient documentation

## 2020-09-06 ENCOUNTER — Other Ambulatory Visit: Payer: Self-pay

## 2020-09-06 ENCOUNTER — Encounter: Payer: Self-pay | Admitting: Family Medicine

## 2020-09-06 ENCOUNTER — Emergency Department: Payer: Medicare Other

## 2020-09-06 ENCOUNTER — Inpatient Hospital Stay
Admission: EM | Admit: 2020-09-06 | Discharge: 2020-09-15 | DRG: 177 | Disposition: A | Discharge status: Home Health | Payer: Medicare Other | Attending: Internal Medicine | Admitting: Internal Medicine

## 2020-09-06 DIAGNOSIS — D649 Anemia, unspecified: Secondary | ICD-10-CM | POA: Diagnosis present

## 2020-09-06 DIAGNOSIS — I480 Paroxysmal atrial fibrillation: Secondary | ICD-10-CM | POA: Diagnosis present

## 2020-09-06 DIAGNOSIS — Z882 Allergy status to sulfonamides status: Secondary | ICD-10-CM

## 2020-09-06 DIAGNOSIS — I110 Hypertensive heart disease with heart failure: Secondary | ICD-10-CM | POA: Diagnosis present

## 2020-09-06 DIAGNOSIS — J1282 Pneumonia due to coronavirus disease 2019: Secondary | ICD-10-CM | POA: Diagnosis present

## 2020-09-06 DIAGNOSIS — E876 Hypokalemia: Secondary | ICD-10-CM | POA: Diagnosis not present

## 2020-09-06 DIAGNOSIS — F419 Anxiety disorder, unspecified: Secondary | ICD-10-CM | POA: Diagnosis present

## 2020-09-06 DIAGNOSIS — N179 Acute kidney failure, unspecified: Secondary | ICD-10-CM | POA: Diagnosis present

## 2020-09-06 DIAGNOSIS — I959 Hypotension, unspecified: Secondary | ICD-10-CM | POA: Diagnosis present

## 2020-09-06 DIAGNOSIS — Z885 Allergy status to narcotic agent status: Secondary | ICD-10-CM

## 2020-09-06 DIAGNOSIS — I5033 Acute on chronic diastolic (congestive) heart failure: Secondary | ICD-10-CM

## 2020-09-06 DIAGNOSIS — R296 Repeated falls: Secondary | ICD-10-CM | POA: Diagnosis present

## 2020-09-06 DIAGNOSIS — J019 Acute sinusitis, unspecified: Secondary | ICD-10-CM | POA: Diagnosis present

## 2020-09-06 DIAGNOSIS — W19XXXA Unspecified fall, initial encounter: Secondary | ICD-10-CM | POA: Diagnosis present

## 2020-09-06 DIAGNOSIS — U071 COVID-19: Secondary | ICD-10-CM | POA: Diagnosis present

## 2020-09-06 DIAGNOSIS — E519 Thiamine deficiency, unspecified: Secondary | ICD-10-CM | POA: Diagnosis present

## 2020-09-06 DIAGNOSIS — Z7951 Long term (current) use of inhaled steroids: Secondary | ICD-10-CM

## 2020-09-06 DIAGNOSIS — J309 Allergic rhinitis, unspecified: Secondary | ICD-10-CM | POA: Diagnosis present

## 2020-09-06 DIAGNOSIS — Z880 Allergy status to penicillin: Secondary | ICD-10-CM

## 2020-09-06 DIAGNOSIS — M62838 Other muscle spasm: Secondary | ICD-10-CM | POA: Diagnosis present

## 2020-09-06 DIAGNOSIS — Z7901 Long term (current) use of anticoagulants: Secondary | ICD-10-CM | POA: Diagnosis not present

## 2020-09-06 DIAGNOSIS — F32A Depression, unspecified: Secondary | ICD-10-CM | POA: Diagnosis present

## 2020-09-06 DIAGNOSIS — Z87891 Personal history of nicotine dependence: Secondary | ICD-10-CM

## 2020-09-06 DIAGNOSIS — J9601 Acute respiratory failure with hypoxia: Secondary | ICD-10-CM | POA: Diagnosis present

## 2020-09-06 DIAGNOSIS — J9600 Acute respiratory failure, unspecified whether with hypoxia or hypercapnia: Secondary | ICD-10-CM | POA: Diagnosis not present

## 2020-09-06 DIAGNOSIS — D638 Anemia in other chronic diseases classified elsewhere: Secondary | ICD-10-CM

## 2020-09-06 DIAGNOSIS — Z881 Allergy status to other antibiotic agents status: Secondary | ICD-10-CM

## 2020-09-06 DIAGNOSIS — S46011A Strain of muscle(s) and tendon(s) of the rotator cuff of right shoulder, initial encounter: Secondary | ICD-10-CM | POA: Diagnosis present

## 2020-09-06 DIAGNOSIS — K219 Gastro-esophageal reflux disease without esophagitis: Secondary | ICD-10-CM | POA: Diagnosis present

## 2020-09-06 DIAGNOSIS — Z90710 Acquired absence of both cervix and uterus: Secondary | ICD-10-CM

## 2020-09-06 DIAGNOSIS — Z79899 Other long term (current) drug therapy: Secondary | ICD-10-CM

## 2020-09-06 DIAGNOSIS — E531 Pyridoxine deficiency: Secondary | ICD-10-CM | POA: Diagnosis present

## 2020-09-06 DIAGNOSIS — I509 Heart failure, unspecified: Secondary | ICD-10-CM

## 2020-09-06 DIAGNOSIS — G2581 Restless legs syndrome: Secondary | ICD-10-CM | POA: Diagnosis present

## 2020-09-06 DIAGNOSIS — B001 Herpesviral vesicular dermatitis: Secondary | ICD-10-CM | POA: Diagnosis present

## 2020-09-06 DIAGNOSIS — R0602 Shortness of breath: Secondary | ICD-10-CM

## 2020-09-06 DIAGNOSIS — I4891 Unspecified atrial fibrillation: Secondary | ICD-10-CM | POA: Diagnosis present

## 2020-09-06 DIAGNOSIS — R0902 Hypoxemia: Secondary | ICD-10-CM

## 2020-09-06 LAB — TROPONIN I (HIGH SENSITIVITY)
Troponin I (High Sensitivity): 79 ng/L — ABNORMAL HIGH (ref <18)
Troponin I (High Sensitivity): 72 ng/L — ABNORMAL HIGH (ref <18)

## 2020-09-06 LAB — TYPE AND SCREEN
ABO/RH(D): A POS
Antibody Screen: NEGATIVE

## 2020-09-06 LAB — BRAIN NATRIURETIC PEPTIDE: B Natriuretic Peptide: 1287.6 pg/mL — ABNORMAL HIGH (ref 0.0–100.0)

## 2020-09-06 LAB — CBC WITH DIFFERENTIAL/PLATELET
Abs Immature Granulocytes: 0.12 10*3/uL — ABNORMAL HIGH (ref 0.00–0.07)
Basophils Absolute: 0 10*3/uL (ref 0.0–0.1)
Basophils Relative: 1 %
Eosinophils Absolute: 0.1 10*3/uL (ref 0.0–0.5)
Eosinophils Relative: 2 %
HCT: 31.4 % — ABNORMAL LOW (ref 36.0–46.0)
Hemoglobin: 10.2 g/dL — ABNORMAL LOW (ref 12.0–15.0)
Immature Granulocytes: 2 %
Lymphocytes Relative: 7 %
Lymphs Abs: 0.5 10*3/uL — ABNORMAL LOW (ref 0.7–4.0)
MCH: 32.5 pg (ref 26.0–34.0)
MCHC: 32.5 g/dL (ref 30.0–36.0)
MCV: 100 fL (ref 80.0–100.0)
Monocytes Absolute: 0.3 10*3/uL (ref 0.1–1.0)
Monocytes Relative: 4 %
Neutro Abs: 6.1 10*3/uL (ref 1.7–7.7)
Neutrophils Relative %: 84 %
Platelets: 254 10*3/uL (ref 150–400)
RBC: 3.14 MIL/uL — ABNORMAL LOW (ref 3.87–5.11)
RDW: 14.7 % (ref 11.5–15.5)
WBC: 7.2 10*3/uL (ref 4.0–10.5)
nRBC: 0 % (ref 0.0–0.2)

## 2020-09-06 LAB — COMPREHENSIVE METABOLIC PANEL
ALT: 15 U/L (ref 0–44)
AST: 19 U/L (ref 15–41)
Albumin: 2.7 g/dL — ABNORMAL LOW (ref 3.5–5.0)
Alkaline Phosphatase: 69 U/L (ref 38–126)
Anion gap: 9 (ref 5–15)
BUN: 68 mg/dL — ABNORMAL HIGH (ref 8–23)
CO2: 24 mmol/L (ref 22–32)
Calcium: 8.9 mg/dL (ref 8.9–10.3)
Chloride: 100 mmol/L (ref 98–111)
Creatinine, Ser: 1.96 mg/dL — ABNORMAL HIGH (ref 0.44–1.00)
GFR, Estimated: 26 mL/min — ABNORMAL LOW (ref >60)
Glucose, Bld: 124 mg/dL — ABNORMAL HIGH (ref 70–99)
Potassium: 3.7 mmol/L (ref 3.5–5.1)
Sodium: 133 mmol/L — ABNORMAL LOW (ref 135–145)
Total Bilirubin: 0.7 mg/dL (ref 0.3–1.2)
Total Protein: 5.8 g/dL — ABNORMAL LOW (ref 6.5–8.1)

## 2020-09-06 LAB — D-DIMER, QUANTITATIVE: D-Dimer, Quant: 0.41 ug/mL-FEU (ref 0.00–0.50)

## 2020-09-06 LAB — LACTATE DEHYDROGENASE: LDH: 217 U/L — ABNORMAL HIGH (ref 98–192)

## 2020-09-06 LAB — C-REACTIVE PROTEIN: CRP: 14.5 mg/dL — ABNORMAL HIGH (ref <1.0)

## 2020-09-06 LAB — FIBRINOGEN: Fibrinogen: 694 mg/dL — ABNORMAL HIGH (ref 210–475)

## 2020-09-06 LAB — RESP PANEL BY RT-PCR (FLU A&B, COVID) ARPGX2
Influenza A by PCR: NEGATIVE
Influenza B by PCR: NEGATIVE
SARS Coronavirus 2 by RT PCR: POSITIVE — AB

## 2020-09-06 LAB — PROCALCITONIN: Procalcitonin: 0.1 ng/mL

## 2020-09-06 LAB — HEPATITIS B SURFACE ANTIGEN: Hepatitis B Surface Ag: NONREACTIVE

## 2020-09-06 LAB — FERRITIN: Ferritin: 72 ng/mL (ref 11–307)

## 2020-09-06 MED ORDER — CALCIUM CARBONATE-VITAMIN D 500-200 MG-UNIT PO TABS
1.0000 | ORAL_TABLET | Freq: Two times a day (BID) | ORAL | Status: DC
  Administered 2020-09-06 – 2020-09-13 (×16): 1 via ORAL
  Filled 2020-09-06 (×22): qty 1

## 2020-09-06 MED ORDER — APIXABAN 5 MG PO TABS
5.0000 mg | ORAL_TABLET | Freq: Two times a day (BID) | ORAL | Status: DC
  Administered 2020-09-06 – 2020-09-15 (×19): 5 mg via ORAL
  Filled 2020-09-06 (×19): qty 1

## 2020-09-06 MED ORDER — SODIUM CHLORIDE 0.9 % IV SOLN: 200.0000 mg | Freq: Once | INTRAVENOUS | Status: DC

## 2020-09-06 MED ORDER — SODIUM CHLORIDE 0.9 % IV SOLN: 100.0000 mg | Freq: Every day | INTRAVENOUS | Status: DC

## 2020-09-06 MED ORDER — TRAMADOL HCL 50 MG PO TABS: 50.0000 mg | ORAL_TABLET | Freq: Three times a day (TID) | ORAL | Status: DC | PRN

## 2020-09-06 MED ORDER — TIZANIDINE HCL 2 MG PO TABS
2.0000 mg | ORAL_TABLET | Freq: Three times a day (TID) | ORAL | Status: DC | PRN
  Administered 2020-09-08 – 2020-09-14 (×5): 2 mg via ORAL
  Filled 2020-09-06 (×8): qty 1

## 2020-09-06 MED ORDER — SODIUM CHLORIDE 0.9 % IV SOLN
200.0000 mg | Freq: Once | INTRAVENOUS | Status: AC
  Administered 2020-09-06: 200 mg via INTRAVENOUS
  Filled 2020-09-06: qty 40

## 2020-09-06 MED ORDER — SODIUM CHLORIDE 0.9 % IV SOLN
250.0000 mL | INTRAVENOUS | Status: DC | PRN
  Administered 2020-09-10: 250 mL via INTRAVENOUS

## 2020-09-06 MED ORDER — SODIUM CHLORIDE 0.9 % IV SOLN
100.0000 mg | Freq: Every day | INTRAVENOUS | Status: AC
  Administered 2020-09-07 – 2020-09-10 (×4): 100 mg via INTRAVENOUS
  Filled 2020-09-06 (×4): qty 100

## 2020-09-06 MED ORDER — FLECAINIDE ACETATE 50 MG PO TABS
50.0000 mg | ORAL_TABLET | Freq: Two times a day (BID) | ORAL | Status: DC
  Administered 2020-09-06 – 2020-09-15 (×18): 50 mg via ORAL
  Filled 2020-09-06 (×21): qty 1

## 2020-09-06 MED ORDER — BUDESONIDE 1 MG/2ML IN SUSP: 1.0000 mg | Freq: Every day | RESPIRATORY_TRACT | Status: DC

## 2020-09-06 MED ORDER — FLUTICASONE PROPIONATE HFA 220 MCG/ACT IN AERO
1.0000 | INHALATION_SPRAY | Freq: Two times a day (BID) | RESPIRATORY_TRACT | Status: DC
  Administered 2020-09-06 – 2020-09-15 (×19): 1 via RESPIRATORY_TRACT
  Filled 2020-09-06: qty 12

## 2020-09-06 MED ORDER — PRAMIPEXOLE DIHYDROCHLORIDE 0.25 MG PO TABS
0.2500 mg | ORAL_TABLET | Freq: Four times a day (QID) | ORAL | Status: DC
  Administered 2020-09-06 – 2020-09-07 (×4): 0.25 mg via ORAL
  Filled 2020-09-06 (×7): qty 1

## 2020-09-06 MED ORDER — BUMETANIDE 0.25 MG/ML IJ SOLN
1.0000 mg | Freq: Once | INTRAMUSCULAR | Status: AC
  Administered 2020-09-06: 1 mg via INTRAVENOUS
  Filled 2020-09-06: qty 4

## 2020-09-06 MED ORDER — METHYLPREDNISOLONE SODIUM SUCC 125 MG IJ SOLR
1.0000 mg/kg | Freq: Two times a day (BID) | INTRAMUSCULAR | Status: AC
  Administered 2020-09-06 – 2020-09-08 (×6): 85 mg via INTRAVENOUS
  Filled 2020-09-06 (×6): qty 2

## 2020-09-06 MED ORDER — OXYBUTYNIN CHLORIDE 5 MG PO TABS
5.0000 mg | ORAL_TABLET | Freq: Every evening | ORAL | Status: DC
  Administered 2020-09-06 – 2020-09-14 (×9): 5 mg via ORAL
  Filled 2020-09-06 (×11): qty 1

## 2020-09-06 MED ORDER — SUMATRIPTAN SUCCINATE 50 MG PO TABS
50.0000 mg | ORAL_TABLET | Freq: Every day | ORAL | Status: DC | PRN
  Filled 2020-09-06: qty 1

## 2020-09-06 MED ORDER — VITAMIN B-12 1000 MCG PO TABS
1000.0000 ug | ORAL_TABLET | Freq: Every day | ORAL | Status: DC
  Administered 2020-09-06 – 2020-09-14 (×9): 1000 ug via ORAL
  Filled 2020-09-06 (×10): qty 1

## 2020-09-06 MED ORDER — LORATADINE 10 MG PO TABS
10.0000 mg | ORAL_TABLET | Freq: Every day | ORAL | Status: DC
  Administered 2020-09-06 – 2020-09-15 (×10): 10 mg via ORAL
  Filled 2020-09-06 (×9): qty 1

## 2020-09-06 MED ORDER — METOPROLOL TARTRATE 25 MG PO TABS
12.5000 mg | ORAL_TABLET | Freq: Every day | ORAL | Status: DC | PRN
  Administered 2020-09-06: 12.5 mg via ORAL
  Filled 2020-09-06: qty 1

## 2020-09-06 MED ORDER — ONDANSETRON HCL 4 MG PO TABS: 4.0000 mg | ORAL_TABLET | Freq: Four times a day (QID) | ORAL | Status: DC | PRN

## 2020-09-06 MED ORDER — CARBAMAZEPINE ER 100 MG PO TB12
100.0000 mg | ORAL_TABLET | Freq: Two times a day (BID) | ORAL | Status: DC
  Administered 2020-09-06 – 2020-09-15 (×19): 100 mg via ORAL
  Filled 2020-09-06 (×21): qty 1

## 2020-09-06 MED ORDER — TRAZODONE HCL 50 MG PO TABS: 50.0000 mg | ORAL_TABLET | Freq: Every evening | ORAL | Status: DC | PRN

## 2020-09-06 MED ORDER — COLCHICINE 0.6 MG PO TABS
0.6000 mg | ORAL_TABLET | Freq: Every day | ORAL | Status: DC | PRN
  Filled 2020-09-06: qty 1

## 2020-09-06 MED ORDER — AZELASTINE HCL 0.1 % NA SOLN
1.0000 | Freq: Every day | NASAL | Status: DC
  Administered 2020-09-06 – 2020-09-15 (×10): 1 via NASAL
  Filled 2020-09-06: qty 30

## 2020-09-06 MED ORDER — FLUOXETINE HCL 20 MG PO CAPS: 20.0000 mg | ORAL_CAPSULE | Freq: Every day | ORAL | Status: DC

## 2020-09-06 MED ORDER — FLUTICASONE PROPIONATE 50 MCG/ACT NA SUSP
2.0000 | Freq: Two times a day (BID) | NASAL | Status: DC
  Administered 2020-09-06 – 2020-09-15 (×19): 2 via NASAL
  Filled 2020-09-06: qty 16

## 2020-09-06 MED ORDER — MONTELUKAST SODIUM 10 MG PO TABS
10.0000 mg | ORAL_TABLET | Freq: Every day | ORAL | Status: DC
  Administered 2020-09-06 – 2020-09-14 (×9): 10 mg via ORAL
  Filled 2020-09-06 (×10): qty 1

## 2020-09-06 MED ORDER — PREDNISONE 50 MG PO TABS
50.0000 mg | ORAL_TABLET | Freq: Every day | ORAL | Status: DC
  Administered 2020-09-09 – 2020-09-15 (×7): 50 mg via ORAL
  Filled 2020-09-06 (×7): qty 1

## 2020-09-06 MED ORDER — ALBUTEROL SULFATE HFA 108 (90 BASE) MCG/ACT IN AERS
2.0000 | INHALATION_SPRAY | Freq: Four times a day (QID) | RESPIRATORY_TRACT | Status: DC
  Administered 2020-09-06 – 2020-09-15 (×37): 2 via RESPIRATORY_TRACT
  Filled 2020-09-06: qty 6.7

## 2020-09-06 MED ORDER — ONDANSETRON HCL 4 MG/2ML IJ SOLN: 4.0000 mg | Freq: Four times a day (QID) | INTRAMUSCULAR | Status: DC | PRN

## 2020-09-06 MED ORDER — SODIUM CHLORIDE 0.9% FLUSH: 3.0000 mL | INTRAVENOUS | Status: DC | PRN

## 2020-09-06 MED ORDER — ASCORBIC ACID 500 MG PO TABS
1000.0000 mg | ORAL_TABLET | Freq: Every day | ORAL | Status: DC
  Administered 2020-09-06 – 2020-09-15 (×10): 1000 mg via ORAL
  Filled 2020-09-06 (×10): qty 2

## 2020-09-06 MED ORDER — OCUVITE-LUTEIN PO CAPS
1.0000 | ORAL_CAPSULE | Freq: Every day | ORAL | Status: DC
  Administered 2020-09-06 – 2020-09-15 (×10): 1 via ORAL
  Filled 2020-09-06 (×10): qty 1

## 2020-09-06 MED ORDER — ALPRAZOLAM 0.25 MG PO TABS
0.2500 mg | ORAL_TABLET | Freq: Three times a day (TID) | ORAL | Status: DC | PRN
  Administered 2020-09-06 – 2020-09-15 (×10): 0.25 mg via ORAL
  Filled 2020-09-06 (×11): qty 1

## 2020-09-06 MED ORDER — VERAPAMIL HCL ER 180 MG PO TBCR
180.0000 mg | EXTENDED_RELEASE_TABLET | Freq: Every day | ORAL | Status: DC
  Administered 2020-09-06 – 2020-09-15 (×10): 180 mg via ORAL
  Filled 2020-09-06 (×10): qty 1

## 2020-09-06 MED ORDER — GUAIFENESIN-DM 100-10 MG/5ML PO SYRP
10.0000 mL | ORAL_SOLUTION | ORAL | Status: DC | PRN
  Administered 2020-09-07 – 2020-09-08 (×3): 10 mL via ORAL
  Filled 2020-09-06 (×3): qty 10

## 2020-09-06 MED ORDER — SUCRALFATE 1 G PO TABS
1.0000 g | ORAL_TABLET | Freq: Three times a day (TID) | ORAL | Status: DC
  Administered 2020-09-06 – 2020-09-15 (×27): 1 g via ORAL
  Filled 2020-09-06 (×28): qty 1

## 2020-09-06 MED ORDER — FUROSEMIDE 40 MG PO TABS
40.0000 mg | ORAL_TABLET | Freq: Two times a day (BID) | ORAL | Status: DC
  Administered 2020-09-06 – 2020-09-12 (×13): 40 mg via ORAL
  Filled 2020-09-06 (×13): qty 1

## 2020-09-06 MED ORDER — PANTOPRAZOLE SODIUM 40 MG PO TBEC
40.0000 mg | DELAYED_RELEASE_TABLET | Freq: Every day | ORAL | Status: DC
  Administered 2020-09-06 – 2020-09-15 (×10): 40 mg via ORAL
  Filled 2020-09-06 (×9): qty 1

## 2020-09-06 MED ORDER — MAGNESIUM GLUCONATE 500 MG PO TABS
500.0000 mg | ORAL_TABLET | Freq: Two times a day (BID) | ORAL | Status: DC
  Administered 2020-09-06 – 2020-09-15 (×19): 500 mg via ORAL
  Filled 2020-09-06 (×21): qty 1

## 2020-09-06 MED ORDER — ESTRADIOL 1 MG PO TABS
1.5000 mg | ORAL_TABLET | Freq: Every day | ORAL | Status: DC
  Administered 2020-09-06 – 2020-09-15 (×10): 1.5 mg via ORAL
  Filled 2020-09-06 (×10): qty 1.5

## 2020-09-06 MED ORDER — FLECAINIDE ACETATE 100 MG PO TABS: 100.0000 mg | ORAL_TABLET | Freq: Two times a day (BID) | ORAL | Status: DC

## 2020-09-06 MED ORDER — SODIUM CHLORIDE 0.9% FLUSH
3.0000 mL | Freq: Two times a day (BID) | INTRAVENOUS | Status: DC
  Administered 2020-09-06 – 2020-09-15 (×18): 3 mL via INTRAVENOUS

## 2020-09-06 NOTE — Consult Note (Addendum)
Remdesivir - Pharmacy Brief Note   O:  ALT: 15 CXR: Patchy pulmonary opacity suggesting multifocal pneumonia. SpO2: 90% on 5L   A/P:  Remdesivir 200 mg IVPB once followed by 100 mg IVPB daily x 4 days ordered by MD.   Reatha Armour, PharmD Pharmacy Resident  09/06/2020 9:20 AM

## 2020-09-06 NOTE — Progress Notes (Signed)
Patient arrived to unit in no distress. VSS 

## 2020-09-06 NOTE — ED Notes (Signed)
New orders received, will continue to monitor patient and administer medications as ordered. Patient is resting with bed locked in its lowest position, side rails up x2, call light in reach.

## 2020-09-06 NOTE — ED Notes (Signed)
RT and RN at bedside.

## 2020-09-06 NOTE — ED Notes (Signed)
RN remains at bedside, awaiting RT. Patient is resting, watching tv. Oxygen sat currently 91% on nonrebreather, respiratory rate 35.

## 2020-09-06 NOTE — ED Notes (Addendum)
Patient resting comfortably, requests that I contact her family/POA Max Sane at [phone] to bring her cell phone and glasses. Called family who states they will bring the items and drop them off with nursing staff.  PRN meds given for patient report of feeling as if her heart is racing and anxiety, see MAR.  Patient repositioned, brief changed due to small bowel movement. Purewick in place for urinary management. Bed in low position with wheels locked, side rails up x2, call light in reach.

## 2020-09-06 NOTE — ED Notes (Signed)
US at bedside

## 2020-09-06 NOTE — ED Notes (Signed)
Respiratory at bedside to initiate oxygen therapy via hi flow nasal cannula.

## 2020-09-06 NOTE — ED Notes (Signed)
Pt resting comfortably, current oxygen sat 92%. Still awaiting RT/evaluation of oxygen rates via hiflow. Will continue to monitor. Call light in reach, stretcher locked in low position.

## 2020-09-06 NOTE — ED Notes (Signed)
Unable to attempt to give report to inpatient nurse at this time due to awaiting the arrival of RT and evaluation of patient's oxygen settings on HiFlow [place]. RN remains at bedside. Patient continues to be able to effortlessly maintain conversation, watching tv comfortably in bed.

## 2020-09-06 NOTE — ED Notes (Signed)
RN to bedside due to monitor showing oxygen sat of 88% with use of HiFlow oxygen. Probes checked and are working properly. Patient does not appear to be in respiratory distress, is talking easily to myself. RT called to evaluate oxygen levels, will place nonrebreather on patient until RT arrives.  On nonrebreather at 15LPM, oxygen sat is 91.

## 2020-09-06 NOTE — ED Notes (Signed)
RN to bedside, pt states she feels as if her heart is racing. She states "I have been watching my heart rate and it is jumping around everywhere, I feel like it is racing and I feel it in my head". She also reports "I take a PRN dose of metoprolol at home when this happens so I do not go into A Fib". Messaged Dr Lucile Shutters, MD and RN remains at bedside awaiting orders. See charted vital signs. Patient denies shortness of breath or chest pain.

## 2020-09-06 NOTE — ED Notes (Signed)
RN to pt's bedside, oxygen sat reading between 84-88 on 5L of oxygen via nasal cannula. Pt is in no acute distress, tachypnic with RR of 22. Increased oxygen to 6L via nasal cannula and oxygen sat increased to 90%, will notify MD and continue to monitor.

## 2020-09-06 NOTE — ED Notes (Signed)
Oxygen parameters of high flow nasal cannula titrated by RT. See charted vital signs, pt continues to rest comfortably with stretcher in low position and wheels locked. Side rails up x2, call light in reach.

## 2020-09-06 NOTE — ED Notes (Signed)
Messaged Tochukwu Agbata, MD to inform her of patient's respiratory status. Will contact respiratory to initiate hi flow nasal cannula oxygen therapy.

## 2020-09-06 NOTE — Progress Notes (Signed)
RT note:Patient was seen in ER by RT prior to moving to floor. O2 increased on HF due to desat. Patient was not in any distress however. Incentive and flutter instruction performed with patient. Patient transferred to 32 with RN without incident.

## 2020-09-06 NOTE — ED Notes (Addendum)
Chief Complaint  Patient presents with  . Shortness of Breath    Pt presenting via EMS from home. Per EMS report, pt has had on and off SOB yesterday since around lunch time yesterday. Pt became more altered so her friend called EMS. SpO2 45% on fire dept arrival. Pt placed on 15L NRB with improvement in mental status and SpO2   Pt presenting with the above complaint. On arrival pt AAOx4, follows commands and answers questions appropriately. On RA, pt SpO2 60s, placed on 4L [place] initially, SpO2 in 80s after several minutes so increased O2 to 5L [place]. Per EMS report pt's BP was 80/40 on arrival, they gave 1L NS which improved BP to 103/56. Pt noted to have bilateral lower extremity edema that she reports has worsened recently. Pt reports she has R torn rotator cuff and R torn muscle in groin. Pt gowned, placed on monitoring. Bed low, locked. Call bell in reach. Pt's shirt placed in belongings bag

## 2020-09-06 NOTE — ED Notes (Signed)
Linens changed, brief changed, peri care performed due to bowel movement. New purewick placed for urinary management/measurement. Patient given dinner tray. Bed locked in low position, side rails up x2, call light in reach.

## 2020-09-06 NOTE — H&P (Addendum)
History and Physical    Ann Fellersnn B Dhami WGN:[id number] DOB: 08-27-1940 DOA: 09/06/2020  PCP: Danella PentonMiller, Mark F, MD   Patient coming from: Home  I have personally briefly reviewed patient's old medical records in [facility]  Chief Complaint: Shortness of breath  HPI: [name] is a 80 y.o. female with medical history significant for atrial fibrillation on chronic anticoagulation therapy, history of CHF, hypertension who was brought into the ER by EMS for evaluation of shortness of breath and hypoxia. Patient states that she has had symptoms for couple of days which include generalized weakness, headache, myalgias, nonproductive cough and then shortness of breath which started on the night prior to her admission. She checked her pulse oximetry at home and her pulse ox was 45% on room air.  Her friend also checked her blood pressure and she was hypotensive with systolic blood pressure in the 90s and so EMS was called. Per EMS patient was hypotensive with systolic blood pressure in the 80s and patient received IV fluids and was transported to the ER on a nonrebreather mask. Patient has an injury to her right shoulder and has had limited mobility for the last 2 weeks, she spends most of her time sitting in a recliner.  She has bilateral lower extremity swelling which she attributes to her immobility. She has had chills but denies having any fever, no abdominal pain, no nausea, no vomiting, no diarrhea, no constipation, no dizziness, no lightheadedness, no blurred vision, no anorexia, no headache, no palpitations, no diaphoresis Labs show sodium 133, potassium 3.7, chloride 100, bicarb 24, BUN 68, creatinine 1.96, calcium 8.9, alkaline phosphatase 69, albumin 2.7, AST 19, ALT 15, total protein 5.8, BNP 1287, LDH 217, troponin 72, ferritin 72, procalcitonin 0.10, white count 7.2, hemoglobin 10.2, hematocrit 31.4, MCV 100, RDW 14.7, platelet count 254, D-dimer 0.4, fibrinogen 694 Patient SARS coronavirus  2 point-of-care test is positive Chest x-ray reviewed by me shows patchy pulmonary opacity suggesting multifocal pneumonia. Cardiomegaly without typical signs of failure. Lower extremity venous Doppler shows no evidence of deep venous thrombosis in either lower extremity. Mild diffuse soft tissue/interstitial edema within the lower calves bilaterally. Twelve-lead EKG reviewed by me shows sinus rhythm with prolonged PR interval.     ED Course: Patient is a 80 year old Caucasian female who presents to the ER via EMS for evaluation of shortness of breath.  She has had symptoms for couple of days which include generalized weakness, myalgias, headache and mild shortness of breath.   She was hypoxic in the field with room air pulse oximetry 45% and was transported to the ER on a nonrebreather mask.  She is currently on 5 L of oxygen and maintaining pulse oximetry greater than 92%. Her SARS coronavirus 2 point-of-care test is positive.  Patient received 3 doses of the COVID-19 vaccine. She received remdesivir, Solu-Medrol and a dose of Bumex in the ER. She will be admitted to the hospital for further evaluation and treatment     Review of Systems: As per HPI otherwise all other systems reviewed and negative.    Past Medical History:  Diagnosis Date  . A-fib (HCC) 2012, 2014  . Anxiety   . Arrhythmia    A-fib  . Bursitis    hips  . Chronic diastolic (congestive) heart failure (HCC)   . Concussion   . Concussion July, 28, 2014  . GERD (gastroesophageal reflux disease)   . Hypertension   . Palpitations   . Pneumonia 2012  Past Surgical History:  Procedure Laterality Date  . ABDOMINAL SURGERY  2008   abdominal muscle mesh insert  . Arm surgery  2015   Pin implanted   . CATARACT EXTRACTION Right August 30, 2013  . CATARACT EXTRACTION Left August 09, 2013  . COLONOSCOPY    . ESOPHAGOGASTRODUODENOSCOPY    . HERNIA REPAIR Left 1970  . HERNIA REPAIR  2015   Dr. Malissa Hippo  . KNEE  SURGERY Right 1980  . NOSE SURGERY    . SHOULDER SURGERY Right 2000  . TOTAL ABDOMINAL HYSTERECTOMY  1995     reports that she has never smoked. She has quit using smokeless tobacco. She reports that she does not drink alcohol and does not use drugs.  Allergies  Allergen Reactions  . Penicillins Hives    Did it involve swelling of the face/tongue/throat, SOB, or low BP? NO Did it involve sudden or severe rash/hives, skin peeling, or any reaction on the inside of your mouth or nose? Yes. Pt reports severe hives (blistering) from neck to her stomach Did you need to seek medical attention at a hospital or doctor's office? Unknown When did it last happen?>36 years ago If all above answers are "NO", may proceed with cephalosporin use.  . Amlodipine Rash  . Clarithromycin Rash  . Minocycline Other (See Comments)    Onset 04/10/2001.  / unknown reaction  . Propoxyphene Hives    Onset 04/10/2001.   . Ace Inhibitors Cough  . Norvasc [Amlodipine Besylate] Hives  . Vicodin [Hydrocodone-Acetaminophen] Hives    Hives occur after 5 doses   . Betadine [Povidone Iodine] Rash and Other (See Comments)    blisters  . Iodine Rash  . Sulfa Antibiotics Diarrhea and Nausea Only    Family History  Adopted: Yes  Problem Relation Age of Onset  . Dementia Neg Hx        ADOPTED      Prior to Admission medications   Medication Sig Start Date End Date Taking? Authorizing Provider  albuterol (VENTOLIN HFA) 108 (90 Base) MCG/ACT inhaler Inhale 1-2 puffs into the lungs every 6 (six) hours as needed for wheezing or shortness of breath.    [provider]  Albuterol Sulfate 2.5 MG/0.5ML NEBU Inhale into the lungs. 2.5mg /24mL daily    [provider]  ALPRAZolam (XANAX) 0.25 MG tablet Take 0.25 mg by mouth 3 (three) times daily as needed for anxiety.     [provider]  Ascorbic Acid (VITAMIN C) 1000 MG tablet Take 1,000 mg by mouth daily.    [provider]   azelastine (ASTELIN) 0.1 % nasal spray Place 1 spray into both nostrils daily. Use in each nostril as directed    [provider]  budesonide (PULMICORT) 1 MG/2ML nebulizer solution Take 1 mg by nebulization daily.    [provider]  Calcium Carb-Cholecalciferol 600-400 MG-UNIT TABS Take 1 tablet by mouth 2 (two) times daily.    [provider]  carbamazepine (TEGRETOL XR) 100 MG 12 hr tablet Take 100 mg by mouth 2 (two) times daily.    [provider]  celecoxib (CELEBREX) 100 MG capsule TAKE 1 CAPSULE(100 MG) BY MOUTH TWICE DAILY 09/13/19   [provider]  Cetirizine HCl 10 MG CAPS Take 10 mg by mouth daily.    [provider]  colchicine 0.6 MG tablet Take 1 tablet (0.6 mg total) by mouth daily as needed (gout flare). 06/19/19   Enedina Finner, MD  ELIQUIS 5 MG  TABS tablet TAKE 1 TABLET(5 MG) BY MOUTH TWICE DAILY 07/29/20   Antonieta Iba, MD  esomeprazole (NEXIUM) 20 MG capsule Take 20 mg by mouth daily before breakfast.     [provider]  estradiol (ESTRACE) 1 MG tablet Take 1.5 mg by mouth daily.     [provider]  flecainide (TAMBOCOR) 100 MG tablet TAKE 1 TABLET BY MOUTH TWICE DAILY 07/29/20   Antonieta Iba, MD  FLUoxetine (PROZAC) 20 MG capsule Take 20 mg by mouth daily.    [provider]  fluticasone (FLONASE) 50 MCG/ACT nasal spray Place 2 sprays into both nostrils 2 (two) times daily.    [provider]  furosemide (LASIX) 40 MG tablet Take 40 mg by mouth 2 (two) times daily.    [provider]  hydrochlorothiazide (HYDRODIURIL) 25 MG tablet Take 25 mg by mouth daily. 04/01/20 04/01/21  [provider]  Magnesium 500 MG CAPS Take 500 mg by mouth 2 (two) times daily.    [provider]  metoprolol tartrate (LOPRESSOR) 25 MG tablet 1/4 tablet daily PRN for fast HR    [provider]  montelukast (SINGULAIR) 10 MG tablet TAKE 1 TABLET(10 MG) BY MOUTH AT  BEDTIME 11/21/16   Shane Crutch, MD  Multiple Vitamin (MULTIVITAMIN WITH MINERALS) TABS tablet Take 1 tablet by mouth daily with supper.    [provider]  Multiple Vitamins-Minerals (PRESERVISION AREDS PO) Take by mouth 2 (two) times daily.    [provider]  ondansetron (ZOFRAN) 4 MG tablet Take 4 mg by mouth every 6 (six) hours as needed for nausea or vomiting.    [provider]  oxybutynin (DITROPAN) 5 MG tablet Take 5 mg by mouth every evening.  10/01/14   [provider]  pramipexole (MIRAPEX) 0.5 MG tablet Take 0.25 mg by mouth 4 (four) times daily.     [provider]  sucralfate (CARAFATE) 1 g tablet Take 1 g by mouth 3 (three) times daily. 05/07/19   [provider]  SUMAtriptan (IMITREX) 50 MG tablet Take 50 mg by mouth daily as needed for migraine.    [provider]  tiZANidine (ZANAFLEX) 4 MG tablet Take 2 mg by mouth 3 (three) times daily as needed for muscle spasms. 01/25/19   [provider]  traMADol (ULTRAM) 50 MG tablet Take 50 mg by mouth 3 (three) times daily as needed for moderate pain.     [provider]  traZODone (DESYREL) 100 MG tablet Take 0.5 tablets (50 mg total) by mouth at bedtime as needed for sleep. 03/19/20   Lurene Shadow, MD  verapamil (VERELAN PM) 180 MG 24 hr capsule Take 180 mg by mouth in the morning and at bedtime.  09/11/19 09/10/20  [provider]  vitamin B-12 (CYANOCOBALAMIN) 1000 MCG tablet Take 1,000 mcg by mouth at bedtime.     [provider]    Physical Exam: Vitals:   09/06/20 0353 09/06/20 0400 09/06/20 0430 09/06/20 0500  BP: (!) 101/59 (!) 101/58 (!) 101/55 (!) 106/52  Pulse:  72 70 67  Resp: (!) 24 (!) 26 (!) 23 (!) 21  Temp:      TempSrc:      SpO2:  90% 95% 91%  Weight:      Height:         Vitals:   09/06/20 0353 09/06/20 0400 09/06/20 0430 09/06/20 0500  BP: (!) 101/59 (!) 101/58 (!) 101/55 (!) 106/52  Pulse:  72 70  67   Resp: (!) 24 (!) 26 (!) 23 (!) 21  Temp:      TempSrc:      SpO2:  90% 95% 91%  Weight:      Height:          Constitutional: Alert and oriented x 3. Not in any apparent distress HEENT:      Head: Normocephalic and atraumatic.         Eyes: PERLA, EOMI, Conjunctivae are normal. Sclera is non-icteric.       Mouth/Throat: Mucous membranes are moist.       Neck: Supple with no signs of meningismus. Cardiovascular: Regular rate and rhythm. No murmurs, gallops, or rubs. 2+ symmetrical distal pulses are present . No JVD. 3+ LE edema Respiratory: Respiratory effort normal .  Faint crackles at the bases bilaterally. No wheezes or rhonchi.  Gastrointestinal: Soft, non tender, and non distended with positive bowel sounds.  Central obesity Genitourinary: No CVA tenderness. Musculoskeletal: Nontender with normal range of motion in all extremities. No cyanosis, or erythema of extremities.  Generalized weakness Neurologic:  Face is symmetric. Moving all extremities. No gross focal neurologic deficits  Skin: Skin is warm, dry.  No rash or ulcers, ecchymosis on both lower extremities Psychiatric: Mood and affect are normal   Labs on Admission: I have personally reviewed following labs and imaging studies  CBC: Recent Labs  Lab 09/06/20 0408  WBC 7.2  NEUTROABS 6.1  HGB 10.2*  HCT 31.4*  MCV 100.0  PLT 254   Basic Metabolic Panel: Recent Labs  Lab 09/06/20 0408  NA 133*  K 3.7  CL 100  CO2 24  GLUCOSE 124*  BUN 68*  CREATININE 1.96*  CALCIUM 8.9   GFR: Estimated Creatinine Clearance: 26 mL/min (A) (by C-G formula based on SCr of 1.96 mg/dL (H)). Liver Function Tests: Recent Labs  Lab 09/06/20 0408  AST 19  ALT 15  ALKPHOS 69  BILITOT 0.7  PROT 5.8*  ALBUMIN 2.7*   No results for input(s): LIPASE, AMYLASE in the last 168 hours. No results for input(s): AMMONIA in the last 168 hours. Coagulation Profile: No results for input(s): INR, PROTIME in the last 168  hours. Cardiac Enzymes: No results for input(s): CKTOTAL, CKMB, CKMBINDEX, TROPONINI in the last 168 hours. BNP (last 3 results) No results for input(s): PROBNP in the last 8760 hours. HbA1C: No results for input(s): HGBA1C in the last 72 hours. CBG: No results for input(s): GLUCAP in the last 168 hours. Lipid Profile: No results for input(s): CHOL, HDL, LDLCALC, TRIG, CHOLHDL, LDLDIRECT in the last 72 hours. Thyroid Function Tests: No results for input(s): TSH, T4TOTAL, FREET4, T3FREE, THYROIDAB in the last 72 hours. Anemia Panel: Recent Labs    09/06/20 0549  FERRITIN 72   Urine analysis:    Component Value Date/Time   COLORURINE YELLOW (A) 03/12/2020 2152   APPEARANCEUR CLEAR (A) 03/12/2020 2152   APPEARANCEUR Clear 04/24/2013 0749   LABSPEC 1.013 03/12/2020 2152   LABSPEC 1.006 04/24/2013 0749   PHURINE 5.0 03/12/2020 2152   GLUCOSEU NEGATIVE 03/12/2020 2152   GLUCOSEU Negative 04/24/2013 0749   HGBUR NEGATIVE 03/12/2020 2152   BILIRUBINUR NEGATIVE 03/12/2020 2152   BILIRUBINUR Negative 04/24/2013 0749   KETONESUR NEGATIVE 03/12/2020 2152   PROTEINUR NEGATIVE 03/12/2020 2152   UROBILINOGEN 0.2 08/20/2010 0521   NITRITE NEGATIVE 03/12/2020 2152   LEUKOCYTESUR NEGATIVE 03/12/2020 2152   LEUKOCYTESUR Negative 04/24/2013 0749    Radiological Exams on Admission: US Venous Img Lower  Bilateral (DVT)  Result Date: 09/06/2020 CLINICAL DATA:  Initial evaluation for bilateral lower extremity swelling. EXAM: BILATERAL LOWER EXTREMITY VENOUS DOPPLER ULTRASOUND TECHNIQUE: Gray-scale sonography with graded compression, as well as color Doppler and duplex ultrasound were performed to evaluate the lower extremity deep venous systems from the level of the common femoral vein and including the common femoral, femoral, profunda femoral, popliteal and calf veins including the posterior tibial, peroneal and gastrocnemius veins when visible. The superficial great saphenous vein was also  interrogated. Spectral Doppler was utilized to evaluate flow at rest and with distal augmentation maneuvers in the common femoral, femoral and popliteal veins. COMPARISON:  None. FINDINGS: RIGHT LOWER EXTREMITY Common Femoral Vein: No evidence of thrombus. Normal compressibility, respiratory phasicity and response to augmentation. Saphenofemoral Junction: No evidence of thrombus. Normal compressibility and flow on color Doppler imaging. Profunda Femoral Vein: No evidence of thrombus. Normal compressibility and flow on color Doppler imaging. Femoral Vein: No evidence of thrombus. Normal compressibility, respiratory phasicity and response to augmentation. Popliteal Vein: No evidence of thrombus. Normal compressibility, respiratory phasicity and response to augmentation. Calf Veins: No evidence of thrombus. Normal compressibility and flow on color Doppler imaging. Superficial Great Saphenous Vein: No evidence of thrombus. Normal compressibility. Venous Reflux:  None. Other Findings: Mild diffuse soft tissue/interstitial edema within the calf. LEFT LOWER EXTREMITY Common Femoral Vein: No evidence of thrombus. Normal compressibility, respiratory phasicity and response to augmentation. Saphenofemoral Junction: No evidence of thrombus. Normal compressibility and flow on color Doppler imaging. Profunda Femoral Vein: No evidence of thrombus. Normal compressibility and flow on color Doppler imaging. Femoral Vein: No evidence of thrombus. Normal compressibility, respiratory phasicity and response to augmentation. Popliteal Vein: No evidence of thrombus. Normal compressibility, respiratory phasicity and response to augmentation. Calf Veins: No evidence of thrombus. Normal compressibility and flow on color Doppler imaging. Superficial Great Saphenous Vein: No evidence of thrombus. Normal compressibility. Venous Reflux:  None. Other Findings: Mild soft tissue/interstitial edema within the calf. IMPRESSION: 1. No evidence of deep  venous thrombosis in either lower extremity. 2. Mild diffuse soft tissue/interstitial edema within the lower calves bilaterally. Electronically Signed   By: Rise Mu M.D.   On: 09/06/2020 04:50   DG Chest Port 1 View  Result Date: 09/06/2020 CLINICAL DATA:  On and off shortness of breath EXAM: PORTABLE CHEST 1 VIEW COMPARISON:  03/12/2020 FINDINGS: Patchy bilateral pulmonary opacity. Cardiomegaly accentuated by large hiatal hernia. No visible effusion or pneumothorax. IMPRESSION: 1. Patchy pulmonary opacity suggesting multifocal pneumonia. 2. Cardiomegaly without typical signs of failure. Electronically Signed   By: Marnee Spring M.D.   On: 09/06/2020 05:27     Assessment/Plan Principal Problem:   Pneumonia due to COVID-19 virus Active Problems:   Atrial fibrillation (HCC)   Acute on chronic diastolic CHF (congestive heart failure) (HCC)   Anxiety   GERD (gastroesophageal reflux disease)   Acute CHF (congestive heart failure) (HCC)   Acute respiratory failure due to COVID-19 (HCC)   Anemia of chronic disease   Frequent falls     Pneumonia due to COVID-19 virus with acute respiratory failure Patient presents for evaluation of shortness of breath as well as generalized weakness, myalgias, headache and chills. Her SARS coronavirus 2 point-of-care test is positive (09/06/20) Patient has received 3 doses of the COVID-19 vaccine  Chest x-ray shows multifocal pneumonia and her room air pulse oximetry was 45%. She is currently on 5 L of oxygen with pulse oximetry greater than 92% Place patient on remdesivir per protocol as well  as IV Solu-Medrol Supportive care with bronchodilator therapy and antitussives Monitor inflammatory markers    Acute on chronic diastolic dysfunction CHF Continue furosemide with holding parameters for blood pressure Maintain low-sodium diet    History of paroxysmal A. Fib Continue flecainide for rate control Continue Eliquis as primary  prophylaxis for an acute stroke    History of depression and anxiety Continue trazodone, alprazolam and fluoxetine    Frequent falls Patient has had 2 falls within the last month and has an injury to the right shoulder related to a recent fall Place patient on fall precautions PT evaluation once stable Continue tramadol as needed for pain control as well as Zanaflex PRN    GERD Continue Protonix   Anemia of chronic disease H&H is stable   DVT prophylaxis: Eliquis Code Status: full code Family Communication: Greater than 50% of time was spent discussing patient's condition and plan of care with her and her healthcare power of attorney at the bedside.  All questions and concerns have been addressed.  She verbalizes understanding and agrees to the plan. Disposition Plan: Back to previous home environment Consults called: none Status: At the time of admission, it appears that the appropriate admission status for this patient is inpatient.  This is judged to be reasonable and necessary in order to provide the required intensity of service to ensure the patient's safety given the presenting symptoms, physical exam findings, and initial radiographic and laboratory data in the context of their comorbid conditions. Patient requires inpatient status due to high intensity of service, high risk for further deterioration and high frequency of surveillance required.    Lucile Shutters MD [facility]     09/06/2020, 9:36 AM
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
## 2020-09-07 LAB — COMPREHENSIVE METABOLIC PANEL
ALT: 14 U/L (ref 0–44)
AST: 22 U/L (ref 15–41)
Albumin: 2.8 g/dL — ABNORMAL LOW (ref 3.5–5.0)
Alkaline Phosphatase: 72 U/L (ref 38–126)
Anion gap: 13 (ref 5–15)
BUN: 50 mg/dL — ABNORMAL HIGH (ref 8–23)
CO2: 29 mmol/L (ref 22–32)
Calcium: 9.5 mg/dL (ref 8.9–10.3)
Chloride: 96 mmol/L — ABNORMAL LOW (ref 98–111)
Creatinine, Ser: 1.31 mg/dL — ABNORMAL HIGH (ref 0.44–1.00)
GFR, Estimated: 42 mL/min — ABNORMAL LOW (ref >60)
Glucose, Bld: 158 mg/dL — ABNORMAL HIGH (ref 70–99)
Potassium: 3.1 mmol/L — ABNORMAL LOW (ref 3.5–5.1)
Sodium: 138 mmol/L (ref 135–145)
Total Bilirubin: 0.8 mg/dL (ref 0.3–1.2)
Total Protein: 6.6 g/dL (ref 6.5–8.1)

## 2020-09-07 LAB — CBC WITH DIFFERENTIAL/PLATELET
Abs Immature Granulocytes: 0.1 10*3/uL — ABNORMAL HIGH (ref 0.00–0.07)
Basophils Absolute: 0 10*3/uL (ref 0.0–0.1)
Basophils Relative: 0 %
Eosinophils Absolute: 0 10*3/uL (ref 0.0–0.5)
Eosinophils Relative: 0 %
HCT: 32.6 % — ABNORMAL LOW (ref 36.0–46.0)
Hemoglobin: 10.9 g/dL — ABNORMAL LOW (ref 12.0–15.0)
Immature Granulocytes: 1 %
Lymphocytes Relative: 3 %
Lymphs Abs: 0.3 10*3/uL — ABNORMAL LOW (ref 0.7–4.0)
MCH: 32.5 pg (ref 26.0–34.0)
MCHC: 33.4 g/dL (ref 30.0–36.0)
MCV: 97.3 fL (ref 80.0–100.0)
Monocytes Absolute: 0.3 10*3/uL (ref 0.1–1.0)
Monocytes Relative: 3 %
Neutro Abs: 8.2 10*3/uL — ABNORMAL HIGH (ref 1.7–7.7)
Neutrophils Relative %: 93 %
Platelets: 309 10*3/uL (ref 150–400)
RBC: 3.35 MIL/uL — ABNORMAL LOW (ref 3.87–5.11)
RDW: 14.2 % (ref 11.5–15.5)
WBC: 8.8 10*3/uL (ref 4.0–10.5)
nRBC: 0 % (ref 0.0–0.2)

## 2020-09-07 LAB — FERRITIN: Ferritin: 85 ng/mL (ref 11–307)

## 2020-09-07 LAB — C-REACTIVE PROTEIN: CRP: 20.1 mg/dL — ABNORMAL HIGH (ref <1.0)

## 2020-09-07 LAB — MAGNESIUM: Magnesium: 1.9 mg/dL (ref 1.7–2.4)

## 2020-09-07 LAB — D-DIMER, QUANTITATIVE: D-Dimer, Quant: 0.58 ug/mL-FEU — ABNORMAL HIGH (ref 0.00–0.50)

## 2020-09-07 LAB — PHOSPHORUS: Phosphorus: 3.1 mg/dL (ref 2.5–4.6)

## 2020-09-07 MED ORDER — ACETAMINOPHEN 500 MG PO TABS
1000.0000 mg | ORAL_TABLET | Freq: Three times a day (TID) | ORAL | Status: DC | PRN
  Administered 2020-09-07 – 2020-09-11 (×5): 1000 mg via ORAL
  Filled 2020-09-07 (×5): qty 2

## 2020-09-07 MED ORDER — POTASSIUM CHLORIDE CRYS ER 20 MEQ PO TBCR
40.0000 meq | EXTENDED_RELEASE_TABLET | Freq: Once | ORAL | Status: AC
  Administered 2020-09-07: 40 meq via ORAL
  Filled 2020-09-07: qty 2

## 2020-09-07 MED ORDER — PRAMIPEXOLE DIHYDROCHLORIDE 0.25 MG PO TABS
0.5000 mg | ORAL_TABLET | Freq: Four times a day (QID) | ORAL | Status: DC
  Administered 2020-09-07 – 2020-09-15 (×33): 0.5 mg via ORAL
  Filled 2020-09-07 (×36): qty 2

## 2020-09-07 NOTE — Progress Notes (Signed)
PROGRESS NOTE    [name]  IPJ:[id number] DOB: 1941/03/04 DOA: 09/06/2020 PCP: Danella Penton, MD  [id number]   Assessment & Plan:   Principal Problem:   Pneumonia due to COVID-19 virus Active Problems:   Atrial fibrillation (HCC)   Acute on chronic diastolic CHF (congestive heart failure) (HCC)   Anxiety   GERD (gastroesophageal reflux disease)   Acute CHF (congestive heart failure) (HCC)   Acute respiratory failure due to COVID-19 (HCC)   Anemia of chronic disease   Frequent falls   [name] is a 80 y.o. female with medical history significant for atrial fibrillation on chronic anticoagulation therapy, history of CHF, hypertension who was brought into the ER by EMS for evaluation of shortness of breath and hypoxia.  Pneumonia due to COVID-19 virus  acute hypoxic respiratory failure Patient has received 3 doses of the COVID-19 vaccine  Chest x-ray shows multifocal pneumonia and her room air pulse oximetry was 45%. --needed 5 L of oxygen on admission, now on heated hf. --started on remdesivir per protocol as well as IV Solu-Medrol Plan: --cont Remdesivir --cont steroid --Ensure CRP down trending --Continue supplemental O2 to keep sats >=90%, wean as tolerated --Supportive care with bronchodilator therapy and antitussives  chronic diastolic dysfunction CHF --does not appear to be acutely exacerbated.  BNP elevated however CXR neg for fluid overload --on Lasix 80 mg daily at home.  Was ordered as 40 mg BID on admission Plan: --cont oral Lasix 40 mg BID, since Cr is trending down  AKI, POA --Cr 1.96 on presentation.  Improved to 1.31 next day. Plan: --cont oral Lasix 40 mg BID ordered on admission, since Cr is trending down  History of paroxysmal A. Fib --cont flecainide and verapamil --cont eliquis  HTN --BP low on presentation, now trending up --cont verapamil and lasix  Restless leg --worsening symptom due to having to be in bed  --increase home  pramipexole to 0.5 mg QID (up from 0.25)  History of depression and anxiety Continue trazodone, alprazolam and fluoxetine  Frequent falls Patient has had 2 falls within the last month and has an injury to the right shoulder related to a recent fall Place patient on fall precautions --PT  GERD Continue Protonix  Anemia of chronic disease H&H is stable   DVT prophylaxis: BH:ALPFXTK Code Status: Full code  Family Communication:  Level of care: Progressive Cardiac Dispo:   The patient is from: home Anticipated d/c is to: home Anticipated d/c date is: > 3 days Patient currently is not medically ready to d/c due to: COVID on heated hf   Subjective and Interval History:  Pt complained of legs hurting and jumping from lying in bed.  Complained of face feeling swollen and whole head hurting.     Objective: Vitals:   09/07/20 1110 09/07/20 1619 09/07/20 1707 09/07/20 2129  BP: (!) 175/79 (!) 157/75  (!) 152/81  Pulse: 98 96  94  Resp: 17 16  18   Temp: 98.3 F (36.8 C)   97.6 F (36.4 C)  TempSrc: Oral   Oral  SpO2: (!) 89% 90% 90% (!) 86%  Weight:      Height:        Intake/Output Summary (Last 24 hours) at 09/08/2020 0204 Last data filed at 09/07/2020 2200 Gross per 24 hour  Intake 741.34 ml  Output 2350 ml  Net -1608.66 ml   Filed Weights   09/06/20 0349 09/06/20 2304  Weight: 84.8 kg 84 kg  Examination:   Constitutional: AAOx3, appeared uncomfortable HEENT: conjunctivae and lids normal, EOMI CV: No cyanosis.   RESP: normal respiratory effort, on heated hf Extremities: No effusions, edema in BLE SKIN: warm, dry Neuro: II - XII grossly intact.   Psych: anxious and depressed mood and affect.     Data Reviewed: I have personally reviewed following labs and imaging studies  CBC: Recent Labs  Lab 09/06/20 0408 09/07/20 0522  WBC 7.2 8.8  NEUTROABS 6.1 8.2*  HGB 10.2* 10.9*  HCT 31.4* 32.6*  MCV 100.0 97.3  PLT 254 309   Basic Metabolic  Panel: Recent Labs  Lab 09/06/20 0408 09/07/20 0522  NA 133* 138  K 3.7 3.1*  CL 100 96*  CO2 24 29  GLUCOSE 124* 158*  BUN 68* 50*  CREATININE 1.96* 1.31*  CALCIUM 8.9 9.5  MG  --  1.9  PHOS  --  3.1   GFR: Estimated Creatinine Clearance: 38.7 mL/min (A) (by C-G formula based on SCr of 1.31 mg/dL (H)). Liver Function Tests: Recent Labs  Lab 09/06/20 0408 09/07/20 0522  AST 19 22  ALT 15 14  ALKPHOS 69 72  BILITOT 0.7 0.8  PROT 5.8* 6.6  ALBUMIN 2.7* 2.8*   No results for input(s): LIPASE, AMYLASE in the last 168 hours. No results for input(s): AMMONIA in the last 168 hours. Coagulation Profile: No results for input(s): INR, PROTIME in the last 168 hours. Cardiac Enzymes: No results for input(s): CKTOTAL, CKMB, CKMBINDEX, TROPONINI in the last 168 hours. BNP (last 3 results) No results for input(s): PROBNP in the last 8760 hours. HbA1C: No results for input(s): HGBA1C in the last 72 hours. CBG: No results for input(s): GLUCAP in the last 168 hours. Lipid Profile: No results for input(s): CHOL, HDL, LDLCALC, TRIG, CHOLHDL, LDLDIRECT in the last 72 hours. Thyroid Function Tests: No results for input(s): TSH, T4TOTAL, FREET4, T3FREE, THYROIDAB in the last 72 hours. Anemia Panel: Recent Labs    09/06/20 0549 09/07/20 0522  FERRITIN 72 85   Sepsis Labs: Recent Labs  Lab 09/06/20 0549  PROCALCITON 0.10    Recent Results (from the past 240 hour(s))  Resp Panel by RT-PCR (Flu A&B, Covid) Nasopharyngeal Swab     Status: Abnormal   Collection Time: 09/06/20  4:08 AM   Specimen: Nasopharyngeal Swab; Nasopharyngeal(NP) swabs in vial transport medium  Result Value Ref Range Status   SARS Coronavirus 2 by RT PCR POSITIVE (A) NEGATIVE Final    Comment: RESULT CALLED TO, READ BACK BY AND VERIFIED WITH: NOTIFIED JESSICA HUDSON RN 09/06/2020 0510 JG (NOTE) SARS-CoV-2 target nucleic acids are DETECTED.  The SARS-CoV-2 RNA is generally detectable in upper  respiratory specimens during the acute phase of infection. Positive results are indicative of the presence of the identified virus, but do not rule out bacterial infection or co-infection with other pathogens not detected by the test. Clinical correlation with patient history and other diagnostic information is necessary to determine patient infection status. The expected result is Negative.  Fact Sheet for Patients: BloggerCourse.com  Fact Sheet for Healthcare Providers: SeriousBroker.it  This test is not yet approved or cleared by the [place] FDA and  has been authorized for detection and/or diagnosis of SARS-CoV-2 by FDA under an Emergency Use Authorization (EUA).  This EUA will remain in effect (meaning this  test can be used) for the duration of  the COVID-19 declaration under Section 564(b)(1) of the Act, 21 U.S.C. section 360bbb-3(b)(1), unless the authorization is terminated or  revoked sooner.     Influenza A by PCR NEGATIVE NEGATIVE Final   Influenza B by PCR NEGATIVE NEGATIVE Final    Comment: (NOTE) The Xpert Xpress SARS-CoV-2/FLU/RSV plus assay is intended as an aid in the diagnosis of influenza from Nasopharyngeal swab specimens and should not be used as a sole basis for treatment. Nasal washings and aspirates are unacceptable for Xpert Xpress SARS-CoV-2/FLU/RSV testing.  Fact Sheet for Patients: BloggerCourse.com  Fact Sheet for Healthcare Providers: SeriousBroker.it  This test is not yet approved or cleared by the [place] FDA and has been authorized for detection and/or diagnosis of SARS-CoV-2 by FDA under an Emergency Use Authorization (EUA). This EUA will remain in effect (meaning this test can be used) for the duration of the COVID-19 declaration under Section 564(b)(1) of the Act, 21 U.S.C. section 360bbb-3(b)(1), unless the authorization is  terminated or revoked.  Performed at [facility], [address], [place], [place] [address]       Radiology Studies: US Venous Img Lower Bilateral (DVT)  Result Date: 09/06/2020 CLINICAL DATA:  Initial evaluation for bilateral lower extremity swelling. EXAM: BILATERAL LOWER EXTREMITY VENOUS DOPPLER ULTRASOUND TECHNIQUE: Gray-scale sonography with graded compression, as well as color Doppler and duplex ultrasound were performed to evaluate the lower extremity deep venous systems from the level of the common femoral vein and including the common femoral, femoral, profunda femoral, popliteal and calf veins including the posterior tibial, peroneal and gastrocnemius veins when visible. The superficial great saphenous vein was also interrogated. Spectral Doppler was utilized to evaluate flow at rest and with distal augmentation maneuvers in the common femoral, femoral and popliteal veins. COMPARISON:  None. FINDINGS: RIGHT LOWER EXTREMITY Common Femoral Vein: No evidence of thrombus. Normal compressibility, respiratory phasicity and response to augmentation. Saphenofemoral Junction: No evidence of thrombus. Normal compressibility and flow on color Doppler imaging. Profunda Femoral Vein: No evidence of thrombus. Normal compressibility and flow on color Doppler imaging. Femoral Vein: No evidence of thrombus. Normal compressibility, respiratory phasicity and response to augmentation. Popliteal Vein: No evidence of thrombus. Normal compressibility, respiratory phasicity and response to augmentation. Calf Veins: No evidence of thrombus. Normal compressibility and flow on color Doppler imaging. Superficial Great Saphenous Vein: No evidence of thrombus. Normal compressibility. Venous Reflux:  None. Other Findings: Mild diffuse soft tissue/interstitial edema within the calf. LEFT LOWER EXTREMITY Common Femoral Vein: No evidence of thrombus. Normal compressibility, respiratory phasicity and response to  augmentation. Saphenofemoral Junction: No evidence of thrombus. Normal compressibility and flow on color Doppler imaging. Profunda Femoral Vein: No evidence of thrombus. Normal compressibility and flow on color Doppler imaging. Femoral Vein: No evidence of thrombus. Normal compressibility, respiratory phasicity and response to augmentation. Popliteal Vein: No evidence of thrombus. Normal compressibility, respiratory phasicity and response to augmentation. Calf Veins: No evidence of thrombus. Normal compressibility and flow on color Doppler imaging. Superficial Great Saphenous Vein: No evidence of thrombus. Normal compressibility. Venous Reflux:  None. Other Findings: Mild soft tissue/interstitial edema within the calf. IMPRESSION: 1. No evidence of deep venous thrombosis in either lower extremity. 2. Mild diffuse soft tissue/interstitial edema within the lower calves bilaterally. Electronically Signed   By: Rise Mu M.D.   On: 09/06/2020 04:50   DG Chest Port 1 View  Result Date: 09/06/2020 CLINICAL DATA:  On and off shortness of breath EXAM: PORTABLE CHEST 1 VIEW COMPARISON:  03/12/2020 FINDINGS: Patchy bilateral pulmonary opacity. Cardiomegaly accentuated by large hiatal hernia. No visible effusion or pneumothorax. IMPRESSION: 1. Patchy pulmonary opacity suggesting  multifocal pneumonia. 2. Cardiomegaly without typical signs of failure. Electronically Signed   By: Marnee SpringJonathon  Watts M.D.   On: 09/06/2020 05:27     Scheduled Meds: . albuterol  2 puff Inhalation Q6H  . apixaban  5 mg Oral BID  . vitamin C  1,000 mg Oral Daily  . azelastine  1 spray Each Nare Daily  . carbamazepine  100 mg Oral BID  . estradiol  1.5 mg Oral Daily  . flecainide  50 mg Oral Q12H  . fluticasone  2 spray Each Nare BID  . fluticasone  1 puff Inhalation BID  . furosemide  40 mg Oral BID  . loratadine  10 mg Oral Daily  . magnesium gluconate  500 mg Oral BID  . methylPREDNISolone (SOLU-MEDROL) injection  1 mg/kg  Intravenous Q12H   Followed by  . [START ON 09/09/2020] predniSONE  50 mg Oral Daily  . montelukast  10 mg Oral QHS  . multivitamin-lutein  1 capsule Oral Daily  . oxybutynin  5 mg Oral QPM  . Oyster Shell Calcium/D  1 tablet Oral BID  . pantoprazole  40 mg Oral Daily  . pramipexole  0.5 mg Oral QID  . sodium chloride flush  3 mL Intravenous Q12H  . sucralfate  1 g Oral TID  . verapamil  180 mg Oral Daily  . vitamin B-12  1,000 mcg Oral QHS   Continuous Infusions: . sodium chloride    . remdesivir 100 mg in NS 100 mL Stopped (09/07/20 0920)     LOS: 2 days     Darlin Priestlyina Kadija Cruzen, MD [facility] If 7PM-7AM, please contact night-coverage 09/08/2020, 2:04 AM

## 2020-09-08 LAB — CBC
HCT: 34.2 % — ABNORMAL LOW (ref 36.0–46.0)
Hemoglobin: 11.4 g/dL — ABNORMAL LOW (ref 12.0–15.0)
MCH: 32.8 pg (ref 26.0–34.0)
MCHC: 33.3 g/dL (ref 30.0–36.0)
MCV: 98.3 fL (ref 80.0–100.0)
Platelets: 336 10*3/uL (ref 150–400)
RBC: 3.48 MIL/uL — ABNORMAL LOW (ref 3.87–5.11)
RDW: 14.1 % (ref 11.5–15.5)
WBC: 11.5 10*3/uL — ABNORMAL HIGH (ref 4.0–10.5)
nRBC: 0.2 % (ref 0.0–0.2)

## 2020-09-08 LAB — BASIC METABOLIC PANEL
Anion gap: 14 (ref 5–15)
BUN: 50 mg/dL — ABNORMAL HIGH (ref 8–23)
CO2: 32 mmol/L (ref 22–32)
Calcium: 9.9 mg/dL (ref 8.9–10.3)
Chloride: 97 mmol/L — ABNORMAL LOW (ref 98–111)
Creatinine, Ser: 1.22 mg/dL — ABNORMAL HIGH (ref 0.44–1.00)
GFR, Estimated: 45 mL/min — ABNORMAL LOW (ref >60)
Glucose, Bld: 158 mg/dL — ABNORMAL HIGH (ref 70–99)
Potassium: 3 mmol/L — ABNORMAL LOW (ref 3.5–5.1)
Sodium: 143 mmol/L (ref 135–145)

## 2020-09-08 LAB — MAGNESIUM: Magnesium: 1.8 mg/dL (ref 1.7–2.4)

## 2020-09-08 LAB — C-REACTIVE PROTEIN: CRP: 9.9 mg/dL — ABNORMAL HIGH (ref <1.0)

## 2020-09-08 LAB — PROCALCITONIN: Procalcitonin: <0.1 ng/mL

## 2020-09-08 MED ORDER — MAGIC MOUTHWASH
15.0000 mL | Freq: Three times a day (TID) | ORAL | Status: DC
  Administered 2020-09-08 – 2020-09-15 (×21): 15 mL via ORAL
  Filled 2020-09-08: qty 15
  Filled 2020-09-08: qty 20
  Filled 2020-09-08 (×3): qty 15
  Filled 2020-09-08: qty 20
  Filled 2020-09-08 (×5): qty 15
  Filled 2020-09-08 (×2): qty 20
  Filled 2020-09-08 (×5): qty 15
  Filled 2020-09-08: qty 20
  Filled 2020-09-08 (×2): qty 15
  Filled 2020-09-08: qty 20
  Filled 2020-09-08: qty 15
  Filled 2020-09-08: qty 20
  Filled 2020-09-08 (×7): qty 15

## 2020-09-08 MED ORDER — POTASSIUM CHLORIDE CRYS ER 20 MEQ PO TBCR
40.0000 meq | EXTENDED_RELEASE_TABLET | ORAL | Status: AC
  Administered 2020-09-08 (×2): 40 meq via ORAL
  Filled 2020-09-08: qty 2

## 2020-09-08 NOTE — Evaluation (Signed)
Physical Therapy Evaluation Patient Details Name: [name] MRN: [id number] DOB: 24-Nov-1940 Today's Date: 09/08/2020   History of Present Illness  80 y.o. female with medical history significant for atrial fibrillation on chronic anticoagulation therapy, history of CHF, hypertension who was brought into the ER by EMS for evaluation of shortness of breath and hypoxia.  Clinical Impression  Pt apparently has been having significant drop in O2 with even minimal activity, however today (on >70% HHHFNC) she was able to hold conversation, do 10 straight minutes of standing (with dispersed bouts of exercises) in walker with O2 staying in the 90s nearly the entire time and generally >94% most of the time.  Pt has recent R RTC tear and is limited due to this but did surprisingly well with activity tolerance and mobility in general.  She has a long way to go regarding weaning O2, but when this is stable she should be able to return home as she moves well and has a lot of different options to help her once home.       Follow Up Recommendations Home health PT    Equipment Recommendations  None recommended    Recommendations for Other Services       Precautions / Restrictions Precautions Precautions: Fall Restrictions Weight Bearing Restrictions: No      Mobility  Bed Mobility Overal bed mobility: Modified Independent             General bed mobility comments: difficult as she could not effectively use R UE, but able to get to sitting slowly w/o assist    Transfers Overall transfer level: Modified independent Equipment used: Rolling walker (2 wheeled)             General transfer comment: Pt was able to rise to standing w/o direct assist, cuing and close supervision  Ambulation/Gait Ambulation/Gait assistance: Min assist Gait Distance (Feet): 5 Feet Assistive device: Rolling walker (2 wheeled)       General Gait Details: Pt was able to take a few steps from bed to chair  with good safety and confidence  Stairs            Wheelchair Mobility    Modified Rankin (Stroke Patients Only)       Balance Overall balance assessment: Modified Independent                                           Pertinent Vitals/Pain Pain Assessment: No/denies pain    Home Living Family/patient expects to be discharged to:: Private residence Living Arrangements: Alone Available Help at Discharge: Family;Friend(s);Available 24 hours/day (2 aides, multiple neighbors and family members) Type of Home: House Home Access: Ramped entrance     Home Layout: One level Home Equipment: Walker - 4 wheels;Walker - 2 wheels;Shower seat;Bedside commode;Grab bars - toilet      Prior Function Level of Independence: Independent with assistive device(s);Independent (reports she does not use AD on "good days")               Hand Dominance   Dominant Hand: Right    Extremity/Trunk Assessment   Upper Extremity Assessment Upper Extremity Assessment: RUE deficits/detail (recent R RTC tear, had visit with surgeon scheduled for 4/19) RUE Deficits / Details: painful and limited R shoulder elevation, elbow and hand functional    Lower Extremity Assessment Lower Extremity Assessment: Generalized weakness (R hip grossly 3+/5,  otherwise 4/5 t/o)       Communication   Communication: No difficulties  Cognition Arousal/Alertness: Awake/alert Behavior During Therapy: WFL for tasks assessed/performed Overall Cognitive Status: Within Functional Limits for tasks assessed                                        General Comments General comments (skin integrity, edema, etc.): Pt on HHFNC t/o session, sats (surprisingly) stayed in the 90s almost the entire time while most of the time maintaining in the mid/upper 90s... even with activity and consistent conversation    Exercises Other Exercises Other Exercises: standing marching in place (2 sets  of 10 marches) in walker, hand held heel raises X 10   Assessment/Plan    PT Assessment Patient needs continued PT services  PT Problem List Decreased strength;Decreased range of motion;Decreased activity tolerance;Decreased balance;Decreased mobility;Decreased coordination;Decreased knowledge of use of DME;Decreased safety awareness;Cardiopulmonary status limiting activity       PT Treatment Interventions DME instruction;Gait training;Stair training;Functional mobility training;Therapeutic activities;Therapeutic exercise;Balance training;Cognitive remediation;Patient/family education    PT Goals (Current goals can be found in the Care Plan section)  Acute Rehab PT Goals Patient Stated Goal: Go home PT Goal Formulation: With patient Time For Goal Achievement: 09/22/20 Potential to Achieve Goals: Good    Frequency Min 2X/week   Barriers to discharge        Co-evaluation               AM-PAC PT "6 Clicks" Mobility  Outcome Measure Help needed turning from your back to your side while in a flat bed without using bedrails?: None Help needed moving from lying on your back to sitting on the side of a flat bed without using bedrails?: None Help needed moving to and from a bed to a chair (including a wheelchair)?: A Little Help needed standing up from a chair using your arms (e.g., wheelchair or bedside chair)?: A Little Help needed to walk in hospital room?: A Little Help needed climbing 3-5 steps with a railing? : A Lot 6 Click Score: 19    End of Session Equipment Utilized During Treatment: Gait belt Activity Tolerance: Patient tolerated treatment well Patient left: with chair alarm set;with call bell/phone within reach Nurse Communication: Mobility status PT Visit Diagnosis: Muscle weakness (generalized) (M62.81);Difficulty in walking, not elsewhere classified (R26.2)    Time: 1417-1500 PT Time Calculation (min) (ACUTE ONLY): 43 min   Charges:   PT Evaluation $PT  Eval Low Complexity: 1 Low PT Treatments $Therapeutic Activity: 8-22 mins        Malachi Pro, DPT 09/08/2020, 4:04 PM

## 2020-09-08 NOTE — Progress Notes (Addendum)
PROGRESS NOTE    [name]  WNU:[id number] DOB: 1940/12/14 DOA: 09/06/2020 PCP: Danella Penton, MD  [id number]  [name] is a 80 y.o. female with medical history significant for atrial fibrillation on chronic anticoagulation therapy, history of CHF, hypertension who was brought into the ER by EMS for evaluation of shortness of breath and hypoxia.  Assessment & Plan:   Principal Problem:   Pneumonia due to COVID-19 virus Active Problems:   Atrial fibrillation (HCC)   Acute on chronic diastolic CHF (congestive heart failure) (HCC)   Anxiety   GERD (gastroesophageal reflux disease)   Acute CHF (congestive heart failure) (HCC)   Acute respiratory failure due to COVID-19 (HCC)   Anemia of chronic disease   Frequent falls   Pneumonia due to COVID-19 virus  acute hypoxic respiratory failure Patient has received 3 doses of the COVID-19 vaccine  Chest x-ray shows multifocal pneumonia and her room air pulse oximetry was 45%. started on remdesivir per protocol as well as IV Solu-Medrol Currently on heated high flow at 45 L and 70%.  Procalcitonin remained negative. Inflammatory markers improving. Plan: --cont Remdesivir --cont steroid --Ensure CRP down trending --Continue supplemental O2 to keep sats >=90%, wean as tolerated --Supportive care with bronchodilator therapy and antitussives  chronic diastolic dysfunction CHF --does not appear to be acutely exacerbated.  BNP elevated however CXR neg for fluid overload --on Lasix 80 mg daily at home.  Was ordered as 40 mg BID on admission Plan: --cont oral Lasix 40 mg BID, since Cr is trending down  AKI, POA --Cr 1.96 on presentation.  Improved to 1.22 today. Plan: --cont oral Lasix 40 mg BID ordered on admission, since Cr is trending down  Hypokalemia.  Magnesium at 1.8. -Replete potassium and monitor  History of paroxysmal A. Fib --cont flecainide and verapamil --cont eliquis  HTN --BP low on presentation, then  started trending up, currently within goal. --cont verapamil and lasix  Restless leg --worsening symptom due to having to be in bed  --increase home pramipexole to 0.5 mg QID (up from 0.25)  History of depression and anxiety Continue trazodone, alprazolam and fluoxetine  Frequent falls Patient has had 2 falls within the last month and has an injury to the right shoulder related to a recent fall Place patient on fall precautions --PT  GERD Continue Protonix  Anemia of chronic disease H&H is stable   DVT prophylaxis: IH:[id number] Code Status: Full code  Family Communication:  Level of care: Progressive Cardiac Dispo:   The patient is from: home Anticipated d/c is to: home Anticipated d/c date is: > 3 days Patient currently is not medically ready to d/c due to: COVID on heated hf   Subjective and Interval History:  Patient continues to feel short of breath with minor exertion. Overnight desaturated in 40s after removing oxygen, oxygenation improved once nasal cannula was reinserted.  Objective: Vitals:   09/08/20 0550 09/08/20 0825 09/08/20 1144 09/08/20 1144  BP:  (!) 165/76 115/63 115/63  Pulse:  89 89 96  Resp:  15 15 16   Temp:  97.7 F (36.5 C) 98.3 F (36.8 C) 98.3 F (36.8 C)  TempSrc:  Oral Oral Oral  SpO2:  91% 93% 94%  Weight: 84.4 kg     Height:        Intake/Output Summary (Last 24 hours) at 09/08/2020 1501 Last data filed at 09/08/2020 1058 Gross per 24 hour  Intake 1021.34 ml  Output 2100 ml  Net -1078.66 ml  Filed Weights   09/06/20 0349 09/06/20 2304 09/08/20 0550  Weight: 84.8 kg 84 kg 84.4 kg    Examination:   General.  Obese elderly lady, in no acute distress. Pulmonary.  Lungs clear bilaterally, normal respiratory effort. CV.  Regular rate and rhythm, no JVD, rub or murmur. Abdomen.  Soft, nontender, nondistended, BS positive. CNS.  Alert and oriented x3.  No focal neurologic deficit. Extremities.  No edema, no cyanosis, pulses  intact and symmetrical. Psychiatry.  Judgment and insight appears normal.   Data Reviewed: I have personally reviewed following labs and imaging studies  CBC: Recent Labs  Lab 09/06/20 0408 09/07/20 0522 09/08/20 0626  WBC 7.2 8.8 11.5*  NEUTROABS 6.1 8.2*  --   HGB 10.2* 10.9* 11.4*  HCT 31.4* 32.6* 34.2*  MCV 100.0 97.3 98.3  PLT 254 309 336   Basic Metabolic Panel: Recent Labs  Lab 09/06/20 0408 09/07/20 0522 09/08/20 0626  NA 133* 138 143  K 3.7 3.1* 3.0*  CL 100 96* 97*  CO2 24 29 32  GLUCOSE 124* 158* 158*  BUN 68* 50* 50*  CREATININE 1.96* 1.31* 1.22*  CALCIUM 8.9 9.5 9.9  MG  --  1.9 1.8  PHOS  --  3.1  --    GFR: Estimated Creatinine Clearance: 41.6 mL/min (A) (by C-G formula based on SCr of 1.22 mg/dL (H)). Liver Function Tests: Recent Labs  Lab 09/06/20 0408 09/07/20 0522  AST 19 22  ALT 15 14  ALKPHOS 69 72  BILITOT 0.7 0.8  PROT 5.8* 6.6  ALBUMIN 2.7* 2.8*   No results for input(s): LIPASE, AMYLASE in the last 168 hours. No results for input(s): AMMONIA in the last 168 hours. Coagulation Profile: No results for input(s): INR, PROTIME in the last 168 hours. Cardiac Enzymes: No results for input(s): CKTOTAL, CKMB, CKMBINDEX, TROPONINI in the last 168 hours. BNP (last 3 results) No results for input(s): PROBNP in the last 8760 hours. HbA1C: No results for input(s): HGBA1C in the last 72 hours. CBG: No results for input(s): GLUCAP in the last 168 hours. Lipid Profile: No results for input(s): CHOL, HDL, LDLCALC, TRIG, CHOLHDL, LDLDIRECT in the last 72 hours. Thyroid Function Tests: No results for input(s): TSH, T4TOTAL, FREET4, T3FREE, THYROIDAB in the last 72 hours. Anemia Panel: Recent Labs    09/06/20 0549 09/07/20 0522  FERRITIN 72 85   Sepsis Labs: Recent Labs  Lab 09/06/20 0549 09/08/20 0626  PROCALCITON 0.10 <0.10    Recent Results (from the past 240 hour(s))  Resp Panel by RT-PCR (Flu A&B, Covid) Nasopharyngeal Swab      Status: Abnormal   Collection Time: 09/06/20  4:08 AM   Specimen: Nasopharyngeal Swab; Nasopharyngeal(NP) swabs in vial transport medium  Result Value Ref Range Status   SARS Coronavirus 2 by RT PCR POSITIVE (A) NEGATIVE Final    Comment: RESULT CALLED TO, READ BACK BY AND VERIFIED WITH: NOTIFIED JESSICA HUDSON RN 09/06/2020 0510 JG (NOTE) SARS-CoV-2 target nucleic acids are DETECTED.  The SARS-CoV-2 RNA is generally detectable in upper respiratory specimens during the acute phase of infection. Positive results are indicative of the presence of the identified virus, but do not rule out bacterial infection or co-infection with other pathogens not detected by the test. Clinical correlation with patient history and other diagnostic information is necessary to determine patient infection status. The expected result is Negative.  Fact Sheet for Patients: BloggerCourse.com  Fact Sheet for Healthcare Providers: SeriousBroker.it  This test is not yet approved or  cleared by the [place] and  has been authorized for detection and/or diagnosis of SARS-CoV-2 by FDA under an Emergency Use Authorization (EUA).  This EUA will remain in effect (meaning this  test can be used) for the duration of  the COVID-19 declaration under Section 564(b)(1) of the Act, 21 U.S.C. section 360bbb-3(b)(1), unless the authorization is terminated or revoked sooner.     Influenza A by PCR NEGATIVE NEGATIVE Final   Influenza B by PCR NEGATIVE NEGATIVE Final    Comment: (NOTE) The Xpert Xpress SARS-CoV-2/FLU/RSV plus assay is intended as an aid in the diagnosis of influenza from Nasopharyngeal swab specimens and should not be used as a sole basis for treatment. Nasal washings and aspirates are unacceptable for Xpert Xpress SARS-CoV-2/FLU/RSV testing.  Fact Sheet for Patients: BloggerCourse.com  Fact Sheet for Healthcare  Providers: SeriousBroker.it  This test is not yet approved or cleared by the [place] FDA and has been authorized for detection and/or diagnosis of SARS-CoV-2 by FDA under an Emergency Use Authorization (EUA). This EUA will remain in effect (meaning this test can be used) for the duration of the COVID-19 declaration under Section 564(b)(1) of the Act, 21 U.S.C. section 360bbb-3(b)(1), unless the authorization is terminated or revoked.  Performed at [facility], [address], [place], [place] [address]       Radiology Studies: No results found.   Scheduled Meds: . albuterol  2 puff Inhalation Q6H  . apixaban  5 mg Oral BID  . vitamin C  1,000 mg Oral Daily  . azelastine  1 spray Each Nare Daily  . carbamazepine  100 mg Oral BID  . estradiol  1.5 mg Oral Daily  . flecainide  50 mg Oral Q12H  . fluticasone  2 spray Each Nare BID  . fluticasone  1 puff Inhalation BID  . furosemide  40 mg Oral BID  . loratadine  10 mg Oral Daily  . magic mouthwash  15 mL Oral TID  . magnesium gluconate  500 mg Oral BID  . methylPREDNISolone (SOLU-MEDROL) injection  1 mg/kg Intravenous Q12H   Followed by  . [START ON 09/09/2020] predniSONE  50 mg Oral Daily  . montelukast  10 mg Oral QHS  . multivitamin-lutein  1 capsule Oral Daily  . oxybutynin  5 mg Oral QPM  . Oyster Shell Calcium/D  1 tablet Oral BID  . pantoprazole  40 mg Oral Daily  . pramipexole  0.5 mg Oral QID  . sodium chloride flush  3 mL Intravenous Q12H  . sucralfate  1 g Oral TID  . verapamil  180 mg Oral Daily  . vitamin B-12  1,000 mcg Oral QHS   Continuous Infusions: . sodium chloride    . remdesivir 100 mg in NS 100 mL 100 mg (09/08/20 0858)     LOS: 2 days     Arnetha Courser, MD [facility] If 7PM-7AM, please contact night-coverage 09/08/2020, 3:01 PM

## 2020-09-08 NOTE — Progress Notes (Signed)
Called to pt room.  Pt had removed her oxygen, c/o heart beating fast.  Oxygen at 39, HR 127.    Put pt back on oxygen.  Remained with patient until oxygen and heart rate were within parameters.

## 2020-09-09 LAB — BASIC METABOLIC PANEL
Anion gap: 13 (ref 5–15)
BUN: 66 mg/dL — ABNORMAL HIGH (ref 8–23)
CO2: 33 mmol/L — ABNORMAL HIGH (ref 22–32)
Calcium: 10 mg/dL (ref 8.9–10.3)
Chloride: 95 mmol/L — ABNORMAL LOW (ref 98–111)
Creatinine, Ser: 1.57 mg/dL — ABNORMAL HIGH (ref 0.44–1.00)
GFR, Estimated: 34 mL/min — ABNORMAL LOW (ref >60)
Glucose, Bld: 143 mg/dL — ABNORMAL HIGH (ref 70–99)
Potassium: 3.4 mmol/L — ABNORMAL LOW (ref 3.5–5.1)
Sodium: 141 mmol/L (ref 135–145)

## 2020-09-09 LAB — CBC
HCT: 34.5 % — ABNORMAL LOW (ref 36.0–46.0)
Hemoglobin: 11 g/dL — ABNORMAL LOW (ref 12.0–15.0)
MCH: 31.7 pg (ref 26.0–34.0)
MCHC: 31.9 g/dL (ref 30.0–36.0)
MCV: 99.4 fL (ref 80.0–100.0)
Platelets: 320 10*3/uL (ref 150–400)
RBC: 3.47 MIL/uL — ABNORMAL LOW (ref 3.87–5.11)
RDW: 14.2 % (ref 11.5–15.5)
WBC: 9.2 10*3/uL (ref 4.0–10.5)
nRBC: 0 % (ref 0.0–0.2)

## 2020-09-09 LAB — C-REACTIVE PROTEIN: CRP: 5.7 mg/dL — ABNORMAL HIGH (ref <1.0)

## 2020-09-09 LAB — MAGNESIUM: Magnesium: 1.8 mg/dL (ref 1.7–2.4)

## 2020-09-09 MED ORDER — TRAZODONE HCL 50 MG PO TABS
50.0000 mg | ORAL_TABLET | Freq: Every evening | ORAL | Status: DC | PRN
  Administered 2020-09-10 – 2020-09-14 (×5): 50 mg via ORAL
  Filled 2020-09-09 (×5): qty 1

## 2020-09-09 NOTE — Hospital Course (Addendum)
[name] is a 80 y.o. female with medical history significant for atrial fibrillation on chronic anticoagulation therapy, history of CHF, hypertension who was brought into the ER by EMS for evaluation of shortness of breath and hypoxia.  She was found to have pneumonia due to Covid-19, admitted to hospital for further management.  4/20-26: Continues to have significant oxygen requirements on heated HFNC at 45 L/min, although FiO2 has come down from 70% to 30-35%.    Patient is otherwise medically stable, aside from persistently high oxygen requirements.  She is appropriate for [facility] and medically stable for transfer there today.  Discussed with patient and her POA, and they are agreeable.

## 2020-09-09 NOTE — Progress Notes (Addendum)
PROGRESS NOTE    [name]   CVE:[id number]  DOB: 07/27/1940  PCP: Danella Penton, MD    DOA: 09/06/2020 LOS: 3   Brief Narrative   [name] is a 80 y.o. female with medical history significant for atrial fibrillation on chronic anticoagulation therapy, history of CHF, hypertension who was brought into the ER by EMS for evaluation of shortness of breath and hypoxia.  She was found to have pneumonia due to Covid-19, admitted to hospital for further management.  Has had significant oxygen requirements on heated HFNC at 45 L/min 70% FiO2.     Assessment & Plan   Principal Problem:   Pneumonia due to COVID-19 virus Active Problems:   Atrial fibrillation (HCC)   Anxiety   GERD (gastroesophageal reflux disease)   Acute on chronic diastolic CHF (congestive heart failure) (HCC)   Acute CHF (congestive heart failure) (HCC)   Acute respiratory failure due to COVID-19 (HCC)   Anemia of chronic disease   Frequent falls   Acute respiratory failure with hypoxia due to COVID-19 multifocal pneumonia -  Patient has received 3 doses of COVID-19 vaccine. Chest x-ray showed multifocal pneumonia and SPO2 on arrival was 45% on room air. She was started on remdesivir and IV Solu-Medrol. Continues to have significant requirement for supplemental oxygen, on heated HFNC at 45 L/min. -- Continue remdesivir --Continue steroid, has transition to prednisone 50 mg daily -- Supplemental oxygen as needed to maintain sat greater than 90%, wean as tolerated -- Supportive care: Bronchodilator, antitussives, incentive spirometer  Chronic diastolic CHF -appears euvolemic and well compensated at this time. Takes Lasix 80 mg daily at home, was ordered 40 mg twice daily on admission. --Hold oral Lasix given bump in creatinine 4/20 --Continue monitor renal function and electrolytes -- Monitor volume status: daily weights  Acute kidney injury -POA with creatinine 1.96.  Improved to CR 1.22, bumped today Cr  1.57. -- Hold Lasix -- Monitor BMP daily  Frequent falls -2 falls within the past month reported, sustained injury to her right shoulder rotator cuff. -- Fall precautions -- PT/OT  Hypokalemia -replaced and resolved. -- Monitor BMP, Mg and further replacement as indicated  History of paroxysmal A. Fib -rate controlled. -- Continue flecainide, verapamil, Eliquis -- Telemetry monitoring  Essential hypertension -BP was low on admission but has trended up and currently at goal. -- Continue verapamil, metoprolol -- Hold Lasix given bump in creatinine 4/20 -- Hold HCTZ  Restless leg syndrome -patient reports exacerbation due to being in the bed so much. -- Continue pramipexole (dose was increased from 0.25  >> 0.5mg  QID)  History of depression and anxiety -continue trazodone, alprazolam, fluoxetine  GERD -continue Protonix, Carafate  Anemia of chronic disease -hemoglobin stable.  No evidence of bleeding. --Monitor CBC  Allergic rhinitis -continue Flonase, Claritin, Singulair, azelastine nasal spray  History of muscle spasms -continue as needed Zanaflex  Continued on home meds: vitamin B12, Tegretol, vitamin C, multivitamins, oxybutynin, estradiol, colchicine   Patient BMI: Body mass index is 29 kg/m.   DVT prophylaxis:  apixaban (ELIQUIS) tablet 5 mg   Diet:  Diet Orders (From admission, onward)    Start     Ordered   09/06/20 0902  Diet 2 gram sodium Room service appropriate? Yes; Fluid consistency: Thin  Diet effective now       Question Answer Comment  Room service appropriate? Yes   Fluid consistency: Thin      09/06/20 0258  Code Status: Full Code    Subjective 09/09/20    Patient seen this morning, awake sitting up in bed.  Reports increased dry cough, unable to produce phlegm.  Denies fevers or chills.  Denies feeling short of breath.  Restless legs are bothering her more.  Requests only help her sleep as prednisone keeps her awake all night  when she has been on it previously.   Disposition Plan & Communication   Status is: Inpatient  Remains inpatient appropriate because:Inpatient level of care appropriate due to severity of illness -patient COVID-19 pneumonia continues to have significant requirements for supplemental oxygen, on heated high flow   Dispo: The patient is from: Home              Anticipated d/c is to: Home              Patient currently is not medically stable to d/c.   Difficult to place patient No   Consults, Procedures, Significant Events   Consultants:   None  Procedures:   None  Antimicrobials:  Anti-infectives (From admission, onward)   Start     Dose/Rate Route Frequency Ordered Stop   09/07/20 1000  remdesivir 100 mg in sodium chloride 0.9 % 100 mL IVPB       "Followed by" Linked Group Details   100 mg 200 mL/hr over 30 Minutes Intravenous Daily 09/06/20 0515 09/11/20 0959   09/07/20 1000  remdesivir 100 mg in sodium chloride 0.9 % 100 mL IVPB  Status:  Discontinued       "Followed by" Linked Group Details   100 mg 200 mL/hr over 30 Minutes Intravenous Daily 09/06/20 0903 09/06/20 0904   09/06/20 0915  remdesivir 200 mg in sodium chloride 0.9% 250 mL IVPB  Status:  Discontinued       "Followed by" Linked Group Details   200 mg 580 mL/hr over 30 Minutes Intravenous Once 09/06/20 0903 09/06/20 0904   09/06/20 0600  remdesivir 200 mg in sodium chloride 0.9% 250 mL IVPB       "Followed by" Linked Group Details   200 mg 580 mL/hr over 30 Minutes Intravenous Once 09/06/20 0515 09/06/20 0928        Micro    Objective   Vitals:   09/09/20 0600 09/09/20 0647 09/09/20 0920 09/09/20 1258  BP:  (!) 141/54 (!) 153/62 (!) 144/89  Pulse:  79 (!) 55 96  Resp:  18 19 18   Temp:  98.1 F (36.7 C) 97.7 F (36.5 C) 97.7 F (36.5 C)  TempSrc:  Oral Oral   SpO2:  90% 95% 90%  Weight: 81.5 kg     Height:        Intake/Output Summary (Last 24 hours) at 09/09/2020 1312 Last data filed at  09/09/2020 1303 Gross per 24 hour  Intake 1540.86 ml  Output 1100 ml  Net 440.86 ml   Filed Weights   09/06/20 2304 09/08/20 0550 09/09/20 0600  Weight: 84 kg 82 kg 81.5 kg    Physical Exam:  General exam: awake, alert, no acute distress HEENT: On heated high flow nasal cannula oxygen, moist mucus membranes, hearing grossly normal  Respiratory system: CTAB, no wheezes, rales or rhonchi, normal respiratory effort. Cardiovascular system: normal S1/S2, RRR, no pedal edema.   Gastrointestinal system: soft, NT, ND, +bowel sounds. Central nervous system: A&O x3. no gross focal neurologic deficits, normal speech Extremities: moves all, no edema, normal tone Psychiatry: normal mood, congruent affect, judgement and insight appear normal  Labs   Data Reviewed: I have personally reviewed following labs and imaging studies  CBC: Recent Labs  Lab 09/06/20 0408 09/07/20 0522 09/08/20 0626 09/09/20 0518  WBC 7.2 8.8 11.5* 9.2  NEUTROABS 6.1 8.2*  --   --   HGB 10.2* 10.9* 11.4* 11.0*  HCT 31.4* 32.6* 34.2* 34.5*  MCV 100.0 97.3 98.3 99.4  PLT 254 309 336 320   Basic Metabolic Panel: Recent Labs  Lab 09/06/20 0408 09/07/20 0522 09/08/20 0626 09/09/20 0518  NA 133* 138 143 141  K 3.7 3.1* 3.0* 3.4*  CL 100 96* 97* 95*  CO2 24 29 32 33*  GLUCOSE 124* 158* 158* 143*  BUN 68* 50* 50* 66*  CREATININE 1.96* 1.31* 1.22* 1.57*  CALCIUM 8.9 9.5 9.9 10.0  MG  --  1.9 1.8 1.8  PHOS  --  3.1  --   --    GFR: Estimated Creatinine Clearance: 31.8 mL/min (A) (by C-G formula based on SCr of 1.57 mg/dL (H)). Liver Function Tests: Recent Labs  Lab 09/06/20 0408 09/07/20 0522  AST 19 22  ALT 15 14  ALKPHOS 69 72  BILITOT 0.7 0.8  PROT 5.8* 6.6  ALBUMIN 2.7* 2.8*   No results for input(s): LIPASE, AMYLASE in the last 168 hours. No results for input(s): AMMONIA in the last 168 hours. Coagulation Profile: No results for input(s): INR, PROTIME in the last 168 hours. Cardiac  Enzymes: No results for input(s): CKTOTAL, CKMB, CKMBINDEX, TROPONINI in the last 168 hours. BNP (last 3 results) No results for input(s): PROBNP in the last 8760 hours. HbA1C: No results for input(s): HGBA1C in the last 72 hours. CBG: No results for input(s): GLUCAP in the last 168 hours. Lipid Profile: No results for input(s): CHOL, HDL, LDLCALC, TRIG, CHOLHDL, LDLDIRECT in the last 72 hours. Thyroid Function Tests: No results for input(s): TSH, T4TOTAL, FREET4, T3FREE, THYROIDAB in the last 72 hours. Anemia Panel: Recent Labs    09/07/20 0522  FERRITIN 85   Sepsis Labs: Recent Labs  Lab 09/06/20 0549 09/08/20 0626  PROCALCITON 0.10 <0.10    Recent Results (from the past 240 hour(s))  Resp Panel by RT-PCR (Flu A&B, Covid) Nasopharyngeal Swab     Status: Abnormal   Collection Time: 09/06/20  4:08 AM   Specimen: Nasopharyngeal Swab; Nasopharyngeal(NP) swabs in vial transport medium  Result Value Ref Range Status   SARS Coronavirus 2 by RT PCR POSITIVE (A) NEGATIVE Final    Comment: RESULT CALLED TO, READ BACK BY AND VERIFIED WITH: NOTIFIED JESSICA HUDSON RN 09/06/2020 0510 JG (NOTE) SARS-CoV-2 target nucleic acids are DETECTED.  The SARS-CoV-2 RNA is generally detectable in upper respiratory specimens during the acute phase of infection. Positive results are indicative of the presence of the identified virus, but do not rule out bacterial infection or co-infection with other pathogens not detected by the test. Clinical correlation with patient history and other diagnostic information is necessary to determine patient infection status. The expected result is Negative.  Fact Sheet for Patients: BloggerCourse.com  Fact Sheet for Healthcare Providers: SeriousBroker.it  This test is not yet approved or cleared by the [place] FDA and  has been authorized for detection and/or diagnosis of SARS-CoV-2 by FDA under an  Emergency Use Authorization (EUA).  This EUA will remain in effect (meaning this  test can be used) for the duration of  the COVID-19 declaration under Section 564(b)(1) of the Act, 21 U.S.C. section 360bbb-3(b)(1), unless the authorization is terminated or revoked sooner.  Influenza A by PCR NEGATIVE NEGATIVE Final   Influenza B by PCR NEGATIVE NEGATIVE Final    Comment: (NOTE) The Xpert Xpress SARS-CoV-2/FLU/RSV plus assay is intended as an aid in the diagnosis of influenza from Nasopharyngeal swab specimens and should not be used as a sole basis for treatment. Nasal washings and aspirates are unacceptable for Xpert Xpress SARS-CoV-2/FLU/RSV testing.  Fact Sheet for Patients: BloggerCourse.com  Fact Sheet for Healthcare Providers: SeriousBroker.it  This test is not yet approved or cleared by the [place] FDA and has been authorized for detection and/or diagnosis of SARS-CoV-2 by FDA under an Emergency Use Authorization (EUA). This EUA will remain in effect (meaning this test can be used) for the duration of the COVID-19 declaration under Section 564(b)(1) of the Act, 21 U.S.C. section 360bbb-3(b)(1), unless the authorization is terminated or revoked.  Performed at [facility], [address] [place], [place] [address]       Imaging Studies   No results found.   Medications   Scheduled Meds: . albuterol  2 puff Inhalation Q6H  . apixaban  5 mg Oral BID  . vitamin C  1,000 mg Oral Daily  . azelastine  1 spray Each Nare Daily  . calcium-vitamin D  1 tablet Oral BID  . carbamazepine  100 mg Oral BID  . estradiol  1.5 mg Oral Daily  . flecainide  50 mg Oral Q12H  . fluticasone  2 spray Each Nare BID  . fluticasone  1 puff Inhalation BID  . furosemide  40 mg Oral BID  . loratadine  10 mg Oral Daily  . magic mouthwash  15 mL Oral TID  . magnesium gluconate  500 mg Oral BID  . montelukast  10 mg  Oral QHS  . multivitamin-lutein  1 capsule Oral Daily  . oxybutynin  5 mg Oral QPM  . pantoprazole  40 mg Oral Daily  . pramipexole  0.5 mg Oral QID  . predniSONE  50 mg Oral Daily  . sodium chloride flush  3 mL Intravenous Q12H  . sucralfate  1 g Oral TID  . verapamil  180 mg Oral Daily  . vitamin B-12  1,000 mcg Oral QHS   Continuous Infusions: . sodium chloride    . remdesivir 100 mg in NS 100 mL 100 mg (09/09/20 1006)       LOS: 3 days    Time spent: 30 minutes    Pennie Banter, DO [facility]  09/09/2020, 1:12 PM      If 7PM-7AM, please contact night-coverage. How to contact the [facility] Attending or Consulting provider 7A - 7P or covering provider during after hours 7P -7A, for this patient?    1. Check the care team in [facility] and look for a) attending/consulting [facility] provider listed and b) the [facility] team listed 2. Log into [email or web address] and use [facility]'s universal password to access. If you do not have the password, please contact the hospital operator. 3. Locate the [facility] provider you are looking for under [facility] and page to a number that you can be directly reached. 4. If you still have difficulty reaching the provider, please page the [facility] (Director on Call) for the Hospitalists listed on amion for assistance.

## 2020-09-10 LAB — COMPREHENSIVE METABOLIC PANEL
ALT: 20 U/L (ref 0–44)
AST: 21 U/L (ref 15–41)
Albumin: 2.7 g/dL — ABNORMAL LOW (ref 3.5–5.0)
Alkaline Phosphatase: 59 U/L (ref 38–126)
Anion gap: 12 (ref 5–15)
BUN: 66 mg/dL — ABNORMAL HIGH (ref 8–23)
CO2: 32 mmol/L (ref 22–32)
Calcium: 9.5 mg/dL (ref 8.9–10.3)
Chloride: 94 mmol/L — ABNORMAL LOW (ref 98–111)
Creatinine, Ser: 1.35 mg/dL — ABNORMAL HIGH (ref 0.44–1.00)
GFR, Estimated: 40 mL/min — ABNORMAL LOW (ref >60)
Glucose, Bld: 103 mg/dL — ABNORMAL HIGH (ref 70–99)
Potassium: 2.7 mmol/L — CL (ref 3.5–5.1)
Sodium: 138 mmol/L (ref 135–145)
Total Bilirubin: 0.9 mg/dL (ref 0.3–1.2)
Total Protein: 5.9 g/dL — ABNORMAL LOW (ref 6.5–8.1)

## 2020-09-10 LAB — CBC
HCT: 33.4 % — ABNORMAL LOW (ref 36.0–46.0)
Hemoglobin: 11 g/dL — ABNORMAL LOW (ref 12.0–15.0)
MCH: 32.3 pg (ref 26.0–34.0)
MCHC: 32.9 g/dL (ref 30.0–36.0)
MCV: 97.9 fL (ref 80.0–100.0)
Platelets: 303 10*3/uL (ref 150–400)
RBC: 3.41 MIL/uL — ABNORMAL LOW (ref 3.87–5.11)
RDW: 14 % (ref 11.5–15.5)
WBC: 10.4 10*3/uL (ref 4.0–10.5)
nRBC: 0 % (ref 0.0–0.2)

## 2020-09-10 LAB — MAGNESIUM: Magnesium: 1.6 mg/dL — ABNORMAL LOW (ref 1.7–2.4)

## 2020-09-10 LAB — C-REACTIVE PROTEIN: CRP: 3.2 mg/dL — ABNORMAL HIGH (ref <1.0)

## 2020-09-10 MED ORDER — POTASSIUM CHLORIDE CRYS ER 20 MEQ PO TBCR
40.0000 meq | EXTENDED_RELEASE_TABLET | ORAL | Status: AC
  Administered 2020-09-10 (×2): 40 meq via ORAL
  Filled 2020-09-10 (×2): qty 2

## 2020-09-10 MED ORDER — POTASSIUM CHLORIDE CRYS ER 20 MEQ PO TBCR
20.0000 meq | EXTENDED_RELEASE_TABLET | Freq: Once | ORAL | Status: AC
  Administered 2020-09-10: 20 meq via ORAL
  Filled 2020-09-10: qty 1

## 2020-09-10 MED ORDER — SALINE SPRAY 0.65 % NA SOLN
1.0000 | NASAL | Status: DC | PRN
  Administered 2020-09-10 – 2020-09-14 (×6): 1 via NASAL
  Filled 2020-09-10: qty 44

## 2020-09-10 MED ORDER — ACYCLOVIR 5 % EX OINT
TOPICAL_OINTMENT | CUTANEOUS | Status: DC
  Administered 2020-09-10 – 2020-09-14 (×6): 1 via TOPICAL
  Filled 2020-09-10: qty 15

## 2020-09-10 MED ORDER — MAGNESIUM SULFATE 2 GM/50ML IV SOLN
2.0000 g | Freq: Once | INTRAVENOUS | Status: AC
  Administered 2020-09-10: 2 g via INTRAVENOUS
  Filled 2020-09-10: qty 50

## 2020-09-10 NOTE — Progress Notes (Signed)
PROGRESS NOTE    TANIA STEINHAUSER   QQP:[id number]  DOB: 1940/12/24  PCP: Danella Penton, MD    DOA: 09/06/2020 LOS: 4   Brief Narrative   [name] is a 80 y.o. female with medical history significant for atrial fibrillation on chronic anticoagulation therapy, history of CHF, hypertension who was brought into the ER by EMS for evaluation of shortness of breath and hypoxia.  She was found to have pneumonia due to Covid-19, admitted to hospital for further management.  Has had significant oxygen requirements on heated HFNC at 45 L/min 70% FiO2.     Assessment & Plan   Principal Problem:   Pneumonia due to COVID-19 virus Active Problems:   Atrial fibrillation (HCC)   Anxiety   GERD (gastroesophageal reflux disease)   Acute on chronic diastolic CHF (congestive heart failure) (HCC)   Acute CHF (congestive heart failure) (HCC)   Acute respiratory failure due to COVID-19 (HCC)   Anemia of chronic disease   Frequent falls   Acute respiratory failure with hypoxia due to COVID-19 multifocal pneumonia -  Patient has received 3 doses of COVID-19 vaccine. Chest x-ray showed multifocal pneumonia and SPO2 on arrival was 45% on room air. She was started on remdesivir and IV Solu-Medrol. Continues to have significant requirement for supplemental oxygen, on heated HFNC at 45 L/min. -- Continue remdesivir - last day today 4/21 -- Continue prednisone 50 mg daily, initially on IV Solu-medrol -- Supplemental oxygen as needed to maintain sat greater than 90%, wean as tolerated -- Supportive care: Bronchodilators, antitussives, incentive spirometer  Chronic diastolic CHF -appears euvolemic and well compensated at this time. Takes Lasix 80 mg daily at home, was ordered 40 mg twice daily on admission. --Hold oral Lasix given bump in creatinine 4/20 --Continue monitor renal function and electrolytes -- Monitor volume status: daily weights  Acute kidney injury -POA with creatinine 1.96.  Improved  to CR 1.22, bumped 4/20 to Cr 1.57. -- Hold Lasix, may resume tomorrow if renal function improved. -- Monitor BMP daily  Frequent falls -2 falls within the past month reported, sustained injury to her right shoulder rotator cuff. -- Fall precautions -- PT/OT  Hypokalemia -replaced and resolved. -- Monitor BMP, Mg and further replacement as indicated  History of paroxysmal A. Fib -rate controlled. -- Continue flecainide, verapamil, Eliquis -- Telemetry monitoring -- EKG pending, monitor QTc  Essential hypertension -BP was low on admission but has trended up and currently at goal. -- Continue verapamil, metoprolol -- Hold Lasix given bump in creatinine 4/20 -- Hold HCTZ  Hx of recurrent sinusitis - pt does have some congestion, suspect due to heated HF oxygen. 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 --topical acyclovir ordered  Restless leg syndrome -patient reports exacerbation due to being in the bed so much. -- Continue pramipexole (dose was increased from 0.25  >> 0.5mg  QID)  History of depression and anxiety -continue trazodone, alprazolam, fluoxetine  GERD -continue Protonix, Carafate  Anemia of chronic disease -hemoglobin stable.  No evidence of bleeding. --Monitor CBC  Allergic rhinitis -continue Flonase, Claritin, Singulair, azelastine nasal spray  History of muscle spasms -continue as needed Zanaflex  Continued on home meds: vitamin B12, Tegretol, vitamin C, multivitamins, oxybutynin, estradiol, colchicine   Patient BMI: Body mass index is 29 kg/m.   DVT prophylaxis:  apixaban (ELIQUIS) tablet 5 mg  Diet:  Diet Orders (From admission, onward)    Start     Ordered   09/06/20 0902  Diet 2 gram sodium Room service appropriate? Yes; Fluid consistency: Thin  Diet effective now       Question Answer Comment   Room service appropriate? Yes   Fluid consistency: Thin      09/06/20 0903            Code Status: Full Code    Subjective 09/10/20    Patient seen this morning, awake sitting up in bed.  Reports onset of a fever blister, says she gets these once in awhile and uses a topical medication at home, does not recall the name.  Feels short of breath when up moving around, but not at rest. No fever, chills, chest pain or other acute complaints.   Disposition Plan & Communication   Status is: Inpatient  Remains inpatient appropriate because:Inpatient level of care appropriate due to severity of illness -patient COVID-19 pneumonia continues to have significant requirements for supplemental oxygen, on heated high flow.   Dispo: The patient is from: Home              Anticipated d/c is to: Home              Patient currently is not medically stable to d/c.   Difficult to place patient No   Consults, Procedures, Significant Events   Consultants:   None  Procedures:   None  Antimicrobials:  Anti-infectives (From admission, onward)   Start     Dose/Rate Route Frequency Ordered Stop   09/07/20 1000  remdesivir 100 mg in sodium chloride 0.9 % 100 mL IVPB       "Followed by" Linked Group Details   100 mg 200 mL/hr over 30 Minutes Intravenous Daily 09/06/20 0515 09/10/20 0945   09/07/20 1000  remdesivir 100 mg in sodium chloride 0.9 % 100 mL IVPB  Status:  Discontinued       "Followed by" Linked Group Details   100 mg 200 mL/hr over 30 Minutes Intravenous Daily 09/06/20 0903 09/06/20 0904   09/06/20 0915  remdesivir 200 mg in sodium chloride 0.9% 250 mL IVPB  Status:  Discontinued       "Followed by" Linked Group Details   200 mg 580 mL/hr over 30 Minutes Intravenous Once 09/06/20 0903 09/06/20 0904   09/06/20 0600  remdesivir 200 mg in sodium chloride 0.9% 250 mL IVPB       "Followed by" Linked Group Details   200 mg 580 mL/hr over 30 Minutes Intravenous Once 09/06/20 0515  09/06/20 0928        Micro    Objective   Vitals:   09/09/20 2054 09/10/20 0454 09/10/20 0857 09/10/20 1241  BP: (!) 144/74  106/67 129/76  Pulse: 84  78 100  Resp: (!) 22  (!) 22   Temp: 98 F (36.7 C)  97.6 F (36.4 C) 97.7 F (36.5 C)  TempSrc: Oral  Oral Oral  SpO2: (!) 88% 91% 97% 98%  Weight:      Height:        Intake/Output Summary (Last 24 hours) at 09/10/2020 1404 Last data filed at 09/10/2020 1038 Gross per 24 hour  Intake 720 ml  Output 1800 ml  Net -1080 ml   Filed Weights   09/06/20 2304 09/08/20 0550 09/09/20 0600  Weight: 84 kg 82 kg 81.5 kg    Physical Exam:  General exam: awake, alert, no acute distress  HEENT: On heated HFNC oxygen, right lower lip early cold sore without any drainage seen, moist mucus membranes, hearing grossly normal  Respiratory system: overall diminished, no wheezes, rales or rhonchi, normal respiratory effort. Cardiovascular system: normal S1/S2, RRR, no pedal edema.   Central nervous system: A&O x3. no gross focal neurologic deficits, normal speech Extremities: moves all, no edema, normal tone  Labs   Data Reviewed: I have personally reviewed following labs and imaging studies  CBC: Recent Labs  Lab 09/06/20 0408 09/07/20 0522 09/08/20 0626 09/09/20 0518 09/10/20 0532  WBC 7.2 8.8 11.5* 9.2 10.4  NEUTROABS 6.1 8.2*  --   --   --   HGB 10.2* 10.9* 11.4* 11.0* 11.0*  HCT 31.4* 32.6* 34.2* 34.5* 33.4*  MCV 100.0 97.3 98.3 99.4 97.9  PLT 254 309 336 320 303   Basic Metabolic Panel: Recent Labs  Lab 09/06/20 0408 09/07/20 0522 09/08/20 0626 09/09/20 0518 09/10/20 0532  NA 133* 138 143 141 138  K 3.7 3.1* 3.0* 3.4* 2.7*  CL 100 96* 97* 95* 94*  CO2 24 29 32 33* 32  GLUCOSE 124* 158* 158* 143* 103*  BUN 68* 50* 50* 66* 66*  CREATININE 1.96* 1.31* 1.22* 1.57* 1.35*  CALCIUM 8.9 9.5 9.9 10.0 9.5  MG  --  1.9 1.8 1.8 1.6*  PHOS  --  3.1  --   --   --    GFR: Estimated Creatinine Clearance: 37 mL/min (A)  (by C-G formula based on SCr of 1.35 mg/dL (H)). Liver Function Tests: Recent Labs  Lab 09/06/20 0408 09/07/20 0522 09/10/20 0532  AST 19 22 21   ALT 15 14 20   ALKPHOS 69 72 59  BILITOT 0.7 0.8 0.9  PROT 5.8* 6.6 5.9*  ALBUMIN 2.7* 2.8* 2.7*   No results for input(s): LIPASE, AMYLASE in the last 168 hours. No results for input(s): AMMONIA in the last 168 hours. Coagulation Profile: No results for input(s): INR, PROTIME in the last 168 hours. Cardiac Enzymes: No results for input(s): CKTOTAL, CKMB, CKMBINDEX, TROPONINI in the last 168 hours. BNP (last 3 results) No results for input(s): PROBNP in the last 8760 hours. HbA1C: No results for input(s): HGBA1C in the last 72 hours. CBG: No results for input(s): GLUCAP in the last 168 hours. Lipid Profile: No results for input(s): CHOL, HDL, LDLCALC, TRIG, CHOLHDL, LDLDIRECT in the last 72 hours. Thyroid Function Tests: No results for input(s): TSH, T4TOTAL, FREET4, T3FREE, THYROIDAB in the last 72 hours. Anemia Panel: No results for input(s): VITAMINB12, FOLATE, FERRITIN, TIBC, IRON, RETICCTPCT in the last 72 hours. Sepsis Labs: Recent Labs  Lab 09/06/20 0549 09/08/20 0626  PROCALCITON 0.10 <0.10    Recent Results (from the past 240 hour(s))  Resp Panel by RT-PCR (Flu A&B, Covid) Nasopharyngeal Swab     Status: Abnormal   Collection Time: 09/06/20  4:08 AM   Specimen: Nasopharyngeal Swab; Nasopharyngeal(NP) swabs in vial transport medium  Result Value Ref Range Status   SARS Coronavirus 2 by RT PCR POSITIVE (A) NEGATIVE Final    Comment: RESULT CALLED TO, READ BACK BY AND VERIFIED WITH: NOTIFIED JESSICA HUDSON RN 09/06/2020 0510 JG (NOTE) SARS-CoV-2 target nucleic acids are DETECTED.  The SARS-CoV-2 RNA is generally detectable in upper respiratory specimens during the acute phase of infection. Positive results are indicative of the presence of the identified virus, but do not rule out bacterial infection or co-infection  with other pathogens not detected by the test. Clinical correlation with patient history and other diagnostic  information is necessary to determine patient infection status. The expected result is Negative.  Fact Sheet for Patients: BloggerCourse.comhttps://www.fda.gov/media/152166/download  Fact Sheet for Healthcare Providers: SeriousBroker.ithttps://www.fda.gov/media/152162/download  This test is not yet approved or cleared by the [place] FDA and  has been authorized for detection and/or diagnosis of SARS-CoV-2 by FDA under an Emergency Use Authorization (EUA).  This EUA will remain in effect (meaning this  test can be used) for the duration of  the COVID-19 declaration under Section 564(b)(1) of the Act, 21 U.S.C. section 360bbb-3(b)(1), unless the authorization is terminated or revoked sooner.     Influenza A by PCR NEGATIVE NEGATIVE Final   Influenza B by PCR NEGATIVE NEGATIVE Final    Comment: (NOTE) The Xpert Xpress SARS-CoV-2/FLU/RSV plus assay is intended as an aid in the diagnosis of influenza from Nasopharyngeal swab specimens and should not be used as a sole basis for treatment. Nasal washings and aspirates are unacceptable for Xpert Xpress SARS-CoV-2/FLU/RSV testing.  Fact Sheet for Patients: BloggerCourse.comhttps://www.fda.gov/media/152166/download  Fact Sheet for Healthcare Providers: SeriousBroker.ithttps://www.fda.gov/media/152162/download  This test is not yet approved or cleared by the [place] FDA and has been authorized for detection and/or diagnosis of SARS-CoV-2 by FDA under an Emergency Use Authorization (EUA). This EUA will remain in effect (meaning this test can be used) for the duration of the COVID-19 declaration under Section 564(b)(1) of the Act, 21 U.S.C. section 360bbb-3(b)(1), unless the authorization is terminated or revoked.  Performed at [facility], [address] [place], [place] [address]       Imaging Studies   No results found.   Medications   Scheduled Meds: .  acyclovir ointment   Topical Q3H  . albuterol  2 puff Inhalation Q6H  . apixaban  5 mg Oral BID  . vitamin C  1,000 mg Oral Daily  . azelastine  1 spray Each Nare Daily  . calcium-vitamin D  1 tablet Oral BID  . carbamazepine  100 mg Oral BID  . estradiol  1.5 mg Oral Daily  . flecainide  50 mg Oral Q12H  . fluticasone  2 spray Each Nare BID  . fluticasone  1 puff Inhalation BID  . furosemide  40 mg Oral BID  . loratadine  10 mg Oral Daily  . magic mouthwash  15 mL Oral TID  . magnesium gluconate  500 mg Oral BID  . montelukast  10 mg Oral QHS  . multivitamin-lutein  1 capsule Oral Daily  . oxybutynin  5 mg Oral QPM  . pantoprazole  40 mg Oral Daily  . potassium chloride  20 mEq Oral Once  . pramipexole  0.5 mg Oral QID  . predniSONE  50 mg Oral Daily  . sodium chloride flush  3 mL Intravenous Q12H  . sucralfate  1 g Oral TID  . verapamil  180 mg Oral Daily  . vitamin B-12  1,000 mcg Oral QHS   Continuous Infusions: . sodium chloride 250 mL (09/10/20 0914)       LOS: 4 days    Time spent: 30 minutes with > 50% spent at bedside and in coordination of care.    Pennie BanterKelly A Chandni Gagan, DO [facility]  09/10/2020, 2:04 PM      If 7PM-7AM, please contact night-coverage. How to contact the [facility] Attending or Consulting provider 7A - 7P or covering provider during after hours 7P -7A, for this patient?    1. Check the care team in [facility] and look for a) attending/consulting [facility] provider listed  and b) the [facility] team listed 2. Log into [email or web address] and use [facility] universal password to access. If you do not have the password, please contact the hospital operator. 3. Locate the [facility] provider you are looking for under [facility] and [place] to a number that you can be directly reached. 4. If you still have difficulty reaching the provider, please [place] the DOC (Director on Call) for the Hospitalists listed on amion for assistance.  

## 2020-09-10 NOTE — Progress Notes (Signed)
PT Cancellation Note  Patient Details Name: [name] MRN: [id number] DOB: 05-Oct-1940   Cancelled Treatment:    Reason Eval/Treat Not Completed: Medical issues which prohibited therapy: Pt's Ka 2.7 and trending down falling outside guidelines for participation with PT services.  Will attempt to see pt at a future date/time as medically appropriate.     Ovidio Hanger PT, DPT 09/10/20, 4:45 PM

## 2020-09-11 LAB — COMPREHENSIVE METABOLIC PANEL
ALT: 18 U/L (ref 0–44)
AST: 20 U/L (ref 15–41)
Albumin: 2.7 g/dL — ABNORMAL LOW (ref 3.5–5.0)
Alkaline Phosphatase: 65 U/L (ref 38–126)
Anion gap: 12 (ref 5–15)
BUN: 63 mg/dL — ABNORMAL HIGH (ref 8–23)
CO2: 30 mmol/L (ref 22–32)
Calcium: 9.3 mg/dL (ref 8.9–10.3)
Chloride: 98 mmol/L (ref 98–111)
Creatinine, Ser: 1.32 mg/dL — ABNORMAL HIGH (ref 0.44–1.00)
GFR, Estimated: 41 mL/min — ABNORMAL LOW (ref >60)
Glucose, Bld: 149 mg/dL — ABNORMAL HIGH (ref 70–99)
Potassium: 3.6 mmol/L (ref 3.5–5.1)
Sodium: 140 mmol/L (ref 135–145)
Total Bilirubin: 1 mg/dL (ref 0.3–1.2)
Total Protein: 5.7 g/dL — ABNORMAL LOW (ref 6.5–8.1)

## 2020-09-11 LAB — C-REACTIVE PROTEIN: CRP: 6 mg/dL — ABNORMAL HIGH (ref <1.0)

## 2020-09-11 LAB — CBC
HCT: 32.8 % — ABNORMAL LOW (ref 36.0–46.0)
Hemoglobin: 10.5 g/dL — ABNORMAL LOW (ref 12.0–15.0)
MCH: 31.8 pg (ref 26.0–34.0)
MCHC: 32 g/dL (ref 30.0–36.0)
MCV: 99.4 fL (ref 80.0–100.0)
Platelets: 318 10*3/uL (ref 150–400)
RBC: 3.3 MIL/uL — ABNORMAL LOW (ref 3.87–5.11)
RDW: 14 % (ref 11.5–15.5)
WBC: 10.5 10*3/uL (ref 4.0–10.5)
nRBC: 0 % (ref 0.0–0.2)

## 2020-09-11 LAB — MAGNESIUM: Magnesium: 1.9 mg/dL (ref 1.7–2.4)

## 2020-09-11 MED ORDER — POTASSIUM CHLORIDE CRYS ER 20 MEQ PO TBCR
40.0000 meq | EXTENDED_RELEASE_TABLET | Freq: Two times a day (BID) | ORAL | Status: AC
  Administered 2020-09-11 (×2): 40 meq via ORAL
  Filled 2020-09-11 (×2): qty 2

## 2020-09-11 MED ORDER — DOXYCYCLINE HYCLATE 100 MG PO TABS
100.0000 mg | ORAL_TABLET | Freq: Two times a day (BID) | ORAL | Status: DC
  Administered 2020-09-11 – 2020-09-15 (×8): 100 mg via ORAL
  Filled 2020-09-11 (×8): qty 1

## 2020-09-11 NOTE — Progress Notes (Signed)
PROGRESS NOTE    Ann Fellersnn B Dearman   ZOX:[id number]  DOB: [birth year]  PCP: Danella PentonMiller, Mark F, MD    DOA: 09/06/2020 LOS: 5   Brief Narrative   [name] is a 80 y.o. female with medical history significant for atrial fibrillation on chronic anticoagulation therapy, history of CHF, hypertension who was brought into the ER by EMS for evaluation of shortness of breath and hypoxia.  She was found to have pneumonia due to Covid-19, admitted to hospital for further management.  Has had significant oxygen requirements on heated HFNC at 45 L/min 70% FiO2.     Assessment & Plan   Principal Problem:   Pneumonia due to COVID-19 virus Active Problems:   Atrial fibrillation (HCC)   Anxiety   GERD (gastroesophageal reflux disease)   Acute on chronic diastolic CHF (congestive heart failure) (HCC)   Acute CHF (congestive heart failure) (HCC)   Acute respiratory failure due to COVID-19 (HCC)   Anemia of chronic disease   Frequent falls   Acute respiratory failure with hypoxia due to COVID-19 multifocal pneumonia -  Patient has received 3 doses of COVID-19 vaccine. Chest x-ray showed multifocal pneumonia and SPO2 on arrival was 45% on room air. She was started on remdesivir and IV Solu-Medrol. Continues to have significant requirement for supplemental oxygen, on heated HFNC at 45 L/min. -- Continue remdesivir - last day today 4/21 -- Continue prednisone 50 mg daily, initially on IV Solu-medrol -- Supplemental oxygen as needed to maintain sat greater than 90%, wean as tolerated -- Supportive care: Bronchodilators, antitussives, incentive spirometer  Acute sinusitis - with hx of recurrent sinus infections, facial pain/pressure and tenderness, significant thick post-nasal drainage. --Start doxycycline (pcn allergic) --Continue azelastine spray, Flonase, loratadine  Chronic diastolic CHF -appears euvolemic and well compensated at this time. Takes Lasix 80 mg daily at home, was ordered 40 mg twice  daily on admission. --Hold oral Lasix given bump in creatinine 4/20 --Continue monitor renal function and electrolytes -- Monitor volume status: daily weights  Acute kidney injury -POA with creatinine 1.96.  Improved to CR 1.22, bumped 4/20 to Cr 1.57. -- Hold Lasix, may resume tomorrow if renal function improved. -- Monitor BMP daily  Frequent falls -2 falls within the past month reported, sustained injury to her right shoulder rotator cuff. -- Fall precautions -- PT/OT  Hypokalemia -replaced and resolved. -- Monitor BMP, Mg and further replacement as indicated  History of paroxysmal A. Fib -rate controlled. -- Continue flecainide, verapamil, Eliquis -- Telemetry monitoring -- EKG pending, monitor QTc  Essential hypertension -BP was low on admission but has trended up and currently at goal. -- Continue verapamil, metoprolol -- Hold Lasix given bump in creatinine 4/20 -- Hold HCTZ  Hx of recurrent sinusitis - pt does have some congestion, suspect due to heated HF oxygen. 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 --topical acyclovir ordered  Restless leg syndrome -patient reports exacerbation due to being in the bed so much. -- Continue pramipexole (dose was increased from 0.25  >> 0.5mg  QID)  History of depression and anxiety -continue trazodone, alprazolam, fluoxetine  GERD -continue Protonix, Carafate  Anemia of chronic disease -hemoglobin stable.  No evidence of bleeding. --Monitor CBC  Allergic rhinitis -continue Flonase, Claritin, Singulair, azelastine nasal spray  History of muscle spasms -continue as needed Zanaflex  Continued on home meds: vitamin B12,  Tegretol, vitamin C, multivitamins, oxybutynin, estradiol, colchicine   Patient BMI: Body mass index is 29 kg/m.   DVT prophylaxis:  apixaban (ELIQUIS)  tablet 5 mg   Diet:  Diet Orders (From admission, onward)    Start     Ordered   09/06/20 0902  Diet 2 gram sodium Room service appropriate? Yes; Fluid consistency: Thin  Diet effective now       Question Answer Comment  Room service appropriate? Yes   Fluid consistency: Thin      09/06/20 0903            Code Status: Full Code    Subjective 09/11/20    Patient seen this morning, awake sitting up in bed.  Fever blister improving with topical med.  Having a lot of sinus / facial pain and pressure.  Has frequent sinus infections.  Cough has been dry and hacking.  No fevers or chills.    Disposition Plan & Communication   Status is: Inpatient  Remains inpatient appropriate because:Inpatient level of care appropriate due to severity of illness -patient COVID-19 pneumonia continues to have significant requirements for supplemental oxygen, on heated high flow.   Dispo: The patient is from: Home              Anticipated d/c is to: Home              Patient currently is not medically stable to d/c.   Difficult to place patient No   Consults, Procedures, Significant Events   Consultants:   None  Procedures:   None  Antimicrobials:  Anti-infectives (From admission, onward)   Start     Dose/Rate Route Frequency Ordered Stop   09/07/20 1000  remdesivir 100 mg in sodium chloride 0.9 % 100 mL IVPB       "Followed by" Linked Group Details   100 mg 200 mL/hr over 30 Minutes Intravenous Daily 09/06/20 0515 09/10/20 0945   09/07/20 1000  remdesivir 100 mg in sodium chloride 0.9 % 100 mL IVPB  Status:  Discontinued       "Followed by" Linked Group Details   100 mg 200 mL/hr over 30 Minutes Intravenous Daily 09/06/20 0903 09/06/20 0904   09/06/20 0915  remdesivir 200 mg in sodium chloride 0.9% 250 mL IVPB  Status:  Discontinued       "Followed by" Linked Group Details   200 mg 580 mL/hr over 30 Minutes Intravenous Once 09/06/20 0903 09/06/20 0904   09/06/20 0600  remdesivir  200 mg in sodium chloride 0.9% 250 mL IVPB       "Followed by" Linked Group Details   200 mg 580 mL/hr over 30 Minutes Intravenous Once 09/06/20 0515 09/06/20 0928        Micro    Objective   Vitals:   09/11/20 1500 09/11/20 1600 09/11/20 1700 09/11/20 1856  BP:   125/83 129/71  Pulse: 99 89 63 (!) 102  Resp:   19   Temp:   97.8 F (36.6 C)   TempSrc:   Oral   SpO2: 98% 94% 90%   Weight:      Height:        Intake/Output Summary (Last 24 hours) at 09/11/2020 1914 Last data filed at 09/11/2020 1300 Gross per 24 hour  Intake 590 ml  Output 1500 ml  Net -910 ml   Filed Weights   09/06/20 2304 09/08/20 0550 09/09/20 0600  Weight: 84 kg 82 kg 81.5 kg    Physical Exam:  General exam: awake, alert, no acute distress HEENT: right lower lip early cold sore improving, moist mucus membranes, hearing grossly normal, tenderness to palpation over maxillary and frontal sinuses  Respiratory system: CTAB, diminished, no wheezes, rales or rhonchi, normal respiratory effort. On heated HFNC oxygen 45 L/min, 47% FiO2,  Cardiovascular system: normal S1/S2, RRR, no pedal edema.   Central nervous system: A&O x3. no gross focal neurologic deficits, normal speech Extremities: moves all, no edema, normal tone  Labs   Data Reviewed: I have personally reviewed following labs and imaging studies  CBC: Recent Labs  Lab 09/06/20 0408 09/07/20 0522 09/08/20 0626 09/09/20 0518 09/10/20 0532 09/11/20 0435  WBC 7.2 8.8 11.5* 9.2 10.4 10.5  NEUTROABS 6.1 8.2*  --   --   --   --   HGB 10.2* 10.9* 11.4* 11.0* 11.0* 10.5*  HCT 31.4* 32.6* 34.2* 34.5* 33.4* 32.8*  MCV 100.0 97.3 98.3 99.4 97.9 99.4  PLT 254 309 336 320 303 318   Basic Metabolic Panel: Recent Labs  Lab 09/07/20 0522 09/08/20 0626 09/09/20 0518 09/10/20 0532 09/11/20 0435  NA 138 143 141 138 140  K 3.1* 3.0* 3.4* 2.7* 3.6  CL 96* 97* 95* 94* 98  CO2 29 32 33* 32 30  GLUCOSE 158* 158* 143* 103* 149*  BUN 50* 50* 66*  66* 63*  CREATININE 1.31* 1.22* 1.57* 1.35* 1.32*  CALCIUM 9.5 9.9 10.0 9.5 9.3  MG 1.9 1.8 1.8 1.6* 1.9  PHOS 3.1  --   --   --   --    GFR: Estimated Creatinine Clearance: 37.8 mL/min (A) (by C-G formula based on SCr of 1.32 mg/dL (H)). Liver Function Tests: Recent Labs  Lab 09/06/20 0408 09/07/20 0522 09/10/20 0532 09/11/20 0435  AST 19 22 21 20   ALT 15 14 20 18   ALKPHOS 69 72 59 65  BILITOT 0.7 0.8 0.9 1.0  PROT 5.8* 6.6 5.9* 5.7*  ALBUMIN 2.7* 2.8* 2.7* 2.7*   No results for input(s): LIPASE, AMYLASE in the last 168 hours. No results for input(s): AMMONIA in the last 168 hours. Coagulation Profile: No results for input(s): INR, PROTIME in the last 168 hours. Cardiac Enzymes: No results for input(s): CKTOTAL, CKMB, CKMBINDEX, TROPONINI in the last 168 hours. BNP (last 3 results) No results for input(s): PROBNP in the last 8760 hours. HbA1C: No results for input(s): HGBA1C in the last 72 hours. CBG: No results for input(s): GLUCAP in the last 168 hours. Lipid Profile: No results for input(s): CHOL, HDL, LDLCALC, TRIG, CHOLHDL, LDLDIRECT in the last 72 hours. Thyroid Function Tests: No results for input(s): TSH, T4TOTAL, FREET4, T3FREE, THYROIDAB in the last 72 hours. Anemia Panel: No results for input(s): VITAMINB12, FOLATE, FERRITIN, TIBC, IRON, RETICCTPCT in the last 72 hours. Sepsis Labs: Recent Labs  Lab 09/06/20 0549 09/08/20 0626  PROCALCITON 0.10 <0.10    Recent Results (from the past 240 hour(s))  Resp Panel by RT-PCR (Flu A&B, Covid) Nasopharyngeal Swab     Status: Abnormal   Collection Time: 09/06/20  4:08 AM   Specimen: Nasopharyngeal Swab; Nasopharyngeal(NP) swabs in vial transport medium  Result Value Ref Range Status   SARS Coronavirus 2 by RT PCR POSITIVE (A) NEGATIVE Final    Comment: RESULT CALLED TO, READ BACK BY AND VERIFIED WITH: NOTIFIED JESSICA HUDSON RN 09/06/2020 0510 JG (NOTE) SARS-CoV-2 target nucleic acids are DETECTED.  The  SARS-CoV-2 RNA is generally detectable in upper respiratory specimens during the acute phase of infection. Positive results are  indicative of the presence of the identified virus, but do not rule out bacterial infection or co-infection with other pathogens not detected by the test. Clinical correlation with patient history and other diagnostic information is necessary to determine patient infection status. The expected result is Negative.  Fact Sheet for Patients: BloggerCourse.com  Fact Sheet for Healthcare Providers: SeriousBroker.it  This test is not yet approved or cleared by the [place] FDA and  has been authorized for detection and/or diagnosis of SARS-CoV-2 by FDA under an Emergency Use Authorization (EUA).  This EUA will remain in effect (meaning this  test can be used) for the duration of  the COVID-19 declaration under Section 564(b)(1) of the Act, 21 U.S.C. section 360bbb-3(b)(1), unless the authorization is terminated or revoked sooner.     Influenza A by PCR NEGATIVE NEGATIVE Final   Influenza B by PCR NEGATIVE NEGATIVE Final    Comment: (NOTE) The Xpert Xpress SARS-CoV-2/FLU/RSV plus assay is intended as an aid in the diagnosis of influenza from Nasopharyngeal swab specimens and should not be used as a sole basis for treatment. Nasal washings and aspirates are unacceptable for Xpert Xpress SARS-CoV-2/FLU/RSV testing.  Fact Sheet for Patients: BloggerCourse.com  Fact Sheet for Healthcare Providers: SeriousBroker.it  This test is not yet approved or cleared by the [place] FDA and has been authorized for detection and/or diagnosis of SARS-CoV-2 by FDA under an Emergency Use Authorization (EUA). This EUA will remain in effect (meaning this test can be used) for the duration of the COVID-19 declaration under Section 564(b)(1) of the Act, 21 U.S.C. section  360bbb-3(b)(1), unless the authorization is terminated or revoked.  Performed at [facility], [address] [place], [place] [address]       Imaging Studies   No results found.   Medications   Scheduled Meds: . acyclovir ointment   Topical Q3H  . albuterol  2 puff Inhalation Q6H  . apixaban  5 mg Oral BID  . vitamin C  1,000 mg Oral Daily  . azelastine  1 spray Each Nare Daily  . calcium-vitamin D  1 tablet Oral BID  . carbamazepine  100 mg Oral BID  . estradiol  1.5 mg Oral Daily  . flecainide  50 mg Oral Q12H  . fluticasone  2 spray Each Nare BID  . fluticasone  1 puff Inhalation BID  . furosemide  40 mg Oral BID  . loratadine  10 mg Oral Daily  . magic mouthwash  15 mL Oral TID  . magnesium gluconate  500 mg Oral BID  . montelukast  10 mg Oral QHS  . multivitamin-lutein  1 capsule Oral Daily  . oxybutynin  5 mg Oral QPM  . pantoprazole  40 mg Oral Daily  . potassium chloride  40 mEq Oral BID  . pramipexole  0.5 mg Oral QID  . predniSONE  50 mg Oral Daily  . sodium chloride flush  3 mL Intravenous Q12H  . sucralfate  1 g Oral TID  . verapamil  180 mg Oral Daily  . vitamin B-12  1,000 mcg Oral QHS   Continuous Infusions: . sodium chloride 250 mL (09/10/20 0914)       LOS: 5 days    Time spent: 30 minutes with > 50% spent at bedside and in coordination of care.    Pennie Banter, DO [facility]  09/11/2020, 7:14 PM      If 7PM-7AM, please contact night-coverage. How to contact the [facility] Attending or Consulting  provider 7A - 7P or covering provider during after hours 7P -7A, for this patient?    1. Check the care team in [facility] and look for a) attending/consulting TRH provider listed and b) the [facility] team listed 2. Log into [email or web address] and use [place]'s universal password to access. If you do not have the password, please contact the hospital operator. 3. Locate the [facility] provider you are looking for under [facility] and  page to a number that you can be directly reached. 4. If you still have difficulty reaching the provider, please page the [facility] (Director on Call) for the Hospitalists listed on amion for assistance.

## 2020-09-11 NOTE — Progress Notes (Signed)
Physical Therapy Treatment Patient Details Name: [name] MRN: [id number] DOB: 07-07-1940 Today's Date: 09/11/2020    History of Present Illness 80 y.o. female with medical history significant for atrial fibrillation on chronic anticoagulation therapy, history of CHF, hypertension who was brought into the ER by EMS for evaluation of shortness of breath and hypoxia.    PT Comments    Pt was very pleasant and motivated to participate during the session.  Pt required no physical assistance with functional tasks and reported no adverse symptoms during the session on 40L HHFNC.  Pt's SpO2 dropped to a low of 91% during the session with HR increasing to a max of  The low 120's.  Pt was steady with amb with only light touch to the RW for support.  Pt will benefit from HHPT upon discharge to safely address deficits listed in patient problem list for decreased caregiver assistance and eventual return to PLOF.      Follow Up Recommendations  Home health PT;Supervision for mobility/OOB     Equipment Recommendations  Rolling walker with 5" wheels    Recommendations for Other Services       Precautions / Restrictions Precautions Precautions: Fall Restrictions Weight Bearing Restrictions: No Other Position/Activity Restrictions: Torn RUE RTC    Mobility  Bed Mobility Overal bed mobility: Modified Independent             General bed mobility comments: Min extra time and effort only    Transfers Overall transfer level: Needs assistance Equipment used: Rolling walker (2 wheeled) Transfers: Sit to/from Stand Sit to Stand: Supervision         General transfer comment: Good eccentric and concentric control and stability  Ambulation/Gait Ambulation/Gait assistance: Min guard Gait Distance (Feet): 5 Feet Assistive device: Rolling walker (2 wheeled) Gait Pattern/deviations: Step-through pattern Gait velocity: decreased   General Gait Details: Slow cadence with short B step  length but steady without LOB with SpO2 >/= 91% and HR into the 120's on 40L HHFNC   Stairs             Wheelchair Mobility    Modified Rankin (Stroke Patients Only)       Balance Overall balance assessment: Needs assistance   Sitting balance-Leahy Scale: Normal     Standing balance support: Bilateral upper extremity supported;During functional activity Standing balance-Leahy Scale: Good                              Cognition Arousal/Alertness: Awake/alert Behavior During Therapy: WFL for tasks assessed/performed Overall Cognitive Status: Within Functional Limits for tasks assessed                                        Exercises Total Joint Exercises Ankle Circles/Pumps: AROM;Strengthening;Both;10 reps Quad Sets: Strengthening;Both;10 reps Gluteal Sets: Strengthening;Both;10 reps Hip ABduction/ADduction: Strengthening;Both;10 reps Straight Leg Raises: Strengthening;Both;10 reps Long Arc Quad: Strengthening;Both;10 reps Knee Flexion: 10 reps;Both;Strengthening    General Comments        Pertinent Vitals/Pain Pain Assessment: No/denies pain    Home Living                      Prior Function            PT Goals (current goals can now be found in the care plan section) Progress towards PT goals: Progressing  toward goals    Frequency    Min 2X/week      PT Plan Current plan remains appropriate    Co-evaluation              AM-PAC PT "6 Clicks" Mobility   Outcome Measure  Help needed turning from your back to your side while in a flat bed without using bedrails?: None Help needed moving from lying on your back to sitting on the side of a flat bed without using bedrails?: None Help needed moving to and from a bed to a chair (including a wheelchair)?: A Little Help needed standing up from a chair using your arms (e.g., wheelchair or bedside chair)?: A Little Help needed to walk in hospital room?: A  Little Help needed climbing 3-5 steps with a railing? : A Lot 6 Click Score: 19    End of Session Equipment Utilized During Treatment: Gait belt;Oxygen Activity Tolerance: Patient tolerated treatment well Patient left: with chair alarm set;with call bell/phone within reach;in chair Nurse Communication: Mobility status PT Visit Diagnosis: Muscle weakness (generalized) (M62.81);Difficulty in walking, not elsewhere classified (R26.2)     Time: 1410-1450 PT Time Calculation (min) (ACUTE ONLY): 40 min  Charges:  $Therapeutic Exercise: 8-22 mins $Therapeutic Activity: 23-37 mins                     D. Scott Ladrea Holladay PT, DPT 09/11/20, 4:04 PM

## 2020-09-12 LAB — COMPREHENSIVE METABOLIC PANEL
ALT: 19 U/L (ref 0–44)
AST: 18 U/L (ref 15–41)
Albumin: 2.9 g/dL — ABNORMAL LOW (ref 3.5–5.0)
Alkaline Phosphatase: 65 U/L (ref 38–126)
Anion gap: 14 (ref 5–15)
BUN: 68 mg/dL — ABNORMAL HIGH (ref 8–23)
CO2: 30 mmol/L (ref 22–32)
Calcium: 10 mg/dL (ref 8.9–10.3)
Chloride: 95 mmol/L — ABNORMAL LOW (ref 98–111)
Creatinine, Ser: 1.51 mg/dL — ABNORMAL HIGH (ref 0.44–1.00)
GFR, Estimated: 35 mL/min — ABNORMAL LOW (ref >60)
Glucose, Bld: 106 mg/dL — ABNORMAL HIGH (ref 70–99)
Potassium: 4.3 mmol/L (ref 3.5–5.1)
Sodium: 139 mmol/L (ref 135–145)
Total Bilirubin: 1 mg/dL (ref 0.3–1.2)
Total Protein: 6.1 g/dL — ABNORMAL LOW (ref 6.5–8.1)

## 2020-09-12 LAB — CBC
HCT: 35.2 % — ABNORMAL LOW (ref 36.0–46.0)
Hemoglobin: 11.6 g/dL — ABNORMAL LOW (ref 12.0–15.0)
MCH: 32.1 pg (ref 26.0–34.0)
MCHC: 33 g/dL (ref 30.0–36.0)
MCV: 97.5 fL (ref 80.0–100.0)
Platelets: 355 10*3/uL (ref 150–400)
RBC: 3.61 MIL/uL — ABNORMAL LOW (ref 3.87–5.11)
RDW: 14.1 % (ref 11.5–15.5)
WBC: 13 10*3/uL — ABNORMAL HIGH (ref 4.0–10.5)
nRBC: 0 % (ref 0.0–0.2)

## 2020-09-12 LAB — TROPONIN I (HIGH SENSITIVITY)
Troponin I (High Sensitivity): 17 ng/L (ref <18)
Troponin I (High Sensitivity): 17 ng/L (ref <18)

## 2020-09-12 MED ORDER — FUROSEMIDE 20 MG PO TABS
20.0000 mg | ORAL_TABLET | Freq: Two times a day (BID) | ORAL | Status: DC
  Administered 2020-09-12 – 2020-09-15 (×6): 20 mg via ORAL
  Filled 2020-09-12 (×6): qty 1

## 2020-09-12 MED ORDER — MORPHINE SULFATE (PF) 2 MG/ML IV SOLN
2.0000 mg | Freq: Once | INTRAVENOUS | Status: AC
  Administered 2020-09-12: 2 mg via INTRAVENOUS
  Filled 2020-09-12: qty 1

## 2020-09-12 NOTE — Evaluation (Addendum)
Occupational Therapy Evaluation Patient Details Name: [name] MRN: [id number] DOB: December 08, 1940 Today's Date: 09/12/2020    History of Present Illness 80 y.o. female with medical history significant for atrial fibrillation on chronic anticoagulation therapy, history of CHF, hypertension who was brought into the ER by EMS for evaluation of shortness of breath and hypoxia.   Clinical Impression   [name] presents to OT with limited endurance and activity tolerance that impacts her ability to safely and independently complete functional tasks.  Prior to admission, pt lived alone and completed basic ADLs with modified independence.  She is occasionally limited by recent R rotator cuff injury, but is generally able to modify/adapt as needed.  Pt has good support at home and presents with good safety awareness and motivation to improve in ADL safety.  Currently, pt is able to perform basic ADLs with intermittent min assist and use of RW.  Pt's main barrier is limited endurance, as she remains on 40L O2 (not on home oxygen prior to admission).  See below for pt's vitals and O2 stats with activity.  OTR provided supervision assist for bed mobility, contact guard assist for sit to stand transfer with RW, and total assist for perihygiene with pt standing with BUE support on RW (anticipate pt able to complete more independently with improvement in O2 status).  OTR provided education re: home safety considerations, fall prevention strategies, and techniques to support energy conservation.  [name] will continue to benefit from skilled OT services in acute setting to address endurance, functional strengthening, and safety and independence in ADLs.  Recommend HHOT after discharge to support safe transition into home environment.    Follow Up Recommendations  Home health OT;Supervision - Intermittent    Equipment Recommendations       Recommendations for Other Services       Precautions / Restrictions  Precautions Precautions: Fall Restrictions Weight Bearing Restrictions: No Other Position/Activity Restrictions: RUE rotator cuff injury limits weight bearing      Mobility Bed Mobility Overal bed mobility: Modified Independent             General bed mobility comments: Min extra time and effort only Patient Response: Cooperative  Transfers Overall transfer level: Needs assistance Equipment used: Rolling walker (2 wheeled) Transfers: Sit to/from Stand Sit to Stand: Supervision         General transfer comment: Modified technique 2/2 rotator cuff injury, but good control and safety awareness noted    Balance Overall balance assessment: Needs assistance   Sitting balance-Leahy Scale: Normal     Standing balance support: Bilateral upper extremity supported;During functional activity Standing balance-Leahy Scale: Good                             ADL either performed or assessed with clinical judgement   ADL Overall ADL's : Needs assistance/impaired Eating/Feeding: Set up;Sitting   Grooming: Sitting;Minimal assistance Grooming Details (indicate cue type and reason): intermittent min assist required 2/2 rotator cuff injury Upper Body Bathing: Minimal assistance;Sitting Upper Body Bathing Details (indicate cue type and reason): intermittent min assist required 2/2 rotator cuff injury Lower Body Bathing: Minimal assistance;Sit to/from stand Lower Body Bathing Details (indicate cue type and reason): per clinical reasoning, pt able to almost fully reach feet in seated position Upper Body Dressing : Minimal assistance;Sitting Upper Body Dressing Details (indicate cue type and reason): intermittent min assist required 2/2 rotator cuff injury Lower Body Dressing: Minimal assistance;Sit to/from  stand Lower Body Dressing Details (indicate cue type and reason): Pt able to almost fully reach feet in seated position Toilet Transfer: Supervision/safety;Min  guard;Stand-pivot;RW;BSC Toilet Transfer Details (indicate cue type and reason): OTR provided contact guard assist/supervision for monitoring O2 stats in sit to stand transfer with RW Toileting- Clothing Manipulation and Hygiene: Maximal assistance;Sit to/from stand Toileting - Clothing Manipulation Details (indicate cue type and reason): OTR provided max assist for perihygiene while pt stood with BUE support from RW, anticipate pt able to be more indepedent   Tub/Shower Transfer Details (indicate cue type and reason): not tested   General ADL Comments: Pt currently able to perform basic ADLs with intermittent min assist and RW, requires supervision for energy conservation and monitoring O2 stats and is intermittently limited by R rotator cuff injury.     Vision Patient Visual Report: No change from baseline       Perception     Praxis      Pertinent Vitals/Pain Pain Assessment: No/denies pain     Hand Dominance Right   Extremity/Trunk Assessment Upper Extremity Assessment Upper Extremity Assessment: RUE deficits/detail RUE Deficits / Details: painful and limited R shoulder elevation, elbow and hand functional (decreased R grip 2/2 injury)   Lower Extremity Assessment Lower Extremity Assessment: Defer to PT evaluation       Communication Communication Communication: No difficulties   Cognition Arousal/Alertness: Awake/alert Behavior During Therapy: WFL for tasks assessed/performed Overall Cognitive Status: Within Functional Limits for tasks assessed                                 General Comments: Pt pleasant and engaged throughout, has good safety awareness and motivation   General Comments  Pt on 40L HFNC.  At rest, HR = 90s, RR = 20, SpO2 = 97-99%.  With activity, HR = 120, RR =20-30, SpO2 = 88-93%.    Exercises Other Exercises Other Exercises: provided education re: OT role and plan of care, fall and safety precautions, equipment and AE  recommendations, energy conservation strategies Other Exercises: provided contact guard assist for sit to stand transfer with RW, max assist for perihygiene with pt standing with BUE support from RW   Shoulder Instructions      Home Living Family/patient expects to be discharged to:: Private residence Living Arrangements: Alone Available Help at Discharge: Family;Friend(s);Available 24 hours/day (pt reports multiple friends and family members coordinating to provide 24 hour care as needed after discharge) Type of Home: House Home Access: Ramped entrance     Home Layout: One level     Bathroom Shower/Tub: Walk-in shower (small threshold)   Bathroom Toilet: Handicapped height     Home Equipment: Environmental consultant - 4 wheels;Walker - 2 wheels;Shower seat;Bedside commode;Grab bars - toilet;Adaptive equipment;Cane - single point;Tub bench Adaptive Equipment: Reacher        Prior Functioning/Environment Level of Independence: Needs assistance  Gait / Transfers Assistance Needed: pt reports using no AD on "good days", will use SPC intermittently ADL's / Homemaking Assistance Needed: able to perform basic ADLs with mod I, though has been limited recently by R rotator cuff injury   Comments: Pt does not drive, receives assistance from friend for transportation and obtaining groceries.  Pt's friend helps her fill out her pill organizer, and pt takes medications independently.  Pt able to cook/perform basic cleaning and laundry, but has housecleaner 2x/month.  Pt enjoys sewing and spends majority of her day making cushions,  doll clothes, pillows, and various items for friends. Pt endorses two falls in past year, has good safety awareness and motivation to perform ADLs safely.        OT Problem List: Decreased strength;Decreased range of motion;Decreased activity tolerance;Cardiopulmonary status limiting activity;Impaired UE functional use      OT Treatment/Interventions: Self-care/ADL  training;Therapeutic exercise;Energy conservation;Therapeutic activities;Patient/family education    OT Goals(Current goals can be found in the care plan section) Acute Rehab OT Goals Patient Stated Goal: Go home OT Goal Formulation: With patient Time For Goal Achievement: 09/26/20 Potential to Achieve Goals: Good  OT Frequency: Min 2X/week   Barriers to D/C:            Co-evaluation              AM-PAC OT "6 Clicks" Daily Activity     Outcome Measure Help from another person eating meals?: None Help from another person taking care of personal grooming?: A Little Help from another person toileting, which includes using toliet, bedpan, or urinal?: A Little Help from another person bathing (including washing, rinsing, drying)?: A Little Help from another person to put on and taking off regular upper body clothing?: A Little Help from another person to put on and taking off regular lower body clothing?: A Little 6 Click Score: 19   End of Session Equipment Utilized During Treatment: Gait belt;Rolling walker;Oxygen  Activity Tolerance: Patient tolerated treatment well Patient left: in bed;with call bell/phone within reach;with bed alarm set  OT Visit Diagnosis: Other abnormalities of gait and mobility (R26.89);Muscle weakness (generalized) (M62.81)                Time: 8676-7209 OT Time Calculation (min): 62 min Charges:  OT General Charges $OT Visit: 1 Visit OT Evaluation $OT Eval Moderate Complexity: 1 Mod OT Treatments $Self Care/Home Management : 38-52 mins  Kathyrn Drown Cathlin Buchan, OTR/L 09/12/20, 12:54 PM

## 2020-09-12 NOTE — Progress Notes (Signed)
Patient called out and said that she was having trouble breathing. Upon entering the room patient's oxygen saturation is 98% on Hi Flo. Patient is crying and complains of chest pain at 20 on a scale of 1-10. Patient states that it feels like someone is sitting on her chest. MD was notified. Troponin and EKG were ordered.

## 2020-09-12 NOTE — Progress Notes (Signed)
PROGRESS NOTE    [name]   NIO:[id number]  DOB: [birth year]  PCP: Danella Penton, MD    DOA: 09/06/2020 LOS: 6   Brief Narrative   [name] is a 80 y.o. female with medical history significant for atrial fibrillation on chronic anticoagulation therapy, history of CHF, hypertension who was brought into the ER by EMS for evaluation of shortness of breath and hypoxia.  She was found to have pneumonia due to Covid-19, admitted to hospital for further management.  Has had significant oxygen requirements on heated HFNC at 45 L/min 70% FiO2.     Assessment & Plan   Principal Problem:   Pneumonia due to COVID-19 virus Active Problems:   Atrial fibrillation (HCC)   Anxiety   GERD (gastroesophageal reflux disease)   Acute on chronic diastolic CHF (congestive heart failure) (HCC)   Acute CHF (congestive heart failure) (HCC)   Acute respiratory failure due to COVID-19 (HCC)   Anemia of chronic disease   Frequent falls   Acute respiratory failure with hypoxia due to COVID-19 multifocal pneumonia -  Patient has received 3 doses of COVID-19 vaccine. Chest x-ray showed multifocal pneumonia and SPO2 on arrival was 45% on room air. She was started on remdesivir and IV Solu-Medrol. Continues to have significant requirement for supplemental oxygen, on heated HFNC at 45 L/min. -- Continue remdesivir - last day today 4/21 -- Continue prednisone 50 mg daily, initially on IV Solu-medrol -- Supplemental oxygen as needed to maintain sat greater than 90%, wean as tolerated -- Supportive care: Bronchodilators, antitussives, incentive spirometer  Chest pain - suspect esophageal spasm / upper GI source.  ECG with nonspecific T wave changes and troponin normal x2.  Monitor.  Acute sinusitis - with hx of recurrent sinus infections, facial pain/pressure and tenderness, significant thick post-nasal drainage. --Started doxycycline 4/23 (pcn allergic) --Continue azelastine spray, Flonase,  loratadine  Chronic diastolic CHF -appears euvolemic and well compensated at this time. Takes Lasix 80 mg daily at home, was ordered 40 mg twice daily on admission. --Hold oral Lasix given bump in creatinine 4/20 --Continue monitor renal function and electrolytes -- Monitor volume status: daily weights  Acute kidney injury -POA with creatinine 1.96.  Improved to CR 1.22, bumped 4/20 to Cr 1.57. -- Hold Lasix, may resume tomorrow if renal function improved. -- Monitor BMP daily  Frequent falls -2 falls within the past month reported, sustained injury to her right shoulder rotator cuff. -- Fall precautions -- PT/OT  Hypokalemia -replaced and resolved. -- Monitor BMP, Mg and further replacement as indicated  History of paroxysmal A. Fib -rate controlled. -- Continue flecainide, verapamil, Eliquis -- Telemetry monitoring -- EKG pending, monitor QTc  Essential hypertension -BP was low on admission but has trended up and currently at goal. -- Continue verapamil, metoprolol -- Hold Lasix given bump in creatinine 4/20 -- Hold HCTZ  Hx of recurrent sinusitis - pt does have some congestion, suspect due to heated HF oxygen. 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 --topical acyclovir ordered  Restless leg syndrome -patient reports exacerbation due to being in the bed so much. -- Continue pramipexole (dose was increased from 0.25  >> 0.5mg  QID)  History of depression and anxiety -continue trazodone, alprazolam, fluoxetine  GERD -continue Protonix, Carafate  Anemia of chronic disease -hemoglobin stable.  No evidence of bleeding. --Monitor CBC  Allergic rhinitis -continue Flonase, Claritin, Singulair, azelastine nasal spray  History of muscle spasms -continue as needed Zanaflex  Continued on home meds: vitamin B12, Tegretol,  vitamin C, multivitamins, oxybutynin, estradiol, colchicine   Patient BMI: Body mass index is 27.81 kg/m.   DVT prophylaxis:  apixaban (ELIQUIS) tablet 5 mg   Diet:  Diet Orders (From admission, onward)    Start     Ordered   09/06/20 0902  Diet 2 gram sodium Room service appropriate? Yes; Fluid consistency: Thin  Diet effective now       Question Answer Comment  Room service appropriate? Yes   Fluid consistency: Thin      09/06/20 0903            Code Status: Full Code    Subjective 09/12/20    Patient seen this morning awake sitting up in bed.  Earlier AM she had sudden onset 10/10 chest pain radiating to her back.  EKG and troponin were normal, pt was relieved to hear.  Does have hiatal hernia and reports occasional issues with passing things.  CP was relieved by morphine but got headache after it.  Chest pain has fully resolved.  Slept better.     Disposition Plan & Communication   Status is: Inpatient  Remains inpatient appropriate because:Inpatient level of care appropriate due to severity of illness -patient COVID-19 pneumonia continues to have significant requirements for supplemental oxygen, on heated high flow.   Dispo: The patient is from: Home              Anticipated d/c is to: Home              Patient currently is not medically stable to d/c.   Difficult to place patient No   Consults, Procedures, Significant Events   Consultants:   None  Procedures:   None  Antimicrobials:  Anti-infectives (From admission, onward)   Start     Dose/Rate Route Frequency Ordered Stop   09/11/20 2200  doxycycline (VIBRA-TABS) tablet 100 mg        100 mg Oral Every 12 hours 09/11/20 1924     09/07/20 1000  remdesivir 100 mg in sodium chloride 0.9 % 100 mL IVPB       "Followed by" Linked Group Details   100 mg 200 mL/hr over 30 Minutes Intravenous Daily 09/06/20 0515 09/10/20 0945   09/07/20 1000  remdesivir 100 mg in sodium chloride 0.9 % 100 mL IVPB  Status:   Discontinued       "Followed by" Linked Group Details   100 mg 200 mL/hr over 30 Minutes Intravenous Daily 09/06/20 0903 09/06/20 0904   09/06/20 0915  remdesivir 200 mg in sodium chloride 0.9% 250 mL IVPB  Status:  Discontinued       "Followed by" Linked Group Details   200 mg 580 mL/hr over 30 Minutes Intravenous Once 09/06/20 0903 09/06/20 0904   09/06/20 0600  remdesivir 200 mg in sodium chloride 0.9% 250 mL IVPB       "Followed by" Linked Group Details   200 mg 580 mL/hr over 30 Minutes Intravenous Once 09/06/20 0515 09/06/20 0928        Micro    Objective   Vitals:   09/12/20 0756 09/12/20 0927 09/12/20 1244 09/12/20 1732  BP: 132/77 128/65 (!) 113/55 119/65  Pulse: 81     Resp: (!) 22     Temp: 98.6 F (37 C) 98.6 F (37 C) 98.6 F (37 C) 98.7  F (37.1 C)  TempSrc: Oral Oral Oral Oral  SpO2: 94%     Weight:      Height:        Intake/Output Summary (Last 24 hours) at 09/12/2020 1805 Last data filed at 09/12/2020 1732 Gross per 24 hour  Intake 480 ml  Output 1550 ml  Net -1070 ml   Filed Weights   09/08/20 0550 09/09/20 0600 09/12/20 0454  Weight: 82 kg 81.5 kg 78.2 kg    Physical Exam:  General exam: awake, alert, no acute distress Respiratory system: CTAB, diminished, no wheezes, rales or rhonchi, normal respiratory effort. On heated HFNC oxygen 40 L/min, 40% FiO2,  Cardiovascular system: normal S1/S2, RRR, no pedal edema.   Central nervous system: A&O x3. no gross focal neurologic deficits, normal speech Extremities: moves all, no edema, normal tone  Labs   Data Reviewed: I have personally reviewed following labs and imaging studies  CBC: Recent Labs  Lab 09/06/20 0408 09/07/20 0522 09/08/20 2563 09/09/20 0518 09/10/20 0532 09/11/20 0435 09/12/20 0516  WBC 7.2 8.8 11.5* 9.2 10.4 10.5 13.0*  NEUTROABS 6.1 8.2*  --   --   --   --   --   HGB 10.2* 10.9* 11.4* 11.0* 11.0* 10.5* 11.6*  HCT 31.4* 32.6* 34.2* 34.5* 33.4* 32.8* 35.2*  MCV 100.0  97.3 98.3 99.4 97.9 99.4 97.5  PLT 254 309 336 320 303 318 355   Basic Metabolic Panel: Recent Labs  Lab 09/07/20 0522 09/08/20 0626 09/09/20 0518 09/10/20 0532 09/11/20 0435 09/12/20 0516  NA 138 143 141 138 140 139  K 3.1* 3.0* 3.4* 2.7* 3.6 4.3  CL 96* 97* 95* 94* 98 95*  CO2 29 32 33* 32 30 30  GLUCOSE 158* 158* 143* 103* 149* 106*  BUN 50* 50* 66* 66* 63* 68*  CREATININE 1.31* 1.22* 1.57* 1.35* 1.32* 1.51*  CALCIUM 9.5 9.9 10.0 9.5 9.3 10.0  MG 1.9 1.8 1.8 1.6* 1.9  --   PHOS 3.1  --   --   --   --   --    GFR: Estimated Creatinine Clearance: 32.4 mL/min (A) (by C-G formula based on SCr of 1.51 mg/dL (H)). Liver Function Tests: Recent Labs  Lab 09/06/20 0408 09/07/20 0522 09/10/20 0532 09/11/20 0435 09/12/20 0516  AST 19 22 21 20 18   ALT 15 14 20 18 19   ALKPHOS 69 72 59 65 65  BILITOT 0.7 0.8 0.9 1.0 1.0  PROT 5.8* 6.6 5.9* 5.7* 6.1*  ALBUMIN 2.7* 2.8* 2.7* 2.7* 2.9*   No results for input(s): LIPASE, AMYLASE in the last 168 hours. No results for input(s): AMMONIA in the last 168 hours. Coagulation Profile: No results for input(s): INR, PROTIME in the last 168 hours. Cardiac Enzymes: No results for input(s): CKTOTAL, CKMB, CKMBINDEX, TROPONINI in the last 168 hours. BNP (last 3 results) No results for input(s): PROBNP in the last 8760 hours. HbA1C: No results for input(s): HGBA1C in the last 72 hours. CBG: No results for input(s): GLUCAP in the last 168 hours. Lipid Profile: No results for input(s): CHOL, HDL, LDLCALC, TRIG, CHOLHDL, LDLDIRECT in the last 72 hours. Thyroid Function Tests: No results for input(s): TSH, T4TOTAL, FREET4, T3FREE, THYROIDAB in the last 72 hours. Anemia Panel: No results for input(s): VITAMINB12, FOLATE, FERRITIN, TIBC, IRON, RETICCTPCT in the last 72 hours. Sepsis Labs: Recent Labs  Lab 09/06/20 0549 09/08/20 0626  PROCALCITON 0.10 <0.10    Recent Results (from the past 240 hour(s))  Resp Panel by  RT-PCR (Flu A&B,  Covid) Nasopharyngeal Swab     Status: Abnormal   Collection Time: 09/06/20  4:08 AM   Specimen: Nasopharyngeal Swab; Nasopharyngeal(NP) swabs in vial transport medium  Result Value Ref Range Status   SARS Coronavirus 2 by RT PCR POSITIVE (A) NEGATIVE Final    Comment: RESULT CALLED TO, READ BACK BY AND VERIFIED WITH: NOTIFIED JESSICA HUDSON RN 09/06/2020 0510 JG (NOTE) SARS-CoV-2 target nucleic acids are DETECTED.  The SARS-CoV-2 RNA is generally detectable in upper respiratory specimens during the acute phase of infection. Positive results are indicative of the presence of the identified virus, but do not rule out bacterial infection or co-infection with other pathogens not detected by the test. Clinical correlation with patient history and other diagnostic information is necessary to determine patient infection status. The expected result is Negative.  Fact Sheet for Patients: BloggerCourse.com  Fact Sheet for Healthcare Providers: SeriousBroker.it  This test is not yet approved or cleared by the [place] FDA and  has been authorized for detection and/or diagnosis of SARS-CoV-2 by FDA under an Emergency Use Authorization (EUA).  This EUA will remain in effect (meaning this  test can be used) for the duration of  the COVID-19 declaration under Section 564(b)(1) of the Act, 21 U.S.C. section 360bbb-3(b)(1), unless the authorization is terminated or revoked sooner.     Influenza A by PCR NEGATIVE NEGATIVE Final   Influenza B by PCR NEGATIVE NEGATIVE Final    Comment: (NOTE) The Xpert Xpress SARS-CoV-2/FLU/RSV plus assay is intended as an aid in the diagnosis of influenza from Nasopharyngeal swab specimens and should not be used as a sole basis for treatment. Nasal washings and aspirates are unacceptable for Xpert Xpress SARS-CoV-2/FLU/RSV testing.  Fact Sheet for  Patients: BloggerCourse.com  Fact Sheet for Healthcare Providers: SeriousBroker.it  This test is not yet approved or cleared by the [place] FDA and has been authorized for detection and/or diagnosis of SARS-CoV-2 by FDA under an Emergency Use Authorization (EUA). This EUA will remain in effect (meaning this test can be used) for the duration of the COVID-19 declaration under Section 564(b)(1) of the Act, 21 U.S.C. section 360bbb-3(b)(1), unless the authorization is terminated or revoked.  Performed at [facility], [address] [place], [place] [address]       Imaging Studies   No results found.   Medications   Scheduled Meds: . acyclovir ointment   Topical Q3H  . albuterol  2 puff Inhalation Q6H  . apixaban  5 mg Oral BID  . vitamin C  1,000 mg Oral Daily  . azelastine  1 spray Each Nare Daily  . calcium-vitamin D  1 tablet Oral BID  . carbamazepine  100 mg Oral BID  . doxycycline  100 mg Oral Q12H  . estradiol  1.5 mg Oral Daily  . flecainide  50 mg Oral Q12H  . fluticasone  2 spray Each Nare BID  . fluticasone  1 puff Inhalation BID  . furosemide  20 mg Oral BID  . loratadine  10 mg Oral Daily  . magic mouthwash  15 mL Oral TID  . magnesium gluconate  500 mg Oral BID  . montelukast  10 mg Oral QHS  . multivitamin-lutein  1 capsule Oral Daily  . oxybutynin  5 mg Oral QPM  . pantoprazole  40 mg Oral Daily  . pramipexole  0.5 mg Oral QID  . predniSONE  50 mg Oral Daily  . sodium chloride flush  3 mL  Intravenous Q12H  . sucralfate  1 g Oral TID  . verapamil  180 mg Oral Daily  . vitamin B-12  1,000 mcg Oral QHS   Continuous Infusions: . sodium chloride 250 mL (09/10/20 0914)       LOS: 6 days    Time spent: 30 minutes with > 50% spent at bedside and in coordination of care.    Pennie BanterKelly A Fern Canova, DO [facility]  09/12/2020, 6:05 PM      If 7PM-7AM, please contact  night-coverage. How to contact the [facility] Attending or Consulting provider 7A - 7P or covering provider during after hours 7P -7A, for this patient?    1. Check the care team in [facility] and look for a) attending/consulting [facility] provider listed and b) the [facility] team listed 2. Log into [email or web address] and use [facility]'s universal password to access. If you do not have the password, please contact the hospital operator. 3. Locate the [facility] provider you are looking for under [facility] and page to a number that you can be directly reached. 4. If you still have difficulty reaching the provider, please page the [facility] (Director on Call) for the Hospitalists listed on amion for assistance.

## 2020-09-13 LAB — CBC
HCT: 34.1 % — ABNORMAL LOW (ref 36.0–46.0)
Hemoglobin: 10.9 g/dL — ABNORMAL LOW (ref 12.0–15.0)
MCH: 31.8 pg (ref 26.0–34.0)
MCHC: 32 g/dL (ref 30.0–36.0)
MCV: 99.4 fL (ref 80.0–100.0)
Platelets: 334 10*3/uL (ref 150–400)
RBC: 3.43 MIL/uL — ABNORMAL LOW (ref 3.87–5.11)
RDW: 13.9 % (ref 11.5–15.5)
WBC: 14.1 10*3/uL — ABNORMAL HIGH (ref 4.0–10.5)
nRBC: 0 % (ref 0.0–0.2)

## 2020-09-13 LAB — BASIC METABOLIC PANEL
Anion gap: 12 (ref 5–15)
BUN: 64 mg/dL — ABNORMAL HIGH (ref 8–23)
CO2: 29 mmol/L (ref 22–32)
Calcium: 9.7 mg/dL (ref 8.9–10.3)
Chloride: 96 mmol/L — ABNORMAL LOW (ref 98–111)
Creatinine, Ser: 1.34 mg/dL — ABNORMAL HIGH (ref 0.44–1.00)
GFR, Estimated: 41 mL/min — ABNORMAL LOW (ref >60)
Glucose, Bld: 109 mg/dL — ABNORMAL HIGH (ref 70–99)
Potassium: 3.9 mmol/L (ref 3.5–5.1)
Sodium: 137 mmol/L (ref 135–145)

## 2020-09-13 LAB — C-REACTIVE PROTEIN: CRP: 1.2 mg/dL — ABNORMAL HIGH (ref <1.0)

## 2020-09-13 MED ORDER — BENZOCAINE 10 % MT GEL
Freq: Four times a day (QID) | OROMUCOSAL | Status: DC | PRN
  Filled 2020-09-13 (×2): qty 9

## 2020-09-13 NOTE — Progress Notes (Signed)
PROGRESS NOTE    DAROTHY COURTRIGHT   ZOX:[id number]  DOB: [birth year]  PCP: Danella Penton, MD    DOA: 09/06/2020 LOS: 7   Brief Narrative   [name] is a 80 y.o. female with medical history significant for atrial fibrillation on chronic anticoagulation therapy, history of CHF, hypertension who was brought into the ER by EMS for evaluation of shortness of breath and hypoxia.  She was found to have pneumonia due to Covid-19, admitted to hospital for further management.  Has had significant oxygen requirements on heated HFNC at 45 L/min 70% FiO2.     Assessment & Plan   Principal Problem:   Pneumonia due to COVID-19 virus Active Problems:   Atrial fibrillation (HCC)   Anxiety   GERD (gastroesophageal reflux disease)   Acute on chronic diastolic CHF (congestive heart failure) (HCC)   Acute CHF (congestive heart failure) (HCC)   Acute respiratory failure due to COVID-19 (HCC)   Anemia of chronic disease   Frequent falls   Acute respiratory failure with hypoxia due to COVID-19 multifocal pneumonia -  Patient has received 3 doses of COVID-19 vaccine. Chest x-ray showed multifocal pneumonia and SPO2 on arrival was 45% on room air. She was started on remdesivir and IV Solu-Medrol. Continues to have significant requirement for supplemental oxygen, on heated HFNC at 45 L/min. -- Continue remdesivir - last day today 4/21 -- Continue prednisone 50 mg daily, initially on IV Solu-medrol -- Supplemental oxygen as needed to maintain sat greater than 90%, wean as tolerated -- Supportive care: Bronchodilators, antitussives, incentive spirometer  Chest pain - suspect esophageal spasm / upper GI source.  ECG with nonspecific T wave changes and troponin normal x2.  Monitor.  Acute sinusitis - with hx of recurrent sinus infections, facial pain/pressure and tenderness, significant thick post-nasal drainage. --Started doxycycline 4/23 (pcn allergic) --Continue azelastine spray, Flonase,  loratadine  Chronic diastolic CHF -appears euvolemic and well compensated at this time. Takes Lasix 80 mg daily at home, was ordered 40 mg twice daily on admission. --Hold oral Lasix given bump in creatinine 4/20 --Continue monitor renal function and electrolytes -- Monitor volume status: daily weights  Acute kidney injury -POA with creatinine 1.96.  Improved to CR 1.22, bumped 4/20 to Cr 1.57. -- Hold Lasix, may resume tomorrow if renal function improved. -- Monitor BMP daily  Frequent falls -2 falls within the past month reported, sustained injury to her right shoulder rotator cuff. -- Fall precautions -- PT/OT  Hypokalemia -replaced and resolved. -- Monitor BMP, Mg and further replacement as indicated  History of paroxysmal A. Fib -rate controlled. -- Continue flecainide, verapamil, Eliquis -- Telemetry monitoring -- EKG pending, monitor QTc  Essential hypertension -BP was low on admission but has trended up and currently at goal. -- Continue verapamil, metoprolol -- Hold Lasix given bump in creatinine 4/20 -- Hold HCTZ  Hx of recurrent sinusitis - pt does have some congestion, suspect due to heated HF oxygen. 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 --topical acyclovir ordered  Restless leg syndrome -patient reports exacerbation due to being in the bed so much. -- Continue pramipexole (dose was increased from 0.25  >> 0.5mg  QID)  History of depression and anxiety -continue trazodone, alprazolam, fluoxetine  GERD -continue Protonix, Carafate  Anemia of chronic disease -hemoglobin stable.  No evidence of bleeding. --Monitor CBC  Allergic rhinitis -continue Flonase, Claritin, Singulair, azelastine nasal spray  History of muscle spasms -continue as needed Zanaflex  Continued on home meds: vitamin B12, Tegretol,  vitamin C, multivitamins, oxybutynin, estradiol, colchicine   Patient BMI: Body mass index is 28.76 kg/m.   DVT prophylaxis:  apixaban (ELIQUIS) tablet 5 mg   Diet:  Diet Orders (From admission, onward)    Start     Ordered   09/06/20 0902  Diet 2 gram sodium Room service appropriate? Yes; Fluid consistency: Thin  Diet effective now       Question Answer Comment  Room service appropriate? Yes   Fluid consistency: Thin      09/06/20 0903            Code Status: Full Code    Subjective 09/13/20    Patient still has maxillary sinus pain / pressure, but ears are much better.  Breathing is stable.  Reports developing a blister due to her upper denture, requests Orajel.  No other acute complaints.   Disposition Plan & Communication   Status is: Inpatient  Remains inpatient appropriate because:Inpatient level of care appropriate due to severity of illness -patient COVID-19 pneumonia continues to have significant requirements for supplemental oxygen, on heated high flow.   Dispo: The patient is from: Home              Anticipated d/c is to: Home              Patient currently is not medically stable to d/c.   Difficult to place patient No   Consults, Procedures, Significant Events   Consultants:   None  Procedures:   None  Antimicrobials:  Anti-infectives (From admission, onward)   Start     Dose/Rate Route Frequency Ordered Stop   09/11/20 2200  doxycycline (VIBRA-TABS) tablet 100 mg        100 mg Oral Every 12 hours 09/11/20 1924     09/07/20 1000  remdesivir 100 mg in sodium chloride 0.9 % 100 mL IVPB       "Followed by" Linked Group Details   100 mg 200 mL/hr over 30 Minutes Intravenous Daily 09/06/20 0515 09/10/20 0945   09/07/20 1000  remdesivir 100 mg in sodium chloride 0.9 % 100 mL IVPB  Status:  Discontinued       "Followed by" Linked Group Details   100 mg 200 mL/hr over 30 Minutes Intravenous Daily 09/06/20 0903 09/06/20 0904   09/06/20 0915   remdesivir 200 mg in sodium chloride 0.9% 250 mL IVPB  Status:  Discontinued       "Followed by" Linked Group Details   200 mg 580 mL/hr over 30 Minutes Intravenous Once 09/06/20 0903 09/06/20 0904   09/06/20 0600  remdesivir 200 mg in sodium chloride 0.9% 250 mL IVPB       "Followed by" Linked Group Details   200 mg 580 mL/hr over 30 Minutes Intravenous Once 09/06/20 0515 09/06/20 0928        Micro    Objective   Vitals:   09/13/20 0454 09/13/20 0500 09/13/20 0515 09/13/20 1220  BP: 133/61     Pulse: 64     Resp: 19     Temp: (!) 97.3 F (36.3 C)     TempSrc:      SpO2: 95% (!) 83% 92% 90%  Weight: 80.8 kg     Height:        Intake/Output Summary (Last 24 hours) at 09/13/2020 1613 Last data filed at  09/13/2020 0522 Gross per 24 hour  Intake 480 ml  Output 1000 ml  Net -520 ml   Filed Weights   09/09/20 0600 09/12/20 0454 09/13/20 0454  Weight: 81.5 kg 78.2 kg 80.8 kg    Physical Exam:  General exam: awake, alert, no acute distress, sitting up in bed Respiratory system: diminished but overall clear b/l, normal respiratory effort. On heated HFNC oxygen 40 L/min, 50% FiO2,  Cardiovascular system: normal S1/S2, RRR, no pedal edema.   Central nervous system: A&O x3. no gross focal neurologic deficits, normal speech Extremities: moves all, no edema, normal tone  Labs   Data Reviewed: I have personally reviewed following labs and imaging studies  CBC: Recent Labs  Lab 09/07/20 0522 09/08/20 0626 09/09/20 0518 09/10/20 0532 09/11/20 0435 09/12/20 0516 09/13/20 0430  WBC 8.8   < > 9.2 10.4 10.5 13.0* 14.1*  NEUTROABS 8.2*  --   --   --   --   --   --   HGB 10.9*   < > 11.0* 11.0* 10.5* 11.6* 10.9*  HCT 32.6*   < > 34.5* 33.4* 32.8* 35.2* 34.1*  MCV 97.3   < > 99.4 97.9 99.4 97.5 99.4  PLT 309   < > 320 303 318 355 334   < > = values in this interval not displayed.   Basic Metabolic Panel: Recent Labs  Lab 09/07/20 0522 09/08/20 9562 09/09/20 0518  09/10/20 0532 09/11/20 0435 09/12/20 0516 09/13/20 0430  NA 138 143 141 138 140 139 137  K 3.1* 3.0* 3.4* 2.7* 3.6 4.3 3.9  CL 96* 97* 95* 94* 98 95* 96*  CO2 29 32 33* 32 30 30 29   GLUCOSE 158* 158* 143* 103* 149* 106* 109*  BUN 50* 50* 66* 66* 63* 68* 64*  CREATININE 1.31* 1.22* 1.57* 1.35* 1.32* 1.51* 1.34*  CALCIUM 9.5 9.9 10.0 9.5 9.3 10.0 9.7  MG 1.9 1.8 1.8 1.6* 1.9  --   --   PHOS 3.1  --   --   --   --   --   --    GFR: Estimated Creatinine Clearance: 37.1 mL/min (A) (by C-G formula based on SCr of 1.34 mg/dL (H)). Liver Function Tests: Recent Labs  Lab 09/07/20 0522 09/10/20 0532 09/11/20 0435 09/12/20 0516  AST 22 21 20 18   ALT 14 20 18 19   ALKPHOS 72 59 65 65  BILITOT 0.8 0.9 1.0 1.0  PROT 6.6 5.9* 5.7* 6.1*  ALBUMIN 2.8* 2.7* 2.7* 2.9*   No results for input(s): LIPASE, AMYLASE in the last 168 hours. No results for input(s): AMMONIA in the last 168 hours. Coagulation Profile: No results for input(s): INR, PROTIME in the last 168 hours. Cardiac Enzymes: No results for input(s): CKTOTAL, CKMB, CKMBINDEX, TROPONINI in the last 168 hours. BNP (last 3 results) No results for input(s): PROBNP in the last 8760 hours. HbA1C: No results for input(s): HGBA1C in the last 72 hours. CBG: No results for input(s): GLUCAP in the last 168 hours. Lipid Profile: No results for input(s): CHOL, HDL, LDLCALC, TRIG, CHOLHDL, LDLDIRECT in the last 72 hours. Thyroid Function Tests: No results for input(s): TSH, T4TOTAL, FREET4, T3FREE, THYROIDAB in the last 72 hours. Anemia Panel: No results for input(s): VITAMINB12, FOLATE, FERRITIN, TIBC, IRON, RETICCTPCT in the last 72 hours. Sepsis Labs: Recent Labs  Lab 09/08/20 0626  PROCALCITON <0.10    Recent Results (from the past 240 hour(s))  Resp Panel by RT-PCR (Flu A&B, Covid) Nasopharyngeal Swab  Status: Abnormal   Collection Time: 09/06/20  4:08 AM   Specimen: Nasopharyngeal Swab; Nasopharyngeal(NP) swabs in vial  transport medium  Result Value Ref Range Status   SARS Coronavirus 2 by RT PCR POSITIVE (A) NEGATIVE Final    Comment: RESULT CALLED TO, READ BACK BY AND VERIFIED WITH: NOTIFIED JESSICA HUDSON RN 09/06/2020 0510 JG (NOTE) SARS-CoV-2 target nucleic acids are DETECTED.  The SARS-CoV-2 RNA is generally detectable in upper respiratory specimens during the acute phase of infection. Positive results are indicative of the presence of the identified virus, but do not rule out bacterial infection or co-infection with other pathogens not detected by the test. Clinical correlation with patient history and other diagnostic information is necessary to determine patient infection status. The expected result is Negative.  Fact Sheet for Patients: BloggerCourse.com  Fact Sheet for Healthcare Providers: SeriousBroker.it  This test is not yet approved or cleared by the [place] FDA and  has been authorized for detection and/or diagnosis of SARS-CoV-2 by FDA under an Emergency Use Authorization (EUA).  This EUA will remain in effect (meaning this  test can be used) for the duration of  the COVID-19 declaration under Section 564(b)(1) of the Act, 21 U.S.C. section 360bbb-3(b)(1), unless the authorization is terminated or revoked sooner.     Influenza A by PCR NEGATIVE NEGATIVE Final   Influenza B by PCR NEGATIVE NEGATIVE Final    Comment: (NOTE) The Xpert Xpress SARS-CoV-2/FLU/RSV plus assay is intended as an aid in the diagnosis of influenza from Nasopharyngeal swab specimens and should not be used as a sole basis for treatment. Nasal washings and aspirates are unacceptable for Xpert Xpress SARS-CoV-2/FLU/RSV testing.  Fact Sheet for Patients: BloggerCourse.com  Fact Sheet for Healthcare Providers: SeriousBroker.it  This test is not yet approved or cleared by the [place] FDA  and has been authorized for detection and/or diagnosis of SARS-CoV-2 by FDA under an Emergency Use Authorization (EUA). This EUA will remain in effect (meaning this test can be used) for the duration of the COVID-19 declaration under Section 564(b)(1) of the Act, 21 U.S.C. section 360bbb-3(b)(1), unless the authorization is terminated or revoked.  Performed at [facility] [address] [place], [place] [address]       Imaging Studies   No results found.   Medications   Scheduled Meds: . acyclovir ointment   Topical Q3H  . albuterol  2 puff Inhalation Q6H  . apixaban  5 mg Oral BID  . vitamin C  1,000 mg Oral Daily  . azelastine  1 spray Each Nare Daily  . calcium-vitamin D  1 tablet Oral BID  . carbamazepine  100 mg Oral BID  . doxycycline  100 mg Oral Q12H  . estradiol  1.5 mg Oral Daily  . flecainide  50 mg Oral Q12H  . fluticasone  2 spray Each Nare BID  . fluticasone  1 puff Inhalation BID  . furosemide  20 mg Oral BID  . loratadine  10 mg Oral Daily  . magic mouthwash  15 mL Oral TID  . magnesium gluconate  500 mg Oral BID  . montelukast  10 mg Oral QHS  . multivitamin-lutein  1 capsule Oral Daily  . oxybutynin  5 mg Oral QPM  . pantoprazole  40 mg Oral Daily  . pramipexole  0.5 mg Oral QID  . predniSONE  50 mg Oral Daily  . sodium chloride flush  3 mL Intravenous Q12H  . sucralfate  1 g Oral TID  .  verapamil  180 mg Oral Daily  . vitamin B-12  1,000 mcg Oral QHS   Continuous Infusions: . sodium chloride 250 mL (09/10/20 0914)       LOS: 7 days    Time spent: 25 minutes with > 50% spent at bedside and in coordination of care.    Pennie Banter, DO [facility]  09/13/2020, 4:13 PM      If 7PM-7AM, please contact night-coverage. How to contact the [facility] Attending or Consulting provider 7A - 7P or covering provider during after hours 7P -7A, for this patient?    1. Check the care team in [facility] and look for a)  attending/consulting TR[facility] provider listed and b) the [facility] team listed 2. Log into [email or web address] and use [facility]'s universal password to access. If you do not have the password, please contact the hospital operator. 3. Locate the [facility] provider you are looking for under [facility] and page to a number that you can be directly reached. 4. If you still have difficulty reaching the provider, please page the [facility] (Director on Call) for the Hospitalists listed on amion for assistance.

## 2020-09-13 NOTE — Progress Notes (Signed)
Unlabeled pill bottle found in the bed with patient. Bottle has assorted pills in it. Patient denies taking any medication other than what she has been given by the staff. Pill bottle was placed in drawer in the room away from the patient.

## 2020-09-13 NOTE — Progress Notes (Signed)
HFNC settings re-titrated due to 83% sat on previous settings.

## 2020-09-14 ENCOUNTER — Inpatient Hospital Stay: Payer: Medicare Other

## 2020-09-14 LAB — CBC
HCT: 34 % — ABNORMAL LOW (ref 36.0–46.0)
Hemoglobin: 10.8 g/dL — ABNORMAL LOW (ref 12.0–15.0)
MCH: 31.5 pg (ref 26.0–34.0)
MCHC: 31.8 g/dL (ref 30.0–36.0)
MCV: 99.1 fL (ref 80.0–100.0)
Platelets: 339 10*3/uL (ref 150–400)
RBC: 3.43 MIL/uL — ABNORMAL LOW (ref 3.87–5.11)
RDW: 14 % (ref 11.5–15.5)
WBC: 13.5 10*3/uL — ABNORMAL HIGH (ref 4.0–10.5)
nRBC: 0 % (ref 0.0–0.2)

## 2020-09-14 LAB — BASIC METABOLIC PANEL
Anion gap: 11 (ref 5–15)
BUN: 61 mg/dL — ABNORMAL HIGH (ref 8–23)
CO2: 31 mmol/L (ref 22–32)
Calcium: 10.4 mg/dL — ABNORMAL HIGH (ref 8.9–10.3)
Chloride: 96 mmol/L — ABNORMAL LOW (ref 98–111)
Creatinine, Ser: 1.47 mg/dL — ABNORMAL HIGH (ref 0.44–1.00)
GFR, Estimated: 36 mL/min — ABNORMAL LOW (ref >60)
Glucose, Bld: 94 mg/dL (ref 70–99)
Potassium: 3.8 mmol/L (ref 3.5–5.1)
Sodium: 138 mmol/L (ref 135–145)

## 2020-09-14 LAB — C-REACTIVE PROTEIN: CRP: 0.8 mg/dL (ref <1.0)

## 2020-09-14 LAB — BRAIN NATRIURETIC PEPTIDE: B Natriuretic Peptide: 71.8 pg/mL (ref 0.0–100.0)

## 2020-09-14 LAB — D-DIMER, QUANTITATIVE: D-Dimer, Quant: 0.3 ug/mL-FEU (ref 0.00–0.50)

## 2020-09-14 LAB — PROCALCITONIN: Procalcitonin: <0.1 ng/mL

## 2020-09-14 MED ORDER — HYDROCORTISONE (PERIANAL) 2.5 % EX CREA
TOPICAL_CREAM | Freq: Two times a day (BID) | CUTANEOUS | Status: DC
  Filled 2020-09-14: qty 28.35

## 2020-09-14 MED ORDER — DM-GUAIFENESIN ER 30-600 MG PO TB12
1.0000 | ORAL_TABLET | Freq: Two times a day (BID) | ORAL | Status: DC
  Administered 2020-09-14: 1 via ORAL
  Filled 2020-09-14: qty 1

## 2020-09-14 NOTE — Progress Notes (Signed)
Occupational Therapy Treatment Patient Details Name: [name] MRN: [id number] DOB: [birth year] Today's Date: 09/14/2020    History of present illness 80 y.o. female with medical history significant for atrial fibrillation on chronic anticoagulation therapy, history of CHF, hypertension who was brought into the ER by EMS for evaluation of shortness of breath and hypoxia.   OT comments  Pt seen for OT tx this date. Pt pleasant and eager to participate, denies SOB or pain. Pt endorses that she may have had a BM. Pt able to perform bed mobility with modified independence. Sat EOB for medication from RN. CGA for STS transfer and to allow OT ability to look for evidence of BM. Upon standing, pt noted to have had BM in mesh panties she had on. Pt able to doff panties over her hips in standing with CGA, requiring MIN A to thread over feet to maximize safety. Pt get to [facility] with STS and SPT with use of the RW and Sup-CGA for safety and line mgt. Pt had additional BM in [facility] and noted to have moderate amount of blood. Pt endorses hemorrhoids. RN and NT notified at end of session.  Pt performed several STS transfers with RW from [facility] to perform pericare in standing with CGA and set up, requiring MOD A for thoroughness to maximize skin integrity. Pt returned to bed and educated in repositioning to improve upright posture for lungs. Pt verbalized understanding and endorsed feeling more comfortable. Pt SpO2 remained >90% throughout, majority of time 92-95% on 45L HHFNC. Pt progressing to OT goals, however requiring significant supplemental O2. Will continue to benefit from skilled OT services to maximize return to PLOF.    Follow Up Recommendations  Home health OT;Supervision - Intermittent    Equipment Recommendations       Recommendations for Other Services      Precautions / Restrictions Precautions Precautions: Fall Restrictions Weight Bearing Restrictions: No Other Position/Activity Restrictions: RUE  rotator cuff injury limits weight bearing       Mobility Bed Mobility Overal bed mobility: Modified Independent             General bed mobility comments: Min extra time and effort only    Transfers Overall transfer level: Needs assistance Equipment used: Rolling walker (2 wheeled) Transfers: Sit to/from [employer] Sit to Stand: Min guard;Supervision Stand pivot transfers: Supervision;Min guard            Balance Overall balance assessment: Needs assistance Sitting-balance support: Feet supported;No upper extremity supported Sitting balance-Leahy Scale: Normal     Standing balance support: Bilateral upper extremity supported;During functional activity;Single extremity supported Standing balance-Leahy Scale: Good Standing balance comment: Pt able to use LUE support on RW while performing pericare with RUE in standing with no overt LOB                           ADL either performed or assessed with clinical judgement   ADL Overall ADL's : Needs assistance/impaired                     Lower Body Dressing: Sit to/from stand;Minimal assistance Lower Body Dressing Details (indicate cue type and reason): Min A to thread mesh panties over feet Toilet Transfer: RW;BSC;Stand-pivot;Min guard Toilet Transfer Details (indicate cue type and reason): CGA and assist for line mgt for STS and SPT to [facility] with RW Toileting- Clothing Manipulation and Hygiene: Sit to/from stand;Moderate assistance Toileting - Clothing Manipulation  Details (indicate cue type and reason): Pt able to perform pericare with set up but demo'd difficulty with thoroughness, requiring MOD A and use of cleansing spray and wipes to maximize cleanliness and skin integrity; OT noted moderate amount of bloody stool, notified RN and NT             Vision Patient Visual Report: No change from baseline     Perception     Praxis      Cognition Arousal/Alertness:  Awake/alert Behavior During Therapy: WFL for tasks assessed/performed Overall Cognitive Status: Within Functional Limits for tasks assessed                                 General Comments: pt pleasant, motivated and demo's good safety awareness        Exercises Other Exercises Other Exercises: Pt performed LB dressing to doff mesh panties in standing requiring MIN A to thread over feet in sitting to support safety; Performed several STS transfers with RW from [facility] to perform pericare in standing with CGA and set up, requiring MOD A for thoroughness   Shoulder Instructions       General Comments Pt on 45L HHFNC HR 90's-120's with exertion, RR 20's-30's, SpO2 >90% throughout    Pertinent Vitals/ Pain       Pain Assessment: No/denies pain  Home Living                                          Prior Functioning/Environment              Frequency  Min 2X/week        Progress Toward Goals  OT Goals(current goals can now be found in the care plan section)  Progress towards OT goals: Progressing toward goals  Acute Rehab OT Goals Patient Stated Goal: Go home OT Goal Formulation: With patient Time For Goal Achievement: 09/26/20 Potential to Achieve Goals: Good  Plan Discharge plan remains appropriate;Frequency remains appropriate    Co-evaluation                 AM-PAC OT "6 Clicks" Daily Activity     Outcome Measure   Help from another person eating meals?: None Help from another person taking care of personal grooming?: None Help from another person toileting, which includes using toliet, bedpan, or urinal?: A Lot Help from another person bathing (including washing, rinsing, drying)?: A Little Help from another person to put on and taking off regular upper body clothing?: A Little Help from another person to put on and taking off regular lower body clothing?: A Little 6 Click Score: 19    End of Session Equipment Utilized  During Treatment: Gait belt;Rolling walker;Oxygen  OT Visit Diagnosis: Other abnormalities of gait and mobility (R26.89);Muscle weakness (generalized) (M62.81)   Activity Tolerance Patient tolerated treatment well   Patient Left in bed;with call bell/phone within reach;with bed alarm set   Nurse Communication Mobility status;Other (comment) (bloody BM, O2 sats; notified RN and nurse tech)        Time: 6063-0160 OT Time Calculation (min): 43 min  Charges: OT General Charges $OT Visit: 1 Visit OT Treatments $Self Care/Home Management : 38-52 mins  Wynona Canes, MPH, MS, OTR/L ascom [phone] 09/14/20, 3:48 PM

## 2020-09-14 NOTE — TOC Progression Note (Signed)
Transition of Care [facility]) - Progression Note    Patient Details  Name: [name] MRN: [id number] Date of Birth: 1941/05/23  Transition of Care [facility]) CM/SW Contact  Hetty Ely, RN Phone Number: 09/14/2020, 3:36 PM  Clinical Narrative:  Attempted to call patient via in room phone, no answer, RN states patient is being evaluated by OT. Also called patient friend/POA Lestine Box no answer left voice message to return call. Will discuss discharge preference when return call.          Expected Discharge Plan and Services                                                 Social Determinants of Health (SDOH) Interventions    Readmission Risk Interventions Readmission Risk Prevention Plan 06/17/2019  Transportation Screening Complete  HRI or Home Care Consult Complete  Palliative Care Screening Not Complete  Medication Review (RN Care Manager) Complete  Some recent data might be hidden

## 2020-09-14 NOTE — Progress Notes (Signed)
Patient wanted to remove her earrings, they are in a basin and it is in the top drawer in the patient's room

## 2020-09-14 NOTE — Progress Notes (Addendum)
PROGRESS NOTE    [name]   [id number]  DOB: Nov 16, 1940  PCP: Danella PentonMiller, Mark F, MD    DOA: 09/06/2020 LOS: 8   Brief Narrative   [name] is a 80 y.o. female with medical history significant for atrial fibrillation on chronic anticoagulation therapy, history of CHF, hypertension who was brought into the ER by EMS for evaluation of shortness of breath and hypoxia.  She was found to have pneumonia due to Covid-19, admitted to hospital for further management.  Has had significant oxygen requirements on heated HFNC at 45 L/min 70% FiO2.     Assessment & Plan   Principal Problem:   Pneumonia due to COVID-19 virus Active Problems:   Atrial fibrillation (HCC)   Anxiety   GERD (gastroesophageal reflux disease)   Acute on chronic diastolic CHF (congestive heart failure) (HCC)   Acute CHF (congestive heart failure) (HCC)   Acute respiratory failure due to COVID-19 (HCC)   Anemia of chronic disease   Frequent falls   Acute respiratory failure with hypoxia due to COVID-19 multifocal pneumonia -  Patient has received 3 doses of COVID-19 vaccine. Chest x-ray showed multifocal pneumonia and SPO2 on arrival was 45% on room air. She was started on remdesivir and IV Solu-Medrol. Continues to have significant requirement for supplemental oxygen, on heated HFNC at 45 L/min. -- Completed remdesivir 4/21 -- Continue prednisone 50 mg daily, initially IV Solu-medrol -- Supplemental oxygen as needed to maintain sat greater than 90%, wean as tolerated -- Supportive care: Bronchodilators, antitussives, incentive spirometer  Hypercalcemia - Ca 10.4 this AM (4/25).  Hold Os-cal for now and monitor.  PTH is pending.  Chest pain - suspect esophageal spasm / upper GI source.  ECG with nonspecific T wave changes and troponin normal x2.  Monitor.  Acute sinusitis / Congestion - with hx of recurrent sinus infections, facial pain/pressure and tenderness, significant thick post-nasal  drainage. --Started doxycycline 4/23 (pcn allergic), treat 3-5 days --Continue azelastine spray, Flonase, loratadine  Chronic diastolic CHF -appears euvolemic and well compensated at this time. --resumed PO Lasix 20 mg BID --Continue monitor renal function and electrolytes -- Monitor volume status: daily weights  Acute kidney injury -POA with creatinine 1.96.   Cr has ranged from 1.2-1.5 -- Resumed Lasix, initially held -- Monitor BMP daily  Frequent falls -2 falls within the past month reported, sustained injury to her right shoulder rotator cuff. -- Fall precautions -- PT/OT  Hypokalemia -replaced and resolved. -- Monitor BMP, Mg and further replacement as indicated  History of paroxysmal A. Fib -rate controlled. -- Continue flecainide, verapamil, Eliquis -- Telemetry monitoring -- EKG pending, monitor QTc  Essential hypertension -BP was low on admission but has trended up and currently at goal. -- Continue verapamil, metoprolol -- Hold Lasix given bump in creatinine 4/20 -- Hold HCTZ  Hx of recurrent sinusitis - pt does have some congestion, suspect due to heated HF oxygen. 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 --topical acyclovir ordered  Restless leg syndrome -patient reports exacerbation due to being in the bed so much. -- Continue pramipexole (dose was increased from 0.25  >> 0.5mg  QID)  History of depression and anxiety -continue trazodone, alprazolam, fluoxetine  GERD -continue Protonix, Carafate  Anemia of chronic disease -hemoglobin stable.  No evidence of bleeding. --Monitor CBC  Allergic rhinitis -continue Flonase, Claritin, Singulair,  azelastine nasal spray  History of muscle spasms -continue as needed Zanaflex  Continued on home meds: vitamin B12, Tegretol, vitamin C, multivitamins, oxybutynin,  estradiol, colchicine   Patient BMI: Body mass index is 28.08 kg/m.   DVT prophylaxis:  apixaban (ELIQUIS) tablet 5 mg   Diet:  Diet Orders (From admission, onward)    Start     Ordered   09/06/20 0902  Diet 2 gram sodium Room service appropriate? Yes; Fluid consistency: Thin  Diet effective now       Question Answer Comment  Room service appropriate? Yes   Fluid consistency: Thin      09/06/20 0903            Code Status: Full Code    Subjective 09/14/20    Patient feeling awful today, and tearful at times during encounter.  Frustrated not getting better more quickly.  Reports difficulty voiding today.  Feels need to void buy unable.  Says se finally was able to empty bladder shortly before encounter mid-day.  No fever/chills.  Congestion little better.  Disposition Plan & Communication   Status is: Inpatient  Remains inpatient appropriate because:Inpatient level of care appropriate due to severity of illness -patient COVID-19 pneumonia continues to have significant requirements for supplemental oxygen, on heated high flow.   Dispo: The patient is from: Home              Anticipated d/c is to: Home              Patient currently is not medically stable to d/c.   Difficult to place patient No   Consults, Procedures, Significant Events   Consultants:   None  Procedures:   None  Antimicrobials:  Anti-infectives (From admission, onward)   Start     Dose/Rate Route Frequency Ordered Stop   09/11/20 2200  doxycycline (VIBRA-TABS) tablet 100 mg        100 mg Oral Every 12 hours 09/11/20 1924     09/07/20 1000  remdesivir 100 mg in sodium chloride 0.9 % 100 mL IVPB       "Followed by" Linked Group Details   100 mg 200 mL/hr over 30 Minutes Intravenous Daily 09/06/20 0515 09/10/20 0945   09/07/20 1000  remdesivir 100 mg in sodium chloride 0.9 % 100 mL IVPB  Status:  Discontinued       "Followed by" Linked Group Details   100 mg 200 mL/hr over 30 Minutes  Intravenous Daily 09/06/20 0903 09/06/20 0904   09/06/20 0915  remdesivir 200 mg in sodium chloride 0.9% 250 mL IVPB  Status:  Discontinued       "Followed by" Linked Group Details   200 mg 580 mL/hr over 30 Minutes Intravenous Once 09/06/20 0903 09/06/20 0904   09/06/20 0600  remdesivir 200 mg in sodium chloride 0.9% 250 mL IVPB       "Followed by" Linked Group Details   200 mg 580 mL/hr over 30 Minutes Intravenous Once 09/06/20 0515 09/06/20 0928        Micro    Objective   Vitals:   09/14/20 0900 09/14/20 1136 09/14/20 1402 09/14/20 1711  BP:  139/78  127/61  Pulse:  92  93  Resp:  14  19  Temp:  97.8 F (36.6 C)  98 F (36.7 C)  TempSrc:  Oral  Oral  SpO2: 93% 93% 92% 92%  Weight:      Height:        Intake/Output Summary (Last 24  hours) at 09/14/2020 1955 Last data filed at 09/14/2020 1700 Gross per 24 hour  Intake 483 ml  Output 1700 ml  Net -1217 ml   Filed Weights   09/12/20 0454 09/13/20 0454 09/14/20 0519  Weight: 78.2 kg 80.8 kg 78.9 kg    Physical Exam:  General exam: awake, alert, no acute distress, sitting up in bed Respiratory system: decreased breath sounds, no wheezes or rhonchi, normal respiratory effort. On heated HFNC oxygen 45 L/min Cardiovascular system: RRR, no pedal edema.   Central nervous system: A&O x3. Grossly nonfocal exam. Normal speech Psychiatry: depressed mood (tearful at times), congruent affect, normal judgment and insight  Labs   Data Reviewed: I have personally reviewed following labs and imaging studies  CBC: Recent Labs  Lab 09/10/20 0532 09/11/20 0435 09/12/20 0516 09/13/20 0430 09/14/20 0541  WBC 10.4 10.5 13.0* 14.1* 13.5*  HGB 11.0* 10.5* 11.6* 10.9* 10.8*  HCT 33.4* 32.8* 35.2* 34.1* 34.0*  MCV 97.9 99.4 97.5 99.4 99.1  PLT 303 318 355 334 339   Basic Metabolic Panel: Recent Labs  Lab 09/08/20 0626 09/09/20 0518 09/10/20 0532 09/11/20 0435 09/12/20 0516 09/13/20 0430 09/14/20 0541  NA 143 141 138  140 139 137 138  K 3.0* 3.4* 2.7* 3.6 4.3 3.9 3.8  CL 97* 95* 94* 98 95* 96* 96*  CO2 32 33* 32 30 30 29 31   GLUCOSE 158* 143* 103* 149* 106* 109* 94  BUN 50* 66* 66* 63* 68* 64* 61*  CREATININE 1.22* 1.57* 1.35* 1.32* 1.51* 1.34* 1.47*  CALCIUM 9.9 10.0 9.5 9.3 10.0 9.7 10.4*  MG 1.8 1.8 1.6* 1.9  --   --   --    GFR: Estimated Creatinine Clearance: 33.4 mL/min (A) (by C-G formula based on SCr of 1.47 mg/dL (H)). Liver Function Tests: Recent Labs  Lab 09/10/20 0532 09/11/20 0435 09/12/20 0516  AST 21 20 18   ALT 20 18 19   ALKPHOS 59 65 65  BILITOT 0.9 1.0 1.0  PROT 5.9* 5.7* 6.1*  ALBUMIN 2.7* 2.7* 2.9*   No results for input(s): LIPASE, AMYLASE in the last 168 hours. No results for input(s): AMMONIA in the last 168 hours. Coagulation Profile: No results for input(s): INR, PROTIME in the last 168 hours. Cardiac Enzymes: No results for input(s): CKTOTAL, CKMB, CKMBINDEX, TROPONINI in the last 168 hours. BNP (last 3 results) No results for input(s): PROBNP in the last 8760 hours. HbA1C: No results for input(s): HGBA1C in the last 72 hours. CBG: No results for input(s): GLUCAP in the last 168 hours. Lipid Profile: No results for input(s): CHOL, HDL, LDLCALC, TRIG, CHOLHDL, LDLDIRECT in the last 72 hours. Thyroid Function Tests: No results for input(s): TSH, T4TOTAL, FREET4, T3FREE, THYROIDAB in the last 72 hours. Anemia Panel: No results for input(s): VITAMINB12, FOLATE, FERRITIN, TIBC, IRON, RETICCTPCT in the last 72 hours. Sepsis Labs: Recent Labs  Lab 09/08/20 0626 09/14/20 0847  PROCALCITON <0.10 <0.10    Recent Results (from the past 240 hour(s))  Resp Panel by RT-PCR (Flu A&B, Covid) Nasopharyngeal Swab     Status: Abnormal   Collection Time: 09/06/20  4:08 AM   Specimen: Nasopharyngeal Swab; Nasopharyngeal(NP) swabs in vial transport medium  Result Value Ref Range Status   SARS Coronavirus 2 by RT PCR POSITIVE (A) NEGATIVE Final    Comment: RESULT CALLED  TO, READ BACK BY AND VERIFIED WITH: NOTIFIED JESSICA HUDSON RN 09/06/2020 0510 JG (NOTE) SARS-CoV-2 target nucleic acids are DETECTED.  The SARS-CoV-2 RNA is generally detectable  in upper respiratory specimens during the acute phase of infection. Positive results are indicative of the presence of the identified virus, but do not rule out bacterial infection or co-infection with other pathogens not detected by the test. Clinical correlation with patient history and other diagnostic information is necessary to determine patient infection status. The expected result is Negative.  Fact Sheet for Patients: BloggerCourse.com  Fact Sheet for Healthcare Providers: SeriousBroker.it  This test is not yet approved or cleared by the [place] FDA and  has been authorized for detection and/or diagnosis of SARS-CoV-2 by FDA under an Emergency Use Authorization (EUA).  This EUA will remain in effect (meaning this  test can be used) for the duration of  the COVID-19 declaration under Section 564(b)(1) of the Act, 21 U.S.C. section 360bbb-3(b)(1), unless the authorization is terminated or revoked sooner.     Influenza A by PCR NEGATIVE NEGATIVE Final   Influenza B by PCR NEGATIVE NEGATIVE Final    Comment: (NOTE) The Xpert Xpress SARS-CoV-2/FLU/RSV plus assay is intended as an aid in the diagnosis of influenza from Nasopharyngeal swab specimens and should not be used as a sole basis for treatment. Nasal washings and aspirates are unacceptable for Xpert Xpress SARS-CoV-2/FLU/RSV testing.  Fact Sheet for Patients: BloggerCourse.com  Fact Sheet for Healthcare Providers: SeriousBroker.it  This test is not yet approved or cleared by the [place] FDA and has been authorized for detection and/or diagnosis of SARS-CoV-2 by FDA under an Emergency Use Authorization (EUA). This EUA will  remain in effect (meaning this test can be used) for the duration of the COVID-19 declaration under Section 564(b)(1) of the Act, 21 U.S.C. section 360bbb-3(b)(1), unless the authorization is terminated or revoked.  Performed at [facility], [address], [place], [place] [address]       Imaging Studies   DG Chest [place] 1 View  Result Date: 09/14/2020 CLINICAL DATA:  COVID-19.  Shortness of breath. EXAM: PORTABLE CHEST 1 VIEW COMPARISON:  September 06, 2020. FINDINGS: Stable cardiomegaly. No pneumothorax is noted. Decreased bibasilar opacities are noted suggesting improving atelectasis or edema. Stable large hiatal hernia. Bony thorax is unremarkable. IMPRESSION: Decreased bibasilar opacities are noted as described above. Electronically Signed   By: Lupita Raider M.D.   On: 09/14/2020 08:57     Medications   Scheduled Meds: . acyclovir ointment   Topical Q3H  . albuterol  2 puff Inhalation Q6H  . apixaban  5 mg Oral BID  . vitamin C  1,000 mg Oral Daily  . azelastine  1 spray Each Nare Daily  . carbamazepine  100 mg Oral BID  . dextromethorphan-guaiFENesin  1 tablet Oral BID  . doxycycline  100 mg Oral Q12H  . estradiol  1.5 mg Oral Daily  . flecainide  50 mg Oral Q12H  . fluticasone  2 spray Each Nare BID  . fluticasone  1 puff Inhalation BID  . furosemide  20 mg Oral BID  . hydrocortisone   Rectal BID  . loratadine  10 mg Oral Daily  . magic mouthwash  15 mL Oral TID  . magnesium gluconate  500 mg Oral BID  . montelukast  10 mg Oral QHS  . multivitamin-lutein  1 capsule Oral Daily  . oxybutynin  5 mg Oral QPM  . pantoprazole  40 mg Oral Daily  . pramipexole  0.5 mg Oral QID  . predniSONE  50 mg Oral Daily  . sodium chloride flush  3 mL Intravenous Q12H  .  sucralfate  1 g Oral TID  . verapamil  180 mg Oral Daily  . vitamin B-12  1,000 mcg Oral QHS   Continuous Infusions: . sodium chloride 250 mL (09/10/20 0914)       LOS: 8 days    Time spent: 25  minutes with > 50% spent at bedside and in coordination of care.    Pennie Banter, DO [facility]  09/14/2020, 7:55 PM      If 7PM-7AM, please contact night-coverage. How to contact the [facility] Attending or Consulting provider 7A - 7P or covering provider during after hours 7P -7A, for this patient?    1. Check the care team in [facility] and look for a) attending/consulting [facility] provider listed and b) the [facility] team listed 2. Log into [email or web address] and use [facility] universal password to access. If you do not have the password, please contact the hospital operator. 3. Locate the [facility] provider you are looking for under [facility] and page to a number that you can be directly reached. 4. If you still have difficulty reaching the provider, please page the [facility] (Director on Call) for the Hospitalists listed on amion for assistance.

## 2020-09-15 ENCOUNTER — Inpatient Hospital Stay
Admission: AD | Admit: 2020-09-15 | Discharge: 2020-09-21 | Disposition: A | Discharge status: Home / Self Care | Payer: PRIVATE HEALTH INSURANCE | Source: Other Acute Inpatient Hospital | Attending: Internal Medicine | Admitting: Internal Medicine

## 2020-09-15 LAB — BASIC METABOLIC PANEL
Anion gap: 12 (ref 5–15)
BUN: 59 mg/dL — ABNORMAL HIGH (ref 8–23)
CO2: 30 mmol/L (ref 22–32)
Calcium: 9.7 mg/dL (ref 8.9–10.3)
Chloride: 95 mmol/L — ABNORMAL LOW (ref 98–111)
Creatinine, Ser: 1.38 mg/dL — ABNORMAL HIGH (ref 0.44–1.00)
GFR, Estimated: 39 mL/min — ABNORMAL LOW (ref >60)
Glucose, Bld: 113 mg/dL — ABNORMAL HIGH (ref 70–99)
Potassium: 3.7 mmol/L (ref 3.5–5.1)
Sodium: 137 mmol/L (ref 135–145)

## 2020-09-15 LAB — C-REACTIVE PROTEIN: CRP: 1 mg/dL — ABNORMAL HIGH (ref <1.0)

## 2020-09-15 LAB — PARATHYROID HORMONE, INTACT (NO CA): PTH: 37 pg/mL (ref 15–65)

## 2020-09-15 MED ORDER — PREDNISONE 50 MG PO TABS: ORAL_TABLET | ORAL | Status: AC

## 2020-09-15 MED ORDER — METOPROLOL TARTRATE 25 MG PO TABS: 12.5000 mg | ORAL_TABLET | Freq: Every day | ORAL | Status: AC | PRN

## 2020-09-15 MED ORDER — FLUTICASONE PROPIONATE HFA 220 MCG/ACT IN AERO: 1.0000 | INHALATION_SPRAY | Freq: Two times a day (BID) | RESPIRATORY_TRACT | 12 refills | Status: AC

## 2020-09-15 MED ORDER — TIZANIDINE HCL 2 MG PO TABS: 2.0000 mg | ORAL_TABLET | Freq: Three times a day (TID) | ORAL | 0 refills | Status: AC | PRN

## 2020-09-15 MED ORDER — MONTELUKAST SODIUM 10 MG PO TABS: 10.0000 mg | ORAL_TABLET | Freq: Every day | ORAL | Status: AC

## 2020-09-15 MED ORDER — MAGIC MOUTHWASH: 15.0000 mL | Freq: Three times a day (TID) | ORAL | 0 refills | Status: AC

## 2020-09-15 MED ORDER — ALBUTEROL SULFATE HFA 108 (90 BASE) MCG/ACT IN AERS: 2.0000 | INHALATION_SPRAY | Freq: Four times a day (QID) | RESPIRATORY_TRACT | Status: AC

## 2020-09-15 MED ORDER — OCUVITE-LUTEIN PO CAPS: 1.0000 | ORAL_CAPSULE | Freq: Every day | ORAL | 0 refills | Status: AC

## 2020-09-15 MED ORDER — HYDROCORTISONE (PERIANAL) 2.5 % EX CREA: TOPICAL_CREAM | Freq: Two times a day (BID) | CUTANEOUS | 0 refills | Status: AC

## 2020-09-15 MED ORDER — DOXYCYCLINE HYCLATE 100 MG PO TABS: 100.0000 mg | ORAL_TABLET | Freq: Two times a day (BID) | ORAL | 0 refills | Status: AC

## 2020-09-15 MED ORDER — ACETAMINOPHEN 500 MG PO TABS: 1000.0000 mg | ORAL_TABLET | Freq: Three times a day (TID) | ORAL | 0 refills | Status: AC | PRN

## 2020-09-15 MED ORDER — ACYCLOVIR 5 % EX OINT: TOPICAL_OINTMENT | CUTANEOUS | Status: AC

## 2020-09-15 MED ORDER — GUAIFENESIN-DM 100-10 MG/5ML PO SYRP: 10.0000 mL | ORAL_SOLUTION | ORAL | 0 refills | Status: AC | PRN

## 2020-09-15 MED ORDER — FLECAINIDE ACETATE 50 MG PO TABS: 50.0000 mg | ORAL_TABLET | Freq: Two times a day (BID) | ORAL | Status: AC

## 2020-09-15 MED ORDER — MAGNESIUM GLUCONATE 500 MG PO TABS: 500.0000 mg | ORAL_TABLET | Freq: Two times a day (BID) | ORAL | Status: AC

## 2020-09-15 MED ORDER — BENZOCAINE 10 % MT GEL: Freq: Four times a day (QID) | OROMUCOSAL | 0 refills | Status: AC | PRN

## 2020-09-15 MED ORDER — PRAMIPEXOLE DIHYDROCHLORIDE 0.5 MG PO TABS: 0.5000 mg | ORAL_TABLET | Freq: Four times a day (QID) | ORAL | Status: AC

## 2020-09-15 MED ORDER — FUROSEMIDE 20 MG PO TABS: 20.0000 mg | ORAL_TABLET | Freq: Two times a day (BID) | ORAL | Status: AC

## 2020-09-15 MED ORDER — SALINE SPRAY 0.65 % NA SOLN: 1.0000 | NASAL | 0 refills | Status: AC | PRN

## 2020-09-15 NOTE — Progress Notes (Signed)
Report called to [facility] in [place] to [place], Charity fundraiser. [facility] will be picking pt up approx [phone]. Discharge paperwork printed and will be sent with transport.

## 2020-09-15 NOTE — Discharge Summary (Signed)
Physician Discharge Summary  [name]:[id number] DOB: 1941/04/15 DOA: 09/06/2020  PCP: Danella Penton, MD  Admit date: 09/06/2020 Discharge date: 09/15/2020  Admitted From: home Disposition:  LTACH  Recommendations for Outpatient Follow-up:  1. Follow up with PCP within 1 week of d/c from [facility] 2. Please obtain BMP/CBC in one week  Home Health: n/a  Equipment/Devices: none   Discharge Condition: stable  CODE STATUS: full  Diet recommendation: Low sodium (2g/day)   Discharge Diagnoses: Principal Problem:   Pneumonia due to COVID-19 virus Active Problems:   Atrial fibrillation (HCC)   Anxiety   GERD (gastroesophageal reflux disease)   Acute on chronic diastolic CHF (congestive heart failure) (HCC)   Acute CHF (congestive heart failure) (HCC)   Acute respiratory failure due to COVID-19 (HCC)   Anemia of chronic disease   Frequent falls    Summary of HPI and Hospital Course:  [name] is a 80 y.o. female with medical history significant for atrial fibrillation on chronic anticoagulation therapy, history of CHF, hypertension who was brought into the ER by EMS for evaluation of shortness of breath and hypoxia.  She was found to have pneumonia due to Covid-19, admitted to hospital for further management.  4/20-26: Continues to have significant oxygen requirements on heated HFNC at 45 L/min, although FiO2 has come down from 70% to 30-35%.    Patient is otherwise medically stable, aside from persistently high oxygen requirements.  She is appropriate for [facility] and medically stable for transfer there today.  Discussed with patient and her POA, and they are agreeable.        Acute respiratory failure with hypoxia due to COVID-19 multifocal pneumonia -  Patient has received 3 doses of COVID-19 vaccine. Chest x-ray showed multifocal pneumonia and SPO2 on arrival was 45% on room air. She was started on remdesivir and IV Solu-Medrol. Continues to have significant requirement for  supplemental oxygen, on heated HFNC at 45 L/min. -- Completed remdesivir 4/21 -- Continue prednisone 50 mg daily, initially on IV Solu-medrol -- Supplemental oxygen as needed to maintain sat greater than 90%, wean as tolerated -- Supportive care: Bronchodilators, antitussives, incentive spirometer  Hypercalcemia - Ca 10.4 on 4/25.  Resolved.    PTH is pending.  Chest pain - suspect esophageal spasm / upper GI source.   ECG with nonspecific T wave changes and troponin normal x2.  Monitor. 4/26 - no recurrence since single episode  Acute sinusitis / Congestion - with hx of recurrent sinus infections, facial pain/pressure and tenderness, significant thick post-nasal drainage. --Started doxycycline 4/23 (pcn allergic), treat 3-5 days --Continue azelastine spray, Flonase, loratadine  Chronic diastolic CHF -appears euvolemic and well compensated at this time. -- resumed PO Lasix 20 mg BID -- Continue monitor renal function and electrolytes -- Monitor volume status: daily weights  Acute kidney injury -POA with creatinine 1.96.   Cr has ranged from 1.2-1.5 -- Resumed Lasix, initially held -- Monitor BMP daily  Frequent falls - 2 falls within the past month reported,  Right shoulder rotator cuff strain/tear due to above.  Surgery considered but delayed by this acute illness. -- Fall precautions -- Continue PT/OT at [facility]  Hypokalemia -replaced and resolved. -- Obtain BMP in 1 week, sooner if clinical concern for electrolyte derangements or worsened renal function  History of paroxysmal A. Fib -rate controlled. -- Continue flecainide, verapamil, Eliquis -- Follow up with cardiology as scheduled  Essential hypertension -BP was low on admission but has trended up and currently  at goal. -- Continue verapamil, metoprolol, Lasix -- Hold HCTZ, consider resuming if BP elevated  Hx of recurrent sinusitis - pt does have some congestion, suspect due to heated HF oxygen. 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 --topical acyclovir   Restless leg syndrome -patient reports exacerbation due to being in the bed so much. -- Continue pramipexole (dose was increased from 0.25  >> 0.5mg  QID)  History of depression and anxiety -continue trazodone, alprazolam, fluoxetine  GERD -continue Protonix, Carafate  Anemia of chronic disease -hemoglobin stable.  No evidence of bleeding. --Monitor CBC  Allergic rhinitis -continue Flonase, Claritin, Singulair, azelastine nasal spray  History of muscle spasms -continue as needed Zanaflex  Continued on home meds: vitamin B12, Tegretol, vitamin C, multivitamins, oxybutynin, estradiol, colchicine     Discharge Instructions   Discharge Instructions    (HEART FAILURE PATIENTS) Call MD:  Anytime you have any of the following symptoms: 1) 3 pound weight gain in 24 hours or 5 pounds in 1 week 2) shortness of breath, with or without a dry hacking cough 3) swelling in the hands, feet or stomach 4) if you have to sleep on extra pillows at night in order to breathe.   Complete by: As directed    Call MD for:   Complete by: As directed    Worsening shortness of breath or increasing oxygen requirements to keep O2 sat above 88%   Call MD for:  extreme fatigue   Complete by: As directed    Call MD for:  persistant dizziness or light-headedness   Complete by: As directed    Call MD for:  persistant nausea and vomiting   Complete by: As directed    Call MD for:  severe uncontrolled pain   Complete by: As directed    Call MD for:  temperature >100.4   Complete by: As directed    Diet - low sodium heart healthy   Complete by: As directed    Discharge wound care:   Complete by: As directed    Monitor area, cleanse daily, cover with dressing for comfort or cleanliness   Increase activity slowly   Complete by: As directed      Allergies as of  09/15/2020      Reactions   Penicillins Hives   Did it involve swelling of the face/tongue/throat, SOB, or low BP? NO Did it involve sudden or severe rash/hives, skin peeling, or any reaction on the inside of your mouth or nose? Yes. Pt reports severe hives (blistering) from neck to her stomach Did you need to seek medical attention at a hospital or doctor's office? Unknown When did it last happen?>36 years ago If all above answers are "NO", may proceed with cephalosporin use.   Amlodipine Rash   Clarithromycin Rash   Minocycline Other (See Comments)   Onset 04/10/2001.  / unknown reaction   Propoxyphene Hives   Onset 04/10/2001.   Ace Inhibitors Cough   Norvasc [amlodipine Besylate] Hives   Vicodin [hydrocodone-acetaminophen] Hives   Hives occur after 5 doses   Betadine [povidone Iodine] Rash, Other (See Comments)   blisters   Iodine Rash   Sulfa Antibiotics Diarrhea, Nausea Only      Medication List    STOP taking these medications   budesonide 1 MG/2ML nebulizer solution Commonly known as: PULMICORT   Calcium Carb-Cholecalciferol 600-400 MG-UNIT Tabs   celecoxib 100 MG capsule Commonly known  as: CELEBREX   ciprofloxacin 500 MG tablet Commonly known as: CIPRO   hydrochlorothiazide 25 MG tablet Commonly known as: HYDRODIURIL   Magnesium 500 MG Caps Replaced by: magnesium gluconate 500 MG tablet     TAKE these medications   acetaminophen 500 MG tablet Commonly known as: TYLENOL Take 2 tablets (1,000 mg total) by mouth 3 (three) times daily as needed for mild pain, moderate pain or headache.   acyclovir ointment 5 % Commonly known as: ZOVIRAX Apply topically every 3 (three) hours.   albuterol 108 (90 Base) MCG/ACT inhaler Commonly known as: VENTOLIN HFA Inhale 2 puffs into the lungs every 6 (six) hours. What changed:   how much to take  when to take this  reasons to take this  Another medication with the same name was removed. Continue taking this  medication, and follow the directions you see here.   ALPRAZolam 0.25 MG tablet Commonly known as: XANAX Take 0.25 mg by mouth 3 (three) times daily as needed for anxiety.   azelastine 0.1 % nasal spray Commonly known as: ASTELIN Place 1 spray into both nostrils daily. Use in each nostril as directed   benzocaine 10 % mucosal gel Commonly known as: ORAJEL Use as directed in the mouth or throat 4 (four) times daily as needed for mouth pain.   carbamazepine 100 MG 12 hr tablet Commonly known as: TEGRETOL XR Take 100 mg by mouth 2 (two) times daily.   Cetirizine HCl 10 MG Caps Take 10 mg by mouth daily.   colchicine 0.6 MG tablet Take 1 tablet (0.6 mg total) by mouth daily as needed (gout flare).   doxycycline 100 MG tablet Commonly known as: VIBRA-TABS Take 1 tablet (100 mg total) by mouth every 12 (twelve) hours for 3 days. What changed: when to take this   Eliquis 5 MG Tabs tablet Generic drug: apixaban TAKE 1 TABLET(5 MG) BY MOUTH TWICE DAILY What changed: See the new instructions.   esomeprazole 20 MG capsule Commonly known as: NEXIUM Take 20 mg by mouth daily before breakfast.   estradiol 1 MG tablet Commonly known as: ESTRACE Take 1.5 mg by mouth daily.   flecainide 50 MG tablet Commonly known as: TAMBOCOR Take 1 tablet (50 mg total) by mouth every 12 (twelve) hours. What changed:   medication strength  how much to take  when to take this   FLUoxetine 20 MG capsule Commonly known as: PROZAC Take 20 mg by mouth daily.   fluticasone 220 MCG/ACT inhaler Commonly known as: FLOVENT HFA Inhale 1 puff into the lungs 2 (two) times daily.   fluticasone 50 MCG/ACT nasal spray Commonly known as: FLONASE Place 2 sprays into both nostrils 2 (two) times daily.   furosemide 20 MG tablet Commonly known as: LASIX Take 1 tablet (20 mg total) by mouth 2 (two) times daily. What changed:   medication strength  how much to take  when to take this    guaiFENesin-dextromethorphan 100-10 MG/5ML syrup Commonly known as: ROBITUSSIN DM Take 10 mLs by mouth every 4 (four) hours as needed for cough.   hydrocortisone 2.5 % rectal cream Commonly known as: ANUSOL-HC Place rectally 2 (two) times daily.   magic mouthwash Soln Take 15 mLs by mouth 3 (three) times daily.   magnesium gluconate 500 MG tablet Commonly known as: MAGONATE Take 1 tablet (500 mg total) by mouth 2 (two) times daily. Replaces: Magnesium 500 MG Caps   metoprolol tartrate 25 MG tablet Commonly known as: LOPRESSOR Take 0.5  tablets (12.5 mg total) by mouth daily as needed (fast HR (over 110 beat/min)). What changed:   how much to take  reasons to take this  additional instructions   montelukast 10 MG tablet Commonly known as: SINGULAIR Take 1 tablet (10 mg total) by mouth at bedtime. What changed: See the new instructions.   multivitamin with minerals Tabs tablet Take 1 tablet by mouth daily with supper.   multivitamin-lutein Caps capsule Take 1 capsule by mouth daily. Start taking on: September 16, 2020 What changed:   how much to take  when to take this   ondansetron 4 MG tablet Commonly known as: ZOFRAN Take 4 mg by mouth every 6 (six) hours as needed for nausea or vomiting.   oxybutynin 5 MG tablet Commonly known as: DITROPAN Take 5 mg by mouth every evening.   pramipexole 0.5 MG tablet Commonly known as: MIRAPEX Take 1 tablet (0.5 mg total) by mouth 4 (four) times daily. What changed:   how much to take  when to take this  additional instructions   predniSONE 50 MG tablet Commonly known as: DELTASONE Take 1 tablet (50 mg) by mouth once daily with breakfast Start taking on: September 16, 2020 What changed:   medication strength  See the new instructions.   sodium chloride 0.65 % Soln nasal spray Commonly known as: OCEAN Place 1 spray into both nostrils as needed for congestion.   sucralfate 1 g tablet Commonly known as:  CARAFATE Take 1 g by mouth 3 (three) times daily.   SUMAtriptan 50 MG tablet Commonly known as: IMITREX Take 50 mg by mouth daily as needed for migraine.   tiZANidine 2 MG tablet Commonly known as: ZANAFLEX Take 1 tablet (2 mg total) by mouth 3 (three) times daily as needed for muscle spasms. What changed: medication strength   traMADol 50 MG tablet Commonly known as: ULTRAM Take 50 mg by mouth every 6 (six) hours as needed for moderate pain.   traZODone 100 MG tablet Commonly known as: DESYREL Take 0.5 tablets (50 mg total) by mouth at bedtime as needed for sleep.   verapamil 180 MG 24 hr capsule Commonly known as: VERELAN PM Take 180 mg by mouth in the morning and at bedtime.   vitamin B-12 1000 MCG tablet Commonly known as: CYANOCOBALAMIN Take 1,000 mcg by mouth at bedtime.   vitamin C 1000 MG tablet Take 1,000 mg by mouth daily.            Discharge Care Instructions  (From admission, onward)         Start     Ordered   09/15/20 0000  Discharge wound care:       Comments: Monitor area, cleanse daily, cover with dressing for comfort or cleanliness   09/15/20 1432          Allergies  Allergen Reactions  . Penicillins Hives    Did it involve swelling of the face/tongue/throat, SOB, or low BP? NO Did it involve sudden or severe rash/hives, skin peeling, or any reaction on the inside of your mouth or nose? Yes. Pt reports severe hives (blistering) from neck to her stomach Did you need to seek medical attention at a hospital or doctor's office? Unknown When did it last happen?>36 years ago If all above answers are "NO", may proceed with cephalosporin use.  . Amlodipine Rash  . Clarithromycin Rash  . Minocycline Other (See Comments)    Onset 04/10/2001.  / unknown reaction  . Propoxyphene Hives  Onset 04/10/2001.   . Ace Inhibitors Cough  . Norvasc [Amlodipine Besylate] Hives  . Vicodin [Hydrocodone-Acetaminophen] Hives    Hives occur after 5  doses   . Betadine [Povidone Iodine] Rash and Other (See Comments)    blisters  . Iodine Rash  . Sulfa Antibiotics Diarrhea and Nausea Only     If you experience worsening of your admission symptoms, develop shortness of breath, life threatening emergency, suicidal or homicidal thoughts you must seek medical attention immediately by calling 911 or calling your MD immediately  if symptoms less severe.    Please note   You were cared for by a hospitalist during your hospital stay. If you have any questions about your discharge medications or the care you received while you were in the hospital after you are discharged, you can call the unit and asked to speak with the hospitalist on call if the hospitalist that took care of you is not available. Once you are discharged, your primary care physician will handle any further medical issues. Please note that NO REFILLS for any discharge medications will be authorized once you are discharged, as it is imperative that you return to your primary care physician (or establish a relationship with a primary care physician if you do not have one) for your aftercare needs so that they can reassess your need for medications and monitor your lab values.   Consultations:  none    Procedures/Studies: MR SHOULDER RIGHT WO CONTRAST  Result Date: 09/03/2020 CLINICAL DATA:  Right shoulder pain with decreased range of motion EXAM: MRI OF THE RIGHT SHOULDER WITHOUT CONTRAST TECHNIQUE: Multiplanar, multisequence MR imaging of the shoulder was performed. No intravenous contrast was administered. COMPARISON:  None. FINDINGS: Rotator cuff: Complete full-thickness tears of the supraspinatus and subscapularis tendons with retraction to the level of the glenohumeral joint. Infraspinatus tendon is partially torn and the intact portion is significantly attenuated. Intact teres minor. Muscles: Supraspinatus and subscapularis muscle atrophy. Subscapularis greater than  supraspinatus intramuscular edema. Biceps long head: Long head biceps tendon is dislocated into the anterior aspect of the glenohumeral joint but remains intact. Acromioclavicular Joint: Mild arthropathy of the AC joint. Trace subacromial-subdeltoid bursal fluid. Glenohumeral Joint: Mild diffuse chondral thinning. No joint effusion. Labrum: Appears degenerated, although evaluation is limited by motion degradation and lack of intra-articular fluid/contrast. Bones: No acute fracture. No dislocation. No suspicious bone lesion. Other: Multiple foci of susceptibility overlie the superolateral aspect of the shoulder compatible with history of prior surgery. IMPRESSION: 1. Complete full-thickness retracted tears of the supraspinatus and subscapularis tendons with associated muscle atrophy. 2. Tendinosis and high-grade partial-thickness tearing of the infraspinatus tendon. 3. Dislocation of the long head biceps tendon into the anterior aspect of the glenohumeral joint but remains intact. 4. Mild AC and glenohumeral osteoarthritis. Electronically Signed   By: Duanne Guess D.O.   On: 09/03/2020 09:03   US Venous Img Lower Bilateral (DVT)  Result Date: 09/06/2020 CLINICAL DATA:  Initial evaluation for bilateral lower extremity swelling. EXAM: BILATERAL LOWER EXTREMITY VENOUS DOPPLER ULTRASOUND TECHNIQUE: Gray-scale sonography with graded compression, as well as color Doppler and duplex ultrasound were performed to evaluate the lower extremity deep venous systems from the level of the common femoral vein and including the common femoral, femoral, profunda femoral, popliteal and calf veins including the posterior tibial, peroneal and gastrocnemius veins when visible. The superficial great saphenous vein was also interrogated. Spectral Doppler was utilized to evaluate flow at rest and with distal augmentation maneuvers in the common  femoral, femoral and popliteal veins. COMPARISON:  None. FINDINGS: RIGHT LOWER EXTREMITY  Common Femoral Vein: No evidence of thrombus. Normal compressibility, respiratory phasicity and response to augmentation. Saphenofemoral Junction: No evidence of thrombus. Normal compressibility and flow on color Doppler imaging. Profunda Femoral Vein: No evidence of thrombus. Normal compressibility and flow on color Doppler imaging. Femoral Vein: No evidence of thrombus. Normal compressibility, respiratory phasicity and response to augmentation. Popliteal Vein: No evidence of thrombus. Normal compressibility, respiratory phasicity and response to augmentation. Calf Veins: No evidence of thrombus. Normal compressibility and flow on color Doppler imaging. Superficial Great Saphenous Vein: No evidence of thrombus. Normal compressibility. Venous Reflux:  None. Other Findings: Mild diffuse soft tissue/interstitial edema within the calf. LEFT LOWER EXTREMITY Common Femoral Vein: No evidence of thrombus. Normal compressibility, respiratory phasicity and response to augmentation. Saphenofemoral Junction: No evidence of thrombus. Normal compressibility and flow on color Doppler imaging. Profunda Femoral Vein: No evidence of thrombus. Normal compressibility and flow on color Doppler imaging. Femoral Vein: No evidence of thrombus. Normal compressibility, respiratory phasicity and response to augmentation. Popliteal Vein: No evidence of thrombus. Normal compressibility, respiratory phasicity and response to augmentation. Calf Veins: No evidence of thrombus. Normal compressibility and flow on color Doppler imaging. Superficial Great Saphenous Vein: No evidence of thrombus. Normal compressibility. Venous Reflux:  None. Other Findings: Mild soft tissue/interstitial edema within the calf. IMPRESSION: 1. No evidence of deep venous thrombosis in either lower extremity. 2. Mild diffuse soft tissue/interstitial edema within the lower calves bilaterally. Electronically Signed   By: Rise MuBenjamin  McClintock M.D.   On: 09/06/2020 04:50    DG Chest Port 1 View  Result Date: 09/14/2020 CLINICAL DATA:  COVID-19.  Shortness of breath. EXAM: PORTABLE CHEST 1 VIEW COMPARISON:  September 06, 2020. FINDINGS: Stable cardiomegaly. No pneumothorax is noted. Decreased bibasilar opacities are noted suggesting improving atelectasis or edema. Stable large hiatal hernia. Bony thorax is unremarkable. IMPRESSION: Decreased bibasilar opacities are noted as described above. Electronically Signed   By: Lupita RaiderJames  Green Jr M.D.   On: 09/14/2020 08:57   DG Chest Port 1 View  Result Date: 09/06/2020 CLINICAL DATA:  On and off shortness of breath EXAM: PORTABLE CHEST 1 VIEW COMPARISON:  03/12/2020 FINDINGS: Patchy bilateral pulmonary opacity. Cardiomegaly accentuated by large hiatal hernia. No visible effusion or pneumothorax. IMPRESSION: 1. Patchy pulmonary opacity suggesting multifocal pneumonia. 2. Cardiomegaly without typical signs of failure. Electronically Signed   By: Marnee SpringJonathon  Watts M.D.   On: 09/06/2020 05:27         Subjective: Pt feels a little down about being "stuck", feels like not getting better, been the same for past several days.  Denies any worsening SOB.  Still very congested, upper denture blisters painful, mouth feels dry.  We discussed LTACH and she was agreeable if PT/OT will be continued there.  No fever/chills, N/V or other complaints today.   Discharge Exam: Vitals:   09/15/20 0851 09/15/20 1236  BP: 129/70 (!) 145/79  Pulse:  (!) 102  Resp:  20  Temp:  97.9 F (36.6 C)  SpO2:  91%   Vitals:   09/15/20 0834 09/15/20 0835 09/15/20 0851 09/15/20 1236  BP: 129/70 129/70 129/70 (!) 145/79  Pulse: 74   (!) 102  Resp: (!) 21 20  20   Temp:  97.9 F (36.6 C)  97.9 F (36.6 C)  TempSrc:  Oral  Oral  SpO2: 92% 92%  91%  Weight:      Height:  General: Pt is alert, awake, not in acute distress Cardiovascular: RRR, S1/S2 +, no rubs, no gallops Respiratory: clear bilaterally but diminished bases due to shallow  inspirations, no wheezing, no rhonchi, of heated HFNC at 45 L/min 35% FiO2 Abdominal: Soft, NT, ND, bowel sounds + Extremities: no edema, no cyanosis    The results of significant diagnostics from this hospitalization (including imaging, microbiology, ancillary and laboratory) are listed below for reference.     Microbiology: Recent Results (from the past 240 hour(s))  Resp Panel by RT-PCR (Flu A&B, Covid) Nasopharyngeal Swab     Status: Abnormal   Collection Time: 09/06/20  4:08 AM   Specimen: Nasopharyngeal Swab; Nasopharyngeal(NP) swabs in vial transport medium  Result Value Ref Range Status   SARS Coronavirus 2 by RT PCR POSITIVE (A) NEGATIVE Final    Comment: RESULT CALLED TO, READ BACK BY AND VERIFIED WITH: NOTIFIED JESSICA HUDSON RN 09/06/2020 0510 JG (NOTE) SARS-CoV-2 target nucleic acids are DETECTED.  The SARS-CoV-2 RNA is generally detectable in upper respiratory specimens during the acute phase of infection. Positive results are indicative of the presence of the identified virus, but do not rule out bacterial infection or co-infection with other pathogens not detected by the test. Clinical correlation with patient history and other diagnostic information is necessary to determine patient infection status. The expected result is Negative.  Fact Sheet for Patients: BloggerCourse.com  Fact Sheet for Healthcare Providers: SeriousBroker.it  This test is not yet approved or cleared by the [place] FDA and  has been authorized for detection and/or diagnosis of SARS-CoV-2 by FDA under an Emergency Use Authorization (EUA).  This EUA will remain in effect (meaning this  test can be used) for the duration of  the COVID-19 declaration under Section 564(b)(1) of the Act, 21 U.S.C. section 360bbb-3(b)(1), unless the authorization is terminated or revoked sooner.     Influenza A by PCR NEGATIVE NEGATIVE Final   Influenza  B by PCR NEGATIVE NEGATIVE Final    Comment: (NOTE) The Xpert Xpress SARS-CoV-2/FLU/RSV plus assay is intended as an aid in the diagnosis of influenza from Nasopharyngeal swab specimens and should not be used as a sole basis for treatment. Nasal washings and aspirates are unacceptable for Xpert Xpress SARS-CoV-2/FLU/RSV testing.  Fact Sheet for Patients: BloggerCourse.com  Fact Sheet for Healthcare Providers: SeriousBroker.it  This test is not yet approved or cleared by the [place] FDA and has been authorized for detection and/or diagnosis of SARS-CoV-2 by FDA under an Emergency Use Authorization (EUA). This EUA will remain in effect (meaning this test can be used) for the duration of the COVID-19 declaration under Section 564(b)(1) of the Act, 21 U.S.C. section 360bbb-3(b)(1), unless the authorization is terminated or revoked.  Performed at [facility], [address] [place], [place] [address]      Labs: BNP (last 3 results) Recent Labs    03/12/20 1659 09/06/20 0408 09/14/20 0847  BNP 909.5* 1,287.6* 71.8   Basic Metabolic Panel: Recent Labs  Lab 09/09/20 0518 09/10/20 0532 09/11/20 0435 09/12/20 0516 09/13/20 0430 09/14/20 0541 09/15/20 0510  NA 141 138 140 139 137 138 137  K 3.4* 2.7* 3.6 4.3 3.9 3.8 3.7  CL 95* 94* 98 95* 96* 96* 95*  CO2 33* 32 30 30 29 31 30   GLUCOSE 143* 103* 149* 106* 109* 94 113*  BUN 66* 66* 63* 68* 64* 61* 59*  CREATININE 1.57* 1.35* 1.32* 1.51* 1.34* 1.47* 1.38*  CALCIUM 10.0 9.5 9.3 10.0 9.7 10.4*  9.7  MG 1.8 1.6* 1.9  --   --   --   --    Liver Function Tests: Recent Labs  Lab 09/10/20 0532 09/11/20 0435 09/12/20 0516  AST 21 20 18   ALT 20 18 19   ALKPHOS 59 65 65  BILITOT 0.9 1.0 1.0  PROT 5.9* 5.7* 6.1*  ALBUMIN 2.7* 2.7* 2.9*   No results for input(s): LIPASE, AMYLASE in the last 168 hours. No results for input(s): AMMONIA in the last 168  hours. CBC: Recent Labs  Lab 09/10/20 0532 09/11/20 0435 09/12/20 0516 09/13/20 0430 09/14/20 0541  WBC 10.4 10.5 13.0* 14.1* 13.5*  HGB 11.0* 10.5* 11.6* 10.9* 10.8*  HCT 33.4* 32.8* 35.2* 34.1* 34.0*  MCV 97.9 99.4 97.5 99.4 99.1  PLT 303 318 355 334 339   Cardiac Enzymes: No results for input(s): CKTOTAL, CKMB, CKMBINDEX, TROPONINI in the last 168 hours. BNP: Invalid input(s): POCBNP CBG: No results for input(s): GLUCAP in the last 168 hours. D-Dimer Recent Labs    09/14/20 0847  DDIMER 0.30   Hgb A1c No results for input(s): HGBA1C in the last 72 hours. Lipid Profile No results for input(s): CHOL, HDL, LDLCALC, TRIG, CHOLHDL, LDLDIRECT in the last 72 hours. Thyroid function studies No results for input(s): TSH, T4TOTAL, T3FREE, THYROIDAB in the last 72 hours.  Invalid input(s): FREET3 Anemia work up No results for input(s): VITAMINB12, FOLATE, FERRITIN, TIBC, IRON, RETICCTPCT in the last 72 hours. Urinalysis    Component Value Date/Time   COLORURINE YELLOW (A) 03/12/2020 2152   APPEARANCEUR CLEAR (A) 03/12/2020 2152   APPEARANCEUR Clear 04/24/2013 0749   LABSPEC 1.013 03/12/2020 2152   LABSPEC 1.006 04/24/2013 0749   PHURINE 5.0 03/12/2020 2152   GLUCOSEU NEGATIVE 03/12/2020 2152   GLUCOSEU Negative 04/24/2013 0749   HGBUR NEGATIVE 03/12/2020 2152   BILIRUBINUR NEGATIVE 03/12/2020 2152   BILIRUBINUR Negative 04/24/2013 0749   KETONESUR NEGATIVE 03/12/2020 2152   PROTEINUR NEGATIVE 03/12/2020 2152   UROBILINOGEN 0.2 08/20/2010 0521   NITRITE NEGATIVE 03/12/2020 2152   LEUKOCYTESUR NEGATIVE 03/12/2020 2152   LEUKOCYTESUR Negative 04/24/2013 0749   Sepsis Labs Invalid input(s): PROCALCITONIN,  WBC,  LACTICIDVEN Microbiology Recent Results (from the past 240 hour(s))  Resp Panel by RT-PCR (Flu A&B, Covid) Nasopharyngeal Swab     Status: Abnormal   Collection Time: 09/06/20  4:08 AM   Specimen: Nasopharyngeal Swab; Nasopharyngeal(NP) swabs in vial  transport medium  Result Value Ref Range Status   SARS Coronavirus 2 by RT PCR POSITIVE (A) NEGATIVE Final    Comment: RESULT CALLED TO, READ BACK BY AND VERIFIED WITH: NOTIFIED JESSICA HUDSON RN 09/06/2020 0510 JG (NOTE) SARS-CoV-2 target nucleic acids are DETECTED.  The SARS-CoV-2 RNA is generally detectable in upper respiratory specimens during the acute phase of infection. Positive results are indicative of the presence of the identified virus, but do not rule out bacterial infection or co-infection with other pathogens not detected by the test. Clinical correlation with patient history and other diagnostic information is necessary to determine patient infection status. The expected result is Negative.  Fact Sheet for Patients: 09/08/20  Fact Sheet for Healthcare Providers: 09/08/2020  This test is not yet approved or cleared by the [employer] FDA and  has been authorized for detection and/or diagnosis of SARS-CoV-2 by FDA under an Emergency Use Authorization (EUA).  This EUA will remain in effect (meaning this  test can be used) for the duration of  the COVID-19 declaration under Section 564(b)(1) of the Act,  21 U.S.C. section 360bbb-3(b)(1), unless the authorization is terminated or revoked sooner.     Influenza A by PCR NEGATIVE NEGATIVE Final   Influenza B by PCR NEGATIVE NEGATIVE Final    Comment: (NOTE) The Xpert Xpress SARS-CoV-2/FLU/RSV plus assay is intended as an aid in the diagnosis of influenza from Nasopharyngeal swab specimens and should not be used as a sole basis for treatment. Nasal washings and aspirates are unacceptable for Xpert Xpress SARS-CoV-2/FLU/RSV testing.  Fact Sheet for Patients: [employer]  Fact Sheet for Healthcare Providers: SeriousBroker.it  This test is not yet approved or cleared by the [place] FDA  and has been authorized for detection and/or diagnosis of SARS-CoV-2 by FDA under an Emergency Use Authorization (EUA). This EUA will remain in effect (meaning this test can be used) for the duration of the COVID-19 declaration under Section 564(b)(1) of the Act, 21 U.S.C. section 360bbb-3(b)(1), unless the authorization is terminated or revoked.  Performed at [facility], [address] [place], [place] [address]      Time coordinating discharge: Over 30 minutes  SIGNED:   Pennie Banter, DO [facility] 09/15/2020, 2:37 PM   If 7PM-7AM, please contact night-coverage [email or web address]

## 2020-09-15 NOTE — Progress Notes (Signed)
Physical Therapy Treatment Patient Details Name: [name] MRN: [id number] DOB: 06-17-1940 Today's Date: 09/15/2020    History of Present Illness 80 y.o. female with medical history significant for atrial fibrillation on chronic anticoagulation therapy, history of CHF, hypertension who was brought into the ER by EMS for evaluation of shortness of breath and hypoxia.    PT Comments    Pt was pleasant and motivated to participate during the session and put forth good effort throughout.  Pt required no physical assistance during the session and was able to amb 15 feet with SpO2 dropping to a low of 95% on 45L HHFNC and with HR in the 120s.  Pt reported no adverse symptoms during the session other than mild dizziness upon coming to standing that resolved very quickly.  Pt will benefit from HHPT upon discharge to safely address deficits listed in patient problem list for decreased caregiver assistance and eventual return to PLOF.     Follow Up Recommendations  Home health PT;Supervision for mobility/OOB     Equipment Recommendations  Rolling walker with 5" wheels    Recommendations for Other Services       Precautions / Restrictions Precautions Precautions: Fall Restrictions Weight Bearing Restrictions: No Other Position/Activity Restrictions: RUE rotator cuff injury limits weight bearing    Mobility  Bed Mobility Overal bed mobility: Modified Independent             General bed mobility comments: Min extra time and effort only    Transfers Overall transfer level: Needs assistance Equipment used: Rolling walker (2 wheeled) Transfers: Sit to/from Stand Sit to Stand: Min guard;Supervision         General transfer comment: Good eccentric and concentric control and stability  Ambulation/Gait Ambulation/Gait assistance: Min guard Gait Distance (Feet): 15 Feet x 1, 5 Feet x 1 Assistive device: Rolling walker (2 wheeled) Gait Pattern/deviations: Step-through  pattern;Decreased step length - right;Decreased step length - left Gait velocity: decreased   General Gait Details: Slow cadence but steady without LOB with SpO2 >/= 95% throughout the session and HR up to a max of 120's.   Stairs             Wheelchair Mobility    Modified Rankin (Stroke Patients Only)       Balance Overall balance assessment: Needs assistance Sitting-balance support: Feet supported;No upper extremity supported Sitting balance-Leahy Scale: Normal     Standing balance support: Bilateral upper extremity supported;During functional activity Standing balance-Leahy Scale: Good                              Cognition Arousal/Alertness: Awake/alert Behavior During Therapy: WFL for tasks assessed/performed Overall Cognitive Status: Within Functional Limits for tasks assessed                                        Exercises Total Joint Exercises Ankle Circles/Pumps: AROM;Strengthening;Both;10 reps Quad Sets: Strengthening;Both;10 reps Gluteal Sets: Strengthening;Both;10 reps Heel Slides: Strengthening;Both;10 reps Hip ABduction/ADduction: Strengthening;Both;10 reps Straight Leg Raises: Strengthening;10 reps;Left Long Arc Quad: Strengthening;Both;10 reps Knee Flexion: 10 reps;Both;Strengthening Marching in Standing: AROM;Strengthening;Both;5 reps;Standing Other Exercises Other Exercises: Standing low amplitude mini squats x 10    General Comments        Pertinent Vitals/Pain Pain Assessment: No/denies pain    Home Living  Prior Function            PT Goals (current goals can now be found in the care plan section) Progress towards PT goals: Progressing toward goals    Frequency    Min 2X/week      PT Plan Current plan remains appropriate    Co-evaluation              AM-PAC PT "6 Clicks" Mobility   Outcome Measure  Help needed turning from your back to your side  while in a flat bed without using bedrails?: None Help needed moving from lying on your back to sitting on the side of a flat bed without using bedrails?: None Help needed moving to and from a bed to a chair (including a wheelchair)?: A Little Help needed standing up from a chair using your arms (e.g., wheelchair or bedside chair)?: A Little Help needed to walk in hospital room?: A Little Help needed climbing 3-5 steps with a railing? : A Lot 6 Click Score: 19    End of Session Equipment Utilized During Treatment: Gait belt;Oxygen Activity Tolerance: Patient tolerated treatment well Patient left: with chair alarm set;with call bell/phone within reach;in chair Nurse Communication: Mobility status PT Visit Diagnosis: Muscle weakness (generalized) (M62.81);Difficulty in walking, not elsewhere classified (R26.2)     Time: 3220-2542 PT Time Calculation (min) (ACUTE ONLY): 47 min  Charges:  $Gait Training: 8-22 mins $Therapeutic Exercise: 8-22 mins $Therapeutic Activity: 8-22 mins                     D. Scott Jakeira Seeman PT, DPT 09/15/20, 1:27 PM

## 2020-09-16 LAB — BASIC METABOLIC PANEL
Anion gap: 11 (ref 5–15)
BUN: 46 mg/dL — ABNORMAL HIGH (ref 8–23)
CO2: 31 mmol/L (ref 22–32)
Calcium: 9.7 mg/dL (ref 8.9–10.3)
Chloride: 97 mmol/L — ABNORMAL LOW (ref 98–111)
Creatinine, Ser: 1.31 mg/dL — ABNORMAL HIGH (ref 0.44–1.00)
GFR, Estimated: 42 mL/min — ABNORMAL LOW (ref >60)
Glucose, Bld: 83 mg/dL (ref 70–99)
Potassium: 3.8 mmol/L (ref 3.5–5.1)
Sodium: 139 mmol/L (ref 135–145)

## 2020-09-16 LAB — CBC
HCT: 37.2 % (ref 36.0–46.0)
Hemoglobin: 11.8 g/dL — ABNORMAL LOW (ref 12.0–15.0)
MCH: 32 pg (ref 26.0–34.0)
MCHC: 31.7 g/dL (ref 30.0–36.0)
MCV: 100.8 fL — ABNORMAL HIGH (ref 80.0–100.0)
Platelets: 328 10*3/uL (ref 150–400)
RBC: 3.69 MIL/uL — ABNORMAL LOW (ref 3.87–5.11)
RDW: 13.8 % (ref 11.5–15.5)
WBC: 11.9 10*3/uL — ABNORMAL HIGH (ref 4.0–10.5)
nRBC: 0 % (ref 0.0–0.2)

## 2020-09-20 LAB — MAGNESIUM: Magnesium: 2.2 mg/dL (ref 1.7–2.4)

## 2020-09-20 LAB — BASIC METABOLIC PANEL
Anion gap: 13 (ref 5–15)
BUN: 40 mg/dL — ABNORMAL HIGH (ref 8–23)
CO2: 28 mmol/L (ref 22–32)
Calcium: 10.1 mg/dL (ref 8.9–10.3)
Chloride: 97 mmol/L — ABNORMAL LOW (ref 98–111)
Creatinine, Ser: 1.39 mg/dL — ABNORMAL HIGH (ref 0.44–1.00)
GFR, Estimated: 39 mL/min — ABNORMAL LOW (ref >60)
Glucose, Bld: 115 mg/dL — ABNORMAL HIGH (ref 70–99)
Potassium: 3.6 mmol/L (ref 3.5–5.1)
Sodium: 138 mmol/L (ref 135–145)

## 2020-09-28 ENCOUNTER — Other Ambulatory Visit: Payer: Self-pay

## 2020-09-28 ENCOUNTER — Emergency Department
Admission: EM | Admit: 2020-09-28 | Discharge: 2020-09-28 | Disposition: A | Discharge status: Home / Self Care | Payer: Medicare Other | Attending: Emergency Medicine | Admitting: Emergency Medicine

## 2020-09-28 DIAGNOSIS — W182XXA Fall in (into) shower or empty bathtub, initial encounter: Secondary | ICD-10-CM | POA: Insufficient documentation

## 2020-09-28 DIAGNOSIS — Z7901 Long term (current) use of anticoagulants: Secondary | ICD-10-CM | POA: Diagnosis not present

## 2020-09-28 DIAGNOSIS — I110 Hypertensive heart disease with heart failure: Secondary | ICD-10-CM | POA: Diagnosis not present

## 2020-09-28 DIAGNOSIS — I5033 Acute on chronic diastolic (congestive) heart failure: Secondary | ICD-10-CM | POA: Diagnosis not present

## 2020-09-28 DIAGNOSIS — Z8616 Personal history of COVID-19: Secondary | ICD-10-CM | POA: Insufficient documentation

## 2020-09-28 DIAGNOSIS — Y92002 Bathroom of unspecified non-institutional (private) residence single-family (private) house as the place of occurrence of the external cause: Secondary | ICD-10-CM | POA: Insufficient documentation

## 2020-09-28 DIAGNOSIS — S81812A Laceration without foreign body, left lower leg, initial encounter: Secondary | ICD-10-CM | POA: Diagnosis not present

## 2020-09-28 DIAGNOSIS — S8992XA Unspecified injury of left lower leg, initial encounter: Secondary | ICD-10-CM | POA: Diagnosis present

## 2020-09-28 MED ORDER — SULFAMETHOXAZOLE-TRIMETHOPRIM 400-80 MG PO TABS: 1.0000 | ORAL_TABLET | Freq: Two times a day (BID) | ORAL | 0 refills | Status: AC

## 2020-09-28 MED ORDER — LIDOCAINE-EPINEPHRINE 2 %-1:100000 IJ SOLN
20.0000 mL | Freq: Once | INTRAMUSCULAR | Status: AC
  Administered 2020-09-28: 20 mL via INTRADERMAL
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
## 2020-09-28 NOTE — ED Triage Notes (Addendum)
Pt comes with c/o left leg pain following a fall. POA with pt and states that pt slipped in bath tub earlier this am. Pt has skin tears to left leg. Pt is on blood thinners.  Pt denies hitting head. Pt is A&OX4.

## 2020-09-28 NOTE — Discharge Instructions (Signed)
Please call and schedule an appointment with the wound care center.  Follow up with Dr. Hyacinth Meeker as scheduled.  Return to the ER for concerns if unable to see primary care or wound care.

## 2020-09-28 NOTE — Progress Notes (Signed)
Contacted by EDP regarding laceration to leg. Reviewed images in chart.  I do not see any viable manner in which the skin could be re-approximated for coverage of the site.  Recommend local wound care; no role for general surgery.  May need referral to wound care center for more in-depth management.

## 2020-09-28 NOTE — ED Notes (Signed)
Pt states that approx an hour ago she and her helper were attempting to get her into the tub and her knees buckled under her, pt has a lac to her left lower leg, pt states that it looks like raw hamburger. Pt states she is in the process of rebuilding her strength from a recent admission from Covid. Pt reports that she wears O2 at night now due to covid.

## 2020-10-01 ENCOUNTER — Encounter: Payer: Medicare Other | Attending: Physician Assistant | Admitting: Physician Assistant

## 2020-10-01 DIAGNOSIS — I872 Venous insufficiency (chronic) (peripheral): Secondary | ICD-10-CM | POA: Diagnosis present

## 2020-10-01 DIAGNOSIS — I110 Hypertensive heart disease with heart failure: Secondary | ICD-10-CM | POA: Insufficient documentation

## 2020-10-01 DIAGNOSIS — W2203XA Walked into furniture, initial encounter: Secondary | ICD-10-CM | POA: Diagnosis not present

## 2020-10-01 DIAGNOSIS — Z885 Allergy status to narcotic agent status: Secondary | ICD-10-CM | POA: Diagnosis not present

## 2020-10-01 DIAGNOSIS — Z882 Allergy status to sulfonamides status: Secondary | ICD-10-CM | POA: Insufficient documentation

## 2020-10-01 DIAGNOSIS — S81802A Unspecified open wound, left lower leg, initial encounter: Secondary | ICD-10-CM | POA: Diagnosis not present

## 2020-10-01 DIAGNOSIS — S61501A Unspecified open wound of right wrist, initial encounter: Secondary | ICD-10-CM | POA: Diagnosis not present

## 2020-10-01 DIAGNOSIS — Z7901 Long term (current) use of anticoagulants: Secondary | ICD-10-CM | POA: Insufficient documentation

## 2020-10-01 DIAGNOSIS — I480 Paroxysmal atrial fibrillation: Secondary | ICD-10-CM | POA: Insufficient documentation

## 2020-10-01 DIAGNOSIS — Z888 Allergy status to other drugs, medicaments and biological substances status: Secondary | ICD-10-CM | POA: Insufficient documentation

## 2020-10-01 DIAGNOSIS — L97825 Non-pressure chronic ulcer of other part of left lower leg with muscle involvement without evidence of necrosis: Secondary | ICD-10-CM | POA: Insufficient documentation

## 2020-10-01 DIAGNOSIS — I5042 Chronic combined systolic (congestive) and diastolic (congestive) heart failure: Secondary | ICD-10-CM | POA: Diagnosis not present

## 2020-10-01 DIAGNOSIS — Z880 Allergy status to penicillin: Secondary | ICD-10-CM | POA: Diagnosis not present

## 2020-10-02 NOTE — Progress Notes (Signed)
Sanson, [name]. ([id number]) Visit Report for 10/01/2020 Chief Complaint Document Details Patient Name: [name], [name]. Date of Service: 10/01/2020 2:30 PM Medical Record Number: [id number] Patient Account Number: [id number] Date of Birth/Sex: July 17, 1940 (80 y.o. F) Treating RN: Rogers Blocker Primary Care Provider: [name] Other Clinician: Referring Provider: [name] Treating Provider/Extender: Rowan Blase in Treatment: 0 Information Obtained from: Patient Chief Complaint Left LE ulcer, left knee ulcer, and right knee ulcer Electronic Signature(s) Signed: 10/01/2020 3:34:32 PM By: Lenda Kelp PA-C Entered By: Lenda Kelp on 10/01/2020 15:34:32 [name], [name]. ([id number]) -------------------------------------------------------------------------------- Debridement Details Patient Name: [name]. Date of Service: 10/01/2020 2:30 PM Medical Record Number: [id number] Patient Account Number: [id number] Date of Birth/Sex: 1941-05-22 (80 y.o. F) Treating RN: Rogers Blocker Primary Care Provider: [name] Other Clinician: Referring Provider: [name] Treating Provider/Extender: Rowan Blase in Treatment: 0 Debridement Performed for Wound #3 Left,Midline Lower Leg Assessment: Performed By: Physician Nelida Meuse., PA-C Debridement Type: Debridement Level of Consciousness (Pre- Awake and Alert procedure): Pre-procedure Verification/Time Out Yes - 15:57 Taken: Start Time: 15:57 Total Area Debrided (L x W): 14 (cm) x 5 (cm) = 70 (cm) Tissue and other material Non-Viable, Blood Clots, Fat, Subcutaneous, Skin: Dermis , Skin: Epidermis debrided: Level: Skin/Subcutaneous Tissue Debridement Description: Excisional Instrument: Curette, Forceps, Scissors Bleeding: Moderate Hemostasis Achieved: Pressure Response to Treatment: Procedure was tolerated well Level of Consciousness (Post- Awake and Alert procedure): Post Debridement Measurements of Total  Wound Length: (cm) 14 Width: (cm) 10 Depth: (cm) 0.6 Volume: (cm) 65.973 Character of Wound/Ulcer Post Debridement: Stable Post Procedure Diagnosis Same as Pre-procedure Electronic Signature(s) Signed: 10/01/2020 6:23:39 PM By: Lenda Kelp PA-C Signed: 10/02/2020 11:53:36 AM By: Lajean Manes RN Previous Signature: 10/01/2020 5:06:40 PM Version By: Phillis Haggis, Dondra Prader RN Entered By: Lenda Kelp on 10/01/2020 18:23:38 [name], [name]. ([id number]) -------------------------------------------------------------------------------- HPI Details Patient Name: [name]. Date of Service: 10/01/2020 2:30 PM Medical Record Number: [id number] Patient Account Number: [id number] Date of Birth/Sex: 07-30-1940 (80 y.o. F) Treating RN: Rogers Blocker Primary Care Provider: [name] Other Clinician: Referring Provider: [name] Treating Provider/Extender: Rowan Blase in Treatment: 0 History of Present Illness HPI Description: 06/07/2019 upon evaluation today patient presents today for initial evaluation in our clinic concerning issues she is having with her left lower extremity. She tells me that she hit her leg on a rocking chair when she was getting up and this was roughly at the beginning of December. Dr. Hyacinth Meeker who is her primary care provider has been helping with treating this area since that time. The patient states unfortunately this just is not getting any better. He is attempted salt water soaks as well as using antibiotic ointments topically unfortunately she has not noted much improvement. He wanted her to come to the wound center sooner but she declined although when things did not seem to be getting better she finally consented to this. Currently the patient does appear to have cellulitis of left lower extremity she also has atrial fibrillation noted for which she is on Eliquis this is probably part of the reason that she developed a large hematoma that she did.  Subsequently she also has hypertension as well as congestive heart failure. The patient does not appear to be febrile today and really is doing fairly well as far as that is concerned although the leg is warm to touch pretty much throughout. No fevers, chills, nausea, vomiting, or diarrhea. She has not been  on any oral antibiotics. 06/28/2019 patient actually is seen today in follow-up for her lower extremity ulcer. She is status post having actually spent 9 days in the hospital following when I sent her over for evaluation. They did have to keep her in the hospital and treat her with IV vancomycin secondary to the fact that she unfortunately had a bacteria present that obviously can be managed with oral medication with her penicillin allergy. Nonetheless her leg appears to be doing tremendously better today. There is no signs of active infection and overall very pleased with how things are progressing. I do believe that the wound is also measuring better along with looking much less irritated overall I think she is going to do quite well at this point. 07/05/2019 on evaluation today patient actually appears to be doing quite well with regard to her wound which is measuring smaller and still appears to be doing tremendously better compared to prior evaluation. I do believe the Melburn Popper has been of benefit for her. 07/12/19 patient's wound actually appears to be doing excellent at this time in regard to the left posterior lower extremity. She has been tolerating the dressing changes without complication. Fortunately there is much improvement noted here there is some slough noted she has been using Santyl I think that still appropriate. With that being said I am going to go ahead and perform sharp debridement today to clear away some of the necrotic material as well. 07/19/2019 upon evaluation today patient actually appears to be doing quite well in regard to her leg ulcer. She has been using Santyl and  has been doing excellent with this up to this point. However I think based on what I am seeing today we can likely proceed to doing something else, collagen, to help with getting this wound to heal more effectively and quickly. 07/26/2019 upon evaluation today patient appears to be doing excellent in regard to her lower extremity ulcer. She has been tolerating the dressing changes without complication. Fortunately the wound is continuing to measure smaller and does seem to be progressing quite nicely. I am very pleased with how things are going at this point. The patient likewise is extremely happy. 08/02/2019 upon evaluation today patient appears to be healing quite nicely and is making excellent progress at this point. Fortunately there is no evidence of active infection at this time which is good news. No fever chills noted 08/09/2019 on evaluation today patient's wound actually is continuing to show signs of excellent improvement. Is measuring much smaller this week even compared to last week and overall I am extremely pleased with the progress. Fortunately there is no evidence of active infection at this time. 08/23/2019 upon evaluation today patient appears to be doing well with regard to her original wound she is showing some signs of improvement here which is great news. Fortunately there is no signs of active infection at this time. No fevers, chills, nausea, vomiting, or diarrhea. 08/30/2019 upon evaluation today patient actually appears to be completely healed. She has little bit of dry skin at the location where the wound was but overall she has done extremely well. There is no signs of active infection at this time which is great news. Readmission: 11/22/2019 upon evaluation today patient appears to be doing somewhat poorly in regard to her leg ulcer. She tells me unfortunately that she fell out of bed about 1 month ago and subsequently has been monitoring this since she initially had a hematoma  and it sounds  like they were trying to get this to reabsorb Dr. Hyacinth MeekerMiller that is her primary care provider. With that being said her podiatrist apparently tried to open this up according to what the patient tells me today and then subsequently gave her an antibiotic for her toe and said that would also help with the area on her leg. They were not able to get anything to come out as her podiatrist stated that this seemed to be too thick in order to drain. With that being said the patient was eventually referred back here to the wound center for further management. 11/29/2019 on evaluation today patient appears to be doing a little bit more poorly today regarding her wound as compared to her last visit here. Again this is due to blistering on the edges of the wound which is very superficial but nonetheless new since last time. This is likely due to the fact that she did have to take off her compression wrap as she was having significant discomfort due to gout that flared up in her foot. Nonetheless she was seen by her primary care provider where she was given a shot of steroids and has new medication for gout that she is now taking. She still having discomfort however she states she does not believe she is good to be able to wear the wrap. 12/19/2019 on evaluation today patient actually appears to be making excellent progress in regard to her wound. She has been tolerating the dressing changes without complication. Fortunately there is no signs of active infection at this time. 12/27/2019 on evaluation today patient appears to be doing well with regard to her wound. This is measuring significantly smaller overall feel like Lachman, Delesha [name]. ([id number]) the silver cell has done excellent for her. Fortunately there is no signs of active infection at this time. 01/03/2020 upon evaluation today patient's wound actually is very close to complete resolution. She in fact is measuring significantly smaller and there does  not appear to be any evidence of significant drainage really I feel like this will be healed by next week. 01/10/2020 on evaluation today patient actually appears to be doing quite well in regard to her wound in fact this appears to be completely healed and I am very pleased that she has done so well. Fortunately there is no signs of active infection at this time. Readmission: 10/01/2020 upon evaluation today patient presents for reevaluation here in our clinic due to a very significant skin tear that occurred when she sustained a fall about 1 week ago. Unfortunately there really was not much done from the standpoint of clearing away some of the flap or even helping to pull this over and reapproximated in the emergency department based on what I see. It appears that the surgeon was consulted that would be Dr. Lady Garyannon and apparently it was believed that the best thing would be for them to follow-up here at the wound care center. With that being said we never received a call from the emergency department although it stated in the note that they contacted us but were unable to get in touch with anyone. Subsequently the patient states that they left the ER immediately following and contacted us and Velna HatchetSheila picked up immediately and got them on the schedule. Unfortunately I really feel like that if we had known the severity of the injury this may been something we should have try to get her in sooner for. Nonetheless she did see her primary care provider fortunately Dr. Hyacinth MeekerMiller  did take her off of the blood thinner, Eliquis, she does have a significant hematoma on top of a very severe skin tear here. He also placed her on Keflex as apparently the ER placed her on Bactrim which she is allergic to. Dr. Hyacinth Meeker recognize this and before she got the medication did switch it which is all some. With all that being said the patient unfortunately is having some discomfort and it does appear to be doing somewhat poorly  although she is in good spirits she is happy to be here she has had good results here at the wound care center before and she trust [place] which I do appreciate. With that being said this is quite a significant wound to heal it is going to take a bit of time for certain. Electronic Signature(s) Signed: 10/01/2020 6:18:01 PM By: Lenda Kelp PA-C Entered By: Lenda Kelp on 10/01/2020 18:18:01 [name], [name]. ([id number]) -------------------------------------------------------------------------------- Physical Exam Details Patient Name: Goyne, Marleen [name]. Date of Service: 10/01/2020 2:30 PM Medical Record Number: [id number] Patient Account Number: [id number] Date of Birth/Sex: 06-Jun-1940 (80 y.o. F) Treating RN: Rogers Blocker Primary Care Provider: [name] Other Clinician: Referring Provider: [name] Treating Provider/Extender: Rowan Blase in Treatment: 0 Constitutional sitting or standing blood pressure is within target range for patient.. supine blood pressure is within target range for patient.. pulse regular and within target range for patient.Marland Kitchen respirations regular, non-labored and within target range for patient.Marland Kitchen temperature within target range for patient.. Well-nourished and well-hydrated in no acute distress. Eyes conjunctiva clear no eyelid edema noted. pupils equal round and reactive to light and accommodation. Ears, Nose, Mouth, and Throat no gross abnormality of ear auricles or external auditory canals. normal hearing noted during conversation. mucus membranes moist. Respiratory normal breathing without difficulty. Cardiovascular 2+ dorsalis pedis/posterior tibialis pulses. no clubbing, cyanosis, significant edema, <3 sec cap refill. Musculoskeletal normal gait and posture. no significant deformity or arthritic changes, no loss or range of motion, no clubbing. Psychiatric this patient is able to make decisions and demonstrates good insight into disease process.  Alert and Oriented x 3. pleasant and cooperative. Notes Upon inspection patient's wound bed actually showed signs of a significant skin tear to the left lower leg. This extends from the anterior shin laterally. With that being said there is a significant skin flap tear. With the other 2 wounds she has 1 over the knee as well as over the wrist I think these are much more normal skin tears which to be perfectly honest do not appear to be doing too poorly. I think Xeroform will likely do here. With that being said the one on the leg is much larger and deeper as far as the depth of the tear is concerned. Nonetheless I did discuss with the patient that I think the main thing is good to be to try to clean out the hematoma and try to see if we can get this to reattach as far as the skin flap is concerned. There is a portion of the flap this is just not going to survive but I think we should go ahead and remove and that way we can start getting this to hopefully reattach appropriately where he can. The patient is in agreement with that plan. I did perform debridement including cleaning away the congealed blood where the hematoma is present laterally. She actually tolerated this today without complication. With that being said she did have some discomfort we did use numbing spray to  try to help in this regard. When it came to the other 2 wounds no procedures was necessary at this point. I did have to remove a portion of the skin flap which is just becoming necrotic and is not good to survive but was also cool to touch. Obviously I do not think this was good to be a viable portion of the wound and there may be be some other areas which are nonviable that we will have to address as well. Either way I think the biggest issue is simply that we are going to need to take some time here in order to get this wound healed and to be perfectly honest I think this is going to take quite a bit of time. The patient is aware  of all this although hopefully the most extensive debridement That we are going to have to do was actually completed today. Electronic Signature(s) Signed: 10/01/2020 6:20:42 PM By: Lenda Kelp PA-C Entered By: Lenda Kelp on 10/01/2020 18:20:42 [name], [name]. ([id number]) -------------------------------------------------------------------------------- Physician Orders Details Patient Name: [name], [name]. Date of Service: 10/01/2020 2:30 PM Medical Record Number: [id number] Patient Account Number: [id number] Date of Birth/Sex: April 18, 1941 (80 y.o. 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 Home Health o Home Health Company: - [facility] o CONTINUE Home Health for wound care. May utilize formulary equivalent dressing for wound treatment orders unless otherwise specified. Home Health Nurse may visit PRN to address patientos wound care needs. o Scheduled days for dressing changes to be completed; exception, patient has scheduled wound care visit that day. o **Please direct any NON-WOUND related issues/requests for orders to patient's Primary Care Physician. **If current dressing causes regression in wound  condition, may D/C ordered dressing product/s and apply Normal Saline Moist Dressing daily until next Wound Healing Center or Other MD appointment. **Notify Wound Healing Center of regression in wound condition at [phone]. Bathing/ Shower/ Hygiene o Clean wound with Normal Saline or wound cleanser. o May shower with wound dressing protected with water repellent cover or cast protector. Edema Control - Lymphedema / Segmental Compressive Device / Other o Optional: One layer of unna paste to top of compression wrap (to act as an anchor). o Elevate legs to the level of the heart and pump ankles as often as possible o Elevate leg(s) parallel to the floor when sitting. Wound Treatment Wound #3 - Lower Leg Wound Laterality: Left, Midline Cleanser: Normal Saline 3 x Per Week/30 Days Discharge Instructions: Wash your hands with soap and water. Remove old dressing, discard into plastic bag and place into trash. Cleanse the wound with Normal Saline prior to applying a clean dressing using gauze sponges, not tissues or cotton balls. Do not scrub or use excessive force. Pat dry using gauze sponges, not tissue or cotton balls. Primary Dressing: Prisma 4.34 (in) 3 x Per Week/30 Days Discharge Instructions: APPLIED IN CLINIC ONLY Primary Dressing: Silvercel  4 1/4x 4 1/4 (in/in) 3 x Per Week/30 Days Discharge Instructions: Apply Silvercel 4 1/4x 4 1/4 (in/in) on wound bed-use saline to remove Secondary Dressing: Xtrasorb Large 6x9 (in/in) 3 x Per Week/30 Days Discharge Instructions: Apply to wound as directed. Do not cut. Compression Wrap: Profore Lite LF 3 Multilayer Compression Bandaging System 3 x Per Week/30 Days Discharge Instructions: Apply 3 multi-layer wrap as prescribed. Wound #4 - Knee Wound Laterality: Left, Medial, Anterior [place], Jayleen [name]. ([id number]) Cleanser: Soap and Water 3 x Per Week/30 Days Discharge Instructions: Gently cleanse wound with antibacterial soap, rinse and pat  dry prior to dressing wounds Primary Dressing: Xeroform 4x4-HBD (in/in) 3 x Per Week/30 Days Discharge Instructions: Apply Xeroform 4x4-HBD (in/in) as directed Secondary Dressing: Mepilex Border Flex, 4x4 (in/in) 3 x Per Week/30 Days Discharge Instructions: Apply to wound as directed. Do not cut. Wound #5 - Wrist Wound Laterality: Right, Anterior Cleanser: Normal Saline 3 x Per Week/30 Days Discharge Instructions: Wash your hands with soap and water. Remove old dressing, discard into plastic bag and place into trash. Cleanse the wound with Normal Saline prior to applying a clean dressing using gauze sponges, not tissues or cotton balls. Do not scrub or use excessive force. Pat dry using gauze sponges, not tissue or cotton balls. Primary Dressing: Xeroform-HBD 2x2 (in/in) 3 x Per Week/30 Days Discharge Instructions: Apply Xeroform-HBD 2x2 (in/in) as directed Secondary Dressing: Telfa Adhesive [place] Dressing, 4x4 (in/in) 3 x Per Week/30 Days Discharge Instructions: Apply over dressing to secure in place. Electronic Signature(s) Signed: 10/01/2020 5:06:40 PM By: Phillis Haggis, Dondra Prader RN Signed: 10/01/2020 8:15:19 PM By: Lenda Kelp PA-C Entered By: Phillis Haggis, Dondra Prader on 10/01/2020 16:18:22 [name], [name]. ([id number]) -------------------------------------------------------------------------------- Problem List Details Patient Name: Sylve, Teena [name]. Date of Service: 10/01/2020 2:30 PM Medical Record Number: [id number] Patient Account Number: [id number] Date of Birth/Sex: Feb 10, 1941 (80 y.o. 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 of other part of left lower leg with muscle  10/01/2020 No Yes involvement without evidence of necrosis S81.802A Unspecified open wound, left lower leg, initial encounter 10/01/2020 No Yes S61.501A Unspecified open wound of right wrist, initial encounter 10/01/2020 No Yes I48.0 Paroxysmal atrial fibrillation 10/01/2020 No Yes I10 Essential (primary) hypertension 10/01/2020 No Yes I50.42 Chronic combined systolic (congestive) and diastolic (congestive) heart 10/01/2020 No Yes failure Z79.01 Long term (current) use of anticoagulants 10/01/2020 No Yes Inactive Problems Resolved Problems Electronic Signature(s) Signed: 10/01/2020 3:35:22 PM By: Lenda Kelp PA-C Previous Signature: 10/01/2020 3:33:54 PM Version By: Lenda Kelp PA-C Entered By: Lenda Kelp on 10/01/2020 15:35:22 [name], [name]. ([id number]) -------------------------------------------------------------------------------- Progress Note Details Patient Name: [name], [name]. Date of Service: 10/01/2020 2:30 PM Medical Record Number: [id number] Patient Account Number: [id number] Date of Birth/Sex: Jan 19, 1941 (80 y.o. F) Treating RN: Rogers Blocker Primary Care Provider: [name] Other Clinician: Referring Provider: [name] Treating Provider/Extender: Rowan Blase in Treatment: 0 Subjective Chief Complaint Information obtained from Patient Left LE ulcer, left knee ulcer, and right knee ulcer History of Present Illness (HPI) 06/07/2019 upon evaluation today patient presents today for initial evaluation in our clinic concerning issues she is having with her left lower extremity. She tells me that she hit her leg on a rocking chair when she was getting up  and this was roughly at the beginning of December. Dr. Hyacinth Meeker who is her primary care provider has been helping with treating this area since that time. The patient states unfortunately this just is not getting any better. He is attempted salt water soaks as well as using antibiotic ointments topically  unfortunately she has not noted much improvement. He wanted her to come to the wound center sooner but she declined although when things did not seem to be getting better she finally consented to this. Currently the patient does appear to have cellulitis of left lower extremity she also has atrial fibrillation noted for which she is on Eliquis this is probably part of the reason that she developed a large hematoma that she did. Subsequently she also has hypertension as well as congestive heart failure. The patient does not appear to be febrile today and really is doing fairly well as far as that is concerned although the leg is warm to touch pretty much throughout. No fevers, chills, nausea, vomiting, or diarrhea. She has not been on any oral antibiotics. 06/28/2019 patient actually is seen today in follow-up for her lower extremity ulcer. She is status post having actually spent 9 days in the hospital following when I sent her over for evaluation. They did have to keep her in the hospital and treat her with IV vancomycin secondary to the fact that she unfortunately had a bacteria present that obviously can be managed with oral medication with her penicillin allergy. Nonetheless her leg appears to be doing tremendously better today. There is no signs of active infection and overall very pleased with how things are progressing. I do believe that the wound is also measuring better along with looking much less irritated overall I think she is going to do quite well at this point. 07/05/2019 on evaluation today patient actually appears to be doing quite well with regard to her wound which is measuring smaller and still appears to be doing tremendously better compared to prior evaluation. I do believe the Melburn Popper has been of benefit for her. 07/12/19 patient's wound actually appears to be doing excellent at this time in regard to the left posterior lower extremity. She has been tolerating the dressing changes  without complication. Fortunately there is much improvement noted here there is some slough noted she has been using Santyl I think that still appropriate. With that being said I am going to go ahead and perform sharp debridement today to clear away some of the necrotic material as well. 07/19/2019 upon evaluation today patient actually appears to be doing quite well in regard to her leg ulcer. She has been using Santyl and has been doing excellent with this up to this point. However I think based on what I am seeing today we can likely proceed to doing something else, collagen, to help with getting this wound to heal more effectively and quickly. 07/26/2019 upon evaluation today patient appears to be doing excellent in regard to her lower extremity ulcer. She has been tolerating the dressing changes without complication. Fortunately the wound is continuing to measure smaller and does seem to be progressing quite nicely. I am very pleased with how things are going at this point. The patient likewise is extremely happy. 08/02/2019 upon evaluation today patient appears to be healing quite nicely and is making excellent progress at this point. Fortunately there is no evidence of active infection at this time which is good news. No fever chills noted 08/09/2019 on evaluation today patient's wound  actually is continuing to show signs of excellent improvement. Is measuring much smaller this week even compared to last week and overall I am extremely pleased with the progress. Fortunately there is no evidence of active infection at this time. 08/23/2019 upon evaluation today patient appears to be doing well with regard to her original wound she is showing some signs of improvement here which is great news. Fortunately there is no signs of active infection at this time. No fevers, chills, nausea, vomiting, or diarrhea. 08/30/2019 upon evaluation today patient actually appears to be completely healed. She has little  bit of dry skin at the location where the wound was but overall she has done extremely well. There is no signs of active infection at this time which is great news. Readmission: 11/22/2019 upon evaluation today patient appears to be doing somewhat poorly in regard to her leg ulcer. She tells me unfortunately that she fell out of bed about 1 month ago and subsequently has been monitoring this since she initially had a hematoma and it sounds like they were trying to get this to reabsorb Dr. Hyacinth Meeker that is her primary care provider. With that being said her podiatrist apparently tried to open this up according to what the patient tells me today and then subsequently gave her an antibiotic for her toe and said that would also help with the area on her leg. They were not able to get anything to come out as her podiatrist stated that this seemed to be too thick in order to drain. With that being said the patient was eventually referred back here to the wound center for further management. 11/29/2019 on evaluation today patient appears to be doing a little bit more poorly today regarding her wound as compared to her last visit here. Again this is due to blistering on the edges of the wound which is very superficial but nonetheless new since last time. This is likely due to the fact that she did have to take off her compression wrap as she was having significant discomfort due to gout that flared up in her foot. Nonetheless she was seen by her primary care provider where she was given a shot of steroids and has new medication for gout that she is now taking. She still having discomfort however she states she does not believe she is good to be able to wear the wrap. [name], [name]. ([id number]) 12/19/2019 on evaluation today patient actually appears to be making excellent progress in regard to her wound. She has been tolerating the dressing changes without complication. Fortunately there is no signs of active  infection at this time. 12/27/2019 on evaluation today patient appears to be doing well with regard to her wound. This is measuring significantly smaller overall feel like the silver cell has done excellent for her. Fortunately there is no signs of active infection at this time. 01/03/2020 upon evaluation today patient's wound actually is very close to complete resolution. She in fact is measuring significantly smaller and there does not appear to be any evidence of significant drainage really I feel like this will be healed by next week. 01/10/2020 on evaluation today patient actually appears to be doing quite well in regard to her wound in fact this appears to be completely healed and I am very pleased that she has done so well. Fortunately there is no signs of active infection at this time. Readmission: 10/01/2020 upon evaluation today patient presents for reevaluation here in our clinic due to a  very significant skin tear that occurred when she sustained a fall about 1 week ago. Unfortunately there really was not much done from the standpoint of clearing away some of the flap or even helping to pull this over and reapproximated in the emergency department based on what I see. It appears that the surgeon was consulted that would be Dr. Lady Gary and apparently it was believed that the best thing would be for them to follow-up here at the wound care center. With that being said we never received a call from the emergency department although it stated in the note that they contacted [place] but were unable to get in touch with anyone. Subsequently the patient states that they left the ER immediately following and contacted [place] and Velna Hatchet picked up immediately and got them on the schedule. Unfortunately I really feel like that if we had known the severity of the injury this may been something we should have try to get her in sooner for. Nonetheless she did see her primary care provider fortunately Dr. Hyacinth Meeker did  take her off of the blood thinner, Eliquis, she does have a significant hematoma on top of a very severe skin tear here. He also placed her on Keflex as apparently the ER placed her on Bactrim which she is allergic to. Dr. Hyacinth Meeker recognize this and before she got the medication did switch it which is all some. With all that being said the patient unfortunately is having some discomfort and it does appear to be doing somewhat poorly although she is in good spirits she is happy to be here she has had good results here at the wound care center before and she trust [place] which I do appreciate. With that being said this is quite a significant wound to heal it is going to take a bit of time for certain. Patient History Information obtained from Patient. Allergies penicillin, amlodipine, Vicodin, Betadine, Sulfa (Sulfonamide Antibiotics) Social History Never smoker, Marital Status - Widowed, Alcohol Use - Rarely, Drug Use - No History, Caffeine Use - Moderate. Medical History Eyes Patient has history of Cataracts - removed Denies history of Glaucoma, Optic Neuritis Hematologic/Lymphatic Denies history of Anemia, Hemophilia, Human Immunodeficiency Virus, Lymphedema, Sickle Cell Disease Respiratory Denies history of Aspiration, Asthma, Chronic Obstructive Pulmonary Disease (COPD), Pneumothorax, Sleep Apnea, Tuberculosis Cardiovascular Patient has history of Arrhythmia - A Fib, Congestive Heart Failure, Hypertension Denies history of Coronary Artery Disease, Deep Vein Thrombosis, Hypotension, Myocardial Infarction, Peripheral Arterial Disease, Peripheral Venous Disease, Phlebitis, Vasculitis Gastrointestinal Denies history of Cirrhosis , Colitis, Crohn s, Hepatitis A, Hepatitis [name], Hepatitis C Endocrine Denies history of Type I Diabetes, Type II Diabetes Genitourinary Denies history of End Stage Renal Disease Immunological Denies history of Lupus Erythematosus, Raynaud s, Scleroderma Integumentary  (Skin) Denies history of History of Burn, History of pressure wounds Musculoskeletal Patient has history of Gout, Osteoarthritis Denies history of Rheumatoid Arthritis, Osteomyelitis Neurologic Denies history of Dementia, Neuropathy, Quadriplegia, Paraplegia, Seizure Disorder Oncologic Denies history of Received Chemotherapy, Received Radiation Psychiatric Denies history of Anorexia/bulimia, Confinement Anxiety Hospitalization/Surgery History - [facility] sepsis. Review of Systems (ROS) Ear/Nose/Mouth/Throat Denies complaints or symptoms of Difficult clearing ears, Sinusitis. Hematologic/Lymphatic Denies complaints or symptoms of Bleeding / Clotting Disorders, Human Immunodeficiency Virus. Respiratory Complains or has symptoms of Shortness of Breath - with exertion, PRN Oxygen at home and uses 1lpm via n/c when sleeping [name], [name]. ([id number]) Cardiovascular Complains or has symptoms of LE edema. Gastrointestinal Denies complaints or symptoms of Frequent diarrhea, Nausea, Vomiting. Endocrine  Denies complaints or symptoms of Hepatitis, Thyroid disease, Polydypsia (Excessive Thirst). Genitourinary Denies complaints or symptoms of Kidney failure/ Dialysis, Incontinence/dribbling. Immunological Denies complaints or symptoms of Hives, Itching. Integumentary (Skin) Complains or has symptoms of Breakdown, Swelling. Neurologic Denies complaints or symptoms of Numbness/parasthesias, Focal/Weakness. Psychiatric Complains or has symptoms of Anxiety. Objective Constitutional sitting or standing blood pressure is within target range for patient.. supine blood pressure is within target range for patient.. pulse regular and within target range for patient.Marland Kitchen respirations regular, non-labored and within target range for patient.Marland Kitchen temperature within target range for patient.. Well-nourished and well-hydrated in no acute distress. Vitals Time Taken: 2:55 PM, Height: 66 in, Source: Stated, Weight:  174 lbs, Source: Stated, BMI: 28.1, Temperature: 98.2 F, Pulse: 77 bpm, Respiratory Rate: 18 breaths/min, Blood Pressure: 130/54 mmHg. Eyes conjunctiva clear no eyelid edema noted. pupils equal round and reactive to light and accommodation. Ears, Nose, Mouth, and Throat no gross abnormality of ear auricles or external auditory canals. normal hearing noted during conversation. mucus membranes moist. Respiratory normal breathing without difficulty. Cardiovascular 2+ dorsalis pedis/posterior tibialis pulses. no clubbing, cyanosis, significant edema, Musculoskeletal normal gait and posture. no significant deformity or arthritic changes, no loss or range of motion, no clubbing. Psychiatric this patient is able to make decisions and demonstrates good insight into disease process. Alert and Oriented x 3. pleasant and cooperative. General Notes: Upon inspection patient's wound bed actually showed signs of a significant skin tear to the left lower leg. This extends from the anterior shin laterally. With that being said there is a significant skin flap tear. With the other 2 wounds she has 1 over the knee as well as over the wrist I think these are much more normal skin tears which to be perfectly honest do not appear to be doing too poorly. I think Xeroform will likely do here. With that being said the one on the leg is much larger and deeper as far as the depth of the tear is concerned. Nonetheless I did discuss with the patient that I think the main thing is good to be to try to clean out the hematoma and try to see if we can get this to reattach as far as the skin flap is concerned. There is a portion of the flap this is just not going to survive but I think we should go ahead and remove and that way we can start getting this to hopefully reattach appropriately where he can. The patient is in agreement with that plan. I did perform debridement including cleaning away the congealed blood where the  hematoma is present laterally. She actually tolerated this today without complication. With that being said she did have some discomfort we did use numbing spray to try to help in this regard. When it came to the other 2 wounds no procedures was necessary at this point. I did have to remove a portion of the skin flap which is just becoming necrotic and is not good to survive but was also cool to touch. Obviously I do not think this was good to be a viable portion of the wound and there may be be some other areas which are nonviable that we will have to address as well. Either way I think the biggest issue is simply that we are going to need to take some time here in order to get this wound healed and to be perfectly honest I think this is going to take quite a bit of time. The patient  is aware of all this although hopefully the most extensive debridement That we are going to have to do was actually completed today. Integumentary (Hair, Skin) Wound #3 status is Open. Original cause of wound was Skin Tear/Laceration. The date acquired was: 09/28/2020. The wound is located on the Left,Midline Lower Leg. The wound measures 14cm length x 10cm width x 0.5cm depth; 109.956cm^2 area and 54.978cm^3 volume. There is Fat Layer (Subcutaneous Tissue) and fascia exposed. There is no tunneling or undermining noted. There is a large amount of sanguinous drainage noted. There is large (67-100%) red granulation within the wound bed. There is a small (1-33%) amount of necrotic tissue within the wound bed including Adherent Slough. Wound #4 status is Open. Original cause of wound was Skin Tear/Laceration. The date acquired was: 09/28/2020. The wound is located on the Left,Medial,Anterior Knee. The wound measures 5cm length x 4cm width x 0.5cm depth; 15.708cm^2 area and 7.854cm^3 volume. There is Fat Layer (Subcutaneous Tissue) exposed. There is no tunneling or undermining noted. There is a large amount of serosanguineous  drainage noted. [name], [name]. ([id number]) There is medium (34-66%) red, pink granulation within the wound bed. There is a medium (34-66%) amount of necrotic tissue within the wound bed including Adherent Slough. Wound #5 status is Open. Original cause of wound was Skin Tear/Laceration. The date acquired was: 09/28/2020. The wound is located on the Right,Anterior Wrist. The wound measures 0.3cm length x 2cm width x 0.1cm depth; 0.471cm^2 area and 0.047cm^3 volume. There is Fat Layer (Subcutaneous Tissue) exposed. There is no tunneling or undermining noted. There is a small amount of sanguinous drainage noted. There is medium (34-66%) granulation within the wound bed. There is a medium (34-66%) amount of necrotic tissue within the wound bed including Eschar. 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 There was a Excisional Skin/Subcutaneous Tissue Debridement with a total area of 70 sq cm performed by Nelida Meuse., PA-C. With the following instrument(s): Curette, Forceps, and Scissors to remove Non-Viable tissue/material. Material removed includes Blood Clots, Fat, Subcutaneous Tissue, Skin: Dermis, and Skin: Epidermis. A time out was conducted at 15:57, prior to the start of the procedure. A Moderate amount of bleeding was controlled with Pressure. The procedure was tolerated well. Post Debridement Measurements: 14cm length x 10cm width x  0.6cm depth; 65.973cm^3 volume. Character of Wound/Ulcer Post Debridement is stable. Post procedure Diagnosis Wound #3: Same as Pre-Procedure Plan Follow-up Appointments: Return Appointment in 1 week. Home Health: Home Health Company: - [facility] Home Health for wound care. May utilize formulary equivalent dressing for wound treatment orders unless otherwise specified. Home Health Nurse may visit PRN to address patient s wound care needs. Scheduled days for dressing changes to be completed; exception, patient has scheduled wound care visit that day. **Please direct any NON-WOUND related issues/requests for orders to patient's Primary Care Physician. **If current dressing causes regression in wound condition, may D/C ordered dressing product/s and apply Normal Saline Moist Dressing daily until next Wound Healing Center or Other MD appointment. **Notify Wound Healing Center of regression in wound  condition at [phone]. Bathing/ Shower/ Hygiene: Clean wound with Normal Saline or wound cleanser. May shower with wound dressing protected with water repellent cover or cast protector. Edema Control - Lymphedema / Segmental Compressive Device / Other: Optional: One layer of unna paste to top of compression wrap (to act as an anchor). Elevate legs to the level of the heart and pump ankles as often as possible Elevate leg(s) parallel to the floor when sitting. WOUND #3: - Lower Leg Wound Laterality: Left, Midline Cleanser: Normal Saline 3 x Per Week/30 Days Discharge Instructions: Wash your hands with soap and water. Remove old dressing, discard into plastic bag and place into trash. Cleanse the wound with Normal Saline prior to applying a clean dressing using gauze sponges, not tissues or cotton balls. Do not scrub or use excessive force. Pat dry using gauze sponges, not tissue or cotton balls. Primary Dressing: Prisma 4.34 (in) 3 x Per Week/30 Days Discharge Instructions: APPLIED IN  CLINIC ONLY Primary Dressing: Silvercel 4 1/4x 4 1/4 (in/in) 3 x Per Week/30 Days Discharge Instructions: Apply Silvercel 4 1/4x 4 1/4 (in/in) on wound bed-use saline to remove Decarlo, Amorita [name]. ([id number]) Secondary Dressing: Anola Gurney Large 6x9 (in/in) 3 x Per Week/30 Days Discharge Instructions: Apply to wound as directed. Do not cut. Compression Wrap: Profore Lite LF 3 Multilayer Compression Bandaging System 3 x Per Week/30 Days Discharge Instructions: Apply 3 multi-layer wrap as prescribed. WOUND #4: - Knee Wound Laterality: Left, Medial, Anterior Cleanser: Soap and Water 3 x Per Week/30 Days Discharge Instructions: Gently cleanse wound with antibacterial soap, rinse and pat dry prior to dressing wounds Primary Dressing: Xeroform 4x4-HBD (in/in) 3 x Per Week/30 Days Discharge Instructions: Apply Xeroform 4x4-HBD (in/in) as directed Secondary Dressing: Mepilex Border Flex, 4x4 (in/in) 3 x Per Week/30 Days Discharge Instructions: Apply to wound as directed. Do not cut. WOUND #5: - Wrist Wound Laterality: Right, Anterior Cleanser: Normal Saline 3 x Per Week/30 Days Discharge Instructions: Wash your hands with soap and water. Remove old dressing, discard into plastic bag and place into trash. Cleanse the wound with Normal Saline prior to applying a clean dressing using gauze sponges, not tissues or cotton balls. Do not scrub or use excessive force. Pat dry using gauze sponges, not tissue or cotton balls. Primary Dressing: Xeroform-HBD 2x2 (in/in) 3 x Per Week/30 Days Discharge Instructions: Apply Xeroform-HBD 2x2 (in/in) as directed Secondary Dressing: Telfa Adhesive [place] Dressing, 4x4 (in/in) 3 x Per Week/30 Days Discharge Instructions: Apply over dressing to secure in place. 1. Would recommend currently that we actually put a little bit of collagen underneath the flap currently. I think that we cleared out enough of the hematoma that hopefully the collagen will help this to reattach that  is my hope and goal anyway. For the time being I am not going to suggest that we have home health apply more collagen underneath. I think the best things go to be after we put this collagen and to just leave the flap alone and just use silver alginate on the outside of the wound. The patient voiced understanding we put this in the orders for home health as well. 2. I am also can recommend at this time that we use again silver alginate over top of the area where this is going to drain recommend using a 3 layer compression wrap as well to help with edema control and to apply compression to get this to hopefully reattach as far as the skin flap is concerned. We will see  how this goes. 3. With regard to the knee where he can use Xeroform followed by border foam dressing the same thing is true of the wrist as well both of these are much more superficial and though not completely innocuous are of much less concern compared to this leg ulcer which again is quite significant. We will see patient back for reevaluation in 1 week here in the clinic. If anything worsens or changes patient will contact our office for additional recommendations. Electronic Signature(s) Signed: 10/01/2020 6:24:02 PM By: Lenda Kelp PA-C Previous Signature: 10/01/2020 6:22:17 PM Version By: Lenda Kelp PA-C Entered By: Lenda Kelp on 10/01/2020 18:24:02 Stradley, Danaija [name]. ([id number]) -------------------------------------------------------------------------------- ROS/PFSH Details Patient Name: Mudry, Margherita [name]. Date of Service: 10/01/2020 2:30 PM Medical Record Number: [id number] Patient Account Number: [id number] Date of Birth/Sex: Jun 18, 1940 (80 y.o. F) Treating RN: Hansel Feinstein Primary Care Provider: [name] Other Clinician: Referring Provider: [name] Treating Provider/Extender: Rowan Blase in Treatment: 0 Information Obtained From Patient Ear/Nose/Mouth/Throat Complaints and Symptoms: Negative for:  Difficult clearing ears; Sinusitis Hematologic/Lymphatic Complaints and Symptoms: Negative for: Bleeding / Clotting Disorders; Human Immunodeficiency Virus Medical History: Negative for: Anemia; Hemophilia; Human Immunodeficiency Virus; Lymphedema; Sickle Cell Disease Respiratory Complaints and Symptoms: Positive for: Shortness of Breath - with exertion Review of System Notes: PRN Oxygen at home and uses 1lpm via n/c when sleeping Medical History: Negative for: Aspiration; Asthma; Chronic Obstructive Pulmonary Disease (COPD); Pneumothorax; Sleep Apnea; Tuberculosis Cardiovascular Complaints and Symptoms: Positive for: LE edema Medical History: Positive for: Arrhythmia - A Fib; Congestive Heart Failure; Hypertension Negative for: Coronary Artery Disease; Deep Vein Thrombosis; Hypotension; Myocardial Infarction; Peripheral Arterial Disease; Peripheral Venous Disease; Phlebitis; Vasculitis Gastrointestinal Complaints and Symptoms: Negative for: Frequent diarrhea; Nausea; Vomiting Medical History: Negative for: Cirrhosis ; Colitis; Crohnos; Hepatitis A; Hepatitis [name]; Hepatitis C Endocrine Complaints and Symptoms: Negative for: Hepatitis; Thyroid disease; Polydypsia (Excessive Thirst) Medical History: Negative for: Type I Diabetes; Type II Diabetes Genitourinary Complaints and Symptoms: Negative for: Kidney failure/ Dialysis; Incontinence/dribbling Medical History: Negative for: End Stage Renal Disease Immunological Mongeau, Joyous [name]. ([id number]) Complaints and Symptoms: Negative for: Hives; Itching Medical History: Negative for: Lupus Erythematosus; Raynaudos; Scleroderma Integumentary (Skin) Complaints and Symptoms: Positive for: Breakdown; Swelling Medical History: Negative for: History of Burn; History of pressure wounds Neurologic Complaints and Symptoms: Negative for: Numbness/parasthesias; Focal/Weakness Medical History: Negative for: Dementia; Neuropathy; Quadriplegia;  Paraplegia; Seizure Disorder Psychiatric Complaints and Symptoms: Positive for: Anxiety Medical History: Negative for: Anorexia/bulimia; Confinement Anxiety Eyes Medical History: Positive for: Cataracts - removed Negative for: Glaucoma; Optic Neuritis Musculoskeletal Medical History: Positive for: Gout; Osteoarthritis Negative for: Rheumatoid Arthritis; Osteomyelitis Oncologic Medical History: Negative for: Received Chemotherapy; Received Radiation HBO Extended History Items Eyes: Cataracts Immunizations Pneumococcal Vaccine: Received Pneumococcal Vaccination: Yes Implantable Devices None Hospitalization / Surgery History Type of Hospitalization/Surgery [facility] sepsis Family and Social History Never smoker; Marital Status - Widowed; Alcohol Use: Rarely; Drug Use: No History; Caffeine Use: Moderate; Financial Concerns: No; Food, Clothing or Shelter Needs: No; Support System Lacking: No; Transportation Concerns: No Read, Jayln [name]. ([id number]) Electronic Signature(s) Signed: 10/01/2020 8:15:19 PM By: Lenda Kelp PA-C Signed: 10/02/2020 10:34:15 AM By: Hansel Feinstein Entered By: Hansel Feinstein on 10/01/2020 15:05:04 Bedoya, Shaquita [name]. ([id number]) -------------------------------------------------------------------------------- SuperBill Details Patient Name: Ozturk, Nikeria [name]. Date of Service: 10/01/2020 Medical Record Number: [id number] Patient Account Number: [id number] Date of Birth/Sex: 11-27-1940 (80 y.o. 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 S81.802A Unspecified open wound, left lower leg, initial encounter S61.501A Unspecified open  wound of right wrist, initial encounter I48.0 Paroxysmal atrial fibrillation I10 Essential (primary) hypertension I50.42 Chronic combined systolic (congestive) and diastolic (congestive) heart failure Z79.01 Long term (current) use of anticoagulants Facility Procedures CPT4: Description Modifier Quantity Code 40981191 99212 - WOUND CARE VISIT-LEV 2 EST PT 1 CPT4: 47829562 11042 - DEB SUBQ TISSUE 20 SQ CM/< 1 CPT4: ICD-10 Diagnosis Description 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 CPT4: 13086578 11045 - DEB SUBQ TISS EA ADDL 20CM 3 CPT4: ICD-10 Diagnosis Description 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 Physician Procedures CPT4: Description Modifier Quantity Code 4696295 99214 - WC PHYS LEVEL 4 - EST PT 25 1 CPT4: ICD-10 Diagnosis Description I87.2 Venous insufficiency (chronic) (peripheral) 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 ([id number]) Electronic Signature(s) Signed: 10/01/2020 6:24:46 PM By: Lenda Kelp PA-C Previous Signature: 10/01/2020 6:22:57 PM Version By: Lenda Kelp PA-C Previous Signature: 10/01/2020 5:06:40 PM Version By: Phillis Haggis, Dondra Prader RN Entered By: Lenda Kelp on  10/01/2020 18:24:46

## 2020-10-02 NOTE — Progress Notes (Addendum)
Lama, Brendia B. ([id number]) Visit Report for 10/01/2020 Allergy List Details Patient Name: Kaczmarczyk, Eliyana B. Date of Service: 10/01/2020 2:30 PM Medical Record Number: [id number] Patient Account Number: [id number] Date of Birth/Sex: 1941/02/22 (80 y.o. F) Treating RN: Hansel Feinstein Primary Care Kristinia Leavy: [name] Other Clinician: Referring Nasier Thumm: [name] Treating Calloway Andrus/Extender: Rowan Blase in Treatment: 0 Allergies Active Allergies penicillin amlodipine Vicodin Betadine Sulfa (Sulfonamide Antibiotics) Allergy Notes Electronic Signature(s) Signed: 10/02/2020 10:34:15 AM By: Hansel Feinstein Entered By: Hansel Feinstein on 10/01/2020 14:59:47 Chern, Leanda B. ([id number]) -------------------------------------------------------------------------------- Arrival Information Details Patient Name: Mendibles, Leland B. Date of Service: 10/01/2020 2:30 PM Medical Record Number: [id number] Patient Account Number: [id number] Date of Birth/Sex: 1940/11/03 (80 y.o. F) Treating RN: Hansel Feinstein Primary Care Zeriah Baysinger: [name] Other Clinician: Referring Loghan Kurtzman: [name] Treating Adelfo Diebel/Extender: Rowan Blase in Treatment: 0 Visit Information Patient Arrived: Wheel Chair Arrival Time: 14:49 Accompanied By: Melina Modena Transfer Assistance: EasyPivot Patient Lift Patient Has Alerts: Yes Patient Alerts: Patient on Blood Thinner ELIQUIS (on hold 5/10) NOT diabetic History Since Last Visit Has Dressing in Place as Prescribed: Yes Pain Present Now: Yes Electronic Signature(s) Signed: 10/02/2020 10:34:15 AM By: Hansel Feinstein Entered By: Hansel Feinstein on 10/01/2020 14:58:00 Tracey, Christmas B. ([id number]) -------------------------------------------------------------------------------- Clinic Level of Care Assessment Details Patient Name: Budai, Myleen B. Date of Service: 10/01/2020 2:30 PM Medical Record Number: [id number] Patient Account Number: [id number] Date of Birth/Sex: 1940-06-03 (80 y.o. F) Treating  RN: Rogers Blocker Primary Care Jaunice Mirza: [name] Other Clinician: Referring Deira Shimer: [name] Treating Corby Villasenor/Extender: Rowan Blase in Treatment: 0 Clinic Level of Care Assessment Items TOOL 1 Quantity Score X - Use when EandM and Procedure is performed on INITIAL visit 1 0 ASSESSMENTS - Nursing Assessment / Reassessment X - General Physical Exam (combine w/ comprehensive assessment (listed just below) when performed on new 1 20 pt. evals) X- 1 25 Comprehensive Assessment (HX, ROS, Risk Assessments, Wounds Hx, etc.) ASSESSMENTS - Wound and Skin Assessment / Reassessment []  - Dermatologic / Skin Assessment (not related to wound area) 0 ASSESSMENTS - Ostomy and/or Continence Assessment and Care []  - Incontinence Assessment and Management 0 []  - 0 Ostomy Care Assessment and Management (repouching, etc.) PROCESS - Coordination of Care X - Simple Patient / Family Education for ongoing care 1 15 []  - 0 Complex (extensive) Patient / Family Education for ongoing care X- 1 10 Staff obtains Consents, Records, Test Results / Process Orders []  - 0 Staff telephones HHA, Nursing Homes / Clarify orders / etc []  - 0 Routine Transfer to another Facility (non-emergent condition) []  - 0 Routine Hospital Admission (non-emergent condition) []  - 0 New Admissions / / Ordering NPWT, Apligraf, etc. []  - 0 Emergency Hospital Admission (emergent condition) PROCESS - Special Needs []  - Pediatric / Minor Patient Management 0 []  - 0 Isolation Patient Management []  - 0 Hearing / Language / Visual special needs []  - 0 Assessment of Community assistance (transportation, D/C planning, etc.) []  - 0 Additional assistance / Altered mentation []  - 0 Support Surface(s) Assessment (bed, cushion, seat, etc.) INTERVENTIONS - Miscellaneous []  - External ear exam 0 []  - 0 Patient Transfer (multiple staff / / Similar devices) []  - 0 Simple Staple /  Suture removal (25 or less) []  - 0 Complex Staple / Suture removal (26 or more) []  - 0 Hypo/Hyperglycemic Management (do not check if billed separately) []  - 0 Ankle / Brachial Index (ABI) - do not check if billed separately Has the  patient been seen at the hospital within the last three years: Yes Total Score: 70 Level Of Care: New/Established - Level 2 Damiano, Doriann B. ([id number]) Electronic Signature(s) Signed: 10/01/2020 5:06:40 PM By: Phillis Haggis, Dondra Prader RN Entered By: Phillis Haggis, Kenia on 10/01/2020 16:17:30 Bienaime, Meliana B. ([id number]) -------------------------------------------------------------------------------- Lower Extremity Assessment Details Patient Name: Schuman, Hermie B. Date of Service: 10/01/2020 2:30 PM Medical Record Number: [id number] Patient Account Number: [id number] Date of Birth/Sex: [birth year] (80 y.o. F) Treating RN: Hansel Feinstein Primary Care Undrea Shipes: [name] Other Clinician: Referring Hubert Raatz: [name] Treating Cebert Dettmann/Extender: Rowan Blase in Treatment: 0 Edema Assessment Assessed: [Left: Yes] [Right: No] Edema: [Left: Ye] [Right: s] Calf Left: Right: Point of Measurement: 30 cm From Medial Instep 31.4 cm Ankle Left: Right: Point of Measurement: 9 cm From Medial Instep 21.5 cm Knee To Floor Left: Right: From Medial Instep 41 cm Vascular Assessment Pulses: Dorsalis Pedis Palpable: [Left:No] Doppler Audible: [Left:Yes] Posterior Tibial Palpable: [Left:No Yes] Electronic Signature(s) Signed: 10/02/2020 10:34:15 AM By: Hansel Feinstein Entered By: Hansel Feinstein on 10/01/2020 15:16:15 Searfoss, Chace B. ([id number]) -------------------------------------------------------------------------------- Multi Wound Chart Details Patient Name: Arlington, Lasonia B. Date of Service: 10/01/2020 2:30 PM Medical Record Number: [id number] Patient Account Number: [id number] Date of Birth/Sex: Oct 11, 1940 (80 y.o. F) Treating RN: Rogers Blocker Primary Care  Alika Eppes: [name] Other Clinician: Referring Dove Gresham: [name] Treating Myrle Wanek/Extender: Rowan Blase in Treatment: 0 Vital Signs Height(in): 66 Pulse(bpm): 77 Weight(lbs): 174 Blood Pressure(mmHg): 130/54 Body Mass Index(BMI): 28 Temperature(F): 98.2 Respiratory Rate(breaths/min): 18 Photos: Wound Location: Left, Midline Lower Leg Left, Medial, Anterior Knee Right, Anterior Wrist Wounding Event: Skin Tear/Laceration Skin Tear/Laceration Skin Tear/Laceration Primary Etiology: Trauma, Other Trauma, Other Trauma, Other Comorbid History: Cataracts, Arrhythmia, Congestive Cataracts, Arrhythmia, Congestive Cataracts, Arrhythmia, Congestive Heart Failure, Hypertension, Gout, Heart Failure, Hypertension, Gout, Heart Failure, Hypertension, Gout, Osteoarthritis Osteoarthritis Osteoarthritis Date Acquired: 09/28/2020 09/28/2020 09/28/2020 Weeks of Treatment: 0 0 0 Wound Status: Open Open Open Measurements L x W x D (cm) 14x10x0.5 5x4x0.5 0.3x2x0.1 Area (cm) : 109.956 15.708 0.471 Volume (cm) : 54.978 7.854 0.047 Classification: Full Thickness With Exposed Full Thickness Without Exposed Full Thickness Without Exposed Support Structures Support Structures Support Structures Exudate Amount: Large Large Small Exudate Type: Sanguinous Serosanguineous Sanguinous Exudate Color: red red, brown red Granulation Amount: Large (67-100%) Medium (34-66%) Medium (34-66%) Granulation Quality: Red Red, Pink N/A Necrotic Amount: Small (1-33%) Medium (34-66%) Medium (34-66%) Necrotic Tissue: Adherent Slough Adherent Slough Eschar Exposed Structures: Fascia: Yes Fat Layer (Subcutaneous Tissue): Fat Layer (Subcutaneous Tissue): Fat Layer (Subcutaneous Tissue): Yes Yes Yes Fascia: No Fascia: No Tendon: No Tendon: No Muscle: No Muscle: No Joint: No Joint: No Bone: No Bone: No Treatment Notes Electronic Signature(s) Signed: 10/01/2020 5:06:40 PM By: Phillis Haggis, Dondra Prader RN Entered  By: Phillis Haggis, Dondra Prader on 10/01/2020 15:42:27 Henry, Briteny B. ([id number]) -------------------------------------------------------------------------------- Multi-Disciplinary Care Plan Details Patient Name: Mault, Aneesah B. Date of Service: 10/01/2020 2:30 PM Medical Record Number: [id number] Patient Account Number: [id number] Date of Birth/Sex: 1940-12-04 (80 y.o. F) Treating RN: Rogers Blocker Primary Care Urijah Raynor: [name] Other Clinician: Referring Janey Petron: [name] Treating Kaityln Kallstrom/Extender: Rowan Blase in Treatment: 0 Active Inactive Electronic Signature(s) Signed: 10/13/2020 4:46:01 PM By: [name], [name], RN, [name], [name] RN, [name] Signed: 11/13/2020 4:30:02 PM By: Lajean Manes RN Previous Signature: 10/01/2020 5:06:40 PM Version By: Phillis Haggis, Dondra Prader RN Entered By: [name], [name], RN, [name], [name] on 10/13/2020 16:46:01 Bergstresser, Aberdeen B. ([id number]) -------------------------------------------------------------------------------- Pain Assessment Details Patient Name: Antwine, Lizet B. Date of Service:  10/01/2020 2:30 PM Medical Record Number: [id number] Patient Account Number: [id number] Date of Birth/Sex: 02-06-1941 (80 y.o. F) Treating RN: Hansel FeinsteinBishop, Joy Primary Care Holger Sokolowski: Bethann PunchesMiller, Mark Other Clinician: Referring Talley Casco: Bethann PunchesMiller, Mark Treating Hermila Millis/Extender: Rowan BlaseStone, [name] Weeks in Treatment: 0 Active Problems Location of Pain Severity and Description of Pain Patient Has Paino Yes Site Locations Pain Location: Pain in Ulcers Rate the pain. Current Pain Level: 10 Pain Management and Medication Current Pain Management: Notes states pain is 10 when touching wounds- Electronic Signature(s) Signed: 10/02/2020 10:34:15 AM By: Hansel FeinsteinBishop, Joy Entered By: Hansel FeinsteinBishop, Joy on 10/01/2020 15:28:34 Preast, Calyn B. ([id number]) -------------------------------------------------------------------------------- Patient/Caregiver Education Details Patient Name: Zalenski, Charlize B. Date of Service:  10/01/2020 2:30 PM Medical Record Number: [id number] Patient Account Number: [id number] Date of Birth/Gender: 02-06-1941 [age] y.o. F) Treating RN: Rogers BlockerSanchez, Kenia Primary Care Physician: Bethann PunchesMiller, Mark Other Clinician: Referring Physician: Bethann PunchesMiller, Mark Treating Physician/Extender: Rowan BlaseStone, [name] Weeks in Treatment: 0 Education Assessment Education Provided To: Patient Education Topics Provided [facility]: Methods: Explain/Verbal Responses: State content correctly Wound/Skin Impairment: Methods: Explain/Verbal Responses: State content correctly Notes Instructions on when to go to ED Electronic Signature(s) Signed: 10/01/2020 5:06:40 PM By: Phillis HaggisSanchez Pereyda, Dondra PraderKenia RN Entered By: Phillis HaggisSanchez Pereyda, Kenia on 10/01/2020 16:19:06 Rochelle, Keyshawna B. ([id number]) -------------------------------------------------------------------------------- Wound Assessment Details Patient Name: Limpert, Madline B. Date of Service: 10/01/2020 2:30 PM Medical Record Number: [id number] Patient Account Number: [id number] Date of Birth/Sex: 02-06-1941 [age] y.o. F) Treating RN: Hansel FeinsteinBishop, Joy Primary Care Demetres Prochnow: Bethann PunchesMiller, Mark Other Clinician: Referring Cherissa Hook: Bethann PunchesMiller, Mark Treating Makinsey Pepitone/Extender: Rowan BlaseStone, [name] Weeks in Treatment: 0 Wound Status Wound Number: 3 Primary Trauma, Other Etiology: Wound Location: Left, Midline Lower Leg Wound Open Wounding Event: Skin Tear/Laceration Status: Date Acquired: 09/28/2020 Comorbid Cataracts, Arrhythmia, Congestive Heart Failure, Weeks Of Treatment: 0 History: Hypertension, Gout, Osteoarthritis Clustered Wound: No Photos Wound Measurements Length: (cm) 14 Width: (cm) 10 Depth: (cm) 0.5 Area: (cm) 109.956 Volume: (cm) 54.978 % Reduction in Area: % Reduction in Volume: Tunneling: No Undermining: No Wound Description Classification: Full Thickness With Exposed Support Structures Exudate Amount: Large Exudate Type: Sanguinous Exudate Color: red Foul  Odor After Cleansing: No Slough/Fibrino Yes Wound Bed Granulation Amount: Large (67-100%) Exposed Structure Granulation Quality: Red Fascia Exposed: Yes Necrotic Amount: Small (1-33%) Fat Layer (Subcutaneous Tissue) Exposed: Yes Necrotic Quality: Adherent Scientist, physiologicallough Electronic Signature(s) Signed: 10/02/2020 10:34:15 AM By: Hansel FeinsteinBishop, Joy Entered By: Hansel FeinsteinBishop, Joy on 10/01/2020 15:24:55 Crites, Franchon B. ([id number]) -------------------------------------------------------------------------------- Wound Assessment Details Patient Name: Chieffo, Suhayla B. Date of Service: 10/01/2020 2:30 PM Medical Record Number: [id number] Patient Account Number: [id number] Date of Birth/Sex: 02-06-1941 [age] y.o. F) Treating RN: Hansel FeinsteinBishop, Joy Primary Care Nella Botsford: Bethann PunchesMiller, Mark Other Clinician: Referring Russie Gulledge: Bethann PunchesMiller, Mark Treating Shewanda Sharpe/Extender: Rowan BlaseStone, [name] Weeks in Treatment: 0 Wound Status Wound Number: 4 Primary Trauma, Other Etiology: Wound Location: Left, Medial, Anterior Knee Wound Open Wounding Event: Skin Tear/Laceration Status: Date Acquired: 09/28/2020 Comorbid Cataracts, Arrhythmia, Congestive Heart Failure, Weeks Of Treatment: 0 History: Hypertension, Gout, Osteoarthritis Clustered Wound: No Photos Wound Measurements Length: (cm) 5 Width: (cm) 4 Depth: (cm) 0.5 Area: (cm) 15.708 Volume: (cm) 7.854 % Reduction in Area: % Reduction in Volume: Tunneling: No Undermining: No Wound Description Classification: Full Thickness Without Exposed Support Structu Exudate Amount: Large Exudate Type: Serosanguineous Exudate Color: red, brown res Foul Odor After Cleansing: No Slough/Fibrino Yes Wound Bed Granulation Amount: Medium (34-66%) Exposed Structure Granulation Quality: Red, Pink Fascia Exposed: No Necrotic Amount: Medium (34-66%) Fat Layer (Subcutaneous Tissue) Exposed: Yes Necrotic Quality: Adherent Slough Tendon Exposed:  No Muscle Exposed: No Joint Exposed: No Bone Exposed:  No Electronic Signature(s) Signed: 10/02/2020 10:34:15 AM By: Hansel Feinstein Entered By: Hansel Feinstein on 10/01/2020 15:26:40 Carducci, Zela B. ([id number]) -------------------------------------------------------------------------------- Wound Assessment Details Patient Name: Soley, Branda B. Date of Service: 10/01/2020 2:30 PM Medical Record Number: [id number] Patient Account Number: [id number] Date of Birth/Sex: 1941/04/28 (80 y.o. F) Treating RN: Hansel Feinstein Primary Care Lekita Kerekes: [name] Other Clinician: Referring Venesha Petraitis: [name] Treating Indiya Izquierdo/Extender: Rowan Blase in Treatment: 0 Wound Status Wound Number: 5 Primary Trauma, Other Etiology: Wound Location: Right, Anterior Wrist Wound Open Wounding Event: Skin Tear/Laceration Status: Date Acquired: 09/28/2020 Comorbid Cataracts, Arrhythmia, Congestive Heart Failure, Weeks Of Treatment: 0 History: Hypertension, Gout, Osteoarthritis Clustered Wound: No Photos Wound Measurements Length: (cm) 0.3 Width: (cm) 2 Depth: (cm) 0.1 Area: (cm) 0.471 Volume: (cm) 0.047 % Reduction in Area: % Reduction in Volume: Tunneling: No Undermining: No Wound Description Classification: Full Thickness Without Exposed Support Structu Exudate Amount: Small Exudate Type: Sanguinous Exudate Color: red res Foul Odor After Cleansing: No Slough/Fibrino Yes Wound Bed Granulation Amount: Medium (34-66%) Exposed Structure Necrotic Amount: Medium (34-66%) Fascia Exposed: No Necrotic Quality: Eschar Fat Layer (Subcutaneous Tissue) Exposed: Yes Tendon Exposed: No Muscle Exposed: No Joint Exposed: No Bone Exposed: No Electronic Signature(s) Signed: 10/02/2020 10:34:15 AM By: Hansel Feinstein Entered By: Hansel Feinstein on 10/01/2020 15:27:57 Ke, Rosalin B. ([id number]) -------------------------------------------------------------------------------- Vitals Details Patient Name: Currie, Sia B. Date of Service: 10/01/2020 2:30 PM Medical Record  Number: [id number] Patient Account Number: [id number] Date of Birth/Sex: 1941/04/16 (80 y.o. F) Treating RN: Hansel Feinstein Primary Care Jeffrey Voth: [name] Other Clinician: Referring Severa Jeremiah: [name] Treating Yisel Megill/Extender: Rowan Blase in Treatment: 0 Vital Signs Time Taken: 14:55 Temperature (F): 98.2 Height (in): 66 Pulse (bpm): 77 Source: Stated Respiratory Rate (breaths/min): 18 Weight (lbs): 174 Blood Pressure (mmHg): 130/54 Source: Stated Reference Range: 80 - 120 mg / dl Body Mass Index (BMI): 28.1 Electronic Signature(s) Signed: 10/02/2020 10:34:15 AM By: Hansel Feinstein Entered ByHansel Feinstein on 10/01/2020 14:59:12

## 2020-10-02 NOTE — Progress Notes (Signed)
[name], [name] ([id number]) Visit Report for 10/01/2020 Abuse/Suicide Risk Screen Details Patient Name: [name], [name] Date of Service: 10/01/2020 2:30 PM Medical Record Number: [id number] Patient Account Number: [id number] Date of Birth/Sex: 1941/05/04 (80 y.o. F) Treating RN: Hansel Feinstein Primary Care Marenda Accardi: [name] Other Clinician: Referring Andreana Klingerman: [name] Treating Seraiah Nowack/Extender: Rowan Blase in Treatment: 0 Abuse/Suicide Risk Screen Items Answer ABUSE RISK SCREEN: Has anyone close to you tried to hurt or harm you recentlyo No Do you feel uncomfortable with anyone in your familyo No Has anyone forced you do things that you didnot want to doo No Electronic Signature(s) Signed: 10/02/2020 10:34:15 AM By: Hansel Feinstein Entered By: Hansel Feinstein on 10/01/2020 15:05:16 [name], [name] ([id number]) -------------------------------------------------------------------------------- Activities of Daily Living Details Patient Name: [name], [name] Date of Service: 10/01/2020 2:30 PM Medical Record Number: [id number] Patient Account Number: [id number] Date of Birth/Sex: [birth year] (80 y.o. F) Treating RN: Hansel Feinstein Primary Care Shane Badeaux: [name] Other Clinician: Referring Teofil Maniaci: [name] Treating Milley Vining/Extender: Rowan Blase in Treatment: 0 Activities of Daily Living Items Answer Activities of Daily Living (Please select one for each item) Drive Automobile Not Able Take Medications Need Assistance Use Telephone Need Assistance Care for Appearance Need Assistance Use Toilet Need Assistance Bath / Shower Need Assistance Dress Self Need Assistance Feed Self Completely Able Walk Need Assistance Get In / Out Bed Need Assistance Housework Not Able Prepare Meals Not Able Handle Money Need Assistance Shop for Self Not Able Notes uses walker with assist PT to start with [facility] Has 24hr caregivers at home Electronic Signature(s) Signed: 10/02/2020  10:34:15 AM By: Hansel Feinstein Entered By: Hansel Feinstein on 10/01/2020 15:00:59 Marineau, Letrice B. ([id number]) -------------------------------------------------------------------------------- Education Screening Details Patient Name: [name], [name] Date of Service: 10/01/2020 2:30 PM Medical Record Number: [id number] Patient Account Number: [id number] Date of Birth/Sex: Jun 21, 1940 (80 y.o. F) Treating RN: Hansel Feinstein Primary Care Lauriana Denes: [name] Other Clinician: Referring Sven Pinheiro: [name] Treating Latroy Gaymon/Extender: Rowan Blase in Treatment: 0 Primary Learner Assessed: Patient Learning Preferences/Education Level/Primary Language Learning Preference: Explanation Highest Education Level: College or Above Preferred Language: English Cognitive Barrier Language Barrier: No Translator Needed: No Memory Deficit: No Emotional Barrier: No Cultural/Religious Beliefs Affecting Medical Care: No Physical Barrier Impaired Vision: No Impaired Hearing: No Decreased Hand dexterity: No Knowledge/Comprehension Knowledge Level: Medium Comprehension Level: Medium Ability to understand written instructions: Medium Ability to understand verbal instructions: Medium Motivation Anxiety Level: Calm Cooperation: Cooperative Education Importance: Acknowledges Need Interest in Health Problems: Asks Questions Perception: Coherent Willingness to Engage in Self-Management High Activities: Readiness to Engage in Self-Management High Activities: Electronic Signature(s) Signed: 10/02/2020 10:34:15 AM By: Hansel Feinstein Entered By: Hansel Feinstein on 10/01/2020 15:02:17 Grennan, Khalise B. ([id number]) -------------------------------------------------------------------------------- Fall Risk Assessment Details Patient Name: [name], [name] Date of Service: 10/01/2020 2:30 PM Medical Record Number: [id number] Patient Account Number: [id number] Date of Birth/Sex: February 22, 1941 (80 y.o. F) Treating RN: Hansel Feinstein Primary Care Marlin Jarrard: [name] Other Clinician: Referring Elvis Boot: [name] Treating Rabecca Birge/Extender: Rowan Blase in Treatment: 0 Fall Risk Assessment Items Have you had 2 or more falls in the last 12 monthso 0 Yes Have you had any fall that resulted in injury in the last 12 monthso 0 Yes FALLS RISK SCREEN History of falling - immediate or within 3 months 25 Yes Secondary diagnosis (Do you have 2 or more medical diagnoseso) 0 No Ambulatory aid None/bed rest/wheelchair/nurse 0 No Crutches/cane/walker 15 Yes Furniture 0 No Intravenous therapy Access/Saline/Heparin [employer]  0 No Gait/Transferring Normal/ bed rest/ wheelchair 0 Yes Weak (short steps with or without shuffle, stooped but able to lift head while walking, may 0 No seek support from furniture) Impaired (short steps with shuffle, may have difficulty arising from chair, head down, impaired 0 No balance) Mental Status Oriented to own ability 0 Yes Electronic Signature(s) Signed: 10/02/2020 10:34:15 AM By: Hansel Feinstein Entered By: Hansel Feinstein on 10/01/2020 15:05:51 [name], [name] ([id number]) -------------------------------------------------------------------------------- Foot Assessment Details Patient Name: [name], [name] Date of Service: 10/01/2020 2:30 PM Medical Record Number: [id number] Patient Account Number: [id number] Date of Birth/Sex: 06-20-1940 (80 y.o. F) Treating RN: Hansel Feinstein Primary Care Tanza Pellot: [name] Other Clinician: Referring Arista Kettlewell: [name] Treating Fabian Walder/Extender: Rowan Blase in Treatment: 0 Foot Assessment Items Site Locations + = Sensation present, - = Sensation absent, C = Callus, U = Ulcer R = Redness, W = Warmth, M = Maceration, PU = Pre-ulcerative lesion F = Fissure, S = Swelling, D = Dryness Assessment Right: Left: Other Deformity: No No Prior Foot Ulcer: No No Prior Amputation: No No Charcot Joint: No No Ambulatory Status: Ambulatory With  Help Assistance Device: Walker Gait: Surveyor, mining) Signed: 10/02/2020 10:34:15 AM By: Hansel Feinstein Entered By: Hansel Feinstein on 10/01/2020 15:12:57 [name], [name] ([id number]) -------------------------------------------------------------------------------- Nutrition Risk Screening Details Patient Name: [name], [name] Date of Service: 10/01/2020 2:30 PM Medical Record Number: [id number] Patient Account Number: [id number] Date of Birth/Sex: 09-Sep-1940 (80 y.o. F) Treating RN: Hansel Feinstein Primary Care Merrie Epler: [name] Other Clinician: Referring Sevannah Madia: [name] Treating Shayona Hibbitts/Extender: Rowan Blase in Treatment: 0 Height (in): 66 Weight (lbs): 174 Body Mass Index (BMI): 28.1 Nutrition Risk Screening Items Score Screening NUTRITION RISK SCREEN: I have an illness or condition that made me change the kind and/or amount of food I eat 0 No I eat fewer than two meals per day 0 No I eat few fruits and vegetables, or milk products 0 No I have three or more drinks of beer, liquor or wine almost every day 0 No I have tooth or mouth problems that make it hard for me to eat 0 No I don't always have enough money to buy the food I need 0 No I eat alone most of the time 0 No I take three or more different prescribed or over-the-counter drugs a day 1 Yes Without wanting to, I have lost or gained 10 pounds in the last six months 0 No I am not always physically able to shop, cook and/or feed myself 0 No Nutrition Protocols Good Risk Protocol 0 No interventions needed Moderate Risk Protocol High Risk Proctocol Risk Level: Good Risk Score: 1 Electronic Signature(s) Signed: 10/02/2020 10:34:15 AM By: Hansel Feinstein Entered ByHansel Feinstein on 10/01/2020 15:02:36

## 2020-10-05 ENCOUNTER — Inpatient Hospital Stay
Admission: EM | Admit: 2020-10-05 | Discharge: 2020-10-21 | DRG: 871 | Disposition: E | Payer: Medicare Other | Attending: Internal Medicine | Admitting: Internal Medicine

## 2020-10-05 ENCOUNTER — Ambulatory Visit: Payer: PRIVATE HEALTH INSURANCE | Admitting: Physician Assistant

## 2020-10-05 ENCOUNTER — Emergency Department: Payer: Medicare Other

## 2020-10-05 ENCOUNTER — Other Ambulatory Visit: Payer: Self-pay

## 2020-10-05 ENCOUNTER — Encounter: Payer: Self-pay | Admitting: Internal Medicine

## 2020-10-05 DIAGNOSIS — J9601 Acute respiratory failure with hypoxia: Secondary | ICD-10-CM | POA: Diagnosis not present

## 2020-10-05 DIAGNOSIS — I472 Ventricular tachycardia, unspecified: Secondary | ICD-10-CM

## 2020-10-05 DIAGNOSIS — N170 Acute kidney failure with tubular necrosis: Secondary | ICD-10-CM | POA: Diagnosis present

## 2020-10-05 DIAGNOSIS — I5030 Unspecified diastolic (congestive) heart failure: Secondary | ICD-10-CM

## 2020-10-05 DIAGNOSIS — R296 Repeated falls: Secondary | ICD-10-CM | POA: Diagnosis present

## 2020-10-05 DIAGNOSIS — Z79899 Other long term (current) drug therapy: Secondary | ICD-10-CM

## 2020-10-05 DIAGNOSIS — Z881 Allergy status to other antibiotic agents status: Secondary | ICD-10-CM

## 2020-10-05 DIAGNOSIS — Z9981 Dependence on supplemental oxygen: Secondary | ICD-10-CM

## 2020-10-05 DIAGNOSIS — I5033 Acute on chronic diastolic (congestive) heart failure: Secondary | ICD-10-CM | POA: Diagnosis present

## 2020-10-05 DIAGNOSIS — R404 Transient alteration of awareness: Secondary | ICD-10-CM | POA: Diagnosis not present

## 2020-10-05 DIAGNOSIS — Z515 Encounter for palliative care: Secondary | ICD-10-CM

## 2020-10-05 DIAGNOSIS — J441 Chronic obstructive pulmonary disease with (acute) exacerbation: Secondary | ICD-10-CM | POA: Diagnosis present

## 2020-10-05 DIAGNOSIS — J9621 Acute and chronic respiratory failure with hypoxia: Secondary | ICD-10-CM | POA: Diagnosis present

## 2020-10-05 DIAGNOSIS — Z882 Allergy status to sulfonamides status: Secondary | ICD-10-CM

## 2020-10-05 DIAGNOSIS — D631 Anemia in chronic kidney disease: Secondary | ICD-10-CM | POA: Diagnosis present

## 2020-10-05 DIAGNOSIS — N179 Acute kidney failure, unspecified: Secondary | ICD-10-CM | POA: Diagnosis not present

## 2020-10-05 DIAGNOSIS — Z7951 Long term (current) use of inhaled steroids: Secondary | ICD-10-CM

## 2020-10-05 DIAGNOSIS — I509 Heart failure, unspecified: Secondary | ICD-10-CM | POA: Diagnosis not present

## 2020-10-05 DIAGNOSIS — I130 Hypertensive heart and chronic kidney disease with heart failure and stage 1 through stage 4 chronic kidney disease, or unspecified chronic kidney disease: Secondary | ICD-10-CM | POA: Diagnosis present

## 2020-10-05 DIAGNOSIS — L03116 Cellulitis of left lower limb: Secondary | ICD-10-CM | POA: Diagnosis present

## 2020-10-05 DIAGNOSIS — M75101 Unspecified rotator cuff tear or rupture of right shoulder, not specified as traumatic: Secondary | ICD-10-CM | POA: Diagnosis present

## 2020-10-05 DIAGNOSIS — D649 Anemia, unspecified: Secondary | ICD-10-CM | POA: Diagnosis not present

## 2020-10-05 DIAGNOSIS — Z66 Do not resuscitate: Secondary | ICD-10-CM | POA: Diagnosis present

## 2020-10-05 DIAGNOSIS — Z886 Allergy status to analgesic agent status: Secondary | ICD-10-CM

## 2020-10-05 DIAGNOSIS — F05 Delirium due to known physiological condition: Secondary | ICD-10-CM | POA: Diagnosis not present

## 2020-10-05 DIAGNOSIS — A419 Sepsis, unspecified organism: Secondary | ICD-10-CM | POA: Diagnosis present

## 2020-10-05 DIAGNOSIS — I248 Other forms of acute ischemic heart disease: Secondary | ICD-10-CM | POA: Diagnosis present

## 2020-10-05 DIAGNOSIS — Z8701 Personal history of pneumonia (recurrent): Secondary | ICD-10-CM

## 2020-10-05 DIAGNOSIS — Z20822 Contact with and (suspected) exposure to covid-19: Secondary | ICD-10-CM | POA: Diagnosis present

## 2020-10-05 DIAGNOSIS — D62 Acute posthemorrhagic anemia: Secondary | ICD-10-CM | POA: Diagnosis present

## 2020-10-05 DIAGNOSIS — Z9109 Other allergy status, other than to drugs and biological substances: Secondary | ICD-10-CM

## 2020-10-05 DIAGNOSIS — F419 Anxiety disorder, unspecified: Secondary | ICD-10-CM | POA: Diagnosis present

## 2020-10-05 DIAGNOSIS — Z880 Allergy status to penicillin: Secondary | ICD-10-CM

## 2020-10-05 DIAGNOSIS — Z8616 Personal history of COVID-19: Secondary | ICD-10-CM

## 2020-10-05 DIAGNOSIS — Y92012 Bathroom of single-family (private) house as the place of occurrence of the external cause: Secondary | ICD-10-CM | POA: Diagnosis not present

## 2020-10-05 DIAGNOSIS — R6521 Severe sepsis with septic shock: Secondary | ICD-10-CM | POA: Diagnosis not present

## 2020-10-05 DIAGNOSIS — Z9181 History of falling: Secondary | ICD-10-CM

## 2020-10-05 DIAGNOSIS — Z6829 Body mass index (BMI) 29.0-29.9, adult: Secondary | ICD-10-CM

## 2020-10-05 DIAGNOSIS — S81802A Unspecified open wound, left lower leg, initial encounter: Secondary | ICD-10-CM | POA: Diagnosis present

## 2020-10-05 DIAGNOSIS — Z7901 Long term (current) use of anticoagulants: Secondary | ICD-10-CM

## 2020-10-05 DIAGNOSIS — I959 Hypotension, unspecified: Secondary | ICD-10-CM | POA: Diagnosis not present

## 2020-10-05 DIAGNOSIS — R52 Pain, unspecified: Secondary | ICD-10-CM

## 2020-10-05 DIAGNOSIS — W182XXA Fall in (into) shower or empty bathtub, initial encounter: Secondary | ICD-10-CM | POA: Diagnosis present

## 2020-10-05 DIAGNOSIS — R451 Restlessness and agitation: Secondary | ICD-10-CM | POA: Diagnosis present

## 2020-10-05 DIAGNOSIS — I480 Paroxysmal atrial fibrillation: Secondary | ICD-10-CM | POA: Diagnosis present

## 2020-10-05 DIAGNOSIS — N1832 Chronic kidney disease, stage 3b: Secondary | ICD-10-CM | POA: Diagnosis present

## 2020-10-05 DIAGNOSIS — N189 Chronic kidney disease, unspecified: Secondary | ICD-10-CM | POA: Diagnosis not present

## 2020-10-05 DIAGNOSIS — Z7189 Other specified counseling: Secondary | ICD-10-CM | POA: Diagnosis not present

## 2020-10-05 DIAGNOSIS — L089 Local infection of the skin and subcutaneous tissue, unspecified: Secondary | ICD-10-CM | POA: Diagnosis present

## 2020-10-05 DIAGNOSIS — R54 Age-related physical debility: Secondary | ICD-10-CM | POA: Diagnosis present

## 2020-10-05 DIAGNOSIS — E861 Hypovolemia: Secondary | ICD-10-CM | POA: Diagnosis present

## 2020-10-05 DIAGNOSIS — G2581 Restless legs syndrome: Secondary | ICD-10-CM | POA: Diagnosis present

## 2020-10-05 DIAGNOSIS — Z888 Allergy status to other drugs, medicaments and biological substances status: Secondary | ICD-10-CM

## 2020-10-05 DIAGNOSIS — I5031 Acute diastolic (congestive) heart failure: Secondary | ICD-10-CM | POA: Diagnosis not present

## 2020-10-05 DIAGNOSIS — M25511 Pain in right shoulder: Secondary | ICD-10-CM | POA: Diagnosis present

## 2020-10-05 DIAGNOSIS — K219 Gastro-esophageal reflux disease without esophagitis: Secondary | ICD-10-CM | POA: Diagnosis present

## 2020-10-05 DIAGNOSIS — H04123 Dry eye syndrome of bilateral lacrimal glands: Secondary | ICD-10-CM | POA: Diagnosis present

## 2020-10-05 DIAGNOSIS — Z95828 Presence of other vascular implants and grafts: Secondary | ICD-10-CM

## 2020-10-05 DIAGNOSIS — S81802S Unspecified open wound, left lower leg, sequela: Secondary | ICD-10-CM | POA: Diagnosis not present

## 2020-10-05 DIAGNOSIS — R339 Retention of urine, unspecified: Secondary | ICD-10-CM | POA: Diagnosis present

## 2020-10-05 LAB — BASIC METABOLIC PANEL
Anion gap: 11 (ref 5–15)
BUN: 43 mg/dL — ABNORMAL HIGH (ref 8–23)
CO2: 25 mmol/L (ref 22–32)
Calcium: 8.9 mg/dL (ref 8.9–10.3)
Chloride: 103 mmol/L (ref 98–111)
Creatinine, Ser: 2.67 mg/dL — ABNORMAL HIGH (ref 0.44–1.00)
GFR, Estimated: 18 mL/min — ABNORMAL LOW (ref >60)
Glucose, Bld: 125 mg/dL — ABNORMAL HIGH (ref 70–99)
Potassium: 4.9 mmol/L (ref 3.5–5.1)
Sodium: 139 mmol/L (ref 135–145)

## 2020-10-05 LAB — CBC WITH DIFFERENTIAL/PLATELET
Abs Immature Granulocytes: 0.09 10*3/uL — ABNORMAL HIGH (ref 0.00–0.07)
Basophils Absolute: 0 10*3/uL (ref 0.0–0.1)
Basophils Relative: 0 %
Eosinophils Absolute: 0.1 10*3/uL (ref 0.0–0.5)
Eosinophils Relative: 1 %
HCT: 25 % — ABNORMAL LOW (ref 36.0–46.0)
Hemoglobin: 7.4 g/dL — ABNORMAL LOW (ref 12.0–15.0)
Immature Granulocytes: 1 %
Lymphocytes Relative: 7 %
Lymphs Abs: 0.5 10*3/uL — ABNORMAL LOW (ref 0.7–4.0)
MCH: 31.6 pg (ref 26.0–34.0)
MCHC: 29.6 g/dL — ABNORMAL LOW (ref 30.0–36.0)
MCV: 106.8 fL — ABNORMAL HIGH (ref 80.0–100.0)
Monocytes Absolute: 0.5 10*3/uL (ref 0.1–1.0)
Monocytes Relative: 7 %
Neutro Abs: 5.8 10*3/uL (ref 1.7–7.7)
Neutrophils Relative %: 84 %
Platelets: 208 10*3/uL (ref 150–400)
RBC: 2.34 MIL/uL — ABNORMAL LOW (ref 3.87–5.11)
RDW: 18 % — ABNORMAL HIGH (ref 11.5–15.5)
WBC: 6.9 10*3/uL (ref 4.0–10.5)
nRBC: 1.3 % — ABNORMAL HIGH (ref 0.0–0.2)

## 2020-10-05 LAB — HEMOGLOBIN A1C
Hgb A1c MFr Bld: 5.5 % (ref 4.8–5.6)
Mean Plasma Glucose: 111.15 mg/dL

## 2020-10-05 LAB — HEMOGLOBIN AND HEMATOCRIT, BLOOD
HCT: 24.9 % — ABNORMAL LOW (ref 36.0–46.0)
HCT: 26.3 % — ABNORMAL LOW (ref 36.0–46.0)
Hemoglobin: 7.5 g/dL — ABNORMAL LOW (ref 12.0–15.0)
Hemoglobin: 8.2 g/dL — ABNORMAL LOW (ref 12.0–15.0)

## 2020-10-05 LAB — SEDIMENTATION RATE: Sed Rate: 124 mm/hr — ABNORMAL HIGH (ref 0–22)

## 2020-10-05 LAB — C-REACTIVE PROTEIN: CRP: 22.2 mg/dL — ABNORMAL HIGH (ref <1.0)

## 2020-10-05 LAB — TROPONIN I (HIGH SENSITIVITY)
Troponin I (High Sensitivity): 29 ng/L — ABNORMAL HIGH (ref <18)
Troponin I (High Sensitivity): 27 ng/L — ABNORMAL HIGH (ref <18)

## 2020-10-05 LAB — LACTIC ACID, PLASMA: Lactic Acid, Venous: 1.7 mmol/L (ref 0.5–1.9)

## 2020-10-05 LAB — PREPARE RBC (CROSSMATCH)

## 2020-10-05 LAB — BRAIN NATRIURETIC PEPTIDE: B Natriuretic Peptide: 2283.6 pg/mL — ABNORMAL HIGH (ref 0.0–100.0)

## 2020-10-05 LAB — PREALBUMIN: Prealbumin: 14.9 mg/dL — ABNORMAL LOW (ref 18–38)

## 2020-10-05 MED ORDER — SODIUM CHLORIDE 0.9% IV SOLUTION
Freq: Once | INTRAVENOUS | Status: DC
  Filled 2020-10-05: qty 250

## 2020-10-05 MED ORDER — PRAMIPEXOLE DIHYDROCHLORIDE 0.25 MG PO TABS
0.5000 mg | ORAL_TABLET | Freq: Four times a day (QID) | ORAL | Status: DC
  Administered 2020-10-05 – 2020-10-07 (×8): 0.5 mg via ORAL
  Filled 2020-10-05 (×15): qty 2

## 2020-10-05 MED ORDER — VERAPAMIL HCL ER 180 MG PO TBCR
180.0000 mg | EXTENDED_RELEASE_TABLET | Freq: Every day | ORAL | Status: DC
  Administered 2020-10-06: 180 mg via ORAL
  Filled 2020-10-05: qty 1

## 2020-10-05 MED ORDER — MAGIC MOUTHWASH
15.0000 mL | Freq: Three times a day (TID) | ORAL | Status: DC
  Filled 2020-10-05 (×4): qty 20

## 2020-10-05 MED ORDER — TRAZODONE HCL 50 MG PO TABS
50.0000 mg | ORAL_TABLET | Freq: Every evening | ORAL | Status: DC | PRN
  Administered 2020-10-05 – 2020-10-06 (×2): 50 mg via ORAL
  Filled 2020-10-05 (×2): qty 1

## 2020-10-05 MED ORDER — FLUOXETINE HCL 20 MG PO CAPS
20.0000 mg | ORAL_CAPSULE | Freq: Every day | ORAL | Status: DC
  Administered 2020-10-06 – 2020-10-08 (×3): 20 mg via ORAL
  Filled 2020-10-05 (×3): qty 1

## 2020-10-05 MED ORDER — MORPHINE SULFATE (PF) 4 MG/ML IV SOLN
4.0000 mg | Freq: Once | INTRAVENOUS | Status: AC
Start: 2020-10-05 — End: 2020-10-05
  Administered 2020-10-05: 4 mg via INTRAVENOUS
  Filled 2020-10-05: qty 1

## 2020-10-05 MED ORDER — SODIUM CHLORIDE 0.9% FLUSH
3.0000 mL | Freq: Two times a day (BID) | INTRAVENOUS | Status: DC
  Administered 2020-10-05 – 2020-10-08 (×5): 3 mL via INTRAVENOUS

## 2020-10-05 MED ORDER — PANTOPRAZOLE SODIUM 40 MG PO TBEC
40.0000 mg | DELAYED_RELEASE_TABLET | Freq: Every day | ORAL | Status: DC
  Administered 2020-10-06 – 2020-10-08 (×3): 40 mg via ORAL
  Filled 2020-10-05 (×3): qty 1

## 2020-10-05 MED ORDER — ONDANSETRON HCL 4 MG/2ML IJ SOLN: 4.0000 mg | Freq: Four times a day (QID) | INTRAMUSCULAR | Status: DC | PRN

## 2020-10-05 MED ORDER — MONTELUKAST SODIUM 10 MG PO TABS
10.0000 mg | ORAL_TABLET | Freq: Every day | ORAL | Status: DC
  Administered 2020-10-05 – 2020-10-07 (×3): 10 mg via ORAL
  Filled 2020-10-05 (×3): qty 1

## 2020-10-05 MED ORDER — SUMATRIPTAN SUCCINATE 50 MG PO TABS
50.0000 mg | ORAL_TABLET | Freq: Every day | ORAL | Status: DC | PRN
  Filled 2020-10-05: qty 1

## 2020-10-05 MED ORDER — CLINDAMYCIN HCL 150 MG PO CAPS
300.0000 mg | ORAL_CAPSULE | Freq: Four times a day (QID) | ORAL | Status: DC
  Administered 2020-10-05 – 2020-10-06 (×3): 300 mg via ORAL
  Filled 2020-10-05 (×6): qty 2

## 2020-10-05 MED ORDER — FUROSEMIDE 10 MG/ML IJ SOLN
80.0000 mg | Freq: Once | INTRAMUSCULAR | Status: AC
  Administered 2020-10-05: 80 mg via INTRAVENOUS
  Filled 2020-10-05: qty 8

## 2020-10-05 MED ORDER — ALBUTEROL SULFATE HFA 108 (90 BASE) MCG/ACT IN AERS
2.0000 | INHALATION_SPRAY | Freq: Four times a day (QID) | RESPIRATORY_TRACT | Status: DC
  Administered 2020-10-05 – 2020-10-08 (×10): 2 via RESPIRATORY_TRACT
  Filled 2020-10-05: qty 6.7

## 2020-10-05 MED ORDER — LORATADINE 10 MG PO TABS
10.0000 mg | ORAL_TABLET | Freq: Every day | ORAL | Status: DC
  Administered 2020-10-06 – 2020-10-08 (×3): 10 mg via ORAL
  Filled 2020-10-05 (×3): qty 1

## 2020-10-05 MED ORDER — FLUTICASONE PROPIONATE HFA 220 MCG/ACT IN AERO
1.0000 | INHALATION_SPRAY | Freq: Two times a day (BID) | RESPIRATORY_TRACT | Status: DC
  Administered 2020-10-05 – 2020-10-08 (×5): 1 via RESPIRATORY_TRACT
  Filled 2020-10-05 (×2): qty 12

## 2020-10-05 MED ORDER — SUCRALFATE 1 G PO TABS
1.0000 g | ORAL_TABLET | Freq: Three times a day (TID) | ORAL | Status: DC
  Administered 2020-10-05 – 2020-10-08 (×7): 1 g via ORAL
  Filled 2020-10-05 (×7): qty 1

## 2020-10-05 MED ORDER — FLECAINIDE ACETATE 50 MG PO TABS
50.0000 mg | ORAL_TABLET | Freq: Two times a day (BID) | ORAL | Status: DC
  Administered 2020-10-05 – 2020-10-07 (×4): 50 mg via ORAL
  Filled 2020-10-05 (×7): qty 1

## 2020-10-05 MED ORDER — OXYBUTYNIN CHLORIDE 5 MG PO TABS
5.0000 mg | ORAL_TABLET | Freq: Every evening | ORAL | Status: DC
  Administered 2020-10-05 – 2020-10-07 (×2): 5 mg via ORAL
  Filled 2020-10-05 (×5): qty 1

## 2020-10-05 MED ORDER — COLCHICINE 0.6 MG PO TABS
0.6000 mg | ORAL_TABLET | Freq: Every day | ORAL | Status: DC | PRN
  Filled 2020-10-05: qty 1

## 2020-10-05 MED ORDER — ONDANSETRON HCL 4 MG PO TABS: 4.0000 mg | ORAL_TABLET | Freq: Four times a day (QID) | ORAL | Status: DC | PRN

## 2020-10-05 MED ORDER — CARBAMAZEPINE ER 100 MG PO TB12
100.0000 mg | ORAL_TABLET | Freq: Two times a day (BID) | ORAL | Status: DC | PRN
  Filled 2020-10-05: qty 1

## 2020-10-05 MED ORDER — ONDANSETRON HCL 4 MG/2ML IJ SOLN
4.0000 mg | Freq: Once | INTRAMUSCULAR | Status: AC
  Administered 2020-10-05: 4 mg via INTRAVENOUS
  Filled 2020-10-05: qty 2

## 2020-10-05 MED ORDER — TRAMADOL HCL 50 MG PO TABS
50.0000 mg | ORAL_TABLET | Freq: Four times a day (QID) | ORAL | Status: DC | PRN
  Administered 2020-10-05 (×2): 50 mg via ORAL
  Filled 2020-10-05 (×2): qty 1

## 2020-10-05 MED ORDER — FUROSEMIDE 10 MG/ML IJ SOLN
40.0000 mg | Freq: Every day | INTRAMUSCULAR | Status: DC
  Administered 2020-10-05 – 2020-10-08 (×4): 40 mg via INTRAVENOUS
  Filled 2020-10-05 (×4): qty 4

## 2020-10-05 MED ORDER — SALINE SPRAY 0.65 % NA SOLN
1.0000 | NASAL | Status: DC | PRN
  Filled 2020-10-05: qty 44

## 2020-10-05 MED ORDER — FLUTICASONE PROPIONATE 50 MCG/ACT NA SUSP
2.0000 | Freq: Two times a day (BID) | NASAL | Status: DC
  Administered 2020-10-05 – 2020-10-08 (×5): 2 via NASAL
  Filled 2020-10-05 (×2): qty 16

## 2020-10-05 MED ORDER — VANCOMYCIN HCL IN DEXTROSE 1-5 GM/200ML-% IV SOLN
1000.0000 mg | Freq: Once | INTRAVENOUS | Status: AC
  Administered 2020-10-05: 1000 mg via INTRAVENOUS
  Filled 2020-10-05: qty 200

## 2020-10-05 MED ORDER — ACETAMINOPHEN 500 MG PO TABS
1000.0000 mg | ORAL_TABLET | Freq: Three times a day (TID) | ORAL | Status: DC | PRN
  Administered 2020-10-05 – 2020-10-07 (×2): 1000 mg via ORAL
  Filled 2020-10-05 (×2): qty 2

## 2020-10-05 MED ORDER — MAGIC MOUTHWASH
15.0000 mL | Freq: Three times a day (TID) | ORAL | Status: DC
  Administered 2020-10-06 – 2020-10-08 (×6): 15 mL via ORAL
  Filled 2020-10-05 (×13): qty 20

## 2020-10-05 MED ORDER — ESTRADIOL 1 MG PO TABS
1.5000 mg | ORAL_TABLET | Freq: Every day | ORAL | Status: DC
  Administered 2020-10-06 – 2020-10-08 (×3): 1.5 mg via ORAL
  Filled 2020-10-05 (×3): qty 1.5

## 2020-10-05 MED ORDER — TIZANIDINE HCL 2 MG PO TABS
2.0000 mg | ORAL_TABLET | Freq: Three times a day (TID) | ORAL | Status: DC | PRN
  Administered 2020-10-06 (×2): 2 mg via ORAL
  Filled 2020-10-05 (×4): qty 1

## 2020-10-05 MED ORDER — SODIUM CHLORIDE 0.9% FLUSH: 3.0000 mL | INTRAVENOUS | Status: DC | PRN

## 2020-10-05 MED ORDER — ALBUTEROL SULFATE HFA 108 (90 BASE) MCG/ACT IN AERS
2.0000 | INHALATION_SPRAY | RESPIRATORY_TRACT | Status: DC | PRN
  Filled 2020-10-05: qty 6.7

## 2020-10-05 MED ORDER — SODIUM CHLORIDE 0.9 % IV SOLN: 250.0000 mL | INTRAVENOUS | Status: DC | PRN

## 2020-10-05 MED ORDER — ALPRAZOLAM 0.25 MG PO TABS
0.2500 mg | ORAL_TABLET | Freq: Three times a day (TID) | ORAL | Status: DC | PRN
  Administered 2020-10-08: 0.25 mg via ORAL
  Filled 2020-10-05: qty 1

## 2020-10-05 NOTE — H&P (Addendum)
Chief Complaint: Worsening shortness of breath at home and hypoxia as reported by Home health nurses HPI:  [name] is an 80 y.o. female with medical history significant for chronic respiratory failure on 1 to 2 L/min of oxygen at home,atrial fibrillation on chronic anticoagulation therapy with xarelto, history of CHF(LVEF of 55-60% in 02/2020), hypertension, recent admission for COVID 19 barely 2 weeks ago.  She apparently fell at home about 2 weeks ago and sustained a laceration injury to her shin with significant bleeding reported. She has since followed up with wound care clinic and has home health nursing visiting her every day.She was seen by Home health nurses today and was reported to be short of breath from her baseline and was noted to be hypoxic on her pulse oximeter. PCP was contacted and patient advised to be brought to the ED for further management. Patient was noted on workup to have AKI and acute anemia. She apparently has been on bactrim for treatment on lower extremity woundfor several days now. She also admitted to have bled significant when she sustained injury to LLE for which reason xarelto was held by PCP for a few days now. She denies any further active bleeding and denies any BRB per rectum.She however admits to some dark stool.No prior history of GI blood loss.She denies fever or chills  Past Medical History:  Diagnosis Date  . A-fib (HCC) 2012, 2014  . Anxiety   . Arrhythmia    A-fib  . Bursitis    hips  . Chronic diastolic (congestive) heart failure (HCC)   . Concussion   . Concussion July, 28, 2014  . GERD (gastroesophageal reflux disease)   . Hypertension   . Palpitations   . Pneumonia 2012    Past Surgical History:  Procedure Laterality Date  . ABDOMINAL SURGERY  2008   abdominal muscle mesh insert  . Arm surgery  2015   Pin implanted   . CATARACT EXTRACTION Right August 30, 2013  . CATARACT EXTRACTION Left August 09, 2013  . COLONOSCOPY    .  ESOPHAGOGASTRODUODENOSCOPY    . HERNIA REPAIR Left 1970  . HERNIA REPAIR  2015   Dr. Malissa Hippo  . KNEE SURGERY Right 1980  . NOSE SURGERY    . SHOULDER SURGERY Right 2000  . TOTAL ABDOMINAL HYSTERECTOMY  1995    Family History  Adopted: Yes  Problem Relation Age of Onset  . Dementia Neg Hx        ADOPTED   Social History:  reports that she has never smoked. She has quit using smokeless tobacco. She reports that she does not drink alcohol and does not use drugs.  Allergies:  Allergies  Allergen Reactions  . Penicillins Hives    Did it involve swelling of the face/tongue/throat, SOB, or low BP? NO Did it involve sudden or severe rash/hives, skin peeling, or any reaction on the inside of your mouth or nose? Yes. Pt reports severe hives (blistering) from neck to her stomach Did you need to seek medical attention at a hospital or doctor's office? Unknown When did it last happen?>36 years ago If all above answers are "NO", may proceed with cephalosporin use.  . Amlodipine Rash  . Clarithromycin Rash  . Minocycline Other (See Comments)    Onset 04/10/2001.  / unknown reaction  . Propoxyphene Hives    Onset 04/10/2001.   . Ace Inhibitors Cough  . Norvasc [Amlodipine Besylate] Hives  . Vicodin [Hydrocodone-Acetaminophen] Hives    Hives  occur after 5 doses   . Betadine [Povidone Iodine] Rash and Other (See Comments)    blisters  . Iodine Rash  . Sulfa Antibiotics Diarrhea and Nausea Only    (Not in a hospital admission)   Results for orders placed or performed during the hospital encounter of 11-03-20 (from the past 48 hour(s))  Basic metabolic panel     Status: Abnormal   Collection Time: 2020/11/03 11:45 AM  Result Value Ref Range   Sodium 139 135 - 145 mmol/L   Potassium 4.9 3.5 - 5.1 mmol/L   Chloride 103 98 - 111 mmol/L   CO2 25 22 - 32 mmol/L   Glucose, Bld 125 (H) 70 - 99 mg/dL    Comment: Glucose reference range applies only to samples taken after fasting for at  least 8 hours.   BUN 43 (H) 8 - 23 mg/dL   Creatinine, Ser 7.62 (H) 0.44 - 1.00 mg/dL   Calcium 8.9 8.9 - 83.1 mg/dL   GFR, Estimated 18 (L) >60 mL/min    Comment: (NOTE) Calculated using the CKD-EPI Creatinine Equation (2021)    Anion gap 11 5 - 15    Comment: Performed at [facility], [address] [place], [place] [address]  CBC with Differential     Status: Abnormal   Collection Time: 03-Nov-2020 11:45 AM  Result Value Ref Range   WBC 6.9 4.0 - 10.5 K/uL   RBC 2.34 (L) 3.87 - 5.11 MIL/uL   Hemoglobin 7.4 (L) 12.0 - 15.0 g/dL   HCT 60.7 (L) 37.1 - 06.2 %   MCV 106.8 (H) 80.0 - 100.0 fL   MCH 31.6 26.0 - 34.0 pg   MCHC 29.6 (L) 30.0 - 36.0 g/dL   RDW 69.4 (H) 85.4 - 62.7 %   Platelets 208 150 - 400 K/uL   nRBC 1.3 (H) 0.0 - 0.2 %   Neutrophils Relative % 84 %   Neutro Abs 5.8 1.7 - 7.7 K/uL   Lymphocytes Relative 7 %   Lymphs Abs 0.5 (L) 0.7 - 4.0 K/uL   Monocytes Relative 7 %   Monocytes Absolute 0.5 0.1 - 1.0 K/uL   Eosinophils Relative 1 %   Eosinophils Absolute 0.1 0.0 - 0.5 K/uL   Basophils Relative 0 %   Basophils Absolute 0.0 0.0 - 0.1 K/uL   Immature Granulocytes 1 %   Abs Immature Granulocytes 0.09 (H) 0.00 - 0.07 K/uL    Comment: Performed at [facility], [address] [place], [place] [address]  Troponin I (High Sensitivity)     Status: Abnormal   Collection Time: 11/03/2020 11:45 AM  Result Value Ref Range   Troponin I (High Sensitivity) 29 (H) <18 ng/L    Comment: (NOTE) Elevated high sensitivity troponin I (hsTnI) values and significant  changes across serial measurements may suggest ACS but many other  chronic and acute conditions are known to elevate hsTnI results.  Refer to the "Links" section for chest pain algorithms and additional  guidance. Performed at [facility], [address] [place], [place] [address]   Brain natriuretic peptide     Status: Abnormal   Collection Time: Nov 03, 2020 11:45 AM  Result Value Ref  Range   B Natriuretic Peptide 2,283.6 (H) 0.0 - 100.0 pg/mL    Comment: Performed at [facility], [address] [place], [place] [address]  Lactic acid, plasma     Status: None   Collection Time: 2020/11/03 12:48 PM  Result Value Ref  Range   Lactic Acid, Venous 1.7 0.5 - 1.9 mmol/L    Comment: Performed at [facility], [address] [place], [place] [id number]  Prepare RBC (crossmatch)     Status: None (Preliminary result)   Collection Time: 10/12/2020  2:54 PM  Result Value Ref Range   Order Confirmation PENDING    DG Tibia/Fibula Left  Result Date: 09/29/2020 CLINICAL DATA:  Wound infection. EXAM: LEFT TIBIA AND FIBULA - 2 VIEW COMPARISON:  06/11/2019 FINDINGS: The knee and ankle joints are maintained. There is a bandage over the midportion of the left leg. There appears to be gas in the soft tissues which may be related to an open wound. I do not see any plain film findings for osteomyelitis. IMPRESSION: 1. Gas in the soft tissues may be related to an open wound. 2. No plain film findings for osteomyelitis. Electronically Signed   By: Rudie MeyerP.  Gallerani M.D.   On: 10/07/2020 12:54   DG Chest Portable 1 View  Result Date: 10/20/2020 CLINICAL DATA:  Shortness of breath, low O2 sat. EXAM: PORTABLE CHEST 1 VIEW COMPARISON:  09/14/2020 and CT chest 03/14/2020. FINDINGS: Trachea is midline. Heart is enlarged. There is patchy bilateral airspace opacification with slight coarsening and bronchiectasis. Blunting of the left costophrenic angle. IMPRESSION: 1. Patchy bilateral airspace opacification is likely due to pneumonia. Difficult to exclude edema. 2. Findings appear to be superimposed on a component of chronic interstitial lung disease. 3. Blunting of the left costophrenic angle may be due to pleural fluid. Electronically Signed   By: Leanna BattlesMelinda  Blietz M.D.   On: 09/21/2020 12:54    Review of Systems  Constitutional: Positive for activity change.  HENT: Negative.   Respiratory:  Positive for shortness of breath. Negative for choking and chest tightness.   Cardiovascular: Negative for chest pain.  Gastrointestinal: Negative for abdominal distention, abdominal pain and blood in stool.  Endocrine: Negative.   Genitourinary: Negative.  Negative for difficulty urinating.  Skin: Positive for wound. Negative for color change.  Neurological: Negative.   Hematological: Negative.   Psychiatric/Behavioral: Negative.     Blood pressure 120/63, pulse 72, temperature (!) 97.4 F (36.3 C), temperature source Oral, resp. rate (!) 21, height 5\' 6"  (1.676 m), weight 79.4 kg, SpO2 92 %. Physical Exam Vitals and nursing note reviewed.  Constitutional:      Appearance: She is obese. She is ill-appearing.  HENT:     Head: Normocephalic and atraumatic.  Eyes:     Extraocular Movements: Extraocular movements intact.  Cardiovascular:     Rate and Rhythm: Normal rate and regular rhythm.  Pulmonary:     Effort: Pulmonary effort is normal.     Breath sounds: Examination of the right-lower field reveals decreased breath sounds. Examination of the left-lower field reveals decreased breath sounds. Decreased breath sounds present.  Abdominal:     General: Bowel sounds are normal.     Palpations: Abdomen is soft.  Skin:    General: Skin is warm.     Comments: Wound on Left shin with surrounding echymosis , not warm to touch and knee abrasion injury also noted.  Neurological:     General: No focal deficit present.     Mental Status: She is alert and oriented to person, place, and time.  Psychiatric:        Behavior: Behavior normal.      Assessment/Plan Acute symptomatic Anemia: Acute drop in hematocrit compared to baseline hemoglobin of 11.  Source of anemia likely from  bleeding wound versus GI blood loss in etiology.  Serial H&H monitoring advised.  Will transfuse with PRBC x1 due to symptomatic anemia and evidence of CHF.GI consult if source of bleeding is confirmed GI in  etiology  Acute CHF: Known preserved left ventricular ejection fraction.  Last echo showed left ventricular ejection fraction of 65 to 70% with grade 2 diastolic dysfunction.  BNP at his admission is medically elevated with evidence of pulmonary vascular congestion.  Patient will be optimized with Lasix with caution due to worsening renal function.  Acute Kidney Injury: Most likely drug-induced from Bactrim.  LLE Wound: Present on admission.  Secondary to trauma from fall.  Patient developed laceration injury to the shin and knee.  Wound care following up with patient at home.  No obvious surrounding evidence of cellulitis except for ecchymosis.  Recent COVID 19 : Chest x-ray findings most likely sequelae from recent COVID infection.  Patient is on 1 L/min of oxygen at home at this time.  History of frequent falls at home: PT and OT to assess and advise on placement.  Paroxysmal atrial fibrillation: Rate controlled on current regiment including flecainide and verapamil.  Eliquis was held due to acute anemia and increased risk of bleeding diathesis.  May resume when hemoglobin stable.  Restless leg syndrome: Continue with pramipexole.    Lilia Pro, MD 10/15/2020, 3:04 PM

## 2020-10-05 NOTE — Consult Note (Signed)
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 Patient has been followed by Allen Derry, PA-C in the [facility] since January 2021 for this (these) injuries and he last saw her on Thursday of last week, on 10/01/20. Measurements were taken at that visit and the POC was not changed from that in place previously.  This admission is for an unrelated issue, shortness of breath, identified during the [facility] visit. Wound type: trauma Pressure Injury POA: N/A Measurement: From [facility] Center on 10/01/20: Left LE:  14cm x 10cm x 0.6cm Left knee and right wrist are healing, full thickness wounds. Wound bed: Open, red, moist Drainage (amount, consistency, odor) moderate amount serous to serosanguinous Periwound: intact Dressing procedure/placement/frequency: Two smaller wounds at lefty knee and right wrist are being treated with nonadherent antimicrobial gauze (xeroform) and I will continue that POC, changing daily instead of three times weekly while in house. The left LE wound is being treated with a silver hydrofiber and also with a collagen dressing; we do not carry the collagen dressing in house, but I will continue the silver and top with an ABD pad. Again, instead of three times weekly changes, we will perform changes daily. If needed for adherence, the silver hydrofiber can be moistened with NS prior to removal. Rather than a compression bandaging system (3-layer, Profore Light), we will use a dry boot (Kerlix roll gauze wrapped from just below toes to just below knee, heel inclusive and top with a 6-inch ACE bandage. The patient's LE is to be elevated and the heel floated to avoid pressure injury. A sacral silicone foam dressing is to be placed for PI prevention in this area.  Patient is to resume care by the [facility] and follow as previously scheduled by the outpatient [facility] and Mr. Allen Derry, III upon discharge.  WOC nursing team will not follow, but  will remain available to this patient, the nursing and medical teams.  Please re-consult if needed. Thanks, Ladona Mow, MSN, RN, GNP, [facility]  Pager# [phone]
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
## 2020-10-05 NOTE — ED Triage Notes (Signed)
BIB EMS for SOB. Patient was seen recently for knee debriedment. Home health RN came to residence today to redress knee bandage and found patient to be SOB with O2 sat of 85% on RA. EMS advised 12lead showed AFIB which is baseline for pt.

## 2020-10-05 NOTE — ED Notes (Addendum)
MD at bedside.   Pt pulse ox 55% on RA initially. Placed on [place] at 6lpm and improved to 85%. Moved to NRB and improved to 99% on oxygen.  Pt advised she wears 1lpm via [place] at home normally.  Pt complaint of severe pain in the leg that the home health care RN re-dressed today and has not been prescribed any pain medication for the leg pain.   Labs drawn and sent down to hold for orders.

## 2020-10-05 NOTE — ED Notes (Signed)
MD unwrapped dressing.

## 2020-10-06 ENCOUNTER — Inpatient Hospital Stay: Payer: Medicare Other

## 2020-10-06 ENCOUNTER — Inpatient Hospital Stay: Payer: Self-pay

## 2020-10-06 DIAGNOSIS — D62 Acute posthemorrhagic anemia: Secondary | ICD-10-CM

## 2020-10-06 DIAGNOSIS — I959 Hypotension, unspecified: Secondary | ICD-10-CM | POA: Diagnosis not present

## 2020-10-06 DIAGNOSIS — J9601 Acute respiratory failure with hypoxia: Secondary | ICD-10-CM

## 2020-10-06 DIAGNOSIS — I472 Ventricular tachycardia, unspecified: Secondary | ICD-10-CM

## 2020-10-06 DIAGNOSIS — N179 Acute kidney failure, unspecified: Secondary | ICD-10-CM | POA: Diagnosis not present

## 2020-10-06 DIAGNOSIS — D649 Anemia, unspecified: Secondary | ICD-10-CM | POA: Diagnosis not present

## 2020-10-06 DIAGNOSIS — R6521 Severe sepsis with septic shock: Secondary | ICD-10-CM

## 2020-10-06 DIAGNOSIS — S81802S Unspecified open wound, left lower leg, sequela: Secondary | ICD-10-CM

## 2020-10-06 DIAGNOSIS — A419 Sepsis, unspecified organism: Principal | ICD-10-CM

## 2020-10-06 DIAGNOSIS — I480 Paroxysmal atrial fibrillation: Secondary | ICD-10-CM | POA: Diagnosis not present

## 2020-10-06 DIAGNOSIS — S81802A Unspecified open wound, left lower leg, initial encounter: Secondary | ICD-10-CM

## 2020-10-06 LAB — URINALYSIS, COMPLETE (UACMP) WITH MICROSCOPIC
Bilirubin Urine: NEGATIVE
Glucose, UA: NEGATIVE mg/dL
Hgb urine dipstick: NEGATIVE
Ketones, ur: NEGATIVE mg/dL
Leukocytes,Ua: NEGATIVE
Nitrite: NEGATIVE
Protein, ur: NEGATIVE mg/dL
Specific Gravity, Urine: 1.01 (ref 1.005–1.030)
pH: 8 (ref 5.0–8.0)

## 2020-10-06 LAB — PROCALCITONIN: Procalcitonin: 0.12 ng/mL

## 2020-10-06 LAB — BASIC METABOLIC PANEL
Anion gap: 8 (ref 5–15)
BUN: 41 mg/dL — ABNORMAL HIGH (ref 8–23)
CO2: 27 mmol/L (ref 22–32)
Calcium: 8.7 mg/dL — ABNORMAL LOW (ref 8.9–10.3)
Chloride: 103 mmol/L (ref 98–111)
Creatinine, Ser: 2.62 mg/dL — ABNORMAL HIGH (ref 0.44–1.00)
GFR, Estimated: 18 mL/min — ABNORMAL LOW (ref >60)
Glucose, Bld: 115 mg/dL — ABNORMAL HIGH (ref 70–99)
Potassium: 4.6 mmol/L (ref 3.5–5.1)
Sodium: 138 mmol/L (ref 135–145)

## 2020-10-06 LAB — BLOOD GAS, ARTERIAL
Acid-Base Excess: 3.3 mmol/L — ABNORMAL HIGH (ref 0.0–2.0)
Bicarbonate: 26.7 mmol/L (ref 20.0–28.0)
FIO2: 0.44
O2 Saturation: 89.8 %
Patient temperature: 37
pCO2 arterial: 35 mmHg (ref 32.0–48.0)
pH, Arterial: 7.49 — ABNORMAL HIGH (ref 7.350–7.450)
pO2, Arterial: 53 mmHg — ABNORMAL LOW (ref 83.0–108.0)

## 2020-10-06 LAB — CBC WITH DIFFERENTIAL/PLATELET
Abs Immature Granulocytes: 0.04 10*3/uL (ref 0.00–0.07)
Basophils Absolute: 0 10*3/uL (ref 0.0–0.1)
Basophils Relative: 0 %
Eosinophils Absolute: 0.1 10*3/uL (ref 0.0–0.5)
Eosinophils Relative: 3 %
HCT: 25 % — ABNORMAL LOW (ref 36.0–46.0)
Hemoglobin: 7.9 g/dL — ABNORMAL LOW (ref 12.0–15.0)
Immature Granulocytes: 1 %
Lymphocytes Relative: 7 %
Lymphs Abs: 0.4 10*3/uL — ABNORMAL LOW (ref 0.7–4.0)
MCH: 31.9 pg (ref 26.0–34.0)
MCHC: 31.6 g/dL (ref 30.0–36.0)
MCV: 100.8 fL — ABNORMAL HIGH (ref 80.0–100.0)
Monocytes Absolute: 0.4 10*3/uL (ref 0.1–1.0)
Monocytes Relative: 7 %
Neutro Abs: 4.4 10*3/uL (ref 1.7–7.7)
Neutrophils Relative %: 82 %
Platelets: 181 10*3/uL (ref 150–400)
RBC: 2.48 MIL/uL — ABNORMAL LOW (ref 3.87–5.11)
RDW: 19.9 % — ABNORMAL HIGH (ref 11.5–15.5)
WBC: 5.4 10*3/uL (ref 4.0–10.5)
nRBC: 0.9 % — ABNORMAL HIGH (ref 0.0–0.2)

## 2020-10-06 LAB — COMPREHENSIVE METABOLIC PANEL
ALT: 23 U/L (ref 0–44)
AST: 25 U/L (ref 15–41)
Albumin: 2.5 g/dL — ABNORMAL LOW (ref 3.5–5.0)
Alkaline Phosphatase: 69 U/L (ref 38–126)
Anion gap: 11 (ref 5–15)
BUN: 44 mg/dL — ABNORMAL HIGH (ref 8–23)
CO2: 25 mmol/L (ref 22–32)
Calcium: 8.8 mg/dL — ABNORMAL LOW (ref 8.9–10.3)
Chloride: 100 mmol/L (ref 98–111)
Creatinine, Ser: 2.86 mg/dL — ABNORMAL HIGH (ref 0.44–1.00)
GFR, Estimated: 16 mL/min — ABNORMAL LOW (ref >60)
Glucose, Bld: 119 mg/dL — ABNORMAL HIGH (ref 70–99)
Potassium: 5 mmol/L (ref 3.5–5.1)
Sodium: 136 mmol/L (ref 135–145)
Total Bilirubin: 0.9 mg/dL (ref 0.3–1.2)
Total Protein: 5.3 g/dL — ABNORMAL LOW (ref 6.5–8.1)

## 2020-10-06 LAB — TROPONIN I (HIGH SENSITIVITY)
Troponin I (High Sensitivity): 18 ng/L — ABNORMAL HIGH (ref <18)
Troponin I (High Sensitivity): 18 ng/L — ABNORMAL HIGH (ref <18)

## 2020-10-06 LAB — MRSA PCR SCREENING: MRSA by PCR: NEGATIVE

## 2020-10-06 LAB — SARS CORONAVIRUS 2 (TAT 6-24 HRS): SARS Coronavirus 2: NEGATIVE

## 2020-10-06 LAB — MAGNESIUM: Magnesium: 3.1 mg/dL — ABNORMAL HIGH (ref 1.7–2.4)

## 2020-10-06 LAB — HEMOGLOBIN AND HEMATOCRIT, BLOOD
HCT: 24.5 % — ABNORMAL LOW (ref 36.0–46.0)
Hemoglobin: 7.8 g/dL — ABNORMAL LOW (ref 12.0–15.0)

## 2020-10-06 LAB — GLUCOSE, CAPILLARY: Glucose-Capillary: 129 mg/dL — ABNORMAL HIGH (ref 70–99)

## 2020-10-06 LAB — HEMOGLOBIN: Hemoglobin: 7.5 g/dL — ABNORMAL LOW (ref 12.0–15.0)

## 2020-10-06 LAB — LACTIC ACID, PLASMA
Lactic Acid, Venous: 2.2 mmol/L (ref 0.5–1.9)
Lactic Acid, Venous: 1.1 mmol/L (ref 0.5–1.9)

## 2020-10-06 LAB — PREPARE RBC (CROSSMATCH)

## 2020-10-06 MED ORDER — VANCOMYCIN HCL 1000 MG/200ML IV SOLN
1000.0000 mg | INTRAVENOUS | Status: DC
  Administered 2020-10-07: 1000 mg via INTRAVENOUS
  Filled 2020-10-06: qty 200

## 2020-10-06 MED ORDER — SODIUM CHLORIDE 0.9 % IV BOLUS
250.0000 mL | Freq: Once | INTRAVENOUS | Status: AC
  Administered 2020-10-06: 250 mL via INTRAVENOUS

## 2020-10-06 MED ORDER — PHENYLEPHRINE HCL-NACL 10-0.9 MG/250ML-% IV SOLN
0.0000 ug/min | INTRAVENOUS | Status: DC
  Administered 2020-10-06 (×2): 5 ug/min via INTRAVENOUS
  Filled 2020-10-06 (×2): qty 250

## 2020-10-06 MED ORDER — PHENYLEPHRINE CONCENTRATED 100MG/250ML (0.4 MG/ML) INFUSION SIMPLE
0.0000 ug/min | INTRAVENOUS | Status: DC
  Administered 2020-10-06: 5 ug/min via INTRAVENOUS
  Filled 2020-10-06: qty 250

## 2020-10-06 MED ORDER — SODIUM CHLORIDE 0.9% FLUSH
10.0000 mL | Freq: Two times a day (BID) | INTRAVENOUS | Status: DC
  Administered 2020-10-06 – 2020-10-08 (×4): 10 mL

## 2020-10-06 MED ORDER — SODIUM CHLORIDE 0.9 % IV SOLN
2.0000 g | INTRAVENOUS | Status: DC
  Administered 2020-10-06 – 2020-10-07 (×2): 2 g via INTRAVENOUS
  Filled 2020-10-06 (×3): qty 2

## 2020-10-06 MED ORDER — HYDROMORPHONE HCL 1 MG/ML IJ SOLN
0.5000 mg | Freq: Once | INTRAMUSCULAR | Status: AC
Start: 2020-10-06 — End: 2020-10-06
  Administered 2020-10-06: 0.5 mg via INTRAVENOUS
  Filled 2020-10-06: qty 1

## 2020-10-06 MED ORDER — SODIUM CHLORIDE 0.9% IV SOLUTION
Freq: Once | INTRAVENOUS | Status: AC
Start: 2020-10-06 — End: 2020-10-06

## 2020-10-06 MED ORDER — CHLORHEXIDINE GLUCONATE CLOTH 2 % EX PADS
6.0000 | MEDICATED_PAD | Freq: Every day | CUTANEOUS | Status: DC
  Administered 2020-10-06 – 2020-10-07 (×2): 6 via TOPICAL

## 2020-10-06 MED ORDER — SODIUM CHLORIDE 0.9% FLUSH: 10.0000 mL | INTRAVENOUS | Status: DC | PRN

## 2020-10-06 MED ORDER — OXYCODONE HCL 5 MG PO TABS
5.0000 mg | ORAL_TABLET | Freq: Four times a day (QID) | ORAL | Status: DC | PRN
  Administered 2020-10-07 (×2): 5 mg via ORAL
  Filled 2020-10-06 (×2): qty 1

## 2020-10-06 NOTE — Progress Notes (Signed)
Tele informed Clinical research associate again about another run of vtach. Radiology just left patients room. Writer assessed tele Leads and patient and patient noted to be confused. Alert to self and place only. Family member at beside and notice she was starting to become more confused as well. Vitals checked (see flowsheets), MD aware and came to bedside (see orders). BP trending downwards per automatic machine and manual, mental status changing. Charge nurse aware and informed CCU rapid nurse.   MD gave orders to transfer patient to CCU stepdown room 4. Report given to nurse. Patient transported with all belongings by Clinical research associate and Optometrist. 6L of oxygen in place and family member remains at bedside. Patient remains confused.

## 2020-10-06 NOTE — Progress Notes (Signed)
Pharmacy Antibiotic Note  [name] is a 80 y.o. female admitted on 10/14/20. Pharmacy has been consulted for vancomycin and aztreonam dosing. Per chart review, patient has received cefepime in the past. Provider in agreement with changing aztreonam to cefepime.  Plan: Vancomycin 1000 mg IV q48h  Goal AUC 400-550 Expected AUC: 478 SCr used: 2.62  Cefepime 2 g IV q24h   Height: 5\' 6"  (167.6 cm) Weight: 79.9 kg (176 lb 3.2 oz) IBW/kg (Calculated) : 59.3  Temp (24hrs), Avg:98 F (36.7 C), Min:97.8 F (36.6 C), Max:98.1 F (36.7 C)  Recent Labs  Lab 14-Oct-2020 1145 10/14/2020 1248 10/06/20 0357  WBC 6.9  --   --   CREATININE 2.67*  --  2.62*  LATICACIDVEN  --  1.7  --     Estimated Creatinine Clearance: 18.9 mL/min (A) (by C-G formula based on SCr of 2.62 mg/dL (H)).    Allergies  Allergen Reactions  . Penicillins Hives    Did it involve swelling of the face/tongue/throat, SOB, or low BP? NO Did it involve sudden or severe rash/hives, skin peeling, or any reaction on the inside of your mouth or nose? Yes. Pt reports severe hives (blistering) from neck to her stomach Did you need to seek medical attention at a hospital or doctor's office? Unknown When did it last happen?>36 years ago If all above answers are "NO", may proceed with cephalosporin use.  . Amlodipine Rash  . Clarithromycin Rash  . Minocycline Other (See Comments)    Onset 04/10/2001.  / unknown reaction  . Propoxyphene Hives    Onset 04/10/2001.   . Ace Inhibitors Cough  . Norvasc [Amlodipine Besylate] Hives  . Vicodin [Hydrocodone-Acetaminophen] Hives    Hives occur after 5 doses   . Betadine [Povidone Iodine] Rash and Other (See Comments)    blisters  . Iodine Rash  . Sulfa Antibiotics Diarrhea and Nausea Only    Antimicrobials this admission: Vancomycin 5/16 >> Cefepime 5/16 >>  Microbiology results: 5/17 BCx: pending   Thank you for allowing pharmacy to be a part of this patient's  care.  6/17, PharmD 10/06/2020 1:32 PM
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
## 2020-10-06 NOTE — Plan of Care (Signed)

## 2020-10-06 NOTE — Progress Notes (Signed)
OT Cancellation Note  Patient Details Name: [name] MRN: [id number] DOB: 09/01/1940   Cancelled Treatment:    Reason Eval/Treat Not Completed: Medical issues which prohibited therapy. Consult received, chart reviewed. Pt noted with transfer to ICU 2/2 possible ventricular tachycardia, blood pressure is low in the 70s, and increasing oxygen requirements. Will sign off. Will require new OT consult once pt is medically appropriate for therapy.   Wynona Canes, MPH, MS, OTR/L ascom [phone] 10/06/20, 1:23 PM

## 2020-10-06 NOTE — Progress Notes (Signed)
Patient ID: [name], female   DOB: 15-Jun-1940, 80 y.o.   MRN: [id number] [facility] PROGRESS NOTE  Ann Fellersnn B Pflum WUJ:[id number] DOB: 15-Jun-1940 DOA: 09/27/2020 PCP: Danella PentonMiller, Mark F, MD  HPI/Subjective: Patient admitted with a left lower extremity wound and bleeding.  High sedimentation rate and started on antibiotics.  Also had acute kidney injury.  Called back to the bedside with what look like ventricular tachycardia seen on the monitor.  Blood pressure is low in the 70s.  Patient had increasing oxygen requirements.  We will transfer over to the stepdown unit.  Patient with right shoulder pain also.  Objective: Vitals:   10/06/20 0815 10/06/20 1121  BP: (!) 103/50 (!) 91/57  Pulse: 70 68  Resp:    Temp: 97.8 F (36.6 C) 98 F (36.7 C)  SpO2: 93% (!) 88%    Intake/Output Summary (Last 24 hours) at 10/06/2020 1246 Last data filed at 10/06/2020 0950 Gross per 24 hour  Intake 1699.69 ml  Output 1000 ml  Net 699.69 ml   Filed Weights   10/04/2020 1150 10/06/20 0504  Weight: 79.4 kg 79.9 kg    ROS: Review of Systems  Respiratory: Positive for shortness of breath.   Cardiovascular: Negative for chest pain.  Gastrointestinal: Negative for abdominal pain.  Musculoskeletal: Positive for joint pain.   Exam: Physical Exam HENT:     Head: Normocephalic.     Mouth/Throat:     Pharynx: No oropharyngeal exudate.  Eyes:     General: Lids are normal.     Conjunctiva/sclera: Conjunctivae normal.  Cardiovascular:     Rate and Rhythm: Normal rate. Rhythm irregularly irregular.     Heart sounds: Normal heart sounds, S1 normal and S2 normal.  Pulmonary:     Breath sounds: Examination of the right-lower field reveals decreased breath sounds and rales. Examination of the left-lower field reveals decreased breath sounds and rales. Decreased breath sounds and rales present. No wheezing or rhonchi.  Abdominal:     Palpations: Abdomen is soft.     Tenderness: There is no abdominal  tenderness.  Musculoskeletal:     Right lower leg: No swelling.     Left lower leg: No swelling.  Skin:    General: Skin is warm.     Comments: Pressure dressing left leg.  Pictures reviewed in the computer in the chart review section.  Neurological:     Mental Status: She is alert.     Comments: Answers question appropriately.       Data Reviewed: Basic Metabolic Panel: Recent Labs  Lab 10/15/2020 1145 10/06/20 0357  NA 139 138  K 4.9 4.6  CL 103 103  CO2 25 27  GLUCOSE 125* 115*  BUN 43* 41*  CREATININE 2.67* 2.62*  CALCIUM 8.9 8.7*   CBC: Recent Labs  Lab 09/23/2020 1145 09/25/2020 1806 09/26/2020 2210 10/06/20 0823  WBC 6.9  --   --   --   NEUTROABS 5.8  --   --   --   HGB 7.4* 7.5* 8.2* 7.8*  HCT 25.0* 24.9* 26.3* 24.5*  MCV 106.8*  --   --   --   PLT 208  --   --   --    BNP (last 3 results) Recent Labs    09/06/20 0408 09/14/20 0847 10/16/2020 1145  BNP 1,287.6* 71.8 2,283.6*     Recent Results (from the past 240 hour(s))  SARS CORONAVIRUS 2 (TAT 6-24 HRS) Nasopharyngeal Nasopharyngeal Swab     Status: None  Collection Time: Oct 26, 2020  4:36 PM   Specimen: Nasopharyngeal Swab  Result Value Ref Range Status   SARS Coronavirus 2 NEGATIVE NEGATIVE Final    Comment: (NOTE) SARS-CoV-2 target nucleic acids are NOT DETECTED.  The SARS-CoV-2 RNA is generally detectable in upper and lower respiratory specimens during the acute phase of infection. Negative results do not preclude SARS-CoV-2 infection, do not rule out co-infections with other pathogens, and should not be used as the sole basis for treatment or other patient management decisions. Negative results must be combined with clinical observations, patient history, and epidemiological information. The expected result is Negative.  Fact Sheet for Patients: HairSlick.no  Fact Sheet for Healthcare Providers: quierodirigir.com  This test is not yet  approved or cleared by the [place] FDA and  has been authorized for detection and/or diagnosis of SARS-CoV-2 by FDA under an Emergency Use Authorization (EUA). This EUA will remain  in effect (meaning this test can be used) for the duration of the COVID-19 declaration under Se ction 564(b)(1) of the Act, 21 U.S.C. section 360bbb-3(b)(1), unless the authorization is terminated or revoked sooner.  Performed at [facility], [address], [place], [place] [address]      Studies: DG Tibia/Fibula Left  Result Date: 10/26/2020 CLINICAL DATA:  Wound infection. EXAM: LEFT TIBIA AND FIBULA - 2 VIEW COMPARISON:  06/11/2019 FINDINGS: The knee and ankle joints are maintained. There is a bandage over the midportion of the left leg. There appears to be gas in the soft tissues which may be related to an open wound. I do not see any plain film findings for osteomyelitis. IMPRESSION: 1. Gas in the soft tissues may be related to an open wound. 2. No plain film findings for osteomyelitis. Electronically Signed   By: Rudie Meyer M.D.   On: 26-Oct-2020 12:54   DG Chest Portable 1 View  Result Date: October 26, 2020 CLINICAL DATA:  Shortness of breath, low O2 sat. EXAM: PORTABLE CHEST 1 VIEW COMPARISON:  09/14/2020 and CT chest 03/14/2020. FINDINGS: Trachea is midline. Heart is enlarged. There is patchy bilateral airspace opacification with slight coarsening and bronchiectasis. Blunting of the left costophrenic angle. IMPRESSION: 1. Patchy bilateral airspace opacification is likely due to pneumonia. Difficult to exclude edema. 2. Findings appear to be superimposed on a component of chronic interstitial lung disease. 3. Blunting of the left costophrenic angle may be due to pleural fluid. Electronically Signed   By: Leanna Battles M.D.   On: 10/26/20 12:54    Scheduled Meds: . sodium chloride   Intravenous Once  . albuterol  2 puff Inhalation Q6H WA  . estradiol  1.5 mg Oral Daily  . flecainide  50 mg  Oral Q12H  . FLUoxetine  20 mg Oral Daily  . fluticasone  2 spray Each Nare BID  . fluticasone  1 puff Inhalation BID  . furosemide  40 mg Intravenous Daily  . loratadine  10 mg Oral Daily  . magic mouthwash  15 mL Oral TID  . montelukast  10 mg Oral QHS  . oxybutynin  5 mg Oral QPM  . pantoprazole  40 mg Oral QAC breakfast  . pramipexole  0.5 mg Oral QID  . sodium chloride flush  3 mL Intravenous Q12H  . sucralfate  1 g Oral TID  . verapamil  180 mg Oral Daily   Continuous Infusions: . sodium chloride    . phenylephrine (NEO-SYNEPHRINE) Adult infusion    . sodium chloride     Brief  history.  80 year old female with chronic respiratory failure, chronic atrial fibrillation, chronic diastolic CHF, hypertension, recent admission for COVID.  She had a fall at home and had a laceration of her left leg.  She was seen by home health nurse and sent into the hospital for low pulse ox.  Admitted for acute blood loss anemia acute CHF and acute kidney injury and left lower extremity wound.  Patient seen on 10/06/2020 and transferred to the ICU.  Assessment/Plan:  1. Hypotension.  Could be secondary to heart arrhythmia.  Could also be secondary to blood loss.  Stat hemoglobin 7.5 I will give a unit of blood.  We will give a fluid bolus.  Phenylephrine as needed to maintain blood pressure. 2. Possible ventricular tachycardia.  Case discussed with Dr. Okey Dupre cardiology to see the patient.  Patient on verapamil and flecainide for atrial fibrillation.  Holding Eliquis with blood loss. 3. Acute blood loss anemia from left leg.  Transfuse as needed.  Eliquis needs to get out of the system. 4. Left leg infection with elevated sedimentation rate.  When stable will obtain an MRI of the left lower extremity.  Infectious disease consultation.  Get more aggressive with antibiotics with vancomycin and aztreonam. 5. Acute kidney injury on chronic kidney disease stage IIIb.  May be worsened by Bactrim. 6. Right  shoulder pain.  X-rays ordered.  Had a fall and worsening shoulder pain.  Has a history of rotator cuff tear.  Orthopedic consultation. 7. Acute on chronic chronic diastolic congestive heart failure.  We will give Lasix after unit of blood. 8. Chronic atrial fibrillation holding anticoagulation.  On antiarrhythmics 9. Anxiety on fluoxetine 10. Acute hypoxic respiratory failure.  Recent COVID-19 infection.  Having increased oxygen requirements.  Get an ABG.  Can do high flow nasal cannula if needed.  Case discussed with critical care specialist.          Code Status:     Code Status Orders  (From admission, onward)         Start     Ordered   09/26/2020 1448  Full code  Continuous        09/24/2020 1452        Code Status History    Date Active Date Inactive Code Status Order ID Comments User Context   09/15/2020 2122 09/21/2020 1942 Full Code [id number]  Delsa Grana Inpatient   09/06/2020 0903 09/15/2020 1843 Full Code [id number]  Lucile Shutters, MD ED   03/12/2020 1625 03/19/2020 2049 Full Code [id number]  Rolly Salter, MD ED   06/11/2019 1608 06/20/2019 0045 Full Code [id number]  Pennie Banter, DO ED   02/03/2019 1742 02/06/2019 1719 Full Code [id number]  Adrian Saran, MD Inpatient   Advance Care Planning Activity    Advance Directive Documentation   Flowsheet Row Most Recent Value  Type of Advance Directive Healthcare Power of Attorney  Pre-existing out of facility DNR order (yellow form or pink MOST form) --  "MOST" Form in Place? --     Family Communication: POA at bedside Disposition Plan: Status is: Inpatient  Dispo: The patient is from: Home              Anticipated d/c is to: To be determined              Patient currently transferring to ICU   Difficult to place patient.  No.  Consultants:  Critical care specialist  Cardiology  Infectious disease  Orthopedic surgery  Antibiotics:  Vancomycin  Aztreonam  Time spent: 37 minutes, critical care  time.  Case discussed with specialists  Alford Highland  [facility]
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
## 2020-10-06 NOTE — Consult Note (Addendum)
NAME:  [name], MRN:  [id number], DOB:  1941-03-30, LOS: 1 ADMISSION DATE:  10/11/2020, CONSULTATION DATE:  10/06/2020 REFERRING MD: Dr. Renae Gloss, CHIEF COMPLAINT:  Hypotension  Brief Pt Description/Synopsis:  81 y.o. Female with recent COVID-19 infection admitted 09/25/2020 with Acute Hypoxic Respiratory Failure secondary to Acute HFpEF, along with Acute Symptomatic anemia from bleeding leg wound, and AKI.  On 10/06/20 developed shock (multifactorial in etiology) and episode of V-tach requiring transfer to ICU for possible Vasopressors.  History of Present Illness:  [name] is an 80 y.o. female with a past medical history significant for chronic respiratory failure on 1 to 2 L/min of oxygen at home,atrial fibrillation on chronic anticoagulation therapy with xarelto, history of HFpEF (LVEF of 55-60% in 02/2020), hypertension, recent admission for COVID 19 approximately 2 weeks ago.  She apparently fell at home about 2 weeks ago and sustained a laceration injury to her shin with significant bleeding reported. She has since followed up with wound care clinic and has home health nursing visiting her every day.She was seen by Home health nurses on 09/27/2020, and was reported to be short of breath from her baseline and was noted to be hypoxic on her pulse oximeter. PCP was contacted and patient advised to be brought to the ED for further management.  In the ED, patient was noted on workup to have AKI and acute anemia. She apparently has been on bactrim for treatment on lower extremity woundfor several days now. She also admitted to have bled significant when she sustained injury to LLE for which reason xarelto was held by PCP for a few days now. She denies any further active bleeding and denies any BRB per rectum.She however admits to some dark stool.No prior history of GI blood loss.She denies fever or chills  She was admitted to the Med-Surg unit by the Hospitalist for further workup and treatment of  Acute Hypoxic Respiratory Failure secondary to acute HFpEF, AKI, and Acute blood loss anemia.  On 10/06/20 she had an episode of Ventricular Tachycardia on telemetry, along with development of hypotension with SBP in the 70's, along with increasing oxygen requirements. She is being transferred to [facility] unit for possible initiation of vasopressors.  PCCM is asked to consult for further management of Shock, likely multifactorial etiology (Septic due to questionable infected LLE wound  vs. Hypovolemic due to acute blood loss anemia vs. Cardiogenic w/ V-tach).  Pertinent  Medical History  Recent COVID-19 infection approximately 2 weeks ago Paroxysmal atrial fibrillation on Xarelto HFpEF  Hypertension Palpitations GERD Bursitis Anxiety  Cultures:  09/28/2020: SARS-CoV-2 PCR>> negative 10/06/2020: Blood culture x2>>  Antimicrobials:  Cefepime 5/17>> Vancomycin 5/17>>  Significant Hospital Events: Including procedures, antibiotic start and stop dates in addition to other pertinent events   . 09/20/2020: Admitted to MedSurg unit by hospitalist . 10/06/2020: Developed hypotension, appears multifactorial in origin, transferred to ICU for possible vasopressors and PCCM consultation.  Placed on empiric aztreonam and vancomycin for possible sepsis.  ID, cardiology, and orthopedic surgery consulted  Interim History / Subjective:  -Patient with reported ventricular tachycardia seen on telemetry earlier this morning -Now hypotensive with blood pressures in the 70s with increasing FiO2 requirements -Transferred to ICU for possible initiation of vasopressors -ID and Orthopedic surgery consulted -Patient denies dizziness, palpitations, chest pain, shortness of breath, wheezing, abdominal pain, N/V/D, dysuria, fever, chills  Objective   Blood pressure (!) 91/57, pulse 68, temperature 98 F (36.7 C), temperature source Oral, resp. rate 18, height 5\' 6"  (  1.676 m), weight 79.9 kg, SpO2 (!) 88 %.         Intake/Output Summary (Last 24 hours) at 10/06/2020 1259 Last data filed at 10/06/2020 0950 Gross per 24 hour  Intake 1699.69 ml  Output 1000 ml  Net 699.69 ml   Filed Weights   09/29/2020 1150 10/06/20 0504  Weight: 79.4 kg 79.9 kg    Examination: General: Acute on chronically ill appearing female, sitting in bed, on 5L [place], in NAD HENT: Atraumatic, normocephalic, neck supple, no JVD Lungs: Coarse breath sounds bilaterally, no wheezing, even, nonlabored Cardiovascular: irregularly irregular rhythm, no M/R/G, 2+ distal pulses Abdomen: Obese, soft, nontender, nondistended, no gaurding or rebound tenderness, BS+ x4 Extremities: Generalized weakness, Wound to LLE (pt will not let me examine wound at this time, dressing is clean dry and intact).  No edema Neuro: Awake and alert, move all extremities to commands, no focal deficits (RUE is weak due to rotator cuff tear), pupils PERRL GU: Deffered  Labs/imaging that I havepersonally reviewed  (right click and "Reselect all SmartList Selections" daily)  Labs 10/06/2020: WBC 5.4, hemoglobin 7.9, hematocrit 25, bicarbonate 25, glucose 119, BUN 44, creatinine 2.86, albumin 2.5, high-sensitivity troponin 18, lactic acid 2.2 ABG: pH 7.49/pCO2 35/pO2 53/pCO2 26.7 CXR 10/06/20: Stable cardiomegaly. Patchy infiltrates are seen bilaterally, increased in mid lung zones compared to prior studies. Patient is rotated towards the LEFT.  Resolved Hospital Problem list     Assessment & Plan:   Shock, likely multifactorial etiology (Septic due to ? infected LLE wound  vs. Hypovolemic due to acute blood loss anemia vs. Cardiogenic w/ V-tach) Episode of V-tach Acute on Chronic HFpEF (Echo from 02/2020 w/ LVEF 60-65% with Grade II Diastolic Dysfunction) Mildly elevated troponin, suspect demand ischemia Paroxsymal Atrial Fibrillation on Xarelto -Continuous cardiac monitoring -Maintain MAP greater than 65 -Cautious IV fluids given CHF -Vasopressors as  needed to maintain MAP goal -Transfusions as indicated -Trend lactic acid -Trend HS troponin -Cardiology consulted, appreciate input -Diuresis as blood pressure and renal function permits~currently holding due to hypotension -Patient on verapamil and flecainide for atrial fibrillation -Holding Eliquis for now due to acute blood loss  Acute Blood loss Anemia, likely from bleeding wound -Monitor for S/Sx of bleeding -Trend CBC -SCD's for VTE Prophylaxis  -Transfuse for Hgb <7 -Holding Eliquis for now  Acute on Chronic Hypoxic Respiratory Failure secondary to Acute HFpEF PMHx of recent COVID-19 infection about 2 weeks ago, requiring 1-2 L supplemental O2 at home -Supplemental O2 as needed to maintain O2 sats greater than 90% -Follow intermittent chest x-ray and ABG as needed -As needed bronchodilators -Diuresis as blood pressure and renal function permits -Encourage pulmonary hygiene  Acute Kidney Injury -Monitor I&O's / urinary output -Follow BMP -Ensure adequate renal perfusion -Avoid nephrotoxic agents as able -Replace electrolytes as indicated  Meets SIRS Criteria Concern for developing Sepsis due to possible LLE Wound infection -Monitor fever curve -Trend WBCs and procalcitonin -Follow cultures as above -Placing on empiric cefepime and vancomycin for now pending cultures and sensitivity -ID consulted, appreciate input -Plan for MRI of the left lower extremity when feasible -Orthopedic surgery consulted, appreciate input    Pt is critically ill with multiorgan failure.  Prognosis is guarded.  High risk for cardiac arrest and death.  Recommend DNR/DNI status.  Best practice (right click and "Reselect all SmartList Selections" daily)  Diet:  Oral Pain/Anxiety/Delirium protocol (if indicated): No VAP protocol (if indicated): Not indicated DVT prophylaxis: Contraindicated GI prophylaxis: N/A Glucose control:  SSI No  Central venous access:  N/A Arterial line:   N/A Foley:  N/A Mobility:  OOB  PT consulted: Yes Last date of multidisciplinary goals of care discussion [N/A] Code Status:  full code Disposition: Stepdown  Updated pt's friend Lestine Box (pt's healthcare POA) at bedside 10/06/20.  All questions answered.  GOALS OF CARE DISCUSSION Myself and Dr. Jayme Cloud had long discussion with pt's POA Lestine Box and pt's son at bedside regarding her critical illness with shock and multi-ogran failure. Discussed poor prognosis, and high risk for further deterioration, cardiac arrest, and death. In the event she were to be intubated she would likely require long term mechanical ventilation, and possibly may never be able to wean from vent.     After discussion, family/POA request that pt be made DNR/DNI.  Will continue current measures (IV fluids, blood products, Neo-synephrine, ABX) but will not escalate care beyond current measures.  No dialysis.  Will have PICC line placed for access to continue current measures.  If in the event she continues to decline, will transition to comfort measures.  Palliative Care has been consulted.  Labs   CBC: Recent Labs  Lab 09/29/2020 1145 09/21/2020 1806 10/16/2020 2210 10/06/20 0823 10/06/20 1244  WBC 6.9  --   --   --   --   NEUTROABS 5.8  --   --   --   --   HGB 7.4* 7.5* 8.2* 7.8* 7.5*  HCT 25.0* 24.9* 26.3* 24.5*  --   MCV 106.8*  --   --   --   --   PLT 208  --   --   --   --     Basic Metabolic Panel: Recent Labs  Lab 10/13/2020 1145 10/06/20 0357  NA 139 138  K 4.9 4.6  CL 103 103  CO2 25 27  GLUCOSE 125* 115*  BUN 43* 41*  CREATININE 2.67* 2.62*  CALCIUM 8.9 8.7*   GFR: Estimated Creatinine Clearance: 18.9 mL/min (A) (by C-G formula based on SCr of 2.62 mg/dL (H)). Recent Labs  Lab 09/29/2020 1145 10/04/2020 1248  WBC 6.9  --   LATICACIDVEN  --  1.7    Liver Function Tests: No results for input(s): AST, ALT, ALKPHOS, BILITOT, PROT, ALBUMIN in the last 168 hours. No results for input(s):  LIPASE, AMYLASE in the last 168 hours. No results for input(s): AMMONIA in the last 168 hours.  ABG No results found for: PHART, PCO2ART, PO2ART, HCO3, TCO2, ACIDBASEDEF, O2SAT   Coagulation Profile: No results for input(s): INR, PROTIME in the last 168 hours.  Cardiac Enzymes: No results for input(s): CKTOTAL, CKMB, CKMBINDEX, TROPONINI in the last 168 hours.  HbA1C: Hgb A1c MFr Bld  Date/Time Value Ref Range Status  09/20/2020 04:36 PM 5.5 4.8 - 5.6 % Final    Comment:    (NOTE) Pre diabetes:          5.7%-6.4%  Diabetes:              >6.4%  Glycemic control for   <7.0% adults with diabetes   08/20/2010 09:18 AM (H) <5.7 % Final   5.7 (NOTE)  According to the ADA Clinical Practice Recommendations for 2011, when HbA1c is used as a screening test:   >=6.5%   Diagnostic of Diabetes Mellitus           (if abnormal result  is confirmed)  5.7-6.4%   Increased risk of developing Diabetes Mellitus  References:Diagnosis and Classification of Diabetes Mellitus,Diabetes Care,2011,34(Suppl 1):S62-S69 and Standards of Medical Care in         Diabetes - 2011,Diabetes Care,2011,34  (Suppl 1):S11-S61.    CBG: No results for input(s): GLUCAP in the last 168 hours.  Review of Systems:   Positives in BOLD: Pt denies all complaints Gen: Denies fever, chills, weight change, fatigue, night sweats HEENT: Denies blurred vision, double vision, hearing loss, tinnitus, sinus congestion, rhinorrhea, sore throat, neck stiffness, dysphagia PULM: Denies shortness of breath, cough, sputum production, hemoptysis, wheezing CV: Denies chest pain, edema, orthopnea, paroxysmal nocturnal dyspnea, palpitations GI: Denies abdominal pain, nausea, vomiting, diarrhea, hematochezia, melena, constipation, change in bowel habits GU: Denies dysuria, hematuria, polyuria, oliguria, urethral discharge Endocrine: Denies hot or cold intolerance, polyuria,  polyphagia or appetite change Derm: Denies rash, dry skin, scaling or peeling skin change Heme: Denies easy bruising, bleeding, bleeding gums Neuro: Denies headache, numbness, weakness, slurred speech, loss of memory or consciousness   Past Medical History:  She,  has a past medical history of A-fib (HCC) (2012, 2014), Anxiety, Arrhythmia, Bursitis, Chronic diastolic (congestive) heart failure (HCC), Concussion, Concussion (July, 28, 2014), GERD (gastroesophageal reflux disease), Hypertension, Palpitations, and Pneumonia (2012).   Surgical History:   Past Surgical History:  Procedure Laterality Date  . ABDOMINAL SURGERY  2008   abdominal muscle mesh insert  . Arm surgery  2015   Pin implanted   . CATARACT EXTRACTION Right August 30, 2013  . CATARACT EXTRACTION Left August 09, 2013  . COLONOSCOPY    . ESOPHAGOGASTRODUODENOSCOPY    . HERNIA REPAIR Left 1970  . HERNIA REPAIR  2015   Dr. Malissa Hippo  . KNEE SURGERY Right 1980  . NOSE SURGERY    . SHOULDER SURGERY Right 2000  . TOTAL ABDOMINAL HYSTERECTOMY  1995     Social History:   reports that she has never smoked. She has quit using smokeless tobacco. She reports that she does not drink alcohol and does not use drugs.   Family History:  Her family history is negative for Dementia. She was adopted.   Allergies Allergies  Allergen Reactions  . Penicillins Hives    Did it involve swelling of the face/tongue/throat, SOB, or low BP? NO Did it involve sudden or severe rash/hives, skin peeling, or any reaction on the inside of your mouth or nose? Yes. Pt reports severe hives (blistering) from neck to her stomach Did you need to seek medical attention at a hospital or doctor's office? Unknown When did it last happen?>36 years ago If all above answers are "NO", may proceed with cephalosporin use.  . Amlodipine Rash  . Clarithromycin Rash  . Minocycline Other (See Comments)    Onset 04/10/2001.  / unknown reaction  . Propoxyphene  Hives    Onset 04/10/2001.   . Ace Inhibitors Cough  . Norvasc [Amlodipine Besylate] Hives  . Vicodin [Hydrocodone-Acetaminophen] Hives    Hives occur after 5 doses   . Betadine [Povidone Iodine] Rash and Other (See Comments)    blisters  . Iodine Rash  . Sulfa Antibiotics Diarrhea and Nausea Only     Home Medications  Prior to Admission medications   Medication Sig  Start Date End Date Taking? Authorizing Provider  acetaminophen (TYLENOL) 500 MG tablet Take 2 tablets (1,000 mg total) by mouth 3 (three) times daily as needed for mild pain, moderate pain or headache. 09/15/20  Yes Esaw Grandchild A, DO  albuterol (VENTOLIN HFA) 108 (90 Base) MCG/ACT inhaler Inhale 2 puffs into the lungs every 6 (six) hours. 09/15/20  Yes Pennie Banter, DO  ALPRAZolam Prudy Feeler) 0.25 MG tablet Take 0.25 mg by mouth 3 (three) times daily as needed for anxiety.    Yes [provider]  Ascorbic Acid (VITAMIN C) 1000 MG tablet Take 1,000 mg by mouth daily.   Yes [provider]  benzocaine (ORAJEL) 10 % mucosal gel Use as directed in the mouth or throat 4 (four) times daily as needed for mouth pain. 09/15/20  Yes Pennie Banter, DO  butalbital-acetaminophen-caffeine (FIORICET) 50-325-40 MG tablet Take 1 tablet by mouth every 4 (four) hours as needed for headache.   Yes [provider]  carbamazepine (TEGRETOL XR) 100 MG 12 hr tablet Take 100 mg by mouth 2 (two) times daily as needed (RLS symptoms).   Yes [provider]  celecoxib (CELEBREX) 100 MG capsule Take 100 mg by mouth 2 (two) times daily.   Yes [provider]  cetirizine (ZYRTEC) 10 MG tablet Take 10 mg by mouth daily.   Yes [provider]  colchicine 0.6 MG tablet Take 1 tablet (0.6 mg total) by mouth daily as needed (gout flare). 06/19/19  Yes Enedina Finner, MD  esomeprazole (NEXIUM) 20 MG capsule Take 20 mg by mouth daily before breakfast.    Yes [provider]  estradiol (ESTRACE) 1 MG  tablet Take 1.5 mg by mouth daily.    Yes [provider]  famotidine (PEPCID) 20 MG tablet Take 20 mg by mouth daily.   Yes [provider]  flecainide (TAMBOCOR) 50 MG tablet Take 1 tablet (50 mg total) by mouth every 12 (twelve) hours. 09/15/20  Yes Esaw Grandchild A, DO  FLUoxetine (PROZAC) 20 MG capsule Take 20 mg by mouth daily.   Yes [provider]  fluticasone (FLONASE) 50 MCG/ACT nasal spray Place 2 sprays into both nostrils 2 (two) times daily.   Yes [provider]  Fluticasone Furoate 100 MCG/ACT AEPB Take 1 puff by mouth daily. 09/23/20  Yes [provider]  furosemide (LASIX) 20 MG tablet Take 1 tablet (20 mg total) by mouth 2 (two) times daily. 09/15/20  Yes Esaw Grandchild A, DO  guaiFENesin-dextromethorphan (ROBITUSSIN DM) 100-10 MG/5ML syrup Take 10 mLs by mouth every 4 (four) hours as needed for cough. 09/15/20  Yes Pennie Banter, DO  hydrocortisone (ANUSOL-HC) 2.5 % rectal cream Place rectally 2 (two) times daily. 09/15/20  Yes Esaw Grandchild A, DO  magnesium gluconate (MAGONATE) 500 MG tablet Take 1 tablet (500 mg total) by mouth 2 (two) times daily. 09/15/20  Yes Esaw Grandchild A, DO  metoprolol tartrate (LOPRESSOR) 25 MG tablet Take 0.5 tablets (12.5 mg total) by mouth daily as needed (fast HR (over 110 beat/min)). 09/15/20  Yes Esaw Grandchild A, DO  montelukast (SINGULAIR) 10 MG tablet Take 1 tablet (10 mg total) by mouth at bedtime. 09/15/20  Yes Pennie Banter, DO  Multiple Vitamin (MULTIVITAMIN WITH MINERALS) TABS tablet Take 1 tablet by mouth daily with supper.   Yes [provider]  multivitamin-lutein (OCUVITE-LUTEIN) CAPS capsule Take 1 capsule by mouth daily. 09/16/20  Yes Esaw Grandchild A, DO  ondansetron (ZOFRAN) 4 MG tablet  Take 2-4 mg by mouth every 6 (six) hours as needed for nausea or vomiting.   Yes [provider]  oxybutynin (DITROPAN) 5 MG tablet Take 5 mg by mouth every evening.  10/01/14  Yes  [provider]  pramipexole (MIRAPEX) 0.5 MG tablet Take 1 tablet (0.5 mg total) by mouth 4 (four) times daily. 09/15/20  Yes Esaw Grandchild A, DO  sodium chloride (OCEAN) 0.65 % SOLN nasal spray Place 1 spray into both nostrils as needed for congestion. 09/15/20  Yes Esaw Grandchild A, DO  sucralfate (CARAFATE) 1 g tablet Take 1 g by mouth 3 (three) times daily. 05/07/19  Yes [provider]  sulfamethoxazole-trimethoprim (BACTRIM) 400-80 MG tablet Take 1 tablet by mouth 2 (two) times daily. 09/28/20  Yes Triplett, Cari B, FNP  SUMAtriptan (IMITREX) 50 MG tablet Take 50 mg by mouth daily as needed for migraine.   Yes [provider]  traZODone (DESYREL) 50 MG tablet Take 50 mg by mouth at bedtime.   Yes [provider]  verapamil (VERELAN PM) 180 MG 24 hr capsule Take 180 mg by mouth in the morning and at bedtime.    Yes [provider]  vitamin B-12 (CYANOCOBALAMIN) 1000 MCG tablet Take 1,000 mcg by mouth at bedtime.    Yes [provider]  acyclovir ointment (ZOVIRAX) 5 % Apply topically every 3 (three) hours. Patient not taking: No sig reported 09/15/20   Esaw Grandchild A, DO  ELIQUIS 5 MG TABS tablet TAKE 1 TABLET(5 MG) BY MOUTH TWICE DAILY Patient not taking: No sig reported 07/29/20   Antonieta Iba, MD  fluticasone (FLOVENT HFA) 220 MCG/ACT inhaler Inhale 1 puff into the lungs 2 (two) times daily. Patient not taking: No sig reported 09/15/20   Esaw Grandchild A, DO  magic mouthwash SOLN Take 15 mLs by mouth 3 (three) times daily. Patient not taking: No sig reported 09/15/20   Pennie Banter, DO  predniSONE (DELTASONE) 50 MG tablet Take 1 tablet (50 mg) by mouth once daily with breakfast Patient not taking: No sig reported 09/16/20   Pennie Banter, DO  tiZANidine (ZANAFLEX) 2 MG tablet Take 1 tablet (2 mg total) by mouth 3 (three) times daily as needed for muscle spasms. Patient not taking: No sig reported 09/15/20   Esaw Grandchild  A, DO  traZODone (DESYREL) 100 MG tablet Take 0.5 tablets (50 mg total) by mouth at bedtime as needed for sleep. Patient not taking: No sig reported 03/19/20   Lurene Shadow, MD     Critical care time: 50 minutes    Harlon Ditty, AGACNP-BC [facility] Prefer epic messenger for cross cover needs If after hours, please call E-link

## 2020-10-06 NOTE — Consult Note (Signed)
Infectious Disease     Reason for Consult: Wound infection    Referring Physician: Dr Earleen Newport Date of Admission:  10/07/2020   Active Problems:   Acute kidney injury superimposed on CKD ([place])   CHF exacerbation ([facility])   Hypotension   Ventricular tachycardia ([place])   Acute blood loss anemia   Open wound of left lower leg   HPI: [name] is a 80 y.o. female with chronic respiratory failure on 1 to 2 L/min of oxygen at home,atrial fibrillation on chronic anticoagulation therapy with xarelto, history of CHF(LVEF of 55-60% in 02/2020), hypertension, recent admission for COVID 19 barely 2 weeks ago.  She apparently fell at home about 2 weeks ago and sustained a laceration injury to her shin with significant bleeding reported. She has since followed up with wound care clinic and has home health nursing visiting her every day.She was seen by Home health nurses today and was reported to be short of breath from her baseline and was noted to be hypoxic on her pulse oximeter. PCP was contacted and patient advised to be brought to the ED for further management. Patient was noted on workup to have AKI and acute anemia. She apparently has been on bactrim for treatment on lower extremity woundfor several days now.  On admit no fevers, wbc 6.9. cr 2.6, BNP 22000, crp 22.2, esr 124. Worsened on floor with arrhythmia and hypotension and now in unit  Past Medical History:  Diagnosis Date  . A-fib ([place]) 2012, 2014  . Anxiety   . Arrhythmia    A-fib  . Bursitis    hips  . Chronic diastolic (congestive) heart failure ([place])   . Concussion   . Concussion July, 28, 2014  . GERD (gastroesophageal reflux disease)   . Hypertension   . Palpitations   . Pneumonia 2012   Past Surgical History:  Procedure Laterality Date  . ABDOMINAL SURGERY  2008   abdominal muscle mesh insert  . Arm surgery  2015   Pin implanted   . CATARACT EXTRACTION Right August 30, 2013  . CATARACT EXTRACTION Left August 09, 2013  .  COLONOSCOPY    . ESOPHAGOGASTRODUODENOSCOPY    . HERNIA REPAIR Left 1970  . HERNIA REPAIR  2015   Dr. Nicholes Stairs  . KNEE SURGERY Right 1980  . NOSE SURGERY    . SHOULDER SURGERY Right 2000  . TOTAL ABDOMINAL HYSTERECTOMY  1995   Social History   Tobacco Use  . Smoking status: Never Smoker  . Smokeless tobacco: Former Network engineer  . Vaping Use: Never used  Substance Use Topics  . Alcohol use: No  . Drug use: No   Family History  Adopted: Yes  Problem Relation Age of Onset  . Dementia Neg Hx        ADOPTED    Allergies:  Allergies  Allergen Reactions  . Penicillins Hives    Did it involve swelling of the face/tongue/throat, SOB, or low BP? NO Did it involve sudden or severe rash/hives, skin peeling, or any reaction on the inside of your mouth or nose? Yes. Pt reports severe hives (blistering) from neck to her stomach Did you need to seek medical attention at a hospital or doctor's office? Unknown When did it last happen?>36 years ago If all above answers are "NO", may proceed with cephalosporin use.  . Amlodipine Rash  . Clarithromycin Rash  . Minocycline Other (See Comments)    Onset 04/10/2001.  / unknown reaction  .  Propoxyphene Hives    Onset 04/10/2001.   . Ace Inhibitors Cough  . Norvasc [Amlodipine Besylate] Hives  . Vicodin [Hydrocodone-Acetaminophen] Hives    Hives occur after 5 doses   . Betadine [Povidone Iodine] Rash and Other (See Comments)    blisters  . Iodine Rash  . Sulfa Antibiotics Diarrhea and Nausea Only    Current antibiotics: Antibiotics Given (last 72 hours)    Date/Time Action Medication Dose Rate   10/06/2020 1455 New Bag/Given   vancomycin (VANCOCIN) IVPB 1000 mg/200 mL premix 1,000 mg 200 mL/hr   10/10/2020 2053 Given   clindamycin (CLEOCIN) capsule 300 mg 300 mg    10/06/20 0622 Given   clindamycin (CLEOCIN) capsule 300 mg 300 mg    10/06/20 1119 Given   clindamycin (CLEOCIN) capsule 300 mg 300 mg       MEDICATIONS: .  sodium chloride   Intravenous Once  . sodium chloride   Intravenous Once  . albuterol  2 puff Inhalation Q6H WA  . Chlorhexidine Gluconate Cloth  6 each Topical Daily  . estradiol  1.5 mg Oral Daily  . flecainide  50 mg Oral Q12H  . FLUoxetine  20 mg Oral Daily  . fluticasone  2 spray Each Nare BID  . fluticasone  1 puff Inhalation BID  . furosemide  40 mg Intravenous Daily  . loratadine  10 mg Oral Daily  . magic mouthwash  15 mL Oral TID  . montelukast  10 mg Oral QHS  . oxybutynin  5 mg Oral QPM  . pantoprazole  40 mg Oral QAC breakfast  . pramipexole  0.5 mg Oral QID  . sodium chloride flush  3 mL Intravenous Q12H  . sucralfate  1 g Oral TID  . verapamil  180 mg Oral Daily    Review of Systems - unable to obtain OBJECTIVE: Temp:  [97.7 F (36.5 C)-98.1 F (36.7 C)] 97.7 F (36.5 C) (05/17 1305) Pulse Rate:  [57-99] 99 (05/17 1305) Resp:  [15-28] 19 (05/17 1305) BP: (81-128)/(44-72) 91/66 (05/17 1302) SpO2:  [69 %-95 %] 69 % (05/17 1305) Weight:  [79.9 kg-84.2 kg] 84.2 kg (05/17 1305) Physical Exam  Constitutional:  Fully awake, interactive. HENT: [place]/AT, PERRLA, no scleral icterus Mouth/Throat: Oropharynx is clear and moist. No oropharyngeal exudate.  Cardiovascular: Normal rate, regular rhythm and normal heart sounds. Pulmonary/Chest: Effort normal and breath sounds normal. No respiratory distress.  has no wheezes.  Neck = supple, no nuchal rigidity Abdominal: Soft. Bowel sounds are normal.  exhibits no distension. There is no tenderness.  Lymphadenopathy: no cervical adenopathy. No axillary adenopathy Neurological: alert and interactive Skin: L leg see photos      Psychiatric: a normal mood and affect.  behavior is normal.    LABS: Results for orders placed or performed during the hospital encounter of 10/04/2020 (from the past 48 hour(s))  Basic metabolic panel     Status: Abnormal   Collection Time: 10/20/2020 11:45 AM  Result Value Ref Range   Sodium 139 135  - 145 mmol/L   Potassium 4.9 3.5 - 5.1 mmol/L   Chloride 103 98 - 111 mmol/L   CO2 25 22 - 32 mmol/L   Glucose, Bld 125 (H) 70 - 99 mg/dL    Comment: Glucose reference range applies only to samples taken after fasting for at least 8 hours.   BUN 43 (H) 8 - 23 mg/dL   Creatinine, Ser 2.67 (H) 0.44 - 1.00 mg/dL   Calcium 8.9 8.9 - 10.3  mg/dL   GFR, Estimated 18 (L) >60 mL/min    Comment: (NOTE) Calculated using the CKD-EPI Creatinine Equation (2021)    Anion gap 11 5 - 15    Comment: Performed at [facility], [address] [place], [place] [address]  CBC with Differential     Status: Abnormal   Collection Time: 10/01/2020 11:45 AM  Result Value Ref Range   WBC 6.9 4.0 - 10.5 K/uL   RBC 2.34 (L) 3.87 - 5.11 MIL/uL   Hemoglobin 7.4 (L) 12.0 - 15.0 g/dL   HCT 25.0 (L) 36.0 - 46.0 %   MCV 106.8 (H) 80.0 - 100.0 fL   MCH 31.6 26.0 - 34.0 pg   MCHC 29.6 (L) 30.0 - 36.0 g/dL   RDW 18.0 (H) 11.5 - 15.5 %   Platelets 208 150 - 400 K/uL   nRBC 1.3 (H) 0.0 - 0.2 %   Neutrophils Relative % 84 %   Neutro Abs 5.8 1.7 - 7.7 K/uL   Lymphocytes Relative 7 %   Lymphs Abs 0.5 (L) 0.7 - 4.0 K/uL   Monocytes Relative 7 %   Monocytes Absolute 0.5 0.1 - 1.0 K/uL   Eosinophils Relative 1 %   Eosinophils Absolute 0.1 0.0 - 0.5 K/uL   Basophils Relative 0 %   Basophils Absolute 0.0 0.0 - 0.1 K/uL   Immature Granulocytes 1 %   Abs Immature Granulocytes 0.09 (H) 0.00 - 0.07 K/uL    Comment: Performed at [facility], [address] [place], [place] [address]  Troponin I (High Sensitivity)     Status: Abnormal   Collection Time: 09/27/2020 11:45 AM  Result Value Ref Range   Troponin I (High Sensitivity) 29 (H) <18 ng/L    Comment: (NOTE) Elevated high sensitivity troponin I (hsTnI) values and significant  changes across serial measurements may suggest ACS but many other  chronic and acute conditions are known to elevate hsTnI results.  Refer to the "Links" section for chest pain  algorithms and additional  guidance. Performed at [facility], [address] [place], [place] [address]   Brain natriuretic peptide     Status: Abnormal   Collection Time: 09/22/2020 11:45 AM  Result Value Ref Range   B Natriuretic Peptide 2,283.6 (H) 0.0 - 100.0 pg/mL    Comment: Performed at [facility], [address] [place], [place] [address]  Lactic acid, plasma     Status: None   Collection Time: 10/19/2020 12:48 PM  Result Value Ref Range   Lactic Acid, Venous 1.7 0.5 - 1.9 mmol/L    Comment: Performed at [facility], [address] [place], [place] [address]  Prepare RBC (crossmatch)     Status: None   Collection Time: 10/17/2020  2:54 PM  Result Value Ref Range   Order Confirmation      ORDER PROCESSED BY BLOOD BANK Performed at [facility], [place], [place] [address]   Troponin I (High Sensitivity)     Status: Abnormal   Collection Time: 10/16/2020  4:36 PM  Result Value Ref Range   Troponin I (High Sensitivity) 27 (H) <18 ng/L    Comment: (NOTE) Elevated high sensitivity troponin I (hsTnI) values and significant  changes across serial measurements may suggest ACS but many other  chronic and acute conditions are known to elevate hsTnI results.  Refer to the "Links" section for chest pain algorithms and additional  guidance. Performed at [facility], [address] [place], [place] [address]   Hemoglobin A1c     Status: None   Collection Time: 09/24/2020  4:36 PM  Result Value Ref Range   Hgb A1c MFr Bld 5.5 4.8 - 5.6 %    Comment: (NOTE) Pre diabetes:          5.7%-6.4%  Diabetes:              >6.4%  Glycemic control for   <7.0% adults with diabetes    Mean Plasma Glucose 111.15 mg/dL    Comment: Performed at [facility] [address] [place], [place] [address]  Sedimentation rate     Status: Abnormal   Collection Time: 10/02/2020  4:36 PM  Result Value Ref Range   Sed Rate 124 (H)  0 - 22 mm/hr    Comment: Performed at [facility], [place], [place] [address]  C-reactive protein     Status: Abnormal   Collection Time: 09/22/2020  4:36 PM  Result Value Ref Range   CRP 22.2 (H) <1.0 mg/dL    Comment: Performed at [facility], [place] [address] [place], [place] [address]  Prealbumin     Status: Abnormal   Collection Time:   4:36 PM  Result Value Ref Range   Prealbumin 14.9 (L) 18 - 38 mg/dL    Comment: Performed at [facility] [address], [place], [place] [address]  Type and screen [place]     Status: None (Preliminary result)   Collection Time: 10/18/2020  4:36 PM  Result Value Ref Range   ABO/RH(D) A POS    Antibody Screen NEG    Sample Expiration 2020-10-30,2359    Unit Number [id number]    Blood Component Type RBC LR PHER2    Unit division 00    Status of Unit ISSUED,FINAL    Transfusion Status OK TO TRANSFUSE    Crossmatch Result Compatible    Unit Number [id number]    Blood Component Type RBC LR PHER1    Unit division 00    Status of Unit ALLOCATED    Transfusion Status OK TO TRANSFUSE    Crossmatch Result      Compatible Performed at [facility], [place], [place] [address]   SARS CORONAVIRUS 2 (TAT 6-24 HRS) Nasopharyngeal Nasopharyngeal Swab     Status: None   Collection Time: 09/26/2020  4:36 PM   Specimen: Nasopharyngeal Swab  Result Value Ref Range   SARS Coronavirus 2 NEGATIVE NEGATIVE    Comment: (NOTE) SARS-CoV-2 target nucleic acids are NOT DETECTED.  The SARS-CoV-2 RNA is generally detectable in upper and lower respiratory specimens during the acute phase of infection. Negative results do not preclude SARS-CoV-2 infection, do not rule out co-infections with other pathogens, and should not be used as the sole basis for treatment or other patient management decisions. Negative results must be combined with clinical  observations, patient history, and epidemiological information. The expected result is Negative.  Fact Sheet for Patients: SugarRoll.be  Fact Sheet for Healthcare Providers: https://www.woods-mathews.com/  This test is not yet approved or cleared by the [place] FDA and  has been authorized for detection and/or diagnosis of SARS-CoV-2 by FDA under an Emergency Use Authorization (EUA). This EUA will remain  in effect (meaning this test can be used) for the duration of the COVID-19 declaration under Se ction 564(b)(1) of the Act, 21 U.S.C. section 360bbb-3(b)(1), unless the authorization is  terminated or revoked sooner.  Performed at [facility], [place] [address], [place], [place] [address]   Hemoglobin and hematocrit, blood     Status: Abnormal   Collection Time: 09/30/2020  6:06 PM  Result Value Ref Range   Hemoglobin 7.5 (L) 12.0 - 15.0 g/dL   HCT 24.9 (L) 36.0 - 46.0 %    Comment: Performed at [facility], [address] [place], [place] [address]  Hemoglobin and hematocrit, blood     Status: Abnormal   Collection Time: 10/02/2020 10:10 PM  Result Value Ref Range   Hemoglobin 8.2 (L) 12.0 - 15.0 g/dL   HCT 26.3 (L) 36.0 - 46.0 %    Comment: Performed at [facility], [address] [place], [place] [address]  Basic metabolic panel     Status: Abnormal   Collection Time: 10/06/20  3:57 AM  Result Value Ref Range   Sodium 138 135 - 145 mmol/L   Potassium 4.6 3.5 - 5.1 mmol/L   Chloride 103 98 - 111 mmol/L   CO2 27 22 - 32 mmol/L   Glucose, Bld 115 (H) 70 - 99 mg/dL    Comment: Glucose reference range applies only to samples taken after fasting for at least 8 hours.   BUN 41 (H) 8 - 23 mg/dL   Creatinine, Ser 2.62 (H) 0.44 - 1.00 mg/dL   Calcium 8.7 (L) 8.9 - 10.3 mg/dL   GFR, Estimated 18 (L) >60 mL/min    Comment: (NOTE) Calculated using the CKD-EPI Creatinine Equation (2021)    Anion gap 8 5 - 15     Comment: Performed at [facility], [address] [place], [place] [address]  Urinalysis, Complete w Microscopic Urine, Clean Catch     Status: Abnormal   Collection Time: 10/06/20  7:24 AM  Result Value Ref Range   Color, Urine YELLOW (A) YELLOW   APPearance CLEAR (A) CLEAR   Specific Gravity, Urine 1.010 1.005 - 1.030   pH 8.0 5.0 - 8.0   Glucose, UA NEGATIVE NEGATIVE mg/dL   Hgb urine dipstick NEGATIVE NEGATIVE   Bilirubin Urine NEGATIVE NEGATIVE   Ketones, ur NEGATIVE NEGATIVE mg/dL   Protein, ur NEGATIVE NEGATIVE mg/dL   Nitrite NEGATIVE NEGATIVE   Leukocytes,Ua NEGATIVE NEGATIVE   RBC / HPF 0-5 0 - 5 RBC/hpf   WBC, UA 0-5 0 - 5 WBC/hpf   Bacteria, UA RARE (A) NONE SEEN   Squamous Epithelial / LPF 0-5 0 - 5   Mucus PRESENT     Comment: Performed at [facility], [address] [place], [place] [address]  Hemoglobin and hematocrit, blood     Status: Abnormal   Collection Time: 10/06/20  8:23 AM  Result Value Ref Range   Hemoglobin 7.8 (L) 12.0 - 15.0 g/dL   HCT 24.5 (L) 36.0 - 46.0 %    Comment: Performed at [facility], [address] [place], [place] [address]  Hemoglobin     Status: Abnormal   Collection Time: 10/06/20 12:44 PM  Result Value Ref Range   Hemoglobin 7.5 (L) 12.0 - 15.0 g/dL    Comment: Performed at [facility], [address] [place],  [address]  Prepare RBC (crossmatch)     Status: None   Collection Time: 10/06/20 12:59 PM  Result Value Ref Range   Order Confirmation      ORDER PROCESSED BY BLOOD BANK Performed at [facility], [address] [place], [place]  [address]   Glucose, capillary     Status: Abnormal   Collection Time: 10/06/20  1:00 PM  Result Value Ref Range   Glucose-Capillary 129 (H) 70 - 99 mg/dL    Comment: Glucose reference range applies only to samples taken after fasting for at least 8 hours.   No components found for: ESR, C REACTIVE PROTEIN MICRO: Recent  Results (from the past 720 hour(s))  SARS CORONAVIRUS 2 (TAT 6-24 HRS) Nasopharyngeal Nasopharyngeal Swab     Status: None   Collection Time: 09/24/2020  4:36 PM   Specimen: Nasopharyngeal Swab  Result Value Ref Range Status   SARS Coronavirus 2 NEGATIVE NEGATIVE Final    Comment: (NOTE) SARS-CoV-2 target nucleic acids are NOT DETECTED.  The SARS-CoV-2 RNA is generally detectable in upper and lower respiratory specimens during the acute phase of infection. Negative results do not preclude SARS-CoV-2 infection, do not rule out co-infections with other pathogens, and should not be used as the sole basis for treatment or other patient management decisions. Negative results must be combined with clinical observations, patient history, and epidemiological information. The expected result is Negative.  Fact Sheet for Patients: SugarRoll.be  Fact Sheet for Healthcare Providers: https://www.woods-mathews.com/  This test is not yet approved or cleared by the [place] FDA and  has been authorized for detection and/or diagnosis of SARS-CoV-2 by FDA under an Emergency Use Authorization (EUA). This EUA will remain  in effect (meaning this test can be used) for the duration of the COVID-19 declaration under Se ction 564(b)(1) of the Act, 21 U.S.C. section 360bbb-3(b)(1), unless the authorization is terminated or revoked sooner.  Performed at [facility], [place] [address], [place], [place] [address]     IMAGING: DG Tibia/Fibula Left  Result Date: 09/23/2020 CLINICAL DATA:  Wound infection. EXAM: LEFT TIBIA AND FIBULA - 2 VIEW COMPARISON:  06/11/2019 FINDINGS: The knee and ankle joints are maintained. There is a bandage over the midportion of the left leg. There appears to be gas in the soft tissues which may be related to an open wound. I do not see any plain film findings for osteomyelitis. IMPRESSION: 1. Gas in the soft tissues may be related  to an open wound. 2. No plain film findings for osteomyelitis. Electronically Signed   By: Marijo Sanes M.D.   On: 10/17/2020 12:54   DG Chest Portable 1 View  Result Date: 10/20/2020 CLINICAL DATA:  Shortness of breath, low O2 sat. EXAM: PORTABLE CHEST 1 VIEW COMPARISON:  09/14/2020 and CT chest 03/14/2020. FINDINGS: Trachea is midline. Heart is enlarged. There is patchy bilateral airspace opacification with slight coarsening and bronchiectasis. Blunting of the left costophrenic angle. IMPRESSION: 1. Patchy bilateral airspace opacification is likely due to pneumonia. Difficult to exclude edema. 2. Findings appear to be superimposed on a component of chronic interstitial lung disease. 3. Blunting of the left costophrenic angle may be due to pleural fluid. Electronically Signed   By: Lorin Picket M.D.   On: 09/20/2020 12:54   DG Chest Port 1 View  Result Date: 09/14/2020 CLINICAL DATA:  COVID-19.  Shortness of breath. EXAM: PORTABLE CHEST 1 VIEW COMPARISON:  September 06, 2020. FINDINGS: Stable cardiomegaly. No pneumothorax is noted. Decreased bibasilar opacities are noted suggesting improving atelectasis or edema. Stable large hiatal hernia. Bony thorax is unremarkable. IMPRESSION: Decreased bibasilar opacities are noted as described above. Electronically Signed   By: Marijo Conception M.D.   On: 09/14/2020 08:57    Assessment:  Ann Hipp  [name] is a 80 y.o. female with  chronic respiratory failure on 1 to 2 L/min of oxygen at home,atrial fibrillation on chronic anticoagulation therapy with xarelto, history of CHF(LVEF of 55-60% in 02/2020), hypertension, recent admission for COVID 19 barely 2 weeks ago.  She apparently fell at home about 2 weeks ago and sustained a laceration injury to her shin with significant bleeding reported.  Now admitted with SOB and found to have AKI, worsening resp failure hypotension, anemia and CHF exac with elevated BNP.  She has two open wounds on her LE from her recent fall.  There is some drainage and they are quite tender. Could be a source of sepsis.  Recommendations Culture done of wound to eval for MDROs Cont vanco - can change to dapto for renal if needed Agree with cefepime. Monitor closely. May need MRI leg at some point once stablized to eval for underlying osteo.  Thank you very much for allowing me to participate in the care of this patient. Please call with questions.   Cheral Marker. Ola Spurr, MD

## 2020-10-06 NOTE — Evaluation (Signed)
Physical Therapy Evaluation Patient Details Name: [name] MRN: [id number] DOB: [birth year] Today's Date: 10/06/2020   History of Present Illness  Pt is a 80 y.o. female presenting to hospital 5/16 with SOB/hypoxia and wound to L LE.  Per chart pt had injury to L leg d/t fall last week.  Pt admitted with acute symptomatic anemia, acute CHF, AKI, L LE wound, recent COVID-19, h/o frequent falls at home.  Of note, pt with MRI R shoulder in April showing: 1. Complete full-thickness retracted tears of the supraspinatus and  subscapularis tendons with associated muscle atrophy.  2. Tendinosis and high-grade partial-thickness tearing of the  infraspinatus tendon.  3. Dislocation of the long head biceps tendon into the anterior  aspect of the glenohumeral joint but remains intact.  Pt reports falling since this imaging was taken (hitting shoulder on chair) and having more pain now.  PMH includes a-fib, CHF, 1 L home O2 at night, COVID (+) 09/06/20, RLS, anxiety, htn, PNA, anemia of chronic disease, hyperkalemia, abdominal sx, arm sx, R knee sx, nose sx, R shoulder sx.  Clinical Impression  Prior to hospital admission, pt was ambulating shorter distances with RW and assistance; lives alone but has had 24/7 assist from paid caregivers since recent hospital discharge.  Pt reports receiving MRI for R shoulder recently (April) but has fallen since and now reporting more pain in R shoulder since recent fall limiting R UE use (MD notified via secure message).  Pt pleasant and motivated to participate in therapy session.  Currently pt is 1-2 assist with bed mobility.  Upon sitting up on edge of bed pt's O2 (on 6 L via nasal cannula) decreased from 91% at rest to 77-83% and SOB noted; with cueing for pursed lip breathing and extra time pt's O2 sats improved to 91-92% sitting edge of bed.  Pt then assisted back to bed and pt's O2 sats again decreased to 77-83% on 6 L requiring repositioning and cueing for pursed lip  breathing (with extra time able to increase O2 sats to 91% on 6 L at rest end of session).  Nurse notified of pt's O2 sats during session (MD also notified via secure message).  Pt would benefit from skilled PT to address noted impairments and functional limitations (see below for any additional details).  Upon hospital discharge, pt would benefit from SNF.    Follow Up Recommendations SNF    Equipment Recommendations  None recommended by PT (pt has RW, BSC, and manual w/c at home already)    Recommendations for Other Services OT consult     Precautions / Restrictions Precautions Precautions: Fall Restrictions Weight Bearing Restrictions: Yes Other Position/Activity Restrictions: RUE rotator cuff injury limits weight bearing      Mobility  Bed Mobility Overal bed mobility: Needs Assistance Bed Mobility: Supine to Sit;Sit to Supine     Supine to sit: Mod assist;Max assist;HOB elevated Sit to supine: Mod assist;Max assist;HOB elevated   General bed mobility comments: assist for trunk semi-supine to sitting edge of bed and assist for trunk and B LE's sit to semi-supine in bed; 2 assist to boost pt up in bed to reposition in bed end of session    Transfers                 General transfer comment: Deferred d/t O2 desaturation with minimal activity  Ambulation/Gait             General Gait Details: deferred d/t O2 desaturation with minimal activity  Stairs            Wheelchair Mobility    Modified Rankin (Stroke Patients Only)       Balance Overall balance assessment: Needs assistance Sitting-balance support: No upper extremity supported;Feet supported Sitting balance-Leahy Scale: Fair Sitting balance - Comments: steady static sitting                                     Pertinent Vitals/Pain Pain Assessment: Faces Faces Pain Scale:  (2/10 at rest; 8/10 with R UE movement; 6/10 with L LE movement) Pain Location: R shoulder; L lower  leg Pain Descriptors / Indicators: Grimacing;Discomfort;Sharp;Shooting Pain Intervention(s): Limited activity within patient's tolerance;Monitored during session;Repositioned  HR WFL during sessions activities.    Home Living Family/patient expects to be discharged to:: Private residence Living Arrangements: Alone Available Help at Discharge: Personal care attendant;Available 24 hours/day Type of Home: House Home Access: Ramped entrance     Home Layout: One level Home Equipment: Walker - 4 wheels;Walker - 2 wheels;Shower seat;Bedside commode;Grab bars - toilet;Cane - single point;Walker - standard;Wheelchair - Building surveyor      Prior Function Level of Independence: Needs assistance   Gait / Transfers Assistance Needed: pt reports recently walking short distances with RW and assist; h/o falls  ADL's / Homemaking Assistance Needed: requiring assist with ADL's d/t R rotator cuff injury        Hand Dominance        Extremity/Trunk Assessment   Upper Extremity Assessment Upper Extremity Assessment: Defer to OT evaluation RUE Deficits / Details: R shoulder pain with R UE movement    Lower Extremity Assessment Lower Extremity Assessment: Generalized weakness    Cervical / Trunk Assessment Cervical / Trunk Assessment: Other exceptions Cervical / Trunk Exceptions: forward head/shoulders  Communication   Communication: No difficulties  Cognition Arousal/Alertness: Awake/alert Behavior During Therapy: WFL for tasks assessed/performed Overall Cognitive Status: Within Functional Limits for tasks assessed                                        General Comments General comments (skin integrity, edema, etc.): L LE dressing in place.  Nursing cleared pt for participation in physical therapy.  Pt agreeable to PT session.    Exercises  Pursed lip breathing   Assessment/Plan    PT Assessment Patient needs continued PT services  PT Problem List  Decreased strength;Decreased activity tolerance;Decreased balance;Decreased mobility;Decreased knowledge of use of DME;Cardiopulmonary status limiting activity;Decreased knowledge of precautions;Decreased skin integrity;Pain       PT Treatment Interventions DME instruction;Gait training;Functional mobility training;Therapeutic activities;Therapeutic exercise;Balance training;Patient/family education    PT Goals (Current goals can be found in the Care Plan section)  Acute Rehab PT Goals Patient Stated Goal: to go home PT Goal Formulation: With patient Time For Goal Achievement: 10/20/20 Potential to Achieve Goals: Fair    Frequency Min 2X/week   Barriers to discharge Decreased caregiver support Level of assist required    Co-evaluation               AM-PAC PT "6 Clicks" Mobility  Outcome Measure Help needed turning from your back to your side while in a flat bed without using bedrails?: A Little Help needed moving from lying on your back to sitting on the side of a flat bed without using bedrails?: A  Lot Help needed moving to and from a bed to a chair (including a wheelchair)?: A Lot Help needed standing up from a chair using your arms (e.g., wheelchair or bedside chair)?: A Lot Help needed to walk in hospital room?: Total Help needed climbing 3-5 steps with a railing? : Total 6 Click Score: 11    End of Session Equipment Utilized During Treatment: Gait belt;Oxygen (6 L O2 via nasal cannula) Activity Tolerance: Treatment limited secondary to medical complications (Comment) (O2 desaturation with minimal activity (nurse notified)) Patient left: in bed;with call bell/phone within reach;with bed alarm set Nurse Communication: Mobility status;Precautions;Other (comment) (pt's R shoulder pain and O2 sats during session) PT Visit Diagnosis: Other abnormalities of gait and mobility (R26.89);Muscle weakness (generalized) (M62.81);History of falling (Z91.81);Repeated falls  (R29.6);Pain Pain - Right/Left: Right Pain - part of body: Shoulder    Time: 0925-1010 PT Time Calculation (min) (ACUTE ONLY): 45 min   Charges:   PT Evaluation $PT Eval Low Complexity: 1 Low PT Treatments $Therapeutic Exercise: 8-22 mins       Hendricks Limes, PT 10/06/20, 10:41 AM

## 2020-10-06 NOTE — Progress Notes (Signed)
Tele Programmer, systems of a 10 beat run of [place]. MD at bedside doing assessment at time and made aware patient asymptomatic and talking with MD. Vitals WNL. Saturations 87-88% on 4L MD decreased oxygen rate from 6L. NAD noted.

## 2020-10-06 NOTE — Progress Notes (Signed)
Peripherally Inserted Central Catheter Placement  The IV Nurse has discussed with the patient and/or persons authorized to consent for the patient, the purpose of this procedure and the potential benefits and risks involved with this procedure.  The benefits include less needle sticks, lab draws from the catheter, and the patient may be discharged home with the catheter. Risks include, but not limited to, infection, bleeding, blood clot (thrombus formation), and puncture of an artery; nerve damage and irregular heartbeat and possibility to perform a PICC exchange if needed/ordered by physician.  Alternatives to this procedure were also discussed.  Bard Power PICC patient education guide, fact sheet on infection prevention and patient information card has been provided to patient /or left at bedside.    PICC Placement Documentation  PICC Double Lumen 10/06/20 PICC Right Basilic 41 cm 0 cm (Active)  Indication for Insertion or Continuance of Line Vasoactive infusions 10/06/20 1702  Exposed Catheter (cm) 0 cm 10/06/20 1702  Site Assessment Clean;Dry;Intact 10/06/20 1702  Lumen #1 Status Flushed;Saline locked;Blood return noted 10/06/20 1702  Lumen #2 Status Flushed;Saline locked;Blood return noted 10/06/20 1702  Dressing Type Transparent;Securing device 10/06/20 1702  Dressing Status Clean;Dry;Intact 10/06/20 1702  Antimicrobial disc in place? Yes 10/06/20 1702  Dressing Intervention New dressing 10/06/20 1702  Dressing Change Due 10/13/20 10/06/20 1702       Annett Fabian 10/06/2020, 5:04 PM

## 2020-10-06 NOTE — Progress Notes (Signed)
   09/27/2020 2009  Assess: MEWS Score  BP (!) 92/54  Pulse Rate 66  Resp (!) 21  SpO2 95 %  O2 Device Nasal Cannula  O2 Flow Rate (L/min) 6 L/min  Assess: MEWS Score  MEWS Temp 0  MEWS Systolic 1  MEWS Pulse 0  MEWS RR 1  MEWS LOC 0  MEWS Score 2  MEWS Score Color Yellow  Assess: if the MEWS score is Yellow or Red  Were vital signs taken at a resting state? No  Focused Assessment No change from prior assessment  Early Detection of Sepsis Score *See Row Information* Low  MEWS guidelines implemented *See Row Information* No, vital signs rechecked  Treat  MEWS Interventions Other (Comment) (Patient was agitated.)  Notify: Charge Nurse/RN  Name of Charge Nurse/RN Notified Melissa, RN  Date Charge Nurse/RN Notified 10/02/2020  Time Charge Nurse/RN Notified 2015  Document  Patient Outcome Stabilized after interventions  Progress note created (see row info) Yes

## 2020-10-07 ENCOUNTER — Inpatient Hospital Stay (HOSPITAL_COMMUNITY)
Admit: 2020-10-07 | Discharge: 2020-10-07 | Disposition: A | Discharge status: Home / Self Care | Payer: Medicare Other | Attending: Internal Medicine | Admitting: Internal Medicine

## 2020-10-07 DIAGNOSIS — D62 Acute posthemorrhagic anemia: Secondary | ICD-10-CM | POA: Diagnosis not present

## 2020-10-07 DIAGNOSIS — I5031 Acute diastolic (congestive) heart failure: Secondary | ICD-10-CM

## 2020-10-07 DIAGNOSIS — Z515 Encounter for palliative care: Secondary | ICD-10-CM

## 2020-10-07 DIAGNOSIS — S81802S Unspecified open wound, left lower leg, sequela: Secondary | ICD-10-CM | POA: Diagnosis not present

## 2020-10-07 DIAGNOSIS — Z7189 Other specified counseling: Secondary | ICD-10-CM

## 2020-10-07 DIAGNOSIS — J9601 Acute respiratory failure with hypoxia: Secondary | ICD-10-CM | POA: Diagnosis not present

## 2020-10-07 DIAGNOSIS — I480 Paroxysmal atrial fibrillation: Secondary | ICD-10-CM | POA: Diagnosis not present

## 2020-10-07 DIAGNOSIS — N179 Acute kidney failure, unspecified: Secondary | ICD-10-CM | POA: Diagnosis not present

## 2020-10-07 DIAGNOSIS — Z66 Do not resuscitate: Secondary | ICD-10-CM

## 2020-10-07 DIAGNOSIS — I509 Heart failure, unspecified: Secondary | ICD-10-CM | POA: Diagnosis not present

## 2020-10-07 DIAGNOSIS — I5030 Unspecified diastolic (congestive) heart failure: Secondary | ICD-10-CM

## 2020-10-07 DIAGNOSIS — I5033 Acute on chronic diastolic (congestive) heart failure: Secondary | ICD-10-CM | POA: Diagnosis not present

## 2020-10-07 LAB — CBC
HCT: 25.1 % — ABNORMAL LOW (ref 36.0–46.0)
Hemoglobin: 8.1 g/dL — ABNORMAL LOW (ref 12.0–15.0)
MCH: 31.8 pg (ref 26.0–34.0)
MCHC: 32.3 g/dL (ref 30.0–36.0)
MCV: 98.4 fL (ref 80.0–100.0)
Platelets: 157 10*3/uL (ref 150–400)
RBC: 2.55 MIL/uL — ABNORMAL LOW (ref 3.87–5.11)
RDW: 19.3 % — ABNORMAL HIGH (ref 11.5–15.5)
WBC: 4.5 10*3/uL (ref 4.0–10.5)
nRBC: 0.9 % — ABNORMAL HIGH (ref 0.0–0.2)

## 2020-10-07 LAB — ECHOCARDIOGRAM COMPLETE
Height: 66 in
S' Lateral: 2.51 cm
Weight: 2931.24 oz

## 2020-10-07 LAB — TYPE AND SCREEN
ABO/RH(D): A POS
Antibody Screen: NEGATIVE
Unit division: 0
Unit division: 0

## 2020-10-07 LAB — MAGNESIUM: Magnesium: 3.1 mg/dL — ABNORMAL HIGH (ref 1.7–2.4)

## 2020-10-07 LAB — BPAM RBC
Blood Product Expiration Date: 202206082359
Blood Product Expiration Date: 202206112359
ISSUE DATE / TIME: 202205161829
ISSUE DATE / TIME: 202205171747
Unit Type and Rh: 6200
Unit Type and Rh: 6200

## 2020-10-07 LAB — PHOSPHORUS: Phosphorus: 4.8 mg/dL — ABNORMAL HIGH (ref 2.5–4.6)

## 2020-10-07 LAB — BASIC METABOLIC PANEL
Anion gap: 9 (ref 5–15)
BUN: 41 mg/dL — ABNORMAL HIGH (ref 8–23)
CO2: 25 mmol/L (ref 22–32)
Calcium: 8.4 mg/dL — ABNORMAL LOW (ref 8.9–10.3)
Chloride: 105 mmol/L (ref 98–111)
Creatinine, Ser: 2.54 mg/dL — ABNORMAL HIGH (ref 0.44–1.00)
GFR, Estimated: 19 mL/min — ABNORMAL LOW (ref >60)
Glucose, Bld: 98 mg/dL (ref 70–99)
Potassium: 4 mmol/L (ref 3.5–5.1)
Sodium: 139 mmol/L (ref 135–145)

## 2020-10-07 LAB — FIBRINOGEN: Fibrinogen: 564 mg/dL — ABNORMAL HIGH (ref 210–475)

## 2020-10-07 LAB — PROTIME-INR
INR: 1.2 (ref 0.8–1.2)
Prothrombin Time: 14.8 seconds (ref 11.4–15.2)

## 2020-10-07 LAB — PROCALCITONIN: Procalcitonin: <0.1 ng/mL

## 2020-10-07 LAB — APTT: aPTT: 41 seconds — ABNORMAL HIGH (ref 24–36)

## 2020-10-07 MED ORDER — MORPHINE SULFATE (PF) 2 MG/ML IV SOLN
1.0000 mg | Freq: Four times a day (QID) | INTRAVENOUS | Status: DC | PRN
  Administered 2020-10-08 (×2): 1 mg via INTRAVENOUS
  Filled 2020-10-07 (×2): qty 1

## 2020-10-07 MED ORDER — DICLOFENAC SODIUM 1 % EX GEL
2.0000 g | Freq: Three times a day (TID) | CUTANEOUS | Status: DC
  Administered 2020-10-07 – 2020-10-08 (×3): 2 g via TOPICAL
  Filled 2020-10-07: qty 100

## 2020-10-07 MED ORDER — OXYCODONE HCL 5 MG PO TABS
10.0000 mg | ORAL_TABLET | Freq: Four times a day (QID) | ORAL | Status: DC | PRN
  Administered 2020-10-07 – 2020-10-08 (×2): 10 mg via ORAL
  Filled 2020-10-07 (×2): qty 2

## 2020-10-07 MED ORDER — MORPHINE SULFATE (PF) 2 MG/ML IV SOLN
1.0000 mg | Freq: Three times a day (TID) | INTRAVENOUS | Status: DC | PRN
  Administered 2020-10-07: 1 mg via INTRAVENOUS
  Filled 2020-10-07: qty 1

## 2020-10-07 MED ORDER — MORPHINE SULFATE (PF) 2 MG/ML IV SOLN
2.0000 mg | Freq: Once | INTRAVENOUS | Status: AC
  Administered 2020-10-07: 2 mg via INTRAVENOUS
  Filled 2020-10-07: qty 1

## 2020-10-07 MED FILL — Sodium Chloride IV Soln 0.9%: INTRAVENOUS | Qty: 250 | Status: AC

## 2020-10-07 MED FILL — Phenylephrine HCl IV Soln 10 MG/ML: INTRAVENOUS | Qty: 1 | Status: AC
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
## 2020-10-07 NOTE — Consult Note (Signed)
WOC Nurse wound follow up Please see note from my partner, L. McNichol on 10-26-20.  Orders have been implemented and align with wound care center recommendations. Silver hydrofiber and compression with kerlix and ABD pads.  Orders are clearly outlined in wound care orders.  Patient will follow up with wound care center after discharge.  Bedside staff to perform dressing changes.  Will not follow at this time.  Please re-consult if needed.  Maple Hudson MSN, RN, FNP-BC CWON Wound, Ostomy, Continence Nurse Pager [phone]
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
## 2020-10-07 NOTE — Progress Notes (Signed)
Progress Note  Patient Name: [name] Date of Encounter: 10/07/2020  [facility] Cardiologist: Julien Nordmann, MD   Subjective   Feels much better compared to admission.  Breathing is also better.  Denies palpitations.  On antibiotics for possible wound infection  Inpatient Medications    Scheduled Meds: . sodium chloride   Intravenous Once  . albuterol  2 puff Inhalation Q6H WA  . Chlorhexidine Gluconate Cloth  6 each Topical Daily  . diclofenac Sodium  2 g Topical TID  . estradiol  1.5 mg Oral Daily  . flecainide  50 mg Oral Q12H  . FLUoxetine  20 mg Oral Daily  . fluticasone  2 spray Each Nare BID  . fluticasone  1 puff Inhalation BID  . furosemide  40 mg Intravenous Daily  . loratadine  10 mg Oral Daily  . magic mouthwash  15 mL Oral TID  . montelukast  10 mg Oral QHS  . oxybutynin  5 mg Oral QPM  . pantoprazole  40 mg Oral QAC breakfast  . pramipexole  0.5 mg Oral QID  . sodium chloride flush  10-40 mL Intracatheter Q12H  . sodium chloride flush  3 mL Intravenous Q12H  . sucralfate  1 g Oral TID   Continuous Infusions: . sodium chloride    . ceFEPime (MAXIPIME) IV 2 g (10/06/20 1810)  . vancomycin 1,000 mg (10/07/20 0915)   PRN Meds: sodium chloride, acetaminophen, ALPRAZolam, carbamazepine, colchicine, ondansetron (ZOFRAN) IV, ondansetron, oxyCODONE, sodium chloride, sodium chloride flush, sodium chloride flush, SUMAtriptan, tiZANidine, traZODone   Vital Signs    Vitals:   10/07/20 1000 10/07/20 1100 10/07/20 1200 10/07/20 1300  BP: 131/72  (!) 108/54 (!) 147/108  Pulse: 83 83 80 92  Resp: 18 14 17 18   Temp:      TempSrc:      SpO2: 97% 97% 97% 98%  Weight:      Height:        Intake/Output Summary (Last 24 hours) at 10/07/2020 1309 Last data filed at 10/07/2020 0900 Gross per 24 hour  Intake 1050.72 ml  Output 350 ml  Net 700.72 ml   Last 3 Weights 10/07/2020 10/06/2020 10/06/2020  Weight (lbs) 183 lb 3.2 oz 185 lb 10 oz 176 lb 3.2 oz   Weight (kg) 83.1 kg 84.2 kg 79.924 kg      Telemetry    Sinus rhythm- Personally Reviewed  ECG    No new tracing reviewed- Personally Reviewed  Physical Exam   GEN: No acute distress.   Neck: No JVD Cardiac: RRR, no murmurs Respiratory: Clear to auscultation bilaterally. GI: Soft, nontender, non-distended  MS: No edema; left leg wrapped in dressing Neuro:  Nonfocal  Psych: Normal affect   Labs    High Sensitivity Troponin:   Recent Labs  Lab 09/12/20 0949 09/24/2020 1145 09/26/2020 1636 10/06/20 1358 10/06/20 1803  TROPONINIHS 17 29* 27* 18* 18*      Chemistry Recent Labs  Lab 10/06/20 0357 10/06/20 1358 10/07/20 0435  NA 138 136 139  K 4.6 5.0 4.0  CL 103 100 105  CO2 27 25 25   GLUCOSE 115* 119* 98  BUN 41* 44* 41*  CREATININE 2.62* 2.86* 2.54*  CALCIUM 8.7* 8.8* 8.4*  PROT  --  5.3*  --   ALBUMIN  --  2.5*  --   AST  --  25  --   ALT  --  23  --   ALKPHOS  --  69  --  BILITOT  --  0.9  --   GFRNONAA 18* 16* 19*  ANIONGAP 8 11 9      Hematology Recent Labs  Lab 10/14/2020 1145 09/28/2020 1806 10/06/20 0823 10/06/20 1244 10/06/20 1358 10/07/20 0435  WBC 6.9  --   --   --  5.4 4.5  RBC 2.34*  --   --   --  2.48* 2.55*  HGB 7.4*   < > 7.8* 7.5* 7.9* 8.1*  HCT 25.0*   < > 24.5*  --  25.0* 25.1*  MCV 106.8*  --   --   --  100.8* 98.4  MCH 31.6  --   --   --  31.9 31.8  MCHC 29.6*  --   --   --  31.6 32.3  RDW 18.0*  --   --   --  19.9* 19.3*  PLT 208  --   --   --  181 157   < > = values in this interval not displayed.    BNP Recent Labs  Lab 10/19/2020 1145  BNP 2,283.6*     DDimer No results for input(s): DDIMER in the last 168 hours.   Radiology    DG Shoulder Right  Result Date: 10/06/2020 CLINICAL DATA:  RIGHT shoulder pain. EXAM: RIGHT SHOULDER - 2+ VIEW COMPARISON:  Chest x-ray on 09/22/2020 FINDINGS: There are mild degenerative changes in the shoulder, similar in appearance to the prior study. No acute fracture or subluxation.  RIGHT lung apex is unremarkable. IMPRESSION: No evidence for acute  abnormality. Electronically Signed   By: 10/07/2020 M.D.   On: 10/06/2020 14:43   DG Chest Port 1 View  Result Date: 10/06/2020 CLINICAL DATA:  PICC line placement. EXAM: PORTABLE CHEST 1 VIEW COMPARISON:  Earlier film, same date. FINDINGS: The right PICC line is in good position with the tip in the distal SVC near the cavoatrial junction. Stable cardiac enlargement, pulmonary infiltrates and pleural effusions. IMPRESSION: Right PICC line in good position with the tip in the distal SVC near the cavoatrial junction. Electronically Signed   By: 10/10/2020 M.D.   On: 10/06/2020 17:28   DG Chest Port 1 View  Result Date: 10/06/2020 CLINICAL DATA:  Acute respiratory failure.  Hypoxia. EXAM: PORTABLE CHEST 1 VIEW COMPARISON:  09/27/2020 FINDINGS: Stable cardiomegaly. Patchy infiltrates are seen bilaterally, increased in mid lung zones compared to prior studies. Patient is rotated towards the LEFT. IMPRESSION: Increased pulmonary infiltrates.  Stable cardiomegaly. Electronically Signed   By: 10/07/2020 M.D.   On: 10/06/2020 14:44   10/01/2020 EKG SITE RITE  Result Date: 10/06/2020 If Site Rite image not attached, placement could not be confirmed due to current cardiac rhythm.   Cardiac Studies   Echo 02/2020 1. Left ventricular ejection fraction, by estimation, is 65 to 70%. The  left ventricle has normal function. The left ventricle has no regional  wall motion abnormalities. There is moderate asymmetric left ventricular  hypertrophy of the septal segment.  Left ventricular diastolic parameters are consistent with Grade II  diastolic dysfunction (pseudonormalization). Elevated left atrial  pressure.  2. Right ventricular systolic function is normal. The right ventricular  size is normal. Mildly increased right ventricular wall thickness.  Tricuspid regurgitation signal is inadequate for assessing PA pressure.  3. Left  atrial size was moderately dilated.  4. Right atrial size was mildly dilated.  5. The mitral valve is degenerative. Trivial mitral valve regurgitation.  No evidence of mitral stenosis.  6. The aortic  valve is tricuspid. There is mild calcification of the  aortic valve. There is mild thickening of the aortic valve. Aortic valve  regurgitation is mild to moderate. Mild to moderate aortic valve  sclerosis/calcification is present, without  any evidence of aortic stenosis.  7. The inferior vena cava is normal in size with <50% respiratory  variability, suggesting right atrial pressure of 8 mmHg.  Patient Profile     80 y.o. female with history of paroxysmal A. fib, CKD 3, HFpEF, hypertension presenting with hypotension, and weakness.  Diagnosed with sepsis, likely from wound source.  Also anemia secondary to blood loss from wound laceration.  Assessment & Plan    1.  Paroxysmal A. Fib -Maintaining sinus rhythm -Holding Eliquis due to blood loss anemia requiring transfusion -Continue flecainide  2.  Questionable wide-complex tachycardia -No arrhythmias noted on telemetry -Evaluated by EP, likely A. fib with aberrancy. -Continue flecainide as prescribed. -If BP permits, can add PTA Lopressor  3.  HFpEF -Shortness of breath improved -Creatinine improving on IV Lasix -Switch to p.o. in a day or so.  4.  Lower extremity wound, possible infection -Antibiotics as per ID  Total encounter time 35 minutes  Greater than 50% was spent in counseling and coordination of care with the patient    Signed, Debbe Odea, MD  10/07/2020, 1:09 PM

## 2020-10-07 NOTE — Progress Notes (Signed)
*  PRELIMINARY RESULTS* Echocardiogram 2D Echocardiogram has been performed.  Cristela Blue 10/07/2020, 7:57 AM

## 2020-10-07 NOTE — Progress Notes (Signed)
[facility] INFECTIOUS DISEASE PROGRESS NOTE Date of Admission:  09/26/2020     ID: [name] is a 80 y.o. female with wound infection Active Problems:   Acute kidney injury superimposed on CKD (HCC)   CHF exacerbation (HCC)   Hypotension   Ventricular tachycardia (HCC)   Acute blood loss anemia   Open wound of left lower leg   Paroxysmal A-fib (HCC)   Heart failure with preserved ejection fraction (HCC)   Subjective: No fevers, wbc remains nml. Resp status improving.  Having a lot of shoulder pain.   ROS  Eleven systems are reviewed and negative except per hpi  Medications:  Antibiotics Given (last 72 hours)    Date/Time Action Medication Dose Rate   10/20/2020 1455 New Bag/Given   vancomycin (VANCOCIN) IVPB 1000 mg/200 mL premix 1,000 mg 200 mL/hr   09/20/2020 2053 Given   clindamycin (CLEOCIN) capsule 300 mg 300 mg    10/06/20 0622 Given   clindamycin (CLEOCIN) capsule 300 mg 300 mg    10/06/20 1119 Given   clindamycin (CLEOCIN) capsule 300 mg 300 mg    10/06/20 1810 New Bag/Given   ceFEPIme (MAXIPIME) 2 g in sodium chloride 0.9 % 100 mL IVPB 2 g 200 mL/hr   10/07/20 0915 New Bag/Given   vancomycin (VANCOREADY) IVPB 1000 mg/200 mL 1,000 mg 200 mL/hr     . sodium chloride   Intravenous Once  . albuterol  2 puff Inhalation Q6H WA  . Chlorhexidine Gluconate Cloth  6 each Topical Daily  . diclofenac Sodium  2 g Topical TID  . estradiol  1.5 mg Oral Daily  . flecainide  50 mg Oral Q12H  . FLUoxetine  20 mg Oral Daily  . fluticasone  2 spray Each Nare BID  . fluticasone  1 puff Inhalation BID  . furosemide  40 mg Intravenous Daily  . loratadine  10 mg Oral Daily  . magic mouthwash  15 mL Oral TID  . montelukast  10 mg Oral QHS  . oxybutynin  5 mg Oral QPM  . pantoprazole  40 mg Oral QAC breakfast  . pramipexole  0.5 mg Oral QID  . sodium chloride flush  10-40 mL Intracatheter Q12H  . sodium chloride flush  3 mL Intravenous Q12H  . sucralfate  1 g Oral TID     Objective: Vital signs in last 24 hours: Temp:  [97.8 F (36.6 C)-98.6 F (37 C)] 98.5 F (36.9 C) (05/18 1200) Pulse Rate:  [49-92] 91 (05/18 1500) Resp:  [12-31] 25 (05/18 1500) BP: (79-147)/(36-108) 127/69 (05/18 1500) SpO2:  [93 %-100 %] 98 % (05/18 1500) FiO2 (%):  [35 %] 35 % (05/17 2100) Weight:  [83.1 kg] 83.1 kg (05/18 0316) Constitutional:  Fully awake, interactive.  In pain from her shoulder.  Tearful HENT: [place]/AT, PERRLA, no scleral icterus Mouth/Throat: Oropharynx is clear and moist. No oropharyngeal exudate.  Cardiovascular: Normal rate, regular rhythm and normal heart sounds. Pulmonary/Chest: Effort normal and breath sounds normal. No respiratory distress.  has no wheezes.  Neck = supple, no nuchal rigidity Abdominal: Soft. Bowel sounds are normal.  exhibits no distension. There is no tenderness.  Lymphadenopathy: no cervical adenopathy. No axillary adenopathy Neurological: alert and interactive Skin: L leg see photos   Lab Results Recent Labs    10/06/20 1358 10/07/20 0435  WBC 5.4 4.5  HGB 7.9* 8.1*  HCT 25.0* 25.1*  NA 136 139  K 5.0 4.0  CL 100 105  CO2 25 25  BUN 44* 41*  CREATININE 2.86* 2.54*    Microbiology: Results for orders placed or performed during the hospital encounter of 30-Oct-2020  SARS CORONAVIRUS 2 (TAT 6-24 HRS) Nasopharyngeal Nasopharyngeal Swab     Status: None   Collection Time: 10/30/2020  4:36 PM   Specimen: Nasopharyngeal Swab  Result Value Ref Range Status   SARS Coronavirus 2 NEGATIVE NEGATIVE Final    Comment: (NOTE) SARS-CoV-2 target nucleic acids are NOT DETECTED.  The SARS-CoV-2 RNA is generally detectable in upper and lower respiratory specimens during the acute phase of infection. Negative results do not preclude SARS-CoV-2 infection, do not rule out co-infections with other pathogens, and should not be used as the sole basis for treatment or other patient management decisions. Negative results must be combined  with clinical observations, patient history, and epidemiological information. The expected result is Negative.  Fact Sheet for Patients: HairSlick.no  Fact Sheet for Healthcare Providers: quierodirigir.com  This test is not yet approved or cleared by the [place] FDA and  has been authorized for detection and/or diagnosis of SARS-CoV-2 by FDA under an Emergency Use Authorization (EUA). This EUA will remain  in effect (meaning this test can be used) for the duration of the COVID-19 declaration under Se ction 564(b)(1) of the Act, 21 U.S.C. section 360bbb-3(b)(1), unless the authorization is terminated or revoked sooner.  Performed at [facility], [address], [place], [place] [address]   CULTURE, BLOOD (ROUTINE X 2) w Reflex to ID Panel     Status: None (Preliminary result)   Collection Time: 10/06/20  8:12 AM   Specimen: BLOOD  Result Value Ref Range Status   Specimen Description BLOOD LEFT ANTECUBITAL  Final   Special Requests   Final    BOTTLES DRAWN AEROBIC AND ANAEROBIC Blood Culture results may not be optimal due to an excessive volume of blood received in culture bottles   Culture   Final    NO GROWTH < 24 HOURS Performed at [facility], [address], [place], [place] [address]    Report Status PENDING  Incomplete  CULTURE, BLOOD (ROUTINE X 2) w Reflex to ID Panel     Status: None (Preliminary result)   Collection Time: 10/06/20  8:21 AM   Specimen: BLOOD  Result Value Ref Range Status   Specimen Description BLOOD LEFT ANTECUBITAL  Final   Special Requests   Final    BOTTLES DRAWN AEROBIC AND ANAEROBIC Blood Culture results may not be optimal due to an excessive volume of blood received in culture bottles   Culture   Final    NO GROWTH < 24 HOURS Performed at [facility], [address] [place], [place] [address]    Report Status PENDING  Incomplete  Aerobic Culture w Gram  Stain (superficial specimen)     Status: None (Preliminary result)   Collection Time: 10/06/20  2:54 PM   Specimen: Wound  Result Value Ref Range Status   Specimen Description   Final    WOUND Performed at [facility], [address], [place], [place] [address]    Special Requests   Final    LEFT LEG Performed at [facility], [address] [place], [place] [address]    Gram Stain   Final    FEW WBC PRESENT, PREDOMINANTLY PMN RARE GRAM POSITIVE COCCI IN PAIRS    Culture   Final    TOO YOUNG TO READ Performed at [facility], [address] [place], [place] [address]    Report Status PENDING  Incomplete  MRSA PCR Screening     Status: None   Collection Time: 10/06/20  2:55 PM   Specimen: Nasal Mucosa; Nasopharyngeal  Result Value Ref Range Status   MRSA by PCR NEGATIVE NEGATIVE Final    Comment:        The GeneXpert MRSA Assay (FDA approved for NASAL specimens only), is one component of a comprehensive MRSA colonization surveillance program. It is not intended to diagnose MRSA infection nor to guide or monitor treatment for MRSA infections. Performed at [facility], [address] [place], [place] [address]     Studies/Results: [place] Shoulder Right  Result Date: 10/06/2020 CLINICAL DATA:  RIGHT shoulder pain. EXAM: RIGHT SHOULDER - 2+ VIEW COMPARISON:  Chest x-ray on 09/20/2020 FINDINGS: There are mild degenerative changes in the shoulder, similar in appearance to the prior study. No acute fracture or subluxation. RIGHT lung apex is unremarkable. IMPRESSION: No evidence for acute  abnormality. Electronically Signed   By: Norva PavlovElizabeth  Brown M.D.   On: 10/06/2020 14:43   DG Chest Port 1 View  Result Date: 10/06/2020 CLINICAL DATA:  PICC line placement. EXAM: PORTABLE CHEST 1 VIEW COMPARISON:  Earlier film, same date. FINDINGS: The right PICC line is in good position with the tip in the distal SVC near the cavoatrial junction. Stable  cardiac enlargement, pulmonary infiltrates and pleural effusions. IMPRESSION: Right PICC line in good position with the tip in the distal SVC near the cavoatrial junction. Electronically Signed   By: Rudie MeyerP.  Gallerani M.D.   On: 10/06/2020 17:28   DG Chest Port 1 View  Result Date: 10/06/2020 CLINICAL DATA:  Acute respiratory failure.  Hypoxia. EXAM: PORTABLE CHEST 1 VIEW COMPARISON:  10/17/2020 FINDINGS: Stable cardiomegaly. Patchy infiltrates are seen bilaterally, increased in mid lung zones compared to prior studies. Patient is rotated towards the LEFT. IMPRESSION: Increased pulmonary infiltrates.  Stable cardiomegaly. Electronically Signed   By: Norva PavlovElizabeth  Brown M.D.   On: 10/06/2020 14:44   ECHOCARDIOGRAM COMPLETE  Result Date: 10/07/2020    ECHOCARDIOGRAM REPORT   Patient Name:   Jacquelene B Klee Date of Exam: 10/07/2020 Medical Rec #:  [id number]   Height:       66.0 in Accession #:    [id number]  Weight:       183.2 lb Date of Birth:  1941/04/23   BSA:          1.927 m Patient Age:    89 years    BP:           115/58 mmHg Patient Gender: F           HR:           79 bpm. Exam Location:  [facility] Procedure: 2D Echo, Color Doppler and Cardiac Doppler Indications:     CHF-acute diastolic I50.31  History:         Patient has prior history of Echocardiogram examinations, most                  recent 03/13/2020. Risk Factors:Hypertension.  Sonographer:     Cristela BlueJerry Hege RDCS (AE) Referring Phys:  [phone] CHRISTOPHER END Diagnosing Phys: Debbe OdeaBrian Agbor-Etang MD  Sonographer Comments: No apical window. IMPRESSIONS  1. Left ventricular ejection fraction, by estimation, is 60 to 65%. The left ventricle has normal function. The left ventricle has no regional wall motion abnormalities. There is mild left ventricular hypertrophy. Left ventricular diastolic parameters are indeterminate.  2. Right ventricular systolic function is normal. The right ventricular size is normal.  3. Left atrial size was moderately dilated.  4. The mitral  valve is normal in structure. Trivial mitral valve regurgitation.  5. The aortic valve is tricuspid. Aortic valve regurgitation is mild to moderate. Mild aortic valve sclerosis is present, with no evidence of aortic valve stenosis.  6. The inferior vena cava is dilated in size with <50% respiratory variability, suggesting right atrial pressure of 15 mmHg. FINDINGS  Left Ventricle: Left ventricular ejection fraction, by estimation, is 60 to 65%. The left ventricle has normal function. The left ventricle has no regional wall motion abnormalities. The left ventricular internal cavity size was normal in size. There is  mild left ventricular hypertrophy. Left ventricular diastolic parameters are indeterminate. Right Ventricle: The right ventricular size is normal. No increase in right ventricular wall thickness. Right ventricular systolic function is normal. Left Atrium: Left atrial size was moderately dilated. Right Atrium: Right atrial size was not well visualized. Pericardium: There is no evidence of pericardial effusion. Mitral Valve: The mitral valve is normal in structure. Trivial mitral valve regurgitation. Tricuspid Valve: The tricuspid valve is normal in structure. Tricuspid valve regurgitation is mild. Aortic Valve: The aortic valve is tricuspid. Aortic valve regurgitation is mild to moderate. Mild aortic valve sclerosis is present, with no evidence of aortic valve stenosis. Pulmonic Valve: The pulmonic valve was normal in structure. Pulmonic valve regurgitation is trivial. Aorta: The aortic root is normal in size and structure. Venous: The inferior vena cava is dilated in size with less than 50% respiratory variability, suggesting right atrial pressure of 15 mmHg. IAS/Shunts: No atrial level shunt detected by color flow Doppler.  LEFT VENTRICLE PLAX 2D LVIDd:         4.53 cm LVIDs:         2.51 cm LV PW:         1.24 cm LV IVS:        1.49 cm LVOT diam:     2.00 cm LVOT Area:     3.14 cm  LEFT ATRIUM          Index LA diam:    4.40 cm 2.28 cm/m                        PULMONIC VALVE AORTA                 PV Vmax:        0.65 m/s Ao Root diam: 3.20 cm PV Peak grad:   1.7 mmHg                       RVOT Peak grad: 2 mmHg  TRICUSPID VALVE TR Peak grad:   34.3 mmHg TR Vmax:        293.00 cm/s  SHUNTS Systemic Diam: 2.00 cm Debbe Odea MD Electronically signed by Debbe Odea MD Signature Date/Time: 10/07/2020/1:49:04 PM    Final    [place] EKG SITE RITE  Result Date: 10/06/2020 If Site Rite image not attached, placement could not be confirmed due to current cardiac rhythm.   Assessment/Plan: ASANTI CRAIGO is a 80 y.o. female with  chronic respiratory failure on 1 to 2 L/min of oxygen at home,atrial fibrillation on chronic anticoagulation therapywith xarelto, history of CHF(LVEF of 55-60% in 02/2020), hypertension, recent admission for COVID 19 barely 2 weeks ago. She apparently fell at home about 2 weeks ago and  sustained a laceration injury to her shin with significant bleeding reported.  Now admitted with SOB and found to have AKI, worsening resp failure hypotension, anemia and CHF exac with elevated BNP.  She has two open wounds on her LE from her recent fall. There is some drainage and they are quite tender. Could be a source of sepsis.  5/18 no fevers, wbcn ml cx of wound with GPC in pairs Recommendations Cont vanco - can change to dapto for renal if needed Cont  cefepime. Monitor closely. May need MRI leg at some point once stablized to eval for underlying osteo.   Thank you very much for the consult. Will follow with you.  Mick Sell   10/07/2020, 3:19 PM

## 2020-10-07 NOTE — Consult Note (Signed)
Consultation Note Date: 10/07/2020   Patient Name: [name]  DOB: [birth year]  MRN: [id number]  Age / Sex: 80 y.o., female   PCP: Rusty Aus, MD Referring Physician: Fritzi Mandes, MD   REASON FOR CONSULTATION:Establishing goals of care  Palliative Care consult requested for goals of care discussion in this 80 y.o. female with a medical history significant for atrial fibrillation (Xarelto), diastolic CHF, hypertension, chronic respiratory failure (1-2 L), recent COVID 19, anxiety, and GERD.  Patient was recently discharged from hospital after COVID-19 approximately 2 weeks ago.  She presented to the ED via EMS from home due to shortness of breath.  Patient has lower extremity injury s/p fall x2 weeks ago.  It was reported she was being followed by the wound care clinic and a home health nurse, currently on Bactrim.  Home health nurse noted patient to be hypoxic providing recommendations for ER work-up after discussion with patient's PCP.  Chest x-ray sequela from recent COVID infection.  BUN 43.  Creatinine 2.6.  BNP 2283.  Hemoglobin 7.4.  Patient recently transferred to ICU (10/06/2020) with possible ventricular arrhythmia and hypotension.  Clinical Assessment and Goals of Care: I have reviewed medical records including lab results, imaging, Epic notes, and MAR, received report from the bedside RN, and assessed the patient.   I met at the bedside with patient, Ulis Rias (bestfriend/HCPOA), her son, Reyne Dumas and his wife Izora Gala to discuss diagnosis prognosis, [place], EOL wishes, disposition and options.  Patient is awake, alert and oriented x3.  She is laughing and joking with family.  States she feels much better today.  Throughout conversation questionable confusion given answers and responses intermittently.  I introduced Palliative Medicine as specialized medical care for people living with serious illness. It focuses on providing relief from the symptoms and stress of a serious illness. The  goal is to improve quality of life for both the patient and the family.  We discussed a brief life review of the patient, along with her functional and nutritional status.  Patient is widowed and has 1 son.  She lives alone with her 2 cats.  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  Prior to admission patient was ambulatory with walker in her home.  Most recently family reports recurrent falls which resulted in large leg wound.  She has 2 friends who come over daily to check in on her, assist with errands, and medical appointments.  She requires assistance with taking showers.  Patient reports increase in fatigue and dyspnea with exertion x3 weeks.  She was discharged home previous admission after being treated for COVID-19 with oxygen.  Ulis Rias shares patient was not as compliant as she should be with wearing during sleep.  We discussed Her current illness and what it means in the larger context of Her on-going co-morbidities. Natural disease trajectory and expectations at EOL were discussed.  Family verbalized understanding of patient's current illness and comorbidities.  They expressed significant concern on yesterday and was unsure if patient would survive but is grateful for her improvement on today.  We discussed at length patient condition remains unstable although somewhat improved.  Patient seems to be focused on going home and throughout conversations would say she does not want to talk about certain things.  A detailed discussion was had today regarding advanced directives.  Concepts specific to code status, artifical feeding and hydration, continued IV  antibiotics and rehospitalization.  Patient has a documented advanced directive which was reviewed by myself.  Her best friend Joellen Jersey is her designated Education officer, community.  Patient and family confirms DNR/DNI, no artificial feeding/PEG.  The difference  between a aggressive medical intervention and a palliative comfort care path were discussed at length. Values and goals of care important to patient and family were attempted to be elicited.   Patient and family are clear and expressed goals to continue to treat the treatable allow patient every opportunity to have the ability to thrive.  They again expressed their appreciation of her improvement compared to yesterday.  They understand although patient condition seems better today she still has quite a bit a ways to go and most likely with a new baseline.  Although family is remaining hopeful they are also preparing for the worst (no improvement/further decline).  Express goal is for watchful waiting, continue to treat the treatable, with a goal of discharging to SNF for rehabilitation if recommended.  Patient continues to state her wishes are to go home so that she can get back to her sewing.  I discussed at length options of going home and no aggressive interventions focusing on her comfort.  Hospice and palliative care services outpatient were explained and offered.  Patient and family verbalized understanding and awareness of both palliative and hospice's goals and philosophy of care.  Patient is clear and expressed goals she is not interested in hospice or comfort care at this time.  She is in agreement with all recommended care including rehab if required sharing she was appreciative of her previous quality of life despite some medical decline.   I discussed the importance of continued conversation with family and their medical providers regarding overall plan of care and treatment options, ensuring decisions are within the context of the patients values and GOCs.  Questions and concerns were addressed. The family was encouraged to call with questions or concerns.  PMT will continue to support holistically as needed.   CODE STATUS: DNR  ADVANCE DIRECTIVES: Primary Decision Maker: Mirian Capuchin  (friend/POA)   SYMPTOM MANAGEMENT: Per attending  Palliative Prophylaxis:   Aspiration, Bowel Regimen, Delirium Protocol, Eye Care, Frequent Pain Assessment, Oral Care, Palliative Wound Care and Turn Reposition  PSYCHO-SOCIAL/SPIRITUAL:  Support System: Family  Desire for further Chaplaincy support: No  Additional Recommendations (Limitations, Scope, Preferences):  No Artificial Feeding and DNR/DNI, continue to treat the treatable  Education on hospice/palliative    PAST MEDICAL HISTORY: Past Medical History:  Diagnosis Date  . A-fib ([place]) 2012, 2014  . Anxiety   . Arrhythmia    A-fib  . Bursitis    hips  . Chronic diastolic (congestive) heart failure ([place])   . Concussion   . Concussion July, 28, 2014  . GERD (gastroesophageal reflux disease)   . Hypertension   . Palpitations   . Pneumonia 2012    ALLERGIES:  is allergic to penicillins, amlodipine, clarithromycin, minocycline, propoxyphene, ace inhibitors, norvasc [amlodipine besylate], vicodin [hydrocodone-acetaminophen], betadine [povidone iodine], iodine, and sulfa antibiotics.   MEDICATIONS:  Current Facility-Administered Medications  Medication Dose Route Frequency Provider Last Rate Last Admin  . 0.9 %  sodium chloride infusion (Manually program via Guardrails IV Fluids)   Intravenous Once Acheampong, Warnell Bureau, MD      . 0.9 %  sodium chloride infusion  250 mL Intravenous PRN Acheampong, Warnell Bureau, MD      . acetaminophen (TYLENOL) tablet 1,000 mg  1,000 mg Oral TID PRN Acheampong,  Warnell Bureau, MD   1,000 mg at 10/16/2020 1809  . albuterol (VENTOLIN HFA) 108 (90 Base) MCG/ACT inhaler 2 puff  2 puff Inhalation Q6H WA Acheampong, Warnell Bureau, MD   2 puff at 10/07/20 1337  . ALPRAZolam Duanne Moron) tablet 0.25 mg  0.25 mg Oral TID PRN Acheampong, Warnell Bureau, MD      . carbamazepine (TEGRETOL XR) 12 hr tablet 100 mg  100 mg Oral BID PRN Acheampong, Warnell Bureau, MD      . ceFEPIme (MAXIPIME) 2 g in sodium chloride 0.9 % 100 mL IVPB  2 g  Intravenous Q24H Loletha Grayer, MD 200 mL/hr at 10/06/20 1810 2 g at 10/06/20 1810  . Chlorhexidine Gluconate Cloth 2 % PADS 6 each  6 each Topical Daily Loletha Grayer, MD   6 each at 10/06/20 1310  . colchicine tablet 0.6 mg  0.6 mg Oral Daily PRN Acheampong, Warnell Bureau, MD      . diclofenac Sodium (VOLTAREN) 1 % topical gel 2 g  2 g Topical TID Fritzi Mandes, MD   2 g at 10/07/20 1338  . estradiol (ESTRACE) tablet 1.5 mg  1.5 mg Oral Daily Acheampong, Warnell Bureau, MD   1.5 mg at 10/07/20 0907  . flecainide (TAMBOCOR) tablet 50 mg  50 mg Oral Q12H Acheampong, Warnell Bureau, MD   50 mg at 10/07/20 0530  . FLUoxetine (PROZAC) capsule 20 mg  20 mg Oral Daily Acheampong, Warnell Bureau, MD   20 mg at 10/07/20 0907  . fluticasone (FLONASE) 50 MCG/ACT nasal spray 2 spray  2 spray Each Nare BID Acheampong, Warnell Bureau, MD   2 spray at 10/07/20 0908  . fluticasone (FLOVENT HFA) 220 MCG/ACT inhaler 1 puff  1 puff Inhalation BID Acheampong, Warnell Bureau, MD   1 puff at 10/07/20 0824  . furosemide (LASIX) injection 40 mg  40 mg Intravenous Daily Acheampong, Warnell Bureau, MD   40 mg at 10/07/20 0909  . loratadine (CLARITIN) tablet 10 mg  10 mg Oral Daily Acheampong, Warnell Bureau, MD   10 mg at 10/07/20 0907  . magic mouthwash  15 mL Oral TID Renda Rolls, RPH   15 mL at 10/07/20 0908  . montelukast (SINGULAIR) tablet 10 mg  10 mg Oral QHS Artist Beach, MD   10 mg at 10/06/20 2125  . morphine 2 MG/ML injection 1 mg  1 mg Intravenous Q8H PRN Fritzi Mandes, MD      . ondansetron [facility]) injection 4 mg  4 mg Intravenous Q6H PRN Acheampong, Warnell Bureau, MD      . ondansetron [facility]) tablet 4 mg  4 mg Oral Q6H PRN Acheampong, Warnell Bureau, MD      . oxybutynin (DITROPAN) tablet 5 mg  5 mg Oral QPM Acheampong, Warnell Bureau, MD   5 mg at 09/21/2020 2151  . oxyCODONE (Oxy IR/ROXICODONE) immediate release tablet 5 mg  5 mg Oral Q6H PRN Loletha Grayer, MD   5 mg at 10/07/20 0920  . pantoprazole (PROTONIX) EC tablet 40 mg  40 mg Oral QAC breakfast Acheampong,  Warnell Bureau, MD   40 mg at 10/07/20 4315  . pramipexole (MIRAPEX) tablet 0.5 mg  0.5 mg Oral QID Artist Beach, MD   0.5 mg at 10/07/20 1300  . sodium chloride (OCEAN) 0.65 % nasal spray 1 spray  1 spray Each Nare PRN Acheampong, Warnell Bureau, MD      . sodium chloride flush (NS) 0.9 % injection 10-40 mL  10-40 mL Intracatheter  Q12H Loletha Grayer, MD   10 mL at 10/07/20 0908  . sodium chloride flush (NS) 0.9 % injection 10-40 mL  10-40 mL Intracatheter PRN Wieting, Richard, MD      . sodium chloride flush (NS) 0.9 % injection 3 mL  3 mL Intravenous Q12H Acheampong, Warnell Bureau, MD   3 mL at 10/07/20 0909  . sodium chloride flush (NS) 0.9 % injection 3 mL  3 mL Intravenous PRN Acheampong, Warnell Bureau, MD      . sucralfate (CARAFATE) tablet 1 g  1 g Oral TID Artist Beach, MD   1 g at 10/07/20 1337  . SUMAtriptan (IMITREX) tablet 50 mg  50 mg Oral Daily PRN Acheampong, Warnell Bureau, MD      . tiZANidine (ZANAFLEX) tablet 2 mg  2 mg Oral TID PRN Artist Beach, MD   2 mg at 10/06/20 0842  . traZODone (DESYREL) tablet 50 mg  50 mg Oral QHS PRN Acheampong, Warnell Bureau, MD   50 mg at 10/06/20 2358  . vancomycin (VANCOREADY) IVPB 1000 mg/200 mL  1,000 mg Intravenous Q48H Wieting, Richard, MD 200 mL/hr at 10/07/20 0915 1,000 mg at 10/07/20 0915    VITAL SIGNS: BP (!) 116/56   Pulse 91   Temp 98 F (36.7 C) (Axillary)   Resp 18   Ht $R'5\' 6"'dK$  (1.676 m)   Wt 83.1 kg   SpO2 97%   BMI 29.57 kg/m  Filed Weights   10/06/20 0504 10/06/20 1305 10/07/20 0316  Weight: 79.9 kg 84.2 kg 83.1 kg    Estimated body mass index is 29.57 kg/m as calculated from the following:   Height as of this encounter: $RemoveBeforeD'5\' 6"'LjkeBjxgXOLayf$  (1.676 m).   Weight as of this encounter: 83.1 kg.  LABS: CBC:    Component Value Date/Time   WBC 4.5 10/07/2020 0435   HGB 8.1 (L) 10/07/2020 0435   HGB 11.9 10/30/2014 1650   HCT 25.1 (L) 10/07/2020 0435   HCT 36.6 10/30/2014 1650   PLT 157 10/07/2020 0435   PLT 253 10/30/2014 1650   Comprehensive  Metabolic Panel:    Component Value Date/Time   NA 139 10/07/2020 0435   NA 141 01/05/2017 0902   NA 139 04/25/2013 0526   K 4.0 10/07/2020 0435   K 3.8 04/25/2013 0526   BUN 41 (H) 10/07/2020 0435   BUN 20 01/05/2017 0902   BUN 14 04/25/2013 0526   CREATININE 2.54 (H) 10/07/2020 0435   CREATININE 0.79 04/25/2013 0526   CREATININE 0.76 02/08/2011 0900   ALBUMIN 2.5 (L) 10/06/2020 1358   ALBUMIN 3.8 05/30/2016 1137   ALBUMIN 2.8 (L) 04/24/2013 0749     Review of Systems  Musculoskeletal: Positive for joint swelling.  Neurological: Positive for weakness.  Unless otherwise noted, a complete review of systems is negative.  Physical Exam General: NAD, frail chronically-ill appearing Cardiovascular: regular rate and rhythm Pulmonary: diminished bilaterally  Abdomen: soft, nontender, + bowel sounds Extremities: no edema, no joint deformities Skin: Leg wound reported (not observed) scattered bruising Neurological: AAOx3, mood appropriate, follows commands  Prognosis: Guarded  Discharge Planning:  To Be Determined  Recommendations: . DNR/DNI-as confirmed by patient and family . Continue with current plan of care, patient and family expressed goals to continue to treat the treatable with watchful waiting.  They remain hopeful for some improvement/stability however is also preparing for the worst in the event this does not happen.  If Ms. Mcmanamon is unable to make a meaningful recovery she  would then wish to focus on her comfort, would not want life prolonging measures, however is very much hopeful for the ability to continue to thrive with a goal of outpatient rehabilitation at SNF if recommended. Marland Kitchen PMT will continue to support and follow as needed. Please call team line with urgent needs.   Palliative Performance Scale:                Patient and family expressed understanding and was in agreement with this plan.   Thank you for allowing the Palliative Medicine Team to assist  in the care of this patient. Please utilize secure chat with additional questions, if there is no response within 30 minutes please call the above phone number.   Time In: 1040 Time Out: 1130 Time Total: 50 min.   Visit consisted of counseling and education dealing with the complex and emotionally intense issues of symptom management and palliative care in the setting of serious and potentially life-threatening illness.Greater than 50%  of this time was spent counseling and coordinating care related to the above assessment and plan.  Signed by:  Alda Lea, AGPCNP-BC Palliative Medicine Team  Phone: [phone] Pager: [phone] Amion: [facility] Team providers are available by phone from 7am to 7pm daily and can be reached through the team cell phone.  Should this patient require assistance outside of these hours, please call the patient's attending physician.

## 2020-10-07 NOTE — Progress Notes (Signed)
PT Cancellation Note  Patient Details Name: [name] MRN: [id number] DOB: December 02, 1940   Cancelled Treatment:    Reason Eval/Treat Not Completed: Other (comment).  Pt transferred to CCU 5/17 and then PT order cancelled.  Please re-consult PT when pt is medically appropriate to participate in PT.  Hendricks Limes, PT 10/07/20, 8:24 AM

## 2020-10-07 NOTE — Progress Notes (Addendum)
[facility]  - [place] [facility]   PATIENT NAME: [name]    MR#:  [id number]  DATE OF BIRTH:  February 04, 1941  SUBJECTIVE:  complains of right shoulder pain. Seen by orthopedic earlier maneuvered the shoulder. No chest pain. Daughter-in-law at bedside. Overall weak  REVIEW OF SYSTEMS:   Review of Systems  Constitutional: Positive for malaise/fatigue. Negative for chills, fever and weight loss.  HENT: Negative for ear discharge, ear pain and nosebleeds.   Eyes: Negative for blurred vision, pain and discharge.  Respiratory: Negative for sputum production, shortness of breath, wheezing and stridor.   Cardiovascular: Negative for chest pain, palpitations, orthopnea and PND.  Gastrointestinal: Negative for abdominal pain, diarrhea, nausea and vomiting.  Genitourinary: Negative for frequency and urgency.  Musculoskeletal: Positive for joint pain. Negative for back pain.  Neurological: Positive for weakness. Negative for sensory change, speech change and focal weakness.  Psychiatric/Behavioral: Negative for depression and hallucinations. The patient is nervous/anxious.    Tolerating Diet:yes Tolerating PT: SNF  DRUG ALLERGIES:   Allergies  Allergen Reactions  . Penicillins Hives    Did it involve swelling of the face/tongue/throat, SOB, or low BP? NO Did it involve sudden or severe rash/hives, skin peeling, or any reaction on the inside of your mouth or nose? Yes. Pt reports severe hives (blistering) from neck to her stomach Did you need to seek medical attention at a hospital or doctor's office? Unknown When did it last happen?>36 years ago If all above answers are "NO", may proceed with cephalosporin use.  . Amlodipine Rash  . Clarithromycin Rash  . Minocycline Other (See Comments)    Onset 04/10/2001.  / unknown reaction  . Propoxyphene Hives    Onset 04/10/2001.   . Ace Inhibitors Cough  . Norvasc [Amlodipine Besylate] Hives  . Vicodin  [Hydrocodone-Acetaminophen] Hives    Hives occur after 5 doses   . Betadine [Povidone Iodine] Rash and Other (See Comments)    blisters  . Iodine Rash  . Sulfa Antibiotics Diarrhea and Nausea Only    VITALS:  Blood pressure (!) 116/56, pulse 91, temperature 98 F (36.7 C), temperature source Axillary, resp. rate 18, height 5\' 6"  (1.676 m), weight 83.1 kg, SpO2 97 %.  PHYSICAL EXAMINATION:   Physical Exam  GENERAL:  80 y.o.-year-old patient lying in the bed with no acute distress. Obese HEENT: Head atraumatic, normocephalic. Oropharynx and nasopharynx clear.   LUNGS: Normal breath sounds bilaterally, no wheezing, rales, rhonchi. No use of accessory muscles of respiration.  CARDIOVASCULAR: S1, S2 normal. No murmurs, rubs, or gallops.  ABDOMEN: Soft, nontender, nondistended. Bowel sounds present. No organomegaly or mass.  EXTREMITIES:   PSYCHIATRIC:  patient is alert and oriented x 3.  SKIN: as above  LABORATORY PANEL:  CBC Recent Labs  Lab 10/07/20 0435  WBC 4.5  HGB 8.1*  HCT 25.1*  PLT 157    Chemistries  Recent Labs  Lab 10/06/20 1358 10/06/20 1803 10/07/20 0435  NA 136  --  139  K 5.0  --  4.0  CL 100  --  105  CO2 25  --  25  GLUCOSE 119*  --  98  BUN 44*  --  41*  CREATININE 2.86*  --  2.54*  CALCIUM 8.8*  --  8.4*  MG  --    < > 3.1*  AST 25  --   --   ALT 23  --   --   ALKPHOS 69  --   --  BILITOT 0.9  --   --    < > = values in this interval not displayed.   Cardiac Enzymes No results for input(s): TROPONINI in the last 168 hours. RADIOLOGY:  DG Shoulder Right  Result Date: 10/06/2020 CLINICAL DATA:  RIGHT shoulder pain. EXAM: RIGHT SHOULDER - 2+ VIEW COMPARISON:  Chest x-ray on 09/30/2020 FINDINGS: There are mild degenerative changes in the shoulder, similar in appearance to the prior study. No acute fracture or subluxation. RIGHT lung apex is unremarkable. IMPRESSION: No evidence for acute  abnormality. Electronically Signed   By: Norva Pavlov M.D.   On: 10/06/2020 14:43   DG Chest Port 1 View  Result Date: 10/06/2020 CLINICAL DATA:  PICC line placement. EXAM: PORTABLE CHEST 1 VIEW COMPARISON:  Earlier film, same date. FINDINGS: The right PICC line is in good position with the tip in the distal SVC near the cavoatrial junction. Stable cardiac enlargement, pulmonary infiltrates and pleural effusions. IMPRESSION: Right PICC line in good position with the tip in the distal SVC near the cavoatrial junction. Electronically Signed   By: Rudie Meyer M.D.   On: 10/06/2020 17:28   DG Chest Port 1 View  Result Date: 10/06/2020 CLINICAL DATA:  Acute respiratory failure.  Hypoxia. EXAM: PORTABLE CHEST 1 VIEW COMPARISON:  09/28/2020 FINDINGS: Stable cardiomegaly. Patchy infiltrates are seen bilaterally, increased in mid lung zones compared to prior studies. Patient is rotated towards the LEFT. IMPRESSION: Increased pulmonary infiltrates.  Stable cardiomegaly. Electronically Signed   By: Norva Pavlov M.D.   On: 10/06/2020 14:44   ECHOCARDIOGRAM COMPLETE  Result Date: 10/07/2020    ECHOCARDIOGRAM REPORT   Patient Name:   [name] Date of Exam: 10/07/2020 Medical Rec #:  [id number]   Height:       66.0 in Accession #:    [id number]  Weight:       183.2 lb Date of Birth:  Oct 22, 1940   BSA:          1.927 m Patient Age:    30 years    BP:           115/58 mmHg Patient Gender: F           HR:           79 bpm. Exam Location:  [facility] Procedure: 2D Echo, Color Doppler and Cardiac Doppler Indications:     CHF-acute diastolic I50.31  History:         Patient has prior history of Echocardiogram examinations, most                  recent 03/13/2020. Risk Factors:Hypertension.  Sonographer:     Cristela Blue RDCS (AE) Referring Phys:  [phone] CHRISTOPHER END Diagnosing Phys: Debbe Odea MD  Sonographer Comments: No apical window. IMPRESSIONS  1. Left ventricular ejection fraction, by estimation, is 60 to 65%. The left ventricle has normal function. The left  ventricle has no regional wall motion abnormalities. There is mild left ventricular hypertrophy. Left ventricular diastolic parameters are indeterminate.  2. Right ventricular systolic function is normal. The right ventricular size is normal.  3. Left atrial size was moderately dilated.  4. The mitral valve is normal in structure. Trivial mitral valve regurgitation.  5. The aortic valve is tricuspid. Aortic valve regurgitation is mild to moderate. Mild aortic valve sclerosis is present, with no evidence of aortic valve stenosis.  6. The inferior vena cava is dilated in size with <50% respiratory variability, suggesting right atrial pressure  of 15 mmHg. FINDINGS  Left Ventricle: Left ventricular ejection fraction, by estimation, is 60 to 65%. The left ventricle has normal function. The left ventricle has no regional wall motion abnormalities. The left ventricular internal cavity size was normal in size. There is  mild left ventricular hypertrophy. Left ventricular diastolic parameters are indeterminate. Right Ventricle: The right ventricular size is normal. No increase in right ventricular wall thickness. Right ventricular systolic function is normal. Left Atrium: Left atrial size was moderately dilated. Right Atrium: Right atrial size was not well visualized. Pericardium: There is no evidence of pericardial effusion. Mitral Valve: The mitral valve is normal in structure. Trivial mitral valve regurgitation. Tricuspid Valve: The tricuspid valve is normal in structure. Tricuspid valve regurgitation is mild. Aortic Valve: The aortic valve is tricuspid. Aortic valve regurgitation is mild to moderate. Mild aortic valve sclerosis is present, with no evidence of aortic valve stenosis. Pulmonic Valve: The pulmonic valve was normal in structure. Pulmonic valve regurgitation is trivial. Aorta: The aortic root is normal in size and structure. Venous: The inferior vena cava is dilated in size with less than 50% respiratory  variability, suggesting right atrial pressure of 15 mmHg. IAS/Shunts: No atrial level shunt detected by color flow Doppler.  LEFT VENTRICLE PLAX 2D LVIDd:         4.53 cm LVIDs:         2.51 cm LV PW:         1.24 cm LV IVS:        1.49 cm LVOT diam:     2.00 cm LVOT Area:     3.14 cm  LEFT ATRIUM         Index LA diam:    4.40 cm 2.28 cm/m                        PULMONIC VALVE AORTA                 PV Vmax:        0.65 m/s Ao Root diam: 3.20 cm PV Peak grad:   1.7 mmHg                       RVOT Peak grad: 2 mmHg  TRICUSPID VALVE TR Peak grad:   34.3 mmHg TR Vmax:        293.00 cm/s  SHUNTS Systemic Diam: 2.00 cm Debbe Odea MD Electronically signed by Debbe Odea MD Signature Date/Time: 10/07/2020/1:49:04 PM    Final    [place] EKG SITE RITE  Result Date: 10/06/2020 If Site Rite image not attached, placement could not be confirmed due to current cardiac rhythm.  ASSESSMENT AND PLAN:  Ann Hamler Couchis an 80 y.o.femalewith a past medical history significant forchronic respiratory failure on 1 to 2 L/min of oxygen at home,atrial fibrillation on chronic anticoagulation therapywith xarelto, history of HFpEF (LVEF of 55-60% in 02/2020), hypertension, recent admission for COVID 19 approximately 2 weeks ago. She apparently fell at home about 2 weeks ago and sustained a laceration injury to her shin with significant bleeding reported. She has since followed up with wound care clinic.  Patient was transferred to the ICU with possible ventricular arrhythmia.  Acute on chronic hypoxic respiratory failure secondary to acute diastolic heart failure with history of recent COVID 19 infection about two weeks ago on chronic oxygen at home -- diuresis as blood pressure and renal function permits, continue oxygen to maintain sats greater than  90%. -- PRN bronchodilators  Sepsis suspected due to left LE wound infection --tachycardia, elevated wbc, hypotension , LA 1.1 --cont broad spectrum abxs with vanc  +cefepime per ID Recommendation --now afebrile, BP stable and LA 1.1 --BC negative  Paroxysmal atrial fibrillation on Xarelto mild elevated troponin suspect demand ischemia episode of ? V-Tach -- seen by cardiology continue verapamil and flecainide -- holding eliquis for now due to acute blood loss from lower extremity wound  Acute blood loss anemia likely from lower extremity bleeding wound -- transfuse as needed to keep hemoglobin more than seven. -- Eliquis on hold  Left lower extremity wound infection secondary to injury -- follows at wound clinic -- follow-up wound RN recommendations -- currently on IV vancomycin and cefepime per ID recommendations -- may need MRI of the left lower extremity at a later date  Right shoulder pain worsened with fall  history of rotator cuff tear -- seen by orthopedic Dr. Allena KatzPatel--- recommend Voltaren gel and physical therapy  Anxiety  --continue fluoxetine  PT OT recommends rehab. Patient and family agreeable.  TOC for discharge planning      Procedures: Family communication :DIL at bedside Consults :ID, cardiology CODE STATUS: DNR DVT Prophylaxis : SCD Level of care: ICU Status is: Inpatient  Remains inpatient appropriate because:Inpatient level of care appropriate due to severity of illness   Dispo: The patient is from: Home              Anticipated d/c is to: SNF              Patient currently is not medically stable to d/c.   Difficult to place patient No        TOTAL TIME TAKING CARE OF THIS PATIENT: 30 minutes.  >50% time spent on counselling and coordination of care  Note: This dictation was prepared with Dragon dictation along with smaller phrase technology. Any transcriptional errors that result from this process are unintentional.  Enedina FinnerSona Kelten Enochs M.D    [facility]   CC: Primary care physician; Danella PentonMiller, Mark F, MDPatient ID: [name], female   DOB: January 08, 1941, 80 y.o.   MRN: [id number]

## 2020-10-07 NOTE — TOC Progression Note (Signed)
Transition of Care [facility]) - Progression Note    Patient Details  Name: [name] MRN: [id number] Date of Birth: January 19, 1941  Transition of Care [facility]) CM/SW Contact  Hetty Ely, RN Phone Number: 10/07/2020, 11:14 AM  Clinical Narrative: TOC unable to assess due to patient being in pain. PTOT unable to assess due to medical condition. Palliative consult for goals of care is pending. TOC will continue to track and assist as needed following Palliative evaluation.           Expected Discharge Plan and Services                                                 Social Determinants of Health (SDOH) Interventions    Readmission Risk Interventions Readmission Risk Prevention Plan 06/17/2019  Transportation Screening Complete  HRI or Home Care Consult Complete  Palliative Care Screening Not Complete  Medication Review (RN Care Manager) Complete  Some recent data might be hidden

## 2020-10-07 NOTE — Evaluation (Addendum)
Physical Therapy Evaluation Patient Details Name: [name] MRN: [id number] DOB: 1940-09-26 Today's Date: 10/07/2020   History of Present Illness  Pt is a 80 y.o. female presenting to hospital 5/16 with SOB/hypoxia and wound to L LE.  Per chart pt had injury to L leg d/t fall last week.  Pt admitted with acute symptomatic anemia, acute CHF, AKI, L LE wound, recent COVID-19, h/o frequent falls at home.  Of note, pt with MRI R shoulder in April showing: 1. Complete full-thickness retracted tears of the supraspinatus and  subscapularis tendons with associated muscle atrophy.  2. Tendinosis and high-grade partial-thickness tearing of the  infraspinatus tendon.  3. Dislocation of the long head biceps tendon into the anterior  aspect of the glenohumeral joint but remains intact.  Pt reports falling since this imaging was taken (hitting shoulder on chair) and having more pain now.  Imaging R shoulder 5/17 negative for fx/subluxation/acute abnormality.  Pt transferred to CCU 5/17 secondary episode of ventricular tachycardia, hypotension (SBP in 70's), and increasing O2 requirements.  PMH includes a-fib, CHF, 1 L home O2 at night, COVID (+) 09/06/20, RLS, anxiety, htn, PNA, anemia of chronic disease, hyperkalemia, abdominal sx, arm sx, R knee sx, nose sx, R shoulder sx.  Clinical Impression  New PT consult received after pt transferred to ICU so PT re-evaluation performed.  Prior to hospital admission, pt was ambulating shorter distances with RW and assistance; lives alone but has had 24/7 assist from paid caregivers since recent hospital discharge.  Currently pt is 2 assist with bed mobility (2nd assist provided d/t reports of significant R shoulder pain earlier today and trying to prevent increasing R shoulder pain with mobility).  Sitting balance close SBA to occasional min assist d/t posterior lean (x10 minutes sitting edge of bed).  Pt initially requiring assist to elevate L LE in sitting d/t L lower leg pain  but pt eventually able to lower L LE down and tolerate some sitting LE ex's.  Pt's O2 sats 92% or greater on 12 L O2 via nasal cannula during sessions activities; HR 92-110 bpm during sessions activities.  Minimal pain noted R shoulder and L lower leg beginning and end of session at rest.  Pt would benefit from skilled PT to address noted impairments and functional limitations (see below for any additional details).  Upon hospital discharge, pt would benefit from SNF.  Plan of care reviewed and remains appropriate.    Follow Up Recommendations SNF    Equipment Recommendations  None recommended by PT (pt has RW, BSC, and manual w/c at home already)    Recommendations for Other Services OT consult     Precautions / Restrictions Precautions Precautions: Fall Precaution Comments: L LE wound Restrictions Weight Bearing Restrictions: Yes RUE Weight Bearing: Weight bearing as tolerated Other Position/Activity Restrictions: Per secure message with orthopedic MD Signa Kell pt WBAT R UE.  (RUE rotator cuff injury/pain limits weight bearing though)      Mobility  Bed Mobility Overal bed mobility: Needs Assistance Bed Mobility: Supine to Sit;Sit to Supine     Supine to sit: +2 for physical assistance;HOB elevated Sit to supine: +2 for physical assistance;HOB elevated   General bed mobility comments: assist for trunk and B LE's using bed sheet (increased assist provided d/t R shoulder pain concerns)    Transfers                 General transfer comment: Deferred d/t L LE pain  Ambulation/Gait  Stairs            Wheelchair Mobility    Modified Rankin (Stroke Patients Only)       Balance Overall balance assessment: Needs assistance Sitting-balance support: Single extremity supported;Feet supported Sitting balance-Leahy Scale: Fair Sitting balance - Comments: steady static sitting; vc's required intermittently for upright posture                                      Pertinent Vitals/Pain Pain Assessment: Faces Faces Pain Scale: Hurts a little bit (2/10 at rest; 4-6/10 with movement) Pain Location: R shoulder; L lower leg Pain Descriptors / Indicators: Sore Pain Intervention(s): Limited activity within patient's tolerance;Monitored during session;Repositioned;Premedicated before session    Home Living Family/patient expects to be discharged to:: Private residence Living Arrangements: Alone Available Help at Discharge: Personal care attendant;Available 24 hours/day Type of Home: House Home Access: Ramped entrance     Home Layout: One level Home Equipment: Walker - 4 wheels;Walker - 2 wheels;Shower seat;Bedside commode;Grab bars - toilet;Cane - single point;Walker - standard;Wheelchair - Building surveyor      Prior Function Level of Independence: Needs assistance   Gait / Transfers Assistance Needed: pt reports recently walking short distances with RW and assist; h/o falls  ADL's / Homemaking Assistance Needed: requiring assist with ADL's d/t R rotator cuff injury        Hand Dominance        Extremity/Trunk Assessment   Upper Extremity Assessment Upper Extremity Assessment: Defer to OT evaluation RUE Deficits / Details: R shoulder pain with R UE movement    Lower Extremity Assessment Lower Extremity Assessment: Generalized weakness (L DF at least 2+/5 (limited d/t L LE pain with DF AROM))    Cervical / Trunk Assessment Cervical / Trunk Assessment: Other exceptions Cervical / Trunk Exceptions: forward head/shoulders  Communication   Communication: No difficulties  Cognition Arousal/Alertness: Awake/alert Behavior During Therapy:  (pt giggling intermittently during session) Overall Cognitive Status: Impaired/Different from baseline Area of Impairment: Orientation                 Orientation Level: Time;Situation;Disoriented to (pt reporting it was February 2022; decreased  recall of situation noted; generalized confusion noted)                    General Comments General comments (skin integrity, edema, etc.): L LE dressing in place.  Nursing cleared pt for participation in physical therapy.  Pt agreeable to PT session.  Pt's family and friend present beginning of session but left beginning of session.    Exercises General Exercises - Lower Extremity Ankle Circles/Pumps: AROM;Strengthening;Both;10 reps;Seated Long Arc Quad: AROM;Strengthening;Both;10 reps;Seated Hip Flexion/Marching: AROM;Strengthening;Both;10 reps;Seated   Assessment/Plan    PT Assessment Patient needs continued PT services  PT Problem List Decreased strength;Decreased activity tolerance;Decreased balance;Decreased mobility;Decreased knowledge of use of DME;Cardiopulmonary status limiting activity;Decreased knowledge of precautions;Decreased skin integrity;Pain       PT Treatment Interventions DME instruction;Gait training;Functional mobility training;Therapeutic activities;Therapeutic exercise;Balance training;Patient/family education;Cognitive remediation    PT Goals (Current goals can be found in the Care Plan section)  Acute Rehab PT Goals Patient Stated Goal: to go home PT Goal Formulation: With patient Time For Goal Achievement: 10/21/20 Potential to Achieve Goals: Fair    Frequency Min 2X/week   Barriers to discharge Decreased caregiver support Level of assist required    Co-evaluation  AM-PAC PT "6 Clicks" Mobility  Outcome Measure Help needed turning from your back to your side while in a flat bed without using bedrails?: A Little Help needed moving from lying on your back to sitting on the side of a flat bed without using bedrails?: A Lot Help needed moving to and from a bed to a chair (including a wheelchair)?: Total Help needed standing up from a chair using your arms (e.g., wheelchair or bedside chair)?: Total Help needed to walk in  hospital room?: Total Help needed climbing 3-5 steps with a railing? : Total 6 Click Score: 9    End of Session Equipment Utilized During Treatment: Oxygen (12 L O2 via HFNC) Activity Tolerance: Patient tolerated treatment well Patient left: in bed;with call bell/phone within reach;with bed alarm set;Other (comment) (B heels floating via pillow support) Nurse Communication: Mobility status;Precautions;Other (comment) (pt's bed wet (needing clean up/linen change)) PT Visit Diagnosis: Other abnormalities of gait and mobility (R26.89);Muscle weakness (generalized) (M62.81);History of falling (Z91.81);Repeated falls (R29.6);Pain Pain - Right/Left: Right Pain - part of body: Shoulder    Time: 4332-9518 PT Time Calculation (min) (ACUTE ONLY): 36 min   Charges:   PT Evaluation $PT Re-evaluation: 1 Re-eval PT Treatments $Therapeutic Exercise: 8-22 mins       Hendricks Limes, PT 10/07/20, 3:13 PM

## 2020-10-07 NOTE — Consult Note (Signed)
ORTHOPAEDIC CONSULTATION  REQUESTING PHYSICIAN: Enedina Finner, MD  Chief Complaint:   R shoulder pain  History of Present Illness: Patient awake, alert, pleasant, and appropriately responsive this morning.  Patient's family also at the bedside.  LENNOX LEIKAM is a 80 y.o. female with a complex medical history including COPD on baseline oxygen at home, A. fib on Xarelto, CHF, history of CKD, and recent COVID admission 2 weeks ago who presented to the hospital 2 days ago after home health nurses found that she was hypoxic.  She was instructed to present to the ED and found to have acute kidney injury and anemia.  Additionally, she had a last week where she had a laceration to her left anterior tibia.  She has been on Bactrim for this.  During this admission, she was found to be hypotensive with tachycardia and increasing oxygen requirements.  She was transferred to the stepdown unit.  Of note, I evaluated the patient as an outpatient on 09/24/2020 for right shoulder pain after MRI showed massive rotator cuff tears the subscapularis and supraspinatus.  She was given a subacromial corticosteroid injection and referred to physical therapy at that visit.  She states she had good relief of right shoulder pain until this most recent fall.  Yesterday, she had significant sensitivity to touch and difficulty raising her arm.  Past Medical History:  Diagnosis Date  . A-fib (HCC) 2012, 2014  . Anxiety   . Arrhythmia    A-fib  . Bursitis    hips  . Chronic diastolic (congestive) heart failure (HCC)   . Concussion   . Concussion July, 28, 2014  . GERD (gastroesophageal reflux disease)   . Hypertension   . Palpitations   . Pneumonia 2012   Past Surgical History:  Procedure Laterality Date  . ABDOMINAL SURGERY  2008   abdominal muscle mesh insert  . Arm surgery  2015   Pin implanted   . CATARACT EXTRACTION Right August 30, 2013  . CATARACT  EXTRACTION Left August 09, 2013  . COLONOSCOPY    . ESOPHAGOGASTRODUODENOSCOPY    . HERNIA REPAIR Left 1970  . HERNIA REPAIR  2015   Dr. Malissa Hippo  . KNEE SURGERY Right 1980  . NOSE SURGERY    . SHOULDER SURGERY Right 2000  . TOTAL ABDOMINAL HYSTERECTOMY  1995   Social History   Socioeconomic History  . Marital status: Widowed    Spouse name: Not on file  . Number of children: Not on file  . Years of education: Tax adviser  . Highest education level: Not on file  Occupational History  . Not on file  Tobacco Use  . Smoking status: Never Smoker  . Smokeless tobacco: Former Clinical biochemist  . Vaping Use: Never used  Substance and Sexual Activity  . Alcohol use: No  . Drug use: No  . Sexual activity: Not on file  Other Topics Concern  . Not on file  Social History Narrative   Lives at home with cat.   Caffeine use: Drinks 3-4 sodas per day   Social Determinants of Health   Financial Resource Strain: Not on file  Food Insecurity: Not on file  Transportation Needs: Not on file  Physical Activity: Not on file  Stress: Not on file  Social Connections: Not on file   Family History  Adopted: Yes  Problem Relation Age of Onset  . Dementia Neg Hx        ADOPTED   Allergies  Allergen Reactions  .  Penicillins Hives    Did it involve swelling of the face/tongue/throat, SOB, or low BP? NO Did it involve sudden or severe rash/hives, skin peeling, or any reaction on the inside of your mouth or nose? Yes. Pt reports severe hives (blistering) from neck to her stomach Did you need to seek medical attention at a hospital or doctor's office? Unknown When did it last happen?>36 years ago If all above answers are "NO", may proceed with cephalosporin use.  . Amlodipine Rash  . Clarithromycin Rash  . Minocycline Other (See Comments)    Onset 04/10/2001.  / unknown reaction  . Propoxyphene Hives    Onset 04/10/2001.   . Ace Inhibitors Cough  . Norvasc [Amlodipine Besylate] Hives   . Vicodin [Hydrocodone-Acetaminophen] Hives    Hives occur after 5 doses   . Betadine [Povidone Iodine] Rash and Other (See Comments)    blisters  . Iodine Rash  . Sulfa Antibiotics Diarrhea and Nausea Only   Prior to Admission medications   Medication Sig Start Date End Date Taking? Authorizing Provider  acetaminophen (TYLENOL) 500 MG tablet Take 2 tablets (1,000 mg total) by mouth 3 (three) times daily as needed for mild pain, moderate pain or headache. 09/15/20  Yes Esaw Grandchild A, DO  albuterol (VENTOLIN HFA) 108 (90 Base) MCG/ACT inhaler Inhale 2 puffs into the lungs every 6 (six) hours. 09/15/20  Yes Pennie Banter, DO  ALPRAZolam Prudy Feeler) 0.25 MG tablet Take 0.25 mg by mouth 3 (three) times daily as needed for anxiety.    Yes [provider]  Ascorbic Acid (VITAMIN C) 1000 MG tablet Take 1,000 mg by mouth daily.   Yes [provider]  benzocaine (ORAJEL) 10 % mucosal gel Use as directed in the mouth or throat 4 (four) times daily as needed for mouth pain. 09/15/20  Yes Pennie Banter, DO  butalbital-acetaminophen-caffeine (FIORICET) 50-325-40 MG tablet Take 1 tablet by mouth every 4 (four) hours as needed for headache.   Yes [provider]  carbamazepine (TEGRETOL XR) 100 MG 12 hr tablet Take 100 mg by mouth 2 (two) times daily as needed (RLS symptoms).   Yes [provider]  celecoxib (CELEBREX) 100 MG capsule Take 100 mg by mouth 2 (two) times daily.   Yes [provider]  cetirizine (ZYRTEC) 10 MG tablet Take 10 mg by mouth daily.   Yes [provider]  colchicine 0.6 MG tablet Take 1 tablet (0.6 mg total) by mouth daily as needed (gout flare). 06/19/19  Yes Enedina Finner, MD  esomeprazole (NEXIUM) 20 MG capsule Take 20 mg by mouth daily before breakfast.    Yes [provider]  estradiol (ESTRACE) 1 MG tablet Take 1.5 mg by mouth daily.    Yes [provider]  famotidine (PEPCID) 20 MG tablet Take 20 mg by  mouth daily.   Yes [provider]  flecainide (TAMBOCOR) 50 MG tablet Take 1 tablet (50 mg total) by mouth every 12 (twelve) hours. 09/15/20  Yes Esaw Grandchild A, DO  FLUoxetine (PROZAC) 20 MG capsule Take 20 mg by mouth daily.   Yes [provider]  fluticasone (FLONASE) 50 MCG/ACT nasal spray Place 2 sprays into both nostrils 2 (two) times daily.   Yes [provider]  Fluticasone Furoate 100 MCG/ACT AEPB Take 1 puff by mouth daily. 09/23/20  Yes [provider]  furosemide (LASIX) 20 MG tablet Take 1 tablet (20 mg total) by mouth 2 (two) times daily. 09/15/20  Yes Denton Lank,  Kelly A, DO  guaiFENesin-dextromethorphan (ROBITUSSIN DM) 100-10 MG/5ML syrup Take 10 mLs by mouth every 4 (four) hours as needed for cough. 09/15/20  Yes Pennie Banter, DO  hydrocortisone (ANUSOL-HC) 2.5 % rectal cream Place rectally 2 (two) times daily. 09/15/20  Yes Esaw Grandchild A, DO  magnesium gluconate (MAGONATE) 500 MG tablet Take 1 tablet (500 mg total) by mouth 2 (two) times daily. 09/15/20  Yes Esaw Grandchild A, DO  metoprolol tartrate (LOPRESSOR) 25 MG tablet Take 0.5 tablets (12.5 mg total) by mouth daily as needed (fast HR (over 110 beat/min)). 09/15/20  Yes Esaw Grandchild A, DO  montelukast (SINGULAIR) 10 MG tablet Take 1 tablet (10 mg total) by mouth at bedtime. 09/15/20  Yes Pennie Banter, DO  Multiple Vitamin (MULTIVITAMIN WITH MINERALS) TABS tablet Take 1 tablet by mouth daily with supper.   Yes [provider]  multivitamin-lutein (OCUVITE-LUTEIN) CAPS capsule Take 1 capsule by mouth daily. 09/16/20  Yes Esaw Grandchild A, DO  ondansetron (ZOFRAN) 4 MG tablet Take 2-4 mg by mouth every 6 (six) hours as needed for nausea or vomiting.   Yes [provider]  oxybutynin (DITROPAN) 5 MG tablet Take 5 mg by mouth every evening.  10/01/14  Yes [provider]  pramipexole (MIRAPEX) 0.5 MG tablet Take 1 tablet (0.5 mg total) by mouth 4 (four) times  daily. 09/15/20  Yes Esaw Grandchild A, DO  sodium chloride (OCEAN) 0.65 % SOLN nasal spray Place 1 spray into both nostrils as needed for congestion. 09/15/20  Yes Esaw Grandchild A, DO  sucralfate (CARAFATE) 1 g tablet Take 1 g by mouth 3 (three) times daily. 05/07/19  Yes [provider]  sulfamethoxazole-trimethoprim (BACTRIM) 400-80 MG tablet Take 1 tablet by mouth 2 (two) times daily. 09/28/20  Yes Triplett, Cari B, FNP  SUMAtriptan (IMITREX) 50 MG tablet Take 50 mg by mouth daily as needed for migraine.   Yes [provider]  traZODone (DESYREL) 50 MG tablet Take 50 mg by mouth at bedtime.   Yes [provider]  verapamil (VERELAN PM) 180 MG 24 hr capsule Take 180 mg by mouth in the morning and at bedtime.    Yes [provider]  vitamin B-12 (CYANOCOBALAMIN) 1000 MCG tablet Take 1,000 mcg by mouth at bedtime.    Yes [provider]  acyclovir ointment (ZOVIRAX) 5 % Apply topically every 3 (three) hours. Patient not taking: No sig reported 09/15/20   Esaw Grandchild A, DO  ELIQUIS 5 MG TABS tablet TAKE 1 TABLET(5 MG) BY MOUTH TWICE DAILY Patient not taking: No sig reported 07/29/20   Antonieta Iba, MD  fluticasone (FLOVENT HFA) 220 MCG/ACT inhaler Inhale 1 puff into the lungs 2 (two) times daily. Patient not taking: No sig reported 09/15/20   Esaw Grandchild A, DO  magic mouthwash SOLN Take 15 mLs by mouth 3 (three) times daily. Patient not taking: No sig reported 09/15/20   Pennie Banter, DO  predniSONE (DELTASONE) 50 MG tablet Take 1 tablet (50 mg) by mouth once daily with breakfast Patient not taking: No sig reported 09/16/20   Pennie Banter, DO  tiZANidine (ZANAFLEX) 2 MG tablet Take 1 tablet (2 mg total) by mouth 3 (three) times daily as needed for muscle spasms. Patient not taking: No sig reported 09/15/20   Esaw Grandchild A, DO  traZODone (DESYREL) 100 MG tablet Take 0.5 tablets (50 mg total) by mouth at bedtime as needed for  sleep. Patient not taking: No sig  reported 03/19/20   Lurene Shadow, MD   Recent Labs    11-03-2020 1145 11-03-20 1806 2020/11/03 2210 10/06/20 0357 10/06/20 0823 10/06/20 1244 10/06/20 1358 10/07/20 0435  WBC 6.9  --   --   --   --   --  5.4 4.5  HGB 7.4* 7.5* 8.2*  --  7.8* 7.5* 7.9* 8.1*  HCT 25.0* 24.9* 26.3*  --  24.5*  --  25.0* 25.1*  PLT 208  --   --   --   --   --  181 157  K 4.9  --   --    < >  --   --  5.0 4.0  CL 103  --   --    < >  --   --  100 105  CO2 25  --   --    < >  --   --  25 25  BUN 43*  --   --    < >  --   --  44* 41*  CREATININE 2.67*  --   --    < >  --   --  2.86* 2.54*  GLUCOSE 125*  --   --    < >  --   --  119* 98  CALCIUM 8.9  --   --    < >  --   --  8.8* 8.4*  INR  --   --   --   --   --   --   --  1.2   < > = values in this interval not displayed.   DG Shoulder Right  Result Date: 10/06/2020 CLINICAL DATA:  RIGHT shoulder pain. EXAM: RIGHT SHOULDER - 2+ VIEW COMPARISON:  Chest x-ray on Nov 03, 2020 FINDINGS: There are mild degenerative changes in the shoulder, similar in appearance to the prior study. No acute fracture or subluxation. RIGHT lung apex is unremarkable. IMPRESSION: No evidence for acute  abnormality. Electronically Signed   By: Norva Pavlov M.D.   On: 10/06/2020 14:43   DG Tibia/Fibula Left  Result Date: 11/03/20 CLINICAL DATA:  Wound infection. EXAM: LEFT TIBIA AND FIBULA - 2 VIEW COMPARISON:  06/11/2019 FINDINGS: The knee and ankle joints are maintained. There is a bandage over the midportion of the left leg. There appears to be gas in the soft tissues which may be related to an open wound. I do not see any plain film findings for osteomyelitis. IMPRESSION: 1. Gas in the soft tissues may be related to an open wound. 2. No plain film findings for osteomyelitis. Electronically Signed   By: Rudie Meyer M.D.   On: 11-03-20 12:54   DG Chest Port 1 View  Result Date: 10/06/2020 CLINICAL DATA:  PICC line placement. EXAM: PORTABLE  CHEST 1 VIEW COMPARISON:  Earlier film, same date. FINDINGS: The right PICC line is in good position with the tip in the distal SVC near the cavoatrial junction. Stable cardiac enlargement, pulmonary infiltrates and pleural effusions. IMPRESSION: Right PICC line in good position with the tip in the distal SVC near the cavoatrial junction. Electronically Signed   By: Rudie Meyer M.D.   On: 10/06/2020 17:28   DG Chest Port 1 View  Result Date: 10/06/2020 CLINICAL DATA:  Acute respiratory failure.  Hypoxia. EXAM: PORTABLE CHEST 1 VIEW COMPARISON:  11/03/20 FINDINGS: Stable cardiomegaly. Patchy infiltrates are seen bilaterally, increased in mid lung zones compared to prior studies. Patient is rotated towards the LEFT. IMPRESSION: Increased pulmonary  infiltrates.  Stable cardiomegaly. Electronically Signed   By: Norva PavlovElizabeth  Brown M.D.   On: 10/06/2020 14:44   DG Chest Portable 1 View  Result Date: 10/06/2020 CLINICAL DATA:  Shortness of breath, low O2 sat. EXAM: PORTABLE CHEST 1 VIEW COMPARISON:  09/14/2020 and CT chest 03/14/2020. FINDINGS: Trachea is midline. Heart is enlarged. There is patchy bilateral airspace opacification with slight coarsening and bronchiectasis. Blunting of the left costophrenic angle. IMPRESSION: 1. Patchy bilateral airspace opacification is likely due to pneumonia. Difficult to exclude edema. 2. Findings appear to be superimposed on a component of chronic interstitial lung disease. 3. Blunting of the left costophrenic angle may be due to pleural fluid. Electronically Signed   By: Leanna BattlesMelinda  Blietz M.D.   On: 10/19/2020 12:54   US EKG SITE RITE  Result Date: 10/06/2020 If Site Rite image not attached, placement could not be confirmed due to current cardiac rhythm.    Positive ROS: All other systems have been reviewed and were otherwise negative with the exception of those mentioned in the HPI and as above.  Physical Exam: BP (!) 126/95   Pulse 81   Temp 98.3 F (36.8 C)  (Oral)   Resp 20   Ht 5\' 6"  (1.676 m)   Wt 83.1 kg   SpO2 95%   BMI 29.57 kg/m  General:  Alert, no acute distress Psychiatric:  Patient is competent for consent with normal mood and affect   Cardiovascular:  No pedal edema, regular rate and rhythm Respiratory:  No wheezing, non-labored breathing GI:  Abdomen is soft and non-tender Skin:  No lesions in the area of chief complaint, no erythema Neurologic:  Sensation intact distally, CN grossly intact Lymphatic:  No axillary or cervical lymphadenopathy  Orthopedic Exam:  RUE: +ain/pin/u motor SILT r/u/m/ax +rad pulse RoM: Able to lift arm actively to approximately 90 degrees (per patient and family, this is improvement from yesterday) and able to externally rotate approximately 45 degrees. Strength testing: Weakness to supraspinatus and infraspinatus as well as subscapularis testing.   X-rays:  As above: No fractures or dislocations on right shoulder radiographs obtained on 10/06/2020.  Prior MRI on 09/01/20: IMPRESSION: 1. Complete full-thickness retracted tears of the supraspinatus and subscapularis tendons with associated muscle atrophy. 2. Tendinosis and high-grade partial-thickness tearing of the infraspinatus tendon. 3. Dislocation of the long head biceps tendon into the anterior aspect of the glenohumeral joint but remains intact. 4. Mild AC and glenohumeral osteoarthritis.    Assessment/Plan: [id number] year old female with multiple medical comorbidities as outlined above and in other notes, also presenting with increased right shoulder pain after a fall. 1.  I discussed the radiographic findings as well as the various treatment options with the patient and her family at the bedside today.  We discussed that given her recent corticosteroid injection approximately 2 weeks ago, I would not recommend repeat corticosteroid injection for least another 2.5 months.  She did get good relief from prior injection until this most recent  fall.  Surgical intervention would consist of reverse shoulder arthroplasty, but she has multiple medical comorbidities and has had multiple admissions to the hospital over the past 6 months which would make her very high risk of complication for performing such a procedure.  We agreed to hold off on pursuing this until she was more medically stable.  2.  In the interim for improved function, I recommended a course of physical therapy to target deltoid strengthening exercises when the patient is more medically stable and at home after  discharge.  3.  For pain control, recommended up to 1000 mg Tylenol 3 times daily, Voltaren gel as that would have decreased systemic absorption given her CKD and AKI, lidocaine patches, as well as ice and/or heat.  This would be as long as none of these modalities were medically contraindicated.  4.  The patient can follow-up with me as an outpatient after discharge.  Please page with any questions.    Signa Kell   10/07/2020 8:53 AM

## 2020-10-08 ENCOUNTER — Ambulatory Visit: Payer: PRIVATE HEALTH INSURANCE | Admitting: Physician Assistant

## 2020-10-08 DIAGNOSIS — I5031 Acute diastolic (congestive) heart failure: Secondary | ICD-10-CM

## 2020-10-08 DIAGNOSIS — S81802A Unspecified open wound, left lower leg, initial encounter: Secondary | ICD-10-CM

## 2020-10-08 DIAGNOSIS — R52 Pain, unspecified: Secondary | ICD-10-CM

## 2020-10-08 DIAGNOSIS — J9621 Acute and chronic respiratory failure with hypoxia: Secondary | ICD-10-CM | POA: Diagnosis not present

## 2020-10-08 DIAGNOSIS — J9601 Acute respiratory failure with hypoxia: Secondary | ICD-10-CM | POA: Diagnosis not present

## 2020-10-08 DIAGNOSIS — R404 Transient alteration of awareness: Secondary | ICD-10-CM

## 2020-10-08 DIAGNOSIS — N179 Acute kidney failure, unspecified: Secondary | ICD-10-CM | POA: Diagnosis not present

## 2020-10-08 DIAGNOSIS — D649 Anemia, unspecified: Secondary | ICD-10-CM

## 2020-10-08 DIAGNOSIS — D62 Acute posthemorrhagic anemia: Secondary | ICD-10-CM | POA: Diagnosis not present

## 2020-10-08 DIAGNOSIS — I509 Heart failure, unspecified: Secondary | ICD-10-CM | POA: Diagnosis not present

## 2020-10-08 DIAGNOSIS — I472 Ventricular tachycardia: Secondary | ICD-10-CM | POA: Diagnosis not present

## 2020-10-08 LAB — BLOOD GAS, ARTERIAL
Acid-base deficit: 2.5 mmol/L — ABNORMAL HIGH (ref 0.0–2.0)
Bicarbonate: 21.2 mmol/L (ref 20.0–28.0)
FIO2: 0.9
O2 Saturation: 96.3 %
Patient temperature: 37
pCO2 arterial: 32 mmHg (ref 32.0–48.0)
pH, Arterial: 7.43 (ref 7.350–7.450)
pO2, Arterial: 82 mmHg — ABNORMAL LOW (ref 83.0–108.0)

## 2020-10-08 LAB — CBC
HCT: 29.9 % — ABNORMAL LOW (ref 36.0–46.0)
Hemoglobin: 9.6 g/dL — ABNORMAL LOW (ref 12.0–15.0)
MCH: 31.5 pg (ref 26.0–34.0)
MCHC: 32.1 g/dL (ref 30.0–36.0)
MCV: 98 fL (ref 80.0–100.0)
Platelets: 165 10*3/uL (ref 150–400)
RBC: 3.05 MIL/uL — ABNORMAL LOW (ref 3.87–5.11)
RDW: 18.4 % — ABNORMAL HIGH (ref 11.5–15.5)
WBC: 6.1 10*3/uL (ref 4.0–10.5)
nRBC: 0.5 % — ABNORMAL HIGH (ref 0.0–0.2)

## 2020-10-08 LAB — BASIC METABOLIC PANEL
Anion gap: 10 (ref 5–15)
BUN: 31 mg/dL — ABNORMAL HIGH (ref 8–23)
CO2: 24 mmol/L (ref 22–32)
Calcium: 8.6 mg/dL — ABNORMAL LOW (ref 8.9–10.3)
Chloride: 105 mmol/L (ref 98–111)
Creatinine, Ser: 1.78 mg/dL — ABNORMAL HIGH (ref 0.44–1.00)
GFR, Estimated: 29 mL/min — ABNORMAL LOW (ref >60)
Glucose, Bld: 103 mg/dL — ABNORMAL HIGH (ref 70–99)
Potassium: 4.2 mmol/L (ref 3.5–5.1)
Sodium: 139 mmol/L (ref 135–145)

## 2020-10-08 LAB — PHOSPHORUS: Phosphorus: 3.5 mg/dL (ref 2.5–4.6)

## 2020-10-08 LAB — PROCALCITONIN: Procalcitonin: 0.15 ng/mL

## 2020-10-08 LAB — MAGNESIUM: Magnesium: 2.4 mg/dL (ref 1.7–2.4)

## 2020-10-08 MED ORDER — SODIUM CHLORIDE 0.9 % IV BOLUS: 250.0000 mL | Freq: Once | INTRAVENOUS | Status: DC

## 2020-10-08 MED ORDER — POLYVINYL ALCOHOL 1.4 % OP SOLN
1.0000 [drp] | Freq: Four times a day (QID) | OPHTHALMIC | Status: DC | PRN
  Filled 2020-10-08: qty 15

## 2020-10-08 MED ORDER — AMIODARONE IV BOLUS ONLY 150 MG/100ML: 150.0000 mg | Freq: Once | INTRAVENOUS | Status: AC

## 2020-10-08 MED ORDER — HALOPERIDOL LACTATE 5 MG/ML IJ SOLN: 1.0000 mg | INTRAMUSCULAR | Status: DC

## 2020-10-08 MED ORDER — HALOPERIDOL LACTATE 5 MG/ML IJ SOLN
INTRAMUSCULAR | Status: AC
  Administered 2020-10-08: 1 mg via INTRAMUSCULAR
  Filled 2020-10-08: qty 1

## 2020-10-08 MED ORDER — HALOPERIDOL LACTATE 5 MG/ML IJ SOLN: 1.0000 mg | Freq: Four times a day (QID) | INTRAMUSCULAR | Status: DC | PRN

## 2020-10-08 MED ORDER — ACETAMINOPHEN 650 MG RE SUPP: 650.0000 mg | Freq: Four times a day (QID) | RECTAL | Status: DC | PRN

## 2020-10-08 MED ORDER — MORPHINE SULFATE (PF) 2 MG/ML IV SOLN
2.0000 mg | INTRAVENOUS | Status: DC | PRN
  Administered 2020-10-08: 2 mg via INTRAVENOUS
  Filled 2020-10-08: qty 1

## 2020-10-08 MED ORDER — LORAZEPAM 2 MG/ML IJ SOLN
1.0000 mg | INTRAMUSCULAR | Status: DC | PRN
  Administered 2020-10-08: 1 mg via INTRAVENOUS
  Filled 2020-10-08: qty 1

## 2020-10-08 MED ORDER — AMIODARONE IV BOLUS ONLY 150 MG/100ML
INTRAVENOUS | Status: AC
  Administered 2020-10-08: 150 mg
  Filled 2020-10-08: qty 100

## 2020-10-08 MED ORDER — MORPHINE 100MG IN NS 100ML (1MG/ML) PREMIX INFUSION
2.0000 mg/h | INTRAVENOUS | Status: DC
  Administered 2020-10-08: 2 mg/h via INTRAVENOUS
  Filled 2020-10-08: qty 100

## 2020-10-08 MED ORDER — GLYCOPYRROLATE 0.2 MG/ML IJ SOLN: 0.3000 mg | INTRAMUSCULAR | Status: DC | PRN

## 2020-10-08 MED ORDER — MORPHINE BOLUS VIA INFUSION
2.0000 mg | INTRAVENOUS | Status: DC | PRN
  Administered 2020-10-08: 3 mg via INTRAVENOUS
  Filled 2020-10-08: qty 4

## 2020-10-08 MED ORDER — DIPHENHYDRAMINE HCL 50 MG/ML IJ SOLN: 12.5000 mg | INTRAMUSCULAR | Status: DC | PRN

## 2020-10-08 MED ORDER — HALOPERIDOL LACTATE 5 MG/ML IJ SOLN
2.0000 mg | INTRAMUSCULAR | Status: AC
  Administered 2020-10-08: 2 mg via INTRAVENOUS
  Filled 2020-10-08: qty 1

## 2020-10-08 MED ORDER — METOPROLOL TARTRATE 5 MG/5ML IV SOLN: 2.5000 mg | Freq: Once | INTRAVENOUS | Status: DC

## 2020-10-08 MED ORDER — HALOPERIDOL 1 MG PO TABS
1.0000 mg | ORAL_TABLET | Freq: Four times a day (QID) | ORAL | Status: DC | PRN
  Filled 2020-10-08: qty 1

## 2020-10-08 MED ORDER — BIOTENE DRY MOUTH MT LIQD: 15.0000 mL | OROMUCOSAL | Status: DC | PRN

## 2020-10-11 LAB — CULTURE, BLOOD (ROUTINE X 2)
Culture: NO GROWTH
Culture: NO GROWTH

## 2020-10-11 LAB — AEROBIC CULTURE W GRAM STAIN (SUPERFICIAL SPECIMEN)

## 2020-10-13 ENCOUNTER — Ambulatory Visit: Payer: PRIVATE HEALTH INSURANCE | Admitting: Family

## 2020-10-15 ENCOUNTER — Ambulatory Visit: Payer: PRIVATE HEALTH INSURANCE | Admitting: Internal Medicine

## 2020-10-21 NOTE — Progress Notes (Signed)
PT Cancellation Note  Patient Details Name: [name] MRN: [id number] DOB: 21-Jan-1941   Cancelled Treatment:    Reason Eval/Treat Not Completed: Other (comment).  PT order cancelled by Palliative Care NP [place].  Per recent nursing note, pt transitioning to comfort measures only.  Hendricks Limes, PT 10/13/2020, 12:01 PM

## 2020-10-21 NOTE — Progress Notes (Addendum)
[facility] [place] NAME: Nishi Neiswonger    MR#:  [id number]  DATE OF BIRTH:  1941-04-30  SUBJECTIVE:  events from last night and this morning noted. Patient had significant amount of pain. Very uncomfortable. Issues with tachy arrhythmia. Palliative care met with patient's sister and son at bedside. They understand patient's poor prognosis and overall decline given multiple comorbidities with recent hard fall and leg infection. Patient currently is on morphine drip and appears comfortable.  REVIEW OF SYSTEMS:   Review of Systems  Unable to perform ROS: Mental status change    DRUG ALLERGIES:   Allergies  Allergen Reactions  . Penicillins Hives    Did it involve swelling of the face/tongue/throat, SOB, or low BP? NO Did it involve sudden or severe rash/hives, skin peeling, or any reaction on the inside of your mouth or nose? Yes. Pt reports severe hives (blistering) from neck to her stomach Did you need to seek medical attention at a hospital or doctor's office? Unknown When did it last happen?>36 years ago If all above answers are "NO", may proceed with cephalosporin use.  . Amlodipine Rash  . Clarithromycin Rash  . Minocycline Other (See Comments)    Onset 04/10/2001.  / unknown reaction  . Propoxyphene Hives    Onset 04/10/2001.   . Ace Inhibitors Cough  . Norvasc [Amlodipine Besylate] Hives  . Vicodin [Hydrocodone-Acetaminophen] Hives    Hives occur after 5 doses   . Betadine [Povidone Iodine] Rash and Other (See Comments)    blisters  . Iodine Rash  . Sulfa Antibiotics Diarrhea and Nausea Only    VITALS:  Blood pressure 100/73, pulse 100, temperature 100 F (37.8 C), temperature source Axillary, resp. rate 15, height $RemoveBe'5\' 6"'BPLTKGjyc$  (1.676 m), weight 83.6 kg, SpO2 (!) 82 %.  PHYSICAL EXAMINATION:   Physical Examcomfor tcare  GENERAL:  80 y.o.-year-old patient lying in the bed with no acute distress.  LUNGS: Normal breath sounds  bilaterally, no wheezing, rales, rhonchi. No use of accessory muscles of respiration.  CARDIOVASCULAR: S1, S2 normal. No murmurs, rubs, or gallops. PSYCHIATRIC:  patient is lethrgic   LABORATORY PANEL:  CBC Recent Labs  Lab 2020/10/24 0344  WBC 6.1  HGB 9.6*  HCT 29.9*  PLT 165    Chemistries  Recent Labs  Lab 10/06/20 1358 10/06/20 1803 October 24, 2020 0344  NA 136   < > 139  K 5.0   < > 4.2  CL 100   < > 105  CO2 25   < > 24  GLUCOSE 119*   < > 103*  BUN 44*   < > 31*  CREATININE 2.86*   < > 1.78*  CALCIUM 8.8*   < > 8.6*  MG  --    < > 2.4  AST 25  --   --   ALT 23  --   --   ALKPHOS 69  --   --   BILITOT 0.9  --   --    < > = values in this interval not displayed.   Cardiac Enzymes No results for input(s): TROPONINI in the last 168 hours. RADIOLOGY:  DG Chest Port 1 View  Result Date: 10/06/2020 CLINICAL DATA:  PICC line placement. EXAM: PORTABLE CHEST 1 VIEW COMPARISON:  Earlier film, same date. FINDINGS: The right PICC line is in good position with the tip in the distal SVC near the cavoatrial junction. Stable cardiac enlargement, pulmonary infiltrates and pleural effusions. IMPRESSION:  Right PICC line in good position with the tip in the distal SVC near the cavoatrial junction. Electronically Signed   By: Marijo Sanes M.D.   On: 10/06/2020 17:28   ECHOCARDIOGRAM COMPLETE  Result Date: 10/07/2020    ECHOCARDIOGRAM REPORT   Patient Name:   Noelani B Markell Date of Exam: 10/07/2020 Medical Rec #:  [id number]   Height:       66.0 in Accession #:    [id number]  Weight:       183.2 lb Date of Birth:  17-Oct-1940   BSA:          1.927 m Patient Age:    80 years    BP:           115/58 mmHg Patient Gender: F           HR:           79 bpm. Exam Location:  [facility] Procedure: 2D Echo, Color Doppler and Cardiac Doppler Indications:     CHF-acute diastolic E28.00  History:         Patient has prior history of Echocardiogram examinations, most                  recent 03/13/2020. Risk  Factors:Hypertension.  Sonographer:     Sherrie Sport RDCS (AE) Referring Phys:  [phone] CHRISTOPHER END Diagnosing Phys: Kate Sable MD  Sonographer Comments: No apical window. IMPRESSIONS  1. Left ventricular ejection fraction, by estimation, is 60 to 65%. The left ventricle has normal function. The left ventricle has no regional wall motion abnormalities. There is mild left ventricular hypertrophy. Left ventricular diastolic parameters are indeterminate.  2. Right ventricular systolic function is normal. The right ventricular size is normal.  3. Left atrial size was moderately dilated.  4. The mitral valve is normal in structure. Trivial mitral valve regurgitation.  5. The aortic valve is tricuspid. Aortic valve regurgitation is mild to moderate. Mild aortic valve sclerosis is present, with no evidence of aortic valve stenosis.  6. The inferior vena cava is dilated in size with <50% respiratory variability, suggesting right atrial pressure of 15 mmHg. FINDINGS  Left Ventricle: Left ventricular ejection fraction, by estimation, is 60 to 65%. The left ventricle has normal function. The left ventricle has no regional wall motion abnormalities. The left ventricular internal cavity size was normal in size. There is  mild left ventricular hypertrophy. Left ventricular diastolic parameters are indeterminate. Right Ventricle: The right ventricular size is normal. No increase in right ventricular wall thickness. Right ventricular systolic function is normal. Left Atrium: Left atrial size was moderately dilated. Right Atrium: Right atrial size was not well visualized. Pericardium: There is no evidence of pericardial effusion. Mitral Valve: The mitral valve is normal in structure. Trivial mitral valve regurgitation. Tricuspid Valve: The tricuspid valve is normal in structure. Tricuspid valve regurgitation is mild. Aortic Valve: The aortic valve is tricuspid. Aortic valve regurgitation is mild to moderate. Mild aortic valve  sclerosis is present, with no evidence of aortic valve stenosis. Pulmonic Valve: The pulmonic valve was normal in structure. Pulmonic valve regurgitation is trivial. Aorta: The aortic root is normal in size and structure. Venous: The inferior vena cava is dilated in size with less than 50% respiratory variability, suggesting right atrial pressure of 15 mmHg. IAS/Shunts: No atrial level shunt detected by color flow Doppler.  LEFT VENTRICLE PLAX 2D LVIDd:         4.53 cm LVIDs:  2.51 cm LV PW:         1.24 cm LV IVS:        1.49 cm LVOT diam:     2.00 cm LVOT Area:     3.14 cm  LEFT ATRIUM         Index LA diam:    4.40 cm 2.28 cm/m                        PULMONIC VALVE AORTA                 PV Vmax:        0.65 m/s Ao Root diam: 3.20 cm PV Peak grad:   1.7 mmHg                       RVOT Peak grad: 2 mmHg  TRICUSPID VALVE TR Peak grad:   34.3 mmHg TR Vmax:        293.00 cm/s  SHUNTS Systemic Diam: 2.00 cm Kate Sable MD Electronically signed by Kate Sable MD Signature Date/Time: 10/07/2020/1:49:04 PM    Final    [place] EKG SITE RITE  Result Date: 10/06/2020 If Site Rite image not attached, placement could not be confirmed due to current cardiac rhythm.  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 She apparently fell at home about 2 weeks ago and sustained a laceration injury to her shin with significant bleeding reported. She has since followed up with wound care clinic.  Patient was transferred to the ICU with possible ventricular arrhythmia.  Acute on chronic hypoxic respiratory failure secondary to acute diastolic heart failure with history of recent COVID 19 infection about two weeks  ago on chronic oxygen at home  Sepsis was suspected on admission--source left LE wound infection  Paroxysmal atrial fibrillation on Xarelto mild elevated troponin suspect demand ischemia episode of ? V-Tach  Acute blood loss anemia likely from lower extremity bleeding wound  Left lower extremity wound infection secondary to injury  Right shoulder pain worsened with fall  history of rotator cuff tea  Anxiety   Patient overall has a poor prognosis and declined rapidly. Discussion was made by family with palliative care. There in agreement with comfort care since to not wish patient to suffer. Patient currently is on morphine drip.  Transfer to medical floor  Family communication : son and sister at bedside Consults :ID, cardiology CODE STATUS: DNR DVT Prophylaxis : SCD Level of care: Med-Surg Status is: Inpatient   Dispo: The patient is from: Home              Anticipated d/c is to:TBD               Patient currently under comfort care and on morphine drip   difficult to place patient No  TOC for hospice referral      TOTAL TIME TAKING CARE OF THIS PATIENT: 26minutes.  >50% time spent on counselling and coordination of care  Note: This dictation was prepared with Dragon dictation along with smaller phrase technology. Any transcriptional errors that result from this process are unintentional.  Fritzi Mandes M.D    [facility]   CC: Primary care physician;  Rusty Aus, MDPatient ID: Liana Gerold, female   DOB: [birth year], 80 y.o.   MRN: [id number]

## 2020-10-21 NOTE — Progress Notes (Signed)
Critical care note:  Date of note: 10/01/2020  Subjective: The patient has been feeling anxious earlier this morning and was given p.o. Xanax and IV morphine sulfate.  She became tachycardic with wide-complex tachycardia likely ectopic atrial on monitor though EKG showed atrial flutter with left bundle blanch block.  She denies any chest pain or palpitations.  She was fairly delirious and restless but not combative.  She would answer questions appropriately and follow commands.  She was desaturating to mid 80s.  Objective: Physical examination, Generally: Acutely ill elderly Caucasian female in mild to moderate respiratory distress. Vital signs per history of present illness. Head - atraumatic, normocephalic.  Pupils - equal, round and reactive to light and accommodation. Extraocular movements are intact. No scleral icterus.  Oropharynx - moist mucous membranes and tongue. No pharyngeal erythema or exudate.  Neck - supple. No JVD. Carotid pulses 2+ bilaterally. No carotid bruits. No palpable thyromegaly or lymphadenopathy. Cardiovascular - regular rate and rhythm. Normal S1 and S2. No murmurs, gallops or rubs.  Lungs - clear to auscultation bilaterally.  Abdomen - soft and nontender. Positive bowel sounds. No palpable organomegaly or masses.  Extremities - no pitting edema, clubbing or cyanosis.  Neuro - grossly non-focal.  She is moving all 4 extremities.  She was restless and agitated.  She would answer questions appropriately though and was alert and oriented. Skin - no rashes. Breast, pelvic and rectal - deferred.  Labs and notes reviewed.  ABG with pH-7.43, pCO2-32, pO2-82, HCO3-21.2  Assessment/plan:  1.  Acute on chronic hypoxic respiratory failure due to acute diastolic CHF. - The patient was placed on high flow nasal cannula at 88% with improvement of her pulse oximetry to 96%.  2.  Altered mental status with confusion and restlessness as well as urinary retention. - Patient  was given 1 mg of IM Haldol with improvement of her agitation. - We will check troponin I. - The patient has urinary retention with bladder scan as 380 and upon insertion of In-and-Out catheter which had 500 mL out.   3.  Wide-complex tachycardia. - She has been up to 130 with significant agitation and later down to 113 close to her baseline yesterday. - We will utilize as needed IV Lopressor as recommended by cardiology as needed.  It was held off for now given borderline blood pressure.  Will continue other current plan of care.  Authorized and performed by: Valente David, MD Total critical care time: Approximately 40  minutes. Due to a high probability of clinically significant, life-threatening deterioration, the patient required my highest level of preparedness to intervene emergently and I personally spent this critical care time directly and personally managing the patient.  This critical care time included obtaining a history, examining the patient, pulse oximetry, ordering and review of studies, arranging urgent treatment with development of management plan, evaluation of patient's response to treatment, frequent reassessment, and discussions with other providers. This critical care time was performed to assess and manage the high probability of imminent, life-threatening deterioration that could result in multiorgan failure.  It was exclusive of separately billable procedures and treating other patients and teaching time.  Please see MDM section and the rest of the note for further information on patient assessment and treatment.

## 2020-10-21 NOTE — Progress Notes (Signed)
Progress Note  Patient Name: [name] Date of Encounter: 2020-10-15  [facility] Cardiologist: Julien Nordmann, MD   Subjective   Difficult 24 hours, Increased work of breathing, agitation, confusion, Given Xanax, oxycodone for shoulder pain This morning thrusting around the bed,  moving arms and legs sporadically,  but not indicating she was in pain Alert, communicative with confusion  Inpatient Medications    Scheduled Meds: . haloperidol lactate  1 mg Intravenous Q4H  . sodium chloride flush  10-40 mL Intracatheter Q12H  . sodium chloride flush  3 mL Intravenous Q12H   Continuous Infusions: . sodium chloride    . morphine 3 mg/hr (Oct 15, 2020 1110)   PRN Meds: sodium chloride, acetaminophen, acetaminophen, antiseptic oral rinse, diphenhydrAMINE, glycopyrrolate, LORazepam, morphine injection, morphine, ondansetron (ZOFRAN) IV, polyvinyl alcohol, sodium chloride, sodium chloride flush, sodium chloride flush   Vital Signs    Vitals:   10-15-20 0630 2020-10-15 0700 10-15-20 0724 10/15/20 0800  BP:  94/61  100/73  Pulse:  99  100  Resp:  18  15  Temp:   100 F (37.8 C) 100 F (37.8 C)  TempSrc:   Axillary Axillary  SpO2: 94% 99% 94% (!) 82%  Weight:      Height:        Intake/Output Summary (Last 24 hours) at 10/15/20 1625 Last data filed at Oct 15, 2020 0641 Gross per 24 hour  Intake 401.09 ml  Output 1100 ml  Net -698.91 ml   Last 3 Weights 10-15-20 10/07/2020 10/06/2020  Weight (lbs) 184 lb 4.9 oz 183 lb 3.2 oz 185 lb 10 oz  Weight (kg) 83.6 kg 83.1 kg 84.2 kg      Telemetry    Sinus tachycardia- Personally Reviewed  ECG    September 12, 2020 8:10 AM narrow complex normal sinus rhythm- Personally Reviewed  10/15/2020 6:27 AM wide-complex tachycardia rate 119 bpm  Physical Exam   GEN: No acute distress.   Neck: No JVD Cardiac: RRR, no murmurs, rubs, or gallops.  Respiratory: Clear to auscultation bilaterally. GI: Soft, nontender, non-distended   MS: No edema; No deformity. Neuro:  Nonfocal  Psych: Normal affect   Labs    High Sensitivity Troponin:   Recent Labs  Lab 09/12/20 0949 09/28/2020 1145 10/20/2020 1636 10/06/20 1358 10/06/20 1803  TROPONINIHS 17 29* 27* 18* 18*      Chemistry Recent Labs  Lab 10/06/20 1358 10/07/20 0435 15-Oct-2020 0344  NA 136 139 139  K 5.0 4.0 4.2  CL 100 105 105  CO2 25 25 24   GLUCOSE 119* 98 103*  BUN 44* 41* 31*  CREATININE 2.86* 2.54* 1.78*  CALCIUM 8.8* 8.4* 8.6*  PROT 5.3*  --   --   ALBUMIN 2.5*  --   --   AST 25  --   --   ALT 23  --   --   ALKPHOS 69  --   --   BILITOT 0.9  --   --   GFRNONAA 16* 19* 29*  ANIONGAP 11 9 10      Hematology Recent Labs  Lab 10/06/20 1358 10/07/20 0435 10-15-2020 0344  WBC 5.4 4.5 6.1  RBC 2.48* 2.55* 3.05*  HGB 7.9* 8.1* 9.6*  HCT 25.0* 25.1* 29.9*  MCV 100.8* 98.4 98.0  MCH 31.9 31.8 31.5  MCHC 31.6 32.3 32.1  RDW 19.9* 19.3* 18.4*  PLT 181 157 165    BNP Recent Labs  Lab 10/14/2020 1145  BNP 2,283.6*     DDimer  No results for input(s): DDIMER in the last 168 hours.   Radiology    DG Chest Port 1 View  Result Date: 10/06/2020 CLINICAL DATA:  PICC line placement. EXAM: PORTABLE CHEST 1 VIEW COMPARISON:  Earlier film, same date. FINDINGS: The right PICC line is in good position with the tip in the distal SVC near the cavoatrial junction. Stable cardiac enlargement, pulmonary infiltrates and pleural effusions. IMPRESSION: Right PICC line in good position with the tip in the distal SVC near the cavoatrial junction. Electronically Signed   By: Rudie Meyer M.D.   On: 10/06/2020 17:28   ECHOCARDIOGRAM COMPLETE  Result Date: 10/07/2020    ECHOCARDIOGRAM REPORT   Patient Name:   [name] Date of Exam: 10/07/2020 Medical Rec #:  [id number]   Height:       66.0 in Accession #:    [id number]  Weight:       183.2 lb Date of Birth:  [birth year]   BSA:          1.927 m Patient Age:    80 years    BP:           115/58 mmHg Patient Gender:  F           HR:           79 bpm. Exam Location:  [facility] Procedure: 2D Echo, Color Doppler and Cardiac Doppler Indications:     CHF-acute diastolic I50.31  History:         Patient has prior history of Echocardiogram examinations, most                  recent 03/13/2020. Risk Factors:Hypertension.  Sonographer:     Cristela Blue RDCS (AE) Referring Phys:  [phone] CHRISTOPHER END Diagnosing Phys: Debbe Odea MD  Sonographer Comments: No apical window. IMPRESSIONS  1. Left ventricular ejection fraction, by estimation, is 60 to 65%. The left ventricle has normal function. The left ventricle has no regional wall motion abnormalities. There is mild left ventricular hypertrophy. Left ventricular diastolic parameters are indeterminate.  2. Right ventricular systolic function is normal. The right ventricular size is normal.  3. Left atrial size was moderately dilated.  4. The mitral valve is normal in structure. Trivial mitral valve regurgitation.  5. The aortic valve is tricuspid. Aortic valve regurgitation is mild to moderate. Mild aortic valve sclerosis is present, with no evidence of aortic valve stenosis.  6. The inferior vena cava is dilated in size with <50% respiratory variability, suggesting right atrial pressure of 15 mmHg. FINDINGS  Left Ventricle: Left ventricular ejection fraction, by estimation, is 60 to 65%. The left ventricle has normal function. The left ventricle has no regional wall motion abnormalities. The left ventricular internal cavity size was normal in size. There is  mild left ventricular hypertrophy. Left ventricular diastolic parameters are indeterminate. Right Ventricle: The right ventricular size is normal. No increase in right ventricular wall thickness. Right ventricular systolic function is normal. Left Atrium: Left atrial size was moderately dilated. Right Atrium: Right atrial size was not well visualized. Pericardium: There is no evidence of pericardial effusion. Mitral Valve: The mitral  valve is normal in structure. Trivial mitral valve regurgitation. Tricuspid Valve: The tricuspid valve is normal in structure. Tricuspid valve regurgitation is mild. Aortic Valve: The aortic valve is tricuspid. Aortic valve regurgitation is mild to moderate. Mild aortic valve sclerosis is present, with no evidence of aortic valve stenosis. Pulmonic Valve: The pulmonic valve was normal in structure.  Pulmonic valve regurgitation is trivial. Aorta: The aortic root is normal in size and structure. Venous: The inferior vena cava is dilated in size with less than 50% respiratory variability, suggesting right atrial pressure of 15 mmHg. IAS/Shunts: No atrial level shunt detected by color flow Doppler.  LEFT VENTRICLE PLAX 2D LVIDd:         4.53 cm LVIDs:         2.51 cm LV PW:         1.24 cm LV IVS:        1.49 cm LVOT diam:     2.00 cm LVOT Area:     3.14 cm  LEFT ATRIUM         Index LA diam:    4.40 cm 2.28 cm/m                        PULMONIC VALVE AORTA                 PV Vmax:        0.65 m/s Ao Root diam: 3.20 cm PV Peak grad:   1.7 mmHg                       RVOT Peak grad: 2 mmHg  TRICUSPID VALVE TR Peak grad:   34.3 mmHg TR Vmax:        293.00 cm/s  SHUNTS Systemic Diam: 2.00 cm Debbe Odea MD Electronically signed by Debbe Odea MD Signature Date/Time: 10/07/2020/1:49:04 PM    Final     Cardiac Studies  Echocardiogram Normal LV function estimated 60%, dilated IVC  Patient Profile     80 y.o. female   Assessment & Plan    1.  Paroxysmal A. Fib Paroxysmal tachycardia overnight, Back to normal sinus rhythm this morning/sinus tachycardia Appears Eliquis had been held for blood loss anemia requiring transfusion She continues on flecainide  2.  Questionable wide-complex tachycardia Evaluated by EP, likely A. fib with aberrancy. -Continue flecainide as prescribed. Consider Lopressor  3.  HFpEF Continue Lasix  4.  Lower extremity wound, possible infection -Antibiotics as per  ID  5.  Delirium ICU delirium, Family at the bedside6  Long discussion with family at the bedside  Total encounter time more than 35 minutes  Greater than 50% was spent in counseling and coordination of care with the patient   For questions or updates, please contact [facility] Please consult [email or web address] for contact info under        Signed, Julien Nordmann, MD  10/12/2020, 4:25 PM

## 2020-10-21 NOTE — Progress Notes (Signed)
Family (son and 2 POA) had meeting with Palliative NP. They all decided to transition to comfort measures only.   Patient is very restless and agitated. She is all over the bed. Haldol 2 mg IV x 1 dose given. Patient got situated in bed. Family step out in the waiting room at this time.   Morphine 2mg  IVP given and Morphine drip started.   Palliative NP at bedside and ordered to put foley catheter for comfort since she has some retention. Foley catheter inserted.

## 2020-10-21 NOTE — Death Summary Note (Signed)
DEATH SUMMARY   Patient Details  Name: [name] MRN: [id number] DOB: 1940/08/15  Admission/Discharge Information   Admit Date:  Oct 19, 2020  Date of Death: Date of Death: 10/22/2020  Time of Death: Time of Death: 22-Sep-2022  Length of Stay: 4  Referring Physician: Danella Penton, MD   Reason(s) for Hospitalization  Shortness of breath Diagnoses  Preliminary cause of death:  Acute on Chronic respiratory failure due to CHF diastolic Sepsis suspected due to Lower extremity infection/cellulitis after trauma Secondary Diagnoses (including complications and co-morbidities):  Active Problems:   Acute kidney injury superimposed on CKD (HCC)   Anemia   CHF exacerbation (HCC)   Hypotension   Ventricular tachycardia (HCC)   Acute blood loss anemia   Open wound of left lower extremity   Paroxysmal A-fib (HCC)   Heart failure with preserved ejection fraction [facility])   Pain   Brief Hospital Course (including significant findings, care, treatment, and services provided and events leading to death)    [name] ,80 y.o. female  with a past medical history significant for chronic respiratory failure on 1 to 2 L/min of oxygen at home, atrial fibrillation on chronic anticoagulation therapy with xarelto, history of HFpEF (LVEF of 55-60% in 02/2020), hypertension, recent admission for COVID 19 approximately 2 weeks ago.  She apparently fell at home about 2 weeks ago and sustained a laceration injury to her shin with significant bleeding reported. She has since followed up with wound care clinic. Patient was transferred to the ICU with possible ventricular arrhythmia.   Acute on chronic hypoxic respiratory failure secondary to acute diastolic heart failure with history of recent COVID 19 infection about two weeks ago on chronic oxygen at home   Sepsis was suspected on admission--source left LE wound infection   Paroxysmal atrial fibrillation on Xarelto mild elevated troponin suspect demand  ischemia episode of ? V-Tach   Acute blood loss anemia likely from lower extremity bleeding wound   Left lower extremity wound infection secondary to injury   Right shoulder pain worsened with fall  history of rotator cuff tea   Anxiety    Patient overall has a poor prognosis and declined rapidly. Discussion was held by family with palliative care. They are in agreement with comfort care since to not wish patient to suffer. Patient currently was thereafter placed on IV morphine drip.    Pt peacefully passed away 10-22-20 at 2022-09-22 hour  Pertinent Labs and Studies  Significant Diagnostic Studies DG Shoulder Right  Result Date: 10/06/2020 CLINICAL DATA:  RIGHT shoulder pain. EXAM: RIGHT SHOULDER - 2+ VIEW COMPARISON:  Chest x-ray on 19-Oct-2020 FINDINGS: There are mild degenerative changes in the shoulder, similar in appearance to the prior study. No acute fracture or subluxation. RIGHT lung apex is unremarkable. IMPRESSION: No evidence for acute  abnormality. Electronically Signed   By: Norva Pavlov M.D.   On: 10/06/2020 14:43   DG Tibia/Fibula Left  Result Date: 10-19-20 CLINICAL DATA:  Wound infection. EXAM: LEFT TIBIA AND FIBULA - 2 VIEW COMPARISON:  06/11/2019 FINDINGS: The knee and ankle joints are maintained. There is a bandage over the midportion of the left leg. There appears to be gas in the soft tissues which may be related to an open wound. I do not see any plain film findings for osteomyelitis. IMPRESSION: 1. Gas in the soft tissues may be related to an open wound. 2. No plain film findings for osteomyelitis. Electronically Signed   By: Orlene Plum.D.  On: 10/10/2020 12:54   DG Chest Port 1 View  Result Date: 10/06/2020 CLINICAL DATA:  PICC line placement. EXAM: PORTABLE CHEST 1 VIEW COMPARISON:  Earlier film, same date. FINDINGS: The right PICC line is in good position with the tip in the distal SVC near the cavoatrial junction. Stable cardiac enlargement, pulmonary  infiltrates and pleural effusions. IMPRESSION: Right PICC line in good position with the tip in the distal SVC near the cavoatrial junction. Electronically Signed   By: Rudie Meyer M.D.   On: 10/06/2020 17:28   DG Chest Port 1 View  Result Date: 10/06/2020 CLINICAL DATA:  Acute respiratory failure.  Hypoxia. EXAM: PORTABLE CHEST 1 VIEW COMPARISON:  10/18/2020 FINDINGS: Stable cardiomegaly. Patchy infiltrates are seen bilaterally, increased in mid lung zones compared to prior studies. Patient is rotated towards the LEFT. IMPRESSION: Increased pulmonary infiltrates.  Stable cardiomegaly. Electronically Signed   By: Norva Pavlov M.D.   On: 10/06/2020 14:44   DG Chest Portable 1 View  Result Date: 10/13/2020 CLINICAL DATA:  Shortness of breath, low O2 sat. EXAM: PORTABLE CHEST 1 VIEW COMPARISON:  09/14/2020 and CT chest 03/14/2020. FINDINGS: Trachea is midline. Heart is enlarged. There is patchy bilateral airspace opacification with slight coarsening and bronchiectasis. Blunting of the left costophrenic angle. IMPRESSION: 1. Patchy bilateral airspace opacification is likely due to pneumonia. Difficult to exclude edema. 2. Findings appear to be superimposed on a component of chronic interstitial lung disease. 3. Blunting of the left costophrenic angle may be due to pleural fluid. Electronically Signed   By: Leanna Battles M.D.   On: 10/12/2020 12:54   DG Chest Port 1 View  Result Date: 09/14/2020 CLINICAL DATA:  COVID-19.  Shortness of breath. EXAM: PORTABLE CHEST 1 VIEW COMPARISON:  September 06, 2020. FINDINGS: Stable cardiomegaly. No pneumothorax is noted. Decreased bibasilar opacities are noted suggesting improving atelectasis or edema. Stable large hiatal hernia. Bony thorax is unremarkable. IMPRESSION: Decreased bibasilar opacities are noted as described above. Electronically Signed   By: Lupita Raider M.D.   On: 09/14/2020 08:57   ECHOCARDIOGRAM COMPLETE  Result Date: 10/07/2020     ECHOCARDIOGRAM REPORT   Patient Name:   [name] Date of Exam: 10/07/2020 Medical Rec #:  [id number]   Height:       66.0 in Accession #:    [id number]  Weight:       183.2 lb Date of Birth:  1940/12/13   BSA:          1.927 m Patient Age:    74 years    BP:           115/58 mmHg Patient Gender: F           HR:           79 bpm. Exam Location:  [facility] Procedure: 2D Echo, Color Doppler and Cardiac Doppler Indications:     CHF-acute diastolic I50.31  History:         Patient has prior history of Echocardiogram examinations, most                  recent 03/13/2020. Risk Factors:Hypertension.  Sonographer:     Cristela Blue RDCS (AE) Referring Phys:  [phone] CHRISTOPHER END Diagnosing Phys: Debbe Odea MD  Sonographer Comments: No apical window. IMPRESSIONS  1. Left ventricular ejection fraction, by estimation, is 60 to 65%. The left ventricle has normal function. The left ventricle has no regional wall motion abnormalities. There is mild left ventricular hypertrophy.  Left ventricular diastolic parameters are indeterminate.  2. Right ventricular systolic function is normal. The right ventricular size is normal.  3. Left atrial size was moderately dilated.  4. The mitral valve is normal in structure. Trivial mitral valve regurgitation.  5. The aortic valve is tricuspid. Aortic valve regurgitation is mild to moderate. Mild aortic valve sclerosis is present, with no evidence of aortic valve stenosis.  6. The inferior vena cava is dilated in size with <50% respiratory variability, suggesting right atrial pressure of 15 mmHg. FINDINGS  Left Ventricle: Left ventricular ejection fraction, by estimation, is 60 to 65%. The left ventricle has normal function. The left ventricle has no regional wall motion abnormalities. The left ventricular internal cavity size was normal in size. There is  mild left ventricular hypertrophy. Left ventricular diastolic parameters are indeterminate. Right Ventricle: The right ventricular size is  normal. No increase in right ventricular wall thickness. Right ventricular systolic function is normal. Left Atrium: Left atrial size was moderately dilated. Right Atrium: Right atrial size was not well visualized. Pericardium: There is no evidence of pericardial effusion. Mitral Valve: The mitral valve is normal in structure. Trivial mitral valve regurgitation. Tricuspid Valve: The tricuspid valve is normal in structure. Tricuspid valve regurgitation is mild. Aortic Valve: The aortic valve is tricuspid. Aortic valve regurgitation is mild to moderate. Mild aortic valve sclerosis is present, with no evidence of aortic valve stenosis. Pulmonic Valve: The pulmonic valve was normal in structure. Pulmonic valve regurgitation is trivial. Aorta: The aortic root is normal in size and structure. Venous: The inferior vena cava is dilated in size with less than 50% respiratory variability, suggesting right atrial pressure of 15 mmHg. IAS/Shunts: No atrial level shunt detected by color flow Doppler.  LEFT VENTRICLE PLAX 2D LVIDd:         4.53 cm LVIDs:         2.51 cm LV PW:         1.24 cm LV IVS:        1.49 cm LVOT diam:     2.00 cm LVOT Area:     3.14 cm  LEFT ATRIUM         Index LA diam:    4.40 cm 2.28 cm/m                        PULMONIC VALVE AORTA                 PV Vmax:        0.65 m/s Ao Root diam: 3.20 cm PV Peak grad:   1.7 mmHg                       RVOT Peak grad: 2 mmHg  TRICUSPID VALVE TR Peak grad:   34.3 mmHg TR Vmax:        293.00 cm/s  SHUNTS Systemic Diam: 2.00 cm Debbe Odea MD Electronically signed by Debbe Odea MD Signature Date/Time: 10/07/2020/1:49:04 PM    Final    [place] EKG SITE RITE  Result Date: 10/06/2020 If Site Rite image not attached, placement could not be confirmed due to current cardiac rhythm.   Microbiology Recent Results (from the past 240 hour(s))  SARS CORONAVIRUS 2 (TAT 6-24 HRS) Nasopharyngeal Nasopharyngeal Swab     Status: None   Collection Time: 10/18/2020   4:36 PM   Specimen: Nasopharyngeal Swab  Result Value Ref Range Status   SARS Coronavirus 2 NEGATIVE  NEGATIVE Final    Comment: (NOTE) SARS-CoV-2 target nucleic acids are NOT DETECTED.  The SARS-CoV-2 RNA is generally detectable in upper and lower respiratory specimens during the acute phase of infection. Negative results do not preclude SARS-CoV-2 infection, do not rule out co-infections with other pathogens, and should not be used as the sole basis for treatment or other patient management decisions. Negative results must be combined with clinical observations, patient history, and epidemiological information. The expected result is Negative.  Fact Sheet for Patients: HairSlick.no  Fact Sheet for Healthcare Providers: quierodirigir.com  This test is not yet approved or cleared by the [place] FDA and  has been authorized for detection and/or diagnosis of SARS-CoV-2 by FDA under an Emergency Use Authorization (EUA). This EUA will remain  in effect (meaning this test can be used) for the duration of the COVID-19 declaration under Se ction 564(b)(1) of the Act, 21 U.S.C. section 360bbb-3(b)(1), unless the authorization is terminated or revoked sooner.  Performed at [facility], [address] [place], [place] [address]   CULTURE, BLOOD (ROUTINE X 2) w Reflex to ID Panel     Status: None   Collection Time: 10/06/20  8:12 AM   Specimen: BLOOD  Result Value Ref Range Status   Specimen Description BLOOD LEFT ANTECUBITAL  Final   Special Requests   Final    BOTTLES DRAWN AEROBIC AND ANAEROBIC Blood Culture results may not be optimal due to an excessive volume of blood received in culture bottles   Culture   Final    NO GROWTH 5 DAYS Performed at [facility], [address] [place], [place] [address]    Report Status 10/11/2020 FINAL  Final  CULTURE, BLOOD (ROUTINE X 2) w Reflex to ID Panel     Status:  None   Collection Time: 10/06/20  8:21 AM   Specimen: BLOOD  Result Value Ref Range Status   Specimen Description BLOOD LEFT ANTECUBITAL  Final   Special Requests   Final    BOTTLES DRAWN AEROBIC AND ANAEROBIC Blood Culture results may not be optimal due to an excessive volume of blood received in culture bottles   Culture   Final    NO GROWTH 5 DAYS Performed at [facility], [address] [place], [place] [address]    Report Status 10/11/2020 FINAL  Final  Aerobic Culture w Gram Stain (superficial specimen)     Status: None   Collection Time: 10/06/20  2:54 PM   Specimen: Wound  Result Value Ref Range Status   Specimen Description   Final    WOUND Performed at [facility], [address] [place], [place] [address]    Special Requests   Final    LEFT LEG Performed at [facility], [address] [place], [place] [address]    Gram Stain   Final    FEW WBC PRESENT, PREDOMINANTLY PMN RARE GRAM POSITIVE COCCI IN PAIRS Performed at [facility], [address], [place], [place] [address]    Culture   Final    RARE ENTEROCOCCUS FAECALIS RARE PSEUDOMONAS PUTIDA    Report Status 10/11/2020 FINAL  Final   Organism ID, Bacteria ENTEROCOCCUS FAECALIS  Final   Organism ID, Bacteria PSEUDOMONAS PUTIDA  Final      Susceptibility   Enterococcus faecalis - MIC*    AMPICILLIN <=2 SENSITIVE Sensitive     VANCOMYCIN 1 SENSITIVE Sensitive     GENTAMICIN SYNERGY SENSITIVE Sensitive     *  RARE ENTEROCOCCUS FAECALIS   Pseudomonas putida - MIC*    CEFTAZIDIME 4 SENSITIVE Sensitive     CIPROFLOXACIN <=0.25 SENSITIVE Sensitive     GENTAMICIN <=1 SENSITIVE Sensitive     IMIPENEM 2 SENSITIVE Sensitive     PIP/TAZO 8 SENSITIVE Sensitive     * RARE PSEUDOMONAS PUTIDA  MRSA PCR Screening     Status: None   Collection Time: 10/06/20  2:55 PM   Specimen: Nasal Mucosa; Nasopharyngeal  Result Value Ref Range Status   MRSA by PCR NEGATIVE NEGATIVE Final     Comment:        The GeneXpert MRSA Assay (FDA approved for NASAL specimens only), is one component of a comprehensive MRSA colonization surveillance program. It is not intended to diagnose MRSA infection nor to guide or monitor treatment for MRSA infections. Performed at [facility], [address] [place], [place] [address]     Lab Basic Metabolic Panel: Recent Labs  Lab 10/06/20 1803 10/07/20 0435 09/21/2020 0344  NA  --  139 139  K  --  4.0 4.2  CL  --  105 105  CO2  --  25 24  GLUCOSE  --  98 103*  BUN  --  41* 31*  CREATININE  --  2.54* 1.78*  CALCIUM  --  8.4* 8.6*  MG 3.1* 3.1* 2.4  PHOS  --  4.8* 3.5   Liver Function Tests: No results for input(s): AST, ALT, ALKPHOS, BILITOT, PROT, ALBUMIN in the last 168 hours. No results for input(s): LIPASE, AMYLASE in the last 168 hours. No results for input(s): AMMONIA in the last 168 hours. CBC: Recent Labs  Lab 10/07/20 0435 10/04/2020 0344  WBC 4.5 6.1  HGB 8.1* 9.6*  HCT 25.1* 29.9*  MCV 98.4 98.0  PLT 157 165   Cardiac Enzymes: No results for input(s): CKTOTAL, CKMB, CKMBINDEX, TROPONINI in the last 168 hours. Sepsis Labs: Recent Labs  Lab 10/06/20 1803 10/07/20 0435 10/01/2020 0344  PROCALCITON  --  <0.10 0.15  WBC  --  4.5 6.1  LATICACIDVEN 1.1  --   --     Procedures/Operations     Enedina FinnerSona Shaely Gadberry 10/13/2020, 5:54 PM

## 2020-10-21 NOTE — Progress Notes (Signed)
Daily Progress Note   Patient Name: [name]       Date: 10/14/2020 DOB: [birth year]  Age: 80 y.o. MRN#: [id number] Attending Physician: Enedina Finner, MD Primary Care Physician: Danella Penton, MD Admit Date: 2020-10-14  Reason for Consultation/Follow-up: Establishing goals of care  Subjective: Chart Reviewed. Updates Received. Patient Assessed.   Patient is in bed in significant distress.  Now requiring high flow nasal cannula.  Increased work of breathing, some confusion noted, thrusting around, appears uncomfortable, yelling out "hurts".  Family is at the bedside (son and 2 friends/POA).  They are tearful expressing patient's significant change in the events that occurred overnight.  Purnell Shoemaker [facility]) this patient would not want to live in current condition and they do not wish to see her continue to suffer.  Emotional support provided.  We discussed at length patient's current condition and overall prognosis.  Family verbalized understanding.  They have requested to transition all care to focus on patient's comfort and end-of-life.  Extensive education provided on what comfort care would look like while hospitalized and outpatient with hospice support.  We discussed aggressive symptom management including scheduled medications and continuous drip to hopefully gain patient some comfort.  All family mutually verbalized their agreement and requested to initiate comfort measures.  Outpatient hospice discussed.  Family is requesting hospice home if patient does not pass away while hospitalized.  Education provided on referral process.  All questions answered and support provided.  Family provided with snacks and assisted to waiting room as nursing staff and myself tried to make patient comfortable.  Foley catheter placed while at the bedside and morphine drip initiated with stat Haldol.  Patient much more calmer and relaxed prior to my departure.   Length of Stay: 3 days  Vital Signs: BP  100/73   Pulse 100   Temp 100 F (37.8 C) (Axillary)   Resp 15   Ht 5\' 6"  (1.676 m)   Wt 83.6 kg   SpO2 (!) 82%   BMI 29.75 kg/m  SpO2: SpO2: (!) 82 % O2 Device: O2 Device: High Flow Nasal Cannula O2 Flow Rate: O2 Flow Rate (L/min): 50 L/min  Physical Exam: Increased work of breathing, severe agitation, patient yelling out and thrashing around in bed. Confusion noted, unable to follow commands   Palliative Care Assessment & Plan  HPI: Palliative Care consult requested for goals of care discussion in this 80 y.o. female with a medical history significant for atrial fibrillation (Xarelto), diastolic CHF, hypertension, chronic respiratory failure (1-2 L), recent COVID 19, anxiety, and GERD.  Patient was recently discharged from hospital after COVID-19 approximately 2 weeks ago.  She presented to the ED via EMS from home due to shortness of breath.  Patient has lower extremity injury s/p fall x2 weeks ago.  It was reported she was being followed by the wound care clinic and a home health nurse, currently on Bactrim.  Home health nurse noted patient to be hypoxic providing recommendations for ER work-up after discussion with patient's PCP.  Chest x-ray sequela from recent COVID infection.  BUN 43.  Creatinine 2.6.  BNP 2283.  Hemoglobin 7.4.  Patient recently transferred to ICU (10/06/2020) with possible ventricular arrhythmia and hypotension.  Code Status: DNR  Goals of Care/Recommendations: Transition all care to focus on comfort.  Patient in acute distress family at the bedside emotional.  Education provided on comfort and aggressive symptom management.  Family verbalized understanding confirming wishes for comfort measures only. Haldol scheduled for agitation  Morphine drip with bolus PRN for pain/air hunger/comfort Robinul PRN for excessive secretions Ativan PRN for agitation/anxiety Zofran PRN for nausea Liquifilm tears PRN for dry eyes May have comfort feeding Comfort cart for  family Unrestricted visitations in the setting of EOL (per policy) Oxygen PRN 2L or less for comfort. No escalation.  Hospice home referral if no hospitalized death and symptoms stable for transport. PMT will continue to support and follow as needed.  Prognosis: Days-weeks  Discharge Planning: Hospital death versus residential hospice  Thank you for allowing the Palliative Medicine Team to assist in the care of this patient.  Time Total: 70 min.   Visit consisted of counseling and education dealing with the complex and emotionally intense issues of symptom management and palliative care in the setting of serious and potentially life-threatening illness.Greater than 50%  of this time was spent counseling and coordinating care related to the above assessment and plan.  Willette Alma, AGPCNP-BC  Palliative Medicine Team [phone]

## 2020-10-21 NOTE — Progress Notes (Signed)
Handoff report received from [facility] 1905, pt family at bedside pt running morphine @ 5, pt appears to be comfortable. No s/s of distress.   Approx. 2005,pt rate change , pt appears to be air hungry, increased respirations, pt given bolus dose of morphine from pump, pt comfortable post bolus, family at bedside and updated with pt current status.

## 2020-10-21 NOTE — Progress Notes (Signed)
Chaplain Maggie joined patient and family at bedside. Offered prayer and space for storytelling upon the patient's passing.

## 2020-10-21 NOTE — Progress Notes (Signed)
Chaplain called to provide support to family of patient. Son and another family member bedside. I stepped in to check on them and assure them someone was available if and when additional support is needed. Marland Kitchen

## 2020-10-21 NOTE — Progress Notes (Signed)
Patient noted to have increased agitation/anxiety later on in the shift. PRN Xanax administered & PRN Oxycodone for pain to right shoulder administered. Patient eventually went to sleep without incident. Patient monitor started to alarm stating VTACH. Patient noted to be alert to self with increased agitation at this time , but denies pain/discomfort. EKG obtained stating Ectopic Atrial Tachycardia, but patient would not be still during this time. Patient O2 began to decrease, Respiratory contacted to place patient on heated high flow. Patient started to grab between her legs stating that she needed to pee. Patient bladder scanned with a result of 385 mls. Verbal order given by Dr. Arville Care to In & Out Cath patient. In & Out Cath completed with a result of 500 mls. Haldol 1 mg given to patient for agitation & Morphine 1 mg for pain. Patient agitation started to slowly decrease, at this time another EKG was obtained which showed Atrial Flutter. Dr. Arville Care notified. Patient able to answer questions and follow commands at this time.

## 2020-10-21 NOTE — TOC Progression Note (Signed)
Transition of Care [facility]) - Progression Note    Patient Details  Name: [name] MRN: [id number] Date of Birth: 06/26/1940  Transition of Care [facility]) CM/SW [place], [place] Phone Number: [phone] 10-25-20, 3:13 PM  Clinical Narrative:     Family and POAs met with Palliative Care NP.  Patient changes to comfort care.  Family requested hospice house  Expected Discharge Plan: [place] Barriers to Discharge: No Barriers Identified  Expected Discharge Plan and Services Expected Discharge Plan: [place] In-house Referral: Clinical Social Work   Post Acute Care Choice: [place] Living arrangements for the past 2 months: Single Family Home                                       Social Determinants of Health (SDOH) Interventions    Readmission Risk Interventions Readmission Risk Prevention Plan 06/17/2019  Transportation Screening Complete  HRI or Home Care Consult Complete  Palliative Care Screening Not Complete  Medication Review (RN Care Manager) Complete  Some recent data might be hidden

## 2020-10-21 NOTE — Progress Notes (Signed)
[facility] Room ICU05 [facility]) Hospital Liaison RN note:  Received request from Dr. Enedina Finner for family interest in Hospice Home. Chart reviewed and eligibility was approved. Call made to  friend, Purnell Shoemaker, to confirm interest and explain services. No answer. LVM. [facility] Liaison will follow up in the morning. Unfortunately, Hospice Home does not have a room to offer today. Hospital care team is aware. [facility] Liaison will continue to follow for room availability.  Please call with any hospice related questions or concerns.  Thank you for the opportunity to participate in this patient's care.  Cyndra Numbers, RN [facility] Liaison  [phone]

## 2020-10-21 DEATH — deceased

## 2020-12-08 IMAGING — CT CT ABD-PELV W/O CM
2 of 4 series · 15 of 46 positions shown, 17 images · non-contrast
Comparison: Ultrasound 02/03/2019, CT 05/03/2018

CLINICAL DATA: Abdominal pain with fever diarrhea

EXAM:
CT ABDOMEN AND PELVIS WITHOUT CONTRAST
TECHNIQUE: Multidetector CT imaging of the abdomen and pelvis was performed
following the standard protocol without IV contrast.

[Series 2: routine abd/pel wo · axial · 0.74mm/px · z∈[-1359,-914]mm · 12 of 99 slices shown, 14 images]
[im 5/99  soft-tissue]
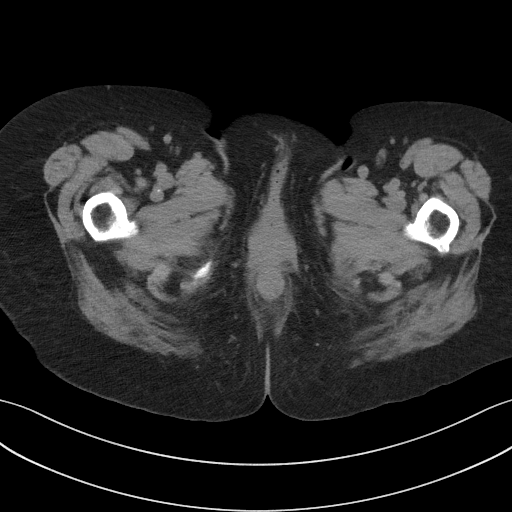
[im 5/99  bone]
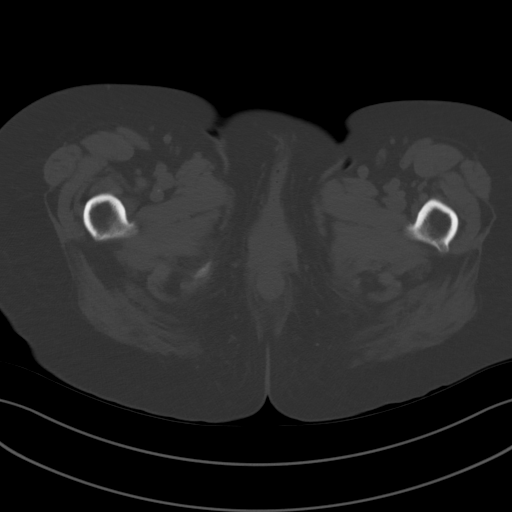
[im 13/99  soft-tissue]
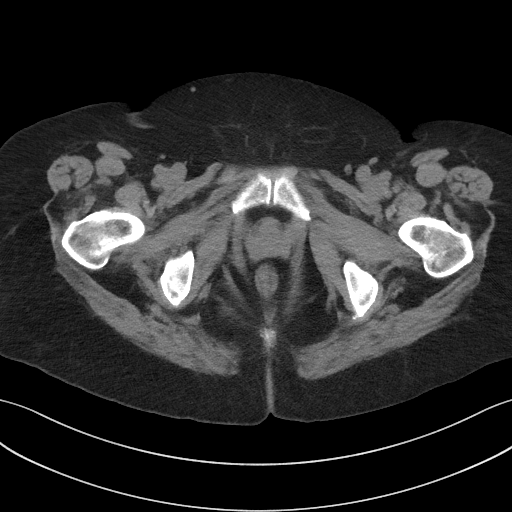
[im 21/99  soft-tissue]
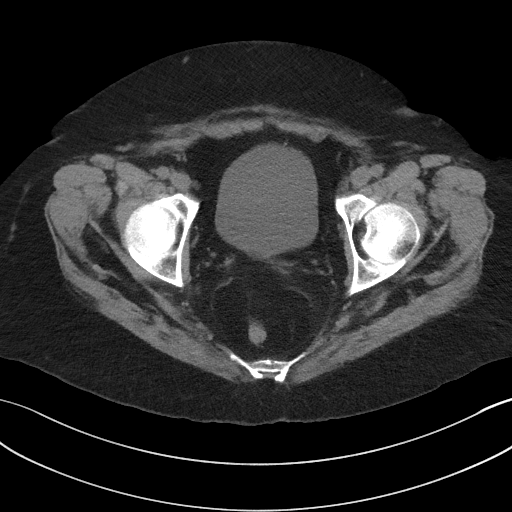
[im 29/99  soft-tissue]
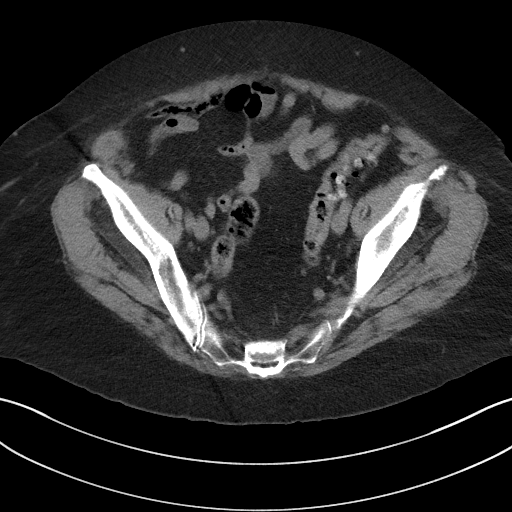
[im 37/99  soft-tissue]
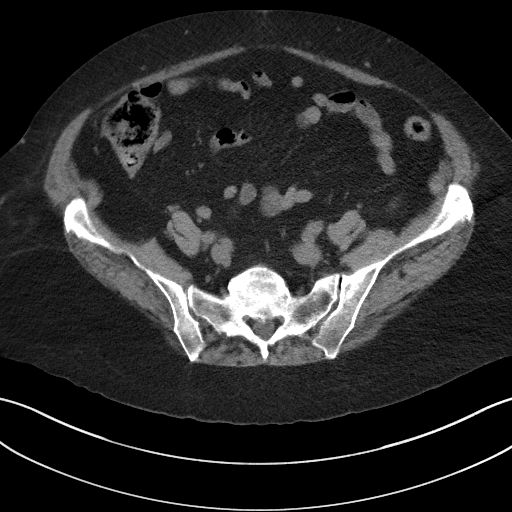
[im 45/99  soft-tissue]
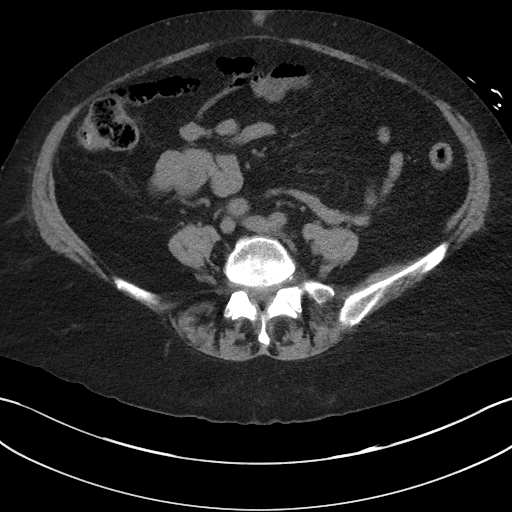
[im 54/99  soft-tissue]
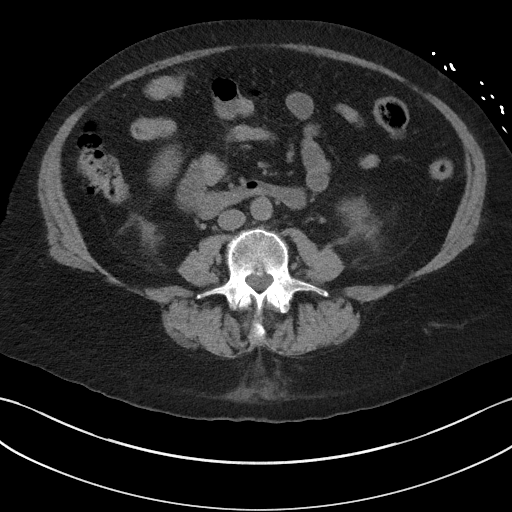
[im 62/99  soft-tissue]
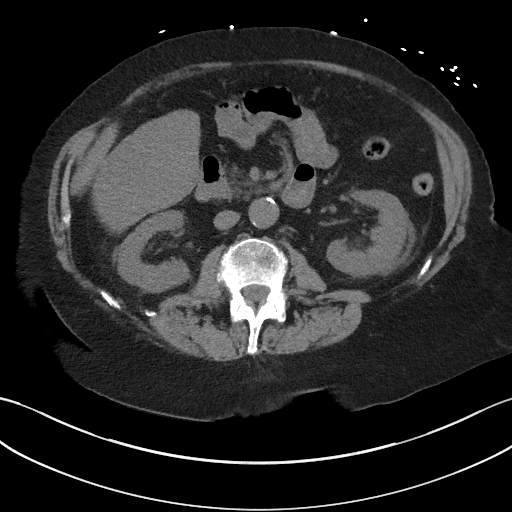
[im 70/99  soft-tissue]
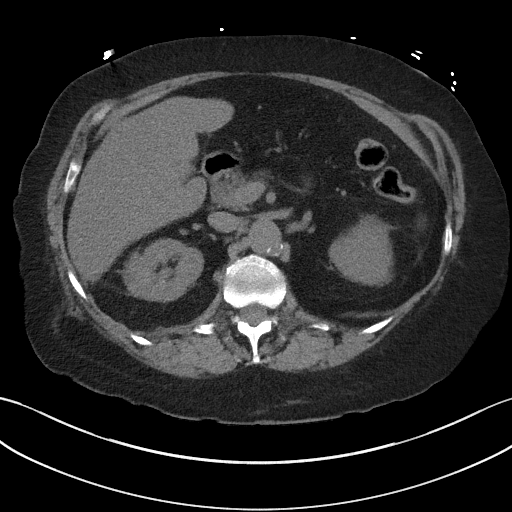
[im 70/99  bone]
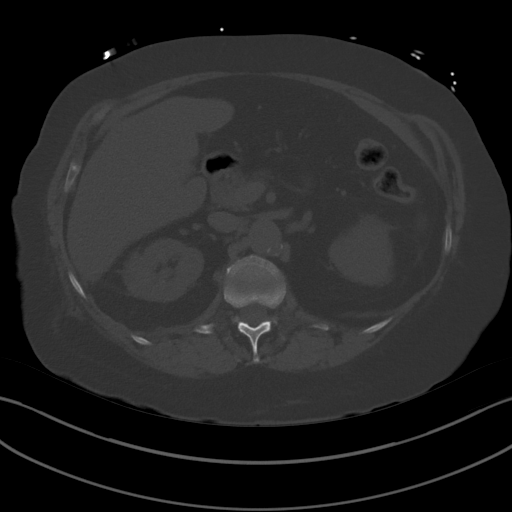
[im 78/99  soft-tissue]
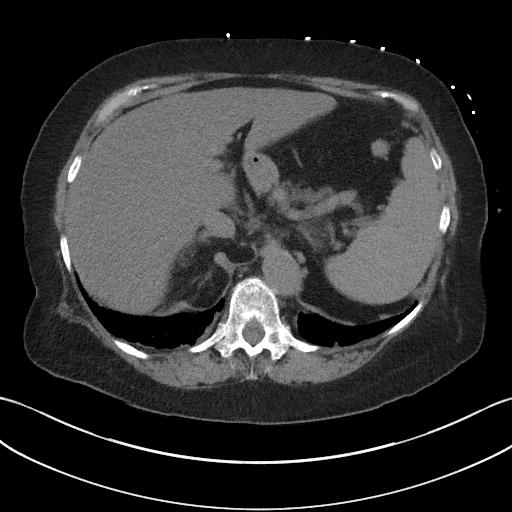
[im 86/99  soft-tissue]
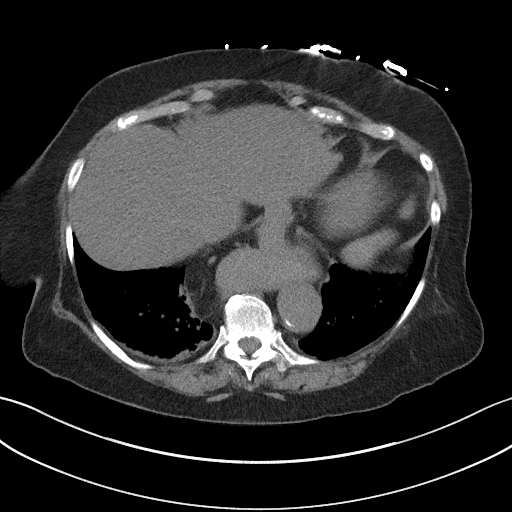
[im 94/99  soft-tissue]
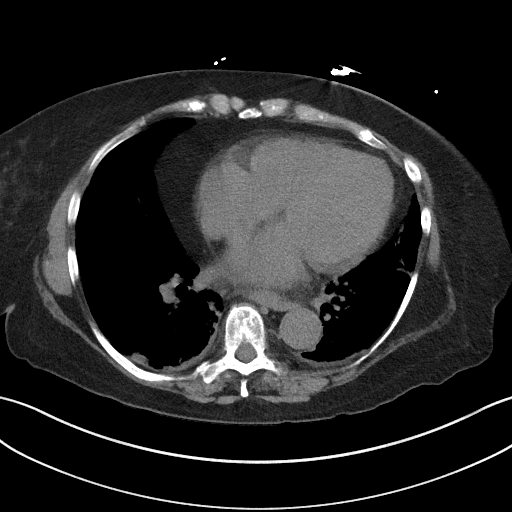

[Series 5: coronal st · coronal · 0.71mm/px · 3 of 100 slices shown]
[im 34/100  soft-tissue]
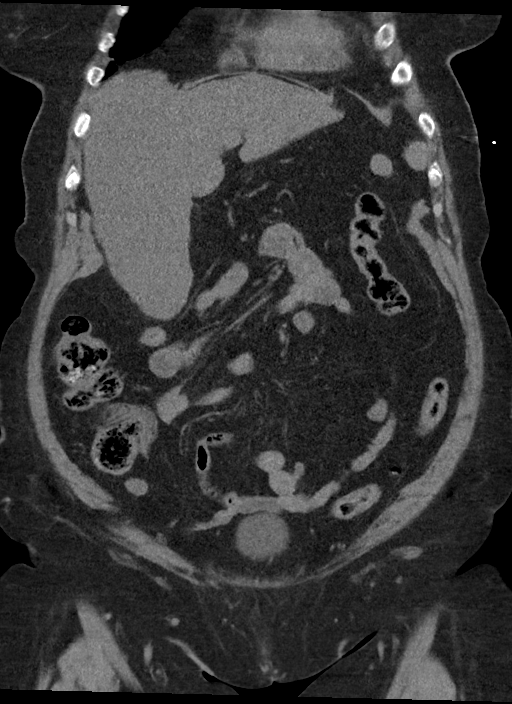
[im 45/100  soft-tissue]
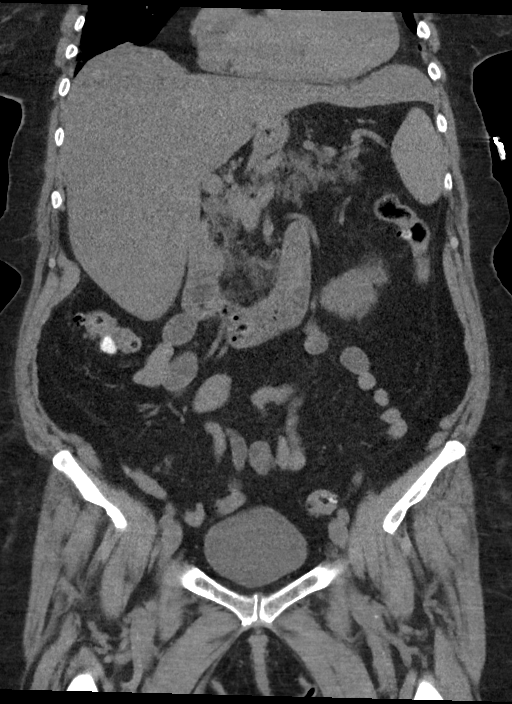
[im 56/100  soft-tissue]
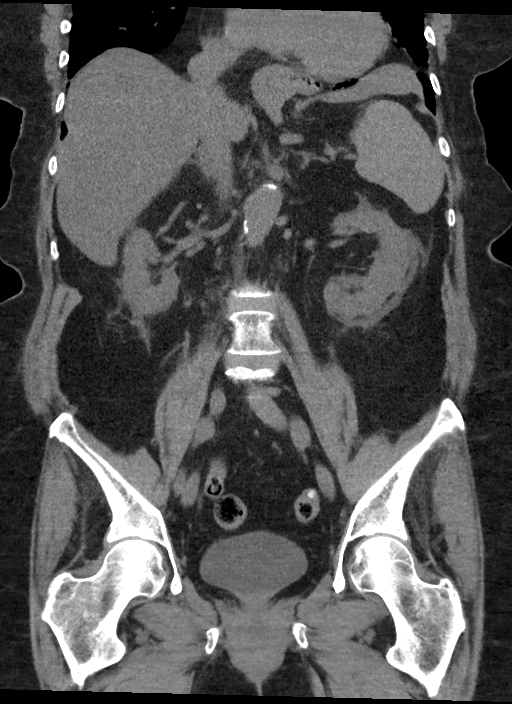

[15 of 46 positions shown; findings below may reference images not displayed]

FINDINGS: Lower chest: Lung bases demonstrate bronchiectasis in the lower
lobes and mild subpleural scarring and fibrosis. No acute
consolidations or pleural effusion. Cardiomegaly. Large hiatal
hernia.

Hepatobiliary: No focal liver abnormality is seen. Status post
cholecystectomy. No biliary dilatation.

Pancreas: Atrophic and fatty replaced. No definitive inflammatory
change or ductal dilatation

Spleen: Accessory splenule.  Upper normal in size.

Adrenals/Urinary Tract: Adrenal glands are normal. No
hydronephrosis. New left greater than right perinephric fat
stranding and fluid. Bladder is unremarkable

Stomach/Bowel: Stomach is nondilated. No dilated small bowel.
Negative appendix. Diffuse diverticular disease of the colon without
acute inflammatory change

Vascular/Lymphatic: Scattered aortic atherosclerosis. No aneurysm.
No significantly enlarged lymph nodes

Reproductive: Status post hysterectomy. No adnexal masses.

Other: Negative for free air or free fluid

Musculoskeletal: Chronic compression deformity at L5. No acute or
suspicious osseous abnormality.
IMPRESSION: 1. Interim finding of left greater than right perinephric fat
stranding and fluid, nonspecific and suggests medical renal disease,
consider pyelonephritis in the appropriate clinical setting. No
hydronephrosis or stone disease is visualized.
2. Large hiatal hernia.
3. Diffuse diverticular disease of the colon without acute
inflammatory process.
4. Cardiomegaly. Bilateral lower lobe bronchiectasis and mild
subpleural fibrosis and scarring

## 2020-12-08 IMAGING — DX DG CHEST 1V PORT
1 series · 1 of 1 positions shown · non-contrast
Comparison: 05/03/2018 radiograph

CLINICAL DATA: Acute fever and chills.

EXAM:
PORTABLE CHEST 1 VIEW

[chest ap]
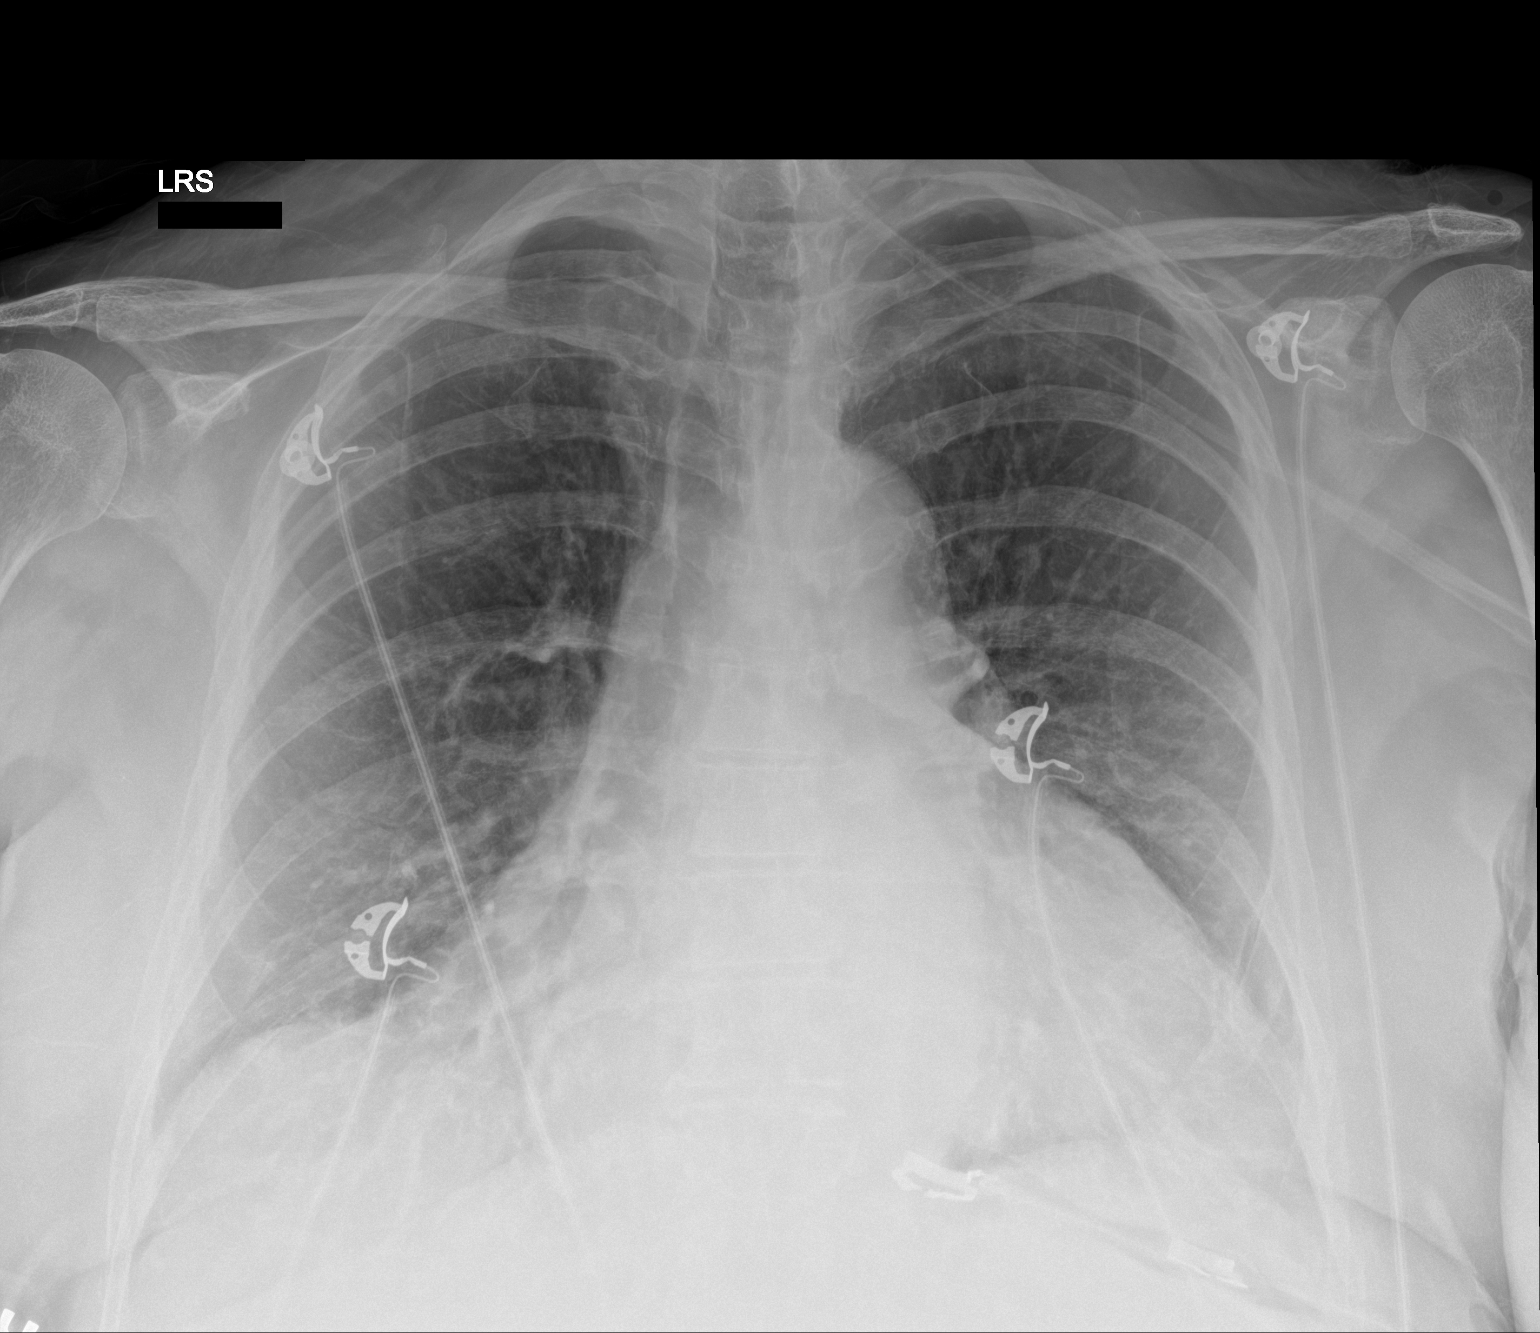

[1 of 1 positions shown; findings below may reference images not displayed]

FINDINGS: Cardiomegaly and mild peribronchial thickening again noted.

There is no evidence of focal airspace disease, pulmonary edema,
suspicious pulmonary nodule/mass, pleural effusion, or pneumothorax.

No acute bony abnormalities are identified.

No interval change from the prior study.
IMPRESSION: Cardiomegaly without evidence of acute cardiopulmonary disease.

## 2020-12-09 IMAGING — DX DG CHEST 1V
1 series · 1 of 1 positions shown · non-contrast
Comparison: 02/03/2019

CLINICAL DATA: Follow-up pneumonia

EXAM:
CHEST  1 VIEW

[chest ap]
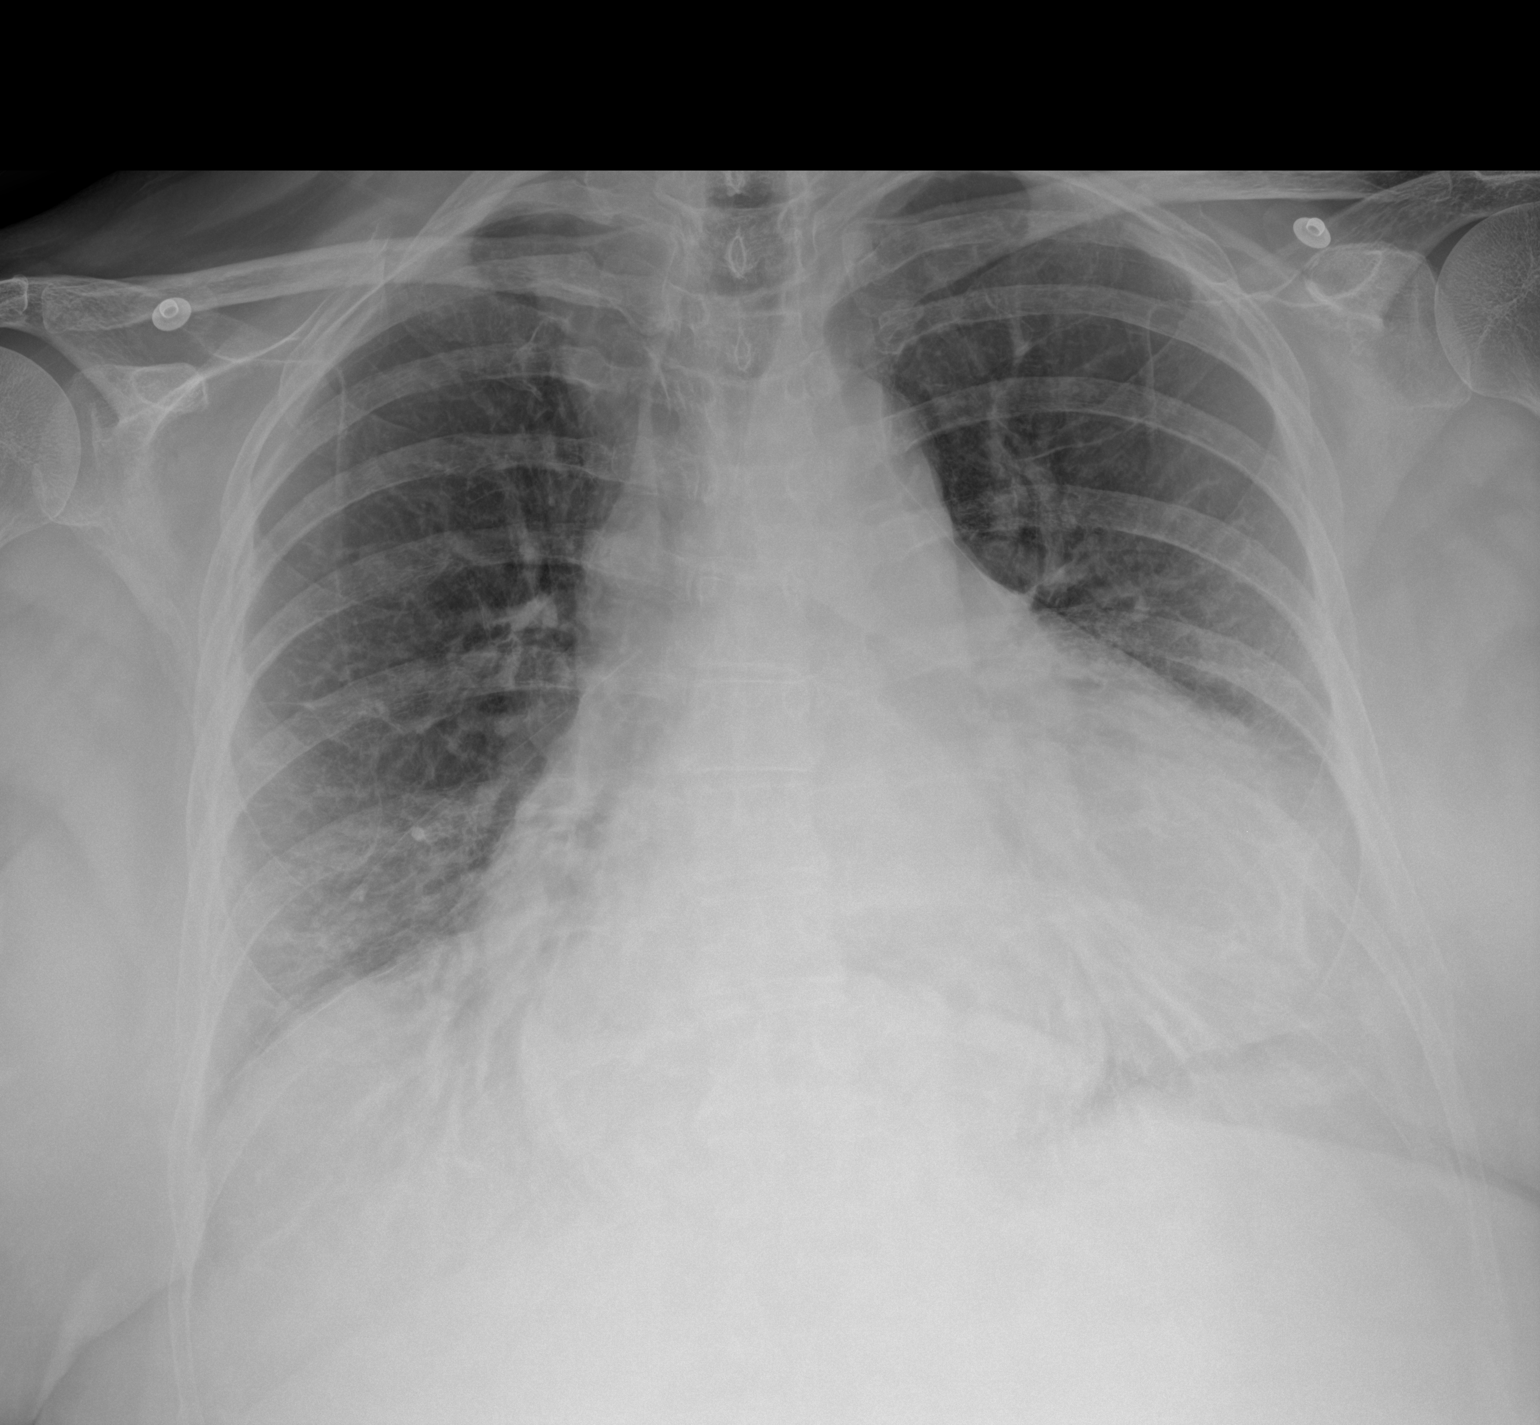

[1 of 1 positions shown; findings below may reference images not displayed]

FINDINGS: Cardiac shadow remains enlarged. Large hiatal hernia is seen. No
focal infiltrate or sizable effusion is noted. The changes of
bronchiectasis seen on the recent CT are again noted and stable. No
bony abnormality is seen.
IMPRESSION: Chronic changes without acute abnormality.

## 2022-01-15 IMAGING — DX DG CHEST 1V PORT
1 series · 1 of 1 positions shown · non-contrast
Comparison: 02/04/2019

CLINICAL DATA: Fever

EXAM:
PORTABLE CHEST 1 VIEW

[chest ap]
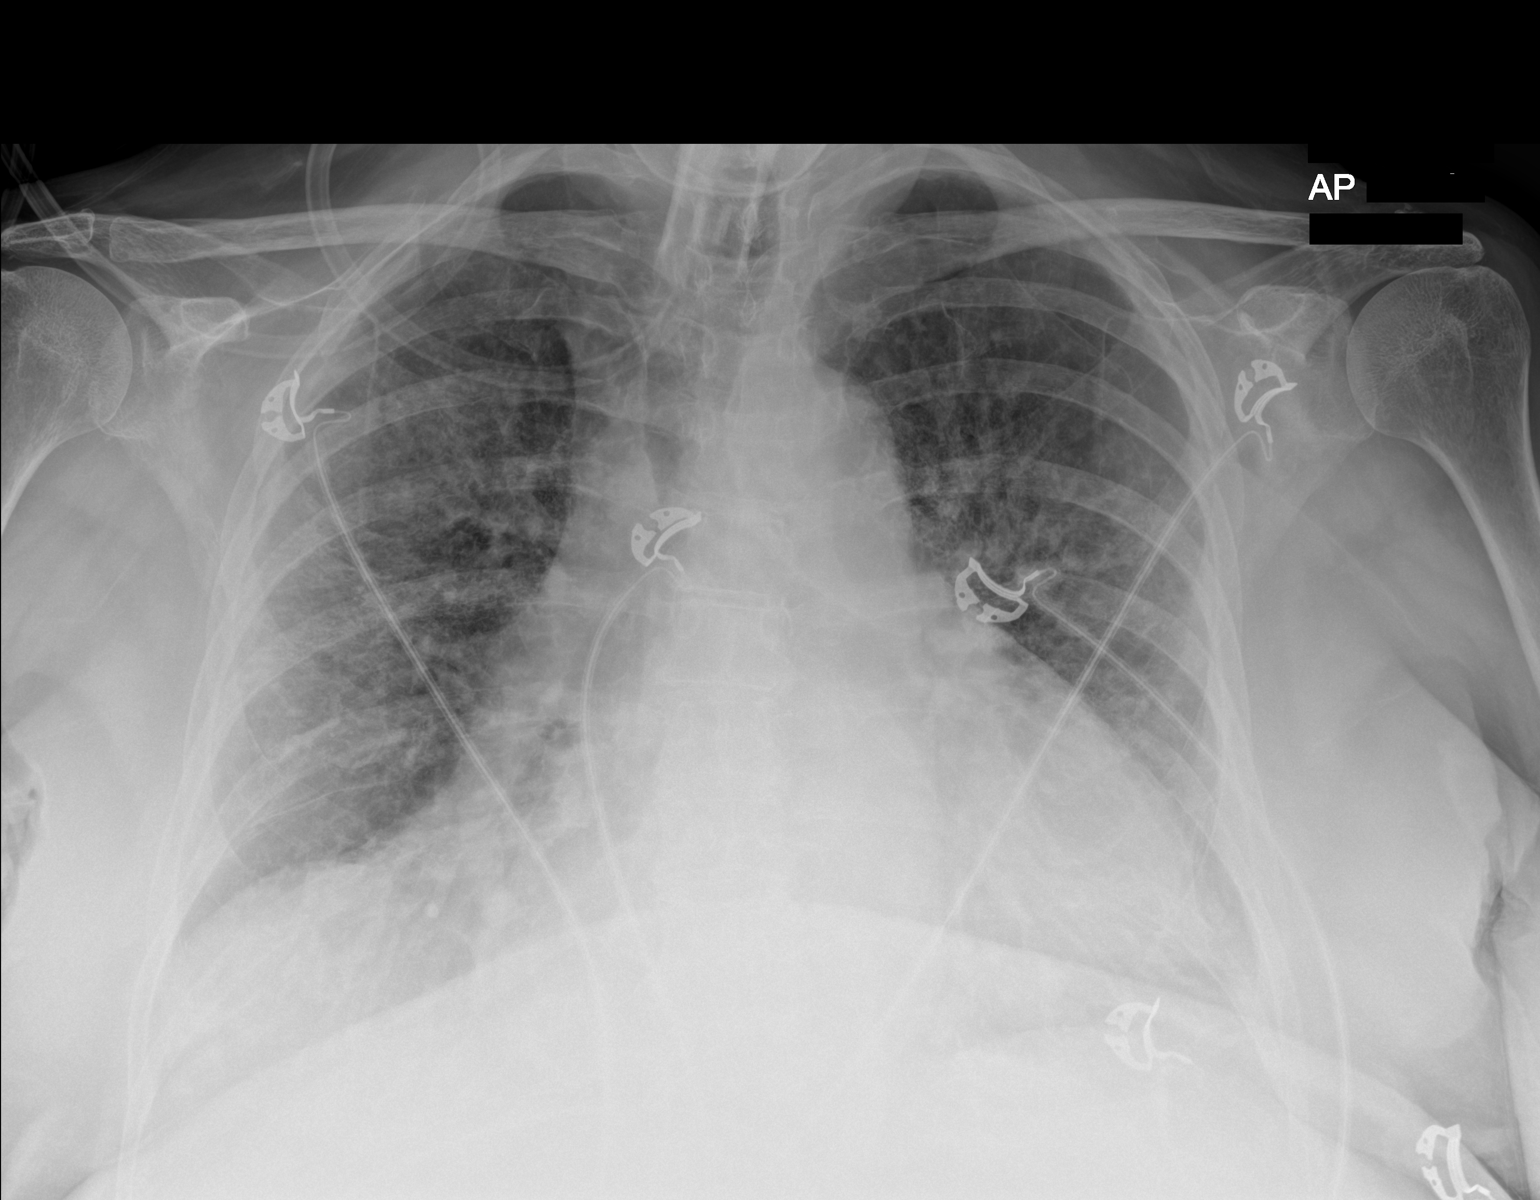

[1 of 1 positions shown; findings below may reference images not displayed]

FINDINGS: Stable cardiomegaly. Atherosclerotic calcification of the aortic
knob. Large hiatal hernia. Diffusely increased interstitial
markings throughout both lungs. No focal airspace consolidation. No
appreciable pleural fluid collection. No pneumothorax.
IMPRESSION: Diffusely increased interstitial markings throughout both lungs
which may reflect edema versus atypical/viral infection.

## 2022-01-17 IMAGING — CT CT CHEST W/O CM
2 of 3 series · 15 of 36 positions shown, 18 images · non-contrast
Comparison: November 18, 2009.

CLINICAL DATA: Shortness of breath, chest pain.

EXAM:
CT CHEST WITHOUT CONTRAST
TECHNIQUE: Multidetector CT imaging of the chest was performed following the
standard protocol without IV contrast.

[Series 2: thorax · axial · 0.65mm/px · z∈[-102,+150]mm · 12 of 148 slices shown, 15 images]
[im 11/148  mediastinal]
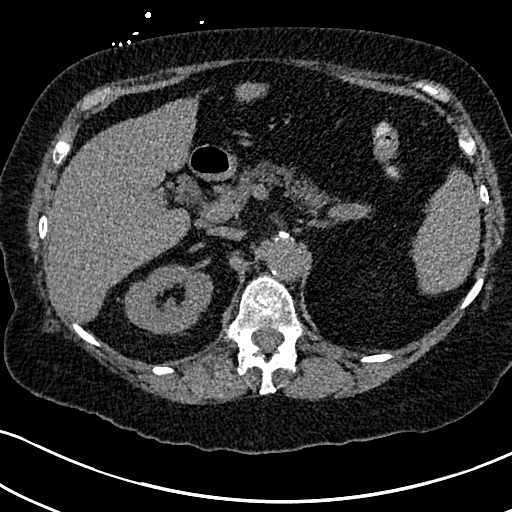
[im 11/148  lung]
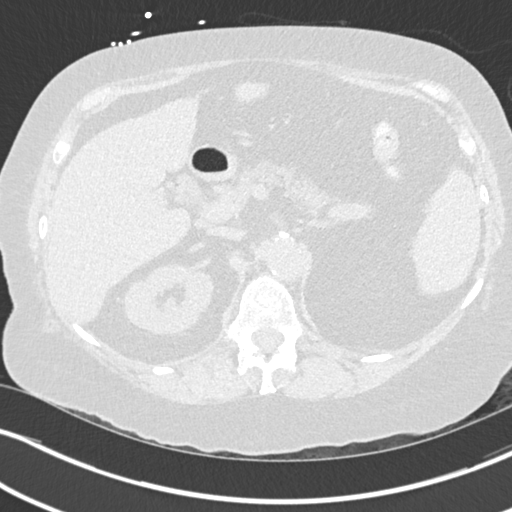
[im 22/148  lung]
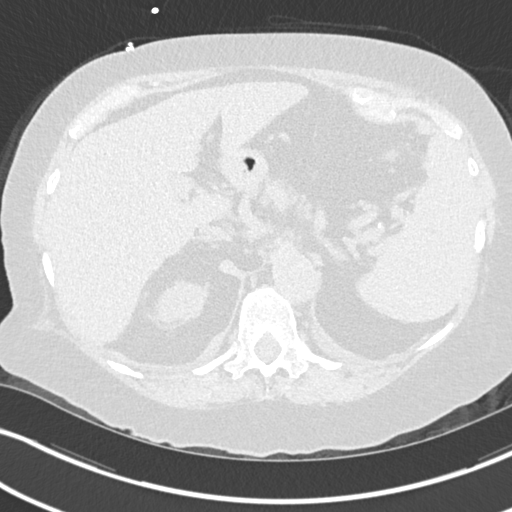
[im 33/148  lung]
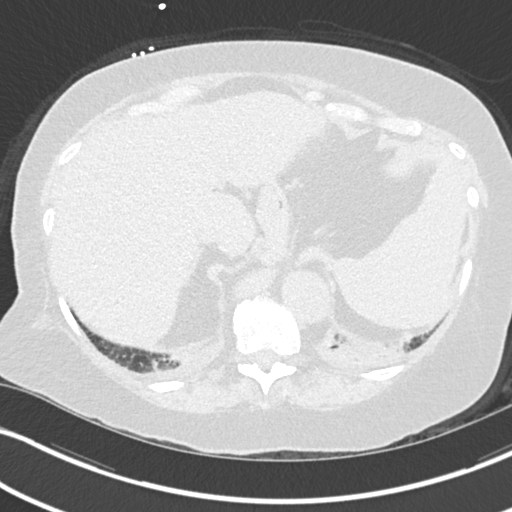
[im 44/148  lung]
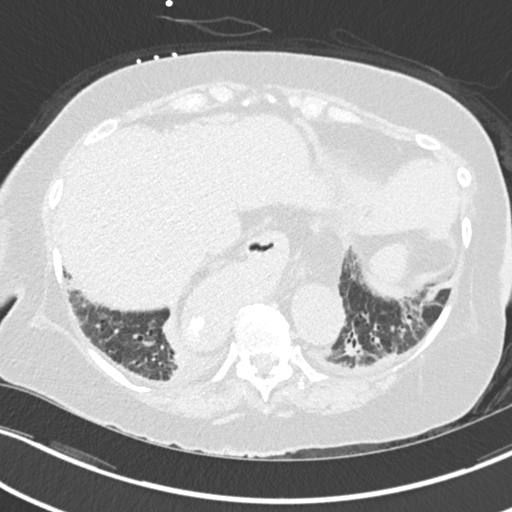
[im 55/148  mediastinal]
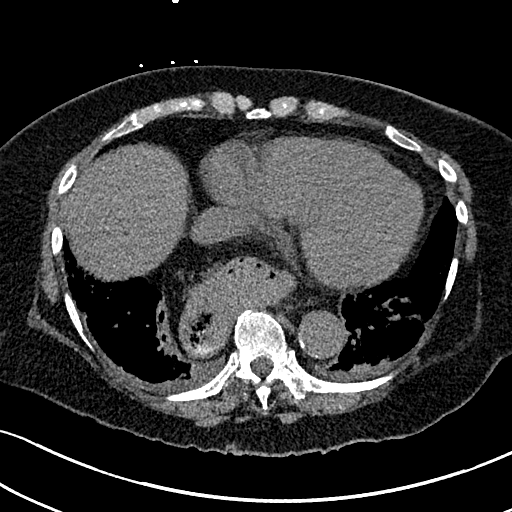
[im 55/148  lung]
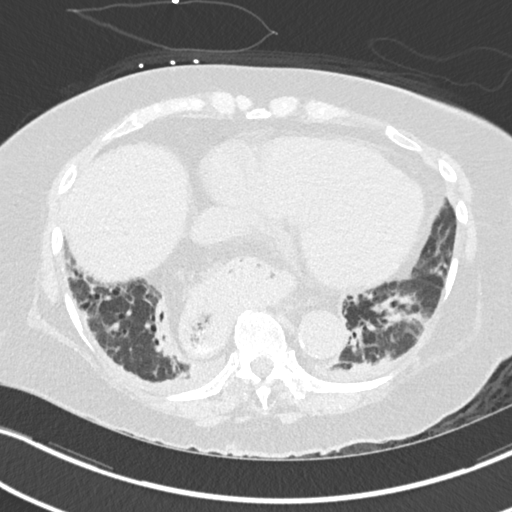
[im 66/148  lung]
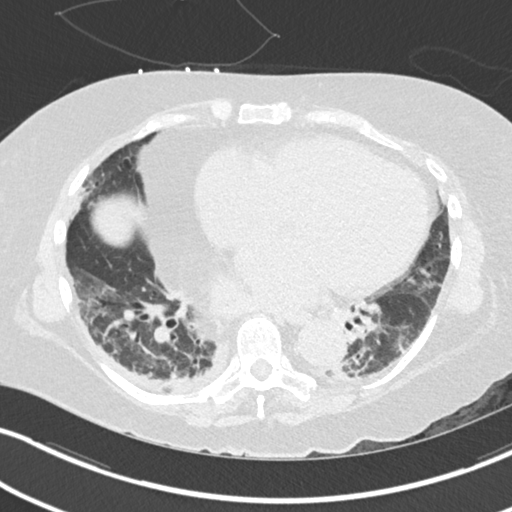
[im 82/148  lung]
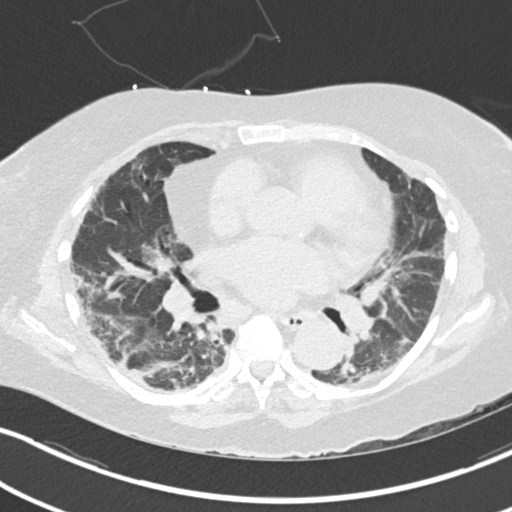
[im 93/148  lung]
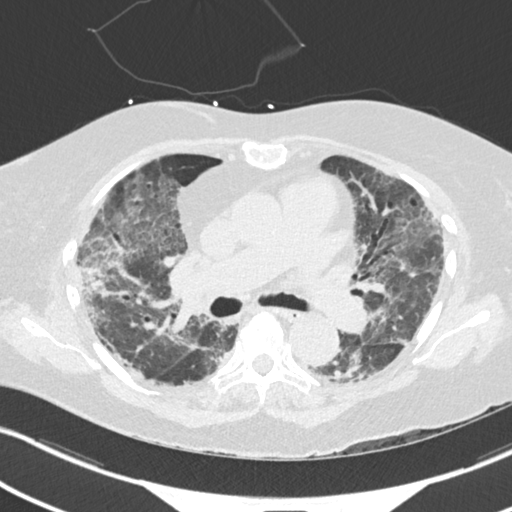
[im 104/148  mediastinal]
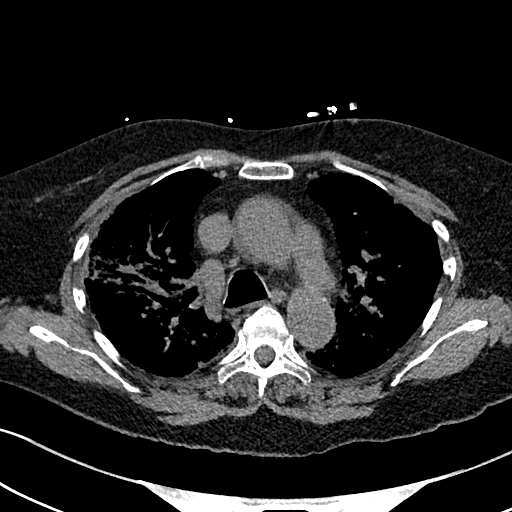
[im 104/148  lung]
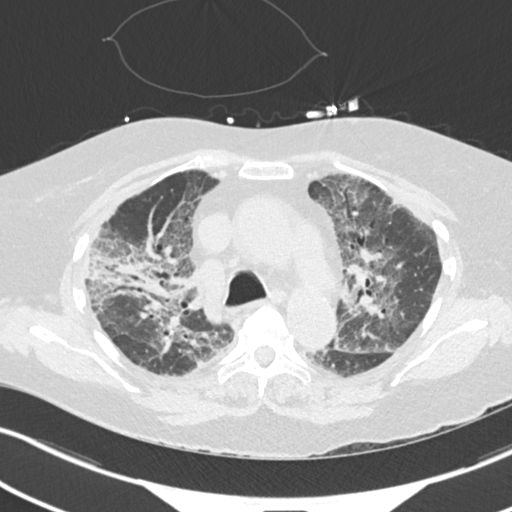
[im 115/148  lung]
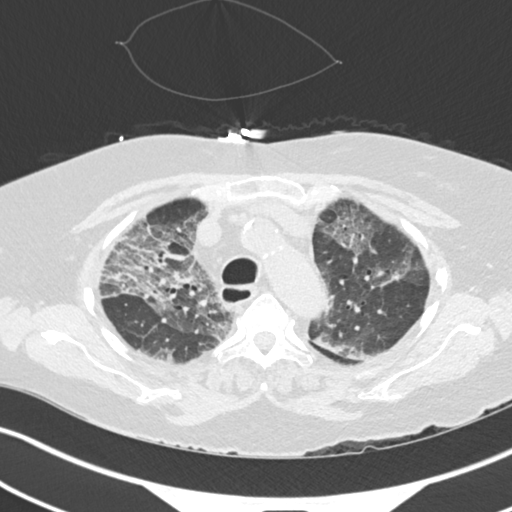
[im 126/148  lung]
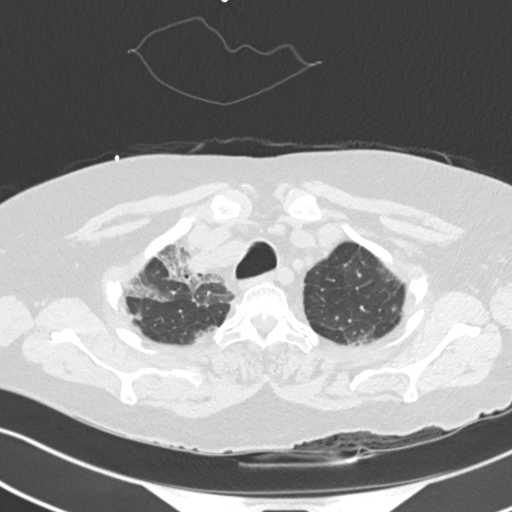
[im 137/148  lung]
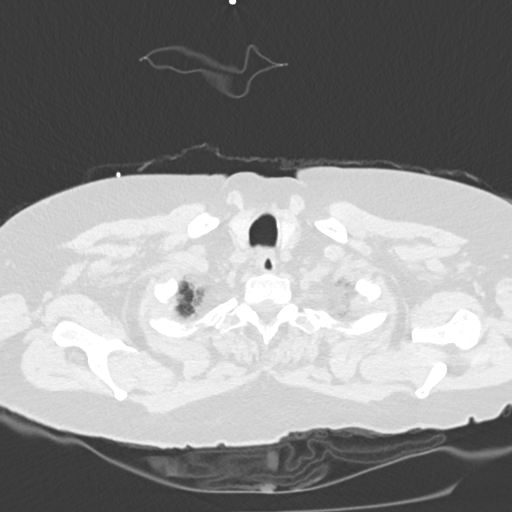

[Series 5: coronal · coronal · 0.60mm/px · 3 of 144 slices shown]
[im 29/144  lung]
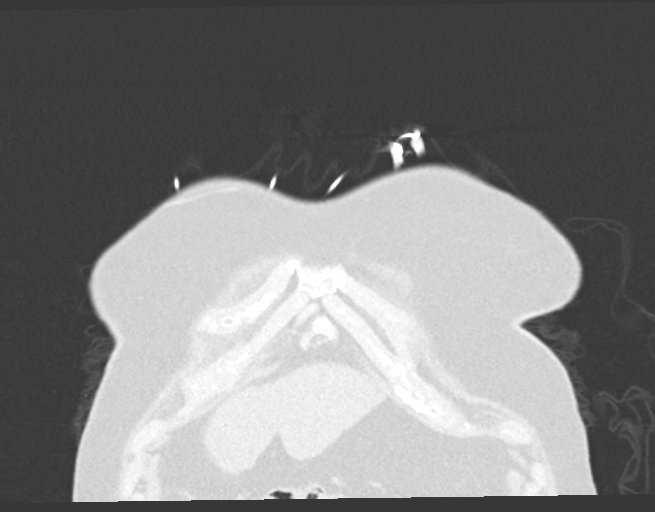
[im 58/144  lung]
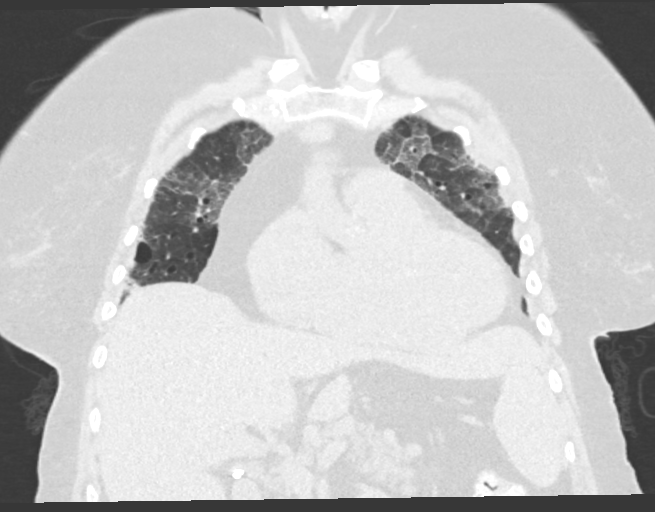
[im 86/144  lung]
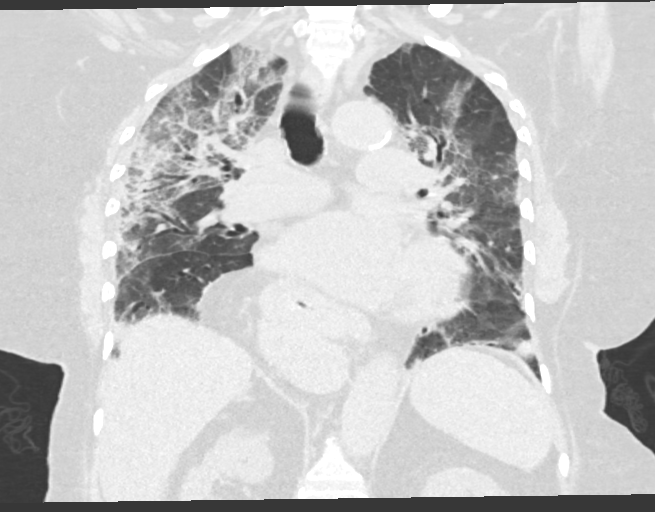

[15 of 36 positions shown; findings below may reference images not displayed]

FINDINGS: Cardiovascular: Atherosclerosis of thoracic aorta is noted without
aneurysm formation. Mild cardiomegaly is noted. No pericardial
effusion is noted.

Mediastinum/Nodes: Large sliding-type hiatal hernia is noted. No
adenopathy is noted. Thyroid gland is unremarkable.

Lungs/Pleura: No pneumothorax or pleural effusion is noted. Interval
development of patchy airspace opacities throughout both lungs
concerning for multifocal pneumonia. Bronchiectasis is noted in both
lower lobes. Mild bilateral posterior basilar subsegmental
atelectasis is noted.

Upper Abdomen: No acute abnormality.

Musculoskeletal: No chest wall mass or suspicious bone lesions
identified.
IMPRESSION: 1. Interval development of patchy airspace opacities throughout both
lungs concerning for multifocal pneumonia.
2. Bronchiectasis is noted in both lower lobes.
3. Large sliding-type hiatal hernia.
4. Aortic atherosclerosis.

Aortic Atherosclerosis ([id number]).
